# Patient Record
Sex: Male | Born: 1950
Health system: Southern US, Community
[De-identification: ages and names within clinical notes are randomized; demographics above are authoritative.]

## PROBLEM LIST (undated history)

## (undated) DIAGNOSIS — I428 Other cardiomyopathies: Secondary | ICD-10-CM

## (undated) DIAGNOSIS — R42 Dizziness and giddiness: Secondary | ICD-10-CM

## (undated) DIAGNOSIS — E119 Type 2 diabetes mellitus without complications: Secondary | ICD-10-CM

## (undated) DIAGNOSIS — E7800 Pure hypercholesterolemia, unspecified: Secondary | ICD-10-CM

## (undated) DIAGNOSIS — R011 Cardiac murmur, unspecified: Secondary | ICD-10-CM

## (undated) DIAGNOSIS — G709 Myoneural disorder, unspecified: Secondary | ICD-10-CM

## (undated) DIAGNOSIS — Z974 Presence of external hearing-aid: Secondary | ICD-10-CM

## (undated) DIAGNOSIS — Z87442 Personal history of urinary calculi: Secondary | ICD-10-CM

## (undated) DIAGNOSIS — N23 Unspecified renal colic: Secondary | ICD-10-CM

## (undated) DIAGNOSIS — I509 Heart failure, unspecified: Secondary | ICD-10-CM

## (undated) DIAGNOSIS — K219 Gastro-esophageal reflux disease without esophagitis: Secondary | ICD-10-CM

## (undated) DIAGNOSIS — K602 Anal fissure, unspecified: Secondary | ICD-10-CM

## (undated) DIAGNOSIS — G629 Polyneuropathy, unspecified: Secondary | ICD-10-CM

## (undated) DIAGNOSIS — N200 Calculus of kidney: Secondary | ICD-10-CM

## (undated) DIAGNOSIS — K279 Peptic ulcer, site unspecified, unspecified as acute or chronic, without hemorrhage or perforation: Secondary | ICD-10-CM

## (undated) DIAGNOSIS — I219 Acute myocardial infarction, unspecified: Secondary | ICD-10-CM

## (undated) DIAGNOSIS — J189 Pneumonia, unspecified organism: Secondary | ICD-10-CM

## (undated) DIAGNOSIS — C9241 Acute promyelocytic leukemia, in remission: Secondary | ICD-10-CM

## (undated) DIAGNOSIS — I2510 Atherosclerotic heart disease of native coronary artery without angina pectoris: Secondary | ICD-10-CM

## (undated) DIAGNOSIS — Z973 Presence of spectacles and contact lenses: Secondary | ICD-10-CM

## (undated) DIAGNOSIS — T884XXA Failed or difficult intubation, initial encounter: Secondary | ICD-10-CM

## (undated) HISTORY — PX: SHOULDER SURGERY: SHX246

## (undated) HISTORY — DX: Anal fissure, unspecified: K60.2

## (undated) HISTORY — DX: Type 2 diabetes mellitus without complications: E11.9

## (undated) HISTORY — PX: ANAL FISSURE REPAIR: SHX2312

## (undated) HISTORY — PX: LUMBAR FUSION: SHX111

## (undated) HISTORY — DX: Peptic ulcer, site unspecified, unspecified as acute or chronic, without hemorrhage or perforation: K27.9

## (undated) HISTORY — DX: Calculus of kidney: N20.0

## (undated) HISTORY — PX: APPENDECTOMY: SHX54

## (undated) HISTORY — PX: TONSILLECTOMY: SUR1361

## (undated) HISTORY — PX: CHOLECYSTECTOMY: SHX55

## (undated) HISTORY — DX: Gastro-esophageal reflux disease without esophagitis: K21.9

## (undated) HISTORY — DX: Unspecified renal colic: N23

## (undated) HISTORY — PX: CERVICAL FUSION: SHX112

## (undated) HISTORY — PX: KNEE SURGERY: SHX244

## (undated) HISTORY — PX: CATARACT EXTRACTION, BILATERAL: SHX1313

---

## 2007-01-08 ENCOUNTER — Ambulatory Visit (HOSPITAL_COMMUNITY): Admission: RE | Admit: 2007-01-08 | Discharge: 2007-01-08 | Payer: Self-pay | Admitting: Orthopedic Surgery

## 2008-06-28 ENCOUNTER — Ambulatory Visit (HOSPITAL_COMMUNITY): Admission: RE | Admit: 2008-06-28 | Discharge: 2008-06-28 | Payer: Self-pay | Admitting: Orthopedic Surgery

## 2009-01-02 ENCOUNTER — Encounter: Payer: Self-pay | Admitting: Cardiology

## 2009-01-04 ENCOUNTER — Encounter: Payer: Self-pay | Admitting: Cardiology

## 2009-04-20 ENCOUNTER — Ambulatory Visit (HOSPITAL_COMMUNITY): Admission: RE | Admit: 2009-04-20 | Discharge: 2009-04-20 | Payer: Self-pay | Admitting: Orthopedic Surgery

## 2009-11-14 ENCOUNTER — Encounter: Payer: Self-pay | Admitting: Cardiology

## 2009-11-14 ENCOUNTER — Ambulatory Visit: Payer: Self-pay | Admitting: Cardiology

## 2009-11-15 ENCOUNTER — Encounter: Payer: Self-pay | Admitting: Cardiology

## 2009-11-26 ENCOUNTER — Encounter: Payer: Self-pay | Admitting: Cardiology

## 2009-12-04 ENCOUNTER — Encounter: Payer: Self-pay | Admitting: Cardiology

## 2010-01-13 ENCOUNTER — Encounter: Payer: Self-pay | Admitting: Cardiology

## 2010-01-13 DIAGNOSIS — I10 Essential (primary) hypertension: Secondary | ICD-10-CM | POA: Insufficient documentation

## 2010-01-13 DIAGNOSIS — R079 Chest pain, unspecified: Secondary | ICD-10-CM | POA: Insufficient documentation

## 2010-01-14 ENCOUNTER — Ambulatory Visit: Payer: Self-pay | Admitting: Cardiology

## 2010-01-14 DIAGNOSIS — Z87891 Personal history of nicotine dependence: Secondary | ICD-10-CM | POA: Insufficient documentation

## 2010-09-10 NOTE — Miscellaneous (Signed)
  Clinical Lists Changes  Observations: Added new observation of CARDIO HPI: See hospital consult note and D/C summary [place]. His symptoms were GI. But, he is diabetic . Echo questions slight anterior wall abnormality. I will see him in office to follow further. (11/15/2009 15:28)      History of Present Illness: See hospital consult note and D/C summary [place]. His symptoms were GI. But, he is diabetic . Echo questions slight anterior wall abnormality. I will see him in office to follow further.

## 2010-09-10 NOTE — Miscellaneous (Signed)
  Clinical Lists Changes  Problems: Added new problem of HYPERTENSION (ICD-401.9) Added new problem of CHEST PAIN (ICD-786.50) Observations: Added new observation of PAST MED HX: Hypertension Annal fissure repair Lumbar fusion Ureteral colic Chest pain...[facility]..11/1999...mild amylase/lipase elevation...symptoms GI EF  55%..echo...11/14/2009...slight relative hypokinesis of the anterior wall. ?  prior outside echo 2010 reported as normal motion Nuclear stress.Marland KitchenMarland KitchenVyas office.Marland KitchenMarland Kitchen5/27/10...normal..  Shelva Majestic) (01/13/2010 13:57) Added new observation of PRIMARY MD: Doreen Beam, MD (01/13/2010 13:57)       Past History:  Past Medical History: Hypertension Annal fissure repair Lumbar fusion Ureteral colic Chest pain...[facility]..11/1999...mild amylase/lipase elevation...symptoms GI EF  55%..echo...11/14/2009...slight relative hypokinesis of the anterior wall. ?  prior outside echo 2010 reported as normal motion Nuclear stress.Marland KitchenMarland KitchenVyas office.Marland KitchenMarland Kitchen5/27/10...normal..  ([place])

## 2010-09-10 NOTE — Assessment & Plan Note (Signed)
Summary: eph-post [facility] 2 mo fu per Myrtis Ser   Visit Type:  hospital follow-up Primary Provider:  Doreen Beam, MD  CC:  chest faint.  History of Present Illness: The patient is seen for followup chest pain.  I have seen him in the hospital November 14, 2009.  At that time he had some chest discomfort and indigestion.  He had slight amylase rise.  He did not have definite gallstones.  However it turns out that he did have an abnormal HIDA scan and eventually had a cholecystectomy.  He feels fine has had no recurrent symptoms..  I was aware at the time of the consultation that he had an echo and nuclear studies in Dr.Vyas's office in the past. I received copies of these studies.  In May 2010 he had a normal echo and a normal nuclear stress study.  In the hospital recently I questioned whether he can have very slight relative anterior hypokinesis.  This was a borderline call.  Preventive Screening-Counseling & Management  Alcohol-Tobacco     Smoking Status: quit     Year Quit: 1987  Current Medications (verified): 1)  Metformin Hcl 500 Mg Tabs (Metformin Hcl) .... Take Two By Mouth in Morning and On in Evening 2)  Colace 100 Mg Caps (Docusate Sodium) .... Take 1 Tablet By Mouth Once A Day  Allergies (verified): 1)  ! Claritin  Comments:  Nurse/Medical Assistant: The patient's medications and allergies were verabally reviewed with the patient and were updated in the Medication and Allergy Lists.  Past History:  Past Medical History: Hypertension Annal fissure repair Lumbar fusion Ureteral colic Chest pain...[facility]..11/1999...mild amylase/lipase elevation...symptoms GI EF  echo.... May, 2010.Marland KitchenMarland KitchenMarland KitchenVyas's office.. normal  /     55%..echo...11/14/2009...slight relative hypokinesis of the anterior wall. Nuclear stress.Marland KitchenMarland KitchenVyas office.Marland KitchenMarland Kitchen5/27/10...normal..  ([place])    Social History: Smoking Status:  quit  Review of Systems       Patient denies fever, chills, headache, sweats, rash, change in vision,  change in hearing, chest pain, cough, nausea vomiting, urinary symptoms.  All other systems are reviewed and are negative.  Vital Signs:  Patient profile:   60 year old male Height:      69 inches Weight:      220 pounds BMI:     32.61 Pulse rate:   102 / minute BP sitting:   159 / 94  (left arm) Cuff size:   large  Vitals Entered By: Carlye Grippe (January 14, 2010 1:08 PM)  Nutrition Counseling: Patient's BMI is greater than 25 and therefore counseled on weight management options.   Physical Exam  General:  patient is stable. Eyes:  no xanthelasma. Neck:  no jugular venous distention. Lungs:  lungs are clear.  Respiratory effort is nonlabored. Heart:  cardiac exam reveals S1-S2.  No clicks or significant murmurs. Abdomen:  abdomen soft. Msk:  no musculoskeletal qualities. Extremities:  no peripheral edema. Skin:  no skin rashes. Psych:  patient is oriented to person time and place.  Affect is normal.   Impression & Recommendations:  Problem # 1:  CHEST PAIN (ICD-786.50) The symptoms that the patient had were related to his gallbladder.  He is quite stable now.  I have reviewed his prior echo and nuclear reports from 2011.  The echo in the hospital only suggested a slim possibility of relative anterior hypokinesis.  I feel he does not need any further workup now.  I do plan to see him back in one year for followup.  If he develops  any recurrent symptoms more aggressive evaluation will be needed.  Problem # 2:  * CHOLECYSTECTOMY... APRIL, 2011 The patient did have his gallbladder surgery and he feels much better.  Problem # 3:  HYPERTENSION (ICD-401.9) Blood pressure is elevated today.  Patient rushed to get here.  He has a blood pressure cuff at home.  At home usually his pressure is under good control.  He will check his pressure several more times at home.  If there is any elevation he will be back in touch with me or Dr.Vyas.  Patient Instructions: 1)  Your physician  wants you to follow-up in: 1 year. You will receive a reminder letter in the mail one-two months in advance. If you don't receive a letter, please call our office to schedule the follow-up appointment. 2)  Your physician recommends that you continue on your current medications as directed. Please refer to the Current Medication list given to you today.

## 2010-09-13 NOTE — Consult Note (Signed)
Summary: CARDIOLOGY CONSULT/ [facility]  CARDIOLOGY CONSULT/ [facility]   Imported By: Zachary George 01/11/2010 09:58:23  _____________________________________________________________________  External Attachment:    Type:   Image     Comment:   External Document

## 2010-09-13 NOTE — Letter (Signed)
Summary: [facility] D/C DR. VYAS  [facility] D/C DR. [facility]   Imported By: Zachary George 01/14/2010 10:33:13  _____________________________________________________________________  External Attachment:    Type:   Image     Comment:   External Document

## 2010-11-15 LAB — COMPREHENSIVE METABOLIC PANEL
ALT: 26 U/L (ref 0–53)
AST: 18 U/L (ref 0–37)
Albumin: 4.4 g/dL (ref 3.5–5.2)
Alkaline Phosphatase: 61 U/L (ref 39–117)
BUN: 11 mg/dL (ref 6–23)
CO2: 24 mEq/L (ref 19–32)
Calcium: 9.7 mg/dL (ref 8.4–10.5)
Chloride: 110 mEq/L (ref 96–112)
Creatinine, Ser: 0.81 mg/dL (ref 0.4–1.5)
GFR calc Af Amer: >60 mL/min (ref >60)
GFR calc non Af Amer: >60 mL/min (ref >60)
Glucose, Bld: 114 mg/dL — ABNORMAL HIGH (ref 70–99)
Potassium: 4.9 mEq/L (ref 3.5–5.1)
Sodium: 140 mEq/L (ref 135–145)
Total Bilirubin: 0.7 mg/dL (ref 0.3–1.2)
Total Protein: 6.8 g/dL (ref 6.0–8.3)

## 2010-11-15 LAB — CBC
HCT: 46.4 % (ref 39.0–52.0)
Hemoglobin: 16.3 g/dL (ref 13.0–17.0)
MCHC: 35.1 g/dL (ref 30.0–36.0)
MCV: 95.7 fL (ref 78.0–100.0)
Platelets: 260 10*3/uL (ref 150–400)
RBC: 4.85 MIL/uL (ref 4.22–5.81)
RDW: 12.8 % (ref 11.5–15.5)
WBC: 7.3 10*3/uL (ref 4.0–10.5)

## 2010-11-15 LAB — GLUCOSE, CAPILLARY
Glucose-Capillary: 145 mg/dL — ABNORMAL HIGH (ref 70–99)
Glucose-Capillary: 147 mg/dL — ABNORMAL HIGH (ref 70–99)

## 2010-12-24 NOTE — Op Note (Signed)
NAME:  Ricardo Zuniga, Ricardo Zuniga              ACCOUNT NO.:  [id number]   MEDICAL RECORD NO.:  [id number]          PATIENT TYPE:  AMB   LOCATION:  SDS                          FACILITY:  [facility]   PHYSICIAN:  Dyke Brackett, M.D.    DATE OF BIRTH:  [birth year]   DATE OF PROCEDURE:  06/28/2008  DATE OF DISCHARGE:  06/28/2008                               OPERATIVE REPORT   INDICATIONS:  This is a 60 year old injured on the job with a work-  related injury to his left shoulder with a complete rotator cuff tear  thought to be amenable to start outpatient surgery in this patient's  setting due to difficulty intubation.   PREOPERATIVE DIAGNOSES:  1. Complete supraspinatus full thickness, full width tear.  2. Impingement.  3. Acromioclavicular joint arthritis.  4. Degenerative tearing of anterosuperior labrum.   POSTOPERATIVE DIAGNOSES:  1. Complete supraspinatus full thickness, full width tear.  2. Impingement.  3. Acromioclavicular joint arthritis.  4. Degenerative tearing of anterosuperior labrum.   OPERATION:  1. Open rotator cuff repair, acromioplasty.  2. Arthroscopic debridement of torn labrum.  3. Open distal clavicle excision.   SURGEON:  Dyke Brackett, MD   ASSISTANTKatrinka Blazing, PA   BLOOD LOSS:  Minimal.   DESCRIPTION OF PROCEDURE:  Sterile prep and drape, general anesthetic  with the preoperative supraclavicular block, posterior and anterior  portals were created.  Intraarticularly, he did actually have some  degenerative change of the glenohumeral joint mild, just debrided.  Labrum degenerative tearing anterosuperior and anteroinferior labrum  debrided.  This was done arthroscopically.  Large rotator cuff tear was  identified.  Biceps tendon was difficult to identify its origin into the  labrum, but there was a biceps tendon at least visible once we opened  the shoulder.  Procedure was next converted to open procedure.  Incision  made bisecting the acromial AC interval,  degenerative distal clavicle  excised over about a centimeter distally, acromioplasty carried out,  sectioned about 6-7 mm of the anterior leading edge of the acromion  revealing a very thickened hypertrophied bursa, which was excised.  Complete cuff tear noted with relatively healthy tissue.  Debridement of  the edges of the tissue was carried out followed by burring of the bony  surfaces inserting a 2 Arthrex 5.5 corkscrew anchors for a total of 4  sutures repair of the cuff  tear.  Since the watertight repair was obtained, closure of the deltoid  was repaired with #1 Ethibond, 0 Vicryl in subcutaneous tissues, 2-0  Vicryl, brace already done, a mini pillow, sling splint.  Taken to  recovery room in stable condition.      Dyke Brackett, M.D.  Electronically Signed     WDC/MEDQ  D:  06/28/2008  T:  06/29/2008  Job:  [id number]

## 2010-12-24 NOTE — Op Note (Signed)
NAME:  RYDELL, WIEGEL              ACCOUNT NO.:  [id number]   MEDICAL RECORD NO.:  [id number]          PATIENT TYPE:  AMB   LOCATION:  SDS                          FACILITY:  [facility]   PHYSICIAN:  Dyke Brackett, M.D.    DATE OF BIRTH:  1951/05/06   DATE OF PROCEDURE:  DATE OF DISCHARGE:                               OPERATIVE REPORT   Audio too short to transcribe (less than 5 seconds)      Dyke Brackett, M.D.     WDC/MEDQ  D:  01/08/2007  T:  01/08/2007  Job:  [id number]

## 2010-12-24 NOTE — Op Note (Signed)
NAME:  ATREUS, HASZ              ACCOUNT NO.:  [id number]   MEDICAL RECORD NO.:  [id number]          PATIENT TYPE:  AMB   LOCATION:  SDS                          FACILITY:  [facility]   PHYSICIAN:  Dyke Brackett, M.D.    DATE OF BIRTH:  August 09, 1951   DATE OF PROCEDURE:  01/08/2007  DATE OF DISCHARGE:                               OPERATIVE REPORT   PREOPERATIVE DIAGNOSIS:  Rotator cuff tear.   POSTOPERATIVE DIAGNOSES:  1. Partial rotator cuff tear.  2. Labral tear.  3. Partial biceps tendon tear.  4. Impingement.  5. Acromioclavicular joint arthritis.   OPERATION:  1. Arthroscopic acromioplasty.  2. Arthroscopic debridement of torn labrum.  3. Arthroscopic incision of distal clavicle.   SURGEON:  Dyke Brackett, M.D.   ASSISTANT:  Arlys John D. Petrarca, P.A.-C.   ANESTHESIA:  General (awake intubation).   BLOOD LOSS:  Minimal.   INDICATIONS:  This is a 60 year old, injured on the job with a resultant  painful right shoulder.  MRI suggested a complete cuff tear, thought to  be amenable to outpatient, possible overnight.  He was transferred from  originally being done at the surgery center, where intubation was not  possible, to an inpatient hospital setting due to the fact that he could  not be intubated as an outpatient.   DESCRIPTION OF PROCEDURE:  Awake intubation was performed  fiberoptically.  After that the patient was prepped in the beach-chair  position.  He was arthroscoped through a posterolateral and anterior  superior portal.  Intra-articularly, he had significant tearing of the  anterior superior labrum, which was debrided.  This was not a tear  amenable to repair.  He had a partial biceps tendon tear under the  undersurface of the biceps, and probably the majority of the intra-  articular length of the biceps, which was debrided, estimated to be  about 30% of the diameter of the biceps.  The rotator cuff showed a lot  of tendinosis, but no frank tear was  appreciated on the undersurface, or  superiorly above in the bursa.  The bursa was hypertrophied and  thickened with moderate impingement from the CA ligament and the  acromion, but a lot of impingement from the clavicle.  The superior  surface of the cuff was probed and seen to be intact.  Complete  bursectomy was carried out.  Resection of the distal 1 cm to 1.5 cm of  the distal clavicle with an acromioplasty was carried out.  Complete  bursectomy was carried out.  This relieved the  impingement nicely.  No full-thickness tear appreciated.  The portals  were closed with nylon, and the shoulder infiltrated with Marcaine 20 to  30 mL into the portals, as well as intra-articularly in the joint.  A  lightly compressive sterile dressing and sling applied.  Taken to  recovery room in stable condition.      Dyke Brackett, M.D.  Electronically Signed     WDC/MEDQ  D:  01/08/2007  T:  01/08/2007  Job:  [id number]

## 2010-12-24 NOTE — Op Note (Signed)
NAME:  Ricardo Zuniga, Ricardo Zuniga              ACCOUNT NO.:  [id number]   MEDICAL RECORD NO.:  [id number]          PATIENT TYPE:  AMB   LOCATION:  SDS                          FACILITY:  [facility]   PHYSICIAN:  Dyke Brackett, M.D.    DATE OF BIRTH:  1950/11/23   DATE OF PROCEDURE:  DATE OF DISCHARGE:                               OPERATIVE REPORT   Audio too short to transcribe (less than 5 seconds)      Dyke Brackett, M.D.     WDC/MEDQ  D:  01/08/2007  T:  01/08/2007  Job:  [id number]

## 2011-05-13 LAB — CBC
HCT: 47.7
Hemoglobin: 16.6
MCHC: 34.8
MCV: 94.6
Platelets: 280
RBC: 5.04
RDW: 12.9
WBC: 7.4

## 2011-05-13 LAB — APTT: aPTT: 25

## 2011-05-13 LAB — DIFFERENTIAL
Basophils Absolute: 0
Basophils Relative: 1
Eosinophils Absolute: 0.1
Eosinophils Relative: 1
Lymphocytes Relative: 25
Lymphs Abs: 1.8
Monocytes Absolute: 0.6
Monocytes Relative: 8
Neutro Abs: 4.9
Neutrophils Relative %: 66

## 2011-05-13 LAB — PROTIME-INR
INR: 1.1
Prothrombin Time: 14.5

## 2012-02-05 ENCOUNTER — Encounter: Payer: Self-pay | Admitting: *Deleted

## 2012-06-14 ENCOUNTER — Encounter: Payer: Self-pay | Admitting: Cardiology

## 2012-07-01 ENCOUNTER — Encounter: Payer: Self-pay | Admitting: *Deleted

## 2012-07-01 ENCOUNTER — Ambulatory Visit (INDEPENDENT_AMBULATORY_CARE_PROVIDER_SITE_OTHER): Payer: BC Managed Care – PPO | Admitting: Cardiology

## 2012-07-01 ENCOUNTER — Encounter: Payer: Self-pay | Admitting: Cardiology

## 2012-07-01 VITALS — BP 145/94 | HR 107 | Ht 69.0 in | Wt 224.0 lb

## 2012-07-01 DIAGNOSIS — R0609 Other forms of dyspnea: Secondary | ICD-10-CM

## 2012-07-01 DIAGNOSIS — E785 Hyperlipidemia, unspecified: Secondary | ICD-10-CM | POA: Insufficient documentation

## 2012-07-01 DIAGNOSIS — I351 Nonrheumatic aortic (valve) insufficiency: Secondary | ICD-10-CM | POA: Insufficient documentation

## 2012-07-01 DIAGNOSIS — R0989 Other specified symptoms and signs involving the circulatory and respiratory systems: Secondary | ICD-10-CM

## 2012-07-01 DIAGNOSIS — R0600 Dyspnea, unspecified: Secondary | ICD-10-CM

## 2012-07-01 DIAGNOSIS — I10 Essential (primary) hypertension: Secondary | ICD-10-CM

## 2012-07-01 DIAGNOSIS — R9389 Abnormal findings on diagnostic imaging of other specified body structures: Secondary | ICD-10-CM

## 2012-07-01 DIAGNOSIS — I359 Nonrheumatic aortic valve disorder, unspecified: Secondary | ICD-10-CM

## 2012-07-01 DIAGNOSIS — R931 Abnormal findings on diagnostic imaging of heart and coronary circulation: Secondary | ICD-10-CM

## 2012-07-01 NOTE — Progress Notes (Signed)
  HPI: 61 year-old male for evaluation of abnormal echocardiogram. Last seen by Dr Myrtis Ser 2011. Myoview in 2010 showed an ejection fraction of 46% and normal perfusion. Recent echocardiogram in October of 2013 showed an ejection fraction of 50%, possible inferior posterior and apical hypokinesis, moderate aortic insufficiency and mild mitral regurgitation. We were asked to evaluate for the abnormal echo. The patient has dyspnea with more extreme activities but not routine activities. There is no orthopnea, PND, pedal edema, chest pain, palpitations, syncope or claudication.  Current Outpatient Prescriptions  Medication Sig Dispense Refill  . amoxicillin (AMOXIL) 500 MG tablet Take 500 mg by mouth 2 (two) times daily. Will finish tomorrow.      Marland Kitchen atorvastatin (LIPITOR) 10 MG tablet Take 10 mg by mouth every evening.      Marland Kitchen dexlansoprazole (DEXILANT) 60 MG capsule Take 60 mg by mouth daily.      Marland Kitchen docusate sodium (COLACE) 100 MG capsule Take 100 mg by mouth 2 (two) times daily.      Marland Kitchen glimepiride (AMARYL) 4 MG tablet Take 4 mg by mouth daily.      . Linagliptin-Metformin HCl (JENTADUETO) 2.5-500 MG TABS Take 2 tabs every morning & 1 tab every evening        Allergies  Allergen Reactions  . Loratadine     Past Medical History  Diagnosis Date  . Hypertension   . Anal fissure   . Ureteral colic   . Chest pain, unspecified 11/1999    [facility]  . Nephrolithiasis   . Gastroesophageal reflux disease   . Peptic ulcer disease     Past Surgical History  Procedure Date  . Anal fissure repair   . Lumbar fusion   . Cholecystectomy     History   Social History  . Marital Status: Married    Spouse Name: N/A    Number of Children: N/A  . Years of Education: N/A   Occupational History  . RETIRED     [name]  . SECURITY GUARD    Social History Main Topics  . Smoking status: Former Smoker    Quit date: 08/11/1985  . Smokeless tobacco: Not on file  . Alcohol Use: No  . Drug Use: No    . Sexually Active: Not on file   Other Topics Concern  . Not on file   Social History Narrative  . No narrative on file    No family history on file.  ROS: no fevers or chills, productive cough, hemoptysis, dysphasia, odynophagia, melena, hematochezia, dysuria, hematuria, rash, seizure activity, orthopnea, PND, pedal edema, claudication. Remaining systems are negative.  Physical Exam:   Blood pressure 145/94, pulse 107, height 5\' 9"  (1.753 m), weight 224 lb (101.606 kg), SpO2 97.00%.  General:  Well developed/well nourished in NAD Skin warm/dry Patient not depressed No peripheral clubbing Back-normal HEENT-normal/normal eyelids Neck supple/normal carotid upstroke bilaterally; no bruits; no JVD; no thyromegaly chest - CTA/ normal expansion CV - RRR/normal S1 and S2; no murmurs, rubs or gallops;  PMI nondisplaced Abdomen -NT/ND, no HSM, no mass, + bowel sounds, no bruit 2+ femoral pulses, no bruits Ext-no edema, chords, 2+ DP Neuro-grossly nonfocal  ECG sinus rhythm at a rate of 99. No ST changes.

## 2012-07-01 NOTE — Patient Instructions (Signed)
Continue all current medications. Stress Myoview - [place] Office will contact with results Your physician wants you to follow up in:  1 year.  You will receive a reminder letter in the mail one-two months in advance.  If you don't receive a letter, please call our office to schedule the follow up appointment

## 2012-07-01 NOTE — Assessment & Plan Note (Signed)
Patient's blood pressure is elevated but he states typically normal at home. We will follow this closely and add either an ACE inhibitor or ARB if his blood pressure remains elevated. I have asked him to also discuss this with his primary care physician as he would likely benefit from these medications given his diabetes mellitus.

## 2012-07-01 NOTE — Assessment & Plan Note (Signed)
Continue statin. 

## 2012-07-01 NOTE — Assessment & Plan Note (Signed)
Patient is noted to have possible wall motion abnormality on echocardiogram. Plan to proceed with stress Myoview for risk stratification and to exclude coronary disease. Note he is diabetic and has dyspnea with more extreme activities.

## 2012-07-01 NOTE — Assessment & Plan Note (Signed)
Plan followup echocardiogram in one year. 

## 2012-07-02 ENCOUNTER — Other Ambulatory Visit: Payer: Self-pay | Admitting: *Deleted

## 2012-07-02 DIAGNOSIS — R079 Chest pain, unspecified: Secondary | ICD-10-CM

## 2012-07-02 DIAGNOSIS — R931 Abnormal findings on diagnostic imaging of heart and coronary circulation: Secondary | ICD-10-CM

## 2012-07-13 ENCOUNTER — Ambulatory Visit (HOSPITAL_COMMUNITY): Payer: BC Managed Care – PPO | Attending: Cardiovascular Disease | Admitting: Radiology

## 2012-07-13 VITALS — BP 122/73 | Ht 69.0 in | Wt 218.0 lb

## 2012-07-13 DIAGNOSIS — R0600 Dyspnea, unspecified: Secondary | ICD-10-CM

## 2012-07-13 DIAGNOSIS — R0609 Other forms of dyspnea: Secondary | ICD-10-CM | POA: Insufficient documentation

## 2012-07-13 DIAGNOSIS — I4949 Other premature depolarization: Secondary | ICD-10-CM

## 2012-07-13 DIAGNOSIS — R0989 Other specified symptoms and signs involving the circulatory and respiratory systems: Secondary | ICD-10-CM | POA: Insufficient documentation

## 2012-07-13 DIAGNOSIS — R079 Chest pain, unspecified: Secondary | ICD-10-CM

## 2012-07-13 DIAGNOSIS — R9431 Abnormal electrocardiogram [ECG] [EKG]: Secondary | ICD-10-CM

## 2012-07-13 DIAGNOSIS — R931 Abnormal findings on diagnostic imaging of heart and coronary circulation: Secondary | ICD-10-CM

## 2012-07-13 DIAGNOSIS — I10 Essential (primary) hypertension: Secondary | ICD-10-CM | POA: Insufficient documentation

## 2012-07-13 DIAGNOSIS — R002 Palpitations: Secondary | ICD-10-CM | POA: Insufficient documentation

## 2012-07-13 DIAGNOSIS — E119 Type 2 diabetes mellitus without complications: Secondary | ICD-10-CM | POA: Insufficient documentation

## 2012-07-13 MED ORDER — TECHNETIUM TC 99M SESTAMIBI GENERIC - CARDIOLITE
33.0000 | Freq: Once | INTRAVENOUS | Status: AC | PRN
  Administered 2012-07-13: 33 via INTRAVENOUS

## 2012-07-13 MED ORDER — TECHNETIUM TC 99M SESTAMIBI GENERIC - CARDIOLITE
10.8000 | Freq: Once | INTRAVENOUS | Status: AC | PRN
  Administered 2012-07-13: 11 via INTRAVENOUS

## 2012-07-13 NOTE — Progress Notes (Signed)
[facility] SITE 3 NUCLEAR MED [id number] [place] [address] [phone]  Cardiology Nuclear Med Study  Ricardo Zuniga is a 61 y.o. male     MRN : [id number]     DOB: [birth year]  Procedure Date: 07/13/2012  Nuclear Med Background Indication for Stress Test:  Evaluation for Ischemia and Abnormal Echocardiogram History:  Abnormal EKG, 2010 [facility]: EF: 46% NL [name], 05/2012 ECHO: EF: 50% AI possible inf  posterior and apical hypokinesis Cardiac Risk Factors: History of Smoking, Hypertension, Lipids and NIDDM  Symptoms:  DOE and Palpitations   Nuclear Pre-Procedure Caffeine/Decaff Intake:  None > 12 hrs NPO After: 6:00pm   Lungs:  clear O2 Sat: 97% on room air. IV 0.9% NS with Angio Cath:  22g  IV Site: R Antecubital x 1, tolerated well IV Started by:  Irean Hong, RN  Chest Size (in):  46 Cup Size: n/a  Height: 5\' 9"  (1.753 m)  Weight:  218 lb (98.884 kg)  BMI:  Body mass index is 32.19 kg/(m^2). Tech Comments:  No medication today    Nuclear Med Study 1 or 2 day study: 1 day  Stress Test Type:  Stress  Reading MD: Charlton Haws, MD  Order Authorizing Provider:  Laurice Record, MD  Resting Radionuclide: Technetium 2m Sestamibi  Resting Radionuclide Dose: 10.8 mCi   Stress Radionuclide:  Technetium 11m Sestamibi  Stress Radionuclide Dose: 33.0 mCi           Stress Protocol Rest HR: 91 Stress HR: 151  Rest BP: 122/73 Stress BP: 190/86  Exercise Time (min): 4:01 METS: 5.80   Predicted Max HR: 158 bpm % Max HR: 95.57 bpm Rate Pressure Product: 24401   Dose of Adenosine (mg):  n/a Dose of Lexiscan: n/a mg  Dose of Atropine (mg): n/a Dose of Dobutamine: n/a mcg/kg/min (at max HR)  Stress Test Technologist: Milana Na, EMT-P  Nuclear Technologist:  Domenic Polite, CNMT     Rest Procedure:  Myocardial perfusion imaging was performed at rest 45 minutes following the intravenous administration of Technetium 81m Sestamibi. Rest ECG:  NSR - Normal EKG  Stress Procedure:  The patient performed treadmill exercise using a Bruce  Protocol for 4:01 minutes. The patient stopped due to sob, fatigue, and denied any chest pain.  There were no significant ST-T wave changes.  Technetium 57m Sestamibi was injected at peak exercise and myocardial perfusion imaging was performed after a brief delay. Stress ECG: No significant change from baseline ECG  QPS Raw Data Images:  Normal; no motion artifact; normal heart/lung ratio. Stress Images:  Normal homogeneous uptake in all areas of the myocardium. Rest Images:  Normal homogeneous uptake in all areas of the myocardium. Subtraction (SDS):  Normal Transient Ischemic Dilatation (Normal <1.22):  1.05 Lung/Heart Ratio (Normal <0.45):  0.45  Quantitative Gated Spect Images QGS EDV:  158 ml QGS ESV:  109 ml  Impression Exercise Capacity:  Fair exercise capacity. BP Response:  Normal blood pressure response. Clinical Symptoms:  There is dyspnea. ECG Impression:  No significant ST segment change suggestive of ischemia. Comparison with Prior Nuclear Study: No images to compare  Overall Impression:  No ischemia or infarction.  LVE with diffuse hypokinesis EF 31%  LV Ejection Fraction: 31%.  LV Wall Motion:  Diffuse hypokinesis with moderate reduction    Charlton Haws

## 2012-07-15 ENCOUNTER — Telehealth: Payer: Self-pay | Admitting: *Deleted

## 2012-07-15 NOTE — Telephone Encounter (Signed)
Notes Recorded by Lesle Chris, LPN on 16/08/958 at 11:08 AM Patient notified. OV scheduled for 12/31 at 9:00 with Dr. Jens Som.

## 2012-07-15 NOTE — Telephone Encounter (Signed)
Message copied by Lesle Chris on Thu Jul 15, 2012 11:08 AM ------      Message from: MATHIS, DEBRA W      Created: Wed Jul 14, 2012  4:55 PM                   ----- Message -----         From: Lewayne Bunting, MD         Sent: 07/14/2012   9:30 AM           To: Deliah Goody, RN            Arrange fu ov      Olga Millers

## 2012-08-10 ENCOUNTER — Ambulatory Visit (INDEPENDENT_AMBULATORY_CARE_PROVIDER_SITE_OTHER): Payer: BC Managed Care – PPO | Admitting: Cardiology

## 2012-08-10 ENCOUNTER — Other Ambulatory Visit: Payer: Self-pay | Admitting: Cardiology

## 2012-08-10 ENCOUNTER — Encounter: Payer: Self-pay | Admitting: Cardiology

## 2012-08-10 ENCOUNTER — Encounter: Payer: Self-pay | Admitting: *Deleted

## 2012-08-10 ENCOUNTER — Telehealth: Payer: Self-pay | Admitting: Cardiology

## 2012-08-10 VITALS — BP 142/90 | HR 107 | Ht 69.0 in | Wt 217.0 lb

## 2012-08-10 DIAGNOSIS — Z01810 Encounter for preprocedural cardiovascular examination: Secondary | ICD-10-CM

## 2012-08-10 DIAGNOSIS — E785 Hyperlipidemia, unspecified: Secondary | ICD-10-CM

## 2012-08-10 DIAGNOSIS — R931 Abnormal findings on diagnostic imaging of heart and coronary circulation: Secondary | ICD-10-CM

## 2012-08-10 DIAGNOSIS — I420 Dilated cardiomyopathy: Secondary | ICD-10-CM | POA: Insufficient documentation

## 2012-08-10 DIAGNOSIS — R9389 Abnormal findings on diagnostic imaging of other specified body structures: Secondary | ICD-10-CM

## 2012-08-10 DIAGNOSIS — I351 Nonrheumatic aortic (valve) insufficiency: Secondary | ICD-10-CM

## 2012-08-10 DIAGNOSIS — I428 Other cardiomyopathies: Secondary | ICD-10-CM

## 2012-08-10 DIAGNOSIS — I10 Essential (primary) hypertension: Secondary | ICD-10-CM

## 2012-08-10 DIAGNOSIS — I359 Nonrheumatic aortic valve disorder, unspecified: Secondary | ICD-10-CM

## 2012-08-10 MED ORDER — ASPIRIN EC 81 MG PO TBEC: 81.0000 mg | DELAYED_RELEASE_TABLET | Freq: Every day | ORAL | Status: DC

## 2012-08-10 MED ORDER — LISINOPRIL 2.5 MG PO TABS: 2.5000 mg | ORAL_TABLET | Freq: Every day | ORAL | Status: DC

## 2012-08-10 NOTE — Progress Notes (Signed)
   HPI: 61 year-old male I initially saw in Nov 2013 for evaluation of abnormal echocardiogram. Last seen by Dr Myrtis Ser 2011. Myoview in 2010 showed an ejection fraction of 46% and normal perfusion. Recent echocardiogram in October of 2013 showed an ejection fraction of 50%, possible inferior posterior and apical hypokinesis, moderate aortic insufficiency and mild mitral regurgitation. We were asked to evaluate for the abnormal echo. Myoview repeated in Dec 2013 and showed EF 31 with normal perfusion. Since I last saw him, the patient has dyspnea with more extreme activities but not with routine activities. It is relieved with rest. It is not associated with chest pain. There is no orthopnea, PND or pedal edema. There is no syncope or palpitations. There is no exertional chest pain. He does note chest pain after eating.    Current Outpatient Prescriptions  Medication Sig Dispense Refill  . amoxicillin (AMOXIL) 500 MG tablet Take 500 mg by mouth 2 (two) times daily. Will finish tomorrow.      Marland Kitchen atorvastatin (LIPITOR) 10 MG tablet Take 10 mg by mouth every evening.      Marland Kitchen dexlansoprazole (DEXILANT) 60 MG capsule Take 60 mg by mouth daily.      Marland Kitchen docusate sodium (COLACE) 100 MG capsule Take 100 mg by mouth 2 (two) times daily.      Marland Kitchen glimepiride (AMARYL) 4 MG tablet Take 4 mg by mouth daily.      . Linagliptin-Metformin HCl (JENTADUETO) 2.5-500 MG TABS Take 2 tabs every morning & 1 tab every evening         Past Medical History  Diagnosis Date  . Hypertension   . Anal fissure   . Ureteral colic   . Nephrolithiasis   . Gastroesophageal reflux disease   . Peptic ulcer disease   . Diabetes mellitus     Past Surgical History  Procedure Date  . Anal fissure repair   . Lumbar fusion   . Cholecystectomy     History   Social History  . Marital Status: Married    Spouse Name: N/A    Number of Children: N/A  . Years of Education: N/A   Occupational History  . RETIRED     [name]    . SECURITY GUARD    Social History Main Topics  . Smoking status: Former Smoker    Quit date: 08/11/1985  . Smokeless tobacco: Never Used  . Alcohol Use: No  . Drug Use: No  . Sexually Active: Not on file   Other Topics Concern  . Not on file   Social History Narrative  . No narrative on file    ROS: no fevers or chills, productive cough, hemoptysis, dysphasia, odynophagia, melena, hematochezia, dysuria, hematuria, rash, seizure activity, orthopnea, PND, pedal edema, claudication. Remaining systems are negative.  Physical Exam: Well-developed well-nourished in no acute distress.  Skin is warm and dry.  HEENT is normal.  Neck is supple.  Chest is clear to auscultation with normal expansion.  Cardiovascular exam is regular rate and rhythm.  Abdominal exam nontender or distended. No masses palpated. Extremities show no edema. neuro grossly intact

## 2012-08-10 NOTE — Patient Instructions (Addendum)
   Begin Lisinopril 2.5mg  daily  Begin Aspirin 81mg  daily Continue all other current medications. Left JV heart cath - see info sheet given Follow up in [place] office in approximately 6 weeks

## 2012-08-10 NOTE — Telephone Encounter (Signed)
L JV heart cath - Friday, 1/10 at 7:30 [facility]  Checking percert

## 2012-08-10 NOTE — Assessment & Plan Note (Signed)
EF reduced on echo (EF 50) and myoview (EF 31); cath to exclude CAD; check TSH. Add lisinopril; may need beta blocker pending results of cath (EF and CAD). Add ASA 81 mg daily.

## 2012-08-10 NOTE — Assessment & Plan Note (Signed)
Repeat echo 10/14.

## 2012-08-10 NOTE — Assessment & Plan Note (Addendum)
Patient's echo shows mildly reduced LV function with possible wall motion abnormalities; myoview shows normal perfusion but reduced LV function; plan cath to exclude CAD as cause of cardiomyopathy (risks and benefits discussed and patient agrees to proceed). Hold glucophage for 48 hours following procedure. Check TSH.

## 2012-08-10 NOTE — Assessment & Plan Note (Signed)
Continue statin. 

## 2012-08-10 NOTE — Assessment & Plan Note (Signed)
BP is elevated; patient states normal at home; regardless, will begin lisinopril 2.5 mg daily, particularly in light of AI and DM; check K and renal function in one week.

## 2012-08-11 DIAGNOSIS — Z9289 Personal history of other medical treatment: Secondary | ICD-10-CM

## 2012-08-11 HISTORY — DX: Personal history of other medical treatment: Z92.89

## 2012-08-16 ENCOUNTER — Other Ambulatory Visit: Payer: Self-pay | Admitting: Cardiology

## 2012-08-17 ENCOUNTER — Telehealth: Payer: Self-pay | Admitting: *Deleted

## 2012-08-17 LAB — CBC
HCT: 41.9 % (ref 39.0–52.0)
Hemoglobin: 15.4 g/dL (ref 13.0–17.0)
MCH: 31.9 pg (ref 26.0–34.0)
MCHC: 36.8 g/dL — ABNORMAL HIGH (ref 30.0–36.0)
MCV: 86.7 fL (ref 78.0–100.0)
Platelets: 130 10*3/uL — ABNORMAL LOW (ref 150–400)
RBC: 4.83 MIL/uL (ref 4.22–5.81)
RDW: 14.2 % (ref 11.5–15.5)
WBC: 2 10*3/uL — ABNORMAL LOW (ref 4.0–10.5)

## 2012-08-17 LAB — BASIC METABOLIC PANEL
BUN: 9 mg/dL (ref 6–23)
CO2: 27 mEq/L (ref 19–32)
Calcium: 9.9 mg/dL (ref 8.4–10.5)
Chloride: 99 mEq/L (ref 96–112)
Creat: 0.77 mg/dL (ref 0.50–1.35)
Glucose, Bld: 138 mg/dL — ABNORMAL HIGH (ref 70–99)
Potassium: 4 mEq/L (ref 3.5–5.3)
Sodium: 140 mEq/L (ref 135–145)

## 2012-08-17 LAB — TSH: TSH: 0.692 u[IU]/mL (ref 0.350–4.500)

## 2012-08-17 LAB — APTT: aPTT: 29 seconds (ref 24–37)

## 2012-08-17 LAB — PROTIME-INR
INR: 1.17 (ref <1.50)
Prothrombin Time: 14.8 seconds (ref 11.6–15.2)

## 2012-08-17 NOTE — Telephone Encounter (Signed)
ERROR

## 2012-08-18 ENCOUNTER — Other Ambulatory Visit: Payer: Self-pay | Admitting: *Deleted

## 2012-08-18 ENCOUNTER — Telehealth: Payer: Self-pay | Admitting: *Deleted

## 2012-08-18 DIAGNOSIS — D72819 Decreased white blood cell count, unspecified: Secondary | ICD-10-CM

## 2012-08-18 DIAGNOSIS — Z01810 Encounter for preprocedural cardiovascular examination: Secondary | ICD-10-CM

## 2012-08-18 DIAGNOSIS — D696 Thrombocytopenia, unspecified: Secondary | ICD-10-CM

## 2012-08-18 LAB — CBC
HCT: 42 % (ref 39.0–52.0)
Hemoglobin: 15.5 g/dL (ref 13.0–17.0)
MCH: 32.6 pg (ref 26.0–34.0)
MCHC: 36.9 g/dL — ABNORMAL HIGH (ref 30.0–36.0)
MCV: 88.4 fL (ref 78.0–100.0)
Platelets: 142 10*3/uL — ABNORMAL LOW (ref 150–400)
RBC: 4.75 MIL/uL (ref 4.22–5.81)
RDW: 14.3 % (ref 11.5–15.5)
WBC: 2 10*3/uL — ABNORMAL LOW (ref 4.0–10.5)

## 2012-08-18 NOTE — Telephone Encounter (Signed)
New problem:   Wife calling regarding repeat lab work CBC done at [place] today. Receive a copy saying his test results.  Upcoming heart cath on Friday. Wife is concerning of lab results. Aware that Dr. Jens Som & his nurse Stanton Kidney are in [place] office today. Wife aware that they will be in the office tomorrow.  Wife requesting a call back today.

## 2012-08-18 NOTE — Telephone Encounter (Signed)
Patient informed. 

## 2012-08-18 NOTE — Telephone Encounter (Signed)
Message copied by Eustace Moore on Wed Aug 18, 2012 10:06 AM ------      Message from: Lewayne Bunting      Created: Tue Aug 17, 2012  4:00 PM       Repeat CBC prior to cath      Olga Millers            ----- Message -----         From: Lesle Chris, LPN         Sent: 08/17/2012   3:55 PM           To: Lewayne Bunting, MD            Do you want him to repeat this before his cath on Friday, 1/10

## 2012-08-18 NOTE — Telephone Encounter (Signed)
[name] pt will forward to angela hill rn in that office.

## 2012-08-18 NOTE — Telephone Encounter (Signed)
Discussed below with wife Erie Noe).  Advised her that repeat CBC is not back yet, but will notify them when results become available.  She verbalized understanding.

## 2012-08-18 NOTE — Telephone Encounter (Signed)
Left message for patient to call office.  

## 2012-08-19 ENCOUNTER — Telehealth: Payer: Self-pay | Admitting: *Deleted

## 2012-08-19 ENCOUNTER — Telehealth: Payer: Self-pay | Admitting: Hematology & Oncology

## 2012-08-19 ENCOUNTER — Other Ambulatory Visit: Payer: Self-pay | Admitting: Medical

## 2012-08-19 DIAGNOSIS — D709 Neutropenia, unspecified: Secondary | ICD-10-CM

## 2012-08-19 DIAGNOSIS — D696 Thrombocytopenia, unspecified: Secondary | ICD-10-CM

## 2012-08-19 NOTE — Telephone Encounter (Signed)
Pt aware of 08-20-12 appointment

## 2012-08-19 NOTE — Telephone Encounter (Signed)
Notes Recorded by Lesle Chris, LPN on 08/16/1094 at 9:20 AM Patient notified. Will put order in for referral to Hemetology and forward to [facility]) for scheduling. Notes Recorded by Lesle Chris, LPN on 0/11/5407 at 8:57 AM Call placed to cath lab [facility]) - cancelled for tomorrow. Notes Recorded by Lesle Chris, LPN on 03/11/1913 at 8:30 AM Is patient okay to move forward with cath tomorrow? Notes Recorded by Lewayne Bunting, MD on 08/19/2012 at 7:03 AM Hematology consult for neutropenia and thrombocytopenia Ricardo Zuniga

## 2012-08-19 NOTE — Telephone Encounter (Signed)
Message copied by Lesle Chris on Thu Aug 19, 2012  9:20 AM ------      Message from: Lewayne Bunting      Created: Thu Aug 19, 2012  8:32 AM       Probably better to see heme first and cancel cath for now; we can cath later.      Olga Millers            ----- Message -----         From: Lesle Chris, LPN         Sent: 08/19/2012   8:30 AM           To: Lewayne Bunting, MD            Is patient okay to move forward with cath tomorrow?

## 2012-08-20 ENCOUNTER — Ambulatory Visit: Payer: BC Managed Care – PPO

## 2012-08-20 ENCOUNTER — Ambulatory Visit (HOSPITAL_BASED_OUTPATIENT_CLINIC_OR_DEPARTMENT_OTHER): Payer: BC Managed Care – PPO | Admitting: Medical

## 2012-08-20 ENCOUNTER — Encounter (HOSPITAL_BASED_OUTPATIENT_CLINIC_OR_DEPARTMENT_OTHER): Admission: RE | Payer: Self-pay | Source: Ambulatory Visit

## 2012-08-20 ENCOUNTER — Other Ambulatory Visit (HOSPITAL_BASED_OUTPATIENT_CLINIC_OR_DEPARTMENT_OTHER): Payer: BC Managed Care – PPO | Admitting: Lab

## 2012-08-20 ENCOUNTER — Inpatient Hospital Stay (HOSPITAL_BASED_OUTPATIENT_CLINIC_OR_DEPARTMENT_OTHER)
Admission: RE | Admit: 2012-08-20 | Payer: BC Managed Care – PPO | Source: Ambulatory Visit | Admitting: Cardiovascular Disease

## 2012-08-20 VITALS — BP 141/69 | HR 111 | Temp 97.9°F | Resp 20 | Ht 69.0 in | Wt 214.0 lb

## 2012-08-20 DIAGNOSIS — D709 Neutropenia, unspecified: Secondary | ICD-10-CM

## 2012-08-20 DIAGNOSIS — D696 Thrombocytopenia, unspecified: Secondary | ICD-10-CM

## 2012-08-20 LAB — CBC WITH DIFFERENTIAL (CANCER CENTER ONLY)
BASO#: 0 10*3/uL (ref 0.0–0.2)
BASO%: 0.4 % (ref 0.0–2.0)
EOS%: 0.4 % (ref 0.0–7.0)
Eosinophils Absolute: 0 10*3/uL (ref 0.0–0.5)
HCT: 39.2 % (ref 38.7–49.9)
HGB: 14.8 g/dL (ref 13.0–17.1)
LYMPH#: 0.7 10*3/uL — ABNORMAL LOW (ref 0.9–3.3)
LYMPH%: 28.6 % (ref 14.0–48.0)
MCH: 32.8 pg (ref 28.0–33.4)
MCHC: 37.8 g/dL — ABNORMAL HIGH (ref 32.0–35.9)
MCV: 87 fL (ref 82–98)
MONO#: 0.2 10*3/uL (ref 0.1–0.9)
MONO%: 9.5 % (ref 0.0–13.0)
NEUT#: 1.4 10*3/uL — ABNORMAL LOW (ref 1.5–6.5)
NEUT%: 61.1 % (ref 40.0–80.0)
Platelets: 128 10*3/uL — ABNORMAL LOW (ref 145–400)
RBC: 4.51 10*6/uL (ref 4.20–5.70)
RDW: 13 % (ref 11.1–15.7)
WBC: 2.3 10*3/uL — ABNORMAL LOW (ref 4.0–10.0)

## 2012-08-20 LAB — CHCC SATELLITE - SMEAR

## 2012-08-20 LAB — TECHNOLOGIST REVIEW CHCC SATELLITE

## 2012-08-20 SURGERY — JV LEFT HEART CATHETERIZATION WITH CORONARY ANGIOGRAM
Anesthesia: Moderate Sedation

## 2012-08-20 NOTE — Progress Notes (Signed)
This office note has been dictated.

## 2012-08-20 NOTE — H&P (Signed)
[facility] NEW PATIENT EVALUATION   Name: Ricardo Zuniga Date: 08/20/2012 MRN: [id number] DOB: 22-Feb-1951    REFERRING PHYSICIAN: Olga Millers, M.D.  REASON FOR REFERRAL: Neutropenia/Thrombocytopenia    HISTORY OF PRESENT ILLNESS: Ricardo Zuniga is a 62 y.o. male who was kindly referred to [place] for further evaluation of his neutropenia and thrombocytopenia.  This is a pleasant, gentleman, who was recently found to have a white count of 2.0 and platelet count 142,000.  This was done on 08/19/2011.  Mr. Oaxaca was been seen by Dr. Jens Som.  It appears, that his echocardiogram showed mildly reduced left ventricular function with possible wall motion abnormalities.  Myoview shows normal perfusion, but reduced left ventricular function.  The plan was to go ahead and do a catheterization to exclude coronary artery disease as a cause of cardiomyopathy.  During Mr. Hershman preop lab work, it was noted that he had neutropenia and thrombocytopenia.  His catheterization was placed on hold.  the last CBC that I see in Epic was from 04/18/2009.  His white count was 7.3, hemoglobin 16.3, platelets 260,000.  Mr. Mccathern reports, that he has had a viral syndrome.  For the past 4-6 weeks.  He has been off and on antibiotics.  He, also reports, a recent case of ringworm.  He does not report any other recent or chronic infections.  He, reports, he has a good appetite.  He denies any nausea, vomiting, he does have intermittent constipation, and diarrhea, which has been chronic for him.  He does not report any, fevers, chills, or night sweats.  He denies any cough, chest pain, shortness of breath.  He denies any palpable adenopathy.  He denies any abdominal pain, any flank pain, any, bone pain.  He denies any headaches, visual changes, or rashes.  He denies any obvious, or abnormal bleeding or bruising.  He denies any excessive fatigue or weakness.  He is able to perform his activities of daily living  without any hindrance or decline.  He denies any unintentional weight loss.  His blood counts could possibly stem from medication or recent viral syndrome.  I think for the time being.  We will bring him back in 6 weeks and repeat.  His blood work.  From our point of view, it is okay for him to proceed with catheterization.    PAST MEDICAL HISTORY:  has a past medical history of Hypertension; Anal fissure; Ureteral colic; Nephrolithiasis; Gastroesophageal reflux disease; Peptic ulcer disease; and Diabetes mellitus.     PAST SURGICAL HISTORY: Past Surgical History  Procedure Date  . Anal fissure repair   . Lumbar fusion   . Cholecystectomy    ALLERGIES: Loratadine   SOCIAL HISTORY:  reports that he quit smoking about 27 years ago. He has never used smokeless tobacco. He reports that he does not drink alcohol or use illicit drugs. 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    FAMILY HISTORY: non-contributory.Marland Kitchen   LABORATORY DATA:   white count 2.3, hemoglobin 14.8, hematocrit 39.2, platelets 128,000, MCV 87  Peripheral smear  shows a well defined population of red blood cells. There is no  polychromasia. I do not see any nucleated red blood cells. There are  no schistocytes. He had no rouleaux formation. I saw no target cells.  There are no sickle cells. Hiw white cells appear normal in morphology  and maturation. He has no hypersegmented polys. There are no immature  myeloid or lymphoid forms.  I do not see any blasts. Platelets were well-  Granulated. He had a few scattered large platelets.  Current Outpatient Prescriptions on File Prior to Visit  Medication Sig Dispense Refill  . aspirin EC 81 MG tablet Take 1 tablet (81 mg total) by mouth daily.      Marland Kitchen atorvastatin (LIPITOR) 10 MG tablet Take 10 mg by mouth every evening.      Marland Kitchen dexlansoprazole (DEXILANT) 60 MG capsule Take 60 mg by mouth daily.      Marland Kitchen docusate sodium (COLACE) 100 MG  capsule Take 100 mg by mouth 2 (two) times daily.      Marland Kitchen glimepiride (AMARYL) 4 MG tablet Take 4 mg by mouth daily.      . Linagliptin-Metformin HCl (JENTADUETO) 2.5-500 MG TABS Take 2 tabs every morning & 1 tab every evening      . lisinopril (PRINIVIL,ZESTRIL) 2.5 MG tablet Take 1 tablet (2.5 mg total) by mouth daily.  30 tablet  6     Review of Systems: Constitutional:Negative for malaise/fatigue, fever, chills, weight loss, diaphoresis, activity change, appetite change, and unexpected weight change.  HEENT: Negative for double vision, blurred vision, visual loss, ear pain, tinnitus, congestion, rhinorrhea, epistaxis sore throat or sinus disease, oral pain/lesion, tongue soreness Respiratory: Negative for cough, chest tightness, shortness of breath, wheezing and stridor.  Cardiovascular: Negative for chest pain, palpitations, leg swelling, orthopnea, PND, DOE or claudication Gastrointestinal: Negative for nausea, vomiting, abdominal pain, diarrhea, constipation, blood in stool, melena, hematochezia, abdominal distention, anal bleeding, rectal pain, anorexia and hematemesis.  Genitourinary: Negative for dysuria, frequency, hematuria,  Musculoskeletal: Negative for myalgias, back pain, joint swelling, arthralgias and gait problem.  Skin: Negative for rash, color change, pallor and wound.  Neurological:. Negative for dizziness/light-headedness, tremors, seizures, syncope, facial asymmetry, speech difficulty, weakness, numbness, headaches and paresthesias.  Hematological: Negative for adenopathy. Does not bruise/bleed easily.  Psychiatric/Behavioral:  Negative for depression, no loss of interest in normal activity or change in sleep pattern.   Physical Exam: This is a pleasant, 62 year old, well-developed, well-nourished, white gentleman, in no obvious distress Vitals: temperature 97.9 degrees, pulse 111, respirations 20, blood pressure 141/69, weight 214 pounds  HEENT reveals a normocephalic,  atraumatic skull, no scleral icterus, no oral lesions  Neck is supple without any cervical or supraclavicular adenopathy.  Lungs are clear to auscultation bilaterally. There are no wheezes, rales or rhonci Cardiac is regular rate and rhythm with a normal S1 and S2. There are no murmurs, rubs, or bruits.  Abdomen is soft with good bowel sounds, there is no palpable mass. There is no palpable hepatosplenomegaly. There is no palpable fluid wave.  Musculoskeletal no tenderness of the spine, ribs, or hips.  Extremities there are no clubbing, cyanosis, or edema.  Skin no petechia, purpura or ecchymosis Neurologic is nonfocal.  Assessment/Plan: This is a 62 year old, white gentleman, with the following issues:  #1.  Neutropenia/thrombocytopenia.  His peripheral smear appeared normal.  There is no nucleated red blood cells.  There were no blast.  He did have a few scattered, large platelets.  He has had a viral syndrome.  For the past month, and has been on antibiotics.  This could be secondary to medications.  There does not appear to be any obvious , leukemia or lymphoma.  He does not report any chronic infections.  I do not think we need to do a bone marrow, biopsy.  At this point.  I think it is feasible to go  ahead with his catheterization.  We will like to bring him back in about 6 weeks to follow up with his blood work.     #2.  Followup.  Mr. Brunsman will follow back up with [place] in 6 weeks, but before then should there be questions or concerns.     The above assessment and plan was collaborated and agreed upon with Dr. Myna Hidalgo.

## 2012-08-24 ENCOUNTER — Telehealth: Payer: Self-pay | Admitting: *Deleted

## 2012-08-24 NOTE — Telephone Encounter (Signed)
Patient has had visit with Dr. Monika Salk for eval neutropenia/thrombocytopenia.  States they have give him the okay to proceed with heart cath.  (See OV note from Eunice Blase, [place])  Would like to reschedule this for first of next week if you are also okay for him to do so.

## 2012-08-24 NOTE — Telephone Encounter (Signed)
New patient evaluation from [facility] is under "notes" tab & then H & P.    Please advise on message below.

## 2012-08-24 NOTE — Telephone Encounter (Signed)
Mrs. Ricardo Zuniga today stating that [name] continues to cough . He states that he is going to quit taking Lisinopril 2.5 mg to see If he will stop coughing. Please advise.

## 2012-08-24 NOTE — Telephone Encounter (Signed)
Ok to DC lisinopril and begin diovan 80 mg po daily; ok to schedule cath next week; hold glucophage day of and  48 hours after procedure; hold amaryl day of procedure.

## 2012-08-24 NOTE — Telephone Encounter (Signed)
Ov not in computer (dictated); if ok with heme, ok to proceed with cath. Olga Millers

## 2012-08-25 MED ORDER — VALSARTAN 80 MG PO TABS: 80.0000 mg | ORAL_TABLET | Freq: Every day | ORAL | Status: DC

## 2012-08-25 NOTE — Telephone Encounter (Signed)
Ok to stay off lipitor for now [facility]

## 2012-08-25 NOTE — Telephone Encounter (Signed)
Notified wife Erie Noe).  States he has also stopped his Atorvastatin also.  States she has looked this up & it can also cause low platelets & low white cell count.  Unsure about starting anything new.  On cell phone & call dropped off.  Patient called right back.  Informed him also of medication change.  States he will go ahead with the Diovan.  Advised patient that message will be forwarded to [facility] for review.

## 2012-08-26 NOTE — Telephone Encounter (Signed)
Patient notified

## 2012-08-30 ENCOUNTER — Telehealth: Payer: Self-pay | Admitting: *Deleted

## 2012-08-30 DIAGNOSIS — Z01810 Encounter for preprocedural cardiovascular examination: Secondary | ICD-10-CM

## 2012-08-30 DIAGNOSIS — I351 Nonrheumatic aortic (valve) insufficiency: Secondary | ICD-10-CM

## 2012-08-30 DIAGNOSIS — I420 Dilated cardiomyopathy: Secondary | ICD-10-CM

## 2012-08-30 DIAGNOSIS — R931 Abnormal findings on diagnostic imaging of heart and coronary circulation: Secondary | ICD-10-CM

## 2012-08-30 LAB — BASIC METABOLIC PANEL
BUN: 11 mg/dL (ref 6–23)
CO2: 24 mEq/L (ref 19–32)
Calcium: 9.7 mg/dL (ref 8.4–10.5)
Chloride: 100 mEq/L (ref 96–112)
Creat: 0.82 mg/dL (ref 0.50–1.35)
Glucose, Bld: 228 mg/dL — ABNORMAL HIGH (ref 70–99)
Potassium: 4.1 mEq/L (ref 3.5–5.3)
Sodium: 135 mEq/L (ref 135–145)

## 2012-08-30 LAB — CBC
HCT: 38.7 % — ABNORMAL LOW (ref 39.0–52.0)
Hemoglobin: 14.2 g/dL (ref 13.0–17.0)
MCH: 32.6 pg (ref 26.0–34.0)
MCHC: 36.7 g/dL — ABNORMAL HIGH (ref 30.0–36.0)
MCV: 88.8 fL (ref 78.0–100.0)
Platelets: 67 10*3/uL — ABNORMAL LOW (ref 150–400)
RBC: 4.36 MIL/uL (ref 4.22–5.81)
RDW: 15.7 % — ABNORMAL HIGH (ref 11.5–15.5)
WBC: 2 10*3/uL — ABNORMAL LOW (ref 4.0–10.5)

## 2012-08-30 NOTE — Telephone Encounter (Signed)
Left JV heart cath rescheduled for Wednesday, January 22 at 9:30 - Dr. Sanjuana Kava.  Patient to arrive at 8:30 am.    Patient notified of above.  Will do labs at [facility] today.

## 2012-08-31 ENCOUNTER — Telehealth: Payer: Self-pay | Admitting: *Deleted

## 2012-08-31 DIAGNOSIS — D696 Thrombocytopenia, unspecified: Secondary | ICD-10-CM

## 2012-08-31 LAB — PROTIME-INR
INR: 1.18 (ref <1.50)
Prothrombin Time: 14.9 seconds (ref 11.6–15.2)

## 2012-08-31 LAB — APTT: aPTT: 29 seconds (ref 24–37)

## 2012-08-31 NOTE — Telephone Encounter (Signed)
Message copied by Lesle Chris on Tue Aug 31, 2012  9:11 AM ------      Message from: Lewayne Bunting      Created: Tue Aug 31, 2012  6:50 AM       Plt count has fallen further; cath is elective; cancel cath; schedule fu ov with me in [place]; repeat CBC one week      Olga Millers

## 2012-08-31 NOTE — Telephone Encounter (Signed)
Notes Recorded by Lesle Chris, LPN on 9/60/4540 at 9:11 AM Patient notified. Cath cancelled for tomorrow Aurea Graff notified). He will do repeat CBC on 1/27 at [facility]. Already has OV scheduled for 2/7 with [place] at [facility]. Is this OV soon enough?

## 2012-09-01 ENCOUNTER — Encounter (HOSPITAL_BASED_OUTPATIENT_CLINIC_OR_DEPARTMENT_OTHER): Admission: RE | Payer: Self-pay | Source: Ambulatory Visit

## 2012-09-01 ENCOUNTER — Inpatient Hospital Stay (HOSPITAL_BASED_OUTPATIENT_CLINIC_OR_DEPARTMENT_OTHER)
Admission: RE | Admit: 2012-09-01 | Payer: BC Managed Care – PPO | Source: Ambulatory Visit | Admitting: Cardiovascular Disease

## 2012-09-01 SURGERY — JV LEFT HEART CATHETERIZATION WITH CORONARY ANGIOGRAM
Anesthesia: Moderate Sedation

## 2012-09-02 ENCOUNTER — Telehealth: Payer: Self-pay | Admitting: Hematology & Oncology

## 2012-09-02 ENCOUNTER — Other Ambulatory Visit: Payer: Self-pay | Admitting: Medical

## 2012-09-02 DIAGNOSIS — D649 Anemia, unspecified: Secondary | ICD-10-CM

## 2012-09-02 NOTE — Telephone Encounter (Signed)
Received call from [facility] [phone]) at [facility] in [place] and she faxed the labs to [place]. Pt is aware of 09-03-12 appointment with Dr. Myna Hidalgo. Joy at [facility] will let MD and [facility] know of pt's appointment

## 2012-09-03 ENCOUNTER — Other Ambulatory Visit: Payer: Self-pay | Admitting: Hematology & Oncology

## 2012-09-03 ENCOUNTER — Encounter: Payer: Self-pay | Admitting: Hematology & Oncology

## 2012-09-03 ENCOUNTER — Telehealth: Payer: Self-pay | Admitting: Cardiology

## 2012-09-03 ENCOUNTER — Ambulatory Visit (HOSPITAL_BASED_OUTPATIENT_CLINIC_OR_DEPARTMENT_OTHER): Payer: BC Managed Care – PPO | Admitting: Hematology & Oncology

## 2012-09-03 ENCOUNTER — Other Ambulatory Visit: Payer: BC Managed Care – PPO | Admitting: Lab

## 2012-09-03 ENCOUNTER — Other Ambulatory Visit (HOSPITAL_COMMUNITY)
Admission: RE | Admit: 2012-09-03 | Discharge: 2012-09-03 | Disposition: A | Discharge status: Home / Self Care | Payer: BC Managed Care – PPO | Source: Ambulatory Visit | Attending: Hematology & Oncology | Admitting: Hematology & Oncology

## 2012-09-03 VITALS — BP 149/85 | HR 114 | Temp 97.9°F | Resp 18 | Ht 69.0 in | Wt 210.0 lb

## 2012-09-03 DIAGNOSIS — C9200 Acute myeloblastic leukemia, not having achieved remission: Secondary | ICD-10-CM | POA: Insufficient documentation

## 2012-09-03 DIAGNOSIS — D696 Thrombocytopenia, unspecified: Secondary | ICD-10-CM

## 2012-09-03 LAB — CBC WITH DIFFERENTIAL (CANCER CENTER ONLY)
HCT: 35.3 % — ABNORMAL LOW (ref 38.7–49.9)
HGB: 13.1 g/dL (ref 13.0–17.1)
MCH: 32.9 pg (ref 28.0–33.4)
MCHC: 37.1 g/dL — ABNORMAL HIGH (ref 32.0–35.9)
MCV: 89 fL (ref 82–98)
Platelets: 45 10*3/uL — ABNORMAL LOW (ref 145–400)
RBC: 3.98 10*6/uL — ABNORMAL LOW (ref 4.20–5.70)
RDW: 14.4 % (ref 11.1–15.7)
WBC: 3.1 10*3/uL — ABNORMAL LOW (ref 4.0–10.0)

## 2012-09-03 LAB — MANUAL DIFFERENTIAL (CHCC SATELLITE)
ALC: 1 10*3/uL (ref 0.9–3.3)
ANC (CHCC HP manual diff): 1 10*3/uL — ABNORMAL LOW (ref 1.5–6.5)
Band Neutrophils: 1 % (ref 0–10)
Blasts: 25 % — ABNORMAL HIGH (ref 0–0)
LYMPH: 32 % (ref 14–48)
MONO: 10 % (ref 0–13)
PLT EST ~~LOC~~: DECREASED
Platelet Morphology: NORMAL
SEG: 32 % — ABNORMAL LOW (ref 40–75)
nRBC: 1 % — ABNORMAL HIGH (ref 0–0)

## 2012-09-03 LAB — CHCC SATELLITE - SMEAR

## 2012-09-03 NOTE — Telephone Encounter (Signed)
Noted. FU with me as scheduled Olga Millers

## 2012-09-03 NOTE — Telephone Encounter (Signed)
Patient came by office.  Wanted to inform us of new dx of leukemia.  Had bone marrow biopsy this morning and was told definitely Leukemia, but not sure what type yet.  Will be admitted to [facility] next week & be there x 4-6 weeks.  Informed patient that message would be sent to MD to make him aware.

## 2012-09-03 NOTE — Telephone Encounter (Signed)
Patient called to cancel upcoming appointments due to just being diagnosed with leukemia.

## 2012-09-03 NOTE — Progress Notes (Signed)
This office note has been dictated.

## 2012-09-03 NOTE — Progress Notes (Addendum)
09-03-12  10:20  Bone Marrow Biopsy performed on this patient, after consent form signed by patient and procedure explained by Dr. Myna Hidalgo.  Pre Vitals T 98.2 - BP.149/ 85 - P.114 - R.18  patient was prepped and draped, procedure preformed without incident. Post Vitals T. 98.6 -   BP 135/87 - P.127 - R.18 dry sterile dressing was applied to site. After 20 minutes patient was released  accompied by his wife   and instructed to continue with small amount of pressure on his back. Lupita Raider LPN

## 2012-09-04 NOTE — Procedures (Signed)
I went ahead and did a bone marrow biopsy on Ricardo Zuniga  __________ exam.  We had him sign a consent form.  He then was placed onto his right side.  The left iliac crest was prepped and draped in sterile fashion.  Lidocaine 2% was infiltrated under the skin down to the periosteum.  I used about 8 cc of lidocaine.  A #11 scalpel was used to make an incision into the skin.  His bone marrow was quite "tight."  I did get a couple of aspirates. They were a little dilute.  I then used a Jamshidi biopsy needle.  I used the same incision site as I did with the aspirate.  I got an excellent bone marrow biopsy core.  This was used for flow cytometry and cytogenetics.  Ricardo Zuniga had the procedure site dressed appropriately and sterilely.  Ricardo Zuniga tolerated the procedure well.  There were no complications.    ______________________________ Josph Macho, M.D. PRE/MEDQ  D:  09/03/2012  T:  09/04/2012  Job:  [id number]

## 2012-09-04 NOTE — Progress Notes (Signed)
CC:   Doreen Beam, MD Madolyn Frieze Jens Som, MD, [facility]  DIAGNOSIS:  Acute myeloid leukemia.  CURRENT THERAPY:  Observation.  INTERIM HISTORY:  Mr. Epple comes in for a second office visit.  We first saw him a couple of weeks ago.  I looked at his blood smear.  At that time, his platelet count was 128,000.  Hemoglobin was 14.8 and white cell count 2.3.  Blood smear was pretty much unremarkable.  Unfortunately, things have changed for him.  He had blood work done earlier this week.  It showed that his  platelet count I think was down to 70,000.  There were also noted to be "blasts" on the smear.  He feels well.  He has had no complaints.  There is no bleeding or bruising.  He does have an aphthous ulcer on the right buccal mucosa. He has had this for about a week.  Again, there has been no gingival bleeding.  There has been no gingival swelling.  He has not noticed any palpable lymph glands.  There is no early satiety.  He has had no abdominal pain.  There has been no change in bowel or bladder habits.  He has not noticed any leg swelling.  He does have diabetes.  This is fairly well controlled, however.  He denies any headache.  There is no double vision or blurred vision.  PHYSICAL EXAMINATION:  General:  This is a well-developed, well- nourished white gentleman in no obvious distress.  Vital signs: Temperature of 97.9, pulse 114, respiratory rate 18, blood pressure 149/85.  Weight is 210 pounds.  Head and neck:  Normocephalic, atraumatic skull.  There are no ocular or oral lesions.  Lymph nodes: There are no palpable cervical or supraclavicular lymph nodes.  Lungs: Clear bilaterally.  Cardiac:  Regular rate and rhythm with a normal S1 and S2.  He has a 1/6 systolic ejection murmur.  Abdomen:  Soft with good bowel sounds.  There is no palpable abdominal mass.  There is no fluid wave.  There is no palpable hepatosplenomegaly.  Back:  No tenderness over the spine, ribs, or hips.   Extremities:  No clubbing, cyanosis, or edema.  He has good range of motion of his joints.  He has surgical scars from past joint surgery.  Skin:  Some small ecchymoses on the right upper abdominal wall.  LABORATORY STUDIES:  White cell count is 3.1, hemoglobin 13.1, hematocrit 35.3, platelet count 45,000.  Peripheral smear shows blasts.  He clearly has blasts.  These appear to be myeloblasts.  I do not see any Auer rods.  He has no nucleated red blood cells.  Platelets are __________.  Platelets are __________.  He has no rouleaux formation.  There are no target cells.  IMPRESSION:  Mr. Bebout is a 62 year old gentleman with what clearly appears to be acute myeloid leukemia.  We are going to do a bone marrow biopsy on him in the office.  I will dictate a separate note for this.  Will plan to refer Mr. Bordelon to [facility] once I get the results back from the biopsy.  Again, I suspect that this is going to be acute myeloid leukemia.  I spent an hour or so with Mr. Hardrick and his wife.  I explained to him what the problem was.  I explained to him the important reason for having a bone marrow biopsy done.  I explained to them the use of chromosomes and genetic analysis to identify the type of  leukemia and to help identify how best to treat it, and prognosis.  Mr. Speigner and his wife both took the news as well as could be expected.  They were certainly quite surprised about the likely outcome with the diagnosis.  We probably can take care of everything over the phone for right now.  I will be more than happy to see Mr. Agena back in the office if needed after he goes to [facility] for therapy.  Mr. Klemann is in excellent shape, so I do not see any problems with him not being able to handle induction chemotherapy.    ______________________________ Josph Macho, M.D. PRE/MEDQ  D:  09/03/2012  T:  09/04/2012  Job:  [id number]

## 2012-09-17 ENCOUNTER — Ambulatory Visit: Payer: BC Managed Care – PPO | Admitting: Cardiology

## 2012-09-24 ENCOUNTER — Encounter: Payer: Self-pay | Admitting: Hematology & Oncology

## 2012-09-25 ENCOUNTER — Other Ambulatory Visit: Payer: Self-pay

## 2012-09-30 ENCOUNTER — Encounter: Payer: Self-pay | Admitting: Hematology & Oncology

## 2012-10-01 ENCOUNTER — Other Ambulatory Visit: Payer: BC Managed Care – PPO | Admitting: Lab

## 2012-10-01 ENCOUNTER — Ambulatory Visit: Payer: BC Managed Care – PPO | Admitting: Hematology & Oncology

## 2013-06-16 ENCOUNTER — Other Ambulatory Visit: Payer: Self-pay

## 2013-12-09 ENCOUNTER — Telehealth: Payer: Self-pay | Admitting: *Deleted

## 2013-12-09 ENCOUNTER — Emergency Department (HOSPITAL_COMMUNITY): Payer: BC Managed Care – PPO

## 2013-12-09 ENCOUNTER — Inpatient Hospital Stay (HOSPITAL_COMMUNITY)
Admission: EM | Admit: 2013-12-09 | Discharge: 2013-12-14 | DRG: 287 | Disposition: A | Discharge status: Home / Self Care | Payer: BC Managed Care – PPO | Attending: Cardiovascular Disease | Admitting: Cardiovascular Disease

## 2013-12-09 ENCOUNTER — Encounter: Payer: Self-pay | Admitting: *Deleted

## 2013-12-09 ENCOUNTER — Encounter (HOSPITAL_COMMUNITY): Payer: Self-pay | Admitting: Emergency Medicine

## 2013-12-09 ENCOUNTER — Other Ambulatory Visit: Payer: Self-pay | Admitting: Cardiovascular Disease

## 2013-12-09 DIAGNOSIS — I10 Essential (primary) hypertension: Secondary | ICD-10-CM | POA: Diagnosis present

## 2013-12-09 DIAGNOSIS — I359 Nonrheumatic aortic valve disorder, unspecified: Secondary | ICD-10-CM

## 2013-12-09 DIAGNOSIS — E785 Hyperlipidemia, unspecified: Secondary | ICD-10-CM | POA: Diagnosis present

## 2013-12-09 DIAGNOSIS — I5020 Unspecified systolic (congestive) heart failure: Secondary | ICD-10-CM | POA: Diagnosis present

## 2013-12-09 DIAGNOSIS — Z7982 Long term (current) use of aspirin: Secondary | ICD-10-CM | POA: Diagnosis not present

## 2013-12-09 DIAGNOSIS — Z856 Personal history of leukemia: Secondary | ICD-10-CM

## 2013-12-09 DIAGNOSIS — I5043 Acute on chronic combined systolic (congestive) and diastolic (congestive) heart failure: Secondary | ICD-10-CM | POA: Diagnosis present

## 2013-12-09 DIAGNOSIS — C9241 Acute promyelocytic leukemia, in remission: Secondary | ICD-10-CM | POA: Diagnosis present

## 2013-12-09 DIAGNOSIS — I509 Heart failure, unspecified: Secondary | ICD-10-CM | POA: Diagnosis present

## 2013-12-09 DIAGNOSIS — R079 Chest pain, unspecified: Secondary | ICD-10-CM

## 2013-12-09 DIAGNOSIS — K589 Irritable bowel syndrome without diarrhea: Secondary | ICD-10-CM | POA: Diagnosis present

## 2013-12-09 DIAGNOSIS — I5041 Acute combined systolic (congestive) and diastolic (congestive) heart failure: Secondary | ICD-10-CM | POA: Diagnosis present

## 2013-12-09 DIAGNOSIS — Z87891 Personal history of nicotine dependence: Secondary | ICD-10-CM

## 2013-12-09 DIAGNOSIS — I2589 Other forms of chronic ischemic heart disease: Secondary | ICD-10-CM

## 2013-12-09 DIAGNOSIS — Z79899 Other long term (current) drug therapy: Secondary | ICD-10-CM | POA: Diagnosis not present

## 2013-12-09 DIAGNOSIS — R931 Abnormal findings on diagnostic imaging of heart and coronary circulation: Secondary | ICD-10-CM

## 2013-12-09 DIAGNOSIS — R9389 Abnormal findings on diagnostic imaging of other specified body structures: Secondary | ICD-10-CM

## 2013-12-09 DIAGNOSIS — C9201 Acute myeloblastic leukemia, in remission: Secondary | ICD-10-CM | POA: Diagnosis present

## 2013-12-09 DIAGNOSIS — R5383 Other fatigue: Secondary | ICD-10-CM

## 2013-12-09 DIAGNOSIS — R0989 Other specified symptoms and signs involving the circulatory and respiratory systems: Secondary | ICD-10-CM

## 2013-12-09 DIAGNOSIS — I428 Other cardiomyopathies: Secondary | ICD-10-CM | POA: Diagnosis present

## 2013-12-09 DIAGNOSIS — J811 Chronic pulmonary edema: Secondary | ICD-10-CM

## 2013-12-09 DIAGNOSIS — Z9849 Cataract extraction status, unspecified eye: Secondary | ICD-10-CM | POA: Diagnosis not present

## 2013-12-09 DIAGNOSIS — I420 Dilated cardiomyopathy: Secondary | ICD-10-CM | POA: Diagnosis present

## 2013-12-09 DIAGNOSIS — I5040 Unspecified combined systolic (congestive) and diastolic (congestive) heart failure: Secondary | ICD-10-CM

## 2013-12-09 DIAGNOSIS — I5022 Chronic systolic (congestive) heart failure: Secondary | ICD-10-CM

## 2013-12-09 DIAGNOSIS — R0609 Other forms of dyspnea: Secondary | ICD-10-CM

## 2013-12-09 DIAGNOSIS — IMO0001 Reserved for inherently not codable concepts without codable children: Secondary | ICD-10-CM

## 2013-12-09 DIAGNOSIS — E119 Type 2 diabetes mellitus without complications: Secondary | ICD-10-CM | POA: Diagnosis present

## 2013-12-09 DIAGNOSIS — I255 Ischemic cardiomyopathy: Secondary | ICD-10-CM

## 2013-12-09 DIAGNOSIS — R7989 Other specified abnormal findings of blood chemistry: Secondary | ICD-10-CM

## 2013-12-09 DIAGNOSIS — I351 Nonrheumatic aortic (valve) insufficiency: Secondary | ICD-10-CM

## 2013-12-09 DIAGNOSIS — R5381 Other malaise: Secondary | ICD-10-CM

## 2013-12-09 DIAGNOSIS — I200 Unstable angina: Secondary | ICD-10-CM | POA: Diagnosis present

## 2013-12-09 DIAGNOSIS — R0600 Dyspnea, unspecified: Secondary | ICD-10-CM

## 2013-12-09 DIAGNOSIS — R799 Abnormal finding of blood chemistry, unspecified: Secondary | ICD-10-CM

## 2013-12-09 DIAGNOSIS — Z981 Arthrodesis status: Secondary | ICD-10-CM

## 2013-12-09 DIAGNOSIS — R9439 Abnormal result of other cardiovascular function study: Secondary | ICD-10-CM

## 2013-12-09 DIAGNOSIS — Z0389 Encounter for observation for other suspected diseases and conditions ruled out: Secondary | ICD-10-CM

## 2013-12-09 DIAGNOSIS — R000 Tachycardia, unspecified: Secondary | ICD-10-CM

## 2013-12-09 DIAGNOSIS — K219 Gastro-esophageal reflux disease without esophagitis: Secondary | ICD-10-CM | POA: Diagnosis present

## 2013-12-09 HISTORY — DX: Failed or difficult intubation, initial encounter: T88.4XXA

## 2013-12-09 HISTORY — DX: Cardiac murmur, unspecified: R01.1

## 2013-12-09 HISTORY — DX: Acute promyelocytic leukemia, in remission: C92.41

## 2013-12-09 HISTORY — DX: Type 2 diabetes mellitus without complications: E11.9

## 2013-12-09 LAB — HEPATIC FUNCTION PANEL
ALT: 22 U/L (ref 0–53)
AST: 18 U/L (ref 0–37)
Albumin: 4.2 g/dL (ref 3.5–5.2)
Alkaline Phosphatase: 76 U/L (ref 39–117)
Bilirubin, Direct: <0.2 mg/dL (ref 0.0–0.3)
Total Bilirubin: 0.5 mg/dL (ref 0.3–1.2)
Total Protein: 7 g/dL (ref 6.0–8.3)

## 2013-12-09 LAB — BASIC METABOLIC PANEL
BUN: 21 mg/dL (ref 6–23)
CO2: 21 mEq/L (ref 19–32)
Calcium: 9.7 mg/dL (ref 8.4–10.5)
Chloride: 105 mEq/L (ref 96–112)
Creatinine, Ser: 0.95 mg/dL (ref 0.50–1.35)
GFR calc Af Amer: >90 mL/min (ref >90)
GFR calc non Af Amer: 86 mL/min — ABNORMAL LOW (ref >90)
Glucose, Bld: 145 mg/dL — ABNORMAL HIGH (ref 70–99)
Potassium: 4.3 mEq/L (ref 3.7–5.3)
Sodium: 139 mEq/L (ref 137–147)

## 2013-12-09 LAB — CBG MONITORING, ED: Glucose-Capillary: 115 mg/dL — ABNORMAL HIGH (ref 70–99)

## 2013-12-09 LAB — CBC WITH DIFFERENTIAL/PLATELET
Basophils Absolute: 0 10*3/uL (ref 0.0–0.1)
Basophils Relative: 0 % (ref 0–1)
Eosinophils Absolute: 0.2 10*3/uL (ref 0.0–0.7)
Eosinophils Relative: 3 % (ref 0–5)
HCT: 46.2 % (ref 39.0–52.0)
Hemoglobin: 16.9 g/dL (ref 13.0–17.0)
Lymphocytes Relative: 22 % (ref 12–46)
Lymphs Abs: 1.4 10*3/uL (ref 0.7–4.0)
MCH: 32.4 pg (ref 26.0–34.0)
MCHC: 36.6 g/dL — ABNORMAL HIGH (ref 30.0–36.0)
MCV: 88.7 fL (ref 78.0–100.0)
Monocytes Absolute: 0.7 10*3/uL (ref 0.1–1.0)
Monocytes Relative: 11 % (ref 3–12)
Neutro Abs: 4.1 10*3/uL (ref 1.7–7.7)
Neutrophils Relative %: 64 % (ref 43–77)
Platelets: 209 10*3/uL (ref 150–400)
RBC: 5.21 MIL/uL (ref 4.22–5.81)
RDW: 13.8 % (ref 11.5–15.5)
WBC: 6.4 10*3/uL (ref 4.0–10.5)

## 2013-12-09 LAB — GLUCOSE, CAPILLARY
Glucose-Capillary: 109 mg/dL — ABNORMAL HIGH (ref 70–99)
Glucose-Capillary: 165 mg/dL — ABNORMAL HIGH (ref 70–99)

## 2013-12-09 LAB — TROPONIN I
Troponin I: <0.3 ng/mL (ref <0.30)
Troponin I: <0.3 ng/mL (ref <0.30)
Troponin I: <0.3 ng/mL (ref <0.30)

## 2013-12-09 LAB — PRO B NATRIURETIC PEPTIDE: Pro B Natriuretic peptide (BNP): 1119 pg/mL — ABNORMAL HIGH (ref 0–125)

## 2013-12-09 MED ORDER — PANTOPRAZOLE SODIUM 40 MG PO TBEC
40.0000 mg | DELAYED_RELEASE_TABLET | Freq: Every day | ORAL | Status: DC
  Administered 2013-12-09 – 2013-12-13 (×5): 40 mg via ORAL
  Filled 2013-12-09 (×5): qty 1

## 2013-12-09 MED ORDER — POLYETHYLENE GLYCOL 3350 17 G PO PACK
17.0000 g | PACK | Freq: Every day | ORAL | Status: DC
  Administered 2013-12-09 – 2013-12-13 (×5): 17 g via ORAL
  Filled 2013-12-09 (×6): qty 1

## 2013-12-09 MED ORDER — FUROSEMIDE 20 MG PO TABS
20.0000 mg | ORAL_TABLET | Freq: Every day | ORAL | Status: DC
  Administered 2013-12-09 – 2013-12-11 (×3): 20 mg via ORAL
  Filled 2013-12-09 (×3): qty 1

## 2013-12-09 MED ORDER — METOPROLOL SUCCINATE ER 25 MG PO TB24
25.0000 mg | ORAL_TABLET | Freq: Every day | ORAL | Status: DC
  Administered 2013-12-09 – 2013-12-12 (×4): 25 mg via ORAL
  Filled 2013-12-09 (×5): qty 1

## 2013-12-09 MED ORDER — NITROGLYCERIN 0.4 MG SL SUBL
0.4000 mg | SUBLINGUAL_TABLET | Freq: Once | SUBLINGUAL | Status: AC
  Administered 2013-12-09: 0.4 mg via SUBLINGUAL
  Filled 2013-12-09: qty 1

## 2013-12-09 MED ORDER — MORPHINE SULFATE 2 MG/ML IJ SOLN
2.0000 mg | Freq: Once | INTRAMUSCULAR | Status: AC
  Administered 2013-12-09: 2 mg via INTRAVENOUS
  Filled 2013-12-09: qty 1

## 2013-12-09 MED ORDER — ASPIRIN 81 MG PO CHEW
324.0000 mg | CHEWABLE_TABLET | Freq: Every day | ORAL | Status: DC
  Administered 2013-12-09 – 2013-12-11 (×3): 324 mg via ORAL
  Filled 2013-12-09 (×3): qty 4

## 2013-12-09 MED ORDER — FLUOXETINE HCL 10 MG PO CAPS
10.0000 mg | ORAL_CAPSULE | Freq: Every day | ORAL | Status: DC
  Filled 2013-12-09 (×5): qty 1

## 2013-12-09 MED ORDER — FUROSEMIDE 10 MG/ML IJ SOLN
20.0000 mg | Freq: Once | INTRAMUSCULAR | Status: AC
  Administered 2013-12-09: 20 mg via INTRAVENOUS
  Filled 2013-12-09: qty 2

## 2013-12-09 MED ORDER — ISOSORBIDE DINITRATE 10 MG PO TABS
10.0000 mg | ORAL_TABLET | Freq: Three times a day (TID) | ORAL | Status: DC
  Administered 2013-12-09 – 2013-12-14 (×11): 10 mg via ORAL
  Filled 2013-12-09 (×15): qty 1

## 2013-12-09 MED ORDER — ALPRAZOLAM 0.5 MG PO TABS
0.5000 mg | ORAL_TABLET | Freq: Two times a day (BID) | ORAL | Status: DC
  Administered 2013-12-09 – 2013-12-14 (×10): 0.5 mg via ORAL
  Filled 2013-12-09: qty 2
  Filled 2013-12-09 (×4): qty 1
  Filled 2013-12-09: qty 2
  Filled 2013-12-09 (×4): qty 1

## 2013-12-09 MED ORDER — INSULIN ASPART 100 UNIT/ML ~~LOC~~ SOLN
0.0000 [IU] | Freq: Three times a day (TID) | SUBCUTANEOUS | Status: DC
  Administered 2013-12-10: 3 [IU] via SUBCUTANEOUS
  Administered 2013-12-10 – 2013-12-11 (×4): 1 [IU] via SUBCUTANEOUS
  Administered 2013-12-13: 2 [IU] via SUBCUTANEOUS
  Administered 2013-12-13: 1 [IU] via SUBCUTANEOUS
  Administered 2013-12-13: 2 [IU] via SUBCUTANEOUS
  Administered 2013-12-14: 1 [IU] via SUBCUTANEOUS

## 2013-12-09 NOTE — ED Provider Notes (Signed)
CSN: [id number]     Arrival date & time 12/09/13  0709 History  This chart was scribed for Maudry Diego, MD by Allena Earing, ED Scribe. This patient was seen in room APA10/APA10 and the patient's care was started at 7:34 AM .    Chief Complaint  Patient presents with  . Chest Pain     Patient is a 63 y.o. male presenting with chest pain. The history is provided by the patient and the spouse. No language interpreter was used.  Chest Pain Pain location:  Unable to specify Pain quality: dull   Pain radiates to:  Does not radiate Pain radiates to the back: no   Pain severity:  Moderate Onset quality:  Gradual Duration:  2 weeks Timing:  Intermittent (now constant) Progression:  Worsening Chronicity:  New Relieved by:  Nothing Worsened by:  Exertion Ineffective treatments:  None tried Associated symptoms: no abdominal pain, no back pain, no cough, no fatigue and no headache     HPI Comments: Ricardo Zuniga is a 63 y.o. male with h/o HTN who presents to the Emergency Department complaining of a dull CP that he describes as a "tightness", the pain has been intermittent for several days and he was not initially worried. He woke up this morning and felt like he had a rubber band around his chest. He also reports that he does not have the "stamina" that he used to, he "gives out really easily".Pt was scheduled to have a catheterization, but was then found to have leukemia. Following his treatment for leukemia he has not been able to have one done and has had good EKG. His wife reports that he has been cold and clammy and states that his BP has been constant.   Past Medical History  Diagnosis Date  . Hypertension   . Anal fissure   . Ureteral colic   . Nephrolithiasis   . Gastroesophageal reflux disease   . Peptic ulcer disease   . Diabetes mellitus   . Leukemia    Past Surgical History  Procedure Laterality Date  . Anal fissure repair    . Lumbar fusion    . Cholecystectomy      History reviewed. No pertinent family history. History  Substance Use Topics  . Smoking status: Former Smoker    Quit date: 08/11/1985  . Smokeless tobacco: Never Used  . Alcohol Use: No    Review of Systems  Constitutional: Negative for appetite change and fatigue.  HENT: Negative for congestion, ear discharge and sinus pressure.   Eyes: Negative for discharge.  Respiratory: Negative for cough.   Cardiovascular: Positive for chest pain.       "Murmur present since childhood"  Gastrointestinal: Negative for abdominal pain and diarrhea.  Genitourinary: Negative for frequency and hematuria.  Musculoskeletal: Negative for back pain.  Skin: Negative for rash.  Neurological: Negative for seizures and headaches.  Psychiatric/Behavioral: Negative for hallucinations.      Allergies  Loratadine  Home Medications   Prior to Admission medications   Medication Sig Start Date End Date Taking? Authorizing Provider  docusate sodium (COLACE) 100 MG capsule Take 100 mg by mouth 2 (two) times daily.    Historical Provider, MD  glimepiride (AMARYL) 4 MG tablet Take 4 mg by mouth daily.    Historical Provider, MD  Linagliptin-Metformin HCl (JENTADUETO) 2.5-500 MG TABS Take 2 tabs every morning & 1 tab every evening    Historical Provider, MD  Multiple Vitamins-Minerals (MULTIVITAMIN PO) Take by  mouth every morning.    Historical Provider, MD  valsartan (DIOVAN) 80 MG tablet Take 1 tablet (80 mg total) by mouth daily. 08/25/12   Lelon Perla, MD  Zinc Sulfate (ZINC 15 PO) Take by mouth every morning.    Historical Provider, MD   BP 135/89  Pulse 110  Temp(Src) 98 F (36.7 C) (Oral)  Resp 24  Ht 5\' 9"  (1.753 m)  Wt 215 lb (97.523 kg)  BMI 31.74 kg/m2  SpO2 98% Physical Exam  Nursing note and vitals reviewed. Constitutional: He is oriented to person, place, and time. He appears well-developed.  HENT:  Head: Normocephalic.  Eyes: Conjunctivae and EOM are normal. No scleral  icterus.  Neck: Neck supple. No thyromegaly present.  Cardiovascular: Normal rate and regular rhythm.  Exam reveals no gallop and no friction rub.   No murmur heard. Pulmonary/Chest: No stridor. He has no wheezes. He has no rales. He exhibits no tenderness.  Abdominal: He exhibits no distension. There is no tenderness. There is no rebound.  Musculoskeletal: Normal range of motion. He exhibits no edema.  Lymphadenopathy:    He has no cervical adenopathy.  Neurological: He is oriented to person, place, and time. He exhibits normal muscle tone. Coordination normal.  Skin: No rash noted. No erythema.  Psychiatric: He has a normal mood and affect. His behavior is normal.    ED Course  Procedures (including critical care time)  DIAGNOSTIC STUDIES: Oxygen Saturation is 98% on RA, normal by my interpretation.    COORDINATION OF CARE:   7:40 AM-Discussed treatment plan which includes labs, EKG, CXR with pt at bedside and pt agreed to plan.   Labs Review Labs Reviewed  CBC WITH DIFFERENTIAL  BASIC METABOLIC PANEL  TROPONIN I    Imaging Review No results found.   EKG Interpretation None      MDM   Final diagnoses:  None    The chart was scribed for me under my direct supervision.  I personally performed the history, physical, and medical decision making and all procedures in the evaluation of this patient..   I personally performed the services described in this documentation, which was scribed in my presence. The recorded information has been reviewed and is accurate.    Maudry Diego, MD 12/09/13 [phone]

## 2013-12-09 NOTE — Progress Notes (Signed)
  Echocardiogram 2D Echocardiogram has been performed.  Ricardo Zuniga 12/09/2013, 3:54 PM

## 2013-12-09 NOTE — ED Notes (Signed)
Pt c/o chest pains x 2 weeks intermittent. States was so bad this am and woke him up. Pt c/o sob for a couple weeks also. Pt c/o tightness across upper chest. Denies dizziness/n/v/d. nondiaphoretic at this time. No sob noted but facial grimacing noted. States pain radiates into left arm.

## 2013-12-09 NOTE — Telephone Encounter (Signed)
R and L heart cath scheduled for May 5th 2015 at 9 am with Dr Irish Lack. Pt will have labs drawn at [facility] before procedure. Will let pt know, he is an admit in the hospital on the third floor.

## 2013-12-09 NOTE — ED Notes (Signed)
Pt transferred to floor. Report given to receiving RN.

## 2013-12-09 NOTE — Consult Note (Addendum)
CARDIOLOGY CONSULT NOTE  Patient ID: Ricardo Zuniga MRN: [id number] DOB/AGE: 64-23-1952 64 y.o.  Admit date: 12/09/2013 Primary Physician VYAS,DHRUV B., MD  Reason for Consultation: chest pain, abnormal prior cardiac testing  HPI: The patient is a 63 yr old male with a PMH significant for acute promyelocytic leukemia, irritable bowel syndrome, GERD, and diabetes mellitus, who has been experiencing dyspnea with minimal exertion accompanied by fatigue for the past month. He was most recently evaluated in our practice by Dr. Stanford Breed in 2013. As per phone documentation notes, a cardiac catheterization was being scheduled for September 01, 2012. However, he was subsequently diagnosed with acute myeloid leukemia, and the procedure was deferred. The patient then underwent treatment for leukemia at [facility] commencing 09/07/12 with tretinoin. His symptoms at that time were dyspnea with exertion, but no chest pain was reported.  Cardiac testing in 2013 revealed low normal LV systolic function by echo (EF 50%) with possible wall motion abnormalities noted. This was in 05/2012. In 07/2012, he underwent nuclear stress testing which showed normal myocardial perfusion, calculated LVEF 31%. The catheterization was then scheduled to exclude coronary artery disease as a cause of cardiomyopathy.  For the past month, he has noticed increasing dyspnea and fatigue with exertion with 10-15 minutes of activity. He did not have any chest pain. He denies orthopnea but has had episodes where he wakes up short of breath, but this has gone on for over a year. He denies palpitations, leg swelling, and syncope. He went to Dr. Woody Seller' office earlier this week and was prescribed Prozac and Xanax, as it was felt some of his symptoms may have been related to anxiety and depression.  Early this morning, he was awoken by "severe chest pressure" which he describes as "a resistance band wrapped around my heart". This was unlike  anything he had experienced before. He was given morphine and SL nitro in the ED, with partial but not complete relief of his symptoms. He did have associated shortness of breath, fatigue, and lightheadedness this morning.  ECG today shows normal sinus rhythm with a nonspecific T wave abnormality. First troponin is normal. Chest xray shows "enlargement of cardiac silhouette with pulmonary vascular congestion". BNP 1119. CBC was normal.  His wife said that his HR is routinely in the 110 bpm range and has been like this since treatment for leukemia began. Of note, he has been in remission for some time.  Soc: Married. 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 Quit smoking in 1987. His niece is Lovina Reach, a Arts development officer in [place].    Allergies  Allergen Reactions  . Loratadine-Pseudoephedrine Er Other (See Comments)    "makes patient out of it"    Current Facility-Administered Medications  Medication Dose Route Frequency Provider Last Rate Last Dose  . aspirin chewable tablet 324 mg  324 mg Oral Daily Samuella Cota, MD   324 mg at 12/09/13 1304   Current Outpatient Prescriptions  Medication Sig Dispense Refill  . ALPRAZolam (XANAX) 0.5 MG tablet Take 1 tablet by mouth 2 (two) times daily.      Marland Kitchen FLUoxetine (PROZAC) 10 MG capsule Take 1 capsule by mouth daily.      . fluticasone (FLONASE) 50 MCG/ACT nasal spray Place 2 sprays into both nostrils daily.      Marland Kitchen glimepiride (AMARYL) 4 MG tablet Take 4 mg by mouth daily.      Anastasio Auerbach  300 MG TABS Take 1 tablet by mouth daily.      . Linagliptin-Metformin HCl (JENTADUETO) 2.5-500 MG TABS Take 1-2 tablets by mouth 2 (two) times daily. Take 2 tabs every morning & 1 tab every evening      . Loratadine 10 MG CAPS Take 1 capsule by mouth daily.        Past Medical History  Diagnosis Date  . Hypertension   . Anal fissure   . Ureteral colic   . Nephrolithiasis   . Gastroesophageal  reflux disease   . Peptic ulcer disease     remote  . DM type 2 (diabetes mellitus, type 2)   . APML (acute promyelocytic leukemia) in remission     completed treatment 04/2013  . Heart murmur     Past Surgical History  Procedure Laterality Date  . Anal fissure repair    . Lumbar fusion    . Cholecystectomy    . Cervical fusion    . Knee surgery      x3  . Shoulder surgery      x3  . Cataract extraction, bilateral    . Appendectomy      History   Social History  . Marital Status: Married    Spouse Name: N/A    Number of Children: N/A  . Years of Education: N/A   Occupational History  . [place] [employer]  . SECURITY GUARD    Social History Main Topics  . Smoking status: Former Smoker    Quit date: 08/11/1985  . Smokeless tobacco: Never Used  . Alcohol Use: No  . Drug Use: No  . Sexual Activity: Not on file   Other Topics Concern  . Not on file   Social History Narrative  . No narrative on file     No family history of premature CAD in 1st degree relatives.  Prior to Admission medications   Medication Sig Start Date End Date Taking? Authorizing Provider  ALPRAZolam Duanne Moron) 0.5 MG tablet Take 1 tablet by mouth 2 (two) times daily. 12/07/13  Yes Historical Provider, MD  FLUoxetine (PROZAC) 10 MG capsule Take 1 capsule by mouth daily. 12/07/13  Yes Historical Provider, MD  fluticasone (FLONASE) 50 MCG/ACT nasal spray Place 2 sprays into both nostrils daily.   Yes Historical Provider, MD  glimepiride (AMARYL) 4 MG tablet Take 4 mg by mouth daily.   Yes Historical Provider, MD  INVOKANA 300 MG TABS Take 1 tablet by mouth daily. 12/05/13  Yes Historical Provider, MD  Linagliptin-Metformin HCl (JENTADUETO) 2.5-500 MG TABS Take 1-2 tablets by mouth 2 (two) times daily. Take 2 tabs every morning & 1 tab every evening   Yes Historical Provider, MD  Loratadine 10 MG CAPS Take 1 capsule by mouth daily.   Yes Historical Provider, MD     Review of systems  complete and found to be negative unless listed above in HPI     Physical exam Blood pressure 120/85, pulse 88, temperature 98 F (36.7 C), temperature source Oral, resp. rate 20, height 5\' 9"  (1.753 m), weight 215 lb (97.523 kg), SpO2 94.00%. General: NAD Neck: No JVD, no thyromegaly or thyroid nodule.  Lungs: Clear to auscultation bilaterally with normal respiratory effort. CV: Nondisplaced PMI.  Heart regular S1/S2, no S3/S4, no murmur.  No peripheral edema.  No carotid bruit.  Normal pedal pulses.  Abdomen: Soft, nontender, no hepatosplenomegaly, no distention.  Skin: Intact without lesions or rashes.  Neurologic: Alert  and oriented x 3.  Psych: Normal affect. Extremities: No clubbing or cyanosis.  HEENT: Normal.   Labs:   Lab Results  Component Value Date   WBC 6.4 12/09/2013   HGB 16.9 12/09/2013   HCT 46.2 12/09/2013   MCV 88.7 12/09/2013   PLT 209 12/09/2013    Recent Labs Lab 12/09/13 0728 12/09/13 0741  NA 139  --   K 4.3  --   CL 105  --   CO2 21  --   BUN 21  --   CREATININE 0.95  --   CALCIUM 9.7  --   PROT  --  7.0  BILITOT  --  0.5  ALKPHOS  --  76  ALT  --  22  AST  --  18  GLUCOSE 145*  --    Lab Results  Component Value Date   TROPONINI <0.30 12/09/2013    No results found for this basename: CHOL   No results found for this basename: HDL   No results found for this basename: LDLCALC   No results found for this basename: TRIG   No results found for this basename: CHOLHDL   No results found for this basename: LDLDIRECT         Studies: Dg Chest Portable 1 View  12/09/2013   CLINICAL DATA:  Intermittent chest pressure and pain, former smoker, hypertension, diabetes  EXAM: PORTABLE CHEST - 1 VIEW  COMPARISON:  Portable exam 0741 hr compared to 05/04/2012  FINDINGS: Enlargement of cardiac silhouette with pulmonary vascular congestion.  Mediastinal contours normal.  No definite acute infiltrate, pleural effusion or pneumothorax.  Bones  demineralized.  Prior cervical spine fusion.  IMPRESSION: Enlargement of cardiac silhouette with pulmonary vascular congestion.  No definite acute infiltrate.   Electronically Signed   By: Lavonia Dana M.D.   On: 12/09/2013 07:53    ASSESSMENT AND PLAN: 1. Chest pain: His symptoms are concerning for a cardiomyopathic etiology, given his known prior history of depressed LV systolic function and that his resting HR has routinely been running in the 110 bpm range. This latter detail is concerning in that he may have a tachycardia-mediated component as well. I will order an echocardiogram to evaluate left ventricular systolic function and to assess regional wall motion. His ECG shows a nonspecific T wave abnormality and first troponin is normal. Will rule out for an ACS with serial troponins. If they remain normal, I will arrange for an outpatient right and left heart catheterization with coronary angiography to be performed next week. His BNP is mildly elevated at 1119. I will start short-acting nitrate therapy and ASA can be continued. I will hold off on beta blocker therapy and wait to see what his echo shows. I will given one dose of IV Lasix given the pulmonary vascular congestion seen on chest xray. BP is normal. Resting HR is in the mid to high 90 bpm range at present.  2. Elevated BNP and pulmonary vascular congestion: Plan as detailed in #1. Echo, one dose of IV Lasix, commencement of short-acting nitrate therapy.  3. Diabetes mellitus: Resume outpatient medications.  4. Acute promyelocytic leukemia: In remission. Received treatment with tretinoin at [facility].  5. Tachycardia: As detailed in #1.  Signed: Kate Sable, M.D., F.A.C.C.  12/09/2013, 1:20 PM  ADDENDUM: I personally reviewed the echocardiographic images. Left ventricular systolic function is severely reduced, EF 25-30%, with mild to moderate aortic regurgitation and mild mitral regurgitation. His BP is somewhat soft. Thus, I will  start Toprol-XL 25 mg daily. Cardiac catheterization has been scheduled for early next week. I will also start Lasix 20 mg po daily, and titrate as needed. Will hold off on ACEI until I am assured his BP can tolerate it.

## 2013-12-09 NOTE — Telephone Encounter (Signed)
Message copied by Truett Mainland on Fri Dec 09, 2013  3:49 PM ------      Message from: Kate Sable A      Created: Fri Dec 09, 2013  2:42 PM      Regarding: RE: Cath for pt currently in ED (elective cath early next week)       Please have him contacted, as he's being admitted to third floor.      Can speak with nurse.      Thanks.            Jamesetta So      ----- Message -----         From: Truett Mainland, LPN         Sent: 08/16/1094   2:34 PM           To: Herminio Commons, MD      Subject: RE: Cath for pt currently in ED (elective ca#            I will make an appt for cath. Please let me know how to give this information to this pt since he is in the hospital.                   ----- Message -----         From: Herminio Commons, MD         Sent: 12/09/2013   2:11 PM           To: Bernita Raisin, RN      Subject: Cath for pt currently in ED (elective cath e#            Could you please arrange for right and left heart cath with coronary angiography early next week for this pt?      Please then arrange for f/u with me in the office the following week.      Thanks.            Jamesetta So             ------

## 2013-12-09 NOTE — H&P (Signed)
History and Physical  Ricardo Zuniga JEH:[id number] DOB: 23-Nov-1950 DOA: 12/09/2013  Referring physician: Dr. Roderic Palau in ED PCP: Glenda Chroman., MD   Chief Complaint: chest pressure  HPI:  63 year old man presented to the emergency department with chest pressure this morning, shortness of breath. Initial evaluation was unremarkable, patient was referred for chest pain evaluation.  The patient without history of definite coronary artery disease. Followed by cardiology the past, last seen 07/2012 at which point catheterization was planned to exclude coronary artery disease based on abnormal Myoview ejection fraction. 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 Defer catheterization was canceled. Patient underwent treatment at [facility], completing treatment September 2014. He has had no further diagnostic testing on his heart to assess for ischemic disease. He was last evaluated by his oncologist approximately one week ago at which time he was noted to be in remission.  Patient reports for the last month he has had increasing dyspnea on exertion, he needs to rest after 5 minutes of work, rest often is prolonged up to 30 minutes to recover. He has difficulty walking in [employer] without becoming short of breath. He has had intermittent chest pain not necessarily associated with exertion. However, this a.m., he woke up with severe chest pressure "like a band around my chest, like a [place] kicked me" with some associated shortness of breath and perhaps radiation to his left arm. No diaphoresis. No nausea or vomiting. Symptoms improved with morphine and nitroglycerin. He does not take an aspirin daily.  In the emergency department afebrile, vital signs stable. No hypoxia. Complete metabolic panel unremarkable, troponins negative, BNP mildly elevated 1119. CBC within normal limits. Chest x-ray with pulmonary vascular congestion. EKG normal sinus rhythm,  no acute changes.  Review of Systems:  Negative for fever, visual changes, sore throat, rash, new muscle aches, dysuria, bleeding, n/v/abdominal pain.  Past Medical History  Diagnosis Date  . Hypertension   . Anal fissure   . Ureteral colic   . Nephrolithiasis   . Gastroesophageal reflux disease   . Peptic ulcer disease     remote  . DM type 2 (diabetes mellitus, type 2)   . APML (acute promyelocytic leukemia) in remission     completed treatment 04/2013  . Heart murmur     Past Surgical History  Procedure Laterality Date  . Anal fissure repair    . Lumbar fusion    . Cholecystectomy    . Cervical fusion    . Knee surgery      x3  . Shoulder surgery      x3  . Cataract extraction, bilateral    . Appendectomy      Social History:  reports that he quit smoking about 28 years ago. He has never used smokeless tobacco. He reports that he does not drink alcohol or use illicit drugs.  Allergies  Allergen Reactions  . Loratadine-Pseudoephedrine Er Other (See Comments)    "makes patient out of it"    History reviewed. No pertinent family history.   Prior to Admission medications   Medication Sig Start Date End Date Taking? Authorizing Provider  ALPRAZolam Duanne Moron) 0.5 MG tablet Take 1 tablet by mouth 2 (two) times daily. 12/07/13  Yes Historical Provider, MD  FLUoxetine (PROZAC) 10 MG capsule Take 1 capsule by mouth daily. 12/07/13  Yes Historical Provider, MD  fluticasone (FLONASE) 50 MCG/ACT nasal spray Place 2 sprays into both nostrils daily.   Yes Historical Provider, MD  glimepiride (AMARYL)  4 MG tablet Take 4 mg by mouth daily.   Yes Historical Provider, MD  INVOKANA 300 MG TABS Take 1 tablet by mouth daily. 12/05/13  Yes Historical Provider, MD  Linagliptin-Metformin HCl (JENTADUETO) 2.5-500 MG TABS Take 1-2 tablets by mouth 2 (two) times daily. Take 2 tabs every morning & 1 tab every evening   Yes Historical Provider, MD  Loratadine 10 MG CAPS Take 1 capsule by mouth  daily.   Yes Historical Provider, MD   Physical Exam: Filed Vitals:   12/09/13 1137 12/09/13 1200 12/09/13 1219 12/09/13 1230  BP: 120/83 104/68 104/68 120/85  Pulse: 94 89 94 88  Temp:      TempSrc:      Resp: 17 21 20 20   Height:      Weight:      SpO2: 98% 96% 97% 94%   General: Examined in the emergency department. Appears calm and comfortable Eyes: PERRL, normal lids, irises ENT: grossly normal hearing, lips & tongue Neck: no LAD, masses or thyromegaly Cardiovascular: RRR, no m/r/g. No LE edema. Respiratory: CTA bilaterally, no w/r/r. Normal respiratory effort. Abdomen: soft, ntnd Skin: no rash or induration seen on limited exam Musculoskeletal: grossly normal tone BUE/BLE Psychiatric: grossly normal mood and affect, speech fluent and appropriate Neurologic: grossly non-focal.  Wt Readings from Last 3 Encounters:  12/09/13 97.523 kg (215 lb)  09/03/12 95.255 kg (210 lb)  08/20/12 97.07 kg (214 lb)    Labs on Admission:  Basic Metabolic Panel:  Recent Labs Lab 12/09/13 0728  NA 139  K 4.3  CL 105  CO2 21  GLUCOSE 145*  BUN 21  CREATININE 0.95  CALCIUM 9.7    Liver Function Tests:  Recent Labs Lab 12/09/13 0741  AST 18  ALT 22  ALKPHOS 76  BILITOT 0.5  PROT 7.0  ALBUMIN 4.2   CBC:  Recent Labs Lab 12/09/13 0728  WBC 6.4  NEUTROABS 4.1  HGB 16.9  HCT 46.2  MCV 88.7  PLT 209    Cardiac Enzymes:  Recent Labs Lab 12/09/13 0728 12/09/13 1051  TROPONINI <0.30 <0.30      Recent Labs  12/09/13 0935  PROBNP 1119.0*    CBG:  Recent Labs Lab 12/09/13 1130  GLUCAP 115*     Radiological Exams on Admission: Dg Chest Portable 1 View  12/09/2013   CLINICAL DATA:  Intermittent chest pressure and pain, former smoker, hypertension, diabetes  EXAM: PORTABLE CHEST - 1 VIEW  COMPARISON:  Portable exam 0741 hr compared to 05/04/2012  FINDINGS: Enlargement of cardiac silhouette with pulmonary vascular congestion.  Mediastinal contours  normal.  No definite acute infiltrate, pleural effusion or pneumothorax.  Bones demineralized.  Prior cervical spine fusion.  IMPRESSION: Enlargement of cardiac silhouette with pulmonary vascular congestion.  No definite acute infiltrate.   Electronically Signed   By: Lavonia Dana M.D.   On: 12/09/2013 07:53     Active Problems:   * No active hospital problems. *   Assessment/Plan 1. Chest pain. Dyspnea on exertion for the last month with progressive fatigue, chest pain this morning concerning for anginal equivalent. Currently nontoxic, non-acute EKG, troponin negative. 2. History of APML, in remission.  3. Diabetes mellitus type 2.   Plan observation for cardiac rule out. Given history of abnormal nuclear and plans for cardiac catheterization one year ago, may benefit from further ischemic evaluation.  Discussed with cardiology, plan admission at this point to AP. ASA, NTG PRN, morphine. Cardiology to evaluate and make further recommendations.  Sliding scale insulin.  Discussed with wife and brother-in-law at bedside.  Code Status: full code  DVT prophylaxis: SCDs Family Communication:  Disposition Plan/Anticipated LOS: obs, 24 hours  Time spent: 20 minutes  Murray Hodgkins, MD  [facility] Pager [phone] 12/09/2013, 12:55 PM

## 2013-12-10 DIAGNOSIS — I5041 Acute combined systolic (congestive) and diastolic (congestive) heart failure: Secondary | ICD-10-CM | POA: Diagnosis present

## 2013-12-10 DIAGNOSIS — C9241 Acute promyelocytic leukemia, in remission: Secondary | ICD-10-CM | POA: Diagnosis present

## 2013-12-10 DIAGNOSIS — E119 Type 2 diabetes mellitus without complications: Secondary | ICD-10-CM | POA: Diagnosis present

## 2013-12-10 DIAGNOSIS — I5020 Unspecified systolic (congestive) heart failure: Secondary | ICD-10-CM | POA: Diagnosis present

## 2013-12-10 LAB — BASIC METABOLIC PANEL
BUN: 25 mg/dL — ABNORMAL HIGH (ref 6–23)
CO2: 25 mEq/L (ref 19–32)
Calcium: 9.6 mg/dL (ref 8.4–10.5)
Chloride: 100 mEq/L (ref 96–112)
Creatinine, Ser: 1.05 mg/dL (ref 0.50–1.35)
GFR calc Af Amer: 85 mL/min — ABNORMAL LOW (ref >90)
GFR calc non Af Amer: 73 mL/min — ABNORMAL LOW (ref >90)
Glucose, Bld: 126 mg/dL — ABNORMAL HIGH (ref 70–99)
Potassium: 4.2 mEq/L (ref 3.7–5.3)
Sodium: 139 mEq/L (ref 137–147)

## 2013-12-10 LAB — TROPONIN I: Troponin I: <0.3 ng/mL (ref <0.30)

## 2013-12-10 LAB — GLUCOSE, CAPILLARY
Glucose-Capillary: 138 mg/dL — ABNORMAL HIGH (ref 70–99)
Glucose-Capillary: 204 mg/dL — ABNORMAL HIGH (ref 70–99)
Glucose-Capillary: 114 mg/dL — ABNORMAL HIGH (ref 70–99)

## 2013-12-10 MED ORDER — FLUTICASONE PROPIONATE 50 MCG/ACT NA SUSP
1.0000 | Freq: Every day | NASAL | Status: DC
  Filled 2013-12-10: qty 16

## 2013-12-10 MED ORDER — FLUTICASONE PROPIONATE 50 MCG/ACT NA SUSP
1.0000 | Freq: Every day | NASAL | Status: DC
  Administered 2013-12-10 – 2013-12-14 (×6): 1 via NASAL
  Filled 2013-12-10 (×2): qty 16

## 2013-12-10 MED ORDER — LORATADINE 10 MG PO TABS
10.0000 mg | ORAL_TABLET | Freq: Every day | ORAL | Status: DC
  Administered 2013-12-10 – 2013-12-14 (×6): 10 mg via ORAL
  Filled 2013-12-10 (×6): qty 1

## 2013-12-10 MED ORDER — NITROGLYCERIN 0.4 MG SL SUBL
SUBLINGUAL_TABLET | SUBLINGUAL | Status: AC
  Administered 2013-12-10: 19:00:00
  Filled 2013-12-10: qty 1

## 2013-12-10 MED ORDER — FLUTICASONE PROPIONATE 50 MCG/ACT NA SUSP
NASAL | Status: AC
  Filled 2013-12-10: qty 16

## 2013-12-10 MED ORDER — LORATADINE 10 MG PO TABS: 10.0000 mg | ORAL_TABLET | Freq: Every day | ORAL | Status: DC

## 2013-12-10 MED ORDER — SALINE SPRAY 0.65 % NA SOLN
NASAL | Status: AC
  Filled 2013-12-10: qty 44

## 2013-12-10 MED ORDER — NITROGLYCERIN 0.4 MG SL SUBL
0.4000 mg | SUBLINGUAL_TABLET | SUBLINGUAL | Status: DC | PRN
  Administered 2013-12-11: 0.4 mg via SUBLINGUAL

## 2013-12-10 MED ORDER — SALINE SPRAY 0.65 % NA SOLN
1.0000 | NASAL | Status: DC | PRN
  Administered 2013-12-10: 1 via NASAL
  Filled 2013-12-10: qty 44

## 2013-12-10 MED ORDER — PREDNISOLONE ACETATE 1 % OP SUSP
1.0000 [drp] | Freq: Four times a day (QID) | OPHTHALMIC | Status: DC
  Filled 2013-12-10: qty 1

## 2013-12-10 MED ORDER — NITROGLYCERIN 0.4 MG/SPRAY TL SOLN
1.0000 | Status: DC | PRN
Start: 2013-12-10 — End: 2013-12-10

## 2013-12-10 MED ORDER — NEPAFENAC 0.1 % OP SUSP
1.0000 [drp] | Freq: Three times a day (TID) | OPHTHALMIC | Status: DC
Start: 2013-12-10 — End: 2013-12-11
  Filled 2013-12-10: qty 3

## 2013-12-10 MED ORDER — MAGNESIUM HYDROXIDE 400 MG/5ML PO SUSP
15.0000 mL | Freq: Every day | ORAL | Status: DC | PRN
  Administered 2013-12-10: 15 mL via ORAL
  Filled 2013-12-10: qty 30

## 2013-12-10 MED ORDER — MORPHINE SULFATE 2 MG/ML IJ SOLN: 2.0000 mg | INTRAMUSCULAR | Status: DC | PRN

## 2013-12-10 MED ORDER — LISINOPRIL 2.5 MG PO TABS
2.5000 mg | ORAL_TABLET | Freq: Every day | ORAL | Status: DC
  Administered 2013-12-10 – 2013-12-12 (×3): 2.5 mg via ORAL
  Filled 2013-12-10 (×3): qty 1

## 2013-12-10 MED ORDER — MORPHINE SULFATE 2 MG/ML IJ SOLN: 2.0000 mg | Freq: Once | INTRAMUSCULAR | Status: DC

## 2013-12-10 NOTE — Progress Notes (Addendum)
PROGRESS NOTE  Ricardo Zuniga IHK:[id number] DOB: 1963/10/01 DOA: 12/09/2013 PCP: Glenda Chroman., MD  Addendum 1900. Patient ambulated 3 times today. Most recent time, decided to increase to 3 laps. On last lap developed SOB and slight chest discomfort. Resolved with NTG and morphine. EKG SR, no acute changes. Telemetry ST/SR. Patient asymptomatic now. Discussed with wife at bedside. Continue to monitor. Limit activity. Symptoms similar to last month of DOE.   Summary: 63 year old man presented to the emergency department with chest pressure this morning, shortness of breath. Initial evaluation was unremarkable, patient was referred for chest pain evaluation. Subsequent echocardiogram revealed new diagnosis of significant systolic dysfunction and right/left heart catheterization is planned 5/5  Assessment/Plan: 1. Chest pain with some typical features. Currently pain-free. Troponins negative. No signs or symptoms of ACS at this point.  2. New diagnosis of systolic congestive heart failure, currently compensated. Tolerating Toprol XL. Etiology unclear. Her catheterization planned. 3. Diabetes mellitus type 2. Stable. 4. History of APML in remission. Discussed with his oncologist Dr. Sabas Sous at [facility]. No contraindications to anticoagulation or antiplatelet therapy.   Continue to monitor on telemetry. Continue Lasix, beta blocker, Isordil. Monitor for recurrent chest pain. It has recurrent episodes of chest pain consider transfer earlier.  Left/right heart catheterization planned 5/5.  Depending on clinical course possible discharge with outpatient followup.  Start low-dose ACE inhibitor.  Code Status: full code DVT prophylaxis: Lovenox Family Communication: Discussed above in detail with wife at bedside Disposition Plan: As above  Murray Hodgkins, MD  [facility]  Pager [phone] If 7PM-7AM, please contact night-coverage at [email or web address], password [facility] 12/10/2013, 4:09 PM  LOS:  1 day   Consultants:  Cardiology  Procedures:  2-D echocardiogram. LVEF 25-30%. Grade 2 diastolic dysfunction. Multiple wall motion abnormalities.  Antibiotics:    HPI/Subjective: Had an episode of chest discomfort earlier but overall feeling quite well. No chest pain or shortness of breath now.  Objective: Filed Vitals:   12/10/13 0000 12/10/13 0703 12/10/13 0815 12/10/13 1452  BP: 107/72 125/84 133/89 109/73  Pulse: 91 92 93 88  Temp:  98.6 F (37 C)  97.5 F (36.4 C)  TempSrc:  Oral  Oral  Resp: 18 18  18   Height:      Weight:      SpO2: 96% 97%  97%    Intake/Output Summary (Last 24 hours) at 12/10/13 1609 Last data filed at 12/10/13 1300  Gross per 24 hour  Intake    840 ml  Output      0 ml  Net    840 ml     Filed Weights   12/09/13 0723 12/09/13 1429  Weight: 97.523 kg (215 lb) 95.5 kg (210 lb 8.6 oz)    Exam:   Afebrile, vital signs are stable. No hypoxia.  Gen. Appears calm and comfortable. Speech fluent and clear.  Cardiovascular. Regular rate and rhythm. No murmur, rub gallop. No lower extremity edema.  Respiratory. Clear to auscultation bilaterally. No wheezes, rales or rhonchi. Normal respiratory effort.  Telemetry sinus rhythm.  Data Reviewed:  Blood sugars stable.  Basic metabolic panel unremarkable.  Troponins negative.  Scheduled Meds: . ALPRAZolam  0.5 mg Oral BID  . aspirin  324 mg Oral Daily  . FLUoxetine  10 mg Oral Daily  . fluticasone  1 spray Each Nare Daily  . furosemide  20 mg Oral Daily  . insulin aspart  0-9 Units Subcutaneous TID WC  . isosorbide dinitrate  10 mg Oral TID  .  loratadine  10 mg Oral Daily  . metoprolol succinate  25 mg Oral Daily  . nepafenac  1 drop Left Eye TID  . pantoprazole  40 mg Oral Daily  . polyethylene glycol  17 g Oral QHS  . prednisoLONE acetate  1 drop Left Eye QID   Continuous Infusions:   Principal Problem:   Chest pain Active Problems:   Chronic systolic heart failure    DM type 2 (diabetes mellitus, type 2)   APML (acute promyelocytic leukemia) in remission   Time spent 20 minutes

## 2013-12-10 NOTE — Progress Notes (Signed)
Pt ambulated in hallway with wife three times today. During last walk around 1735, pt complained of lightheadedness, followed by SOB, and then followed by chest tightness/pressure. VS were stable, EKG was obtained, and order for sublingual nitro was given by doctor. With rest and one dose of SL nitro, pt's pain was relieved. MD is aware, will continue to monitor.

## 2013-12-10 NOTE — Progress Notes (Signed)
Pt told nursing that he had some chest pains "a while ago" but they are resolved now and he did not tell staff about it at time of pain. Reeducated pt to let nursing know if the pain comes back at the time that it is back. Will continue to monitor.

## 2013-12-11 DIAGNOSIS — C9201 Acute myeloblastic leukemia, in remission: Secondary | ICD-10-CM

## 2013-12-11 DIAGNOSIS — E119 Type 2 diabetes mellitus without complications: Secondary | ICD-10-CM

## 2013-12-11 DIAGNOSIS — I200 Unstable angina: Secondary | ICD-10-CM | POA: Insufficient documentation

## 2013-12-11 LAB — BASIC METABOLIC PANEL
BUN: 24 mg/dL — ABNORMAL HIGH (ref 6–23)
CO2: 27 mEq/L (ref 19–32)
Calcium: 9.3 mg/dL (ref 8.4–10.5)
Chloride: 98 mEq/L (ref 96–112)
Creatinine, Ser: 0.93 mg/dL (ref 0.50–1.35)
GFR calc Af Amer: >90 mL/min (ref >90)
GFR calc non Af Amer: 87 mL/min — ABNORMAL LOW (ref >90)
Glucose, Bld: 139 mg/dL — ABNORMAL HIGH (ref 70–99)
Potassium: 4.6 mEq/L (ref 3.7–5.3)
Sodium: 137 mEq/L (ref 137–147)

## 2013-12-11 LAB — GLUCOSE, CAPILLARY
Glucose-Capillary: 167 mg/dL — ABNORMAL HIGH (ref 70–99)
Glucose-Capillary: 130 mg/dL — ABNORMAL HIGH (ref 70–99)
Glucose-Capillary: 134 mg/dL — ABNORMAL HIGH (ref 70–99)
Glucose-Capillary: 167 mg/dL — ABNORMAL HIGH (ref 70–99)

## 2013-12-11 LAB — TROPONIN I
Troponin I: <0.3 ng/mL (ref <0.30)
Troponin I: <0.3 ng/mL (ref <0.30)

## 2013-12-11 LAB — MRSA PCR SCREENING: MRSA by PCR: NEGATIVE

## 2013-12-11 LAB — LIPID PANEL
Cholesterol: 223 mg/dL — ABNORMAL HIGH (ref 0–200)
HDL: 36 mg/dL — ABNORMAL LOW (ref >39)
LDL Cholesterol: 163 mg/dL — ABNORMAL HIGH (ref 0–99)
Total CHOL/HDL Ratio: 6.2 RATIO
Triglycerides: 122 mg/dL (ref <150)
VLDL: 24 mg/dL (ref 0–40)

## 2013-12-11 LAB — HEPARIN LEVEL (UNFRACTIONATED): Heparin Unfractionated: 0.25 IU/mL — ABNORMAL LOW (ref 0.30–0.70)

## 2013-12-11 MED ORDER — HEPARIN BOLUS VIA INFUSION
4000.0000 [IU] | Freq: Once | INTRAVENOUS | Status: AC
  Administered 2013-12-11: 4000 [IU] via INTRAVENOUS
  Filled 2013-12-11: qty 4000

## 2013-12-11 MED ORDER — SODIUM CHLORIDE 0.9 % IV SOLN
250.0000 mL | INTRAVENOUS | Status: DC | PRN
  Administered 2013-12-12: 10 mL via INTRAVENOUS

## 2013-12-11 MED ORDER — ASPIRIN 81 MG PO CHEW
81.0000 mg | CHEWABLE_TABLET | ORAL | Status: AC
  Administered 2013-12-12: 81 mg via ORAL
  Filled 2013-12-11: qty 1

## 2013-12-11 MED ORDER — HEPARIN (PORCINE) IN NACL 100-0.45 UNIT/ML-% IJ SOLN
1300.0000 [IU]/h | INTRAMUSCULAR | Status: DC
  Administered 2013-12-12: 1300 [IU]/h via INTRAVENOUS
  Filled 2013-12-11 (×3): qty 250

## 2013-12-11 MED ORDER — SODIUM CHLORIDE 0.9 % IJ SOLN
3.0000 mL | Freq: Two times a day (BID) | INTRAMUSCULAR | Status: DC
  Administered 2013-12-11 – 2013-12-12 (×2): 3 mL via INTRAVENOUS

## 2013-12-11 MED ORDER — ATORVASTATIN CALCIUM 80 MG PO TABS
80.0000 mg | ORAL_TABLET | Freq: Every day | ORAL | Status: DC
  Administered 2013-12-12 – 2013-12-13 (×2): 80 mg via ORAL
  Filled 2013-12-11 (×4): qty 1

## 2013-12-11 MED ORDER — HEPARIN (PORCINE) IN NACL 100-0.45 UNIT/ML-% IJ SOLN
1100.0000 [IU]/h | INTRAMUSCULAR | Status: DC
  Administered 2013-12-11: 1100 [IU]/h via INTRAVENOUS
  Filled 2013-12-11: qty 250

## 2013-12-11 MED ORDER — SODIUM CHLORIDE 0.9 % IJ SOLN: 3.0000 mL | INTRAMUSCULAR | Status: DC | PRN

## 2013-12-11 NOTE — Progress Notes (Signed)
ANTICOAGULATION CONSULT NOTE - Initial Consult  Pharmacy Consult for Heparin Indication: chest pain/ACS  Allergies  Allergen Reactions  . Pseudoephedrine Other (See Comments)    Delirium    Patient Measurements: Height: 5\' 9"  (175.3 cm) Weight: 210 lb 8.6 oz (95.5 kg) IBW/kg (Calculated) : 70.7 Heparin Dosing Weight: 90kg  Vital Signs: Temp: 97.6 F (36.4 C) (05/03 0500) Temp src: Oral (05/03 0500) BP: 95/58 mmHg (05/03 0953) Pulse Rate: 84 (05/03 0953)  Labs:  Recent Labs  12/09/13 0728  12/10/13 0542 12/10/13 1947 12/11/13 0119 12/11/13 0723  HGB 16.9  --   --   --   --   --   HCT 46.2  --   --   --   --   --   PLT 209  --   --   --   --   --   CREATININE 0.95  --  1.05  --   --  0.93  TROPONINI <0.30  < >  --  <0.30 <0.30 <0.30  < > = values in this interval not displayed.  Estimated Creatinine Clearance: 91.5 ml/min (by C-G formula based on Cr of 0.93).   Medical History: Past Medical History  Diagnosis Date  . Hypertension   . Anal fissure   . Ureteral colic   . Nephrolithiasis   . Gastroesophageal reflux disease   . Peptic ulcer disease     remote  . DM type 2 (diabetes mellitus, type 2)   . APML (acute promyelocytic leukemia) in remission     completed treatment 04/2013  . Heart murmur     Medications:  Scheduled:  . ALPRAZolam  0.5 mg Oral BID  . aspirin  324 mg Oral Daily  . FLUoxetine  10 mg Oral Daily  . fluticasone  1 spray Each Nare Daily  . furosemide  20 mg Oral Daily  . insulin aspart  0-9 Units Subcutaneous TID WC  . isosorbide dinitrate  10 mg Oral TID  . lisinopril  2.5 mg Oral Daily  . loratadine  10 mg Oral Daily  . metoprolol succinate  25 mg Oral Daily  .  morphine injection  2 mg Intravenous Once  . nepafenac  1 drop Left Eye TID  . pantoprazole  40 mg Oral Daily  . polyethylene glycol  17 g Oral QHS  . prednisoLONE acetate  1 drop Left Eye QID    Assessment: 63 yo M admitted on 5/1 for evaluation of chest pain/  dyspnea on exertion.  Initial work-up negative.  Patient experienced new onset chest pain today that radiated to shoulders.   MD ordered to start heparin.  Has plans for cardiac cath on 5/5.    Goal of Therapy:  Heparin level 0.3-0.7 units/ml Monitor platelets by anticoagulation protocol: Yes   Plan:  Give 4000 units bolus x 1 Start heparin infusion at 1100 units/hr Check anti-Xa level in 6 hours and daily while on heparin Continue to monitor H&H and platelets  Lavonia Drafts Harvis Mabus 12/11/2013,11:20 AM

## 2013-12-11 NOTE — Progress Notes (Signed)
Pt had sudden onset of sharp chest pain that radiated to both shoulder blades. Pt received one dose of 0.4mg  SL nitro and pain was releived. MD notified. Will continue to monitor.

## 2013-12-11 NOTE — Progress Notes (Signed)
PROGRESS NOTE/transfer summary  Ricardo Zuniga:[id number] DOB: [birth year] DOA: 12/09/2013 PCP: Glenda Chroman., MD Oncologist Dr. Sabas Sous at [place] [phone]  Summary: 63 year old man presented to the emergency department with one-month history of progressive dyspnea on exertion as well as subacute onset of date admission date chest pressure, shortness of breath. Patient was seen by cardiology. Subsequent echocardiogram revealed new diagnosis of significant systolic dysfunction and right/left heart catheterization planned 5/5. However, patient has had several instances of recurrent chest pain, most recently at rest with typical symptoms. Immediately relieved with nitroglycerin. Therefore case was discussed with cardiology Dr. Domenic Polite 5/3 and decision was made to transfer to [facility] today step down unit for consideration of catheterization 5/4.   Assessment/Plan: 1. Unstable angina. Currently pain-free. Troponins negative. Heparin infusion. Continue beta blocker, aspirin, when necessary nitroglycerin. 2. New diagnosis of systolic congestive heart failure, currently compensated. Tolerating Toprol XL. Etiology unclear.  3. Diabetes mellitus type 2. Stable. 4. History of APML in remission. Discussed with his oncologist Dr. Sabas Sous at [facility] [phone] on Friday, May 1. No contraindications to anticoagulation or antiplatelet therapy.   Heparin infusion, continue beta blocker, aspirin. Transfer to [facility] today step down under cardiology service.  At time of conversation: Patient stable without chest pain.  Code Status: full code DVT prophylaxis: Lovenox Family Communication: Discussed above in detail with wife at bedside Disposition Plan: As above  Murray Hodgkins, MD  [facility]  Pager [phone] If 7PM-7AM, please contact night-coverage at [email or web address], password [facility] 12/11/2013, 11:03 AM  LOS: 2 days   Consultants:  Cardiology  Procedures:  2-D  echocardiogram. LVEF 25-30%. Grade 2 diastolic dysfunction. Multiple wall motion abnormalities.  Antibiotics:    HPI/Subjective: Had an episode of chest pain last night associated with ambulation, relieved with nitroglycerin, morphine and rest. EKG at that time was unremarkable. No issues overnight. This morning he was lying in bed when he developed severe sudden chest pain without definite radiation or shortness of breath. Instantly relieved   Objective: Filed Vitals:   12/11/13 0500 12/11/13 0748 12/11/13 0949 12/11/13 0953  BP: 117/77 109/71 112/68 95/58  Pulse: 91 88 88 84  Temp: 97.6 F (36.4 C)     TempSrc: Oral     Resp: 18 18    Height:      Weight:      SpO2: 96% 96%      Intake/Output Summary (Last 24 hours) at 12/11/13 1103 Last data filed at 12/11/13 0900  Gross per 24 hour  Intake    720 ml  Output    875 ml  Net   -155 ml     Filed Weights   12/09/13 0723 12/09/13 1429  Weight: 97.523 kg (215 lb) 95.5 kg (210 lb 8.6 oz)    Exam:   Afebrile, vital signs are stable. No hypoxia.  Gen. Appears comfortable, calm, speech fluent and clear.  Respiratory. No wheezes, rales or rhonchi. Normal respiratory effort.  Cardiovascular. Regular rate and rhythm. No murmur, rub or gallop. No lower extremity edema. Telemetry sinus rhythm, no arrhythmias.  Psychiatric grossly normal mood and affect. Speech fluent and appropriate.  Data Reviewed:  Blood sugars stable.  Basic metabolic panel unremarkable.  Troponins negative.  Scheduled Meds: . ALPRAZolam  0.5 mg Oral BID  . aspirin  324 mg Oral Daily  . FLUoxetine  10 mg Oral Daily  . fluticasone  1 spray Each Nare Daily  . furosemide  20 mg Oral Daily  . insulin aspart  0-9 Units Subcutaneous TID WC  . isosorbide dinitrate  10 mg Oral TID  . lisinopril  2.5 mg Oral Daily  . loratadine  10 mg Oral Daily  . metoprolol succinate  25 mg Oral Daily  .  morphine injection  2 mg Intravenous Once  . nepafenac  1  drop Left Eye TID  . pantoprazole  40 mg Oral Daily  . polyethylene glycol  17 g Oral QHS  . prednisoLONE acetate  1 drop Left Eye QID   Continuous Infusions:   Principal Problem:   Chest pain Active Problems:   Chronic systolic heart failure   DM type 2 (diabetes mellitus, type 2)   APML (acute promyelocytic leukemia) in remission   Systolic CHF   Time spent 35 minutes, greater than 50% counseling and coordination of care

## 2013-12-11 NOTE — Progress Notes (Signed)
ANTICOAGULATION CONSULT NOTE - Follow Up Consult  Pharmacy Consult for heparin Indication: chest pain/ACS  Allergies  Allergen Reactions  . Pseudoephedrine Other (See Comments)    Delirium    Patient Measurements: Height: 5\' 9"  (175.3 cm) Weight: 213 lb 6.5 oz (96.8 kg) IBW/kg (Calculated) : 70.7 Heparin Dosing Weight: 90kg  Vital Signs: Temp: 98.6 F (37 C) (05/03 2006) Temp src: Oral (05/03 2006) BP: 175/103 mmHg (05/03 2006) Pulse Rate: 98 (05/03 2006)  Labs:  Recent Labs  12/09/13 0728  12/10/13 0542 12/10/13 1947 12/11/13 0119 12/11/13 0723 12/11/13 2055  HGB 16.9  --   --   --   --   --   --   HCT 46.2  --   --   --   --   --   --   PLT 209  --   --   --   --   --   --   HEPARINUNFRC  --   --   --   --   --   --  0.25*  CREATININE 0.95  --  1.05  --   --  0.93  --   TROPONINI <0.30  < >  --  <0.30 <0.30 <0.30  --   < > = values in this interval not displayed.  Estimated Creatinine Clearance: 92 ml/min (by C-G formula based on Cr of 0.93).   Medications:  Scheduled:  . ALPRAZolam  0.5 mg Oral BID  . aspirin  324 mg Oral Daily  . [START ON 12/12/2013] aspirin  81 mg Oral Pre-Cath  . atorvastatin  80 mg Oral q1800  . FLUoxetine  10 mg Oral Daily  . fluticasone  1 spray Each Nare Daily  . furosemide  20 mg Oral Daily  . insulin aspart  0-9 Units Subcutaneous TID WC  . isosorbide dinitrate  10 mg Oral TID  . lisinopril  2.5 mg Oral Daily  . loratadine  10 mg Oral Daily  . metoprolol succinate  25 mg Oral Daily  .  morphine injection  2 mg Intravenous Once  . pantoprazole  40 mg Oral Daily  . polyethylene glycol  17 g Oral QHS  . sodium chloride  3 mL Intravenous Q12H   Infusions:  . heparin 1,100 Units/hr (12/11/13 1223)    Assessment: 63 yo male on heparin for unstable angina on heparin at 1100 units/hr and initial heparin level is below goal (HL= 0.25). Noted plans for cath in am.   Goal of Therapy:  Heparin level 0.3-0.7 units/ml Monitor  platelets by anticoagulation protocol: Yes   Plan:   -Increase heparin to 1300 units/hr -Heparin level and CBC in am  Hildred Laser, Pharm D 12/11/2013 9:25 PM

## 2013-12-11 NOTE — Progress Notes (Signed)
Pt to transfer to [facility] today for scheduled cardiac cath tomorrow. Report has been called to receiving RN, carelink is in route, pt's VS are stable, and pt is in NAD. IV is to remain in with heparin drip infusing continuously at 27ml/hr. Will continue to monitor until arrival of carelink.

## 2013-12-11 NOTE — Progress Notes (Signed)
Patient received in transfer from [facility] this afternoon. Full cardiology consultation note by Dr. Bronson Ing from 12/09/2013 serves as the history and physical.  Primary cardiologist: Dr. Kate Sable  Subjective:   Patient currently comfortable without chest pain or breathlessness.   Objective:   Temp:  [97.4 F (36.3 C)-97.6 F (36.4 C)] 97.6 F (36.4 C) (05/03 1545) Pulse Rate:  [84-106] 84 (05/03 0953) Resp:  [18] 18 (05/03 0748) BP: (95-121)/(58-87) 112/58 mmHg (05/03 1545) SpO2:  [95 %-97 %] 96 % (05/03 0748) Last BM Date: 12/08/13  Filed Weights   12/09/13 0723 12/09/13 1429  Weight: 215 lb (97.523 kg) 210 lb 8.6 oz (95.5 kg)    Intake/Output Summary (Last 24 hours) at 12/11/13 1609 Last data filed at 12/11/13 1500  Gross per 24 hour  Intake    371 ml  Output    875 ml  Net   -504 ml    Telemetry: Sinus rhythm.  Exam:  General: No distress.  Lungs: Clear, nonlabored.  Cardiac: RRR, no gallop. Diastolic murmur.  Abdomen: NABS.  Extremities: No pitting.   Lab Results:  Basic Metabolic Panel:  Recent Labs Lab 12/09/13 0728 12/10/13 0542 12/11/13 0723  NA 139 139 137  K 4.3 4.2 4.6  CL 105 100 98  CO2 21 25 27   GLUCOSE 145* 126* 139*  BUN 21 25* 24*  CREATININE 0.95 1.05 0.93  CALCIUM 9.7 9.6 9.3    Liver Function Tests:  Recent Labs Lab 12/09/13 0741  AST 18  ALT 22  ALKPHOS 76  BILITOT 0.5  PROT 7.0  ALBUMIN 4.2    CBC:  Recent Labs Lab 12/09/13 0728  WBC 6.4  HGB 16.9  HCT 46.2  MCV 88.7  PLT 209    Cardiac Enzymes:  Recent Labs Lab 12/10/13 1947 12/11/13 0119 12/11/13 0723  TROPONINI <0.30 <0.30 <0.30    BNP:  Recent Labs  12/09/13 0935  PROBNP 1119.0*    ECG: Sinus tachycardia with left atrial enlargement.  Imaging: PORTABLE CHEST - 1 VIEW  COMPARISON: Portable exam 0741 hr compared to 05/04/2012  FINDINGS: Enlargement of cardiac silhouette with pulmonary  vascular congestion.  Mediastinal contours normal.  No definite acute infiltrate, pleural effusion or pneumothorax.  Bones demineralized.  Prior cervical spine fusion.  IMPRESSION: Enlargement of cardiac silhouette with pulmonary vascular congestion.  No definite acute infiltrate.  Echocardiogram (12/09/2013): Study Conclusions  - Left ventricle: The cavity size was mildly dilated. Wall thickness was increased in a pattern of mild LVH. Systolic function was severely reduced. The estimated ejection fraction was in the range of 25% to 30%. Features are consistent with a pseudonormal left ventricular filling pattern, with concomitant abnormal relaxation and increased filling pressure (grade 2 diastolic dysfunction). Doppler parameters are consistent with both elevated ventricular end-diastolic filling pressure and elevated left atrial filling pressure. - Regional wall motion abnormality: Severe hypokinesis of the mid anterior, mid anteroseptal, and mid anterolateral myocardium; moderate hypokinesis of the mid inferoseptal, mid inferior, and mid inferolateral myocardium. - Aortic valve: Mildly calcified annulus. Trileaflet. Mild to moderate regurgitation. - Mitral valve: Mildly thickened leaflet tips. Mild regurgitation. - Left atrium: The atrium was moderately dilated. - Right ventricle: Systolic function was mildly reduced. - Tricuspid valve: Mild regurgitation. - Pulmonic valve: Mild regurgitation. - Pericardium, extracardiac: A trivial pericardial effusion was identified posterior to the heart.    Medications:   Scheduled Medications: . ALPRAZolam  0.5 mg Oral BID  . aspirin  324 mg Oral Daily  .  FLUoxetine  10 mg Oral Daily  . fluticasone  1 spray Each Nare Daily  . furosemide  20 mg Oral Daily  . insulin aspart  0-9 Units Subcutaneous TID WC  . isosorbide dinitrate  10 mg Oral TID  . lisinopril  2.5 mg Oral Daily  . loratadine  10 mg Oral Daily  . metoprolol  succinate  25 mg Oral Daily  .  morphine injection  2 mg Intravenous Once  . nepafenac  1 drop Left Eye TID  . pantoprazole  40 mg Oral Daily  . polyethylene glycol  17 g Oral QHS  . prednisoLONE acetate  1 drop Left Eye QID     Infusions: . heparin 1,100 Units/hr (12/11/13 1223)     PRN Medications:  magnesium hydroxide, morphine injection, nitroGLYCERIN, sodium chloride   Assessment:   1. Unstable angina. Patient with recurrent chest pain at rest this morning. Cardiac markers have been negative. ECG nonspecific.  2. Cardiomyopathy, LVEF 25-30% with grade 2 diastolic dysfunction, wall motion abnormalities concerning for underlying ischemic heart disease.  3. 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 Oncologist is Dr. Sabas Sous [phone]). Note from.Dr. Sarajane Jews indicates discussion with Dr. Laurance Flatten and no specific contraindications to anticoagulation or antiplatelet treatment at this point.  4. Type 2 diabetes mellitus.  5. Hypertension.  6. Hyperlipidemia, LDL 163. Currently not on statin therapy.  7. Mild to moderate aortic regurgitation.   Plan/Discussion:    Patient currently stable on medical therapy, heparin drip was added this morning. On aspirin, beta blocker, ACE inhibitor, nitrates, low-dose Lasix. Will add Lipitor. Holding on dual antiplatelet therapy until coronary anatomy is defined. Placing on cardiac catheterization schedule for tomorrow.   Satira Sark, M.D., F.A.C.C.

## 2013-12-11 NOTE — Progress Notes (Signed)
Pt. Refuses cpap  ? ?

## 2013-12-12 ENCOUNTER — Encounter (HOSPITAL_COMMUNITY)
Admission: EM | Disposition: A | Discharge status: Home / Self Care | Payer: BC Managed Care – PPO | Source: Home / Self Care | Attending: Cardiovascular Disease

## 2013-12-12 ENCOUNTER — Encounter (HOSPITAL_COMMUNITY): Payer: Self-pay

## 2013-12-12 DIAGNOSIS — I5043 Acute on chronic combined systolic (congestive) and diastolic (congestive) heart failure: Secondary | ICD-10-CM | POA: Diagnosis not present

## 2013-12-12 DIAGNOSIS — R079 Chest pain, unspecified: Secondary | ICD-10-CM | POA: Diagnosis not present

## 2013-12-12 DIAGNOSIS — I2510 Atherosclerotic heart disease of native coronary artery without angina pectoris: Secondary | ICD-10-CM

## 2013-12-12 HISTORY — PX: LEFT AND RIGHT HEART CATHETERIZATION WITH CORONARY ANGIOGRAM: SHX5449

## 2013-12-12 LAB — BASIC METABOLIC PANEL
BUN: 21 mg/dL (ref 6–23)
CO2: 24 mEq/L (ref 19–32)
Calcium: 8.7 mg/dL (ref 8.4–10.5)
Chloride: 104 mEq/L (ref 96–112)
Creatinine, Ser: 0.94 mg/dL (ref 0.50–1.35)
GFR calc Af Amer: >90 mL/min (ref >90)
GFR calc non Af Amer: 86 mL/min — ABNORMAL LOW (ref >90)
Glucose, Bld: 139 mg/dL — ABNORMAL HIGH (ref 70–99)
Potassium: 4.5 mEq/L (ref 3.7–5.3)
Sodium: 140 mEq/L (ref 137–147)

## 2013-12-12 LAB — GLUCOSE, CAPILLARY
Glucose-Capillary: 148 mg/dL — ABNORMAL HIGH (ref 70–99)
Glucose-Capillary: 156 mg/dL — ABNORMAL HIGH (ref 70–99)
Glucose-Capillary: 138 mg/dL — ABNORMAL HIGH (ref 70–99)
Glucose-Capillary: 123 mg/dL — ABNORMAL HIGH (ref 70–99)
Glucose-Capillary: 121 mg/dL — ABNORMAL HIGH (ref 70–99)
Glucose-Capillary: 142 mg/dL — ABNORMAL HIGH (ref 70–99)
Glucose-Capillary: 207 mg/dL — ABNORMAL HIGH (ref 70–99)

## 2013-12-12 LAB — HEPARIN LEVEL (UNFRACTIONATED): Heparin Unfractionated: 0.44 IU/mL (ref 0.30–0.70)

## 2013-12-12 LAB — POCT I-STAT 3, ART BLOOD GAS (G3+)
Acid-base deficit: 4 mmol/L — ABNORMAL HIGH (ref 0.0–2.0)
Bicarbonate: 19.6 mEq/L — ABNORMAL LOW (ref 20.0–24.0)
O2 Saturation: 95 %
TCO2: 21 mmol/L (ref 0–100)
pCO2 arterial: 32.2 mmHg — ABNORMAL LOW (ref 35.0–45.0)
pH, Arterial: 7.394 (ref 7.350–7.450)
pO2, Arterial: 74 mmHg — ABNORMAL LOW (ref 80.0–100.0)

## 2013-12-12 LAB — POCT I-STAT 3, VENOUS BLOOD GAS (G3P V)
Acid-base deficit: 3 mmol/L — ABNORMAL HIGH (ref 0.0–2.0)
Bicarbonate: 23.2 mEq/L (ref 20.0–24.0)
O2 Saturation: 58 %
TCO2: 24 mmol/L (ref 0–100)
pCO2, Ven: 42.6 mmHg — ABNORMAL LOW (ref 45.0–50.0)
pH, Ven: 7.343 — ABNORMAL HIGH (ref 7.250–7.300)
pO2, Ven: 32 mmHg (ref 30.0–45.0)

## 2013-12-12 LAB — CBC
HCT: 45.9 % (ref 39.0–52.0)
Hemoglobin: 16.5 g/dL (ref 13.0–17.0)
MCH: 32.3 pg (ref 26.0–34.0)
MCHC: 35.9 g/dL (ref 30.0–36.0)
MCV: 89.8 fL (ref 78.0–100.0)
Platelets: 218 10*3/uL (ref 150–400)
RBC: 5.11 MIL/uL (ref 4.22–5.81)
RDW: 13.8 % (ref 11.5–15.5)
WBC: 6.6 10*3/uL (ref 4.0–10.5)

## 2013-12-12 LAB — PROTIME-INR
INR: 1.12 (ref 0.00–1.49)
Prothrombin Time: 14.2 seconds (ref 11.6–15.2)

## 2013-12-12 LAB — POCT ACTIVATED CLOTTING TIME: Activated Clotting Time: 149 seconds

## 2013-12-12 SURGERY — LEFT AND RIGHT HEART CATHETERIZATION WITH CORONARY ANGIOGRAM
Anesthesia: LOCAL

## 2013-12-12 MED ORDER — SODIUM CHLORIDE 0.9 % IV SOLN: INTRAVENOUS | Status: DC

## 2013-12-12 MED ORDER — LIDOCAINE HCL (PF) 1 % IJ SOLN
INTRAMUSCULAR | Status: AC
  Filled 2013-12-12: qty 30

## 2013-12-12 MED ORDER — HEPARIN (PORCINE) IN NACL 2-0.9 UNIT/ML-% IJ SOLN
INTRAMUSCULAR | Status: AC
Start: 2013-12-12 — End: 2013-12-12
  Filled 2013-12-12: qty 1000

## 2013-12-12 MED ORDER — VERAPAMIL HCL 2.5 MG/ML IV SOLN
INTRAVENOUS | Status: AC
  Filled 2013-12-12: qty 2

## 2013-12-12 MED ORDER — SODIUM CHLORIDE 0.9 % IV SOLN: 1.0000 mL/kg/h | INTRAVENOUS | Status: AC

## 2013-12-12 MED ORDER — HEPARIN SODIUM (PORCINE) 5000 UNIT/ML IJ SOLN
5000.0000 [IU] | Freq: Three times a day (TID) | INTRAMUSCULAR | Status: DC
  Administered 2013-12-13 – 2013-12-14 (×4): 5000 [IU] via SUBCUTANEOUS
  Filled 2013-12-12 (×7): qty 1

## 2013-12-12 MED ORDER — FENTANYL CITRATE 0.05 MG/ML IJ SOLN
INTRAMUSCULAR | Status: AC
  Filled 2013-12-12: qty 2

## 2013-12-12 MED ORDER — MIDAZOLAM HCL 2 MG/2ML IJ SOLN
INTRAMUSCULAR | Status: AC
  Filled 2013-12-12: qty 2

## 2013-12-12 MED ORDER — NITROGLYCERIN 0.2 MG/ML ON CALL CATH LAB
INTRAVENOUS | Status: AC
  Filled 2013-12-12: qty 1

## 2013-12-12 MED ORDER — HEPARIN SODIUM (PORCINE) 1000 UNIT/ML IJ SOLN
INTRAMUSCULAR | Status: AC
  Filled 2013-12-12: qty 1

## 2013-12-12 MED ORDER — HEPARIN SODIUM (PORCINE) 5000 UNIT/ML IJ SOLN: 5000.0000 [IU] | Freq: Three times a day (TID) | INTRAMUSCULAR | Status: DC

## 2013-12-12 NOTE — Brief Op Note (Signed)
  BRIEF CARDIAC CATH NOTE  12/09/2013 - 12/12/2013  5:17 PM  PATIENT:  Ricardo Zuniga  63 y.o. male with new Dx fo CM, referred for invasive R&LHC.  PRE-OPERATIVE DIAGNOSIS:  Cardiomyopathy  POST-OPERATIVE DIAGNOSIS:  Non-ischemicv CM; LVEDP ~30-33% with AoP of 90-99 mmHg; CO ~3.5;CI 1.6-1.76  PROCEDURE:  Procedure(s): LEFT AND RIGHT HEART CATHETERIZATION WITH CORONARY ANGIOGRAM (N/A)  SURGEON:  Surgeon(s) and Role:    * [name], MD - Primary  PHYSICIAN ASSISTANT:   ANESTHESIA:   local and IV sedation - 25 Fentanyl  EBL:  Total I/O In: 765.8 [P.O.:480; I.V.:285.8] Out: 1500 [Urine:1500]  PROCEUDRE: 39fr R Radial A; 7 Fr R FV; 7 Fr SG Cath for RHC; 5Fr TIG 4.0 for Cor Angio   TOURNIQUET:  TR Band 13; 1630; RFV Sheath - manual pressure  DICTATION: .Note written in EPIC  PLAN OF CARE: Return to CCU for CHF managment  PATIENT DISPOSITION:  PACU - hemodynamically stable.   Delay start of Pharmacological VTE agent (>24hrs) due to surgical blood loss or risk of bleeding: not applicable  [name], M.D., M.S. Interventional Cardiologist  [place] Pager # [phone] 12/12/2013

## 2013-12-12 NOTE — CV Procedure (Signed)
CARDIAC CATHETERIZATION  REPORT  NAME:  Ricardo Zuniga   MRN: [id number] DOB:  1951-04-06   ADMIT DATE: 12/09/2013 Procedure Date: 12/12/2013  INTERVENTIONAL CARDIOLOGIST: [name], M.D., MS PRIMARY CARE PROVIDER: Glenda Chroman., MD PRIMARY CARDIOLOGIST: Kate Sable, MD; Candise Che), MD  PATIENT:  Ricardo Zuniga is a 63 y.o. male the past medical history significant for APML- status post recent chemotherapy, IBS., diabetes mellitus evaluated for exertional dyspnea with minimal exertion and fatigue for over a month. He initially was scheduled for cardiac catheterization in January 2014, this was postponed due to his treatment for APML at [facility].. He had a relatively normal echocardiogram and Myoview in 2013 with EF of 50% and no ischemia. For the last month, he has noticedincreasing dyspnea and fatigue with exertion with 10-15 minutes of activity. He did not have any chest pain. He denies orthopnea but has had episodes where he wakes up short of breath, but this has gone on for over a year. He denies palpitations, leg swelling, and syncope. He says will he had an episode of severe chest pressure on the morning of May 1 -- described as a tight squeezing of a band across his chest. He is not had anything like this before. He had nonspecific ST-T wave changes on ECG. He had an echocardiogram which revealed an EF of 25-30% with severely reduced function a grade 3 diastolic dysfunction. He is now referred for invasive evaluation of the right and left heart catheterization.  PRE-OPERATIVE DIAGNOSIS:    Cardiomyopathy, unclear etiology.  Severe chest pain concerning for acute coronary syndrome.  PROCEDURES PERFORMED:    Right and Left Heart Catheterization with Coronary Angiography  PROCEDURE:Consent:  Risks of procedure as well as the alternatives and risks of each were explained to the (patient/caregiver).  Consent for procedure obtained. Consent for signed by  MD and patient with RN witness -- placed on chart.   PROCEDURE: The patient was brought to the 2nd [place] [facility] Lab in the fasting state and prepped and draped in the usual sterile fashion for Right groin or radial access. A modified Allen's test with plethysmography was performed, revealing excellent Ulnar artery collateral flow.  Sterile technique was used including antiseptics, cap, gloves, gown, hand hygiene, mask and sheet.  Skin prep: Chlorhexidine.  Time Out: Verified patient identification, verified procedure, site/side was marked, verified correct patient position, special equipment/implants available, medications/allergies/relevent history reviewed, required imaging and test results available.  Performed  Access:   Right Radial Artery; 5 Fr Sheath -- Seldinger technique (Angiocath Micropuncture Kit)  IA Radial Cocktail - 23m, IV Heparin 5000 Units.  18 gauge Antecubital IV placed by Cath Lab RN   Right Common Femoral Vein: 5 Fr Sheath exchanged through the existing 18 gauge Antecubital IV using a micropuncture kit & modified Seldinger's technique; unable to advance 5 Fr SGordy CouncilmanCatheter -- approach abandoned  Right Common Femoral Vein: 7 Fr, modified Seldinger technique  Right Heart Catheterization: 5Fr Swan Ganz catheter advanced under fluoroscopy with balloon inflated to the RA, RV, then PCWP-PA for hemodynamic measurement.  Simultaneous FA & PA blood gases checked for SaO2% to calculate FICK CO/CI  Simultaneous PCWP/LV & RV/LV pressures monitored with Angled Pigtail in LV.  Catheter removed completely out of the body with balloon deflated.  Diagnostic Left Heart Catheterization with Coronary Angiography:  5 Fr TIG 4.0 and3 Angled Pigtail) catheters advanced and exchanged over long-exchange Safety J-wire into the ascending aorta and used for  selective coronary artery engagement.  Left & Right Coronary Artery Angiography: TIG 4.0  LV Hemodynamics  (LV Gram): TIG 4.0, Angled Pigtail  TR Band:  1630 Hours, 13 mL air Venous Sheath: removed in Cath lab with manual pressure held for hemostasis.  Hemodynamics: Initial values prior to hydration due hypotension with sedation and radial cocktail  SaO2%  Pressures mmHg  Mean P mmHg  EDP mmHg   Right Atrium    7/6 -- 14/10    5-- 9   Right Ventricle    27/7  --43/10    7--11   Pulmonary Artery   58   25/6   16    PCWP    13/12--21/22   11--17    Central Aortic   95  83/53  -- 101/63   58-78    Left Ventricle    83/13  -- 101/15   27-38      suggestion of mitral gradient of 10-13 mmHg      Cardiac Output:   Cardiac Index:    Thermodilution   3.75    1.76    Fick   3.41    1.6    Left Ventriculography:  EF: Roughly 20 %  Wall Motion: Global hypokinesis  Coronary Anatomy: Right Dominant  Left Main: Large-caliber vessel bifurcates into the LAD and Circumflex; angiographically normal LAD: Large-caliber vessel that gives rise to diagonal branches are moderate in the percent lesion in the distal vessel but otherwise angiography normal. Diagonal branches are free of any significant disease proximal and mid.  Left Circumflex: Large caliber, nondominant vessel which essentially courses as a lateral OM with several small branches. The AV groove branch is relatively small and terminates as a small OM 2.  Angiographically normal.   RCA: Large-caliber, dominant vessel with a roughly the 50-60% irregular lesion.  The remainder the vessel is relatively free of disease. Bifurcates distally into the Right Posterior Descending Artery (RPDA) in the Right Posterior AV Groove Branch (RPAV).  RPDA: Moderate caliber vessel which has about two thirds the way to the apex. No significant irregularities  RPL Sysytem:The RPAV moderate caliber vessel that gives rise to 3 small caliber posterolateral branches.  After reviewing the initial angiography, no obvious culprit lesion was identified.     MEDICATIONS:  Anesthesia:  Local Lidocaine 2 ml for radial; 1 mL for attempted brachial; 10 mL for right femoral vein  Sedation: 25 mcg IV fentanyl ;   Omnipaque Contrast: 75 ml  Anticoagulation:  IV Heparin 5000 Units  Radial Cocktail: 5 mg Verapamil, 400 mcg NTG, 2 ml 2% Lidocaine in 10 ml NS 200 mL normal saline bolus  PATIENT DISPOSITION:    The patient was transferred to the PACU holding area in a hemodynamicaly stable, chest pain free condition.  The patient tolerated the procedure well, and there were no complications.  EBL:   < 10 ml  The patient was stable before, during, and after the procedure.  POST-OPERATIVE DIAGNOSIS:    Severe Nonischemic Cardiomyopathy With No Angiographic CAD, low cardiac output and index by both thermodilution and Fick.  Severely elevated LVEDP despite low right-sided filling pressures and low systemic pressures.  10-13 mmHg Gradient from LVEDP suggesting Mitral Valve Disease  PLAN OF CARE:  The patient was transferred back to the CCU for ongoing care for his cardiomyopathy.  All deferred to the rounding physician to determine appropriate next phase of treatment.  I spent 30 minutes with the patient's family discussing the results and implications.  [name], M.D., M.S. [facility] [address] [place], [place]  [address]  [phone]  12/12/2013 3:05 PM

## 2013-12-12 NOTE — Progress Notes (Signed)
12/12/2013 11:32 AM Felt "loghtheaded"  Bp 92/64 lying. CBG 138. IV fluid increased to 50 mL/hr.  Gave ginger ale to "sip".  No change in EKG or rhythm.  Marlina Cataldi Burnett Eligh Rybacki

## 2013-12-12 NOTE — H&P (View-Only) (Signed)
SUBJECTIVE:  Ricardo Zuniga was seen and examined at bedside.  He has no complaints this morning, feeling much improved since admission.  He continues to have intermittent chest pressure since admission, last episode was yesterday morning.   PHYSICAL EXAM Filed Vitals:   12/12/13 0000 12/12/13 0100 12/12/13 0415 12/12/13 0727  BP:   97/64 109/73  Pulse: 89 92 91 102  Temp:   97.8 F (36.6 C) 98.5 F (36.9 C)  TempSrc:   Oral Oral  Resp:   16   Height:      Weight:      SpO2: 96% 97% 98% 98%   General:  Lying in bed, NAD Lungs:  CTA B/L Heart:  RRR Abdomen:  +bs, non-tender, soft Extremities:  -edema, +2dp b/l  LABS: Lab Results  Component Value Date   TROPONINI <0.30 12/11/2013   Results for orders placed during the hospital encounter of 12/09/13 (from the past 24 hour(s))  GLUCOSE, CAPILLARY     Status: Abnormal   Collection Time    12/11/13 11:52 AM      Result Value Ref Range   Glucose-Capillary 148 (*) 70 - 99 mg/dL   Comment 1 Notify RN    MRSA PCR SCREENING     Status: None   Collection Time    12/11/13  4:06 PM      Result Value Ref Range   MRSA by PCR NEGATIVE  NEGATIVE  GLUCOSE, CAPILLARY     Status: Abnormal   Collection Time    12/11/13  4:51 PM      Result Value Ref Range   Glucose-Capillary 134 (*) 70 - 99 mg/dL  HEPARIN LEVEL (UNFRACTIONATED)     Status: Abnormal   Collection Time    12/11/13  8:55 PM      Result Value Ref Range   Heparin Unfractionated 0.25 (*) 0.30 - 0.70 IU/mL  GLUCOSE, CAPILLARY     Status: Abnormal   Collection Time    12/11/13  9:29 PM      Result Value Ref Range   Glucose-Capillary 167 (*) 70 - 99 mg/dL  BASIC METABOLIC PANEL     Status: Abnormal   Collection Time    12/12/13  4:37 AM      Result Value Ref Range   Sodium 140  137 - 147 mEq/L   Potassium 4.5  3.7 - 5.3 mEq/L   Chloride 104  96 - 112 mEq/L   CO2 24  19 - 32 mEq/L   Glucose, Bld 139 (*) 70 - 99 mg/dL   BUN 21  6 - 23 mg/dL   Creatinine, Ser 0.94  0.50  - 1.35 mg/dL   Calcium 8.7  8.4 - 10.5 mg/dL   GFR calc non Af Amer 86 (*) >90 mL/min   GFR calc Af Amer >90  >90 mL/min  HEPARIN LEVEL (UNFRACTIONATED)     Status: None   Collection Time    12/12/13  4:37 AM      Result Value Ref Range   Heparin Unfractionated 0.44  0.30 - 0.70 IU/mL  CBC     Status: None   Collection Time    12/12/13  4:37 AM      Result Value Ref Range   WBC 6.6  4.0 - 10.5 K/uL   RBC 5.11  4.22 - 5.81 MIL/uL   Hemoglobin 16.5  13.0 - 17.0 g/dL   HCT 45.9  39.0 - 52.0 %   MCV 89.8  78.0 -  100.0 fL   MCH 32.3  26.0 - 34.0 pg   MCHC 35.9  30.0 - 36.0 g/dL   RDW 13.8  11.5 - 15.5 %   Platelets 218  150 - 400 K/uL  PROTIME-INR     Status: None   Collection Time    12/12/13  4:37 AM      Result Value Ref Range   Prothrombin Time 14.2  11.6 - 15.2 seconds   INR 1.12  0.00 - 1.49    Intake/Output Summary (Last 24 hours) at 12/12/13 0160 Last data filed at 12/12/13 0700  Gross per 24 hour  Intake    924 ml  Output   2125 ml  Net  -1201 ml   EKG:  NSR 95bpm  ASSESSMENT AND PLAN:  Principal Problem:   Unstable angina Active Problems:   Chest pain   Chronic systolic heart failure   DM type 2 (diabetes mellitus, type 2)   APML (acute promyelocytic leukemia) in remission   Systolic CHF   Intermediate coronary syndrome   Ricardo Zuniga is a 63 year old former smoker (quit in 1987) with APML (now in remission) and well controlled DM2 [facility] for further evaluation and work up of unstable angina.   Unstable angina in setting of combined HF with echo 5/1: LVEF 25-30% with severely reduced systolic function and grade 2 diastolic dysfunction, wall motion abnormalities concerning for underlying ischemic heart disease. Marland Kitchen No CP this morning. Transferred from [facility] overnight. Cardiac markers have been negative.  Followed with Dr. Stanford Breed in the past. The patient understands that risks included but are not limited to stroke (1 in 1000), death (1 in 75),  kidney failure [usually temporary] (1 in 500), bleeding (1 in 200), allergic reaction [possibly serious] (1 in 200).  The patient understands and agrees to proceed.  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 Oncologist is Dr. Sabas Sous [phone]). Note from.Dr. Sarajane Jews indicates discussion with Dr. Laurance Flatten and no specific contraindications to anticoagulation or antiplatelet treatment at this point. Was on chemo with ATRA/Arsenic protocol and last dose September 2014.   -follow up with Dr. Laurance Flatten June 2015.   DM2--well controlled, reports last A1C <6.  Home medications: Invokana and Jentadueto.   -SSI sensitive for now   Hyperlipidemia--LDL 163 per recent lipid panel. Started on Lipitor this admission.   Case discussed and patient seen with Dr. Percival Spanish.   Jerene Pitch 12/12/2013 8:23 AM  History and all data above reviewed.  Patient examined.  I agree with the findings as above.  Mild sporadic chest pain.  No distress  The patient exam reveals COR:RRR  ,  Lungs: Clear  ,  Abd: Positive bowel sounds, no rebound no guarding, Ext no edema  .  All available labs, radiology testing, previous records reviewed. Agree with documented assessment and plan. Plan cath sometime today.    Jeneen Rinks Ladamien Rammel  9:40 AM  12/12/2013

## 2013-12-12 NOTE — Progress Notes (Signed)
SUBJECTIVE:  Ricardo Zuniga was seen and examined at bedside.  He has no complaints this morning, feeling much improved since admission.  He continues to have intermittent chest pressure since admission, last episode was yesterday morning.   PHYSICAL EXAM Filed Vitals:   12/12/13 0000 12/12/13 0100 12/12/13 0415 12/12/13 0727  BP:   97/64 109/73  Pulse: 89 92 91 102  Temp:   97.8 F (36.6 C) 98.5 F (36.9 C)  TempSrc:   Oral Oral  Resp:   16   Height:      Weight:      SpO2: 96% 97% 98% 98%   General:  Lying in bed, NAD Lungs:  CTA B/L Heart:  RRR Abdomen:  +bs, non-tender, soft Extremities:  -edema, +2dp b/l  LABS: Lab Results  Component Value Date   TROPONINI <0.30 12/11/2013   Results for orders placed during the hospital encounter of 12/09/13 (from the past 24 hour(s))  GLUCOSE, CAPILLARY     Status: Abnormal   Collection Time    12/11/13 11:52 AM      Result Value Ref Range   Glucose-Capillary 148 (*) 70 - 99 mg/dL   Comment 1 Notify RN    MRSA PCR SCREENING     Status: None   Collection Time    12/11/13  4:06 PM      Result Value Ref Range   MRSA by PCR NEGATIVE  NEGATIVE  GLUCOSE, CAPILLARY     Status: Abnormal   Collection Time    12/11/13  4:51 PM      Result Value Ref Range   Glucose-Capillary 134 (*) 70 - 99 mg/dL  HEPARIN LEVEL (UNFRACTIONATED)     Status: Abnormal   Collection Time    12/11/13  8:55 PM      Result Value Ref Range   Heparin Unfractionated 0.25 (*) 0.30 - 0.70 IU/mL  GLUCOSE, CAPILLARY     Status: Abnormal   Collection Time    12/11/13  9:29 PM      Result Value Ref Range   Glucose-Capillary 167 (*) 70 - 99 mg/dL  BASIC METABOLIC PANEL     Status: Abnormal   Collection Time    12/12/13  4:37 AM      Result Value Ref Range   Sodium 140  137 - 147 mEq/L   Potassium 4.5  3.7 - 5.3 mEq/L   Chloride 104  96 - 112 mEq/L   CO2 24  19 - 32 mEq/L   Glucose, Bld 139 (*) 70 - 99 mg/dL   BUN 21  6 - 23 mg/dL   Creatinine, Ser 0.94  0.50  - 1.35 mg/dL   Calcium 8.7  8.4 - 10.5 mg/dL   GFR calc non Af Amer 86 (*) >90 mL/min   GFR calc Af Amer >90  >90 mL/min  HEPARIN LEVEL (UNFRACTIONATED)     Status: None   Collection Time    12/12/13  4:37 AM      Result Value Ref Range   Heparin Unfractionated 0.44  0.30 - 0.70 IU/mL  CBC     Status: None   Collection Time    12/12/13  4:37 AM      Result Value Ref Range   WBC 6.6  4.0 - 10.5 K/uL   RBC 5.11  4.22 - 5.81 MIL/uL   Hemoglobin 16.5  13.0 - 17.0 g/dL   HCT 45.9  39.0 - 52.0 %   MCV 89.8  78.0 -  100.0 fL   MCH 32.3  26.0 - 34.0 pg   MCHC 35.9  30.0 - 36.0 g/dL   RDW 13.8  11.5 - 15.5 %   Platelets 218  150 - 400 K/uL  PROTIME-INR     Status: None   Collection Time    12/12/13  4:37 AM      Result Value Ref Range   Prothrombin Time 14.2  11.6 - 15.2 seconds   INR 1.12  0.00 - 1.49    Intake/Output Summary (Last 24 hours) at 12/12/13 0160 Last data filed at 12/12/13 0700  Gross per 24 hour  Intake    924 ml  Output   2125 ml  Net  -1201 ml   EKG:  NSR 95bpm  ASSESSMENT AND PLAN:  Principal Problem:   Unstable angina Active Problems:   Chest pain   Chronic systolic heart failure   DM type 2 (diabetes mellitus, type 2)   APML (acute promyelocytic leukemia) in remission   Systolic CHF   Intermediate coronary syndrome   Ricardo Zuniga is a 63 year old former smoker (quit in 1987) with APML (now in remission) and well controlled DM2 transferred [facility] for further evaluation and work up of unstable angina.   Unstable angina in setting of combined HF with echo 5/1: LVEF 25-30% with severely reduced systolic function and grade 2 diastolic dysfunction, wall motion abnormalities concerning for underlying ischemic heart disease. Marland Kitchen No CP this morning. Transferred from [facility] overnight. Cardiac markers have been negative.  Followed with Dr. Stanford Breed in the past. The patient understands that risks included but are not limited to stroke (1 in 1000), death (1 in 75),  kidney failure [usually temporary] (1 in 500), bleeding (1 in 200), allergic reaction [possibly serious] (1 in 200).  The patient understands and agrees to proceed.  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 Oncologist is Dr. Sabas Sous [phone]). Note from.Dr. Sarajane Jews indicates discussion with Dr. Laurance Flatten and no specific contraindications to anticoagulation or antiplatelet treatment at this point. Was on chemo with ATRA/Arsenic protocol and last dose September 2014.   -follow up with Dr. Laurance Flatten June 2015.   DM2--well controlled, reports last A1C <6.  Home medications: Invokana and Jentadueto.   -SSI sensitive for now   Hyperlipidemia--LDL 163 per recent lipid panel. Started on Lipitor this admission.   Case discussed and patient seen with Dr. Percival Spanish.   Ricardo Zuniga 12/12/2013 8:23 AM  History and all data above reviewed.  Patient examined.  I agree with the findings as above.  Mild sporadic chest pain.  No distress  The patient exam reveals COR:RRR  ,  Lungs: Clear  ,  Abd: Positive bowel sounds, no rebound no guarding, Ext no edema  .  All available labs, radiology testing, previous records reviewed. Agree with documented assessment and plan. Plan cath sometime today.    Ricardo Zuniga  9:40 AM  12/12/2013

## 2013-12-12 NOTE — Interval H&P Note (Signed)
History and Physical Interval Note:  12/12/2013 2:50 PM  Ricardo Zuniga  has presented today for surgery, with the diagnosis of cp  The various methods of treatment have been discussed with the patient and family. After consideration of risks, benefits and other options for treatment, the patient has consented to  Procedure(s): LEFT AND RIGHT HEART CATHETERIZATION WITH CORONARY ANGIOGRAM (N/A)  +/- PCI as a surgical intervention .   The patient's history has been reviewed, patient examined, no change in status, stable for surgery.  I have reviewed the patient's chart and labs.  Questions were answered to the patient's satisfaction.     Ricardo Zuniga   Cath Lab Visit (complete for each Cath Lab visit)  Clinical Evaluation Leading to the Procedure:   ACS: no  Non-ACS:    Anginal Classification: CCS III  Anti-ischemic medical therapy: Maximal Therapy (2 or more classes of medications)  Non-Invasive Test Results: Intermediate-risk stress test findings: cardiac mortality 1-3%/year - EF 25-30% with Severe hypokinesis of the mid anterior, mid anteroseptal, and mid anterolateral myocardium; moderate hypokinesis of the mid inferoseptal, mid inferior, and mid inferolateral myocardium  Prior CABG: No previous CABG

## 2013-12-13 ENCOUNTER — Encounter (HOSPITAL_COMMUNITY): Admission: RE | Payer: Self-pay | Source: Ambulatory Visit

## 2013-12-13 ENCOUNTER — Ambulatory Visit (HOSPITAL_COMMUNITY)
Admission: RE | Admit: 2013-12-13 | Payer: BC Managed Care – PPO | Source: Ambulatory Visit | Admitting: Interventional Cardiology

## 2013-12-13 DIAGNOSIS — I5022 Chronic systolic (congestive) heart failure: Secondary | ICD-10-CM

## 2013-12-13 DIAGNOSIS — I5040 Unspecified combined systolic (congestive) and diastolic (congestive) heart failure: Secondary | ICD-10-CM

## 2013-12-13 LAB — BASIC METABOLIC PANEL
BUN: 16 mg/dL (ref 6–23)
CO2: 22 mEq/L (ref 19–32)
Calcium: 9.1 mg/dL (ref 8.4–10.5)
Chloride: 102 mEq/L (ref 96–112)
Creatinine, Ser: 0.79 mg/dL (ref 0.50–1.35)
GFR calc Af Amer: >90 mL/min (ref >90)
GFR calc non Af Amer: >90 mL/min (ref >90)
Glucose, Bld: 136 mg/dL — ABNORMAL HIGH (ref 70–99)
Potassium: 4.7 mEq/L (ref 3.7–5.3)
Sodium: 138 mEq/L (ref 137–147)

## 2013-12-13 LAB — CBC
HCT: 46 % (ref 39.0–52.0)
Hemoglobin: 16.4 g/dL (ref 13.0–17.0)
MCH: 32.2 pg (ref 26.0–34.0)
MCHC: 35.7 g/dL (ref 30.0–36.0)
MCV: 90.4 fL (ref 78.0–100.0)
Platelets: 214 10*3/uL (ref 150–400)
RBC: 5.09 MIL/uL (ref 4.22–5.81)
RDW: 13.8 % (ref 11.5–15.5)
WBC: 6 10*3/uL (ref 4.0–10.5)

## 2013-12-13 LAB — GLUCOSE, CAPILLARY
Glucose-Capillary: 147 mg/dL — ABNORMAL HIGH (ref 70–99)
Glucose-Capillary: 178 mg/dL — ABNORMAL HIGH (ref 70–99)
Glucose-Capillary: 151 mg/dL — ABNORMAL HIGH (ref 70–99)
Glucose-Capillary: 186 mg/dL — ABNORMAL HIGH (ref 70–99)

## 2013-12-13 SURGERY — LEFT AND RIGHT HEART CATHETERIZATION WITH CORONARY ANGIOGRAM
Anesthesia: LOCAL

## 2013-12-13 MED ORDER — CARVEDILOL 3.125 MG PO TABS
3.1250 mg | ORAL_TABLET | Freq: Two times a day (BID) | ORAL | Status: DC
  Administered 2013-12-13 – 2013-12-14 (×3): 3.125 mg via ORAL
  Filled 2013-12-13 (×5): qty 1

## 2013-12-13 MED ORDER — FUROSEMIDE 20 MG PO TABS
20.0000 mg | ORAL_TABLET | Freq: Every day | ORAL | Status: DC
  Administered 2013-12-13 – 2013-12-14 (×2): 20 mg via ORAL
  Filled 2013-12-13 (×2): qty 1

## 2013-12-13 MED ORDER — LOSARTAN POTASSIUM 25 MG PO TABS
25.0000 mg | ORAL_TABLET | Freq: Every day | ORAL | Status: DC
  Administered 2013-12-13 – 2013-12-14 (×2): 25 mg via ORAL
  Filled 2013-12-13 (×2): qty 1

## 2013-12-13 MED ORDER — ASPIRIN 81 MG PO CHEW
81.0000 mg | CHEWABLE_TABLET | Freq: Every day | ORAL | Status: DC
  Administered 2013-12-13 – 2013-12-14 (×2): 81 mg via ORAL
  Filled 2013-12-13 (×2): qty 1

## 2013-12-13 MED ORDER — LOSARTAN POTASSIUM 25 MG PO TABS
25.0000 mg | ORAL_TABLET | Freq: Every day | ORAL | Status: DC
  Filled 2013-12-13: qty 1

## 2013-12-13 NOTE — Progress Notes (Signed)
Report received from [facility], unit 2H.  Awaiting patients arrival.  Dirk Dress 12/13/2013 5:58 PM

## 2013-12-13 NOTE — Progress Notes (Signed)
Patient arrived to unit and assisted in to room 3W24.  Patient accompanied by wife.  Patient alert, oriented, verbally responsive, breathing regular and non-labored throughout, no s/s of distress noted throughout, no c/o pain throughout.  Patient oriented to unit and room, needs attended to.  VS WNL.  Will continue to monitor.  Dirk Dress 12/13/2013

## 2013-12-13 NOTE — Progress Notes (Signed)
SUBJECTIVE:  Ricardo Zuniga was seen and examined at bedside.  He feels anxious this morning in regards to his future management for his heart failure.   PHYSICAL EXAM Filed Vitals:   12/13/13 0300 12/13/13 0400 12/13/13 0500 12/13/13 0600  BP: 103/72 106/63 101/70 108/65  Pulse:      Temp:  98.2 F (36.8 C)    TempSrc:  Oral    Resp:      Height:      Weight:      SpO2: 95% 98% 96% 95%   General:  Lying in bed, NAD Lungs:  CTA B/L Heart:  RRR Abdomen:  +bs, non-tender, soft Extremities:  -edema, +2dp b/l  LABS: Lab Results  Component Value Date   TROPONINI <0.30 12/11/2013   Results for orders placed during the hospital encounter of 12/09/13 (from the past 24 hour(s))  GLUCOSE, CAPILLARY     Status: Abnormal   Collection Time    12/12/13  7:31 AM      Result Value Ref Range   Glucose-Capillary 156 (*) 70 - 99 mg/dL  GLUCOSE, CAPILLARY     Status: Abnormal   Collection Time    12/12/13 11:24 AM      Result Value Ref Range   Glucose-Capillary 138 (*) 70 - 99 mg/dL  GLUCOSE, CAPILLARY     Status: Abnormal   Collection Time    12/12/13 12:43 PM      Result Value Ref Range   Glucose-Capillary 123 (*) 70 - 99 mg/dL  POCT I-STAT 3, ART BLOOD GAS (G3+)     Status: Abnormal   Collection Time    12/12/13  4:07 PM      Result Value Ref Range   pH, Arterial 7.394  7.350 - 7.450   pCO2 arterial 32.2 (*) 35.0 - 45.0 mmHg   pO2, Arterial 74.0 (*) 80.0 - 100.0 mmHg   Bicarbonate 19.6 (*) 20.0 - 24.0 mEq/L   TCO2 21  0 - 100 mmol/L   O2 Saturation 95.0     Acid-base deficit 4.0 (*) 0.0 - 2.0 mmol/L   Sample type ARTERIAL    POCT I-STAT 3, VENOUS BLOOD GAS (G3P V)     Status: Abnormal   Collection Time    12/12/13  4:10 PM      Result Value Ref Range   pH, Ven 7.343 (*) 7.250 - 7.300   pCO2, Ven 42.6 (*) 45.0 - 50.0 mmHg   pO2, Ven 32.0  30.0 - 45.0 mmHg   Bicarbonate 23.2  20.0 - 24.0 mEq/L   TCO2 24  0 - 100 mmol/L   O2 Saturation 58.0     Acid-base deficit 3.0 (*)  0.0 - 2.0 mmol/L   Sample type VENOUS     Comment NOTIFIED PHYSICIAN    POCT ACTIVATED CLOTTING TIME     Status: None   Collection Time    12/12/13  4:31 PM      Result Value Ref Range   Activated Clotting Time 149    GLUCOSE, CAPILLARY     Status: Abnormal   Collection Time    12/12/13  4:58 PM      Result Value Ref Range   Glucose-Capillary 121 (*) 70 - 99 mg/dL  GLUCOSE, CAPILLARY     Status: Abnormal   Collection Time    12/12/13  5:45 PM      Result Value Ref Range   Glucose-Capillary 142 (*) 70 - 99 mg/dL  GLUCOSE, CAPILLARY  Status: Abnormal   Collection Time    12/12/13  9:42 PM      Result Value Ref Range   Glucose-Capillary 207 (*) 70 - 99 mg/dL  BASIC METABOLIC PANEL     Status: Abnormal   Collection Time    12/13/13  2:45 AM      Result Value Ref Range   Sodium 138  137 - 147 mEq/L   Potassium 4.7  3.7 - 5.3 mEq/L   Chloride 102  96 - 112 mEq/L   CO2 22  19 - 32 mEq/L   Glucose, Bld 136 (*) 70 - 99 mg/dL   BUN 16  6 - 23 mg/dL   Creatinine, Ser 0.79  0.50 - 1.35 mg/dL   Calcium 9.1  8.4 - 10.5 mg/dL   GFR calc non Af Amer >90  >90 mL/min   GFR calc Af Amer >90  >90 mL/min  CBC     Status: None   Collection Time    12/13/13  2:45 AM      Result Value Ref Range   WBC 6.0  4.0 - 10.5 K/uL   RBC 5.09  4.22 - 5.81 MIL/uL   Hemoglobin 16.4  13.0 - 17.0 g/dL   HCT 46.0  39.0 - 52.0 %   MCV 90.4  78.0 - 100.0 fL   MCH 32.2  26.0 - 34.0 pg   MCHC 35.7  30.0 - 36.0 g/dL   RDW 13.8  11.5 - 15.5 %   Platelets 214  150 - 400 K/uL    Intake/Output Summary (Last 24 hours) at 12/13/13 3329 Last data filed at 12/13/13 0600  Gross per 24 hour  Intake 1608.77 ml  Output   2975 ml  Net -1366.23 ml   EKG:  NSR 95bpm 12/12/13  ASSESSMENT AND PLAN:  Principal Problem:   Unstable angina Active Problems:   Chest pain   Chronic systolic heart failure   DM type 2 (diabetes mellitus, type 2)   APML (acute promyelocytic leukemia) in remission   Systolic CHF    Intermediate coronary syndrome   Ricardo Zuniga is a 63 year old former smoker (quit in 1987) with APML (now in remission) and well controlled DM2 transferred to [facility] from [facility] for further evaluation and work up of unstable angina.   Combined systolic and diastolic heart failure with EF 51-88%, systolic function severely reduced and grade 2 DD with wall motion abnormalities per echo 12/09/13. S/p R and L heart cath with angiogram yesterday with reduced EF.  -currently on ASA, BB, Nitrate, Lipitor, consider change to ARB given ACEi intolerance -carb modified -holding lasix and ACEi (given cough in the past and during admission) -transfer tele vs. home   History of acute promyelocytic leukemia, treated at [facility] 04/2013, and now in remission. Oncologist is Dr. Sabas Sous [phone]). Note from.Dr. Sarajane Jews indicates discussion with Dr. Laurance Flatten and no specific contraindications to anticoagulation or antiplatelet treatment at this point. Was on chemo with ATRA/Arsenic protocol and last dose September 2014.   -follow up with Dr. Laurance Flatten June 2015.   DM2--well controlled, reports last A1C <6.  Home medications: Invokana and Jentadueto.   -SSI sensitive for now   Hyperlipidemia--LDL 163 per recent lipid panel.  -Continue Lipitor  Case discussed and patient seen with Dr. Percival Spanish.   Ricardo Zuniga 12/13/2013 7:28 AM   History and all data above reviewed.  Patient examined.  I agree with the findings as above. He has a dilated cardiomyopathy. Still with some  cough but no acute SOB.   The patient exam reveals COR:RRR  ,  Lungs: bilateral crackles.,  Abd: Positive bowel sounds, no rebound no guarding, Ext Right wrist OK.  No edema  .  All available labs, radiology testing, previous records reviewed. Agree with documented assessment and plan.  He has not been on cardiotoxic drugs (ie no anthracyclines) to explain this continued gradual decline in EF.  We reviewed the history and meds carefully.  We will send off  labs to consider amyloid.  In the meantime we will need med titration.  His BP has been somewhat low.  I will change to Coreg and ARB (cough question ACE).  Long discussion with the wife and patient.   Ricardo Zuniga  10:15 AM  12/13/2013

## 2013-12-14 DIAGNOSIS — E785 Hyperlipidemia, unspecified: Secondary | ICD-10-CM | POA: Diagnosis present

## 2013-12-14 DIAGNOSIS — IMO0001 Reserved for inherently not codable concepts without codable children: Secondary | ICD-10-CM

## 2013-12-14 DIAGNOSIS — Z0389 Encounter for observation for other suspected diseases and conditions ruled out: Secondary | ICD-10-CM

## 2013-12-14 LAB — BASIC METABOLIC PANEL
BUN: 16 mg/dL (ref 6–23)
CO2: 24 mEq/L (ref 19–32)
Calcium: 9.4 mg/dL (ref 8.4–10.5)
Chloride: 103 mEq/L (ref 96–112)
Creatinine, Ser: 0.85 mg/dL (ref 0.50–1.35)
GFR calc Af Amer: >90 mL/min (ref >90)
GFR calc non Af Amer: >90 mL/min (ref >90)
Glucose, Bld: 142 mg/dL — ABNORMAL HIGH (ref 70–99)
Potassium: 4.3 mEq/L (ref 3.7–5.3)
Sodium: 139 mEq/L (ref 137–147)

## 2013-12-14 LAB — GLUCOSE, CAPILLARY: Glucose-Capillary: 137 mg/dL — ABNORMAL HIGH (ref 70–99)

## 2013-12-14 MED ORDER — CARVEDILOL 3.125 MG PO TABS: 3.1250 mg | ORAL_TABLET | Freq: Two times a day (BID) | ORAL | Status: DC

## 2013-12-14 MED ORDER — LOSARTAN POTASSIUM 25 MG PO TABS: 25.0000 mg | ORAL_TABLET | Freq: Every day | ORAL | Status: DC

## 2013-12-14 MED ORDER — FUROSEMIDE 20 MG PO TABS: 20.0000 mg | ORAL_TABLET | Freq: Every day | ORAL | Status: DC

## 2013-12-14 MED ORDER — LINAGLIPTIN-METFORMIN HCL 2.5-500 MG PO TABS: 1.0000 | ORAL_TABLET | Freq: Two times a day (BID) | ORAL | Status: DC

## 2013-12-14 MED ORDER — PANTOPRAZOLE SODIUM 40 MG PO TBEC: 40.0000 mg | DELAYED_RELEASE_TABLET | Freq: Every day | ORAL | Status: DC

## 2013-12-14 MED ORDER — ISOSORBIDE MONONITRATE ER 30 MG PO TB24: 30.0000 mg | ORAL_TABLET | Freq: Every day | ORAL | Status: DC

## 2013-12-14 MED ORDER — ATORVASTATIN CALCIUM 80 MG PO TABS: 80.0000 mg | ORAL_TABLET | Freq: Every day | ORAL | Status: DC

## 2013-12-14 MED ORDER — ASPIRIN 81 MG PO CHEW: 81.0000 mg | CHEWABLE_TABLET | Freq: Every day | ORAL | Status: DC

## 2013-12-14 NOTE — Progress Notes (Signed)
SUBJECTIVE:   [name] is a 63 year old gentleman with a history of acute systolic congestive heart failure. [facility] for further evaluation. His echocardiogram reveals markedly reduced left ventricular systolic function with an ejection fraction of 25-30%. He has diastolic dysfunction. There is mild to moderate aortic insufficiency, mild mitral regurgitation, mild tricuspid regurgitation, mild pulmonic insufficiency.  Right and left heart catheterization reveals normal right-sided pressures.   He has relatively normal coronary arteries.   [name] was seen and examined at bedside.  He feels anxious this morning in regards to his future management for his heart failure.   PHYSICAL EXAM Filed Vitals:   12/13/13 1638 12/13/13 1827 12/13/13 2000 12/14/13 0557  BP: 115/75 106/64 106/65 107/66  Pulse:  93 90 90  Temp:  98.4 F (36.9 C) 98 F (36.7 C) 98.2 F (36.8 C)  TempSrc:  Oral Oral Oral  Resp:  17 18 18   Height:  5\' 9"  (1.753 m)    Weight:  212 lb (96.163 kg)  212 lb 1.3 oz (96.2 kg)  SpO2:  98% 99% 100%   General:  Lying in bed, NAD Lungs:  CTA B/L Heart:  RRR Abdomen:  +bs, non-tender, soft Extremities:  -edema, +2dp b/l  LABS: Lab Results  Component Value Date   TROPONINI <0.30 12/11/2013   Results for orders placed during the hospital encounter of 12/09/13 (from the past 24 hour(s))  GLUCOSE, CAPILLARY     Status: Abnormal   Collection Time    12/13/13 12:26 PM      Result Value Ref Range   Glucose-Capillary 178 (*) 70 - 99 mg/dL  GLUCOSE, CAPILLARY     Status: Abnormal   Collection Time    12/13/13  4:41 PM      Result Value Ref Range   Glucose-Capillary 151 (*) 70 - 99 mg/dL  GLUCOSE, CAPILLARY     Status: Abnormal   Collection Time    12/13/13  9:05 PM      Result Value Ref Range   Glucose-Capillary 186 (*) 70 - 99 mg/dL  BASIC METABOLIC PANEL     Status: Abnormal   Collection Time    12/14/13  5:30  AM      Result Value Ref Range   Sodium 139  137 - 147 mEq/L   Potassium 4.3  3.7 - 5.3 mEq/L   Chloride 103  96 - 112 mEq/L   CO2 24  19 - 32 mEq/L   Glucose, Bld 142 (*) 70 - 99 mg/dL   BUN 16  6 - 23 mg/dL   Creatinine, Ser 0.85  0.50 - 1.35 mg/dL   Calcium 9.4  8.4 - 10.5 mg/dL   GFR calc non Af Amer >90  >90 mL/min   GFR calc Af Amer >90  >90 mL/min  GLUCOSE, CAPILLARY     Status: Abnormal   Collection Time    12/14/13  7:24 AM      Result Value Ref Range   Glucose-Capillary 137 (*) 70 - 99 mg/dL    Intake/Output Summary (Last 24 hours) at 12/14/13 0745 Last data filed at 12/13/13 1400  Gross per 24 hour  Intake    600 ml  Output    400 ml  Net    200 ml   EKG:  NSR 95bpm 12/12/13  ASSESSMENT AND PLAN:  Principal Problem:   Combined congestive systolic and diastolic heart failure Active Problems:   DM type 2 (  diabetes mellitus, type 2)   APML (acute promyelocytic leukemia) in remission   Unstable angina   Intermediate coronary syndrome   [name] is a 63 year old former smoker (quit in 1987) with APML (now in remission) and well controlled DM2 transferred to [facility] from [facility] for further evaluation and work up of unstable angina.   Combined systolic and diastolic heart failure with EF 29-92%, systolic function severely reduced and grade 2 DD with wall motion abnormalities per echo 12/09/13. S/p R and L heart cath with angiogram  with reduced EF.   He's currently on aspirin 81 mg Carvedilol 3.125 mg twice a day Lasix 20 mg a day Isordil 10 mg 3 times a day Losartan 25 mg a day    History of acute promyelocytic leukemia, treated at [facility] 04/2013, and now in remission. Oncologist is Dr. Sabas Sous [phone]). Note from.Dr. Sarajane Jews indicates discussion with Dr. Laurance Flatten and no specific contraindications to anticoagulation or antiplatelet treatment at this point. Was on chemo with ATRA/Arsenic protocol and last dose September 2014.   -follow up with Dr. Laurance Flatten June 2015.    DM2--well controlled, reports last A1C <6.  Home medications: Invokana and Jentadueto.   -SSI sensitive for now   Hyperlipidemia--LDL 163 per recent lipid panel.  -Continue Lipitor  He will follow up with Dr. Bronson Ing in several weeks.    Thayer Headings, Brooke Bonito., MD, [facility] 12/14/2013, 7:59 AM Office - [phone] Pager [phone]

## 2013-12-14 NOTE — Discharge Instructions (Signed)
Heart Failure °Heart failure is a condition in which the heart has trouble pumping blood. This means your heart does not pump blood efficiently for your body to work well. In some cases of heart failure, fluid may back up into your lungs or you may have swelling (edema) in your lower legs. Heart failure is usually a long-term (chronic) condition. It is important for you to take good care of yourself and follow your caregiver's treatment plan. °CAUSES  °Some health conditions can cause heart failure. Those health conditions include: °· High blood pressure (hypertension) causes the heart muscle to work harder than normal. When pressure in the blood vessels is high, the heart needs to pump (contract) with more force in order to circulate blood throughout the body. High blood pressure eventually causes the heart to become stiff and weak. °· Coronary artery disease (CAD) is the buildup of cholesterol and fat (plaque) in the arteries of the heart. The blockage in the arteries deprives the heart muscle of oxygen and blood. This can cause chest pain and may lead to a heart attack. High blood pressure can also contribute to CAD. °· Heart attack (myocardial infarction) occurs when 1 or more arteries in the heart become blocked. The loss of oxygen damages the muscle tissue of the heart. When this happens, part of the heart muscle dies. The injured tissue does not contract as well and weakens the heart's ability to pump blood. °· Abnormal heart valves can cause heart failure when the heart valves do not open and close properly. This makes the heart muscle pump harder to keep the blood flowing. °· Heart muscle disease (cardiomyopathy or myocarditis) is damage to the heart muscle from a variety of causes. These can include drug or alcohol abuse, infections, or unknown reasons. These can increase the risk of heart failure. °· Lung disease makes the heart work harder because the lungs do not work properly. This can cause a strain  on the heart, leading it to fail. °· Diabetes increases the risk of heart failure. High blood sugar contributes to high fat (lipid) levels in the blood. Diabetes can also cause slow damage to tiny blood vessels that carry important nutrients to the heart muscle. When the heart does not get enough oxygen and food, it can cause the heart to become weak and stiff. This leads to a heart that does not contract efficiently. °· Other conditions can contribute to heart failure. These include abnormal heart rhythms, thyroid problems, and low blood counts (anemia). °Certain unhealthy behaviors can increase the risk of heart failure. Those unhealthy behaviors include: °· Being overweight. °· Smoking or chewing tobacco. °· Eating foods high in fat and cholesterol. °· Abusing illicit drugs or alcohol. °· Lacking physical activity. °SYMPTOMS  °Heart failure symptoms may vary and can be hard to detect. Symptoms may include: °· Shortness of breath with activity, such as climbing stairs. °· Persistent cough. °· Swelling of the feet, ankles, legs, or abdomen. °· Unexplained weight gain. °· Difficulty breathing when lying flat (orthopnea). °· Waking from sleep because of the need to sit up and get more air. °· Rapid heartbeat. °· Fatigue and loss of energy. °· Feeling lightheaded, dizzy, or close to fainting. °· Loss of appetite. °· Nausea. °· Increased urination during the night (nocturia). °DIAGNOSIS  °A diagnosis of heart failure is based on your history, symptoms, physical examination, and diagnostic tests. °Diagnostic tests for heart failure may include: °· Echocardiography. °· Electrocardiography. °· Chest X-ray. °· Blood tests. °· Exercise   stress test. °· Cardiac angiography. °· Radionuclide scans. °TREATMENT  °Treatment is aimed at managing the symptoms of heart failure. Medicines, behavioral changes, or surgical intervention may be necessary to treat heart failure. °· Medicines to help treat heart failure may  include: °· Angiotensin-converting enzyme (ACE) inhibitors. This type of medicine blocks the effects of a blood protein called angiotensin-converting enzyme. ACE inhibitors relax (dilate) the blood vessels and help lower blood pressure. °· Angiotensin receptor blockers. This type of medicine blocks the actions of a blood protein called angiotensin. Angiotensin receptor blockers dilate the blood vessels and help lower blood pressure. °· Water pills (diuretics). Diuretics cause the kidneys to remove salt and water from the blood. The extra fluid is removed through urination. This loss of extra fluid lowers the volume of blood the heart pumps. °· Beta blockers. These prevent the heart from beating too fast and improve heart muscle strength. °· Digitalis. This increases the force of the heartbeat. °· Healthy behavior changes include: °· Obtaining and maintaining a healthy weight. °· Stopping smoking or chewing tobacco. °· Eating heart healthy foods. °· Limiting or avoiding alcohol. °· Stopping illicit drug use. °· Physical activity as directed by your caregiver. °· Surgical treatment for heart failure may include: °· A procedure to open blocked arteries, repair damaged heart valves, or remove damaged heart muscle tissue. °· A pacemaker to improve heart muscle function and control certain abnormal heart rhythms. °· An internal cardioverter defibrillator to treat certain serious abnormal heart rhythms. °· A left ventricular assist device to assist the pumping ability of the heart. °HOME CARE INSTRUCTIONS  °· Take your medicine as directed by your caregiver. Medicines are important in reducing the workload of your heart, slowing the progression of heart failure, and improving your symptoms. °· Do not stop taking your medicine unless directed by your caregiver. °· Do not skip any dose of medicine. °· Refill your prescriptions before you run out of medicine. Your medicines are needed every day. °· Take over-the-counter  medicine only as directed by your caregiver or pharmacist. °· Engage in moderate physical activity if directed by your caregiver. Moderate physical activity can benefit some people. The elderly and people with severe heart failure should consult with a caregiver for physical activity recommendations. °· Eat heart healthy foods. Food choices should be free of trans fat and low in saturated fat, cholesterol, and salt (sodium). Healthy choices include fresh or frozen fruits and vegetables, fish, lean meats, legumes, fat-free or low-fat dairy products, and whole grain or high fiber foods. Talk to a dietitian to learn more about heart healthy foods. °· Limit sodium if directed by your caregiver. Sodium restriction may reduce symptoms of heart failure in some people. Talk to a dietitian to learn more about heart healthy seasonings. °· Use healthy cooking methods. Healthy cooking methods include roasting, grilling, broiling, baking, poaching, steaming, or stir-frying. Talk to a dietitian to learn more about healthy cooking methods. °· Limit fluids if directed by your caregiver. Fluid restriction may reduce symptoms of heart failure in some people. °· Weigh yourself every day. Daily weights are important in the early recognition of excess fluid. You should weigh yourself every morning after you urinate and before you eat breakfast. Wear the same amount of clothing each time you weigh yourself. Record your daily weight. Provide your caregiver with your weight record. °· Monitor and record your blood pressure if directed by your caregiver. °· Check your pulse if directed by your caregiver. °· Lose weight if directed   by your caregiver. Weight loss may reduce symptoms of heart failure in some people.  Stop smoking or chewing tobacco. Nicotine makes your heart work harder by causing your blood vessels to constrict. Do not use nicotine gum or patches before talking to your caregiver.  Schedule and attend follow-up visits as  directed by your caregiver. It is important to keep all your appointments.  Limit alcohol intake to no more than 1 drink per day for nonpregnant women and 2 drinks per day for men. Drinking more than that is harmful to your heart. Tell your caregiver if you drink alcohol several times a week. Talk with your caregiver about whether alcohol is safe for you. If your heart has already been damaged by alcohol or you have severe heart failure, drinking alcohol should be stopped completely.  Stop illicit drug use.  Stay up-to-date with immunizations. It is especially important to prevent respiratory infections through current pneumococcal and influenza immunizations.  Manage other health conditions such as hypertension, diabetes, thyroid disease, or abnormal heart rhythms as directed by your caregiver.  Learn to manage stress.  Plan rest periods when fatigued.  Learn strategies to manage high temperatures. If the weather is extremely hot:  Avoid vigorous physical activity.  Use air conditioning or fans or seek a cooler location.  Avoid caffeine and alcohol.  Wear loose-fitting, lightweight, and light-colored clothing.  Learn strategies to manage cold temperatures. If the weather is extremely cold:  Avoid vigorous physical activity.  Layer clothes.  Wear mittens or gloves, a hat, and a scarf when going outside.  Avoid alcohol.  Obtain ongoing education and support as needed.  Participate or seek rehabilitation as needed to maintain or improve independence and quality of life. SEEK MEDICAL CARE IF:   Your weight increases by 03 lb/1.4 kg in 1 day or 05 lb/2.3 kg in a week.  You have increasing shortness of breath that is unusual for you.  You are unable to participate in your usual physical activities.  You tire easily.  You cough more than normal, especially with physical activity.  You have any or more swelling in areas such as your hands, feet, ankles, or abdomen.  You  are unable to sleep because it is hard to breathe.  You feel like your heart is beating fast (palpitations).  You become dizzy or lightheaded upon standing up. SEEK IMMEDIATE MEDICAL CARE IF:   You have difficulty breathing.  There is a change in mental status such as decreased alertness or difficulty with concentration.  You have a pain or discomfort in your chest.  You have an episode of fainting (syncope). MAKE SURE YOU:   Understand these instructions.  Will watch your condition.  Will get help right away if you are not doing well or get worse. Document Released: 07/28/2005 Document Revised: 11/22/2012 Document Reviewed: 08/19/2012 [employer] Patient Information 2014 [place], [place]. 2 Gram Low Sodium Diet A 2 gram sodium diet restricts the amount of sodium in the diet to no more than 2 g or 2000 mg daily. Limiting the amount of sodium is often used to help lower blood pressure. It is important if you have heart, liver, or kidney problems. Many foods contain sodium for flavor and sometimes as a preservative. When the amount of sodium in a diet needs to be low, it is important to know what to look for when choosing foods and drinks. The following includes some information and guidelines to help make it easier for you to adapt to a  low sodium diet. QUICK TIPS  Do not add salt to food.  Avoid convenience items and fast food.  Choose unsalted snack foods.  Buy lower sodium products, often labeled as "lower sodium" or "no salt added."  Check food labels to learn how much sodium is in 1 serving.  When eating at a restaurant, ask that your food be prepared with less salt or none, if possible. READING FOOD LABELS FOR SODIUM INFORMATION The nutrition facts label is a good place to find how much sodium is in foods. Look for products with no more than 500 to 600 mg of sodium per meal and no more than 150 mg per serving. Remember that 2 g = 2000 mg. The food label may also list foods  as:  Sodium-free: Less than 5 mg in a serving.  Very low sodium: 35 mg or less in a serving.  Low-sodium: 140 mg or less in a serving.  Light in sodium: 50% less sodium in a serving. For example, if a food that usually has 300 mg of sodium is changed to become light in sodium, it will have 150 mg of sodium.  Reduced sodium: 25% less sodium in a serving. For example, if a food that usually has 400 mg of sodium is changed to reduced sodium, it will have 300 mg of sodium. CHOOSING FOODS Grains  Avoid: Salted crackers and snack items. Some cereals, including instant hot cereals. Bread stuffing and biscuit mixes. Seasoned rice or pasta mixes.  Choose: Unsalted snack items. Low-sodium cereals, oats, puffed wheat and rice, shredded wheat. English muffins and bread. Pasta. Meats  Avoid: Salted, canned, smoked, spiced, pickled meats, including fish and poultry. Bacon, ham, sausage, cold cuts, hot dogs, anchovies.  Choose: Low-sodium canned tuna and salmon. Fresh or frozen meat, poultry, and fish. Dairy  Avoid: Processed cheese and spreads. Cottage cheese. Buttermilk and condensed milk. Regular cheese.  Choose: Milk. Low-sodium cottage cheese. Yogurt. Sour cream. Low-sodium cheese. Fruits and Vegetables  Avoid: Regular canned vegetables. Regular canned tomato sauce and paste. Frozen vegetables in sauces. Olives. Ricardo Zuniga. Relishes. Sauerkraut.  Choose: Low-sodium canned vegetables. Low-sodium tomato sauce and paste. Frozen or fresh vegetables. Fresh and frozen fruit. Condiments  Avoid: Canned and packaged gravies. Worcestershire sauce. Tartar sauce. Barbecue sauce. Soy sauce. Steak sauce. Ketchup. Onion, garlic, and table salt. Meat flavorings and tenderizers.  Choose: Fresh and dried herbs and spices. Low-sodium varieties of mustard and ketchup. Lemon juice. Tabasco sauce. Horseradish. SAMPLE 2 GRAM SODIUM MEAL PLAN Breakfast / Sodium (mg)  1 cup low-fat milk / 416 mg  2 slices  whole-wheat toast / 270 mg  1 tbs heart-healthy margarine / 153 mg  1 hard-boiled egg / 139 mg  1 small orange / 0 mg Lunch / Sodium (mg)  1 cup raw carrots / 76 mg   cup hummus / 298 mg  1 cup low-fat milk / 143 mg   cup red grapes / 2 mg  1 whole-wheat pita bread / 356 mg Dinner / Sodium (mg)  1 cup whole-wheat pasta / 2 mg  1 cup low-sodium tomato sauce / 73 mg  3 oz lean ground beef / 57 mg  1 small side salad (1 cup raw spinach leaves,  cup cucumber,  cup yellow bell pepper) with 1 tsp olive oil and 1 tsp red wine vinegar / 25 mg Snack / Sodium (mg)  1 container low-fat vanilla yogurt / 107 mg  3 graham cracker squares / 127 mg Nutrient Analysis  Calories:  2033  Protein: 77 g  Carbohydrate: 282 g  Fat: 72 g  Sodium: 1971 mg Document Released: 07/28/2005 Document Revised: 10/20/2011 Document Reviewed: 10/29/2009 [facility] Patient Information 2014 [place].

## 2013-12-14 NOTE — Discharge Summary (Signed)
Patient ID: Ricardo Zuniga,  MRN: [id number], DOB/AGE: 64-19-1952 64 y.o.  Admit date: 12/09/2013 Discharge date: 12/14/2013  Primary Care Provider: Dr Francesco Runner Primary Cardiologist: Dr Bronson Ing  Discharge Diagnoses Principal Problem:   Acute combined systolic and diastolic heart failure Active Problems:   Congestive dilated cardiomyopathy- EF 20% at cath 12/13/13   DM type 2 (diabetes mellitus, type 2)   APML (acute promyelocytic leukemia) in remission   Normal coronary arteries- cath 12/13/13   Dyslipidemia    Procedures: Rt and Lt cath 12/12/13   Hospital Course:  The patient is a 63 y/o male without history of definite coronary artery disease. He was followed by cardiology the past, last seen 07/2012 at which point catheterization was planned to exclude coronary artery disease based on abnormal Myoview ejection fraction. 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  Catheterization was canceled. Patient underwent treatment at [facility], completing treatment September 2014. He had been complaining of fatigue and DOE for the past month. He awakened 12/09/13 with SSCP associated with SOB and presented to the ER at [facility]. IN the ER his BNP was 1100 and his CXR suggested vascular congestion. Echo cardiogram revealed an EF of 25-30% with grade 2 diastolic dysfunction. He was transferred to [facility] for cath and further evaluation on 12/11/13. His cardiac markers were negative. Rt and Lt heart cath done 12/12/13 revealed non ischemic cardiomyopathy with an EF of 20%. His medications were adjusted as tolerated, his B/P was a little low during this hospitalization. On 12/14/13 he was seen by Dr Acie Fredrickson and felt to be stable for discharge. We discussed Life Vest with EP service. They felt as long as he had no high risk arrhythmias that this was not indicated. He will need follow echo in 3 months.    Discharge Vitals:  Blood pressure 107/66, pulse 90,  temperature 98.2 F (36.8 C), temperature source Oral, resp. rate 18, height $RemoveBe'5\' 9"'[id number]$  (1.753 m), weight 212 lb 1.3 oz (96.2 kg), SpO2 100.00%.    Labs: Results for orders placed during the hospital encounter of 12/09/13 (from the past 48 hour(s))  GLUCOSE, CAPILLARY     Status: Abnormal   Collection Time    12/12/13 11:24 AM      Result Value Ref Range   Glucose-Capillary 138 (*) 70 - 99 mg/dL  GLUCOSE, CAPILLARY     Status: Abnormal   Collection Time    12/12/13 12:43 PM      Result Value Ref Range   Glucose-Capillary 123 (*) 70 - 99 mg/dL  POCT I-STAT 3, ART BLOOD GAS (G3+)     Status: Abnormal   Collection Time    12/12/13  4:07 PM      Result Value Ref Range   pH, Arterial 7.394  7.350 - 7.450   pCO2 arterial 32.2 (*) 35.0 - 45.0 mmHg   pO2, Arterial 74.0 (*) 80.0 - 100.0 mmHg   Bicarbonate 19.6 (*) 20.0 - 24.0 mEq/L   TCO2 21  0 - 100 mmol/L   O2 Saturation 95.0     Acid-base deficit 4.0 (*) 0.0 - 2.0 mmol/L   Sample type ARTERIAL    POCT I-STAT 3, VENOUS BLOOD GAS (G3P V)     Status: Abnormal   Collection Time    12/12/13  4:10 PM      Result Value Ref Range   pH, Ven 7.343 (*) 7.250 - 7.300   pCO2, Ven 42.6 (*) 45.0 -  50.0 mmHg   pO2, Ven 32.0  30.0 - 45.0 mmHg   Bicarbonate 23.2  20.0 - 24.0 mEq/L   TCO2 24  0 - 100 mmol/L   O2 Saturation 58.0     Acid-base deficit 3.0 (*) 0.0 - 2.0 mmol/L   Sample type VENOUS     Comment NOTIFIED PHYSICIAN    POCT ACTIVATED CLOTTING TIME     Status: None   Collection Time    12/12/13  4:31 PM      Result Value Ref Range   Activated Clotting Time 149    GLUCOSE, CAPILLARY     Status: Abnormal   Collection Time    12/12/13  4:58 PM      Result Value Ref Range   Glucose-Capillary 121 (*) 70 - 99 mg/dL  GLUCOSE, CAPILLARY     Status: Abnormal   Collection Time    12/12/13  5:45 PM      Result Value Ref Range   Glucose-Capillary 142 (*) 70 - 99 mg/dL  GLUCOSE, CAPILLARY     Status: Abnormal   Collection Time    12/12/13   9:42 PM      Result Value Ref Range   Glucose-Capillary 207 (*) 70 - 99 mg/dL  BASIC METABOLIC PANEL     Status: Abnormal   Collection Time    12/13/13  2:45 AM      Result Value Ref Range   Sodium 138  137 - 147 mEq/L   Potassium 4.7  3.7 - 5.3 mEq/L   Chloride 102  96 - 112 mEq/L   CO2 22  19 - 32 mEq/L   Glucose, Bld 136 (*) 70 - 99 mg/dL   BUN 16  6 - 23 mg/dL   Creatinine, Ser 0.79  0.50 - 1.35 mg/dL   Calcium 9.1  8.4 - 10.5 mg/dL   GFR calc non Af Amer >90  >90 mL/min   GFR calc Af Amer >90  >90 mL/min   Comment: (NOTE)     The eGFR has been calculated using the CKD EPI equation.     This calculation has not been validated in all clinical situations.     eGFR's persistently <90 mL/min signify possible Chronic Kidney     Disease.  CBC     Status: None   Collection Time    12/13/13  2:45 AM      Result Value Ref Range   WBC 6.0  4.0 - 10.5 K/uL   RBC 5.09  4.22 - 5.81 MIL/uL   Hemoglobin 16.4  13.0 - 17.0 g/dL   HCT 46.0  39.0 - 52.0 %   MCV 90.4  78.0 - 100.0 fL   MCH 32.2  26.0 - 34.0 pg   MCHC 35.7  30.0 - 36.0 g/dL   RDW 13.8  11.5 - 15.5 %   Platelets 214  150 - 400 K/uL  GLUCOSE, CAPILLARY     Status: Abnormal   Collection Time    12/13/13  7:37 AM      Result Value Ref Range   Glucose-Capillary 147 (*) 70 - 99 mg/dL  GLUCOSE, CAPILLARY     Status: Abnormal   Collection Time    12/13/13 12:26 PM      Result Value Ref Range   Glucose-Capillary 178 (*) 70 - 99 mg/dL  GLUCOSE, CAPILLARY     Status: Abnormal   Collection Time    12/13/13  4:41 PM      Result Value  Ref Range   Glucose-Capillary 151 (*) 70 - 99 mg/dL  GLUCOSE, CAPILLARY     Status: Abnormal   Collection Time    12/13/13  9:05 PM      Result Value Ref Range   Glucose-Capillary 186 (*) 70 - 99 mg/dL  BASIC METABOLIC PANEL     Status: Abnormal   Collection Time    12/14/13  5:30 AM      Result Value Ref Range   Sodium 139  137 - 147 mEq/L   Potassium 4.3  3.7 - 5.3 mEq/L   Chloride 103   96 - 112 mEq/L   CO2 24  19 - 32 mEq/L   Glucose, Bld 142 (*) 70 - 99 mg/dL   BUN 16  6 - 23 mg/dL   Creatinine, Ser 4.87  0.50 - 1.35 mg/dL   Calcium 9.4  8.4 - 79.3 mg/dL   GFR calc non Af Amer >90  >90 mL/min   GFR calc Af Amer >90  >90 mL/min   Comment: (NOTE)     The eGFR has been calculated using the CKD EPI equation.     This calculation has not been validated in all clinical situations.     eGFR's persistently <90 mL/min signify possible Chronic Kidney     Disease.  GLUCOSE, CAPILLARY     Status: Abnormal   Collection Time    12/14/13  7:24 AM      Result Value Ref Range   Glucose-Capillary 137 (*) 70 - 99 mg/dL    Disposition:      Follow-up Information   Follow up with Laqueta Linden, MD On 12/21/2013. (4 pm)    Specialty:  Cardiology   Contact information:   [address] [place] [address] [phone]       Discharge Medications:    Medication List         ALPRAZolam 0.5 MG tablet  Commonly known as:  XANAX  Take 1 tablet by mouth 2 (two) times daily.     aspirin 81 MG chewable tablet  Chew 1 tablet (81 mg total) by mouth daily.     atorvastatin 80 MG tablet  Commonly known as:  LIPITOR  Take 1 tablet (80 mg total) by mouth daily at 6 PM.     carvedilol 3.125 MG tablet  Commonly known as:  COREG  Take 1 tablet (3.125 mg total) by mouth 2 (two) times daily with a meal.     fluticasone 50 MCG/ACT nasal spray  Commonly known as:  FLONASE  Place 2 sprays into both nostrils daily.     furosemide 20 MG tablet  Commonly known as:  LASIX  Take 1 tablet (20 mg total) by mouth daily.     glimepiride 4 MG tablet  Commonly known as:  AMARYL  Take 4 mg by mouth daily.     INVOKANA 300 MG Tabs  Generic drug:  Canagliflozin  Take 1 tablet by mouth daily.     isosorbide mononitrate 30 MG 24 hr tablet  Commonly known as:  IMDUR  Take 1 tablet (30 mg total) by mouth daily.     Linagliptin-Metformin HCl 2.5-500 MG Tabs  Commonly known  as:  JENTADUETO  Take 1-2 tablets by mouth 2 (two) times daily. Take 2 tabs every morning & 1 tab every evening  Start taking on:  12/15/2013     Loratadine 10 MG Caps  Take 1 capsule by mouth daily.     losartan  25 MG tablet  Commonly known as:  COZAAR  Take 1 tablet (25 mg total) by mouth daily.     pantoprazole 40 MG tablet  Commonly known as:  PROTONIX  Take 1 tablet (40 mg total) by mouth daily.         Duration of Discharge Encounter: Greater than 30 minutes including physician time.  Angelena Form PA-C 12/14/2013 8:28 AM  See my note from earlier this same day   Ramond Dial., MD, [facility] 12/14/2013, 1:47 PM Office - [phone] Pager [phone]

## 2013-12-16 LAB — IMMUNOFIXATION ELECTROPHORESIS
IgA: 340 mg/dL (ref 68–379)
IgG (Immunoglobin G), Serum: 588 mg/dL — ABNORMAL LOW (ref 650–1600)
IgM, Serum: 104 mg/dL (ref 41–251)
Total Protein ELP: 6.5 g/dL (ref 6.0–8.3)

## 2013-12-19 ENCOUNTER — Telehealth: Payer: Self-pay | Admitting: Cardiovascular Disease

## 2013-12-19 NOTE — Telephone Encounter (Signed)
Wife [name]) notified.

## 2013-12-19 NOTE — Telephone Encounter (Signed)
I would stop losartan for the time being to see if this helps.

## 2013-12-19 NOTE — Telephone Encounter (Signed)
Patient's BP  113/79 after medication 80/42 and 80/50 at 11:15.  Describes feeling foggy and little sick on his stomach. Took normal medication

## 2013-12-19 NOTE — Telephone Encounter (Signed)
Discussed with wife [name]) - states BP running low.  No c/o dizziness, chest pain, or SOB.  Complains of feeling "foggy".  BP at 12:00 was 89/53  85.  Patient has post hosp scheduled for 5/13 in [place] office.  Advised message will be sent to provider for advice.

## 2013-12-21 ENCOUNTER — Encounter: Payer: Self-pay | Admitting: Cardiovascular Disease

## 2013-12-21 ENCOUNTER — Ambulatory Visit (INDEPENDENT_AMBULATORY_CARE_PROVIDER_SITE_OTHER): Payer: BC Managed Care – PPO | Admitting: Cardiovascular Disease

## 2013-12-21 VITALS — BP 122/50 | HR 74 | Ht 67.0 in | Wt 209.0 lb

## 2013-12-21 DIAGNOSIS — I5022 Chronic systolic (congestive) heart failure: Secondary | ICD-10-CM

## 2013-12-21 DIAGNOSIS — E785 Hyperlipidemia, unspecified: Secondary | ICD-10-CM

## 2013-12-21 DIAGNOSIS — I420 Dilated cardiomyopathy: Secondary | ICD-10-CM

## 2013-12-21 DIAGNOSIS — I10 Essential (primary) hypertension: Secondary | ICD-10-CM

## 2013-12-21 DIAGNOSIS — I519 Heart disease, unspecified: Secondary | ICD-10-CM

## 2013-12-21 DIAGNOSIS — I428 Other cardiomyopathies: Secondary | ICD-10-CM

## 2013-12-21 DIAGNOSIS — R079 Chest pain, unspecified: Secondary | ICD-10-CM

## 2013-12-21 MED ORDER — FUROSEMIDE 20 MG PO TABS: 20.0000 mg | ORAL_TABLET | Freq: Every day | ORAL | Status: DC

## 2013-12-21 MED ORDER — ISOSORBIDE DINITRATE 10 MG PO TABS: 10.0000 mg | ORAL_TABLET | Freq: Two times a day (BID) | ORAL | Status: DC

## 2013-12-21 MED ORDER — LOSARTAN POTASSIUM 25 MG PO TABS: 12.5000 mg | ORAL_TABLET | Freq: Every day | ORAL | Status: DC

## 2013-12-21 NOTE — Patient Instructions (Signed)
Your physician recommends that you schedule a follow-up appointment in: 1 month with Dr Bronson Ing  Your physician has recommended you make the following change in your medication:   Stop Imdur Start Isosorbide Dinitrate 10 mg twice a day  Start Losartan 12.5 mg daily  If blood pressure gets too low, please stop Losartan  If weight gain over 3 lbs in a day take extra lasix .

## 2013-12-21 NOTE — Progress Notes (Signed)
Patient ID: MAZE CORNIEL, male   DOB: June 22, 1951, 63 y.o.   MRN: [id number]      SUBJECTIVE: The patient is here for a post hospitalization follow up visit. I recently consulted on him in the [facility] ED for chest pain, acute systolic CHF, and tachycardia. Echocardiography demonstrated that left ventricular systolic function was severely reduced, EF 25-30%, with mild to moderate aortic regurgitation and mild mitral regurgitation. I started Toprol-XL 25 mg daily and Lasix 20 mg daily. I did not start ARB's at that time as his blood pressure was low normal. Coronary angiography demonstrated a 50-60% irregular RCA lesion. Ultimately, he was deemed to have a severe nonischemic cardiomyopathy. His LVEDP was significantly elevated. At the time of discharge, he was placed on carvedilol 3.125 mg twice daily, losartan 25 mg daily, Imdur 30 mg daily, Lasix 20 mg daily, aspirin, and Lipitor 80 mg daily. EP was consulted and it was determined he did not require a life vest as no ventricular arrhythmias were noted throughout the course of his hospitalization.  Afterwards while at home, he began to feel "foggy" and systolic readings were in the high 80 mmHg range. Losartan was subsequently held. Since losartan was stopped, he has felt very well and denies chest pain, shortness of breath, orthopnea, leg swelling, dizziness, lightheadedness, and paroxysmal nocturnal dyspnea. Blood pressures have remained in the low 100 to high 90 mm mercury systolic range. Today blood pressure in the office is 120/50 with a heart rate of 74 beats per minute.  He would like to go with his wife and grandson to the beach either this weekend or early next week. He and his wife have been monitoring his blood pressure and heart rate meticulously. He adheres to a low-sodium diet.    Allergies  Allergen Reactions  . Lisinopril Cough  . Pseudoephedrine Other (See Comments)    Delirium    Current Outpatient Prescriptions    Medication Sig Dispense Refill  . ALPRAZolam (XANAX) 0.5 MG tablet Take 1 tablet by mouth 2 (two) times daily.      Marland Kitchen aspirin 81 MG chewable tablet Chew 1 tablet (81 mg total) by mouth daily.      Marland Kitchen atorvastatin (LIPITOR) 80 MG tablet Take 1 tablet (80 mg total) by mouth daily at 6 PM.  30 tablet  11  . carvedilol (COREG) 3.125 MG tablet Take 1 tablet (3.125 mg total) by mouth 2 (two) times daily with a meal.  60 tablet  11  . fluticasone (FLONASE) 50 MCG/ACT nasal spray Place 2 sprays into both nostrils daily.      . furosemide (LASIX) 20 MG tablet Take 1 tablet (20 mg total) by mouth daily.  30 tablet  11  . glimepiride (AMARYL) 4 MG tablet Take 4 mg by mouth daily.      . INVOKANA 300 MG TABS Take 1 tablet by mouth daily.      . isosorbide mononitrate (IMDUR) 30 MG 24 hr tablet Take 1 tablet (30 mg total) by mouth daily.  30 tablet  11  . Linagliptin-Metformin HCl (JENTADUETO) 2.5-500 MG TABS Take 1-2 tablets by mouth 2 (two) times daily. Take 2 tabs every morning & 1 tab every evening  60 tablet    . Loratadine 10 MG CAPS Take 1 capsule by mouth daily.      . pantoprazole (PROTONIX) 40 MG tablet Take 1 tablet (40 mg total) by mouth daily.  30 tablet  11   No current facility-administered medications for  this visit.    Past Medical History  Diagnosis Date  . Hypertension   . Anal fissure   . Ureteral colic   . Nephrolithiasis   . Gastroesophageal reflux disease   . Peptic ulcer disease     remote  . DM type 2 (diabetes mellitus, type 2)   . APML (acute promyelocytic leukemia) in remission     completed treatment 04/2013  . Heart murmur   . Difficult intubation     Past Surgical History  Procedure Laterality Date  . Anal fissure repair    . Lumbar fusion    . Cholecystectomy    . Cervical fusion    . Knee surgery      x3  . Shoulder surgery      x3  . Cataract extraction, bilateral    . Appendectomy      History   Social History  . Marital Status: Married     Spouse Name: N/A    Number of Children: N/A  . Years of Education: N/A   Occupational History  . [employer]  . SECURITY GUARD    Social History Main Topics  . Smoking status: Former Smoker    Quit date: 08/11/1985  . Smokeless tobacco: Never Used  . Alcohol Use: No  . Drug Use: No  . Sexual Activity: Not on file   Other Topics Concern  . Not on file   Social History Narrative  . No narrative on file   BP 122/50 Pulse 74    PHYSICAL EXAM General: NAD Neck: No JVD, no thyromegaly. Lungs: Clear to auscultation bilaterally with normal respiratory effort. CV: Nondisplaced PMI.  Regular rate and rhythm, normal S1/S2, no S3/S4, no murmur. No pretibial or periankle edema.  No carotid bruit.  Normal pedal pulses.  Abdomen: Soft, nontender, no hepatosplenomegaly, no distention.  Neurologic: Alert and oriented x 3.  Psych: Normal affect. Extremities: No clubbing or cyanosis.   ECG: reviewed and available in electronic records.      ASSESSMENT AND PLAN: 1. Severe nonischemic cardiomyopathy/chronic systolic heart failure: He is currently compensated and euvolemic. He is intolerant of ACEI's as they caused a dry cough. In order to maximize his medical therapy, I will switch Imdur to isosorbide dinitrate 10 mg twice daily, thus effectively reducing his total dose by 10 mg. I will then reintroduced losartan at a very low dose of 12.5 mg daily to see if his blood pressure tolerates this. I have instructed him to take an extra tablet of Lasix should he experience increasing shortness of breath, leg swelling, or weight gain, and to inform me if this does occur. IgG was mildly low at 588. His cardiomyopathy may be related to a viral etiology or a tachycardia mediated etiology. I plan to repeat an echocardiogram in 3-4 months, after I am able to maximize his medical therapy. 2. Hypertension: Well controlled on current regimen.  Dispo: f/u 1 month.  Kate Sable,  M.D., F.A.C.C.

## 2014-01-20 ENCOUNTER — Encounter: Payer: Self-pay | Admitting: Cardiovascular Disease

## 2014-01-20 ENCOUNTER — Ambulatory Visit (INDEPENDENT_AMBULATORY_CARE_PROVIDER_SITE_OTHER): Payer: BC Managed Care – PPO | Admitting: Cardiovascular Disease

## 2014-01-20 VITALS — BP 104/67 | HR 80 | Ht 67.0 in | Wt 196.0 lb

## 2014-01-20 DIAGNOSIS — I351 Nonrheumatic aortic (valve) insufficiency: Secondary | ICD-10-CM

## 2014-01-20 DIAGNOSIS — I519 Heart disease, unspecified: Secondary | ICD-10-CM

## 2014-01-20 DIAGNOSIS — I420 Dilated cardiomyopathy: Secondary | ICD-10-CM

## 2014-01-20 DIAGNOSIS — I428 Other cardiomyopathies: Secondary | ICD-10-CM

## 2014-01-20 DIAGNOSIS — E785 Hyperlipidemia, unspecified: Secondary | ICD-10-CM

## 2014-01-20 DIAGNOSIS — I359 Nonrheumatic aortic valve disorder, unspecified: Secondary | ICD-10-CM

## 2014-01-20 DIAGNOSIS — I2510 Atherosclerotic heart disease of native coronary artery without angina pectoris: Secondary | ICD-10-CM

## 2014-01-20 DIAGNOSIS — I10 Essential (primary) hypertension: Secondary | ICD-10-CM

## 2014-01-20 DIAGNOSIS — I5022 Chronic systolic (congestive) heart failure: Secondary | ICD-10-CM

## 2014-01-20 NOTE — Patient Instructions (Signed)
Continue all current medications. Follow up in  3 months 

## 2014-01-20 NOTE — Progress Notes (Signed)
Patient ID: Ricardo Zuniga, male   DOB: 13-Feb-1951, 63 y.o.   MRN: [id number]      SUBJECTIVE: Ricardo Zuniga is doing very well. He denies chest pain. He does get short of breath with exertion, particularly when it is hot and humid outside. He has had a few "weak spells" but noticed his blood sugar was in the 50's during those times. He denies leg swelling, orthopnea, PND, and syncope.  He feels much better since his last visit.    Allergies  Allergen Reactions  . Lisinopril Cough  . Pseudoephedrine Other (See Comments)    Delirium    Current Outpatient Prescriptions  Medication Sig Dispense Refill  . ALPRAZolam (XANAX) 0.5 MG tablet Take 0.5 mg by mouth at bedtime as needed.       Marland Kitchen aspirin 81 MG chewable tablet Chew 1 tablet (81 mg total) by mouth daily.      Marland Kitchen atorvastatin (LIPITOR) 80 MG tablet Take 1 tablet (80 mg total) by mouth daily at 6 PM.  30 tablet  11  . carvedilol (COREG) 3.125 MG tablet Take 1 tablet (3.125 mg total) by mouth 2 (two) times daily with a meal.  60 tablet  11  . fluticasone (FLONASE) 50 MCG/ACT nasal spray Place 2 sprays into both nostrils daily as needed.       . furosemide (LASIX) 20 MG tablet Take 1 tablet (20 mg total) by mouth daily. and as advised.  45 tablet  11  . INVOKANA 300 MG TABS Take 1 tablet by mouth daily.      . isosorbide dinitrate (ISORDIL) 10 MG tablet Take 1 tablet (10 mg total) by mouth 2 (two) times daily.  60 tablet  6  . Loratadine 10 MG CAPS Take 1 capsule by mouth as needed.       Marland Kitchen losartan (COZAAR) 25 MG tablet Take 0.5 tablets (12.5 mg total) by mouth daily.  30 tablet  6  . omeprazole (PRILOSEC OTC) 20 MG tablet Take 20 mg by mouth daily as needed.       No current facility-administered medications for this visit.    Past Medical History  Diagnosis Date  . Hypertension   . Anal fissure   . Ureteral colic   . Nephrolithiasis   . Gastroesophageal reflux disease   . Peptic ulcer disease     remote  . DM type 2  (diabetes mellitus, type 2)   . APML (acute promyelocytic leukemia) in remission     completed treatment 04/2013  . Heart murmur   . Difficult intubation     Past Surgical History  Procedure Laterality Date  . Anal fissure repair    . Lumbar fusion    . Cholecystectomy    . Cervical fusion    . Knee surgery      x3  . Shoulder surgery      x3  . Cataract extraction, bilateral    . Appendectomy      History   Social History  . Marital Status: Married    Spouse Name: N/A    Number of Children: N/A  . Years of Education: N/A   Occupational History  . [employer]  . SECURITY GUARD    Social History Main Topics  . Smoking status: Former Smoker    Quit date: 08/11/1985  . Smokeless tobacco: Never Used  . Alcohol Use: No  . Drug Use: No  . Sexual Activity: Not on file  Other Topics Concern  . Not on file   Social History Narrative  . No narrative on file     Filed Vitals:   01/20/14 1612  BP: 104/67  Pulse: 80  Height: 5\' 7"  (1.702 m)  Weight: 196 lb (88.905 kg)    PHYSICAL EXAM General: NAD Neck: No JVD, no thyromegaly. Lungs: Clear to auscultation bilaterally with normal respiratory effort. CV: Nondisplaced PMI.  Regular rate and rhythm, normal S1/S2, no S3/S4, no murmur. No pretibial or periankle edema.  No carotid bruit.  Normal pedal pulses.  Abdomen: Soft, nontender, no hepatosplenomegaly, no distention.  Neurologic: Alert and oriented x 3.  Psych: Normal affect. Extremities: No clubbing or cyanosis.   ECG: reviewed and available in electronic records.      ASSESSMENT AND PLAN: 1. Severe nonischemic cardiomyopathy/chronic systolic heart failure: He is currently compensated and euvolemic. He is intolerant of ACEI's as they caused a dry cough. Continue isosorbide dinitrate 10 mg twice daily along with losartan at a very low dose of 12.5 mg daily. I have instructed him to take an extra tablet of Lasix should he experience  increasing shortness of breath, leg swelling, or weight gain, and to inform me if this does occur. IgG was mildly low at 588. His cardiomyopathy may be related to a viral etiology or a tachycardia mediated etiology. I plan to repeat an echocardiogram in 3 months, after I am able to maximize his medical therapy.  2. Hypertension: Well controlled on current regimen.  3. CAD with 50-60% RCA lesion: Symptomatically stable. No changes to medication regimen which include ASA and statin. 4. Valvular heart disease: Stable. Will continue to monitor.  Dispo: f/u in September.  Kate Sable, M.D., F.A.C.C.

## 2014-05-04 ENCOUNTER — Encounter: Payer: Self-pay | Admitting: Cardiovascular Disease

## 2014-05-04 ENCOUNTER — Ambulatory Visit (INDEPENDENT_AMBULATORY_CARE_PROVIDER_SITE_OTHER): Payer: BC Managed Care – PPO | Admitting: Cardiovascular Disease

## 2014-05-04 VITALS — BP 136/85 | HR 84 | Ht 69.0 in | Wt 189.0 lb

## 2014-05-04 DIAGNOSIS — I359 Nonrheumatic aortic valve disorder, unspecified: Secondary | ICD-10-CM

## 2014-05-04 DIAGNOSIS — I519 Heart disease, unspecified: Secondary | ICD-10-CM

## 2014-05-04 DIAGNOSIS — I5022 Chronic systolic (congestive) heart failure: Secondary | ICD-10-CM

## 2014-05-04 DIAGNOSIS — I428 Other cardiomyopathies: Secondary | ICD-10-CM

## 2014-05-04 DIAGNOSIS — E785 Hyperlipidemia, unspecified: Secondary | ICD-10-CM

## 2014-05-04 DIAGNOSIS — I429 Cardiomyopathy, unspecified: Secondary | ICD-10-CM

## 2014-05-04 DIAGNOSIS — I351 Nonrheumatic aortic (valve) insufficiency: Secondary | ICD-10-CM

## 2014-05-04 DIAGNOSIS — I10 Essential (primary) hypertension: Secondary | ICD-10-CM

## 2014-05-04 NOTE — Progress Notes (Signed)
Patient ID: Ricardo Zuniga, male   DOB: 09/23/1950, 63 y.o.   MRN: [id number]      SUBJECTIVE: Ricardo Zuniga continues to do well, and denies chest pain, shortness of breath, orthopnea, leg swelling, palpitations and paroxysmal nocturnal dyspnea. He and his wife are building a new home on 7 acres of land and this has kept him quite busy. He has also been helping to take care of his 26-year-old granddaughter and the combination of these events have made things somewhat stressful. BP's at home average 95/60 mmHg.  Review of Systems: As per "subjective", otherwise negative.  Allergies  Allergen Reactions  . Lisinopril Cough  . Pseudoephedrine Other (See Comments)    Delirium    Current Outpatient Prescriptions  Medication Sig Dispense Refill  . ALPRAZolam (XANAX) 0.5 MG tablet Take 0.5 mg by mouth at bedtime as needed.       Marland Kitchen aspirin 81 MG chewable tablet Chew 1 tablet (81 mg total) by mouth daily.      Marland Kitchen atorvastatin (LIPITOR) 80 MG tablet Take 1 tablet (80 mg total) by mouth daily at 6 PM.  30 tablet  11  . carvedilol (COREG) 3.125 MG tablet Take 1 tablet (3.125 mg total) by mouth 2 (two) times daily with a meal.  60 tablet  11  . fluticasone (FLONASE) 50 MCG/ACT nasal spray Place 2 sprays into both nostrils daily as needed.       . furosemide (LASIX) 20 MG tablet Take 1 tablet (20 mg total) by mouth daily. and as advised.  45 tablet  11  . INVOKANA 300 MG TABS Take 1 tablet by mouth daily.      . isosorbide dinitrate (ISORDIL) 10 MG tablet Take 1 tablet (10 mg total) by mouth 2 (two) times daily.  60 tablet  6  . Loratadine 10 MG CAPS Take 1 capsule by mouth as needed.       Marland Kitchen losartan (COZAAR) 25 MG tablet Take 0.5 tablets (12.5 mg total) by mouth daily.  30 tablet  6  . omeprazole (PRILOSEC OTC) 20 MG tablet Take 20 mg by mouth daily as needed.       No current facility-administered medications for this visit.    Past Medical History  Diagnosis Date  . Hypertension   . Anal  fissure   . Ureteral colic   . Nephrolithiasis   . Gastroesophageal reflux disease   . Peptic ulcer disease     remote  . DM type 2 (diabetes mellitus, type 2)   . APML (acute promyelocytic leukemia) in remission     completed treatment 04/2013  . Heart murmur   . Difficult intubation     Past Surgical History  Procedure Laterality Date  . Anal fissure repair    . Lumbar fusion    . Cholecystectomy    . Cervical fusion    . Knee surgery      x3  . Shoulder surgery      x3  . Cataract extraction, bilateral    . Appendectomy      History   Social History  . Marital Status: Married    Spouse Name: N/A    Number of Children: N/A  . Years of Education: N/A   Occupational History  . [employer]  . SECURITY GUARD    Social History Main Topics  . Smoking status: Former Smoker    Start date: 02/17/1968    Quit date: 02/28/1986  .  Smokeless tobacco: Never Used  . Alcohol Use: No  . Drug Use: No  . Sexual Activity: Not on file   Other Topics Concern  . Not on file   Social History Narrative  . No narrative on file     Filed Vitals:   05/04/14 0945  BP: 136/85  Pulse: 84  Height: 5\' 9"  (1.753 m)  Weight: 189 lb (85.73 kg)  SpO2: 98%    PHYSICAL EXAM General: NAD HEENT: Normal. Neck: No JVD, no thyromegaly. Lungs: Clear to auscultation bilaterally with normal respiratory effort. CV: Nondisplaced PMI.  Regular rate and rhythm, normal S1/S2, no S3/S4, no murmur. No pretibial or periankle edema.  No carotid bruit.  Normal pedal pulses.  Abdomen: Soft, nontender, no hepatosplenomegaly, no distention.  Neurologic: Alert and oriented x 3.  Psych: Normal affect. Skin: Normal. Musculoskeletal: Normal range of motion, no gross deformities. Extremities: No clubbing or cyanosis.   ECG: Most recent ECG reviewed.      ASSESSMENT AND PLAN: 1. Severe nonischemic cardiomyopathy/chronic systolic heart failure: He is currently compensated and  euvolemic. He is intolerant of ACEI's as they caused a dry cough. Continue isosorbide dinitrate 10 mg twice daily along with losartan at a very low dose of 12.5 mg daily and Coreg 3.125 mg bid. I have instructed him to take an extra tablet of Lasix should he experience increasing shortness of breath, leg swelling, or weight gain, and to inform me if this does occur. IgG was mildly low at 588. His cardiomyopathy may be related to a viral etiology (prior infections related to Hickman catheter) or a tachycardia mediated etiology. I plan to repeat an echocardiogram to see if there has been interval improvement in LV systolic function. If not, I would consider EP evaluation for ICD. I would also consider starting Corlanor for improved HR control. 2. Essential hypertension: Well controlled on current regimen.  3. CAD with 50-60% RCA lesion: Symptomatically stable. No changes to medication regimen which include ASA and statin.  4. Valvular heart disease: Stable. Will continue to monitor.   Dispo: f/u 3 months.  Kate Sable, M.D., F.A.C.C.

## 2014-05-04 NOTE — Patient Instructions (Signed)
There were no changes to your medications. Continue as directed. Your physician has requested that you have an echocardiogram. Echocardiography is a painless test that uses sound waves to create images of your heart. It provides your doctor with information about the size and shape of your heart and how well your heart's chambers and valves are working. This procedure takes approximately one hour. There are no restrictions for this procedure. Office will contact with results via phone or letter.   Follow up in  3-4 months.

## 2014-05-24 ENCOUNTER — Other Ambulatory Visit (INDEPENDENT_AMBULATORY_CARE_PROVIDER_SITE_OTHER): Payer: BC Managed Care – PPO

## 2014-05-24 ENCOUNTER — Telehealth: Payer: Self-pay | Admitting: *Deleted

## 2014-05-24 ENCOUNTER — Other Ambulatory Visit: Payer: Self-pay

## 2014-05-24 DIAGNOSIS — I429 Cardiomyopathy, unspecified: Secondary | ICD-10-CM

## 2014-05-24 DIAGNOSIS — I351 Nonrheumatic aortic (valve) insufficiency: Secondary | ICD-10-CM

## 2014-05-24 DIAGNOSIS — I5022 Chronic systolic (congestive) heart failure: Secondary | ICD-10-CM

## 2014-05-24 NOTE — Telephone Encounter (Signed)
Pt informed of results.

## 2014-05-24 NOTE — Telephone Encounter (Signed)
Message copied by Orion Modest on Wed May 24, 2014 11:50 AM ------      Message from: Kate Sable A      Created: Wed May 24, 2014 11:46 AM       Improved LV function when compared to prior echo. ------

## 2014-07-10 ENCOUNTER — Other Ambulatory Visit: Payer: Self-pay | Admitting: Cardiology

## 2014-07-10 MED ORDER — ISOSORBIDE DINITRATE 10 MG PO TABS: 10.0000 mg | ORAL_TABLET | Freq: Two times a day (BID) | ORAL | Status: DC

## 2014-07-10 NOTE — Telephone Encounter (Signed)
Received fax refill request  Rx # [id number] Medication:  Isosorb DIN 10 mg tab Qty 60 Sig:  Take one tablet by mouth twice daily Physician:  Bronson Ing

## 2014-07-20 ENCOUNTER — Encounter (HOSPITAL_COMMUNITY): Payer: Self-pay | Admitting: Cardiology

## 2014-08-08 ENCOUNTER — Ambulatory Visit (INDEPENDENT_AMBULATORY_CARE_PROVIDER_SITE_OTHER): Payer: BC Managed Care – PPO | Admitting: Cardiovascular Disease

## 2014-08-08 ENCOUNTER — Encounter: Payer: Self-pay | Admitting: Cardiovascular Disease

## 2014-08-08 VITALS — BP 125/71 | HR 88 | Ht 69.0 in | Wt 191.0 lb

## 2014-08-08 DIAGNOSIS — I5022 Chronic systolic (congestive) heart failure: Secondary | ICD-10-CM

## 2014-08-08 DIAGNOSIS — I351 Nonrheumatic aortic (valve) insufficiency: Secondary | ICD-10-CM

## 2014-08-08 DIAGNOSIS — I429 Cardiomyopathy, unspecified: Secondary | ICD-10-CM

## 2014-08-08 DIAGNOSIS — I2510 Atherosclerotic heart disease of native coronary artery without angina pectoris: Secondary | ICD-10-CM

## 2014-08-08 DIAGNOSIS — E785 Hyperlipidemia, unspecified: Secondary | ICD-10-CM

## 2014-08-08 MED ORDER — LOSARTAN POTASSIUM 25 MG PO TABS: 12.5000 mg | ORAL_TABLET | Freq: Every evening | ORAL | Status: DC

## 2014-08-08 NOTE — Patient Instructions (Signed)
   Stop Imdur  Change Losartan to take in the evening. Continue all other medications.   Your physician wants you to follow up in: 6 months.  You will receive a reminder letter in the mail one-two months in advance.  If you don't receive a letter, please call our office to schedule the follow up appointment

## 2014-08-08 NOTE — Addendum Note (Signed)
Addended by: Laurine Blazer on: 08/08/2014 11:29 AM   Modules accepted: Orders, Medications, Level of Service

## 2014-08-08 NOTE — Progress Notes (Signed)
Patient ID: BLANDON OFFERDAHL, male   DOB: 12/22/1950, 63 y.o.   MRN: [id number]      SUBJECTIVE: The patient presents for follow-up for a non-ischemic cardiomyopathy and chronic systolic heart failure. Repeat echocardiogram in October 2015 demonstrated improvement of left ventricular systolic function with EF 45-50%, which had previously been 25-30%. There was mild LVH and grade 1 diastolic dysfunction with mild aortic regurgitation. He is feeling much better and his functional capacity has improved tremendously. He denies chest pain, shortness of breath, orthopnea, paroxysmal nocturnal dyspnea, and leg swelling. He has had a few episodes where his systolic blood pressure has been in the 80 mmHg range and he has felt weak but with a short bit of rest, his symptoms improve.  He and his wife are waiting to move into their new house which is on 7 acres and located a short distance from this office.   Review of Systems: As per "subjective", otherwise negative.  Allergies  Allergen Reactions  . Lisinopril Cough  . Pseudoephedrine Other (See Comments)    Delirium    Current Outpatient Prescriptions  Medication Sig Dispense Refill  . ALPRAZolam (XANAX) 0.5 MG tablet Take 0.5 mg by mouth at bedtime as needed.     Marland Kitchen aspirin 81 MG chewable tablet Chew 1 tablet (81 mg total) by mouth daily.    Marland Kitchen atorvastatin (LIPITOR) 80 MG tablet Take 1 tablet (80 mg total) by mouth daily at 6 PM. 30 tablet 11  . carvedilol (COREG) 3.125 MG tablet Take 1 tablet (3.125 mg total) by mouth 2 (two) times daily with a meal. 60 tablet 11  . fluticasone (FLONASE) 50 MCG/ACT nasal spray Place 2 sprays into both nostrils daily as needed.     . furosemide (LASIX) 20 MG tablet Take 1 tablet (20 mg total) by mouth daily. and as advised. 45 tablet 11  . INVOKANA 300 MG TABS Take 1 tablet by mouth daily. Take half tab once a day    . isosorbide dinitrate (ISORDIL) 10 MG tablet Take 1 tablet (10 mg total) by mouth 2 (two) times  daily. 60 tablet 6  . Loratadine 10 MG CAPS Take 1 capsule by mouth as needed.     Marland Kitchen losartan (COZAAR) 25 MG tablet Take 0.5 tablets (12.5 mg total) by mouth daily. 30 tablet 6  . omeprazole (PRILOSEC OTC) 20 MG tablet Take 20 mg by mouth daily as needed.     No current facility-administered medications for this visit.    Past Medical History  Diagnosis Date  . Hypertension   . Anal fissure   . Ureteral colic   . Nephrolithiasis   . Gastroesophageal reflux disease   . Peptic ulcer disease     remote  . DM type 2 (diabetes mellitus, type 2)   . APML (acute promyelocytic leukemia) in remission     completed treatment 04/2013  . Heart murmur   . Difficult intubation     Past Surgical History  Procedure Laterality Date  . Anal fissure repair    . Lumbar fusion    . Cholecystectomy    . Cervical fusion    . Knee surgery      x3  . Shoulder surgery      x3  . Cataract extraction, bilateral    . Appendectomy    . Left and right heart catheterization with coronary angiogram N/A 12/12/2013    Procedure: LEFT AND RIGHT HEART CATHETERIZATION WITH CORONARY ANGIOGRAM;  Surgeon: [name], MD;  Location: [facility];  Service: Cardiovascular;  Laterality: N/A;    History   Social History  . Marital Status: Married    Spouse Name: N/A    Number of Children: N/A  . Years of Education: N/A   Occupational History  . [employer]  . SECURITY GUARD    Social History Main Topics  . Smoking status: Former Smoker    Start date: 02/17/1968    Quit date: 02/28/1986  . Smokeless tobacco: Never Used  . Alcohol Use: No  . Drug Use: No  . Sexual Activity: Yes   Other Topics Concern  . Not on file   Social History Narrative     Filed Vitals:   08/08/14 1057  BP: 125/71  Pulse: 88  Height: 5\' 9"  (1.753 m)  Weight: 191 lb (86.637 kg)  SpO2: 98%    PHYSICAL EXAM General: NAD HEENT: Normal. Neck: No JVD, no thyromegaly. Lungs: Clear to auscultation  bilaterally with normal respiratory effort. CV: Nondisplaced PMI.  Regular rate and rhythm, normal S1/S2, no S3/S4, no murmur. No pretibial or periankle edema.  No carotid bruit.  Normal pedal pulses.  Abdomen: Soft, nontender, no hepatosplenomegaly, no distention.  Neurologic: Alert and oriented x 3.  Psych: Normal affect. Skin: Normal. Musculoskeletal: Normal range of motion, no gross deformities. Extremities: No clubbing or cyanosis.   ECG: Most recent ECG reviewed.      ASSESSMENT AND PLAN: 1. Nonischemic cardiomyopathy/chronic systolic heart failure: He is currently compensated and euvolemic. He is intolerant of ACEI's as they caused a dry cough. Given the aforementioned symptoms and improvement in cardiac function, I will discontinue isosorbide dinitrate. I have instructed him to take losartan at a very low dose of 12.5 mg daily every evening and Coreg 3.125 mg bid. I have instructed him to take an extra tablet of Lasix should he experience increasing shortness of breath, leg swelling, or weight gain, and to inform me if this does occur. IgG was mildly low at 588. His cardiomyopathy may be related to a viral etiology (prior infections related to Hickman catheter) or a tachycardia mediated etiology. There has been interval improvement in LV systolic function.  2. Essential hypertension: Well controlled on current regimen. Will monitor as I am discontinuing Isordil.  3. CAD with 50-60% RCA lesion: Symptomatically stable. No changes to medication regimen which include ASA and statin.   4. Valvular heart disease: Stable. Will continue to monitor.   Dispo: f/u 6 months.   Kate Sable, M.D., F.A.C.C.

## 2014-12-05 ENCOUNTER — Other Ambulatory Visit: Payer: Self-pay | Admitting: Cardiovascular Disease

## 2014-12-05 MED ORDER — CARVEDILOL 3.125 MG PO TABS: 3.1250 mg | ORAL_TABLET | Freq: Two times a day (BID) | ORAL | Status: DC

## 2014-12-05 MED ORDER — FUROSEMIDE 20 MG PO TABS: 20.0000 mg | ORAL_TABLET | Freq: Every day | ORAL | Status: DC

## 2014-12-05 MED ORDER — ATORVASTATIN CALCIUM 80 MG PO TABS: 80.0000 mg | ORAL_TABLET | Freq: Every day | ORAL | Status: DC

## 2014-12-05 MED ORDER — LOSARTAN POTASSIUM 25 MG PO TABS: 12.5000 mg | ORAL_TABLET | Freq: Every evening | ORAL | Status: DC

## 2014-12-05 NOTE — Telephone Encounter (Signed)
Mr. Canela needs to have his medications refilled. He wants all prescriptions called to [facility] in [place]. [place]

## 2014-12-05 NOTE — Telephone Encounter (Signed)
Medications sent to pharmacy

## 2015-01-02 ENCOUNTER — Telehealth: Payer: Self-pay | Admitting: *Deleted

## 2015-01-02 NOTE — Telephone Encounter (Signed)
Office note from Oncology - Dr. Sabas Sous - dated 01/01/2015 - see "care everywhere" for document.

## 2015-02-06 ENCOUNTER — Ambulatory Visit (INDEPENDENT_AMBULATORY_CARE_PROVIDER_SITE_OTHER): Payer: Medicare PPO | Admitting: Cardiovascular Disease

## 2015-02-06 ENCOUNTER — Encounter: Payer: Self-pay | Admitting: Cardiovascular Disease

## 2015-02-06 VITALS — BP 138/72 | HR 91 | Ht 69.0 in | Wt 191.0 lb

## 2015-02-06 DIAGNOSIS — E785 Hyperlipidemia, unspecified: Secondary | ICD-10-CM | POA: Diagnosis not present

## 2015-02-06 DIAGNOSIS — I2510 Atherosclerotic heart disease of native coronary artery without angina pectoris: Secondary | ICD-10-CM

## 2015-02-06 DIAGNOSIS — I10 Essential (primary) hypertension: Secondary | ICD-10-CM | POA: Diagnosis not present

## 2015-02-06 DIAGNOSIS — I429 Cardiomyopathy, unspecified: Secondary | ICD-10-CM | POA: Diagnosis not present

## 2015-02-06 DIAGNOSIS — I351 Nonrheumatic aortic (valve) insufficiency: Secondary | ICD-10-CM

## 2015-02-06 DIAGNOSIS — I5022 Chronic systolic (congestive) heart failure: Secondary | ICD-10-CM

## 2015-02-06 NOTE — Progress Notes (Signed)
Patient ID: Ricardo Zuniga, male   DOB: 04-Apr-1951, 64 y.o.   MRN: [id number]      SUBJECTIVE: The patient presents for follow-up for a non-ischemic cardiomyopathy and chronic systolic heart failure. Repeat echocardiogram in October 2015 demonstrated improvement of left ventricular systolic function with EF 45-50%, which had previously been 25-30%. There was mild LVH and grade 1 diastolic dysfunction with mild aortic regurgitation.  He is feeling well and denies chest pain, shortness of breath, and leg swelling. He can do yard work for approximately 30 minutes and then takes a break. He remains quite active around his house. His wife recently underwent knee replacement surgery.  ECG performed in the office today demonstrates normal sinus rhythm with a nonspecific T wave abnormality.   Review of Systems: As per "subjective", otherwise negative.  Allergies  Allergen Reactions  . Lisinopril Cough  . Pseudoephedrine Other (See Comments)    Delirium    Current Outpatient Prescriptions  Medication Sig Dispense Refill  . ALPRAZolam (XANAX) 0.5 MG tablet Take 0.5 mg by mouth at bedtime as needed.     Marland Kitchen aspirin 81 MG chewable tablet Chew 1 tablet (81 mg total) by mouth daily.    Marland Kitchen atorvastatin (LIPITOR) 80 MG tablet Take 1 tablet (80 mg total) by mouth daily at 6 PM. 30 tablet 3  . carvedilol (COREG) 3.125 MG tablet Take 1 tablet (3.125 mg total) by mouth 2 (two) times daily with a meal. 60 tablet 3  . fluticasone (FLONASE) 50 MCG/ACT nasal spray Place 2 sprays into both nostrils daily as needed.     . furosemide (LASIX) 20 MG tablet Take 1 tablet (20 mg total) by mouth daily. and as advised. 45 tablet 3  . INVOKANA 300 MG TABS Take 1 tablet by mouth daily. Take half tab once a day    . Loratadine 10 MG CAPS Take 1 capsule by mouth as needed.     Marland Kitchen losartan (COZAAR) 25 MG tablet Take 0.5 tablets (12.5 mg total) by mouth every evening. 15 tablet 3  . omeprazole (PRILOSEC OTC) 20 MG tablet Take  20 mg by mouth daily as needed.     No current facility-administered medications for this visit.    Past Medical History  Diagnosis Date  . Hypertension   . Anal fissure   . Ureteral colic   . Nephrolithiasis   . Gastroesophageal reflux disease   . Peptic ulcer disease     remote  . DM type 2 (diabetes mellitus, type 2)   . APML (acute promyelocytic leukemia) in remission     completed treatment 04/2013  . Heart murmur   . Difficult intubation     Past Surgical History  Procedure Laterality Date  . Anal fissure repair    . Lumbar fusion    . Cholecystectomy    . Cervical fusion    . Knee surgery      x3  . Shoulder surgery      x3  . Cataract extraction, bilateral    . Appendectomy    . Left and right heart catheterization with coronary angiogram N/A 12/12/2013    Procedure: LEFT AND RIGHT HEART CATHETERIZATION WITH CORONARY ANGIOGRAM;  Surgeon: [name], MD;  Location: [facility] CATH LAB;  Service: Cardiovascular;  Laterality: N/A;    History   Social History  . Marital Status: Married    Spouse Name: N/A  . Number of Children: N/A  . Years of Education: N/A   Occupational History  .  [employer]  . SECURITY GUARD    Social History Main Topics  . Smoking status: Former Smoker    Start date: 02/17/1968    Quit date: 02/28/1986  . Smokeless tobacco: Never Used  . Alcohol Use: No  . Drug Use: No  . Sexual Activity: Yes   Other Topics Concern  . Not on file   Social History Narrative     Filed Vitals:   02/06/15 0901  BP: 138/72  Pulse: 91  Height: 5\' 9"  (1.753 m)  Weight: 191 lb (86.637 kg)  SpO2: 97%    PHYSICAL EXAM General: NAD HEENT: Normal. Neck: No JVD, no thyromegaly. Lungs: Clear to auscultation bilaterally with normal respiratory effort. CV: Nondisplaced PMI.  Regular rate and rhythm, normal S1/S2, no S3/S4, no murmur. No pretibial or periankle edema.  No carotid bruit.  Normal pedal pulses.  Abdomen: Soft,  nontender, no distention.  Neurologic: Alert and oriented x 3.  Psych: Normal affect. Skin: Normal. Musculoskeletal: Normal range of motion, no gross deformities. Extremities: No clubbing or cyanosis.   ECG: Most recent ECG reviewed.      ASSESSMENT AND PLAN: 1. Nonischemic cardiomyopathy/chronic systolic heart failure: He is currently compensated and euvolemic. He is intolerant of ACEI's as they caused a dry cough. Continue low-dose losartan and Coreg. I have instructed him to take an extra tablet of Lasix should he experience increasing shortness of breath, leg swelling, or weight gain, and to inform me if this does occur. IgG was mildly low at 588. His cardiomyopathy may be related to a viral etiology (prior infections related to Hickman catheter) or a tachycardia mediated etiology. There has been interval improvement in LV systolic function.  2. Essential hypertension: Well controlled on current regimen. No changes.  3. CAD with 50-60% RCA lesion: Symptomatically stable. No changes to medication regimen which include ASA and statin.   4. Valvular heart disease: Stable. Will continue to monitor.   Dispo: f/u 1 year.  Kate Sable, M.D., F.A.C.C.

## 2015-02-06 NOTE — Patient Instructions (Signed)
Your physician wants you to follow-up in: 1 year with Dr. Koneswaran.  You will receive a reminder letter in the mail two months in advance. If you don't receive a letter, please call our office to schedule the follow-up appointment.  Your physician recommends that you continue on your current medications as directed. Please refer to the Current Medication list given to you today.  Thank you for choosing [facility]!   

## 2015-04-06 ENCOUNTER — Other Ambulatory Visit: Payer: Self-pay | Admitting: Cardiovascular Disease

## 2015-04-23 ENCOUNTER — Encounter (INDEPENDENT_AMBULATORY_CARE_PROVIDER_SITE_OTHER): Payer: Self-pay | Admitting: *Deleted

## 2015-04-26 ENCOUNTER — Encounter (INDEPENDENT_AMBULATORY_CARE_PROVIDER_SITE_OTHER): Payer: Self-pay | Admitting: *Deleted

## 2015-04-26 ENCOUNTER — Other Ambulatory Visit (INDEPENDENT_AMBULATORY_CARE_PROVIDER_SITE_OTHER): Payer: Self-pay | Admitting: *Deleted

## 2015-04-26 DIAGNOSIS — Z1211 Encounter for screening for malignant neoplasm of colon: Secondary | ICD-10-CM

## 2015-05-06 ENCOUNTER — Other Ambulatory Visit: Payer: Self-pay | Admitting: Cardiovascular Disease

## 2015-05-17 ENCOUNTER — Telehealth (INDEPENDENT_AMBULATORY_CARE_PROVIDER_SITE_OTHER): Payer: Self-pay | Admitting: *Deleted

## 2015-05-17 DIAGNOSIS — Z1211 Encounter for screening for malignant neoplasm of colon: Secondary | ICD-10-CM

## 2015-05-17 NOTE — Telephone Encounter (Signed)
Patient needs suprep 

## 2015-05-18 MED ORDER — SUPREP BOWEL PREP KIT 17.5-3.13-1.6 GM/177ML PO SOLN: 1.0000 | Freq: Once | ORAL | Status: DC

## 2015-06-04 ENCOUNTER — Encounter (INDEPENDENT_AMBULATORY_CARE_PROVIDER_SITE_OTHER): Payer: Self-pay

## 2015-06-04 ENCOUNTER — Telehealth (INDEPENDENT_AMBULATORY_CARE_PROVIDER_SITE_OTHER): Payer: Self-pay | Admitting: *Deleted

## 2015-06-04 NOTE — Telephone Encounter (Addendum)
Referring MD/PCP: vyas   Procedure: tcs  Reason/Indication:  Hx polyps, fam hx colon ca, hx ana fistula  Has patient had this procedure before?  Yes, 2009 -- scanned  If so, when, by whom and where?    Is there a family history of colon cancer?  Yes, grandfather  Who?  What age when diagnosed?    Is patient diabetic?   yes      Does patient have prosthetic heart valve?  no  Do you have a pacemaker?  no  Has patient ever had endocarditis? no  Has patient had joint replacement within last 12 months?  no  Does patient tend to be constipated or take laxatives? yes  Does patient have a history of alcohol/drug use? no  Is patient on Coumadin, Plavix and/or Aspirin? yes  Medications: asa 81 mg daily, furosemide 20 mg daily, invokana 300 mg daily, losartan 25 mg 1/2 tab daily, colace bid, carvedilol 3.125 mg bid, atorvastatin 80 mg daily, miralax daily  Allergies: see epic        Patient states his sphincter is extremely tight  Medication Adjustment: asa 2 days, hold invokana morning of  Procedure date & time: 06/27/15 at 730

## 2015-06-05 NOTE — Telephone Encounter (Signed)
agree

## 2015-06-19 ENCOUNTER — Other Ambulatory Visit: Payer: Self-pay | Admitting: Orthopedic Surgery

## 2015-06-19 ENCOUNTER — Other Ambulatory Visit (HOSPITAL_COMMUNITY): Payer: Self-pay | Admitting: Orthopedic Surgery

## 2015-06-19 DIAGNOSIS — M25511 Pain in right shoulder: Secondary | ICD-10-CM

## 2015-06-20 ENCOUNTER — Ambulatory Visit
Admission: RE | Admit: 2015-06-20 | Discharge: 2015-06-20 | Disposition: A | Discharge status: Home / Self Care | Payer: Medicare PPO | Source: Ambulatory Visit | Attending: Orthopedic Surgery | Admitting: Orthopedic Surgery

## 2015-06-20 ENCOUNTER — Other Ambulatory Visit: Payer: Medicare PPO

## 2015-06-20 DIAGNOSIS — M25511 Pain in right shoulder: Secondary | ICD-10-CM

## 2015-06-25 ENCOUNTER — Telehealth: Payer: Self-pay | Admitting: Cardiovascular Disease

## 2015-06-25 NOTE — Telephone Encounter (Signed)
Ricardo Zuniga walked into [facility] Friday stating that he needs a pre op clearnace for shoulder surgery. States that he is in a lot of pain.  Checking to see if Dr. Bronson Ing will be able to see patient in the [facility] this week.

## 2015-06-25 NOTE — Telephone Encounter (Signed)
Can add to my schedule this Wed at 3 pm.

## 2015-06-27 ENCOUNTER — Encounter: Payer: Self-pay | Admitting: Cardiovascular Disease

## 2015-06-27 ENCOUNTER — Ambulatory Visit (INDEPENDENT_AMBULATORY_CARE_PROVIDER_SITE_OTHER): Payer: Medicare PPO | Admitting: Cardiovascular Disease

## 2015-06-27 ENCOUNTER — Encounter (HOSPITAL_COMMUNITY): Payer: Self-pay | Admitting: *Deleted

## 2015-06-27 ENCOUNTER — Ambulatory Visit (HOSPITAL_COMMUNITY)
Admission: RE | Admit: 2015-06-27 | Discharge: 2015-06-27 | Disposition: A | Discharge status: Home / Self Care | Payer: Medicare PPO | Source: Ambulatory Visit | Attending: Internal Medicine | Admitting: Internal Medicine

## 2015-06-27 ENCOUNTER — Encounter (HOSPITAL_COMMUNITY)
Admission: RE | Disposition: A | Discharge status: Home / Self Care | Payer: Self-pay | Source: Ambulatory Visit | Attending: Internal Medicine

## 2015-06-27 VITALS — BP 122/70 | HR 96 | Ht 69.0 in | Wt 192.0 lb

## 2015-06-27 DIAGNOSIS — Z01818 Encounter for other preprocedural examination: Secondary | ICD-10-CM

## 2015-06-27 DIAGNOSIS — Z87891 Personal history of nicotine dependence: Secondary | ICD-10-CM | POA: Insufficient documentation

## 2015-06-27 DIAGNOSIS — I10 Essential (primary) hypertension: Secondary | ICD-10-CM | POA: Diagnosis not present

## 2015-06-27 DIAGNOSIS — K279 Peptic ulcer, site unspecified, unspecified as acute or chronic, without hemorrhage or perforation: Secondary | ICD-10-CM | POA: Diagnosis not present

## 2015-06-27 DIAGNOSIS — I2510 Atherosclerotic heart disease of native coronary artery without angina pectoris: Secondary | ICD-10-CM

## 2015-06-27 DIAGNOSIS — Z86010 Personal history of colonic polyps: Secondary | ICD-10-CM | POA: Diagnosis not present

## 2015-06-27 DIAGNOSIS — I429 Cardiomyopathy, unspecified: Secondary | ICD-10-CM | POA: Diagnosis not present

## 2015-06-27 DIAGNOSIS — K219 Gastro-esophageal reflux disease without esophagitis: Secondary | ICD-10-CM | POA: Insufficient documentation

## 2015-06-27 DIAGNOSIS — E785 Hyperlipidemia, unspecified: Secondary | ICD-10-CM

## 2015-06-27 DIAGNOSIS — I351 Nonrheumatic aortic (valve) insufficiency: Secondary | ICD-10-CM

## 2015-06-27 DIAGNOSIS — Z7982 Long term (current) use of aspirin: Secondary | ICD-10-CM | POA: Diagnosis not present

## 2015-06-27 DIAGNOSIS — D123 Benign neoplasm of transverse colon: Secondary | ICD-10-CM | POA: Insufficient documentation

## 2015-06-27 DIAGNOSIS — Z79899 Other long term (current) drug therapy: Secondary | ICD-10-CM | POA: Insufficient documentation

## 2015-06-27 DIAGNOSIS — Z800 Family history of malignant neoplasm of digestive organs: Secondary | ICD-10-CM | POA: Insufficient documentation

## 2015-06-27 DIAGNOSIS — K624 Stenosis of anus and rectum: Secondary | ICD-10-CM | POA: Insufficient documentation

## 2015-06-27 DIAGNOSIS — I5022 Chronic systolic (congestive) heart failure: Secondary | ICD-10-CM | POA: Diagnosis not present

## 2015-06-27 DIAGNOSIS — Z1211 Encounter for screening for malignant neoplasm of colon: Secondary | ICD-10-CM | POA: Insufficient documentation

## 2015-06-27 DIAGNOSIS — K5730 Diverticulosis of large intestine without perforation or abscess without bleeding: Secondary | ICD-10-CM | POA: Insufficient documentation

## 2015-06-27 DIAGNOSIS — E7800 Pure hypercholesterolemia, unspecified: Secondary | ICD-10-CM | POA: Insufficient documentation

## 2015-06-27 DIAGNOSIS — C9241 Acute promyelocytic leukemia, in remission: Secondary | ICD-10-CM | POA: Diagnosis not present

## 2015-06-27 DIAGNOSIS — E119 Type 2 diabetes mellitus without complications: Secondary | ICD-10-CM | POA: Diagnosis not present

## 2015-06-27 HISTORY — PX: COLONOSCOPY: SHX5424

## 2015-06-27 HISTORY — DX: Pure hypercholesterolemia, unspecified: E78.00

## 2015-06-27 LAB — GLUCOSE, CAPILLARY: Glucose-Capillary: 117 mg/dL — ABNORMAL HIGH (ref 65–99)

## 2015-06-27 SURGERY — COLONOSCOPY
Anesthesia: Moderate Sedation

## 2015-06-27 MED ORDER — STERILE WATER FOR IRRIGATION IR SOLN
Status: DC | PRN
  Administered 2015-06-27: 08:00:00

## 2015-06-27 MED ORDER — MIDAZOLAM HCL 5 MG/5ML IJ SOLN
INTRAMUSCULAR | Status: DC | PRN
  Administered 2015-06-27 (×3): 2 mg via INTRAVENOUS

## 2015-06-27 MED ORDER — SODIUM CHLORIDE 0.9 % IV SOLN
INTRAVENOUS | Status: DC
  Administered 2015-06-27: 07:00:00 via INTRAVENOUS

## 2015-06-27 MED ORDER — MEPERIDINE HCL 50 MG/ML IJ SOLN
INTRAMUSCULAR | Status: DC | PRN
  Administered 2015-06-27 (×2): 25 mg via INTRAVENOUS

## 2015-06-27 NOTE — Progress Notes (Signed)
Patient ID: Ricardo Zuniga, male   DOB: 29-May-1951, 64 y.o.   MRN: [id number]      SUBJECTIVE: The patient presents for pre-operative risk assessment prior to undergoing shoulder surgery. He has a history of a non-ischemic cardiomyopathy and chronic systolic heart failure.   Echocardiogram in October 2015 demonstrated improvement of left ventricular systolic function with EF 45-50%, which had previously been 25-30%. There was mild LVH and grade 1 diastolic dysfunction with mild aortic regurgitation.  He is feeling well and denies chest pain, shortness of breath, and leg swelling. He stays active doing yardwork.   Review of Systems: As per "subjective", otherwise negative.  Allergies  Allergen Reactions  . Lisinopril Cough  . Pseudoephedrine Other (See Comments)    Delirium    Current Outpatient Prescriptions  Medication Sig Dispense Refill  . ALPRAZolam (XANAX) 0.5 MG tablet Take 0.5 mg by mouth at bedtime as needed for anxiety or sleep.     Marland Kitchen aspirin 81 MG chewable tablet Chew 1 tablet (81 mg total) by mouth daily. (Patient not taking: Reported on 06/20/2015)    . atorvastatin (LIPITOR) 80 MG tablet TAKE ONE TABLET BY MOUTH ONCE DAILY AT 6 :00 PM 30 tablet 3  . carvedilol (COREG) 3.125 MG tablet TAKE ONE TABLET BY MOUTH TWICE DAILY WITH  A  MEAL 60 tablet 3  . fluticasone (FLONASE) 50 MCG/ACT nasal spray Place 2 sprays into both nostrils daily as needed for allergies.     . furosemide (LASIX) 20 MG tablet TAKE ONE TABLET BY MOUTH ONCE DAILY AND  AS  ADVISED 45 tablet 3  . INVOKANA 300 MG TABS Take 1 tablet by mouth daily. Take half tab once a day    . Loratadine 10 MG CAPS Take 1 capsule by mouth daily as needed (allergies).     . losartan (COZAAR) 25 MG tablet TAKE 1/2 TABLET BY MOUTH EACH EVENING 15 tablet 3  . omeprazole (PRILOSEC OTC) 20 MG tablet Take 20 mg by mouth daily as needed (acid reflux).      No current facility-administered medications for this visit.    Past  Medical History  Diagnosis Date  . Hypertension   . Anal fissure   . Ureteral colic   . Nephrolithiasis   . Gastroesophageal reflux disease   . Peptic ulcer disease     remote  . DM type 2 (diabetes mellitus, type 2) ([place])   . APML (acute promyelocytic leukemia) in remission ([place])     completed treatment 04/2013  . Heart murmur   . Difficult intubation   . Hypercholesteremia     Past Surgical History  Procedure Laterality Date  . Anal fissure repair    . Lumbar fusion    . Cholecystectomy    . Cervical fusion    . Knee surgery      x3  . Shoulder surgery      x3  . Cataract extraction, bilateral    . Appendectomy    . Left and right heart catheterization with coronary angiogram N/A 12/12/2013    Procedure: LEFT AND RIGHT HEART CATHETERIZATION WITH CORONARY ANGIOGRAM;  Surgeon: [name], MD;  Location: [facility] CATH LAB;  Service: Cardiovascular;  Laterality: N/A;    Social History   Social History  . Marital Status: Married    Spouse Name: N/A  . Number of Children: N/A  . Years of Education: N/A   Occupational History  . [employer]  .  SECURITY GUARD    Social History Main Topics  . Smoking status: Former Smoker -- 2.00 packs/day for 17 years    Types: Cigarettes    Start date: 02/17/1968    Quit date: 02/28/1986  . Smokeless tobacco: Never Used  . Alcohol Use: No  . Drug Use: No  . Sexual Activity: Yes   Other Topics Concern  . Not on file   Social History Narrative     Filed Vitals:   06/27/15 1500  BP: 122/70  Pulse: 96  Height: 5\' 9"  (1.753 m)  Weight: 192 lb (87.091 kg)  SpO2: 99%    PHYSICAL EXAM General: NAD HEENT: Normal. Neck: No JVD, no thyromegaly. Lungs: Clear to auscultation bilaterally with normal respiratory effort. CV: Nondisplaced PMI.  Regular rate and rhythm, normal S1/S2, no S3/S4, no murmur. No pretibial or periankle edema.  No carotid bruit.   Abdomen: Soft, nontender, no distention.  Neurologic:  Alert and oriented x 3.  Psych: Normal affect. Skin: Normal. Musculoskeletal: Normal range of motion, no gross deformities. Extremities: No clubbing or cyanosis.   ECG: Most recent ECG reviewed.      ASSESSMENT AND PLAN: 1. Nonischemic cardiomyopathy/chronic systolic heart failure: He is currently compensated and euvolemic. He is intolerant of ACEI's as they caused a dry cough. Continue low-dose losartan and Coreg. I previously instructed him to take an extra tablet of Lasix should he experience increasing shortness of breath, leg swelling, or weight gain, and to inform me if this does occur. His cardiomyopathy may be related to a viral etiology (prior infections related to Hickman catheter) or a tachycardia mediated etiology. There has been interval improvement in LV systolic function.  2. Essential hypertension: Well controlled on current regimen. No changes.  3. CAD with 50-60% RCA lesion: Symptomatically stable. No changes to medication regimen which include ASA, Coreg, and statin.   4. Valvular heart disease: Stable. Will continue to monitor.  5. Preoperative risk assessment: Low to intermediate risk for major adverse cardiac event in perioperative period. No noninvasive testing is indicated at this time.  Dispo: f/u 1 year.   Kate Sable, M.D., F.A.C.C.

## 2015-06-27 NOTE — Op Note (Signed)
COLONOSCOPY PROCEDURE REPORT  PATIENT:  Ricardo Zuniga  MR#:  [id number] Birthdate:  [birth year], 64 y.o., male Endoscopist:  Dr. Rogene Houston, MD Referred By:  Dr. Glenda Chroman, MD Procedure Date: 06/27/2015  Procedure:   Colonoscopy  Indications:  Patient is 64 year old Caucasian male with history of colonic adenomas and is here for surveillance colonoscopy. Last exam was in September 2009 at [facility] in [facility] with removal of 20 mm tubulovillous adenoma with focal high-grade dysplasia.  Informed Consent:  The procedure and risks were reviewed with the patient and informed consent was obtained.  Medications:  Demerol 50 mg IV Versed 6 mg IV  Description of procedure:  After a digital rectal exam was performed, that colonoscope was advanced from the anus through the rectum and colon to the area of the cecum, ileocecal valve and appendiceal orifice. The cecum was deeply intubated. These structures were well-seen and photographed for the record. From the level of the cecum and ileocecal valve, the scope was slowly and cautiously withdrawn. The mucosal surfaces were carefully surveyed utilizing scope tip to flexion to facilitate fold flattening as needed. The scope was pulled down into the rectum where a thorough exam including retroflexion was performed.  Findings:   Prep satisfactory. Small polyp ablated via cold biopsy from mid transverse colon. Few small diverticula at sigmoid colon. Normal rectal mucosa. Thickened anoderm. Anal stenosis. Pediatric colonoscope passed across anal stenosis without difficulty.   Therapeutic/Diagnostic Maneuvers Performed:  See above  Complications:  None  EBL: None  Cecal Withdrawal Time:  13 minutes  Impression:  Examination performed to cecum. Small polyp ablated via cold biopsy from mid transverse colon. Mild sigmoid colon diverticulosis. Anal stenosis.  Recommendations:  Standard instructions given. I will contact patient  with biopsy results and further recommendations.  Ricardo Zuniga U  06/27/2015 8:23 AM  CC: Dr. Glenda Chroman., MD & Dr. Rayne Du ref. provider found

## 2015-06-27 NOTE — H&P (Signed)
Ricardo Zuniga is an 64 y.o. male.   Chief Complaint: Patient is here for colonoscopy. HPI: 64 year old Caucasian male was here for surveillance colonoscopy. He has history of colonic polyps. Last exam was in September 2009 at [facility] in [facility] revealing 20 mm tubulovillous adenoma with focal high-grade dysplasia. He denies abdominal pain change in bowel habits or rectal bleeding. Family history significant for CRC and paternal grandfather who was around 79 at the time of diagnosis.  Past Medical History  Diagnosis Date  . Hypertension   . Anal fissure   . Ureteral colic   . Nephrolithiasis   . Gastroesophageal reflux disease   . Peptic ulcer disease     remote  . DM type 2 (diabetes mellitus, type 2) ([place])   . APML (acute promyelocytic leukemia) in remission ([place])     completed treatment 04/2013  . Heart murmur   . Difficult intubation   . Hypercholesteremia     Past Surgical History  Procedure Laterality Date  . Anal fissure repair    . Lumbar fusion    . Cholecystectomy    . Cervical fusion    . Knee surgery      x3  . Shoulder surgery      x3  . Cataract extraction, bilateral    . Appendectomy    . Left and right heart catheterization with coronary angiogram N/A 12/12/2013    Procedure: LEFT AND RIGHT HEART CATHETERIZATION WITH CORONARY ANGIOGRAM;  Surgeon: [name], MD;  Location: [facility] CATH LAB;  Service: Cardiovascular;  Laterality: N/A;    Family History  Problem Relation Age of Onset  . Colon cancer Other    Social History:  reports that he quit smoking about 29 years ago. His smoking use included Cigarettes. He started smoking about 47 years ago. He has a 34 pack-year smoking history. He has never used smokeless tobacco. He reports that he does not drink alcohol or use illicit drugs.  Allergies:  Allergies  Allergen Reactions  . Lisinopril Cough  . Pseudoephedrine Other (See Comments)    Delirium    Medications Prior to Admission  Medication  Sig Dispense Refill  . ALPRAZolam (XANAX) 0.5 MG tablet Take 0.5 mg by mouth at bedtime as needed for anxiety or sleep.     Marland Kitchen atorvastatin (LIPITOR) 80 MG tablet TAKE ONE TABLET BY MOUTH ONCE DAILY AT 6 :00 PM 30 tablet 3  . carvedilol (COREG) 3.125 MG tablet TAKE ONE TABLET BY MOUTH TWICE DAILY WITH  A  MEAL 60 tablet 3  . furosemide (LASIX) 20 MG tablet TAKE ONE TABLET BY MOUTH ONCE DAILY AND  AS  ADVISED 45 tablet 3  . INVOKANA 300 MG TABS Take 1 tablet by mouth daily. Take half tab once a day    . losartan (COZAAR) 25 MG tablet TAKE 1/2 TABLET BY MOUTH EACH EVENING 15 tablet 3  . omeprazole (PRILOSEC OTC) 20 MG tablet Take 20 mg by mouth daily as needed (acid reflux).     Manus Gunning BOWEL PREP SOLN Take 1 kit by mouth once. 1 Bottle 0  . aspirin 81 MG chewable tablet Chew 1 tablet (81 mg total) by mouth daily. (Patient not taking: Reported on 06/20/2015)    . fluticasone (FLONASE) 50 MCG/ACT nasal spray Place 2 sprays into both nostrils daily as needed for allergies.     . Loratadine 10 MG CAPS Take 1 capsule by mouth daily as needed (allergies).  Results for orders placed or performed during the hospital encounter of 06/27/15 (from the past 48 hour(s))  Glucose, capillary     Status: Abnormal   Collection Time: 06/27/15  7:01 AM  Result Value Ref Range   Glucose-Capillary 117 (H) 65 - 99 mg/dL   No results found.  ROS  Blood pressure 144/89, pulse 100, temperature 99.1 F (37.3 C), temperature source Oral, resp. rate 12, height $RemoveBe'5\' 9"'WyMljstyG$  (1.753 m), weight 182 lb (82.555 kg), SpO2 97 %. Physical Exam  Constitutional: He appears well-developed and well-nourished.  HENT:  Mouth/Throat: Oropharynx is clear and moist.  Eyes: Conjunctivae are normal. No scleral icterus.  Neck: No thyromegaly present.  Cardiovascular: Normal rate, regular rhythm and normal heart sounds.   No murmur heard. Respiratory: Effort normal and breath sounds normal.  GI: Soft. He exhibits no distension and no  mass. There is no tenderness.  Musculoskeletal: He exhibits no edema.  Lymphadenopathy:    He has no cervical adenopathy.  Neurological: He is alert.  Skin: Skin is warm and dry.     Assessment/Plan History of colonic polyps. Surveillance colonoscopy.   REHMAN,NAJEEB U 06/27/2015, 7:43 AM

## 2015-06-27 NOTE — Patient Instructions (Signed)
Your physician wants you to follow-up in: 1 Year with Dr. Koneswaran.  You will receive a reminder letter in the mail two months in advance. If you don't receive a letter, please call our office to schedule the follow-up appointment.  Your physician recommends that you continue on your current medications as directed. Please refer to the Current Medication list given to you today.  If you need a refill on your cardiac medications before your next appointment, please call your pharmacy.  Thank you for choosing [facility]!   

## 2015-06-27 NOTE — Discharge Instructions (Signed)
Resume usual medications and diet. No driving for 24 hours. Physician will call with biopsy results. Next colonoscopy in 5 years. Colon Polyps Polyps are lumps of extra tissue growing inside the body. Polyps can grow in the large intestine (colon). Most colon polyps are noncancerous (benign). However, some colon polyps can become cancerous over time. Polyps that are larger than a pea may be harmful. To be safe, caregivers remove and test all polyps. CAUSES  Polyps form when mutations in the genes cause your cells to grow and divide even though no more tissue is needed. RISK FACTORS There are a number of risk factors that can increase your chances of getting colon polyps. They include:  Being older than 50 years.  Family history of colon polyps or colon cancer.  Long-term colon diseases, such as colitis or Crohn disease.  Being overweight.  Smoking.  Being inactive.  Drinking too much alcohol. SYMPTOMS  Most small polyps do not cause symptoms. If symptoms are present, they may include:  Blood in the stool. The stool may look dark red or black.  Constipation or diarrhea that lasts longer than 1 week. DIAGNOSIS People often do not know they have polyps until their caregiver finds them during a regular checkup. Your caregiver can use 4 tests to check for polyps:  Digital rectal exam. The caregiver wears gloves and feels inside the rectum. This test would find polyps only in the rectum.  Barium enema. The caregiver puts a liquid called barium into your rectum before taking X-rays of your colon. Barium makes your colon look white. Polyps are dark, so they are easy to see in the X-ray pictures.  Sigmoidoscopy. A thin, flexible tube (sigmoidoscope) is placed into your rectum. The sigmoidoscope has a light and tiny camera in it. The caregiver uses the sigmoidoscope to look at the last third of your colon.  Colonoscopy. This test is like sigmoidoscopy, but the caregiver looks at the  entire colon. This is the most common method for finding and removing polyps. TREATMENT  Any polyps will be removed during a sigmoidoscopy or colonoscopy. The polyps are then tested for cancer. PREVENTION  To help lower your risk of getting more colon polyps:  Eat plenty of fruits and vegetables. Avoid eating fatty foods.  Do not smoke.  Avoid drinking alcohol.  Exercise every day.  Lose weight if recommended by your caregiver.  Eat plenty of calcium and folate. Foods that are rich in calcium include milk, cheese, and broccoli. Foods that are rich in folate include chickpeas, kidney beans, and spinach. HOME CARE INSTRUCTIONS Keep all follow-up appointments as directed by your caregiver. You may need periodic exams to check for polyps. SEEK MEDICAL CARE IF: You notice bleeding during a bowel movement.   This information is not intended to replace advice given to you by your health care provider. Make sure you discuss any questions you have with your health care provider.   Document Released: 04/23/2004 Document Revised: 08/18/2014 Document Reviewed: 10/07/2011 Elsevier Interactive Patient Education 2016 [employer] Colonoscopy, Care After Refer to this sheet in the next few weeks. These instructions provide you with information on caring for yourself after your procedure. Your health care provider may also give you more specific instructions. Your treatment has been planned according to current medical practices, but problems sometimes occur. Call your health care provider if you have any problems or questions after your procedure. WHAT TO EXPECT AFTER THE PROCEDURE  After your procedure, it is typical to have the  following:  A small amount of blood in your stool.  Moderate amounts of gas and mild abdominal cramping or bloating. HOME CARE INSTRUCTIONS  Do not drive, operate machinery, or sign important documents for 24 hours.  You may shower and resume your regular physical  activities, but move at a slower pace for the first 24 hours.  Take frequent rest periods for the first 24 hours.  Walk around or put a warm pack on your abdomen to help reduce abdominal cramping and bloating.  Drink enough fluids to keep your urine clear or pale yellow.  You may resume your normal diet as instructed by your health care provider. Avoid heavy or fried foods that are hard to digest.  Avoid drinking alcohol for 24 hours or as instructed by your health care provider.  Only take over-the-counter or prescription medicines as directed by your health care provider.  If a tissue sample (biopsy) was taken during your procedure:  Do not take aspirin or blood thinners for 7 days, or as instructed by your health care provider.  Do not drink alcohol for 7 days, or as instructed by your health care provider.  Eat soft foods for the first 24 hours. SEEK MEDICAL CARE IF: You have persistent spotting of blood in your stool 2-3 days after the procedure. SEEK IMMEDIATE MEDICAL CARE IF:  You have more than a small spotting of blood in your stool.  You pass large blood clots in your stool.  Your abdomen is swollen (distended).  You have nausea or vomiting.  You have a fever.  You have increasing abdominal pain that is not relieved with medicine.   This information is not intended to replace advice given to you by your health care provider. Make sure you discuss any questions you have with your health care provider.   Document Released: 03/11/2004 Document Revised: 05/18/2013 Document Reviewed: 04/04/2013 Elsevier Interactive Patient Education [employer].

## 2015-06-29 ENCOUNTER — Encounter (HOSPITAL_COMMUNITY): Payer: Self-pay | Admitting: Internal Medicine

## 2015-07-10 ENCOUNTER — Other Ambulatory Visit (HOSPITAL_COMMUNITY): Payer: Medicare PPO

## 2015-07-11 ENCOUNTER — Ambulatory Visit: Payer: Self-pay | Admitting: Physician Assistant

## 2015-07-11 NOTE — H&P (Signed)
Ricardo Zuniga is an 64 y.o. male.   Chief Complaint: right shoulder pain HPI: : Patient is a 64 year-old male who is known to the office.  States he was driving off his [place] and his shoulder felt like an aluminum can crunching and ripping.  This was on June 10, 2015.  Has a history of a right shoulder arthroscopy done back in May of 2008.  It was acromioplasty distal clavicle and debridement for a torn labrum at the time.  Recovered from this well.  He is left hand dominant.  History of rotator cuff tear on the left.  With his increased pain and functional deficit with his right shoulder currently.  We elected to work up with MRI showing extensive articular surface tear with a lot of pain.  Past Medical History  Diagnosis Date  . Hypertension   . Anal fissure   . Ureteral colic   . Nephrolithiasis   . Gastroesophageal reflux disease   . Peptic ulcer disease     remote  . DM type 2 (diabetes mellitus, type 2) ([place])   . APML (acute promyelocytic leukemia) in remission ([place])     completed treatment 04/2013  . Heart murmur   . Difficult intubation   . Hypercholesteremia     Past Surgical History  Procedure Laterality Date  . Anal fissure repair    . Lumbar fusion    . Cholecystectomy    . Cervical fusion    . Knee surgery      x3  . Shoulder surgery      x3  . Cataract extraction, bilateral    . Appendectomy    . Left and right heart catheterization with coronary angiogram N/A 12/12/2013    Procedure: LEFT AND RIGHT HEART CATHETERIZATION WITH CORONARY ANGIOGRAM;  Surgeon: [name], MD;  Location: [facility] CATH LAB;  Service: Cardiovascular;  Laterality: N/A;  . Colonoscopy N/A 06/27/2015    Procedure: COLONOSCOPY;  Surgeon: Rogene Houston, MD;  Location: [facility] ENDO [facility];  Service: Endoscopy;  Laterality: N/A;  730    Family History  Problem Relation Age of Onset  . Colon cancer Other    Social History:  reports that he quit smoking about 29 years ago. His smoking use  included Cigarettes. He started smoking about 47 years ago. He has a 34 pack-year smoking history. He has never used smokeless tobacco. He reports that he does not drink alcohol or use illicit drugs.  Allergies:  Allergies  Allergen Reactions  . Lisinopril Cough  . Pseudoephedrine Other (See Comments)    Delirium     (Not in a hospital admission)  No results found for this or any previous visit (from the past 48 hour(s)). No results found.  ROS  There were no vitals taken for this visit. Physical Exam  Constitutional: He is oriented to person, place, and time. He appears well-developed and well-nourished. No distress.  HENT:  Head: Normocephalic and atraumatic.  Nose: Nose normal.  Eyes: Conjunctivae and EOM are normal. Pupils are equal, round, and reactive to light.  Neck: Normal range of motion. Neck supple.  Cardiovascular: Normal rate and intact distal pulses.   Respiratory: Effort normal. No respiratory distress. He has no wheezes.  GI: Soft. He exhibits no distension.  Musculoskeletal:       Right shoulder: He exhibits decreased range of motion, tenderness, pain and decreased strength.  Lymphadenopathy:    He has no cervical adenopathy.  Neurological: He is alert and oriented  to person, place, and time.  Skin: Skin is warm. No rash noted. No erythema.  Psychiatric: He has a normal mood and affect. His behavior is normal.     Assessment/Plan Right shoulder extensive articular surface tears of rotator cuff   He has had 2-3 procedures on his shoulder with an extensive articular surface tear with a lot of pain leading to the fact he will probably need a repair with a debridement. He has already had a distal clavicle excision and synovitis. We will review his plain films to make a decision about his distal clavicle excision status.  He was noted on X-ray to have calcific density above the [facility]  joint.  We may need to re-explore this area. He has a history of leukemia of which  he is basically cancer free. This affected his heart, it sounds like he had endocarditis with an injection fraction of approximately 50%.  He needs cardiac clearance as well as medical clearance. He states in the past when we originally started working with him he had a difficult intubation and we could not do his surgery at the standard outpatient center. It is unclear to me if he would need to be done in the main operating room at [facility] versus the [facility].  He had a recurrent tear on the opposite side that I think was performed in the main operating room and then from the best as I can say, the original surgery was a debridement done in 2008. Again, it was verified he had to be done at the hospital due to difficult intubation.   Chriss Czar 07/11/2015, 10:45 AM

## 2015-07-12 ENCOUNTER — Encounter (HOSPITAL_BASED_OUTPATIENT_CLINIC_OR_DEPARTMENT_OTHER): Payer: Self-pay | Admitting: *Deleted

## 2015-07-16 ENCOUNTER — Encounter (HOSPITAL_COMMUNITY)
Admission: RE | Admit: 2015-07-16 | Discharge: 2015-07-16 | Disposition: A | Discharge status: Home / Self Care | Payer: Medicare PPO | Source: Ambulatory Visit | Attending: Orthopedic Surgery | Admitting: Orthopedic Surgery

## 2015-07-16 DIAGNOSIS — C9241 Acute promyelocytic leukemia, in remission: Secondary | ICD-10-CM | POA: Diagnosis not present

## 2015-07-16 DIAGNOSIS — E119 Type 2 diabetes mellitus without complications: Secondary | ICD-10-CM | POA: Diagnosis not present

## 2015-07-16 DIAGNOSIS — M65811 Other synovitis and tenosynovitis, right shoulder: Secondary | ICD-10-CM | POA: Diagnosis not present

## 2015-07-16 DIAGNOSIS — Z87891 Personal history of nicotine dependence: Secondary | ICD-10-CM | POA: Diagnosis not present

## 2015-07-16 DIAGNOSIS — M19011 Primary osteoarthritis, right shoulder: Secondary | ICD-10-CM | POA: Diagnosis not present

## 2015-07-16 DIAGNOSIS — M75101 Unspecified rotator cuff tear or rupture of right shoulder, not specified as traumatic: Secondary | ICD-10-CM | POA: Diagnosis not present

## 2015-07-16 DIAGNOSIS — M7541 Impingement syndrome of right shoulder: Secondary | ICD-10-CM | POA: Diagnosis not present

## 2015-07-16 DIAGNOSIS — E7800 Pure hypercholesterolemia, unspecified: Secondary | ICD-10-CM | POA: Diagnosis not present

## 2015-07-16 DIAGNOSIS — I10 Essential (primary) hypertension: Secondary | ICD-10-CM | POA: Diagnosis not present

## 2015-07-16 DIAGNOSIS — K219 Gastro-esophageal reflux disease without esophagitis: Secondary | ICD-10-CM | POA: Diagnosis not present

## 2015-07-16 LAB — BASIC METABOLIC PANEL
Anion gap: 6 (ref 5–15)
BUN: 16 mg/dL (ref 6–20)
CO2: 29 mmol/L (ref 22–32)
Calcium: 9.3 mg/dL (ref 8.9–10.3)
Chloride: 105 mmol/L (ref 101–111)
Creatinine, Ser: 0.73 mg/dL (ref 0.61–1.24)
GFR calc Af Amer: >60 mL/min (ref >60)
GFR calc non Af Amer: >60 mL/min (ref >60)
Glucose, Bld: 134 mg/dL — ABNORMAL HIGH (ref 65–99)
Potassium: 4.6 mmol/L (ref 3.5–5.1)
Sodium: 140 mmol/L (ref 135–145)

## 2015-07-18 ENCOUNTER — Ambulatory Visit (HOSPITAL_BASED_OUTPATIENT_CLINIC_OR_DEPARTMENT_OTHER): Payer: Medicare PPO | Admitting: Anesthesiology

## 2015-07-18 ENCOUNTER — Ambulatory Visit (HOSPITAL_BASED_OUTPATIENT_CLINIC_OR_DEPARTMENT_OTHER)
Admission: RE | Admit: 2015-07-18 | Discharge: 2015-07-18 | Disposition: A | Discharge status: Home / Self Care | Payer: Medicare PPO | Source: Ambulatory Visit | Attending: Orthopedic Surgery | Admitting: Orthopedic Surgery

## 2015-07-18 ENCOUNTER — Encounter (HOSPITAL_BASED_OUTPATIENT_CLINIC_OR_DEPARTMENT_OTHER)
Admission: RE | Disposition: A | Discharge status: Home / Self Care | Payer: Self-pay | Source: Ambulatory Visit | Attending: Orthopedic Surgery

## 2015-07-18 ENCOUNTER — Encounter (HOSPITAL_BASED_OUTPATIENT_CLINIC_OR_DEPARTMENT_OTHER): Payer: Self-pay | Admitting: *Deleted

## 2015-07-18 DIAGNOSIS — I10 Essential (primary) hypertension: Secondary | ICD-10-CM | POA: Diagnosis not present

## 2015-07-18 DIAGNOSIS — Z87891 Personal history of nicotine dependence: Secondary | ICD-10-CM | POA: Insufficient documentation

## 2015-07-18 DIAGNOSIS — K219 Gastro-esophageal reflux disease without esophagitis: Secondary | ICD-10-CM | POA: Diagnosis not present

## 2015-07-18 DIAGNOSIS — M19011 Primary osteoarthritis, right shoulder: Secondary | ICD-10-CM | POA: Diagnosis not present

## 2015-07-18 DIAGNOSIS — E7800 Pure hypercholesterolemia, unspecified: Secondary | ICD-10-CM | POA: Insufficient documentation

## 2015-07-18 DIAGNOSIS — M75101 Unspecified rotator cuff tear or rupture of right shoulder, not specified as traumatic: Secondary | ICD-10-CM | POA: Diagnosis not present

## 2015-07-18 DIAGNOSIS — C9241 Acute promyelocytic leukemia, in remission: Secondary | ICD-10-CM | POA: Insufficient documentation

## 2015-07-18 DIAGNOSIS — M65811 Other synovitis and tenosynovitis, right shoulder: Secondary | ICD-10-CM | POA: Insufficient documentation

## 2015-07-18 DIAGNOSIS — E119 Type 2 diabetes mellitus without complications: Secondary | ICD-10-CM | POA: Insufficient documentation

## 2015-07-18 DIAGNOSIS — M7541 Impingement syndrome of right shoulder: Secondary | ICD-10-CM | POA: Insufficient documentation

## 2015-07-18 HISTORY — PX: SHOULDER ACROMIOPLASTY: SHX6093

## 2015-07-18 HISTORY — PX: SHOULDER ARTHROSCOPY: SHX128

## 2015-07-18 LAB — GLUCOSE, CAPILLARY: Glucose-Capillary: 146 mg/dL — ABNORMAL HIGH (ref 65–99)

## 2015-07-18 SURGERY — ARTHROSCOPY, SHOULDER
Anesthesia: General | Site: Shoulder | Laterality: Right

## 2015-07-18 MED ORDER — PROPOFOL 10 MG/ML IV BOLUS
INTRAVENOUS | Status: AC
  Filled 2015-07-18: qty 20

## 2015-07-18 MED ORDER — SODIUM CHLORIDE 0.9 % IR SOLN
Status: DC | PRN
  Administered 2015-07-18: 6000 mL

## 2015-07-18 MED ORDER — METHYLPREDNISOLONE ACETATE 80 MG/ML IJ SUSP
INTRAMUSCULAR | Status: DC | PRN
  Administered 2015-07-18: 80 mg via INTRA_ARTICULAR

## 2015-07-18 MED ORDER — DIAZEPAM 2 MG PO TABS: 2.0000 mg | ORAL_TABLET | Freq: Four times a day (QID) | ORAL | Status: DC | PRN

## 2015-07-18 MED ORDER — HYDROMORPHONE HCL 1 MG/ML IJ SOLN: 0.5000 mg | INTRAMUSCULAR | Status: DC | PRN

## 2015-07-18 MED ORDER — PHENYLEPHRINE HCL 10 MG/ML IJ SOLN
10.0000 mg | INTRAMUSCULAR | Status: DC | PRN
  Administered 2015-07-18: 40 ug/min via INTRAVENOUS

## 2015-07-18 MED ORDER — LACTATED RINGERS IV SOLN
INTRAVENOUS | Status: DC
Start: 2015-07-18 — End: 2015-07-18
  Administered 2015-07-18 (×2): via INTRAVENOUS

## 2015-07-18 MED ORDER — FENTANYL CITRATE (PF) 100 MCG/2ML IJ SOLN
INTRAMUSCULAR | Status: AC
  Filled 2015-07-18: qty 2

## 2015-07-18 MED ORDER — BUPIVACAINE HCL (PF) 0.25 % IJ SOLN
INTRAMUSCULAR | Status: AC
  Filled 2015-07-18: qty 30

## 2015-07-18 MED ORDER — ONDANSETRON HCL 4 MG/2ML IJ SOLN: 4.0000 mg | Freq: Once | INTRAMUSCULAR | Status: DC | PRN

## 2015-07-18 MED ORDER — FENTANYL CITRATE (PF) 100 MCG/2ML IJ SOLN
50.0000 ug | INTRAMUSCULAR | Status: DC | PRN
  Administered 2015-07-18: 100 ug via INTRAVENOUS
  Administered 2015-07-18: 50 ug via INTRAVENOUS

## 2015-07-18 MED ORDER — CHLORHEXIDINE GLUCONATE 4 % EX LIQD: 60.0000 mL | Freq: Once | CUTANEOUS | Status: DC

## 2015-07-18 MED ORDER — METOCLOPRAMIDE HCL 5 MG/ML IJ SOLN: 5.0000 mg | Freq: Three times a day (TID) | INTRAMUSCULAR | Status: DC | PRN

## 2015-07-18 MED ORDER — DEXAMETHASONE SODIUM PHOSPHATE 4 MG/ML IJ SOLN
INTRAMUSCULAR | Status: DC | PRN
  Administered 2015-07-18: 10 mg via INTRAVENOUS

## 2015-07-18 MED ORDER — MIDAZOLAM HCL 2 MG/2ML IJ SOLN
1.0000 mg | INTRAMUSCULAR | Status: DC | PRN
  Administered 2015-07-18: 2 mg via INTRAVENOUS
  Administered 2015-07-18: 1 mg via INTRAVENOUS

## 2015-07-18 MED ORDER — DOCUSATE SODIUM 100 MG PO CAPS: 100.0000 mg | ORAL_CAPSULE | Freq: Two times a day (BID) | ORAL | Status: DC

## 2015-07-18 MED ORDER — CEFAZOLIN SODIUM-DEXTROSE 2-3 GM-% IV SOLR
2.0000 g | INTRAVENOUS | Status: AC
  Administered 2015-07-18: 2 g via INTRAVENOUS

## 2015-07-18 MED ORDER — BUPIVACAINE HCL (PF) 0.25 % IJ SOLN
INTRAMUSCULAR | Status: DC | PRN
  Administered 2015-07-18: 6 mL

## 2015-07-18 MED ORDER — ONDANSETRON HCL 4 MG PO TABS: 4.0000 mg | ORAL_TABLET | Freq: Four times a day (QID) | ORAL | Status: DC | PRN

## 2015-07-18 MED ORDER — GLYCOPYRROLATE 0.2 MG/ML IJ SOLN: 0.2000 mg | Freq: Once | INTRAMUSCULAR | Status: DC | PRN

## 2015-07-18 MED ORDER — OXYCODONE-ACETAMINOPHEN 5-325 MG PO TABS: 1.0000 | ORAL_TABLET | ORAL | Status: DC | PRN

## 2015-07-18 MED ORDER — SCOPOLAMINE 1 MG/3DAYS TD PT72: 1.0000 | MEDICATED_PATCH | Freq: Once | TRANSDERMAL | Status: DC | PRN

## 2015-07-18 MED ORDER — OXYCODONE HCL 5 MG/5ML PO SOLN: 5.0000 mg | Freq: Once | ORAL | Status: DC | PRN

## 2015-07-18 MED ORDER — SODIUM CHLORIDE 0.9 % IV SOLN: INTRAVENOUS | Status: DC

## 2015-07-18 MED ORDER — SUCCINYLCHOLINE CHLORIDE 20 MG/ML IJ SOLN
INTRAMUSCULAR | Status: DC | PRN
  Administered 2015-07-18: 100 mg via INTRAVENOUS

## 2015-07-18 MED ORDER — MIDAZOLAM HCL 2 MG/2ML IJ SOLN
INTRAMUSCULAR | Status: AC
Start: 2015-07-18 — End: 2015-07-18
  Filled 2015-07-18: qty 2

## 2015-07-18 MED ORDER — MIDAZOLAM HCL 2 MG/2ML IJ SOLN
INTRAMUSCULAR | Status: AC
  Filled 2015-07-18: qty 2

## 2015-07-18 MED ORDER — ONDANSETRON HCL 4 MG/2ML IJ SOLN: 4.0000 mg | Freq: Four times a day (QID) | INTRAMUSCULAR | Status: DC | PRN

## 2015-07-18 MED ORDER — PHENYLEPHRINE HCL 10 MG/ML IJ SOLN
INTRAMUSCULAR | Status: DC | PRN
  Administered 2015-07-18 (×2): 40 ug via INTRAVENOUS

## 2015-07-18 MED ORDER — METHYLPREDNISOLONE ACETATE 80 MG/ML IJ SUSP
INTRAMUSCULAR | Status: AC
  Filled 2015-07-18: qty 1

## 2015-07-18 MED ORDER — OXYCODONE HCL 5 MG PO TABS: 5.0000 mg | ORAL_TABLET | Freq: Once | ORAL | Status: DC | PRN

## 2015-07-18 MED ORDER — FENTANYL CITRATE (PF) 100 MCG/2ML IJ SOLN: 25.0000 ug | INTRAMUSCULAR | Status: DC | PRN

## 2015-07-18 MED ORDER — METOCLOPRAMIDE HCL 5 MG PO TABS: 5.0000 mg | ORAL_TABLET | Freq: Three times a day (TID) | ORAL | Status: DC | PRN

## 2015-07-18 MED ORDER — LIDOCAINE HCL (CARDIAC) 20 MG/ML IV SOLN
INTRAVENOUS | Status: DC | PRN
  Administered 2015-07-18: 50 mg via INTRAVENOUS

## 2015-07-18 MED ORDER — CEFAZOLIN SODIUM-DEXTROSE 2-3 GM-% IV SOLR
INTRAVENOUS | Status: AC
  Filled 2015-07-18: qty 50

## 2015-07-18 MED ORDER — PROPOFOL 10 MG/ML IV BOLUS
INTRAVENOUS | Status: DC | PRN
  Administered 2015-07-18: 200 mg via INTRAVENOUS

## 2015-07-18 SURGICAL SUPPLY — 78 items
BENZOIN TINCTURE PRP APPL 2/3 (GAUZE/BANDAGES/DRESSINGS) IMPLANT
BLADE 4.2CUDA (BLADE) ×3 IMPLANT
BLADE AVERAGE 25X9 (BLADE) IMPLANT
BLADE CUTTER GATOR 3.5 (BLADE) IMPLANT
BLADE SURG 15 STRL LF DISP TIS (BLADE) IMPLANT
BLADE SURG 15 STRL SS (BLADE)
BLADE VORTEX 6.0 (BLADE) IMPLANT
BUR 3.5 LG SPHERICAL (BURR) IMPLANT
BUR EGG 3PK/BX (BURR) IMPLANT
BUR OVAL 4.0 (BURR) IMPLANT
BUR OVAL 6.0 (BURR) ×3 IMPLANT
BUR VERTEX HOODED 4.5 (BURR) IMPLANT
BURR 3.5 LG SPHERICAL (BURR)
CANNULA SHOULDER 7CM (CANNULA) ×3 IMPLANT
CANNULA TWIST IN 8.25X7CM (CANNULA) IMPLANT
CLEANER CAUTERY TIP 5X5 PAD (MISCELLANEOUS) IMPLANT
CUTTER MENISCUS  4.2MM (BLADE)
CUTTER MENISCUS 4.2MM (BLADE) IMPLANT
DECANTER SPIKE VIAL GLASS SM (MISCELLANEOUS) IMPLANT
DRAPE STERI 35X30 U-POUCH (DRAPES) ×3 IMPLANT
DRAPE SURG 17X23 STRL (DRAPES) ×3 IMPLANT
DRAPE U-SHAPE 76X120 STRL (DRAPES) ×6 IMPLANT
DRSG EMULSION OIL 3X3 NADH (GAUZE/BANDAGES/DRESSINGS) ×3 IMPLANT
DRSG PAD ABDOMINAL 8X10 ST (GAUZE/BANDAGES/DRESSINGS) ×3 IMPLANT
DURAPREP 26ML APPLICATOR (WOUND CARE) ×3 IMPLANT
ELECT REM PT RETURN 9FT ADLT (ELECTROSURGICAL) ×3
ELECTRODE REM PT RTRN 9FT ADLT (ELECTROSURGICAL) ×2 IMPLANT
GAUZE SPONGE 4X4 12PLY STRL (GAUZE/BANDAGES/DRESSINGS) ×3 IMPLANT
GLOVE BIO SURGEON STRL SZ7.5 (GLOVE) ×3 IMPLANT
GLOVE BIOGEL PI IND STRL 7.0 (GLOVE) ×2 IMPLANT
GLOVE BIOGEL PI IND STRL 8 (GLOVE) ×4 IMPLANT
GLOVE BIOGEL PI INDICATOR 7.0 (GLOVE) ×1
GLOVE BIOGEL PI INDICATOR 8 (GLOVE) ×2
GLOVE ECLIPSE 6.5 STRL STRAW (GLOVE) ×3 IMPLANT
GLOVE EXAM NITRILE MD LF STRL (GLOVE) ×3 IMPLANT
GLOVE SURG ORTHO 8.0 STRL STRW (GLOVE) ×3 IMPLANT
GOWN STRL REUS W/ TWL LRG LVL3 (GOWN DISPOSABLE) ×2 IMPLANT
GOWN STRL REUS W/ TWL XL LVL3 (GOWN DISPOSABLE) ×2 IMPLANT
GOWN STRL REUS W/TWL LRG LVL3 (GOWN DISPOSABLE) ×1
GOWN STRL REUS W/TWL XL LVL3 (GOWN DISPOSABLE) ×4 IMPLANT
MANIFOLD NEPTUNE II (INSTRUMENTS) ×3 IMPLANT
NEEDLE 1/2 CIR CATGUT .05X1.09 (NEEDLE) IMPLANT
NEEDLE SCORPION MULTI FIRE (NEEDLE) IMPLANT
NS IRRIG 1000ML POUR BTL (IV SOLUTION) ×3 IMPLANT
PACK ARTHROSCOPY DSU (CUSTOM PROCEDURE TRAY) ×3 IMPLANT
PACK BASIN DAY SURGERY FS (CUSTOM PROCEDURE TRAY) ×3 IMPLANT
PAD CLEANER CAUTERY TIP 5X5 (MISCELLANEOUS)
PAD ORTHO SHOULDER 7X19 LRG (SOFTGOODS) IMPLANT
PENCIL BUTTON HOLSTER BLD 10FT (ELECTRODE) ×3 IMPLANT
SET ARTHROSCOPY TUBING (MISCELLANEOUS) ×1
SET ARTHROSCOPY TUBING LN (MISCELLANEOUS) ×2 IMPLANT
SLING ARM FOAM STRAP LRG (SOFTGOODS) ×3 IMPLANT
SLING ARM MED ADULT FOAM STRAP (SOFTGOODS) IMPLANT
SLING ULTRA II MEDIUM (SOFTGOODS) IMPLANT
SLING ULTRA II SMALL (SOFTGOODS) IMPLANT
SPONGE LAP 4X18 X RAY DECT (DISPOSABLE) ×3 IMPLANT
STAPLER VISISTAT 35W (STAPLE) IMPLANT
STRIP CLOSURE SKIN 1/2X4 (GAUZE/BANDAGES/DRESSINGS) IMPLANT
SUCTION FRAZIER TIP 10 FR DISP (SUCTIONS) IMPLANT
SUT BONE WAX W31G (SUTURE) IMPLANT
SUT ETHILON 3 0 PS 1 (SUTURE) ×3 IMPLANT
SUT FIBERWIRE #2 38 T-5 BLUE (SUTURE)
SUT MNCRL AB 3-0 PS2 18 (SUTURE) IMPLANT
SUT TICRON 1 T 12 (SUTURE) IMPLANT
SUT TIGER TAPE 7 IN WHITE (SUTURE) IMPLANT
SUT VIC AB 0 CT1 27 (SUTURE)
SUT VIC AB 0 CT1 27XBRD ANBCTR (SUTURE) IMPLANT
SUT VIC AB 1 CT1 27 (SUTURE)
SUT VIC AB 1 CT1 27XBRD ANBCTR (SUTURE) IMPLANT
SUT VIC AB 2-0 SH 27 (SUTURE)
SUT VIC AB 2-0 SH 27XBRD (SUTURE) IMPLANT
SUTURE FIBERWR #2 38 T-5 BLUE (SUTURE) IMPLANT
TAPE FIBER 2MM 7IN #2 BLUE (SUTURE) IMPLANT
TOWEL OR 17X24 6PK STRL BLUE (TOWEL DISPOSABLE) ×3 IMPLANT
TOWEL OR NON WOVEN STRL DISP B (DISPOSABLE) IMPLANT
WAND STAR VAC 90 (SURGICAL WAND) ×3 IMPLANT
WATER STERILE IRR 1000ML POUR (IV SOLUTION) ×3 IMPLANT
YANKAUER SUCT BULB TIP NO VENT (SUCTIONS) IMPLANT

## 2015-07-18 NOTE — Anesthesia Procedure Notes (Addendum)
Anesthesia Regional Block:  Interscalene brachial plexus block  Pre-Anesthetic Checklist: ,, timeout performed, Correct Patient, Correct Site, Correct Laterality, Correct Procedure, Correct Position, site marked, Risks and benefits discussed,  Surgical consent,  Pre-op evaluation,  At surgeon's request and post-op pain management  Laterality: Right  Prep: chloraprep       Needles:  Injection technique: Single-shot  Needle Type: Echogenic Stimulator Needle     Needle Length: 9cm 9 cm Needle Gauge: 21 and 21 G    Additional Needles:  Procedures: ultrasound guided (picture in chart) Interscalene brachial plexus block Narrative:  Start time: 07/18/2015 11:40 AM End time: 07/18/2015 11:43 AM Injection made incrementally with aspirations every 5 mL.  Performed by: Personally   Additional Notes: 30 cc 0.5% Bupivacaine injected easily   Procedure Name: Intubation Date/Time: 07/18/2015 1:09 PM Performed by: Melynda Ripple D Pre-anesthesia Checklist: Patient identified, Emergency Drugs available, Suction available and Patient being monitored Patient Re-evaluated:Patient Re-evaluated prior to inductionOxygen Delivery Method: Circle System Utilized Preoxygenation: Pre-oxygenation with 100% oxygen Intubation Type: IV induction Ventilation: Mask ventilation without difficulty and Oral airway inserted - appropriate to patient size Laryngoscope Size: Glidescope and 4 Grade View: Grade II Tube type: Oral Tube size: 7.0 mm Number of attempts: 1 Airway Equipment and Method: Stylet,  Oral airway and Video-laryngoscopy Placement Confirmation: ETT inserted through vocal cords under direct vision,  positive ETCO2 and breath sounds checked- equal and bilateral Secured at: 24 cm Tube secured with: Tape Dental Injury: Teeth and Oropharynx as per pre-operative assessment  Difficulty Due To: Difficulty was anticipated and Difficult Airway- due to anterior larynx

## 2015-07-18 NOTE — Transfer of Care (Signed)
Immediate Anesthesia Transfer of Care Note  Patient: Ricardo Zuniga  Procedure(s) Performed: Procedure(s): RIGHT SHOULDER ARTHROSCOPY WITH OPEN ROTATOR CUFF REPAIR WITH DEBRIDEMENT (Right)  Patient Location: PACU  Anesthesia Type:GA combined with regional for post-op pain  Level of Consciousness: awake, alert  and oriented  Airway & Oxygen Therapy: Patient Spontanous Breathing and Patient connected to face mask oxygen  Post-op Assessment: Report given to RN and Post -op Vital signs reviewed and stable  Post vital signs: Reviewed and stable  Last Vitals:  Filed Vitals:   07/18/15 1142 07/18/15 1145  BP: 129/84 140/79  Pulse: 76 76  Temp:    Resp: 17 16    Complications: No apparent anesthesia complications

## 2015-07-18 NOTE — Anesthesia Postprocedure Evaluation (Signed)
Anesthesia Post Note  Patient: Ricardo Zuniga  Procedure(s) Performed: Procedure(s) (LRB): Right shoulder arthroscopy with debridement (Right) SHOULDER ACROMIOPLASTY (Right)  Patient location during evaluation: PACU Anesthesia Type: MAC Level of consciousness: awake and alert Pain management: pain level controlled Vital Signs Assessment: post-procedure vital signs reviewed and stable Respiratory status: spontaneous breathing Cardiovascular status: stable Anesthetic complications: no    Last Vitals:  Filed Vitals:   07/18/15 1142 07/18/15 1145  BP: 129/84 140/79  Pulse: 76 76  Temp:    Resp: 17 16    Last Pain: There were no vitals filed for this visit.               Nolon Nations

## 2015-07-18 NOTE — Brief Op Note (Signed)
07/18/2015  2:03 PM  PATIENT:  Ricardo Zuniga  64 y.o. male  PRE-OPERATIVE DIAGNOSIS:  RIGHT SHOULDER COMPLETE ROTATOR CUFF TEAR OR RUPTURE OF RIGHT SHOULDER OSTEOARTHRITIS  POST-OPERATIVE DIAGNOSIS:  Right shoulder rotator cuff tendinopathy without full thickness tear, glenohumeral degenerative joint disease  PROCEDURE:  Right shoulder arthroscopy debridement acromioplasty  SURGEON:  Surgeon(s) and Role:    * Earlie Server, MD - Primary  PHYSICIAN ASSISTANT: Vineet Kinney, PA-C  ASSISTANTS:    ANESTHESIA:   local, regional and general  EBL:  Total I/O In: 1000 [I.V.:1000] Out: -   BLOOD ADMINISTERED:none  DRAINS: none   LOCAL MEDICATIONS USED:  MARCAINE     SPECIMEN:  No Specimen  DISPOSITION OF SPECIMEN:  N/A  COUNTS:  YES  TOURNIQUET:  * No tourniquets in log *  DICTATION: .Other Dictation: Dictation Number unknown  PLAN OF CARE: Discharge to home after PACU  PATIENT DISPOSITION:  PACU - hemodynamically stable.   Delay start of Pharmacological VTE agent (>24hrs) due to surgical blood loss or risk of bleeding: not applicable

## 2015-07-18 NOTE — Anesthesia Preprocedure Evaluation (Signed)
Anesthesia Evaluation  Patient identified by MRN, date of birth, ID band Patient awake    Reviewed: Allergy & Precautions, NPO status , Patient's Chart, lab work & pertinent test results  Airway Mallampati: II  TM Distance: >3 FB Neck ROM: Full    Dental  (+) Teeth Intact, Dental Advisory Given   Pulmonary former smoker,    breath sounds clear to auscultation       Cardiovascular  Rhythm:Regular Rate:Normal     Neuro/Psych    GI/Hepatic   Endo/Other  diabetes  Renal/GU      Musculoskeletal   Abdominal   Peds  Hematology   Anesthesia Other Findings   Reproductive/Obstetrics                             Anesthesia Physical Anesthesia Plan  ASA: III  Anesthesia Plan: General   Post-op Pain Management: MAC Combined w/ Regional for Post-op pain   Induction: Intravenous  Airway Management Planned: Oral ETT  Additional Equipment:   Intra-op Plan:   Post-operative Plan: Extubation in OR  Informed Consent: I have reviewed the patients History and Physical, chart, labs and discussed the procedure including the risks, benefits and alternatives for the proposed anesthesia with the patient or authorized representative who has indicated his/her understanding and acceptance.   Dental advisory given  Plan Discussed with: CRNA and Anesthesiologist  Anesthesia Plan Comments: (R. Rotator cuff tear and impingement H/O Acute Promyelocytic leukemia S/P treatment at [facility], now in remission H/O nonischemic cardiomyopathy last EF 45-50% Type 2 DM glucose 146 H/O difficult intubation with surgery at [facility], 2008 later had successful surgery at [facility] without difficulty  Plan GA with interscalene block and glide scope intubation  Roberts Gaudy  )        Anesthesia Quick Evaluation

## 2015-07-18 NOTE — Progress Notes (Signed)
Assisted Dr. Joslin with right, ultrasound guided, interscalene  block. Side rails up, monitors on throughout procedure. See vital signs in flow sheet. Tolerated Procedure well. 

## 2015-07-18 NOTE — Discharge Instructions (Signed)
Diet: As you were doing prior to hospitalization   Activity: Increase activity slowly as tolerated  No lifting or driving for 2 weeks   Shower: May shower without a dressing on post op day #3, NO SOAKING in tub   Dressing: You may change your dressing on post op day #3.  Then change the dressing daily with sterile 4"x4"s gauze dressing  Or band aids  Weight Bearing/Sling:  May remove sling in 3 days and move shoulder as tolerated  To prevent constipation: you may use a stool softener such as -  Colace ( over the counter) 100 mg by mouth twice a day  Drink plenty of fluids ( prune juice may be helpful) and high fiber foods  Miralax ( over the counter) for constipation as needed.   Precautions: If you experience chest pain or shortness of breath - call 911 immediately For transfer to the hospital emergency department!!  If you develop a fever greater that 101 F, purulent drainage from wound, increased redness or drainage from wound, or calf pain -- Call the office   Follow- Up Appointment: Please call for an appointment to be seen in  7-10 days or as previously scheduled. [place] - [phone]   Post Anesthesia Home Care Instructions  Activity: Get plenty of rest for the remainder of the day. A responsible adult should stay with you for 24 hours following the procedure.  For the next 24 hours, DO NOT: -Drive a car -Paediatric nurse -Drink alcoholic beverages -Take any medication unless instructed by your physician -Make any legal decisions or sign important papers.  Meals: Start with liquid foods such as gelatin or soup. Progress to regular foods as tolerated. Avoid greasy, spicy, heavy foods. If nausea and/or vomiting occur, drink only clear liquids until the nausea and/or vomiting subsides. Call your physician if vomiting continues.  Special Instructions/Symptoms: Your throat may feel dry or sore from the anesthesia or the breathing tube placed in your throat during  surgery. If this causes discomfort, gargle with warm salt water. The discomfort should disappear within 24 hours.  If you had a scopolamine patch placed behind your ear for the management of post- operative nausea and/or vomiting:  1. The medication in the patch is effective for 72 hours, after which it should be removed.  Wrap patch in a tissue and discard in the trash. Wash hands thoroughly with soap and water. 2. You may remove the patch earlier than 72 hours if you experience unpleasant side effects which may include dry mouth, dizziness or visual disturbances. 3. Avoid touching the patch. Wash your hands with soap and water after contact with the patch.   Regional Anesthesia Blocks  1. Numbness or the inability to move the "blocked" extremity may last from 3-48 hours after placement. The length of time depends on the medication injected and your individual response to the medication. If the numbness is not going away after 48 hours, call your surgeon.  2. The extremity that is blocked will need to be protected until the numbness is gone and the  Strength has returned. Because you cannot feel it, you will need to take extra care to avoid injury. Because it may be weak, you may have difficulty moving it or using it. You may not know what position it is in without looking at it while the block is in effect.  3. For blocks in the legs and feet, returning to weight bearing and walking needs to be done carefully. You will need  to wait until the numbness is entirely gone and the strength has returned. You should be able to move your leg and foot normally before you try and bear weight or walk. You will need someone to be with you when you first try to ensure you do not fall and possibly risk injury.  4. Bruising and tenderness at the needle site are common side effects and will resolve in a few days.  5. Persistent numbness or new problems with movement should be communicated to the surgeon or the  [facility] [phone] [place] [phone]).

## 2015-07-18 NOTE — H&P (View-Only) (Signed)
Ricardo Zuniga is an 64 y.o. male.   Chief Complaint: right shoulder pain HPI: : Patient is a 64 year-old male who is known to the office.  States he was driving off his [place] and his shoulder felt like an aluminum can crunching and ripping.  This was on June 10, 2015.  Has a history of a right shoulder arthroscopy done back in May of 2008.  It was acromioplasty distal clavicle and debridement for a torn labrum at the time.  Recovered from this well.  He is left hand dominant.  History of rotator cuff tear on the left.  With his increased pain and functional deficit with his right shoulder currently.  We elected to work up with MRI showing extensive articular surface tear with a lot of pain.  Past Medical History  Diagnosis Date  . Hypertension   . Anal fissure   . Ureteral colic   . Nephrolithiasis   . Gastroesophageal reflux disease   . Peptic ulcer disease     remote  . DM type 2 (diabetes mellitus, type 2) ([place])   . APML (acute promyelocytic leukemia) in remission ([place])     completed treatment 04/2013  . Heart murmur   . Difficult intubation   . Hypercholesteremia     Past Surgical History  Procedure Laterality Date  . Anal fissure repair    . Lumbar fusion    . Cholecystectomy    . Cervical fusion    . Knee surgery      x3  . Shoulder surgery      x3  . Cataract extraction, bilateral    . Appendectomy    . Left and right heart catheterization with coronary angiogram N/A 12/12/2013    Procedure: LEFT AND RIGHT HEART CATHETERIZATION WITH CORONARY ANGIOGRAM;  Surgeon: [name], MD;  Location: [facility] CATH LAB;  Service: Cardiovascular;  Laterality: N/A;  . Colonoscopy N/A 06/27/2015    Procedure: COLONOSCOPY;  Surgeon: Rogene Houston, MD;  [facility];  Service: Endoscopy;  Laterality: N/A;  730    Family History  Problem Relation Age of Onset  . Colon cancer Other    Social History:  reports that he quit smoking about 29 years ago. His smoking use  included Cigarettes. He started smoking about 47 years ago. He has a 34 pack-year smoking history. He has never used smokeless tobacco. He reports that he does not drink alcohol or use illicit drugs.  Allergies:  Allergies  Allergen Reactions  . Lisinopril Cough  . Pseudoephedrine Other (See Comments)    Delirium     (Not in a hospital admission)  No results found for this or any previous visit (from the past 48 hour(s)). No results found.  ROS  There were no vitals taken for this visit. Physical Exam  Constitutional: He is oriented to person, place, and time. He appears well-developed and well-nourished. No distress.  HENT:  Head: Normocephalic and atraumatic.  Nose: Nose normal.  Eyes: Conjunctivae and EOM are normal. Pupils are equal, round, and reactive to light.  Neck: Normal range of motion. Neck supple.  Cardiovascular: Normal rate and intact distal pulses.   Respiratory: Effort normal. No respiratory distress. He has no wheezes.  GI: Soft. He exhibits no distension.  Musculoskeletal:       Right shoulder: He exhibits decreased range of motion, tenderness, pain and decreased strength.  Lymphadenopathy:    He has no cervical adenopathy.  Neurological: He is alert and oriented  to person, place, and time.  Skin: Skin is warm. No rash noted. No erythema.  Psychiatric: He has a normal mood and affect. His behavior is normal.     Assessment/Plan Right shoulder extensive articular surface tears of rotator cuff   He has had 2-3 procedures on his shoulder with an extensive articular surface tear with a lot of pain leading to the fact he will probably need a repair with a debridement. He has already had a distal clavicle excision and synovitis. We will review his plain films to make a decision about his distal clavicle excision status.  He was noted on X-ray to have calcific density above the [facility]  joint.  We may need to re-explore this area. He has a history of leukemia of which  he is basically cancer free. This affected his heart, it sounds like he had endocarditis with an injection fraction of approximately 50%.  He needs cardiac clearance as well as medical clearance. He states in the past when we originally started working with him he had a difficult intubation and we could not do his surgery at the standard outpatient center. It is unclear to me if he would need to be done in the main operating room at [facility] versus the [place].  He had a recurrent tear on the opposite side that I think was performed in the main operating room and then from the best as I can say, the original surgery was a debridement done in 2008. Again, it was verified he had to be done at the hospital due to difficult intubation.   Chriss Czar 07/11/2015, 10:45 AM

## 2015-07-18 NOTE — Interval H&P Note (Signed)
History and Physical Interval Note:  07/18/2015 12:46 PM  Ricardo Zuniga  has presented today for surgery, with the diagnosis of RIGHT SHOULDER COMPLETE ROTATOR CUFF TEAR OR RUPTURE OF RIGHT SHOULDER OSTEOARTHRITIS  The various methods of treatment have been discussed with the patient and family. After consideration of risks, benefits and other options for treatment, the patient has consented to  Procedure(s): RIGHT SHOULDER ARTHROSCOPY WITH OPEN ROTATOR CUFF REPAIR WITH DEBRIDEMENT (Right) as a surgical intervention .  The patient's history has been reviewed, patient examined, no change in status, stable for surgery.  I have reviewed the patient's chart and labs.  Questions were answered to the patient's satisfaction.     Theodore Rahrig JR,W D

## 2015-07-19 ENCOUNTER — Encounter (HOSPITAL_BASED_OUTPATIENT_CLINIC_OR_DEPARTMENT_OTHER): Payer: Self-pay | Admitting: Orthopedic Surgery

## 2015-07-19 NOTE — Op Note (Signed)
NAME:  Ricardo Zuniga, Ricardo Zuniga NO.:  [id number]  MEDICAL RECORD NO.:  [id number]  LOCATION:                               FACILITY:  [facility]  PHYSICIAN:  Lockie Pares, M.D.    DATE OF BIRTH:  07-31-1951  DATE OF PROCEDURE:  07/18/2015 DATE OF DISCHARGE:  07/18/2015                              OPERATIVE REPORT   INDICATIONS:  A 63 year old with intractable right shoulder pain. Fifteen to twenty years ago, I had done a shoulder arthroscopy in this patient and a decompression with distal clavicle.  He had done well for a good while, presented to the office in the last 6 months with progressive pain with some signs of rotator cuff pathology and osteoarthritis, had an injection probably I think more than once, leading to an MRI __________ response of injection.  The MRI showed diffuse rotator cuff tendinopathy with a rupture of long head of the biceps with some residual tendon intra-articularly, extensive synovitis, and glenohumeral degenerative change.  It did not look significant enough to consider total shoulder, so we advised him an arthroscopy, possible rotator cuff repair.  Debridement was indicated as an outpatient.  It was noted to be done in [facility] due to a previous history at an outpatient facility of difficult intubation, which was documented and already recorded in the [facility] Anesthesiology Record.  PREOPERATIVE DIAGNOSES: 1. Diffuse rotator cuff tendinopathy. 2. Residual biceps tendon remnant intra-articularly. 3. Synovitis with glenohumeral degenerative change. 4. Partial thickness rotator cuff tear. 5. Residual impingement.  POSTOPERATIVE DIAGNOSES: 1. Diffuse rotator cuff tendinopathy. 2. Residual biceps tendon remnant intra-articularly. 3. Synovitis with glenohumeral degenerative change. 4. Partial thickness rotator cuff tear. 5. Residual impingement.  OPERATION: 1. Arthroscopic debridement intra-articularly and subacromially     (extensive). 2.  Arthroscopic revision acromioplasty, all for the right shoulder.  SURGEON:  Lockie Pares, M.D.  ASSISTANT:  Chriss Czar, PA-C.  ANESTHESIA:  General with a nerve block.  DESCRIPTION OF PROCEDURE:  Relatively normal passive motion.  No instability noted.  Used arthroscope to posterolateral and anterior portal.  Systematic inspection of the glenohumeral joint showed diffuse synovial synovitis reactive.  There was a lot of tendinopathy, and there was actually a large residual stump of his biceps.  Interestingly, it was not more attached to the glenoid, but it was more in the anterior aspect of the shoulder where the tendon had only partially retracted once it was torn, and this was debrided.  Articular cartilage on the glenoid showed some punctate changes of the glenohumeral arthritis. There was more geographic way of about the posterior one-half of the humeral head, which we debrided, did have extensive changes on the humeral head.  Full width partial-thickness cuff with diffuse tendinopathy of the supraspinatus and infraspinatus and even the subscapularis.  Mapping was after debridement.  We did not have a full- thickness tear confirmed both from the subacromial side as well as intra- articularly.  This tendon tissue was extremely tenuous relative to tendinopathy.  I did not see a full-thickness component to justify a repair.  Extensive intra-articular debridement was carried out. Subacromially, there was a lot of bursal hypertrophy, inflammation, residual scar, performed aggressive bursectomy.  He had had a previous AC resection.  There was some mild prominence to the acromion, performed a revision acromioplasty.  Shoulder ran free of fluid.  Portals closed with nylon.  Infiltrated intra-articularly with 80 mg of Depo-Medrol and __________ mL of 0.5% Marcaine.  Taken to recovery room in stable condition.     Lockie Pares, M.D.     WDC/MEDQ  D:  07/18/2015  T:   07/19/2015  Job:  [id number]

## 2015-08-03 ENCOUNTER — Other Ambulatory Visit: Payer: Self-pay | Admitting: Cardiovascular Disease

## 2015-10-01 ENCOUNTER — Other Ambulatory Visit: Payer: Self-pay | Admitting: Cardiovascular Disease

## 2015-10-01 MED ORDER — CARVEDILOL 3.125 MG PO TABS: 3.1250 mg | ORAL_TABLET | Freq: Two times a day (BID) | ORAL | Status: DC

## 2015-10-01 MED ORDER — FUROSEMIDE 20 MG PO TABS: ORAL_TABLET | ORAL | Status: DC

## 2015-10-01 MED ORDER — LOSARTAN POTASSIUM 25 MG PO TABS: 12.5000 mg | ORAL_TABLET | Freq: Every evening | ORAL | Status: DC

## 2015-10-01 MED ORDER — ATORVASTATIN CALCIUM 80 MG PO TABS: 80.0000 mg | ORAL_TABLET | Freq: Every day | ORAL | Status: DC

## 2015-10-01 NOTE — Telephone Encounter (Signed)
Done

## 2015-10-01 NOTE — Telephone Encounter (Signed)
Ricardo Zuniga is switching his pharmacy to [facility], [place], [place] He would like all refills called to [facility]. Lipitor 80 mg Coreg 3.125 mg Lasix 20 mg Cozaar 25 mg.

## 2015-10-15 ENCOUNTER — Other Ambulatory Visit (INDEPENDENT_AMBULATORY_CARE_PROVIDER_SITE_OTHER): Payer: Self-pay | Admitting: Otolaryngology

## 2015-10-15 DIAGNOSIS — J329 Chronic sinusitis, unspecified: Secondary | ICD-10-CM

## 2015-10-17 ENCOUNTER — Ambulatory Visit (HOSPITAL_COMMUNITY)
Admission: RE | Admit: 2015-10-17 | Discharge: 2015-10-17 | Disposition: A | Discharge status: Home / Self Care | Payer: Medicare Other | Source: Ambulatory Visit | Attending: Otolaryngology | Admitting: Otolaryngology

## 2015-10-17 DIAGNOSIS — J329 Chronic sinusitis, unspecified: Secondary | ICD-10-CM | POA: Diagnosis present

## 2015-10-17 DIAGNOSIS — J342 Deviated nasal septum: Secondary | ICD-10-CM | POA: Insufficient documentation

## 2015-11-08 ENCOUNTER — Ambulatory Visit (INDEPENDENT_AMBULATORY_CARE_PROVIDER_SITE_OTHER): Payer: Medicare Other | Admitting: Otolaryngology

## 2015-11-08 DIAGNOSIS — J343 Hypertrophy of nasal turbinates: Secondary | ICD-10-CM

## 2015-11-08 DIAGNOSIS — R42 Dizziness and giddiness: Secondary | ICD-10-CM | POA: Diagnosis not present

## 2015-11-08 DIAGNOSIS — J310 Chronic rhinitis: Secondary | ICD-10-CM | POA: Diagnosis not present

## 2015-11-08 DIAGNOSIS — H6983 Other specified disorders of Eustachian tube, bilateral: Secondary | ICD-10-CM

## 2016-03-03 ENCOUNTER — Other Ambulatory Visit: Payer: Self-pay | Admitting: *Deleted

## 2016-03-03 MED ORDER — ATORVASTATIN CALCIUM 80 MG PO TABS: 80.0000 mg | ORAL_TABLET | Freq: Every day | ORAL | 6 refills | Status: DC

## 2016-05-16 ENCOUNTER — Observation Stay (HOSPITAL_COMMUNITY)
Admission: EM | Admit: 2016-05-16 | Discharge: 2016-05-17 | Disposition: A | Discharge status: Home / Self Care | Payer: Medicare Other | Attending: Internal Medicine | Admitting: Internal Medicine

## 2016-05-16 ENCOUNTER — Encounter (HOSPITAL_COMMUNITY): Payer: Self-pay | Admitting: Emergency Medicine

## 2016-05-16 ENCOUNTER — Emergency Department (HOSPITAL_COMMUNITY): Payer: Medicare Other

## 2016-05-16 DIAGNOSIS — F419 Anxiety disorder, unspecified: Secondary | ICD-10-CM | POA: Diagnosis not present

## 2016-05-16 DIAGNOSIS — I110 Hypertensive heart disease with heart failure: Secondary | ICD-10-CM | POA: Diagnosis not present

## 2016-05-16 DIAGNOSIS — I959 Hypotension, unspecified: Secondary | ICD-10-CM | POA: Diagnosis present

## 2016-05-16 DIAGNOSIS — I10 Essential (primary) hypertension: Secondary | ICD-10-CM | POA: Diagnosis not present

## 2016-05-16 DIAGNOSIS — E7800 Pure hypercholesterolemia, unspecified: Secondary | ICD-10-CM | POA: Insufficient documentation

## 2016-05-16 DIAGNOSIS — E785 Hyperlipidemia, unspecified: Secondary | ICD-10-CM | POA: Diagnosis not present

## 2016-05-16 DIAGNOSIS — Z7951 Long term (current) use of inhaled steroids: Secondary | ICD-10-CM | POA: Diagnosis not present

## 2016-05-16 DIAGNOSIS — E118 Type 2 diabetes mellitus with unspecified complications: Secondary | ICD-10-CM

## 2016-05-16 DIAGNOSIS — Z9049 Acquired absence of other specified parts of digestive tract: Secondary | ICD-10-CM | POA: Insufficient documentation

## 2016-05-16 DIAGNOSIS — R079 Chest pain, unspecified: Principal | ICD-10-CM | POA: Diagnosis present

## 2016-05-16 DIAGNOSIS — I509 Heart failure, unspecified: Secondary | ICD-10-CM | POA: Diagnosis not present

## 2016-05-16 DIAGNOSIS — Z856 Personal history of leukemia: Secondary | ICD-10-CM | POA: Insufficient documentation

## 2016-05-16 DIAGNOSIS — E119 Type 2 diabetes mellitus without complications: Secondary | ICD-10-CM

## 2016-05-16 DIAGNOSIS — I420 Dilated cardiomyopathy: Secondary | ICD-10-CM | POA: Diagnosis not present

## 2016-05-16 DIAGNOSIS — E1165 Type 2 diabetes mellitus with hyperglycemia: Secondary | ICD-10-CM | POA: Diagnosis not present

## 2016-05-16 DIAGNOSIS — K219 Gastro-esophageal reflux disease without esophagitis: Secondary | ICD-10-CM | POA: Diagnosis present

## 2016-05-16 DIAGNOSIS — Z87891 Personal history of nicotine dependence: Secondary | ICD-10-CM | POA: Insufficient documentation

## 2016-05-16 DIAGNOSIS — Z79899 Other long term (current) drug therapy: Secondary | ICD-10-CM | POA: Insufficient documentation

## 2016-05-16 DIAGNOSIS — Z7984 Long term (current) use of oral hypoglycemic drugs: Secondary | ICD-10-CM | POA: Insufficient documentation

## 2016-05-16 DIAGNOSIS — M62838 Other muscle spasm: Secondary | ICD-10-CM | POA: Diagnosis not present

## 2016-05-16 DIAGNOSIS — Z8711 Personal history of peptic ulcer disease: Secondary | ICD-10-CM | POA: Diagnosis not present

## 2016-05-16 DIAGNOSIS — I9589 Other hypotension: Secondary | ICD-10-CM | POA: Insufficient documentation

## 2016-05-16 DIAGNOSIS — R000 Tachycardia, unspecified: Secondary | ICD-10-CM | POA: Diagnosis not present

## 2016-05-16 LAB — BASIC METABOLIC PANEL
Anion gap: 11 (ref 5–15)
BUN: 22 mg/dL — ABNORMAL HIGH (ref 6–20)
CO2: 23 mmol/L (ref 22–32)
Calcium: 10 mg/dL (ref 8.9–10.3)
Chloride: 100 mmol/L — ABNORMAL LOW (ref 101–111)
Creatinine, Ser: 1.09 mg/dL (ref 0.61–1.24)
GFR calc Af Amer: >60 mL/min (ref >60)
GFR calc non Af Amer: >60 mL/min (ref >60)
Glucose, Bld: 283 mg/dL — ABNORMAL HIGH (ref 65–99)
Potassium: 4.1 mmol/L (ref 3.5–5.1)
Sodium: 134 mmol/L — ABNORMAL LOW (ref 135–145)

## 2016-05-16 LAB — CBC WITH DIFFERENTIAL/PLATELET
Basophils Absolute: 0 10*3/uL (ref 0.0–0.1)
Basophils Relative: 0 %
Eosinophils Absolute: 0.1 10*3/uL (ref 0.0–0.7)
Eosinophils Relative: 1 %
HCT: 46.4 % (ref 39.0–52.0)
Hemoglobin: 17 g/dL (ref 13.0–17.0)
Lymphocytes Relative: 22 %
Lymphs Abs: 1.6 10*3/uL (ref 0.7–4.0)
MCH: 33.4 pg (ref 26.0–34.0)
MCHC: 36.6 g/dL — ABNORMAL HIGH (ref 30.0–36.0)
MCV: 91.2 fL (ref 78.0–100.0)
Monocytes Absolute: 0.6 10*3/uL (ref 0.1–1.0)
Monocytes Relative: 9 %
Neutro Abs: 4.9 10*3/uL (ref 1.7–7.7)
Neutrophils Relative %: 68 %
Platelets: 254 10*3/uL (ref 150–400)
RBC: 5.09 MIL/uL (ref 4.22–5.81)
RDW: 12.9 % (ref 11.5–15.5)
WBC: 7.1 10*3/uL (ref 4.0–10.5)

## 2016-05-16 LAB — TROPONIN I
Troponin I: <0.03 ng/mL (ref <0.03)
Troponin I: <0.03 ng/mL (ref <0.03)

## 2016-05-16 LAB — GLUCOSE, CAPILLARY: Glucose-Capillary: 171 mg/dL — ABNORMAL HIGH (ref 65–99)

## 2016-05-16 LAB — MAGNESIUM: Magnesium: 2.4 mg/dL (ref 1.7–2.4)

## 2016-05-16 LAB — TSH: TSH: 1.113 u[IU]/mL (ref 0.350–4.500)

## 2016-05-16 LAB — MRSA PCR SCREENING: MRSA by PCR: NEGATIVE

## 2016-05-16 LAB — I-STAT TROPONIN, ED: Troponin i, poc: 0.02 ng/mL (ref 0.00–0.08)

## 2016-05-16 MED ORDER — PANTOPRAZOLE SODIUM 40 MG PO TBEC
40.0000 mg | DELAYED_RELEASE_TABLET | Freq: Every day | ORAL | Status: DC
  Administered 2016-05-16 – 2016-05-17 (×2): 40 mg via ORAL
  Filled 2016-05-16 (×2): qty 1

## 2016-05-16 MED ORDER — CARVEDILOL 3.125 MG PO TABS
3.1250 mg | ORAL_TABLET | Freq: Two times a day (BID) | ORAL | Status: DC
  Administered 2016-05-16 – 2016-05-17 (×2): 3.125 mg via ORAL
  Filled 2016-05-16 (×8): qty 1

## 2016-05-16 MED ORDER — FLUTICASONE PROPIONATE 50 MCG/ACT NA SUSP
1.0000 | Freq: Every day | NASAL | Status: DC
  Administered 2016-05-16 – 2016-05-17 (×2): 1 via NASAL
  Filled 2016-05-16 (×3): qty 16

## 2016-05-16 MED ORDER — SODIUM CHLORIDE 0.9 % IV SOLN
INTRAVENOUS | Status: DC
  Administered 2016-05-16: 23:00:00 via INTRAVENOUS

## 2016-05-16 MED ORDER — GI COCKTAIL ~~LOC~~
30.0000 mL | Freq: Four times a day (QID) | ORAL | Status: DC | PRN
  Filled 2016-05-16: qty 30

## 2016-05-16 MED ORDER — MORPHINE SULFATE (PF) 2 MG/ML IV SOLN: 2.0000 mg | INTRAVENOUS | Status: DC | PRN

## 2016-05-16 MED ORDER — ACETAMINOPHEN 325 MG PO TABS
650.0000 mg | ORAL_TABLET | Freq: Once | ORAL | Status: AC
  Administered 2016-05-16: 650 mg via ORAL
  Filled 2016-05-16: qty 2

## 2016-05-16 MED ORDER — DIAZEPAM 5 MG PO TABS: 5.0000 mg | ORAL_TABLET | Freq: Every day | ORAL | Status: DC | PRN

## 2016-05-16 MED ORDER — ATORVASTATIN CALCIUM 10 MG PO TABS
10.0000 mg | ORAL_TABLET | Freq: Every evening | ORAL | Status: DC
  Administered 2016-05-16: 10 mg via ORAL
  Filled 2016-05-16 (×4): qty 1

## 2016-05-16 MED ORDER — INSULIN ASPART 100 UNIT/ML ~~LOC~~ SOLN
0.0000 [IU] | Freq: Three times a day (TID) | SUBCUTANEOUS | Status: DC
  Administered 2016-05-17: 1 [IU] via SUBCUTANEOUS

## 2016-05-16 MED ORDER — CANAGLIFLOZIN 100 MG PO TABS
300.0000 mg | ORAL_TABLET | Freq: Every day | ORAL | Status: DC
  Administered 2016-05-17: 300 mg via ORAL
  Filled 2016-05-16 (×3): qty 1

## 2016-05-16 MED ORDER — ONDANSETRON HCL 4 MG/2ML IJ SOLN: 4.0000 mg | Freq: Four times a day (QID) | INTRAMUSCULAR | Status: DC | PRN

## 2016-05-16 MED ORDER — LOSARTAN POTASSIUM 25 MG PO TABS
12.5000 mg | ORAL_TABLET | Freq: Every evening | ORAL | Status: DC
  Administered 2016-05-16: 12.5 mg via ORAL
  Filled 2016-05-16 (×3): qty 0.5

## 2016-05-16 MED ORDER — INSULIN DETEMIR 100 UNIT/ML ~~LOC~~ SOLN
5.0000 [IU] | Freq: Every day | SUBCUTANEOUS | Status: DC
  Administered 2016-05-16: 5 [IU] via SUBCUTANEOUS
  Filled 2016-05-16 (×3): qty 0.05

## 2016-05-16 MED ORDER — ACETAMINOPHEN 325 MG PO TABS: 650.0000 mg | ORAL_TABLET | ORAL | Status: DC | PRN

## 2016-05-16 MED ORDER — ENOXAPARIN SODIUM 40 MG/0.4ML ~~LOC~~ SOLN: 40.0000 mg | SUBCUTANEOUS | Status: DC

## 2016-05-16 NOTE — ED Triage Notes (Signed)
After cleaning house, pt had episode of chest tightness, nausea, headache, diaphoretic

## 2016-05-16 NOTE — ED Provider Notes (Signed)
[facility] DEPT Provider Note   CSN: [id number] Arrival date & time: 05/16/16  1600     History   Chief Complaint Chief Complaint  Patient presents with  . Chest Pain    HPI Ricardo Zuniga is a 65 y.o. male.  The history is provided by the patient.  Chest Pain   This is a new problem. Associated symptoms include diaphoresis and nausea. Pertinent negatives include no abdominal pain, no back pain, no headaches and no shortness of breath.  patient presented After an episode of chest pain. Began earlier today. States he had just clean his house without difficulty. Then began to feel his heart pounding out of his chest. States he checked it on his machine at home and had a heart rate of 170 and a blood pressure of 80. He felt like he was going pass out. Had some dull chest pain went back with it. It lasted around 20 minutes and cleared up. States he is feeling a lot better now. Still slight dull chest pain. No fevers or chills. No difficulty with the work that he did this. Previous history of nonischemic cardiomyopathy. No recent change in his medicines. No fevers or chills. No cough. Patient developed redness around his left eye while at a basketball game last night.  Past Medical History:  Diagnosis Date  . Anal fissure   . APML (acute promyelocytic leukemia) in remission ([place])    completed treatment 04/2013  . Difficult intubation   . DM type 2 (diabetes mellitus, type 2) ([place])   . Gastroesophageal reflux disease   . Heart murmur   . Hypercholesteremia   . Nephrolithiasis   . Peptic ulcer disease    remote  . Ureteral colic     Patient Active Problem List   Diagnosis Date Noted  . Normal coronary arteries- cath 12/13/13 12/14/2013  . Dyslipidemia 12/14/2013  . Unstable angina ([place]) 12/11/2013  . Intermediate coronary syndrome ([place]) 12/11/2013  . Acute combined systolic and diastolic heart failure ([place]) 12/10/2013  . DM type 2 (diabetes mellitus, type 2) ([place])  12/10/2013  . APML (acute promyelocytic leukemia) in remission ([place]) 12/10/2013  . Congestive dilated cardiomyopathy- EF 20% at cath 12/13/13 08/10/2012  . Abnormal echocardiogram 07/01/2012  . Aortic insufficiency 07/01/2012  . Hyperlipidemia 07/01/2012  . PERS HX TOBACCO USE PRESENTING HAZARDS HEALTH 01/14/2010  . HYPERTENSION 01/13/2010  . CHEST PAIN 01/13/2010    Past Surgical History:  Procedure Laterality Date  . ANAL FISSURE REPAIR    . APPENDECTOMY    . CATARACT EXTRACTION, BILATERAL    . CERVICAL FUSION    . CHOLECYSTECTOMY    . COLONOSCOPY N/A 06/27/2015   Procedure: COLONOSCOPY;  Surgeon: Rogene Houston, MD;  [facility];  Service: Endoscopy;  Laterality: N/A;  730  . KNEE SURGERY     x3  . LEFT AND RIGHT HEART CATHETERIZATION WITH CORONARY ANGIOGRAM N/A 12/12/2013   Procedure: LEFT AND RIGHT HEART CATHETERIZATION WITH CORONARY ANGIOGRAM;  Surgeon: [name], MD;  Location: [facility] CATH LAB;  Service: Cardiovascular;  Laterality: N/A;  . LUMBAR FUSION    . SHOULDER ACROMIOPLASTY Right 07/18/2015   Procedure: SHOULDER ACROMIOPLASTY;  Surgeon: Earlie Server, MD;  Location: [facility];  Service: Orthopedics;  Laterality: Right;  . SHOULDER ARTHROSCOPY Right 07/18/2015   Procedure: Right shoulder arthroscopy with debridement;  Surgeon: Earlie Server, MD;  Location: [facility];  Service: Orthopedics;  Laterality: Right;  . SHOULDER SURGERY  x3       Home Medications    Prior to Admission medications   Medication Sig Start Date End Date Taking? Authorizing Provider  atorvastatin (LIPITOR) 10 MG tablet Take 10 mg by mouth every evening.  04/29/16  Yes Historical Provider, MD  budesonide (RHINOCORT ALLERGY) 32 MCG/ACT nasal spray Place 1 spray into both nostrils daily.   Yes Historical Provider, MD  carvedilol (COREG) 3.125 MG tablet Take 1 tablet (3.125 mg total) by mouth 2 (two) times daily. 10/01/15  Yes Herminio Commons, MD    diazepam (VALIUM) 5 MG tablet Take 5 mg by mouth daily as needed for anxiety or muscle spasms.  04/16/16  Yes Historical Provider, MD  furosemide (LASIX) 20 MG tablet TAKE ONE TABLET BY MOUTH ONCE DAILY AND  AS  ADVISED Patient taking differently: Take 20 mg by mouth daily.  10/01/15  Yes Herminio Commons, MD  INVOKANA 300 MG TABS Take 1 tablet by mouth daily.  12/05/13  Yes Historical Provider, MD  losartan (COZAAR) 25 MG tablet Take 0.5 tablets (12.5 mg total) by mouth every evening. 10/01/15  Yes Herminio Commons, MD  omeprazole (PRILOSEC) 20 MG capsule Take 20 mg by mouth every morning.    Yes Historical Provider, MD  amoxicillin-clavulanate (AUGMENTIN) 500-125 MG tablet Take 1 tablet by mouth 2 (two) times daily. 10 day course starting on 05/05/2016 05/05/16   Historical Provider, MD    Family History Family History  Problem Relation Age of Onset  . Colon cancer Other     Social History Social History  Substance Use Topics  . Smoking status: Former Smoker    Packs/day: 2.00    Years: 17.00    Types: Cigarettes    Start date: 02/17/1968    Quit date: 02/28/1986  . Smokeless tobacco: Never Used  . Alcohol use No     Allergies   Lisinopril and Pseudoephedrine   Review of Systems Review of Systems  Constitutional: Positive for diaphoresis. Negative for appetite change.  Eyes: Negative for photophobia.  Respiratory: Negative for shortness of breath.   Cardiovascular: Positive for chest pain.  Gastrointestinal: Positive for nausea. Negative for abdominal pain.  Genitourinary: Negative for flank pain.  Musculoskeletal: Negative for back pain.  Neurological: Negative for headaches.  Psychiatric/Behavioral: Negative for decreased concentration.     Physical Exam Updated Vital Signs BP 127/79 (BP Location: Right Arm)   Pulse 85   Temp 98.5 F (36.9 C) (Oral)   Resp 19   Ht 5\' 9"  (1.753 m)   Wt 186 lb (84.4 kg)   SpO2 97%   BMI 27.47 kg/m   Physical Exam   Constitutional: He appears well-developed.  HENT:  Head: Atraumatic.  Eyes: EOM are normal.  Subconjunctival hematoma on left.  Neck: Neck supple.  Cardiovascular: Normal rate.   Pulmonary/Chest: Effort normal.  Abdominal: Soft.  Musculoskeletal: Normal range of motion.  Neurological: He is alert.  Skin: Skin is warm. Capillary refill takes less than 2 seconds.  Psychiatric: He has a normal mood and affect.     ED Treatments / Results  Labs (all labs ordered are listed, but only abnormal results are displayed) Labs Reviewed  CBC WITH DIFFERENTIAL/PLATELET - Abnormal; Notable for the following:       Result Value   MCHC 36.6 (*)    All other components within normal limits  BASIC METABOLIC PANEL - Abnormal; Notable for the following:    Sodium 134 (*)    Chloride 100 (*)  Glucose, Bld 283 (*)    BUN 22 (*)    All other components within normal limits  TROPONIN I  MAGNESIUM  TSH  I-STAT TROPOININ, ED    EKG  EKG Interpretation  Date/Time:  Friday May 16 2016 16:07:41 EDT Ventricular Rate:  97 PR Interval:    QRS Duration: 101 QT Interval:  369 QTC Calculation: 469 R Axis:   24 Text Interpretation:  Sinus rhythm Probable anteroseptal infarct, old Confirmed by Alvino Chapel  MD, Aara Jacquot [phone]) on 05/16/2016 4:13:02 PM       Radiology Dg Chest 2 View  Result Date: 05/16/2016 CLINICAL DATA:  Central chest pressure and pain beginning today, associated nausea, headache, diaphoresis, has had an intermittent cough since having a cold 2 weeks ago, past history type II diabetes mellitus, ischemia in remission, hypertension, former smoker EXAM: CHEST  2 VIEW COMPARISON:  12/09/2013 FINDINGS: Normal heart size, mediastinal contours, and pulmonary vascularity. Atherosclerotic calcification aorta. Lungs clear. No pleural effusion or pneumothorax. Bones demineralized. IMPRESSION: No acute abnormalities. Aortic atherosclerosis. Electronically Signed   By: Lavonia Dana M.D.   On:  05/16/2016 16:55    Procedures Procedures (including critical care time)  Medications Ordered in ED Medications  acetaminophen (TYLENOL) tablet 650 mg (650 mg Oral Given 05/16/16 1800)     Initial Impression / Assessment and Plan / ED Course  I have reviewed the triage vital signs and the nursing notes.  Pertinent labs & imaging results that were available during my care of the patient were reviewed by me and considered in my medical decision making (see chart for details).  Clinical Course  Patient presented with an episode of tachycardia and hypotension. Also had some chest pain. Slight chest pain now. EKG reassuring. Previous history of SVT. The tachycardia was not captured on any EKG. States it went away on its own. Has known 40% RCA lesion. Has had previous nonischemic cardiomyopathy. Will admit for chest pain observation and cardiac monitoring.  Final Clinical Impressions(s) / ED Diagnoses   Final diagnoses:  Chest pain, unspecified type  Tachycardia    New Prescriptions New Prescriptions   No medications on file     Davonna Belling, MD 05/16/16 1818

## 2016-05-16 NOTE — H&P (Signed)
History and Physical    Ricardo Zuniga [id number] DOB: [birth year] DOA: 05/16/2016  Referring MD/NP/PA: Dr. PCP: Glenda Chroman, MD  Patient coming from: Home  Chief Complaint: Chest pain  HPI: Ricardo Zuniga is a 65 y.o. male with medical history significant of APML(in remission), DM type 2, and GERD; who presents with complaints of chest pain. Patient notes that symptoms today after he had just finished cleaning the house. He reports acute onset of feeling like his heart was pounding out of his chest. He checked as heart rate on a home machine and it read 123XX123 bpm with systolic blood pressure in the 80s. Chest pain was described as a tightness with intermittent sharpthat radiated to his back and between his shoulder blades. Symptoms lasted approximately 20 minutes and then started to self resolved. Patient denies trying anything to alleviate symptoms besides staying still. Nothing seemed to aggravate/worsen symptoms. Associated symptoms included some diaphoresis and nausea . Denies vomiting, shortness of breath, leg swelling, changes in vision, focal weakness, or loss of consciousness. Patient notes that he had similar symptoms when receiving chemotherapy previously for APML at [facility] in 2015.  He was thought to be in SVT at that time he was diagnosed with an infectious cardiomyopathy, but his heart function was noted to be back to normal. Otherwise, the patient notes that he had gone to the [facility] game last night and was fine except for maybe a little cough. Prior to the start of the game the patient had noted his left eye was bloodshot. Denies any trauma, itching, irritation, or recent sick contacts.   ED Course: Upon admission to the emergency department patient was evaluated and seen to be afebrile with respirations of 13-23, and all other vitals relatively within normal limits. Lab work revealed CBC wnl, sodium 134, potassium 4.1, chloride 100, CO2 23, BUN 22, creatinine 1.09, calcium 10,  glucose 283, troponin <0.03. EKG was reassuring with no significant ischemic changes. TRH called to admit for monitoring overnight.   Review of Systems: As per HPI otherwise 10 point review of systems negative.   Past Medical History:  Diagnosis Date  . Anal fissure   . APML (acute promyelocytic leukemia) in remission ([place])    completed treatment 04/2013  . Difficult intubation   . DM type 2 (diabetes mellitus, type 2) ([place])   . Gastroesophageal reflux disease   . Heart murmur   . Hypercholesteremia   . Nephrolithiasis   . Peptic ulcer disease    remote  . Ureteral colic     Past Surgical History:  Procedure Laterality Date  . ANAL FISSURE REPAIR    . APPENDECTOMY    . CATARACT EXTRACTION, BILATERAL    . CERVICAL FUSION    . CHOLECYSTECTOMY    . COLONOSCOPY N/A 06/27/2015   Procedure: COLONOSCOPY;  Surgeon: Rogene Houston, MD;  [facility];  Service: Endoscopy;  Laterality: N/A;  730  . KNEE SURGERY     x3  . LEFT AND RIGHT HEART CATHETERIZATION WITH CORONARY ANGIOGRAM N/A 12/12/2013   Procedure: LEFT AND RIGHT HEART CATHETERIZATION WITH CORONARY ANGIOGRAM;  Surgeon: [name], MD;  Location: [facility] CATH LAB;  Service: Cardiovascular;  Laterality: N/A;  . LUMBAR FUSION    . SHOULDER ACROMIOPLASTY Right 07/18/2015   Procedure: SHOULDER ACROMIOPLASTY;  Surgeon: Earlie Server, MD;  Location: [place];  Service: Orthopedics;  Laterality: Right;  . SHOULDER ARTHROSCOPY Right 07/18/2015   Procedure: Right shoulder arthroscopy with  debridement;  Surgeon: Earlie Server, MD;  Location: [place];  Service: Orthopedics;  Laterality: Right;  . SHOULDER SURGERY     x3     reports that he quit smoking about 30 years ago. His smoking use included Cigarettes. He started smoking about 48 years ago. He has a 34.00 pack-year smoking history. He has never used smokeless tobacco. He reports that he does not drink alcohol or use drugs.  Allergies    Allergen Reactions  . Lisinopril Cough  . Pseudoephedrine Other (See Comments)    Delirium    Family History  Problem Relation Age of Onset  . Colon cancer Other     Prior to Admission medications   Medication Sig Start Date End Date Taking? Authorizing Provider  atorvastatin (LIPITOR) 10 MG tablet Take 10 mg by mouth every evening.  04/29/16  Yes Historical Provider, MD  budesonide (RHINOCORT ALLERGY) 32 MCG/ACT nasal spray Place 1 spray into both nostrils daily.   Yes Historical Provider, MD  carvedilol (COREG) 3.125 MG tablet Take 1 tablet (3.125 mg total) by mouth 2 (two) times daily. 10/01/15  Yes Herminio Commons, MD  diazepam (VALIUM) 5 MG tablet Take 5 mg by mouth daily as needed for anxiety or muscle spasms.  04/16/16  Yes Historical Provider, MD  furosemide (LASIX) 20 MG tablet TAKE ONE TABLET BY MOUTH ONCE DAILY AND  AS  ADVISED Patient taking differently: Take 20 mg by mouth daily.  10/01/15  Yes Herminio Commons, MD  INVOKANA 300 MG TABS Take 1 tablet by mouth daily.  12/05/13  Yes Historical Provider, MD  losartan (COZAAR) 25 MG tablet Take 0.5 tablets (12.5 mg total) by mouth every evening. 10/01/15  Yes Herminio Commons, MD  omeprazole (PRILOSEC) 20 MG capsule Take 20 mg by mouth every morning.    Yes Historical Provider, MD  amoxicillin-clavulanate (AUGMENTIN) 500-125 MG tablet Take 1 tablet by mouth 2 (two) times daily. 10 day course starting on 05/05/2016 05/05/16   Historical Provider, MD    Physical Exam:   Constitutional: NAD, calm, comfortable Vitals:   05/16/16 1700 05/16/16 1715 05/16/16 1800 05/16/16 1900  BP: 127/79 127/79 128/85 115/76  Pulse: 86 85 77 84  Resp: 23 19 14 20   Temp:      TempSrc:      SpO2: 91% 97% 92% 95%  Weight:      Height:       Eyes: PERRL,Subconjunctival hemorrhage of the left eye, otherwise  lids within normal limits. ENMT: Mucous membranes are moist. Posterior pharynx clear of any exudate or lesions.Normal dentition.   Neck: normal, supple, no masses, no thyromegaly Respiratory: clear to auscultation bilaterally, no wheezing, no crackles. Normal respiratory effort. No accessory muscle use.  Cardiovascular: Regular rate and rhythm, no murmurs / rubs / gallops. No extremity edema. 2+ pedal pulses. No carotid bruits.  Abdomen: no tenderness, no masses palpated. No hepatosplenomegaly. Bowel sounds positive.  Musculoskeletal: no clubbing / cyanosis. No joint deformity upper and lower extremities. Good ROM, no contractures. Normal muscle tone.  Skin: no rashes, lesions, ulcers. No induration Neurologic: CN 2-12 grossly intact. Sensation intact, DTR normal. Strength 5/5 in all 4.  Psychiatric: Normal judgment and insight. Alert and oriented x 3. Normal mood.     Labs on Admission: I have personally reviewed following labs and imaging studies  CBC:  Recent Labs Lab 05/16/16 1615  WBC 7.1  NEUTROABS 4.9  HGB 17.0  HCT 46.4  MCV 91.2  PLT  0000000   Basic Metabolic Panel:  Recent Labs Lab 05/16/16 1615  NA 134*  K 4.1  CL 100*  CO2 23  GLUCOSE 283*  BUN 22*  CREATININE 1.09  CALCIUM 10.0  MG 2.4   GFR: Estimated Creatinine Clearance: 66.7 mL/min (by C-G formula based on SCr of 1.09 mg/dL). Liver Function Tests: No results for input(s): AST, ALT, ALKPHOS, BILITOT, PROT, ALBUMIN in the last 168 hours. No results for input(s): LIPASE, AMYLASE in the last 168 hours. No results for input(s): AMMONIA in the last 168 hours. Coagulation Profile: No results for input(s): INR, PROTIME in the last 168 hours. Cardiac Enzymes:  Recent Labs Lab 05/16/16 1615  TROPONINI <0.03   BNP (last 3 results) No results for input(s): PROBNP in the last 8760 hours. HbA1C: No results for input(s): HGBA1C in the last 72 hours. CBG: No results for input(s): GLUCAP in the last 168 hours. Lipid Profile: No results for input(s): CHOL, HDL, LDLCALC, TRIG, CHOLHDL, LDLDIRECT in the last 72 hours. Thyroid Function  Tests:  Recent Labs  05/16/16 1615  TSH 1.113   Anemia Panel: No results for input(s): VITAMINB12, FOLATE, FERRITIN, TIBC, IRON, RETICCTPCT in the last 72 hours. Urine analysis: No results found for: COLORURINE, APPEARANCEUR, LABSPEC, PHURINE, GLUCOSEU, HGBUR, BILIRUBINUR, KETONESUR, PROTEINUR, UROBILINOGEN, NITRITE, LEUKOCYTESUR Sepsis Labs: No results found for this or any previous visit (from the past 240 hour(s)).   Radiological Exams on Admission: Dg Chest 2 View  Result Date: 05/16/2016 CLINICAL DATA:  Central chest pressure and pain beginning today, associated nausea, headache, diaphoresis, has had an intermittent cough since having a cold 2 weeks ago, past history type II diabetes mellitus, ischemia in remission, hypertension, former smoker EXAM: CHEST  2 VIEW COMPARISON:  12/09/2013 FINDINGS: Normal heart size, mediastinal contours, and pulmonary vascularity. Atherosclerotic calcification aorta. Lungs clear. No pleural effusion or pneumothorax. Bones demineralized. IMPRESSION: No acute abnormalities. Aortic atherosclerosis. Electronically Signed   By: Lavonia Dana M.D.   On: 05/16/2016 16:55    EKG: Independently reviewed. Sinus rhythm with possible old anterior septal infarct   Assessment/Plan Chest pain/question of SVT: Acute. Now resolved. - Admit to telemetry bed  - Trend cardiac troponins - Check echocardiogram in a.m. - Recheck EKG in am - follow-up telemetry overnight - May warrant formal cardiology consultation in a.m.    Transient hypotension: Now resolved. Likely secondary to tachycardia. - Continue to monitor  Essential hypertension - Continue Coreg and losartan - Held Lasix it Patient may have and slightly dehydrated with elevated BUN  Diabetes mellitus type 2 with hyperglycemia: Acute. Blood glucose elevated to 283 on admission. - Hypoglycemic protocol - Held Invokana - Levemir 5 units daily at bedtime - CBGs every before meals with sensitive sliding  scale insulin - Adjust insulin regimen as needed  Anxiety/muscle spasm  - Valium prn  Hyperlipidemia - Continue atorvastatin  GERD - Pharmacy substitution of Protonix for omeprazole  DVT prophylaxis: lovenox Code Status: Full Family Communication:  no family present at bedside  Disposition Plan:  discharged home if workup negative  Consults called: none Admission status: Observation telemetry Norval Morton MD [facility] Pager [phone]  If 7PM-7AM, please contact night-coverage [email or web address] Password [email or web address]  05/16/2016, 7:27 PM

## 2016-05-17 ENCOUNTER — Observation Stay (HOSPITAL_COMMUNITY): Payer: Medicare Other

## 2016-05-17 DIAGNOSIS — I10 Essential (primary) hypertension: Secondary | ICD-10-CM

## 2016-05-17 DIAGNOSIS — I959 Hypotension, unspecified: Secondary | ICD-10-CM | POA: Diagnosis present

## 2016-05-17 DIAGNOSIS — R079 Chest pain, unspecified: Secondary | ICD-10-CM | POA: Diagnosis not present

## 2016-05-17 DIAGNOSIS — K219 Gastro-esophageal reflux disease without esophagitis: Secondary | ICD-10-CM | POA: Diagnosis present

## 2016-05-17 LAB — ECHOCARDIOGRAM COMPLETE
Height: 69 in
Weight: 3033.53 oz

## 2016-05-17 LAB — GLUCOSE, CAPILLARY: Glucose-Capillary: 125 mg/dL — ABNORMAL HIGH (ref 65–99)

## 2016-05-17 LAB — TROPONIN I
Troponin I: <0.03 ng/mL (ref <0.03)
Troponin I: <0.03 ng/mL (ref <0.03)

## 2016-05-17 NOTE — Progress Notes (Signed)
Reviewed discharge instructions with client and wife, educated on next dose due and follow up appointments, reviewed s/s to report to MD and/or EMS, instructed on signs of heart attack. Client/cg voiced understanding to all instructions given. Client discharged to home with wife.

## 2016-05-17 NOTE — Progress Notes (Signed)
  Echocardiogram 2D Echocardiogram has been performed.  Ricardo Zuniga 05/17/2016, 8:51 AM

## 2016-05-17 NOTE — Discharge Summary (Signed)
Physician Discharge Summary  Ricardo Zuniga [id number] DOB: [birth year] DOA: 05/16/2016  PCP: Glenda Chroman, MD  Admit date: 05/16/2016 Discharge date: 05/17/2016  Time spent: 45 minutes  Recommendations for Outpatient Follow-up:  -Will be discharged home today. -Will need to follow up with cardiologist for results of ECHO.   Discharge Diagnoses:  Principal Problem:   Chest pain Active Problems:   Essential hypertension   DM type 2 (diabetes mellitus, type 2) (HCC)   Transient hypotension   GERD (gastroesophageal reflux disease)   Discharge Condition: Stable and improved  Filed Weights   05/16/16 1606 05/16/16 2119 05/17/16 0400  Weight: 84.4 kg (186 lb) 85.7 kg (188 lb 15 oz) 86 kg (189 lb 9.5 oz)    History of present illness:  As per Dr. Tamala Julian on 10/6: Ricardo Zuniga is a 65 y.o. male with medical history significant of APML(in remission), DM type 2, and GERD; who presents with complaints of chest pain. Patient notes that symptoms today after he had just finished cleaning the house. He reports acute onset of feeling like his heart was pounding out of his chest. He checked as heart rate on a home machine and it read 123XX123 bpm with systolic blood pressure in the 80s. Chest pain was described as a tightness with intermittent sharpthat radiated to his back and between his shoulder blades. Symptoms lasted approximately 20 minutes and then started to self resolved. Patient denies trying anything to alleviate symptoms besides staying still. Nothing seemed to aggravate/worsen symptoms. Associated symptoms included some diaphoresis and nausea . Denies vomiting, shortness of breath, leg swelling, changes in vision, focal weakness, or loss of consciousness. Patient notes that he had similar symptoms when receiving chemotherapy previously for APML at [facility] in 2015.  He was thought to be in SVT at that time he was diagnosed with an infectious cardiomyopathy, but his heart function was  noted to be back to normal. Otherwise, the patient notes that he had gone to the [facility] game last night and was fine except for maybe a little cough. Prior to the start of the game the patient had noted his left eye was bloodshot. Denies any trauma, itching, irritation, or recent sick contacts.   ED Course: Upon admission to the emergency department patient was evaluated and seen to be afebrile with respirations of 13-23, and all other vitals relatively within normal limits. Lab work revealed CBC wnl, sodium 134, potassium 4.1, chloride 100, CO2 23, BUN 22, creatinine 1.09, calcium 10, glucose 283, troponin <0.03. EKG was reassuring with no significant ischemic changes. TRH called to admit for monitoring overnight.   Hospital Course:   Transient CP and Tachycardia -Occurred while sitting at home. When he used his BP cuff his HR was 179. CP occurred in the setting of this tachycardia. -Suspect probably some SVT. -Continue coreg. -Troponins have been negative, EKG without acute ischemic changes; CP resolved. -No further cardiac work up anticipated. -ECHO results pending at time of DC. -If palpitations and tachycardia recur would recommend placement of a 30 day event monitor.  HTN -Well controlled  DM II -Well controlled.  Hyperlipidemia -Continue statin  GERD -Continue PPI   Procedures:  None   Consultations:  None  Discharge Instructions  Discharge Instructions    Diet - low sodium heart healthy    Complete by:  As directed    Increase activity slowly    Complete by:  As directed        Medication List  STOP taking these medications   amoxicillin-clavulanate 500-125 MG tablet Commonly known as:  AUGMENTIN     TAKE these medications   atorvastatin 10 MG tablet Commonly known as:  LIPITOR Take 10 mg by mouth every evening.   carvedilol 3.125 MG tablet Commonly known as:  COREG Take 1 tablet (3.125 mg total) by mouth 2 (two) times daily.   diazepam 5 MG  tablet Commonly known as:  VALIUM Take 5 mg by mouth daily as needed for anxiety or muscle spasms.   furosemide 20 MG tablet Commonly known as:  LASIX TAKE ONE TABLET BY MOUTH ONCE DAILY AND  AS  ADVISED What changed:  how much to take  how to take this  when to take this  additional instructions   INVOKANA 300 MG Tabs tablet Generic drug:  canagliflozin Take 1 tablet by mouth daily.   losartan 25 MG tablet Commonly known as:  COZAAR Take 0.5 tablets (12.5 mg total) by mouth every evening.   omeprazole 20 MG capsule Commonly known as:  PRILOSEC Take 20 mg by mouth every morning.   RHINOCORT ALLERGY 32 MCG/ACT nasal spray Generic drug:  budesonide Place 1 spray into both nostrils daily.      Allergies  Allergen Reactions  . Lisinopril Cough  . Pseudoephedrine Other (See Comments)    Delirium   Follow-up Information    Glenda Chroman, MD. Schedule an appointment as soon as possible for a visit in 2 week(s).   Specialty:  Internal Medicine Contact information: [place] [address] [phone]            The results of significant diagnostics from this hospitalization (including imaging, microbiology, ancillary and laboratory) are listed below for reference.    Significant Diagnostic Studies: Dg Chest 2 View  Result Date: 05/16/2016 CLINICAL DATA:  Central chest pressure and pain beginning today, associated nausea, headache, diaphoresis, has had an intermittent cough since having a cold 2 weeks ago, past history type II diabetes mellitus, ischemia in remission, hypertension, former smoker EXAM: CHEST  2 VIEW COMPARISON:  12/09/2013 FINDINGS: Normal heart size, mediastinal contours, and pulmonary vascularity. Atherosclerotic calcification aorta. Lungs clear. No pleural effusion or pneumothorax. Bones demineralized. IMPRESSION: No acute abnormalities. Aortic atherosclerosis. Electronically Signed   By: Lavonia Dana M.D.   On: 05/16/2016 16:55     Microbiology: Recent Results (from the past 240 hour(s))  MRSA PCR Screening     Status: None   Collection Time: 05/16/16  8:50 PM  Result Value Ref Range Status   MRSA by PCR NEGATIVE NEGATIVE Final    Comment:        The GeneXpert MRSA Assay (FDA approved for NASAL specimens only), is one component of a comprehensive MRSA colonization surveillance program. It is not intended to diagnose MRSA infection nor to guide or monitor treatment for MRSA infections.      Labs: Basic Metabolic Panel:  Recent Labs Lab 05/16/16 1615  NA 134*  K 4.1  CL 100*  CO2 23  GLUCOSE 283*  BUN 22*  CREATININE 1.09  CALCIUM 10.0  MG 2.4   Liver Function Tests: No results for input(s): AST, ALT, ALKPHOS, BILITOT, PROT, ALBUMIN in the last 168 hours. No results for input(s): LIPASE, AMYLASE in the last 168 hours. No results for input(s): AMMONIA in the last 168 hours. CBC:  Recent Labs Lab 05/16/16 1615  WBC 7.1  NEUTROABS 4.9  HGB 17.0  HCT 46.4  MCV 91.2  PLT 254  Cardiac Enzymes:  Recent Labs Lab 05/16/16 1615 05/16/16 2042 05/16/16 2315 05/17/16 0149  TROPONINI <0.03 <0.03 <0.03 <0.03   BNP: BNP (last 3 results) No results for input(s): BNP in the last 8760 hours.  ProBNP (last 3 results) No results for input(s): PROBNP in the last 8760 hours.  CBG:  Recent Labs Lab 05/16/16 2245 05/17/16 0748  GLUCAP 171* 125*       Signed:  HERNANDEZ ACOSTA,ESTELA  [facility] Pager: [phone] 05/17/2016, 11:11 AM

## 2016-05-19 ENCOUNTER — Other Ambulatory Visit: Payer: Self-pay | Admitting: Cardiovascular Disease

## 2016-05-26 ENCOUNTER — Ambulatory Visit (INDEPENDENT_AMBULATORY_CARE_PROVIDER_SITE_OTHER): Payer: Medicare Other | Admitting: Cardiovascular Disease

## 2016-05-26 ENCOUNTER — Encounter: Payer: Self-pay | Admitting: Cardiovascular Disease

## 2016-05-26 VITALS — BP 128/77 | HR 84 | Ht 69.0 in | Wt 190.0 lb

## 2016-05-26 DIAGNOSIS — I428 Other cardiomyopathies: Secondary | ICD-10-CM

## 2016-05-26 DIAGNOSIS — R079 Chest pain, unspecified: Secondary | ICD-10-CM | POA: Diagnosis not present

## 2016-05-26 DIAGNOSIS — I10 Essential (primary) hypertension: Secondary | ICD-10-CM

## 2016-05-26 DIAGNOSIS — I25118 Atherosclerotic heart disease of native coronary artery with other forms of angina pectoris: Secondary | ICD-10-CM

## 2016-05-26 DIAGNOSIS — R002 Palpitations: Secondary | ICD-10-CM

## 2016-05-26 DIAGNOSIS — I5022 Chronic systolic (congestive) heart failure: Secondary | ICD-10-CM

## 2016-05-26 DIAGNOSIS — I38 Endocarditis, valve unspecified: Secondary | ICD-10-CM

## 2016-05-26 DIAGNOSIS — E7800 Pure hypercholesterolemia, unspecified: Secondary | ICD-10-CM

## 2016-05-26 NOTE — Progress Notes (Signed)
SUBJECTIVE: [name] presents for routine follow up. He has a h/o nonischemic cardiomyopathy and chronic systolic heart failure.  Echo 05/17/16 moderately reduced LV systolic function, EF 123456 (45-50% 05/24/14), grade 1 diastolic dysfunction, and mild aortic and mitral regurgitation.  Hospitalized for chest pain earlier this month. Troponins were normal, ECG showed NSR with nonspecific T wave abnormalities. Was hypertensive and had rapid palpitations, HR 175 bpm at home.  Has felt more fatigued over past 2 months doing routine daily chores around the house. Denies episodes of chest pain prior to being hospitalized.    Review of Systems: As per "subjective", otherwise negative.  Allergies  Allergen Reactions  . Lisinopril Cough  . Pseudoephedrine Other (See Comments)    Delirium    Current Outpatient Prescriptions  Medication Sig Dispense Refill  . atorvastatin (LIPITOR) 10 MG tablet Take 10 mg by mouth every evening.     . budesonide (RHINOCORT ALLERGY) 32 MCG/ACT nasal spray Place 1 spray into both nostrils daily.    . carvedilol (COREG) 3.125 MG tablet Take 1 tablet (3.125 mg total) by mouth 2 (two) times daily. 60 tablet 6  . diazepam (VALIUM) 5 MG tablet Take 5 mg by mouth daily as needed for anxiety or muscle spasms.     . furosemide (LASIX) 20 MG tablet TAKE ONE TABLET BY MOUTH ONCE DAILY AND AS DIRECTED 45 tablet 6  . INVOKANA 300 MG TABS Take 1 tablet by mouth daily.     Marland Kitchen losartan (COZAAR) 25 MG tablet Take 0.5 tablets (12.5 mg total) by mouth every evening. 15 tablet 6  . omeprazole (PRILOSEC) 20 MG capsule Take 20 mg by mouth every morning.      No current facility-administered medications for this visit.     Past Medical History:  Diagnosis Date  . Anal fissure   . APML (acute promyelocytic leukemia) in remission ([place])    completed treatment 04/2013  . Difficult intubation   . DM type 2 (diabetes mellitus, type 2) ([place])   . Gastroesophageal reflux  disease   . Heart murmur   . Hypercholesteremia   . Nephrolithiasis   . Peptic ulcer disease    remote  . Ureteral colic     Past Surgical History:  Procedure Laterality Date  . ANAL FISSURE REPAIR    . APPENDECTOMY    . CATARACT EXTRACTION, BILATERAL    . CERVICAL FUSION    . CHOLECYSTECTOMY    . COLONOSCOPY N/A 06/27/2015   Procedure: COLONOSCOPY;  Surgeon: Rogene Houston, MD;  [facility];  Service: Endoscopy;  Laterality: N/A;  730  . KNEE SURGERY     x3  . LEFT AND RIGHT HEART CATHETERIZATION WITH CORONARY ANGIOGRAM N/A 12/12/2013   Procedure: LEFT AND RIGHT HEART CATHETERIZATION WITH CORONARY ANGIOGRAM;  Surgeon: [name], MD;  Location: [facility] CATH LAB;  Service: Cardiovascular;  Laterality: N/A;  . LUMBAR FUSION    . SHOULDER ACROMIOPLASTY Right 07/18/2015   Procedure: SHOULDER ACROMIOPLASTY;  Surgeon: Earlie Server, MD;  Location: [place];  Service: Orthopedics;  Laterality: Right;  . SHOULDER ARTHROSCOPY Right 07/18/2015   Procedure: Right shoulder arthroscopy with debridement;  Surgeon: Earlie Server, MD;  Location: [place];  Service: Orthopedics;  Laterality: Right;  . SHOULDER SURGERY     x3    Social History   Social History  . Marital status: Married    Spouse name: N/A  . Number of children: N/A  .  Years of education: N/A   Occupational History  . [employer]  . SECURITY GUARD    Social History Main Topics  . Smoking status: Former Smoker    Packs/day: 2.00    Years: 17.00    Types: Cigarettes    Start date: 02/17/1968    Quit date: 02/28/1986  . Smokeless tobacco: Never Used  . Alcohol use No  . Drug use: No  . Sexual activity: Yes   Other Topics Concern  . Not on file   Social History Narrative  . No narrative on file     Vitals:   05/26/16 1338  BP: 128/77  Pulse: 84  SpO2: 97%  Weight: 190 lb (86.2 kg)  Height: 5\' 9"  (1.753 m)    PHYSICAL EXAM General:  NAD HEENT: Normal. Neck: No JVD, no thyromegaly. Lungs: Clear to auscultation bilaterally with normal respiratory effort. CV: Nondisplaced PMI.  Regular rate and rhythm, normal S1/S2, no S3/S4, no murmur. No pretibial or periankle edema.     Abdomen: Soft, nontender, no distention.  Neurologic: Alert and oriented.  Psych: Normal affect. Skin: Normal. Musculoskeletal: No gross deformities.    ECG: Most recent ECG reviewed.      ASSESSMENT AND PLAN: 1. Nonischemic cardiomyopathy/chronic systolic heart failure: EF 35-40% (45-50% 05/24/14). He is currently compensated and euvolemic. He is intolerant of ACEI's as they caused a dry cough.  Continue low-dose losartan and Coreg. I previously instructed him to take an extra tablet of Lasix should he experience increasing shortness of breath, leg swelling, or weight gain, and to inform me if this does occur. His cardiomyopathy may be related to a viral etiology (prior infections related to Hickman catheter) or a tachycardia mediated etiology. There has been interval decline in LV systolic function as reviewed above. Will obtain exercise Myoview to assess for RCA lesion progression.  2. Essential hypertension: Well controlled on current regimen. No changes.  3. CAD with 50-60% RCA lesion: No changes to medication regimen which include ASA, Coreg, and statin.  Will obtain exercise Myoview to assess for RCA lesion progression.  4. Valvular heart disease: Stable. Will continue to monitor.  5. Palpitations: Currently stable. Consider event monitoring in the future.  Dispo: fu 6 wks   Kate Sable, M.D., F.A.C.C.

## 2016-05-26 NOTE — Patient Instructions (Signed)
Medication Instructions:  Your physician recommends that you continue on your current medications as directed. Please refer to the Current Medication list given to you today.   Labwork: NONE  Testing/Procedures: Your physician has requested that you have en exercise stress myoview. For further information please visit [email or web address]. Please follow instruction sheet, as given.    Follow-Up: Your physician recommends that you schedule a follow-up appointment in: 6 WEEKS    Any Other Special Instructions Will Be Listed Below (If Applicable).  THE MORNING OF STRESS TEST- DO NOT TAKE COREG   If you need a refill on your cardiac medications before your next appointment, please call your pharmacy.

## 2016-05-29 ENCOUNTER — Encounter (HOSPITAL_COMMUNITY): Payer: Self-pay

## 2016-05-29 ENCOUNTER — Inpatient Hospital Stay (HOSPITAL_COMMUNITY): Admission: RE | Admit: 2016-05-29 | Payer: Medicare Other | Source: Ambulatory Visit

## 2016-05-29 ENCOUNTER — Encounter (HOSPITAL_COMMUNITY)
Admission: RE | Admit: 2016-05-29 | Discharge: 2016-05-29 | Disposition: A | Discharge status: Home / Self Care | Payer: Medicare Other | Source: Ambulatory Visit | Attending: Cardiovascular Disease | Admitting: Cardiovascular Disease

## 2016-05-29 DIAGNOSIS — R079 Chest pain, unspecified: Secondary | ICD-10-CM

## 2016-05-29 LAB — NM MYOCAR MULTI W/SPECT W/WALL MOTION / EF
Estimated workload: 7 METS
Exercise duration (min): 6 min
Exercise duration (sec): 0 s
LV dias vol: 120 mL (ref 62–150)
LV sys vol: 78 mL
MPHR: 154 {beats}/min
Peak HR: 141 {beats}/min
Percent HR: 91 %
RATE: 0.4
RPE: 17
Rest HR: 83 {beats}/min
SDS: 3
SRS: 0
SSS: 3
TID: 0.97

## 2016-05-29 MED ORDER — TECHNETIUM TC 99M TETROFOSMIN IV KIT
10.0000 | PACK | Freq: Once | INTRAVENOUS | Status: AC | PRN
  Administered 2016-05-29: 10 via INTRAVENOUS

## 2016-05-29 MED ORDER — REGADENOSON 0.4 MG/5ML IV SOLN
INTRAVENOUS | Status: AC
  Filled 2016-05-29: qty 5

## 2016-05-29 MED ORDER — TECHNETIUM TC 99M TETROFOSMIN IV KIT
30.0000 | PACK | Freq: Once | INTRAVENOUS | Status: AC | PRN
  Administered 2016-05-29: 30 via INTRAVENOUS

## 2016-05-29 MED ORDER — SODIUM CHLORIDE 0.9% FLUSH
INTRAVENOUS | Status: AC
  Filled 2016-05-29: qty 10

## 2016-06-26 ENCOUNTER — Ambulatory Visit: Payer: Medicare PPO | Admitting: Cardiovascular Disease

## 2016-07-07 ENCOUNTER — Ambulatory Visit (INDEPENDENT_AMBULATORY_CARE_PROVIDER_SITE_OTHER): Payer: Medicare Other | Admitting: Cardiovascular Disease

## 2016-07-07 ENCOUNTER — Encounter: Payer: Self-pay | Admitting: Cardiovascular Disease

## 2016-07-07 VITALS — BP 132/78 | HR 78 | Ht 69.0 in | Wt 194.4 lb

## 2016-07-07 DIAGNOSIS — I5022 Chronic systolic (congestive) heart failure: Secondary | ICD-10-CM

## 2016-07-07 DIAGNOSIS — I428 Other cardiomyopathies: Secondary | ICD-10-CM

## 2016-07-07 DIAGNOSIS — I10 Essential (primary) hypertension: Secondary | ICD-10-CM

## 2016-07-07 DIAGNOSIS — I25118 Atherosclerotic heart disease of native coronary artery with other forms of angina pectoris: Secondary | ICD-10-CM

## 2016-07-07 DIAGNOSIS — I38 Endocarditis, valve unspecified: Secondary | ICD-10-CM

## 2016-07-07 DIAGNOSIS — E7800 Pure hypercholesterolemia, unspecified: Secondary | ICD-10-CM

## 2016-07-07 DIAGNOSIS — R002 Palpitations: Secondary | ICD-10-CM

## 2016-07-07 NOTE — Progress Notes (Signed)
SUBJECTIVE: Mr. Beger presents for follow-up after undergoing nuclear stress testing which demonstrated a defect in the inferior wall suggestive of possible prior myocardial infarction. There was no ischemia.  He has a h/o nonischemic cardiomyopathy and chronic systolic heart failure.  Echo 05/17/16 moderately reduced LV systolic function, EF 123456 (45-50% 05/24/14), grade 1 diastolic dysfunction, and mild aortic and mitral regurgitation.  His energy level fluctuates but he is able to do quite a bit of gardening and walking without any exertional dyspnea or chest pressure. He has episodes of palpitations lasting 5 minutes once every 2 weeks or so not accompanied by shortness of breath.   Review of Systems: As per "subjective", otherwise negative.  Allergies  Allergen Reactions  . Lisinopril Cough  . Pseudoephedrine Other (See Comments)    Delirium    Current Outpatient Prescriptions  Medication Sig Dispense Refill  . atorvastatin (LIPITOR) 10 MG tablet Take 10 mg by mouth every evening.     . budesonide (RHINOCORT ALLERGY) 32 MCG/ACT nasal spray Place 1 spray into both nostrils daily.    . carvedilol (COREG) 3.125 MG tablet Take 1 tablet (3.125 mg total) by mouth 2 (two) times daily. 60 tablet 6  . diazepam (VALIUM) 5 MG tablet Take 5 mg by mouth daily as needed for anxiety or muscle spasms.     . furosemide (LASIX) 20 MG tablet TAKE ONE TABLET BY MOUTH ONCE DAILY AND AS DIRECTED 45 tablet 6  . INVOKANA 300 MG TABS Take 1 tablet by mouth daily.     Marland Kitchen losartan (COZAAR) 25 MG tablet Take 0.5 tablets (12.5 mg total) by mouth every evening. 15 tablet 6  . omeprazole (PRILOSEC) 20 MG capsule Take 20 mg by mouth every morning.      No current facility-administered medications for this visit.     Past Medical History:  Diagnosis Date  . Anal fissure   . APML (acute promyelocytic leukemia) in remission ([place])    completed treatment 04/2013  . Difficult intubation   . DM type  2 (diabetes mellitus, type 2) ([place])   . Gastroesophageal reflux disease   . Heart murmur   . Hypercholesteremia   . Nephrolithiasis   . Peptic ulcer disease    remote  . Ureteral colic     Past Surgical History:  Procedure Laterality Date  . ANAL FISSURE REPAIR    . APPENDECTOMY    . CATARACT EXTRACTION, BILATERAL    . CERVICAL FUSION    . CHOLECYSTECTOMY    . COLONOSCOPY N/A 06/27/2015   Procedure: COLONOSCOPY;  Surgeon: Rogene Houston, MD;  [facility];  Service: Endoscopy;  Laterality: N/A;  730  . KNEE SURGERY     x3  . LEFT AND RIGHT HEART CATHETERIZATION WITH CORONARY ANGIOGRAM N/A 12/12/2013   Procedure: LEFT AND RIGHT HEART CATHETERIZATION WITH CORONARY ANGIOGRAM;  Surgeon: [name], MD;  Location: [facility] CATH LAB;  Service: Cardiovascular;  Laterality: N/A;  . LUMBAR FUSION    . SHOULDER ACROMIOPLASTY Right 07/18/2015   Procedure: SHOULDER ACROMIOPLASTY;  Surgeon: Earlie Server, MD;  Location: [place];  Service: Orthopedics;  Laterality: Right;  . SHOULDER ARTHROSCOPY Right 07/18/2015   Procedure: Right shoulder arthroscopy with debridement;  Surgeon: Earlie Server, MD;  Location: [place];  Service: Orthopedics;  Laterality: Right;  . SHOULDER SURGERY     x3    Social History   Social History  . Marital status: Married  Spouse name: N/A  . Number of children: N/A  . Years of education: N/A   Occupational History  . [employer]  . SECURITY GUARD    Social History Main Topics  . Smoking status: Former Smoker    Packs/day: 2.00    Years: 17.00    Types: Cigarettes    Start date: 02/17/1968    Quit date: 02/28/1986  . Smokeless tobacco: Never Used  . Alcohol use No  . Drug use: No  . Sexual activity: Yes   Other Topics Concern  . Not on file   Social History Narrative  . No narrative on file     Vitals:   07/07/16 0902  BP: 132/78  Pulse: 78  SpO2: 97%  Weight: 194 lb 6.4 oz  (88.2 kg)  Height: 5\' 9"  (1.753 m)    PHYSICAL EXAM General: NAD HEENT: Normal. Neck: No JVD, no thyromegaly. Lungs: Clear to auscultation bilaterally with normal respiratory effort. CV: Nondisplaced PMI.  Regular rate and rhythm, normal S1/S2, no S3/S4, no murmur. No pretibial or periankle edema.   Abdomen: Soft, nontender, no distention.  Neurologic: Alert and oriented.  Psych: Normal affect. Skin: Normal. Musculoskeletal: No gross deformities.    ECG: Most recent ECG reviewed.      ASSESSMENT AND PLAN: 1. Nonischemic cardiomyopathy/chronic systolic heart failure: EF 35-40% (45-50% 05/24/14). He is currently compensated and euvolemic. He is intolerant of ACEI's as they caused a dry cough.  Continue low-dose losartan and Coreg. I previously instructed him to take an extra tablet of Lasix should he experience increasing shortness of breath, leg swelling, or weight gain, and to inform me if this does occur. His cardiomyopathy may be related to a viral etiology (prior infections related to Hickman catheter) or a tachycardia mediated etiology. There has been interval decline in LV systolic function as reviewed above. Exercise Myoview reviewed above (no ischemia) with possible old infarct.  2. Essential hypertension: Well controlled on current regimen. No changes.  3. CAD with 50-60% RCA lesion: No changes to medication regimen which include ASA, Coreg, and statin.   4. Valvular heart disease: Stable. Will continue to monitor.  5. Palpitations: Currently stable. Consider event monitoring in the future.  Dispo: fu 6 months   Kate Sable, M.D., F.A.C.C.

## 2016-07-07 NOTE — Patient Instructions (Signed)

## 2016-11-24 ENCOUNTER — Other Ambulatory Visit: Payer: Self-pay

## 2016-11-24 MED ORDER — FUROSEMIDE 20 MG PO TABS: ORAL_TABLET | ORAL | 0 refills | Status: DC

## 2017-03-02 ENCOUNTER — Other Ambulatory Visit: Payer: Self-pay | Admitting: *Deleted

## 2017-03-02 MED ORDER — FUROSEMIDE 20 MG PO TABS: ORAL_TABLET | ORAL | 0 refills | Status: DC

## 2017-07-06 ENCOUNTER — Other Ambulatory Visit: Payer: Self-pay | Admitting: *Deleted

## 2017-07-06 MED ORDER — FUROSEMIDE 20 MG PO TABS: ORAL_TABLET | ORAL | 0 refills | Status: DC

## 2017-07-29 ENCOUNTER — Other Ambulatory Visit: Payer: Self-pay

## 2017-07-29 ENCOUNTER — Ambulatory Visit: Payer: Medicare Other | Admitting: Cardiovascular Disease

## 2017-07-29 ENCOUNTER — Encounter: Payer: Self-pay | Admitting: Cardiovascular Disease

## 2017-07-29 VITALS — BP 128/68 | HR 90 | Ht 69.0 in | Wt 196.0 lb

## 2017-07-29 DIAGNOSIS — I25118 Atherosclerotic heart disease of native coronary artery with other forms of angina pectoris: Secondary | ICD-10-CM | POA: Diagnosis not present

## 2017-07-29 DIAGNOSIS — I38 Endocarditis, valve unspecified: Secondary | ICD-10-CM

## 2017-07-29 DIAGNOSIS — I5022 Chronic systolic (congestive) heart failure: Secondary | ICD-10-CM

## 2017-07-29 DIAGNOSIS — I10 Essential (primary) hypertension: Secondary | ICD-10-CM

## 2017-07-29 DIAGNOSIS — I428 Other cardiomyopathies: Secondary | ICD-10-CM | POA: Diagnosis not present

## 2017-07-29 DIAGNOSIS — E7800 Pure hypercholesterolemia, unspecified: Secondary | ICD-10-CM

## 2017-07-29 MED ORDER — FUROSEMIDE 20 MG PO TABS: ORAL_TABLET | ORAL | 3 refills | Status: DC

## 2017-07-29 NOTE — Patient Instructions (Signed)

## 2017-07-29 NOTE — Progress Notes (Signed)
SUBJECTIVE: The patient presents for annual follow-up.  He previously underwent a nuclear stress test which demonstrated a defect in the inferior wall suggestive of possible prior myocardial infarction. There was no ischemia.  He has a h/o nonischemic cardiomyopathy and chronic systolic heart failure.  Echo 05/17/16 moderately reduced LV systolic function, EF 52-84% (45-50% 05/24/14), grade 1 diastolic dysfunction, and mild aortic and mitral regurgitation.  ECG performed today which I personally interpreted demonstrated sinus rhythm with nonspecific T wave abnormalities.  He denies chest pain, leg swelling, orthopnea, and paroxysmal nocturnal dyspnea.  He seldom has to take an extra tablet of Lasix this only occurs when he has a high sodium load.  He does feel fatigued at times and gets short of breath with exertion but this is stable and has not gotten any worse in the last year.    Review of Systems: As per "subjective", otherwise negative.  Allergies  Allergen Reactions  . Lisinopril Cough  . Pseudoephedrine Other (See Comments)    Delirium    Current Outpatient Medications  Medication Sig Dispense Refill  . atorvastatin (LIPITOR) 10 MG tablet Take 10 mg by mouth every evening.     . budesonide (RHINOCORT ALLERGY) 32 MCG/ACT nasal spray Place 1 spray into both nostrils daily.    . carvedilol (COREG) 3.125 MG tablet Take 1 tablet (3.125 mg total) by mouth 2 (two) times daily. 60 tablet 6  . diazepam (VALIUM) 5 MG tablet Take 5 mg by mouth daily as needed for anxiety or muscle spasms.     . furosemide (LASIX) 20 MG tablet TAKE ONE TABLET BY MOUTH ONCE DAILY AND AS DIRECTED 30 tablet 0  . INVOKANA 300 MG TABS Take 1 tablet by mouth daily.     Marland Kitchen losartan (COZAAR) 25 MG tablet Take 0.5 tablets (12.5 mg total) by mouth every evening. 15 tablet 6  . pantoprazole (PROTONIX) 40 MG tablet     . repaglinide (PRANDIN) 2 MG tablet      No current facility-administered medications  for this visit.     Past Medical History:  Diagnosis Date  . Anal fissure   . APML (acute promyelocytic leukemia) in remission ([place])    completed treatment 04/2013  . Difficult intubation   . DM type 2 (diabetes mellitus, type 2) ([place])   . Gastroesophageal reflux disease   . Heart murmur   . Hypercholesteremia   . Nephrolithiasis   . Peptic ulcer disease    remote  . Ureteral colic     Past Surgical History:  Procedure Laterality Date  . ANAL FISSURE REPAIR    . APPENDECTOMY    . CATARACT EXTRACTION, BILATERAL    . CERVICAL FUSION    . CHOLECYSTECTOMY    . COLONOSCOPY N/A 06/27/2015   Procedure: COLONOSCOPY;  Surgeon: Rogene Houston, MD;  [facility];  Service: Endoscopy;  Laterality: N/A;  730  . KNEE SURGERY     x3  . LEFT AND RIGHT HEART CATHETERIZATION WITH CORONARY ANGIOGRAM N/A 12/12/2013   Procedure: LEFT AND RIGHT HEART CATHETERIZATION WITH CORONARY ANGIOGRAM;  Surgeon: [name], MD;  Location: [facility] CATH LAB;  Service: Cardiovascular;  Laterality: N/A;  . LUMBAR FUSION    . SHOULDER ACROMIOPLASTY Right 07/18/2015   Procedure: SHOULDER ACROMIOPLASTY;  Surgeon: Earlie Server, MD;  Location: [place];  Service: Orthopedics;  Laterality: Right;  . SHOULDER ARTHROSCOPY Right 07/18/2015   Procedure: Right shoulder arthroscopy with debridement;  Surgeon:  Earlie Server, MD;  Location: [place];  Service: Orthopedics;  Laterality: Right;  . SHOULDER SURGERY     x3    Social History   Socioeconomic History  . Marital status: Married    Spouse name: Not on file  . Number of children: Not on file  . Years of education: Not on file  . Highest education level: Not on file  Social Needs  . Financial resource strain: Not on file  . Food insecurity - worry: Not on file  . Food insecurity - inability: Not on file  . Transportation needs - medical: Not on file  . Transportation needs - non-medical: Not on file  Occupational  History  . Occupation: RETIRED    Comment: [employer]  . Occupation: SECURITY GUARD  Tobacco Use  . Smoking status: Former Smoker    Packs/day: 2.00    Years: 17.00    Pack years: 34.00    Types: Cigarettes    Start date: 02/17/1968    Last attempt to quit: 02/28/1986    Years since quitting: 31.4  . Smokeless tobacco: Never Used  Substance and Sexual Activity  . Alcohol use: No    Alcohol/week: 0.0 oz  . Drug use: No  . Sexual activity: Yes  Other Topics Concern  . Not on file  Social History Narrative  . Not on file     Vitals:   07/29/17 1418  BP: 128/68  Pulse: 90  SpO2: 97%  Weight: 196 lb (88.9 kg)  Height: 5\' 9"  (1.753 m)    Wt Readings from Last 3 Encounters:  07/29/17 196 lb (88.9 kg)  07/07/16 194 lb 6.4 oz (88.2 kg)  05/26/16 190 lb (86.2 kg)     PHYSICAL EXAM General: NAD HEENT: Normal. Neck: No JVD, no thyromegaly. Lungs: Clear to auscultation bilaterally with normal respiratory effort. CV: Regular rate and rhythm, normal S1/S2, no S3/S4, no murmur. No pretibial or periankle edema.  No carotid bruit.   Abdomen: Soft, nontender, no distention.  Neurologic: Alert and oriented.  Psych: Normal affect. Skin: Normal. Musculoskeletal: No gross deformities.    ECG: Most recent ECG reviewed.   Labs: Lab Results  Component Value Date/Time   K 4.1 05/16/2016 04:15 PM   BUN 22 (H) 05/16/2016 04:15 PM   CREATININE 1.09 05/16/2016 04:15 PM   CREATININE 0.82 08/30/2012 10:16 AM   ALT 22 12/09/2013 07:41 AM   TSH 1.113 05/16/2016 04:15 PM   TSH 0.692 08/16/2012 03:20 PM   HGB 17.0 05/16/2016 04:15 PM   HGB 13.1 09/03/2012 08:41 AM     Lipids: Lab Results  Component Value Date/Time   LDLCALC 163 (H) 12/11/2013 07:23 AM   CHOL 223 (H) 12/11/2013 07:23 AM   TRIG 122 12/11/2013 07:23 AM   HDL 36 (L) 12/11/2013 07:23 AM       ASSESSMENT AND PLAN:  1. Nonischemic cardiomyopathy/chronic systolic heart failure: EF 35-40% on 05/17/16 (45-50%  05/24/14). He is currently compensated and euvolemic. He is intolerant of ACEI's as they caused a dry cough.  Continue low-dose losartan and Coreg. I previously instructed him to take an extra tablet of Lasix should he experience increasing shortness of breath, leg swelling, or weight gain, and to inform me if this does occur. His cardiomyopathy may be related to a viral etiology (prior infections related to Hickman catheter) or a tachycardia mediated etiology. There has been interval declinein LV systolic function as reviewed above. Exercise Myoview reviewed above (no ischemia)  with possible old infarct.  2. Essential hypertension: Well controlled on current regimen. No changes.  3. CAD with 50-60% RCA lesion: No changes to medication regimen which include ASA, Coreg, and statin.   4. Valvular heart disease: Stable. Will continue to monitor.  5. Palpitations: Currently stable. Consider event monitoring in the future.    Disposition: Follow up 1 year.   Kate Sable, M.D., F.A.C.C.

## 2017-10-15 ENCOUNTER — Ambulatory Visit: Payer: Self-pay | Admitting: Physician Assistant

## 2017-10-15 ENCOUNTER — Encounter (HOSPITAL_COMMUNITY): Payer: Self-pay | Admitting: Emergency Medicine

## 2017-10-15 ENCOUNTER — Other Ambulatory Visit: Payer: Self-pay

## 2017-10-15 NOTE — H&P (Signed)
Ricardo Zuniga is an 67 y.o. male.   Chief Complaint: left carpal tunnel HPI: Ricardo Zuniga has a left sided carpal tunnel with significant cervical issues.   Past Medical History:  Diagnosis Date  . Anal fissure   . APML (acute promyelocytic leukemia) in remission ([place])    completed treatment 04/2013  . Coronary artery disease   . Difficult intubation   . DM type 2 (diabetes mellitus, type 2) ([place] Zuniga)   . Gastroesophageal reflux disease   . Heart murmur   . Hypercholesteremia   . Myocardial infarction [facility] Zuniga)    " silent"  . Nephrolithiasis   . Nonischemic cardiomyopathy ([place] Zuniga)   . Peptic ulcer disease    remote  . Ureteral colic   . Wears glasses   . Wears hearing aid in both ears     Past Surgical History:  Procedure Laterality Date  . ANAL FISSURE REPAIR    . APPENDECTOMY    . CATARACT EXTRACTION, BILATERAL    . CERVICAL FUSION    . CHOLECYSTECTOMY    . COLONOSCOPY N/A 06/27/2015   Procedure: COLONOSCOPY;  Surgeon: Rogene Houston, MD;  [facility];  Service: Endoscopy;  Laterality: N/A;  730  . KNEE SURGERY     x3  . LEFT AND RIGHT HEART CATHETERIZATION WITH CORONARY ANGIOGRAM N/A 12/12/2013   Procedure: LEFT AND RIGHT HEART CATHETERIZATION WITH CORONARY ANGIOGRAM;  Surgeon: [name], MD;  Location: [facility] CATH LAB;  Service: Cardiovascular;  Laterality: N/A;  . LUMBAR FUSION    . SHOULDER ACROMIOPLASTY Right 07/18/2015   Procedure: SHOULDER ACROMIOPLASTY;  Surgeon: Earlie Server, MD;  Location: [place];  Service: Orthopedics;  Laterality: Right;  . SHOULDER ARTHROSCOPY Right 07/18/2015   Procedure: Right shoulder arthroscopy with debridement;  Surgeon: Earlie Server, MD;  Location: [place];  Service: Orthopedics;  Laterality: Right;  . SHOULDER SURGERY     x3  . TONSILLECTOMY      Family History  Problem Relation Age of Onset  . Colon cancer Other    Social History:  reports that he quit smoking about 31 years ago. His  smoking use included cigarettes. He started smoking about 49 years ago. He has a 34.00 pack-year smoking history. he has never used smokeless tobacco. He reports that he does not drink alcohol or use drugs.  Allergies:  Allergies  Allergen Reactions  . Lisinopril Cough  . Lisinopril-Hydrochlorothiazide Cough  . Pseudoephedrine Other (See Comments)    Delirium     (Not in a hospital admission)  No results found for this or any previous visit (from the past 48 hour(s)). No results found.  Review of Systems  Musculoskeletal: Positive for joint pain and neck pain.  Neurological: Positive for tingling.  All other systems reviewed and are negative.   There were no vitals taken for this visit. Physical Exam  Constitutional: He is oriented to person, place, and time. He appears well-developed and well-nourished. No distress.  HENT:  Head: Normocephalic and atraumatic.  Eyes: Conjunctivae and EOM are normal. Pupils are equal, round, and reactive to light.  Neck: Neck supple.  Cardiovascular: Normal rate and intact distal pulses.  Respiratory: Effort normal. No respiratory distress.  GI: Soft. He exhibits no distension.  Musculoskeletal:       Left wrist: He exhibits tenderness and swelling.  Positive tinels  Lymphadenopathy:    He has no cervical adenopathy.  Neurological: He is alert and oriented to person, place, and time.  Skin: Skin is warm and dry. No rash noted. No erythema.  Psychiatric: He has a normal mood and affect. His behavior is normal.     Assessment/Plan I think at this point we can get his carpal tunnel done for him. He has two levels of compression and is likely to need surgery but I am not 100% sure if he would be a candidate for an epidural. He had Dr. Ellene Route for his previous neck problems. I think it is the level that is below that is fused.  I would recommend going ahead and getting is carpal tunnel done. He may have symptoms of that in the opposite hand.  In  terms of his most symptomatic issue, he feels like it is the carpal tunnel in the left hand. I think it is a reasonable approach to get that done. He will get that done, hopefully, as soon as we can get it authorized. Choice anesthetic. Risks and benefits discussed in detail. Proceed with scheduling at some point in the foreseeable future.  Post operative I would work on pretty soon getting him in to see Dr. Ellene Route after that.   Chriss Czar, PA-C 10/15/2017, 12:45 PM

## 2017-10-15 NOTE — Progress Notes (Signed)
Pt denies any acute cardiopulmonary issues. Pt under the care of Dr. Bronson Ing, Cardiology. Pt denies having a chest x ray within the last year. Pt denies recent labs (latest labs in [place]). Pt made aware to stop taking  Aspirin, vitamins, fish oil and herbal medications. Do not take any NSAIDs ie: Ibuprofen, Advil, Naproxen (Aleve), Motrin, BC and Goody Powder. Pt made aware to not take Invokana and Prandin on DOS. Pt made aware to check BG every 2 hours prior to arrival to hospital on DOS. Pt made aware to treat a BG < 70 with  4 ounces of apple or cranberry juice, wait 15 minutes after intervention to recheck BG, if BG remains < 70, call Short Stay unit to speak with a nurse. Pt verbalized understanding of all pre-op instructions. Anesthesia asked to review pt history see note).

## 2017-10-15 NOTE — H&P (View-Only) (Signed)
Ricardo Zuniga is an 67 y.o. male.   Chief Complaint: left carpal tunnel HPI: Ricardo Zuniga has a left sided carpal tunnel with significant cervical issues.   Past Medical History:  Diagnosis Date  . Anal fissure   . APML (acute promyelocytic leukemia) in remission ([place])    completed treatment 04/2013  . Coronary artery disease   . Difficult intubation   . DM type 2 (diabetes mellitus, type 2) ([place])   . Gastroesophageal reflux disease   . Heart murmur   . Hypercholesteremia   . Myocardial infarction [facility])    " silent"  . Nephrolithiasis   . Nonischemic cardiomyopathy ([place])   . Peptic ulcer disease    remote  . Ureteral colic   . Wears glasses   . Wears hearing aid in both ears     Past Surgical History:  Procedure Laterality Date  . ANAL FISSURE REPAIR    . APPENDECTOMY    . CATARACT EXTRACTION, BILATERAL    . CERVICAL FUSION    . CHOLECYSTECTOMY    . COLONOSCOPY N/A 06/27/2015   Procedure: COLONOSCOPY;  Surgeon: Rogene Houston, MD;  [facility];  Service: Endoscopy;  Laterality: N/A;  730  . KNEE SURGERY     x3  . LEFT AND RIGHT HEART CATHETERIZATION WITH CORONARY ANGIOGRAM N/A 12/12/2013   Procedure: LEFT AND RIGHT HEART CATHETERIZATION WITH CORONARY ANGIOGRAM;  Surgeon: [name], MD;  Location: [facility] CATH LAB;  Service: Cardiovascular;  Laterality: N/A;  . LUMBAR FUSION    . SHOULDER ACROMIOPLASTY Right 07/18/2015   Procedure: SHOULDER ACROMIOPLASTY;  Surgeon: Earlie Server, MD;  Location: [place];  Service: Orthopedics;  Laterality: Right;  . SHOULDER ARTHROSCOPY Right 07/18/2015   Procedure: Right shoulder arthroscopy with debridement;  Surgeon: Earlie Server, MD;  Location: [place];  Service: Orthopedics;  Laterality: Right;  . SHOULDER SURGERY     x3  . TONSILLECTOMY      Family History  Problem Relation Age of Onset  . Colon cancer Other    Social History:  reports that he quit smoking about 31 years ago. His  smoking use included cigarettes. He started smoking about 49 years ago. He has a 34.00 pack-year smoking history. he has never used smokeless tobacco. He reports that he does not drink alcohol or use drugs.  Allergies:  Allergies  Allergen Reactions  . Lisinopril Cough  . Lisinopril-Hydrochlorothiazide Cough  . Pseudoephedrine Other (See Comments)    Delirium     (Not in a hospital admission)  No results found for this or any previous visit (from the past 48 hour(s)). No results found.  Review of Systems  Musculoskeletal: Positive for joint pain and neck pain.  Neurological: Positive for tingling.  All other systems reviewed and are negative.   There were no vitals taken for this visit. Physical Exam  Constitutional: He is oriented to person, place, and time. He appears well-developed and well-nourished. No distress.  HENT:  Head: Normocephalic and atraumatic.  Eyes: Conjunctivae and EOM are normal. Pupils are equal, round, and reactive to light.  Neck: Neck supple.  Cardiovascular: Normal rate and intact distal pulses.  Respiratory: Effort normal. No respiratory distress.  GI: Soft. He exhibits no distension.  Musculoskeletal:       Left wrist: He exhibits tenderness and swelling.  Positive tinels  Lymphadenopathy:    He has no cervical adenopathy.  Neurological: He is alert and oriented to person, place, and time.  Skin: Skin is warm and dry. No rash noted. No erythema.  Psychiatric: He has a normal mood and affect. His behavior is normal.     Assessment/Plan I think at this point we can get his carpal tunnel done for him. He has two levels of compression and is likely to need surgery but I am not 100% sure if he would be a candidate for an epidural. He had Dr. Ellene Route for his previous neck problems. I think it is the level that is below that is fused.  I would recommend going ahead and getting is carpal tunnel done. He may have symptoms of that in the opposite hand.  In  terms of his most symptomatic issue, he feels like it is the carpal tunnel in the left hand. I think it is a reasonable approach to get that done. He will get that done, hopefully, as soon as we can get it authorized. Choice anesthetic. Risks and benefits discussed in detail. Proceed with scheduling at some point in the foreseeable future.  Post operative I would work on pretty soon getting him in to see Dr. Ellene Route after that.   Chriss Czar, PA-C 10/15/2017, 12:45 PM

## 2017-10-15 NOTE — Progress Notes (Signed)
Anesthesia Chart Review:  Pt is a same-day workup  Pt is a 67 year old male scheduled for left carpal tunnel release on 10/16/2017 with Earlie Server, MD  - PCP is Jerene Bears, MD - Cardiologist is  Kate Sable, MD last office visit 07/29/17; 1 year follow-up recommended. - Oncologist is Randa Spike, MD (notes in care everywhere)   PMH includes: CAD (50% RCA), nonischemic cardiomyopathy, chronic systolic CHF, DM, hyperlipidemia, acute promyelocytic leukemia (in remission), GERD.  Former smoker (quit in 1987).  BMI 29.  S/p R shoulder arthroscopy 07/18/15  Anesthesia history includes difficult intubation.  Due to anterior larynx.  Intubated on 1 attempt 07/18/15 with 7.0 ETT using stylette, oral airway and video laryngoscopy. Anesthesia records in [place].   Labs will be obtained day of surgery  EKG 07/29/17: Sinus  Rhythm. Diffuse nonspecific T-abnormality. Low voltage with rightward P-axis and rotation -possible pulmonary disease.   Nuclear stress test 05/29/16:   Blood pressure demonstrated a normal response to exercise.  Defect 1: There is a medium defect of moderate severity present in the basal inferior, mid inferior and apical inferior location.  Findings consistent with possible prior myocardial infarction.  This is an intermediate risk study based on moderate to severely reduced EF.  Echo 05/17/16: - Left ventricle: The cavity size was normal. Wall thickness was increased in a pattern of moderate LVH. Systolic function was moderately reduced. The estimated ejection fraction was in the range of 35% to 40%. Moderate diffuse hypokinesis. Doppler parameters are consistent with abnormal left ventricular relaxation (grade 1 diastolic dysfunction). - Aortic valve: There was mild regurgitation. - Aortic root: The aortic root was mildly dilated. - Mitral valve: There was mild regurgitation. - Left atrium: The atrium was moderately dilated.  Cardiac cath 12/12/13:   Severe  Nonischemic Cardiomyopathy With No Angiographic CAD (RCA 50-60%), low cardiac output and index by both thermodilution and Fick.  Severely elevated LVEDP despite low right-sided filling pressures and low systemic pressures.  10-13 mmHg Gradient from LVEDP suggesting Mitral Valve Disease  If labs acceptable day of surgery, anticipate patient can proceed as scheduled  Willeen Cass, FNP-BC [facility] Short Stay Surgical Center/Anesthesiology Phone: [phone] 10/15/2017 12:33 PM

## 2017-10-16 ENCOUNTER — Ambulatory Visit (HOSPITAL_COMMUNITY): Payer: Medicare Other | Admitting: Emergency Medicine

## 2017-10-16 ENCOUNTER — Ambulatory Visit (HOSPITAL_COMMUNITY)
Admission: RE | Admit: 2017-10-16 | Discharge: 2017-10-16 | Disposition: A | Discharge status: Home / Self Care | Payer: Medicare Other | Source: Ambulatory Visit | Attending: Orthopedic Surgery | Admitting: Orthopedic Surgery

## 2017-10-16 ENCOUNTER — Encounter (HOSPITAL_COMMUNITY)
Admission: RE | Disposition: A | Discharge status: Home / Self Care | Payer: Self-pay | Source: Ambulatory Visit | Attending: Orthopedic Surgery

## 2017-10-16 ENCOUNTER — Encounter (HOSPITAL_COMMUNITY): Payer: Self-pay | Admitting: *Deleted

## 2017-10-16 DIAGNOSIS — C9241 Acute promyelocytic leukemia, in remission: Secondary | ICD-10-CM | POA: Diagnosis not present

## 2017-10-16 DIAGNOSIS — E119 Type 2 diabetes mellitus without complications: Secondary | ICD-10-CM | POA: Diagnosis not present

## 2017-10-16 DIAGNOSIS — I2510 Atherosclerotic heart disease of native coronary artery without angina pectoris: Secondary | ICD-10-CM | POA: Diagnosis not present

## 2017-10-16 DIAGNOSIS — Z87891 Personal history of nicotine dependence: Secondary | ICD-10-CM | POA: Diagnosis not present

## 2017-10-16 DIAGNOSIS — E7800 Pure hypercholesterolemia, unspecified: Secondary | ICD-10-CM | POA: Insufficient documentation

## 2017-10-16 DIAGNOSIS — I080 Rheumatic disorders of both mitral and aortic valves: Secondary | ICD-10-CM | POA: Diagnosis not present

## 2017-10-16 DIAGNOSIS — K219 Gastro-esophageal reflux disease without esophagitis: Secondary | ICD-10-CM | POA: Insufficient documentation

## 2017-10-16 DIAGNOSIS — I110 Hypertensive heart disease with heart failure: Secondary | ICD-10-CM | POA: Diagnosis not present

## 2017-10-16 DIAGNOSIS — Z888 Allergy status to other drugs, medicaments and biological substances status: Secondary | ICD-10-CM | POA: Insufficient documentation

## 2017-10-16 DIAGNOSIS — G5602 Carpal tunnel syndrome, left upper limb: Secondary | ICD-10-CM | POA: Insufficient documentation

## 2017-10-16 DIAGNOSIS — I509 Heart failure, unspecified: Secondary | ICD-10-CM | POA: Diagnosis not present

## 2017-10-16 DIAGNOSIS — I252 Old myocardial infarction: Secondary | ICD-10-CM | POA: Insufficient documentation

## 2017-10-16 DIAGNOSIS — I43 Cardiomyopathy in diseases classified elsewhere: Secondary | ICD-10-CM | POA: Insufficient documentation

## 2017-10-16 HISTORY — DX: Presence of external hearing-aid: Z97.4

## 2017-10-16 HISTORY — DX: Acute myocardial infarction, unspecified: I21.9

## 2017-10-16 HISTORY — DX: Presence of spectacles and contact lenses: Z97.3

## 2017-10-16 HISTORY — DX: Other cardiomyopathies: I42.8

## 2017-10-16 HISTORY — DX: Atherosclerotic heart disease of native coronary artery without angina pectoris: I25.10

## 2017-10-16 HISTORY — PX: CARPAL TUNNEL RELEASE: SHX101

## 2017-10-16 LAB — BASIC METABOLIC PANEL
Anion gap: 10 (ref 5–15)
BUN: 15 mg/dL (ref 6–20)
CO2: 25 mmol/L (ref 22–32)
Calcium: 9.4 mg/dL (ref 8.9–10.3)
Chloride: 106 mmol/L (ref 101–111)
Creatinine, Ser: 0.68 mg/dL (ref 0.61–1.24)
GFR calc Af Amer: >60 mL/min (ref >60)
GFR calc non Af Amer: >60 mL/min (ref >60)
Glucose, Bld: 161 mg/dL — ABNORMAL HIGH (ref 65–99)
Potassium: 3.8 mmol/L (ref 3.5–5.1)
Sodium: 141 mmol/L (ref 135–145)

## 2017-10-16 LAB — GLUCOSE, CAPILLARY
Glucose-Capillary: 183 mg/dL — ABNORMAL HIGH (ref 65–99)
Glucose-Capillary: 137 mg/dL — ABNORMAL HIGH (ref 65–99)

## 2017-10-16 LAB — CBC
HCT: 43.8 % (ref 39.0–52.0)
Hemoglobin: 15.6 g/dL (ref 13.0–17.0)
MCH: 32.4 pg (ref 26.0–34.0)
MCHC: 35.6 g/dL (ref 30.0–36.0)
MCV: 91.1 fL (ref 78.0–100.0)
Platelets: 184 10*3/uL (ref 150–400)
RBC: 4.81 MIL/uL (ref 4.22–5.81)
RDW: 13.2 % (ref 11.5–15.5)
WBC: 5.4 10*3/uL (ref 4.0–10.5)

## 2017-10-16 SURGERY — CARPAL TUNNEL RELEASE
Anesthesia: Monitor Anesthesia Care | Site: Arm Lower | Laterality: Left

## 2017-10-16 MED ORDER — BUPIVACAINE HCL (PF) 0.5 % IJ SOLN
INTRAMUSCULAR | Status: DC | PRN
  Administered 2017-10-16: 3.5 mL

## 2017-10-16 MED ORDER — FENTANYL CITRATE (PF) 250 MCG/5ML IJ SOLN
INTRAMUSCULAR | Status: AC
  Filled 2017-10-16: qty 5

## 2017-10-16 MED ORDER — CEFAZOLIN SODIUM-DEXTROSE 2-4 GM/100ML-% IV SOLN
2.0000 g | INTRAVENOUS | Status: AC
  Administered 2017-10-16: 2 g via INTRAVENOUS
  Filled 2017-10-16: qty 100

## 2017-10-16 MED ORDER — BUPIVACAINE HCL (PF) 0.5 % IJ SOLN
INTRAMUSCULAR | Status: AC
  Filled 2017-10-16: qty 30

## 2017-10-16 MED ORDER — ONDANSETRON HCL 4 MG/2ML IJ SOLN
INTRAMUSCULAR | Status: DC | PRN
  Administered 2017-10-16: 4 mg via INTRAVENOUS

## 2017-10-16 MED ORDER — PROPOFOL 500 MG/50ML IV EMUL
INTRAVENOUS | Status: DC | PRN
  Administered 2017-10-16: 50 ug/kg/min via INTRAVENOUS

## 2017-10-16 MED ORDER — 0.9 % SODIUM CHLORIDE (POUR BTL) OPTIME
TOPICAL | Status: DC | PRN
  Administered 2017-10-16: 1000 mL

## 2017-10-16 MED ORDER — FENTANYL CITRATE (PF) 100 MCG/2ML IJ SOLN: 25.0000 ug | INTRAMUSCULAR | Status: DC | PRN

## 2017-10-16 MED ORDER — ONDANSETRON HCL 4 MG/2ML IJ SOLN
INTRAMUSCULAR | Status: AC
  Filled 2017-10-16: qty 2

## 2017-10-16 MED ORDER — FENTANYL CITRATE (PF) 100 MCG/2ML IJ SOLN
INTRAMUSCULAR | Status: DC | PRN
  Administered 2017-10-16: 50 ug via INTRAVENOUS

## 2017-10-16 MED ORDER — MIDAZOLAM HCL 2 MG/2ML IJ SOLN
INTRAMUSCULAR | Status: AC
  Filled 2017-10-16: qty 2

## 2017-10-16 MED ORDER — LIDOCAINE HCL 2 % IJ SOLN
INTRAMUSCULAR | Status: DC | PRN
  Administered 2017-10-16: 3.5 mL

## 2017-10-16 MED ORDER — CHLORHEXIDINE GLUCONATE 4 % EX LIQD: 60.0000 mL | Freq: Once | CUTANEOUS | Status: DC

## 2017-10-16 MED ORDER — LACTATED RINGERS IV SOLN
INTRAVENOUS | Status: DC
Start: 2017-10-16 — End: 2017-10-16
  Administered 2017-10-16: 12:00:00 via INTRAVENOUS

## 2017-10-16 MED ORDER — MIDAZOLAM HCL 5 MG/5ML IJ SOLN
INTRAMUSCULAR | Status: DC | PRN
  Administered 2017-10-16: 2 mg via INTRAVENOUS

## 2017-10-16 MED ORDER — LIDOCAINE HCL 2 % IJ SOLN
INTRAMUSCULAR | Status: AC
  Filled 2017-10-16: qty 20

## 2017-10-16 MED ORDER — TRAMADOL HCL 50 MG PO TABS: ORAL_TABLET | ORAL | 0 refills | Status: DC

## 2017-10-16 SURGICAL SUPPLY — 30 items
BANDAGE ACE 3X5.8 VEL STRL LF (GAUZE/BANDAGES/DRESSINGS) ×4 IMPLANT
BNDG CONFORM 3 STRL LF (GAUZE/BANDAGES/DRESSINGS) ×4 IMPLANT
BNDG ESMARK 4X9 LF (GAUZE/BANDAGES/DRESSINGS) ×2 IMPLANT
CORDS BIPOLAR (ELECTRODE) ×2 IMPLANT
COVER SURGICAL LIGHT HANDLE (MISCELLANEOUS) ×2 IMPLANT
CUFF TOURNIQUET SINGLE 18IN (TOURNIQUET CUFF) ×2 IMPLANT
CUFF TOURNIQUET SINGLE 24IN (TOURNIQUET CUFF) IMPLANT
DRSG EMULSION OIL 3X3 NADH (GAUZE/BANDAGES/DRESSINGS) ×2 IMPLANT
DURAPREP 26ML APPLICATOR (WOUND CARE) ×2 IMPLANT
GAUZE SPONGE 4X4 12PLY STRL (GAUZE/BANDAGES/DRESSINGS) ×2 IMPLANT
GAUZE SPONGE 4X4 12PLY STRL LF (GAUZE/BANDAGES/DRESSINGS) ×2 IMPLANT
GLOVE BIOGEL PI IND STRL 8 (GLOVE) ×2 IMPLANT
GLOVE BIOGEL PI INDICATOR 8 (GLOVE) ×2
GLOVE ORTHO TXT STRL SZ7.5 (GLOVE) ×2 IMPLANT
GLOVE SURG ORTHO 8.0 STRL STRW (GLOVE) ×2 IMPLANT
GOWN STRL REUS W/ TWL LRG LVL3 (GOWN DISPOSABLE) ×1 IMPLANT
GOWN STRL REUS W/ TWL XL LVL3 (GOWN DISPOSABLE) ×2 IMPLANT
GOWN STRL REUS W/TWL LRG LVL3 (GOWN DISPOSABLE) ×1
GOWN STRL REUS W/TWL XL LVL3 (GOWN DISPOSABLE) ×2
KIT BASIN OR (CUSTOM PROCEDURE TRAY) ×2 IMPLANT
KIT ROOM TURNOVER OR (KITS) ×2 IMPLANT
NEEDLE HYPO 25X1 1.5 SAFETY (NEEDLE) IMPLANT
NS IRRIG 1000ML POUR BTL (IV SOLUTION) ×2 IMPLANT
PACK ORTHO EXTREMITY (CUSTOM PROCEDURE TRAY) ×2 IMPLANT
PAD ABD 8X10 STRL (GAUZE/BANDAGES/DRESSINGS) ×2 IMPLANT
PAD ARMBOARD 7.5X6 YLW CONV (MISCELLANEOUS) ×2 IMPLANT
SUT ETHILON 4 0 PS 2 18 (SUTURE) ×2 IMPLANT
SYR CONTROL 10ML LL (SYRINGE) IMPLANT
TOWEL OR 17X24 6PK STRL BLUE (TOWEL DISPOSABLE) ×2 IMPLANT
UNDERPAD 30X30 (UNDERPADS AND DIAPERS) ×2 IMPLANT

## 2017-10-16 NOTE — Transfer of Care (Signed)
Immediate Anesthesia Transfer of Care Note  Patient: Ricardo Zuniga  Procedure(s) Performed: LEFT CARPAL TUNNEL RELEASE (Left Arm Lower)  Patient Location: PACU  Anesthesia Type:MAC  Level of Consciousness: awake, alert  and oriented  Airway & Oxygen Therapy: Patient Spontanous Breathing  Post-op Assessment: Report given to RN and Post -op Vital signs reviewed and stable  Post vital signs: Reviewed and stable  Last Vitals:  Vitals:   10/16/17 0940 10/16/17 1308  BP: 125/68 127/84  Pulse: 75 75  Resp: 20 12  Temp: 36.8 C 36.5 C  SpO2: 99% 97%    Last Pain:  Vitals:   10/16/17 1308  TempSrc:   PainSc: 0-No pain      Patients Stated Pain Goal: 10(does not take pain medication) (33/38/32 9191)  Complications: No apparent anesthesia complications

## 2017-10-16 NOTE — Progress Notes (Signed)
Chaplain was paged for support. Walked family to [place] and prayed. Passed on [facility] [place].   Oct 23, 2017 1200  Clinical Encounter Type  Visited With Family  Visit Type Spiritual support  Referral From Nurse  Spiritual Encounters  Spiritual Needs Prayer

## 2017-10-16 NOTE — Anesthesia Preprocedure Evaluation (Addendum)
Anesthesia Evaluation  Patient identified by MRN, date of birth, ID band Patient awake    Reviewed: Allergy & Precautions, NPO status , Patient's Chart, lab work & pertinent test results, reviewed documented beta blocker date and time   History of Anesthesia Complications (+) DIFFICULT AIRWAY  Airway Mallampati: II  TM Distance: >3 FB Neck ROM: Full    Dental   Pulmonary former smoker,    breath sounds clear to auscultation       Cardiovascular hypertension, Pt. on medications and Pt. on home beta blockers + CAD, + Past MI and +CHF  + Valvular Problems/Murmurs  Rhythm:Regular Rate:Normal  Echo 05/17/16: - Left ventricle: The cavity size was normal. Wall thickness was increased in a pattern of moderate LVH. Systolic function was moderately reduced. The estimated ejection fraction was in the range of 35% to 40%. Moderate diffuse hypokinesis. Dopplerparameters are consistent with abnormal left ventricularrelaxation (grade 1 diastolic dysfunction). - Aortic valve: There was mild regurgitation. - Aortic root: The aortic root was mildly dilated. - Mitral valve: There was mild regurgitation. - Left atrium: The atrium was moderately dilated.     Neuro/Psych negative neurological ROS     GI/Hepatic Neg liver ROS, PUD, GERD  ,  Endo/Other  diabetes, Type 2  Renal/GU negative Renal ROS     Musculoskeletal   Abdominal   Peds  Hematology negative hematology ROS (+)   Anesthesia Other Findings   Reproductive/Obstetrics                            Lab Results  Component Value Date   WBC 5.4 10/16/2017   HGB 15.6 10/16/2017   HCT 43.8 10/16/2017   MCV 91.1 10/16/2017   PLT 184 10/16/2017   Lab Results  Component Value Date   CREATININE 0.68 10/16/2017   BUN 15 10/16/2017   NA 141 10/16/2017   K 3.8 10/16/2017   CL 106 10/16/2017   CO2 25 10/16/2017    Anesthesia Physical Anesthesia  Plan  ASA: III  Anesthesia Plan: MAC   Post-op Pain Management:    Induction: Intravenous  PONV Risk Score and Plan: 1 and Ondansetron, Propofol infusion and Treatment may vary due to age or medical condition  Airway Management Planned: Natural Airway and Simple Face Mask  Additional Equipment:   Intra-op Plan:   Post-operative Plan:   Informed Consent: I have reviewed the patients History and Physical, chart, labs and discussed the procedure including the risks, benefits and alternatives for the proposed anesthesia with the patient or authorized representative who has indicated his/her understanding and acceptance.     Plan Discussed with: CRNA  Anesthesia Plan Comments:        Anesthesia Quick Evaluation

## 2017-10-16 NOTE — Interval H&P Note (Signed)
History and Physical Interval Note:  10/16/2017 12:07 PM  Ricardo Zuniga  has presented today for surgery, with the diagnosis of Carpal tunnel syndrome left upper limb  G56.02  The various methods of treatment have been discussed with the patient and family. After consideration of risks, benefits and other options for treatment, the patient has consented to  Procedure(s): LEFT CARPAL TUNNEL RELEASE (Left) as a surgical intervention .  The patient's history has been reviewed, patient examined, no change in status, stable for surgery.  I have reviewed the patient's chart and labs.  Questions were answered to the patient's satisfaction.     Yvette Rack

## 2017-10-19 ENCOUNTER — Encounter (HOSPITAL_COMMUNITY): Payer: Self-pay | Admitting: Orthopedic Surgery

## 2017-10-19 NOTE — Anesthesia Postprocedure Evaluation (Signed)
Anesthesia Post Note  Patient: Ricardo Zuniga  Procedure(s) Performed: LEFT CARPAL TUNNEL RELEASE (Left Arm Lower)     Patient location during evaluation: PACU Anesthesia Type: MAC Level of consciousness: awake and alert Pain management: pain level controlled Vital Signs Assessment: post-procedure vital signs reviewed and stable Respiratory status: spontaneous breathing, nonlabored ventilation, respiratory function stable and patient connected to nasal cannula oxygen Cardiovascular status: stable and blood pressure returned to baseline Postop Assessment: no apparent nausea or vomiting Anesthetic complications: no    Last Vitals:  Vitals:   10/16/17 1318 10/16/17 1354  BP: 132/73 (!) 146/86  Pulse: 73 80  Resp: 17 19  Temp: 36.5 C 36.5 C  SpO2: 97% 95%    Last Pain:  Vitals:   10/16/17 1318  TempSrc:   PainSc: 0-No pain                 Tiajuana Amass

## 2017-10-19 NOTE — Op Note (Signed)
NAME:  Ricardo Zuniga, Ricardo Zuniga NO.:  [id number]  MEDICAL RECORD NO.:  [id number]  LOCATION:                                 FACILITY:  PHYSICIAN:  Lockie Pares, M.D.    DATE OF BIRTH:  07/03/1951  DATE OF PROCEDURE: DATE OF DISCHARGE:  10/16/2017                              OPERATIVE REPORT   PREOPERATIVE DIAGNOSIS:  Left carpal tunnel syndrome.  POSTOPERATIVE DIAGNOSIS:  Left carpal tunnel syndrome.  OPERATION:  Left carpal tunnel release.  SURGEON:  Lockie Pares, MD.  ANESTHESIA:  MAC anesthesia, local supplementation, 10 mL of 0.5% Marcaine with lidocaine.  TOURNIQUET TIME:  Set 15 minutes.  DESCRIPTION OF PROCEDURE:  Supine positioning, infiltration of local anesthetic, followed by forearm tourniquet inflated to 250. Curvilinear incision which was made ulnar to the thenar crease.  We dissected down to and through, incise the transverse carpal ligament completely exposing the median nerve, releasing the ligament completely.  The nerve showed moderately severe compression.  Dissected on the ulnar side of the nerve with dissection proximally to include the antebrachial fascia of the wrist and distally to the level of superficial arch.  There were no other anomalies noted.  Wound was irrigated, closed with nylon. Lightly compressive sterile dressing applied.  Taken to recovery room in stable condition.  Tourniquet was released after application of the dressing.     Lockie Pares, M.D.     WDC/MEDQ  D:  10/16/2017  T:  10/17/2017  Job:  [id number]

## 2017-12-17 ENCOUNTER — Other Ambulatory Visit: Payer: Self-pay | Admitting: Neurological Surgery

## 2017-12-28 ENCOUNTER — Encounter (HOSPITAL_COMMUNITY): Payer: Self-pay

## 2017-12-28 ENCOUNTER — Encounter (HOSPITAL_COMMUNITY)
Admission: RE | Admit: 2017-12-28 | Discharge: 2017-12-28 | Disposition: A | Discharge status: Home / Self Care | Payer: Medicare Other | Source: Ambulatory Visit | Attending: Neurological Surgery | Admitting: Neurological Surgery

## 2017-12-28 ENCOUNTER — Other Ambulatory Visit: Payer: Self-pay

## 2017-12-28 DIAGNOSIS — E785 Hyperlipidemia, unspecified: Secondary | ICD-10-CM | POA: Diagnosis not present

## 2017-12-28 DIAGNOSIS — Z01812 Encounter for preprocedural laboratory examination: Secondary | ICD-10-CM | POA: Diagnosis not present

## 2017-12-28 DIAGNOSIS — Z01818 Encounter for other preprocedural examination: Secondary | ICD-10-CM | POA: Insufficient documentation

## 2017-12-28 DIAGNOSIS — Z9049 Acquired absence of other specified parts of digestive tract: Secondary | ICD-10-CM | POA: Insufficient documentation

## 2017-12-28 DIAGNOSIS — Z9841 Cataract extraction status, right eye: Secondary | ICD-10-CM | POA: Diagnosis not present

## 2017-12-28 DIAGNOSIS — M47812 Spondylosis without myelopathy or radiculopathy, cervical region: Secondary | ICD-10-CM | POA: Diagnosis not present

## 2017-12-28 DIAGNOSIS — Z87442 Personal history of urinary calculi: Secondary | ICD-10-CM | POA: Diagnosis not present

## 2017-12-28 DIAGNOSIS — Z7951 Long term (current) use of inhaled steroids: Secondary | ICD-10-CM | POA: Diagnosis not present

## 2017-12-28 DIAGNOSIS — Z9842 Cataract extraction status, left eye: Secondary | ICD-10-CM | POA: Diagnosis not present

## 2017-12-28 DIAGNOSIS — C9241 Acute promyelocytic leukemia, in remission: Secondary | ICD-10-CM | POA: Insufficient documentation

## 2017-12-28 DIAGNOSIS — K219 Gastro-esophageal reflux disease without esophagitis: Secondary | ICD-10-CM | POA: Diagnosis not present

## 2017-12-28 DIAGNOSIS — I2510 Atherosclerotic heart disease of native coronary artery without angina pectoris: Secondary | ICD-10-CM | POA: Insufficient documentation

## 2017-12-28 DIAGNOSIS — I428 Other cardiomyopathies: Secondary | ICD-10-CM | POA: Diagnosis not present

## 2017-12-28 DIAGNOSIS — I5022 Chronic systolic (congestive) heart failure: Secondary | ICD-10-CM | POA: Insufficient documentation

## 2017-12-28 DIAGNOSIS — I252 Old myocardial infarction: Secondary | ICD-10-CM | POA: Insufficient documentation

## 2017-12-28 DIAGNOSIS — Z79899 Other long term (current) drug therapy: Secondary | ICD-10-CM | POA: Insufficient documentation

## 2017-12-28 DIAGNOSIS — E119 Type 2 diabetes mellitus without complications: Secondary | ICD-10-CM | POA: Insufficient documentation

## 2017-12-28 DIAGNOSIS — Z981 Arthrodesis status: Secondary | ICD-10-CM | POA: Insufficient documentation

## 2017-12-28 LAB — SURGICAL PCR SCREEN
MRSA, PCR: NEGATIVE
Staphylococcus aureus: POSITIVE — AB

## 2017-12-28 LAB — CBC
HCT: 45.9 % (ref 39.0–52.0)
Hemoglobin: 16.5 g/dL (ref 13.0–17.0)
MCH: 32 pg (ref 26.0–34.0)
MCHC: 35.9 g/dL (ref 30.0–36.0)
MCV: 89.1 fL (ref 78.0–100.0)
Platelets: 221 10*3/uL (ref 150–400)
RBC: 5.15 MIL/uL (ref 4.22–5.81)
RDW: 12.8 % (ref 11.5–15.5)
WBC: 5.3 10*3/uL (ref 4.0–10.5)

## 2017-12-28 LAB — BASIC METABOLIC PANEL
Anion gap: 12 (ref 5–15)
BUN: 10 mg/dL (ref 6–20)
CO2: 23 mmol/L (ref 22–32)
Calcium: 9 mg/dL (ref 8.9–10.3)
Chloride: 104 mmol/L (ref 101–111)
Creatinine, Ser: 0.75 mg/dL (ref 0.61–1.24)
GFR calc Af Amer: >60 mL/min (ref >60)
GFR calc non Af Amer: >60 mL/min (ref >60)
Glucose, Bld: 183 mg/dL — ABNORMAL HIGH (ref 65–99)
Potassium: 3.8 mmol/L (ref 3.5–5.1)
Sodium: 139 mmol/L (ref 135–145)

## 2017-12-28 LAB — HEMOGLOBIN A1C
Hgb A1c MFr Bld: 6.9 % — ABNORMAL HIGH (ref 4.8–5.6)
Mean Plasma Glucose: 151.33 mg/dL

## 2017-12-28 LAB — GLUCOSE, CAPILLARY: Glucose-Capillary: 188 mg/dL — ABNORMAL HIGH (ref 65–99)

## 2017-12-28 MED ORDER — CHLORHEXIDINE GLUCONATE CLOTH 2 % EX PADS: 6.0000 | MEDICATED_PAD | Freq: Once | CUTANEOUS | Status: DC

## 2017-12-28 NOTE — Progress Notes (Signed)
Anesthesia Chart Review:   Case:  [id number] Date/Time:  01/05/18 1215   Procedure:  Artificial Disc Replacement-Cervical - C3-C4 - C4-C5 (N/A )   Anesthesia type:  General   Pre-op diagnosis:  Spondylosis   Location:  [facility] OR ROOM 20 / [place] OR   Surgeon:  Kristeen Miss, MD      DISCUSSION:  - Pt is a 67 year old male with hx CAD (50% RCA), nonischemic cardiomyopathy (EF 36-64%), chronic systolic CHF, DM, hyperlipidemia, acute promyelocytic leukemia (in remission)  - Anesthesia history includes difficult intubation.  Due to anterior larynx.  Intubated on 1 attempt 07/18/15 with 7.0 ETT using stylette, oral airway and video laryngoscopy. Anesthesia records in [place].   VS: BP (!) 143/75   Pulse 83   Temp 36.7 C   Resp 20   Ht 5\' 9"  (1.753 m)   Wt 195 lb 1.6 oz (88.5 kg)   SpO2 96%   BMI 28.81 kg/m    PROVIDERS: PCP is Glenda Chroman, MD   Patient Care Team: Herminio Commons, MD as Attending Physician (Cardiology). Last office visit 07/29/17; 1 year follow-up recommended. Oncologist is Randa Spike, MD (notes in care everywhere)    LABS: Labs reviewed: Acceptable for surgery. (all labs ordered are listed, but only abnormal results are displayed)  Labs Reviewed  SURGICAL PCR SCREEN - Abnormal; Notable for the following components:      Result Value   Staphylococcus aureus POSITIVE (*)    All other components within normal limits  GLUCOSE, CAPILLARY - Abnormal; Notable for the following components:   Glucose-Capillary 188 (*)    All other components within normal limits  BASIC METABOLIC PANEL - Abnormal; Notable for the following components:   Glucose, Bld 183 (*)    All other components within normal limits  HEMOGLOBIN A1C - Abnormal; Notable for the following components:   Hgb A1c MFr Bld 6.9 (*)    All other components within normal limits  CBC    EKG 07/29/17: Sinus  Rhythm. Diffuse nonspecific T-abnormality. Low voltage with rightward P-axis and rotation -possible  pulmonary disease   CV:  Nuclear stress test 05/29/16:   Blood pressure demonstrated a normal response to exercise.  Defect 1: There is a medium defect of moderate severity present in the basal inferior, mid inferior and apical inferior location.  Findings consistent with possible prior myocardial infarction.  This is an intermediate risk study based on moderate to severely reduced EF.  Echo 05/17/16: - Left ventricle: The cavity size was normal. Wall thickness was increased in a pattern of moderate LVH. Systolic function was moderately reduced. The estimated ejection fraction was in the range of 35% to 40%. Moderate diffuse hypokinesis. Dopplerparameters are consistent with abnormal left ventricularrelaxation (grade 1 diastolic dysfunction). - Aortic valve: There was mild regurgitation. - Aortic root: The aortic root was mildly dilated. - Mitral valve: There was mild regurgitation. - Left atrium: The atrium was moderately dilated.  Cardiac cath 12/12/13:   Severe Nonischemic Cardiomyopathy With No Angiographic CAD (RCA 50-60%), low cardiac output and index by both thermodilution and Fick.  Severely elevated LVEDP despite low right-sided filling pressures and low systemic pressures.  10-13 mmHg Gradient from LVEDP suggesting Mitral Valve Disease    Past Medical History:  Diagnosis Date  . Anal fissure   . APML (acute promyelocytic leukemia) in remission ([place])    completed treatment 04/2013  . Coronary artery disease   . Difficult intubation   . DM type 2 (  diabetes mellitus, type 2) ([place])   . Gastroesophageal reflux disease   . Heart murmur   . Hypercholesteremia   . Myocardial infarction [facility])    " silent"  . Nephrolithiasis   . Nonischemic cardiomyopathy ([place])   . Peptic ulcer disease    remote  . Ureteral colic   . Wears glasses   . Wears hearing aid in both ears     Past Surgical History:  Procedure Laterality Date  . ANAL FISSURE REPAIR    . APPENDECTOMY     . CARPAL TUNNEL RELEASE Left 10/16/2017   Procedure: LEFT CARPAL TUNNEL RELEASE;  Surgeon: Earlie Server, MD;  Location: [place];  Service: Orthopedics;  Laterality: Left;  . CATARACT EXTRACTION, BILATERAL    . CERVICAL FUSION    . CHOLECYSTECTOMY    . COLONOSCOPY N/A 06/27/2015   Procedure: COLONOSCOPY;  Surgeon: Rogene Houston, MD;  [facility];  Service: Endoscopy;  Laterality: N/A;  730  . KNEE SURGERY     x3  . LEFT AND RIGHT HEART CATHETERIZATION WITH CORONARY ANGIOGRAM N/A 12/12/2013   Procedure: LEFT AND RIGHT HEART CATHETERIZATION WITH CORONARY ANGIOGRAM;  Surgeon: [name], MD;  Location: [facility] CATH LAB;  Service: Cardiovascular;  Laterality: N/A;  . LUMBAR FUSION    . SHOULDER ACROMIOPLASTY Right 07/18/2015   Procedure: SHOULDER ACROMIOPLASTY;  Surgeon: Earlie Server, MD;  Location: [place];  Service: Orthopedics;  Laterality: Right;  . SHOULDER ARTHROSCOPY Right 07/18/2015   Procedure: Right shoulder arthroscopy with debridement;  Surgeon: Earlie Server, MD;  Location: [place];  Service: Orthopedics;  Laterality: Right;  . SHOULDER SURGERY     x3  . TONSILLECTOMY      MEDICATIONS: . atorvastatin (LIPITOR) 80 MG tablet  . budesonide (RHINOCORT ALLERGY) 32 MCG/ACT nasal spray  . carvedilol (COREG) 3.125 MG tablet  . diazepam (VALIUM) 5 MG tablet  . docusate sodium (COLACE) 100 MG capsule  . furosemide (LASIX) 20 MG tablet  . INVOKANA 300 MG TABS  . losartan (COZAAR) 25 MG tablet  . pantoprazole (PROTONIX) 40 MG tablet  . repaglinide (PRANDIN) 2 MG tablet  . traMADol (ULTRAM) 50 MG tablet   . Chlorhexidine Gluconate Cloth 2 % PADS 6 each   And  . Chlorhexidine Gluconate Cloth 2 % PADS 6 each  . Chlorhexidine Gluconate Cloth 2 % PADS 6 each   And  . Chlorhexidine Gluconate Cloth 2 % PADS 6 each    If no changes, I anticipate pt can proceed with surgery as scheduled.   Willeen Cass, FNP-BC [facility] Short Stay Surgical  Center/Anesthesiology Phone: [phone] 12/28/2017 12:32 PM

## 2017-12-28 NOTE — Pre-Procedure Instructions (Signed)
Ricardo Zuniga  12/28/2017      [facility] [address], [place] [address] Phone: [phone] Fax: [phone]    Your procedure is scheduled on Jan 05, 2018.  Report to [facility] Admitting at 1030 AM.  Call this number if you have problems the morning of surgery:  [phone]   Remember:  No food  Or Drink after midnight. Continue all medications as directed by your physician except follow these medication instructions before surgery below   Take these medicines the morning of surgery with A SIP OF WATER Rhinocort allergy nasal spray-if needed Carvedilol (coreg) Diazepam (valium)-if needed Pantoprazole (protonix)    7 days prior to surgery STOP taking any Aspirin (unless otherwise instructed by your surgeon), Aleve, Naproxen, Ibuprofen, Motrin, Advil, Goody's, BC's, all herbal medications, fish oil, and all vitamins   WHAT DO I DO ABOUT MY DIABETES MEDICATION?  Marland Kitchen Do not take oral diabetes medicines (pills) the morning of surgery invokana or repaglinide (prandin)  Reviewed and Endorsed by [facility] Patient Education Committee, August 2015   How to Manage Your Diabetes Before and After Surgery  Why is it important to control my blood sugar before and after surgery? . Improving blood sugar levels before and after surgery helps healing and can limit problems. . A way of improving blood sugar control is eating a healthy diet by: o  Eating less sugar and carbohydrates o  Increasing activity/exercise o  Talking with your doctor about reaching your blood sugar goals . High blood sugars (greater than 180 mg/dL) can raise your risk of infections and slow your recovery, so you will need to focus on controlling your diabetes during the weeks before surgery. . Make sure that the doctor who takes care of your diabetes knows about your planned surgery including the date and location.  How do I manage my blood sugar  before surgery? . Check your blood sugar at least 4 times a day, starting 2 days before surgery, to make sure that the level is not too high or low. o Check your blood sugar the morning of your surgery when you wake up and every 2 hours until you get to the Short Stay unit. . If your blood sugar is less than 70 mg/dL, you will need to treat for low blood sugar: o Do not take insulin. o Treat a low blood sugar (less than 70 mg/dL) with  cup of clear juice (cranberry or apple), 4 glucose tablets, OR glucose gel. Recheck blood sugar in 15 minutes after treatment (to make sure it is greater than 70 mg/dL). If your blood sugar is not greater than 70 mg/dL on recheck, call [phone] o  for further instructions. . Report your blood sugar to the short stay nurse when you get to Short Stay.  . If you are admitted to the hospital after surgery: o Your blood sugar will be checked by the staff and you will probably be given insulin after surgery (instead of oral diabetes medicines) to make sure you have good blood sugar levels. o The goal for blood sugar control after surgery is 80-180 mg/dL.   Do not wear jewelry.  Do not wear lotions, powders, or colognes, or deodorant.  Men may shave face and neck.  Do not bring valuables to the hospital.  [facility] is not responsible for any belongings or valuables.  Contacts, dentures or bridgework may not be worn  into surgery.  Leave your suitcase in the car.  After surgery it may be brought to your room.  For patients admitted to the hospital, discharge time will be determined by your treatment team.  Patients discharged the day of surgery will not be allowed to drive home.    [place]- Preparing For Surgery  Before surgery, you can play an important role. Because skin is not sterile, your skin needs to be as free of germs as possible. You can reduce the number of germs on your skin by washing with CHG (chlorahexidine gluconate) Soap before surgery.   CHG is an antiseptic cleaner which kills germs and bonds with the skin to continue killing germs even after washing.  Oral Hygiene is also important to reduce your risk of infection.  Remember - BRUSH YOUR TEETH THE MORNING OF SURGERY  Please do not use if you have an allergy to CHG or antibacterial soaps. If your skin becomes reddened/irritated stop using the CHG.  Do not shave (including legs and underarms) for at least 48 hours prior to first CHG shower. It is OK to shave your face.  Please follow these instructions carefully.   1. Shower the NIGHT BEFORE SURGERY and the MORNING OF SURGERY with CHG.   2. If you chose to wash your hair, wash your hair first as usual with your normal shampoo.  3. After you shampoo, rinse your hair and body thoroughly to remove the shampoo.  4. Use CHG as you would any other liquid soap. You can apply CHG directly to the skin and wash gently with a scrungie or a clean washcloth.   5. Apply the CHG Soap to your body ONLY FROM THE NECK DOWN.  Do not use on open wounds or open sores. Avoid contact with your eyes, ears, mouth and genitals (private parts). Wash Face and genitals (private parts)  with your normal soap.  6. Wash thoroughly, paying special attention to the area where your surgery will be performed.  7. Thoroughly rinse your body with warm water from the neck down.  8. DO NOT shower/wash with your normal soap after using and rinsing off the CHG Soap.  9. Pat yourself dry with a CLEAN TOWEL.  10. Wear CLEAN PAJAMAS to bed the night before surgery, wear comfortable clothes the morning of surgery  11. Place CLEAN SHEETS on your bed the night of your first shower and DO NOT SLEEP WITH PETS.  Day of Surgery:  Do not apply any deodorants/lotions.  Please wear clean clothes to the hospital/surgery center.   Remember to brush your teeth.    Please read over the following fact sheets that you were given. Pain Booklet, Coughing and Deep Breathing,  MRSA Information and Surgical Site Infection Prevention

## 2017-12-28 NOTE — Progress Notes (Addendum)
PCP: Jerene Bears, MD  Cardiologist: Kate Sable, MD  EKG: 07/29/17 in EPIC  Stress test: 07/13/12 in EPIC  ECHO:05/17/16 in Epic  Cardiac Cath: 12/12/2013 in EPIC  Chest x-ray: pt denies past year, no recent respiratory infections/complications

## 2018-01-05 ENCOUNTER — Inpatient Hospital Stay (HOSPITAL_COMMUNITY): Payer: Medicare Other

## 2018-01-05 ENCOUNTER — Inpatient Hospital Stay (HOSPITAL_COMMUNITY): Payer: Medicare Other | Admitting: Certified Registered Nurse Anesthetist

## 2018-01-05 ENCOUNTER — Inpatient Hospital Stay (HOSPITAL_COMMUNITY): Payer: Medicare Other | Admitting: Vascular Surgery

## 2018-01-05 ENCOUNTER — Observation Stay (HOSPITAL_COMMUNITY)
Admission: RE | Admit: 2018-01-05 | Discharge: 2018-01-06 | Disposition: A | Discharge status: Home / Self Care | Payer: Medicare Other | Source: Ambulatory Visit | Attending: Neurological Surgery | Admitting: Neurological Surgery

## 2018-01-05 ENCOUNTER — Encounter (HOSPITAL_COMMUNITY): Payer: Self-pay | Admitting: Certified Registered Nurse Anesthetist

## 2018-01-05 ENCOUNTER — Ambulatory Visit (HOSPITAL_COMMUNITY)
Admission: RE | Disposition: A | Discharge status: Home / Self Care | Payer: Self-pay | Source: Ambulatory Visit | Attending: Neurological Surgery

## 2018-01-05 DIAGNOSIS — M4712 Other spondylosis with myelopathy, cervical region: Principal | ICD-10-CM | POA: Insufficient documentation

## 2018-01-05 DIAGNOSIS — Z87891 Personal history of nicotine dependence: Secondary | ICD-10-CM | POA: Insufficient documentation

## 2018-01-05 DIAGNOSIS — Z79899 Other long term (current) drug therapy: Secondary | ICD-10-CM | POA: Diagnosis not present

## 2018-01-05 DIAGNOSIS — I252 Old myocardial infarction: Secondary | ICD-10-CM | POA: Diagnosis not present

## 2018-01-05 DIAGNOSIS — Z419 Encounter for procedure for purposes other than remedying health state, unspecified: Secondary | ICD-10-CM

## 2018-01-05 DIAGNOSIS — C9591 Leukemia, unspecified, in remission: Secondary | ICD-10-CM | POA: Insufficient documentation

## 2018-01-05 DIAGNOSIS — E119 Type 2 diabetes mellitus without complications: Secondary | ICD-10-CM | POA: Diagnosis not present

## 2018-01-05 HISTORY — PX: CERVICAL DISC ARTHROPLASTY: SHX587

## 2018-01-05 LAB — GLUCOSE, CAPILLARY
Glucose-Capillary: 168 mg/dL — ABNORMAL HIGH (ref 65–99)
Glucose-Capillary: 134 mg/dL — ABNORMAL HIGH (ref 65–99)
Glucose-Capillary: 146 mg/dL — ABNORMAL HIGH (ref 65–99)
Glucose-Capillary: 216 mg/dL — ABNORMAL HIGH (ref 65–99)

## 2018-01-05 SURGERY — CERVICAL ANTERIOR DISC ARTHROPLASTY
Anesthesia: General | Site: Spine Cervical

## 2018-01-05 MED ORDER — PROMETHAZINE HCL 25 MG/ML IJ SOLN: 6.2500 mg | INTRAMUSCULAR | Status: DC | PRN

## 2018-01-05 MED ORDER — MEPERIDINE HCL 50 MG/ML IJ SOLN: 6.2500 mg | INTRAMUSCULAR | Status: DC | PRN

## 2018-01-05 MED ORDER — FLEET ENEMA 7-19 GM/118ML RE ENEM: 1.0000 | ENEMA | Freq: Once | RECTAL | Status: DC | PRN

## 2018-01-05 MED ORDER — CEFAZOLIN SODIUM-DEXTROSE 2-4 GM/100ML-% IV SOLN
2.0000 g | Freq: Three times a day (TID) | INTRAVENOUS | Status: AC
  Administered 2018-01-05: 2 g via INTRAVENOUS
  Filled 2018-01-05: qty 100

## 2018-01-05 MED ORDER — BISACODYL 10 MG RE SUPP: 10.0000 mg | Freq: Every day | RECTAL | Status: DC | PRN

## 2018-01-05 MED ORDER — 0.9 % SODIUM CHLORIDE (POUR BTL) OPTIME
TOPICAL | Status: DC | PRN
  Administered 2018-01-05: 1000 mL

## 2018-01-05 MED ORDER — CANAGLIFLOZIN 300 MG PO TABS
300.0000 mg | ORAL_TABLET | Freq: Every day | ORAL | Status: DC
  Filled 2018-01-05: qty 1

## 2018-01-05 MED ORDER — LIDOCAINE-EPINEPHRINE 1 %-1:100000 IJ SOLN
INTRAMUSCULAR | Status: DC | PRN
  Administered 2018-01-05: 4.5 mL

## 2018-01-05 MED ORDER — SODIUM CHLORIDE 0.9 % IV SOLN: 250.0000 mL | INTRAVENOUS | Status: DC

## 2018-01-05 MED ORDER — THROMBIN 5000 UNITS EX SOLR
CUTANEOUS | Status: AC
  Filled 2018-01-05: qty 5000

## 2018-01-05 MED ORDER — GELATIN ABSORBABLE MT POWD
OROMUCOSAL | Status: DC | PRN
  Administered 2018-01-05 (×2): via TOPICAL

## 2018-01-05 MED ORDER — ROCURONIUM BROMIDE 100 MG/10ML IV SOLN
INTRAVENOUS | Status: DC | PRN
  Administered 2018-01-05: 10 mg via INTRAVENOUS
  Administered 2018-01-05: 50 mg via INTRAVENOUS
  Administered 2018-01-05: 5 mg via INTRAVENOUS
  Administered 2018-01-05: 20 mg via INTRAVENOUS

## 2018-01-05 MED ORDER — PROPOFOL 10 MG/ML IV BOLUS
INTRAVENOUS | Status: AC
  Filled 2018-01-05: qty 20

## 2018-01-05 MED ORDER — DOCUSATE SODIUM 100 MG PO CAPS: 200.0000 mg | ORAL_CAPSULE | Freq: Every day | ORAL | Status: DC

## 2018-01-05 MED ORDER — HYDROMORPHONE HCL 2 MG/ML IJ SOLN
0.2500 mg | INTRAMUSCULAR | Status: DC | PRN
  Administered 2018-01-05: 0.5 mg via INTRAVENOUS

## 2018-01-05 MED ORDER — SUGAMMADEX SODIUM 200 MG/2ML IV SOLN
INTRAVENOUS | Status: AC
  Filled 2018-01-05: qty 2

## 2018-01-05 MED ORDER — PHENYLEPHRINE HCL 10 MG/ML IJ SOLN
INTRAMUSCULAR | Status: DC | PRN
  Administered 2018-01-05: 80 ug via INTRAVENOUS

## 2018-01-05 MED ORDER — POLYETHYLENE GLYCOL 3350 17 G PO PACK: 17.0000 g | PACK | Freq: Every day | ORAL | Status: DC | PRN

## 2018-01-05 MED ORDER — CEFAZOLIN SODIUM-DEXTROSE 2-4 GM/100ML-% IV SOLN
INTRAVENOUS | Status: AC
  Filled 2018-01-05: qty 100

## 2018-01-05 MED ORDER — CARVEDILOL 3.125 MG PO TABS: 3.1250 mg | ORAL_TABLET | Freq: Two times a day (BID) | ORAL | Status: DC

## 2018-01-05 MED ORDER — ROCURONIUM BROMIDE 50 MG/5ML IV SOLN
INTRAVENOUS | Status: AC
  Filled 2018-01-05: qty 1

## 2018-01-05 MED ORDER — SENNA 8.6 MG PO TABS
1.0000 | ORAL_TABLET | Freq: Two times a day (BID) | ORAL | Status: DC
  Administered 2018-01-05: 8.6 mg via ORAL
  Filled 2018-01-05: qty 1

## 2018-01-05 MED ORDER — MIDAZOLAM HCL 5 MG/5ML IJ SOLN
INTRAMUSCULAR | Status: DC | PRN
  Administered 2018-01-05: 2 mg via INTRAVENOUS

## 2018-01-05 MED ORDER — ONDANSETRON HCL 4 MG/2ML IJ SOLN
INTRAMUSCULAR | Status: AC
  Filled 2018-01-05: qty 2

## 2018-01-05 MED ORDER — MIDAZOLAM HCL 2 MG/2ML IJ SOLN
INTRAMUSCULAR | Status: AC
  Filled 2018-01-05: qty 2

## 2018-01-05 MED ORDER — ONDANSETRON HCL 4 MG PO TABS: 4.0000 mg | ORAL_TABLET | Freq: Four times a day (QID) | ORAL | Status: DC | PRN

## 2018-01-05 MED ORDER — MENTHOL 3 MG MT LOZG: 1.0000 | LOZENGE | OROMUCOSAL | Status: DC | PRN

## 2018-01-05 MED ORDER — ONDANSETRON HCL 4 MG/2ML IJ SOLN
INTRAMUSCULAR | Status: DC | PRN
  Administered 2018-01-05: 4 mg via INTRAVENOUS

## 2018-01-05 MED ORDER — HYDROCODONE-ACETAMINOPHEN 5-325 MG PO TABS: 2.0000 | ORAL_TABLET | ORAL | Status: DC | PRN

## 2018-01-05 MED ORDER — LACTATED RINGERS IV SOLN
INTRAVENOUS | Status: DC
  Administered 2018-01-05 (×3): via INTRAVENOUS

## 2018-01-05 MED ORDER — PANTOPRAZOLE SODIUM 40 MG PO TBEC: 40.0000 mg | DELAYED_RELEASE_TABLET | Freq: Every day | ORAL | Status: DC

## 2018-01-05 MED ORDER — REPAGLINIDE 2 MG PO TABS
2.0000 mg | ORAL_TABLET | Freq: Two times a day (BID) | ORAL | Status: DC
  Filled 2018-01-05: qty 1

## 2018-01-05 MED ORDER — HYDROMORPHONE HCL 2 MG/ML IJ SOLN
INTRAMUSCULAR | Status: AC
  Filled 2018-01-05: qty 1

## 2018-01-05 MED ORDER — FUROSEMIDE 20 MG PO TABS: 20.0000 mg | ORAL_TABLET | Freq: Every day | ORAL | Status: DC

## 2018-01-05 MED ORDER — PHENYLEPHRINE 40 MCG/ML (10ML) SYRINGE FOR IV PUSH (FOR BLOOD PRESSURE SUPPORT)
PREFILLED_SYRINGE | INTRAVENOUS | Status: AC
  Filled 2018-01-05: qty 10

## 2018-01-05 MED ORDER — ALUM & MAG HYDROXIDE-SIMETH 200-200-20 MG/5ML PO SUSP: 30.0000 mL | Freq: Four times a day (QID) | ORAL | Status: DC | PRN

## 2018-01-05 MED ORDER — METHOCARBAMOL 1000 MG/10ML IJ SOLN
500.0000 mg | Freq: Four times a day (QID) | INTRAVENOUS | Status: DC | PRN
  Filled 2018-01-05: qty 5

## 2018-01-05 MED ORDER — PHENOL 1.4 % MT LIQD: 1.0000 | OROMUCOSAL | Status: DC | PRN

## 2018-01-05 MED ORDER — ACETAMINOPHEN 650 MG RE SUPP: 650.0000 mg | RECTAL | Status: DC | PRN

## 2018-01-05 MED ORDER — BUPIVACAINE HCL (PF) 0.5 % IJ SOLN
INTRAMUSCULAR | Status: AC
  Filled 2018-01-05: qty 30

## 2018-01-05 MED ORDER — DEXTROSE 5 % IV SOLN
INTRAVENOUS | Status: DC | PRN
  Administered 2018-01-05: 25 ug/min via INTRAVENOUS

## 2018-01-05 MED ORDER — EPHEDRINE SULFATE 50 MG/ML IJ SOLN
INTRAMUSCULAR | Status: AC
  Filled 2018-01-05: qty 1

## 2018-01-05 MED ORDER — METHOCARBAMOL 500 MG PO TABS: 500.0000 mg | ORAL_TABLET | Freq: Four times a day (QID) | ORAL | Status: DC | PRN

## 2018-01-05 MED ORDER — LOSARTAN POTASSIUM 25 MG PO TABS
12.5000 mg | ORAL_TABLET | Freq: Every evening | ORAL | Status: DC
  Filled 2018-01-05: qty 0.5

## 2018-01-05 MED ORDER — SODIUM CHLORIDE 0.9% FLUSH: 3.0000 mL | INTRAVENOUS | Status: DC | PRN

## 2018-01-05 MED ORDER — OXYCODONE HCL 5 MG/5ML PO SOLN: 5.0000 mg | Freq: Once | ORAL | Status: DC | PRN

## 2018-01-05 MED ORDER — DOCUSATE SODIUM 100 MG PO CAPS: 100.0000 mg | ORAL_CAPSULE | Freq: Two times a day (BID) | ORAL | Status: DC

## 2018-01-05 MED ORDER — SUGAMMADEX SODIUM 200 MG/2ML IV SOLN
INTRAVENOUS | Status: DC | PRN
  Administered 2018-01-05: 200 mg via INTRAVENOUS

## 2018-01-05 MED ORDER — DIAZEPAM 5 MG PO TABS
5.0000 mg | ORAL_TABLET | Freq: Every day | ORAL | Status: DC | PRN
  Administered 2018-01-05: 5 mg via ORAL
  Filled 2018-01-05 (×2): qty 1

## 2018-01-05 MED ORDER — LIDOCAINE-EPINEPHRINE 1 %-1:100000 IJ SOLN
INTRAMUSCULAR | Status: AC
  Filled 2018-01-05: qty 1

## 2018-01-05 MED ORDER — SODIUM CHLORIDE 0.9 % IV SOLN
INTRAVENOUS | Status: DC | PRN
  Administered 2018-01-05: 16:00:00

## 2018-01-05 MED ORDER — OXYCODONE-ACETAMINOPHEN 5-325 MG PO TABS: 1.0000 | ORAL_TABLET | ORAL | Status: DC | PRN

## 2018-01-05 MED ORDER — MORPHINE SULFATE (PF) 4 MG/ML IV SOLN: 4.0000 mg | INTRAVENOUS | Status: DC | PRN

## 2018-01-05 MED ORDER — FENTANYL CITRATE (PF) 250 MCG/5ML IJ SOLN
INTRAMUSCULAR | Status: AC
  Filled 2018-01-05: qty 5

## 2018-01-05 MED ORDER — FENTANYL CITRATE (PF) 100 MCG/2ML IJ SOLN
INTRAMUSCULAR | Status: DC | PRN
  Administered 2018-01-05: 100 ug via INTRAVENOUS
  Administered 2018-01-05 (×3): 50 ug via INTRAVENOUS

## 2018-01-05 MED ORDER — CEFAZOLIN SODIUM-DEXTROSE 2-4 GM/100ML-% IV SOLN
2.0000 g | INTRAVENOUS | Status: AC
  Administered 2018-01-05: 2 g via INTRAVENOUS

## 2018-01-05 MED ORDER — PROPOFOL 10 MG/ML IV BOLUS
INTRAVENOUS | Status: DC | PRN
  Administered 2018-01-05: 160 mg via INTRAVENOUS

## 2018-01-05 MED ORDER — DEXAMETHASONE SODIUM PHOSPHATE 10 MG/ML IJ SOLN
INTRAMUSCULAR | Status: DC | PRN
  Administered 2018-01-05: 4 mg via INTRAVENOUS

## 2018-01-05 MED ORDER — ATORVASTATIN CALCIUM 20 MG PO TABS: 80.0000 mg | ORAL_TABLET | Freq: Every day | ORAL | Status: DC

## 2018-01-05 MED ORDER — ONDANSETRON HCL 4 MG/2ML IJ SOLN
4.0000 mg | Freq: Four times a day (QID) | INTRAMUSCULAR | Status: DC | PRN
  Administered 2018-01-05: 4 mg via INTRAVENOUS
  Filled 2018-01-05: qty 2

## 2018-01-05 MED ORDER — LIDOCAINE HCL (CARDIAC) PF 100 MG/5ML IV SOSY
PREFILLED_SYRINGE | INTRAVENOUS | Status: DC | PRN
  Administered 2018-01-05: 100 mg via INTRAVENOUS

## 2018-01-05 MED ORDER — BUPIVACAINE HCL (PF) 0.5 % IJ SOLN
INTRAMUSCULAR | Status: DC | PRN
  Administered 2018-01-05: 4.5 mL

## 2018-01-05 MED ORDER — OXYCODONE HCL 5 MG PO TABS: 5.0000 mg | ORAL_TABLET | Freq: Once | ORAL | Status: DC | PRN

## 2018-01-05 MED ORDER — SODIUM CHLORIDE 0.9% FLUSH: 3.0000 mL | Freq: Two times a day (BID) | INTRAVENOUS | Status: DC

## 2018-01-05 MED ORDER — ACETAMINOPHEN 325 MG PO TABS: 650.0000 mg | ORAL_TABLET | ORAL | Status: DC | PRN

## 2018-01-05 MED ORDER — FLUTICASONE PROPIONATE 50 MCG/ACT NA SUSP
1.0000 | Freq: Every day | NASAL | Status: DC
  Filled 2018-01-05: qty 16

## 2018-01-05 SURGICAL SUPPLY — 47 items
ALCOHOL ISOPROPYL (RUBBING) (MISCELLANEOUS) ×2 IMPLANT
BAG DECANTER FOR FLEXI CONT (MISCELLANEOUS) ×2 IMPLANT
BENZOIN TINCTURE PRP APPL 2/3 (GAUZE/BANDAGES/DRESSINGS) ×2 IMPLANT
BIT DRILL NEURO 2X3.1 SFT TUCH (MISCELLANEOUS) ×1 IMPLANT
CANISTER SUCT 3000ML PPV (MISCELLANEOUS) ×2 IMPLANT
DERMABOND ADVANCED (GAUZE/BANDAGES/DRESSINGS) ×1
DERMABOND ADVANCED .7 DNX12 (GAUZE/BANDAGES/DRESSINGS) ×1 IMPLANT
DISC MOBI-C CERVICAL 15X7X15 (Neuro Prosthesis/Implant) ×4 IMPLANT
DRAPE C-ARM 42X72 X-RAY (DRAPES) ×4 IMPLANT
DRAPE C-ARMOR (DRAPES) ×2 IMPLANT
DRAPE LAPAROTOMY 100X72 PEDS (DRAPES) ×2 IMPLANT
DRAPE MICROSCOPE LEICA (MISCELLANEOUS) IMPLANT
DRAPE POUCH INSTRU U-SHP 10X18 (DRAPES) ×2 IMPLANT
DRILL NEURO 2X3.1 SOFT TOUCH (MISCELLANEOUS) ×2
DURAPREP 6ML APPLICATOR 50/CS (WOUND CARE) ×2 IMPLANT
ELECT REM PT RETURN 9FT ADLT (ELECTROSURGICAL) ×2
ELECTRODE REM PT RTRN 9FT ADLT (ELECTROSURGICAL) ×1 IMPLANT
GAUZE SPONGE 4X4 16PLY XRAY LF (GAUZE/BANDAGES/DRESSINGS) IMPLANT
GLOVE BIOGEL PI IND STRL 7.0 (GLOVE) ×3 IMPLANT
GLOVE BIOGEL PI IND STRL 7.5 (GLOVE) ×2 IMPLANT
GLOVE BIOGEL PI IND STRL 8.5 (GLOVE) ×1 IMPLANT
GLOVE BIOGEL PI INDICATOR 7.0 (GLOVE) ×3
GLOVE BIOGEL PI INDICATOR 7.5 (GLOVE) ×2
GLOVE BIOGEL PI INDICATOR 8.5 (GLOVE) ×1
GLOVE ECLIPSE 6.5 STRL STRAW (GLOVE) ×2 IMPLANT
GLOVE ECLIPSE 8.5 STRL (GLOVE) ×2 IMPLANT
GLOVE SURG SS PI 6.5 STRL IVOR (GLOVE) ×6 IMPLANT
GOWN STRL REUS W/ TWL LRG LVL3 (GOWN DISPOSABLE) IMPLANT
GOWN STRL REUS W/ TWL XL LVL3 (GOWN DISPOSABLE) ×2 IMPLANT
GOWN STRL REUS W/TWL 2XL LVL3 (GOWN DISPOSABLE) ×2 IMPLANT
GOWN STRL REUS W/TWL LRG LVL3 (GOWN DISPOSABLE)
GOWN STRL REUS W/TWL XL LVL3 (GOWN DISPOSABLE) ×2
HEMOSTAT POWDER KIT SURGIFOAM (HEMOSTASIS) ×4 IMPLANT
KIT BASIN OR (CUSTOM PROCEDURE TRAY) ×2 IMPLANT
KIT TURNOVER KIT B (KITS) ×2 IMPLANT
NEEDLE HYPO 22GX1.5 SAFETY (NEEDLE) ×2 IMPLANT
NEEDLE SPNL 22GX3.5 QUINCKE BK (NEEDLE) ×2 IMPLANT
NS IRRIG 1000ML POUR BTL (IV SOLUTION) ×2 IMPLANT
PACK LAMINECTOMY NEURO (CUSTOM PROCEDURE TRAY) ×2 IMPLANT
PAD ARMBOARD 7.5X6 YLW CONV (MISCELLANEOUS) ×6 IMPLANT
PATTIES SURGICAL .5 X1 (DISPOSABLE) ×2 IMPLANT
SPONGE INTESTINAL PEANUT (DISPOSABLE) ×2 IMPLANT
SUT VIC AB 4-0 RB1 18 (SUTURE) ×2 IMPLANT
TAPE CLOTH 4X10 WHT NS (GAUZE/BANDAGES/DRESSINGS) ×2 IMPLANT
TOWEL GREEN STERILE (TOWEL DISPOSABLE) ×2 IMPLANT
TOWEL GREEN STERILE FF (TOWEL DISPOSABLE) ×2 IMPLANT
WATER STERILE IRR 1000ML POUR (IV SOLUTION) ×2 IMPLANT

## 2018-01-05 NOTE — Anesthesia Preprocedure Evaluation (Signed)
Anesthesia Evaluation  Patient identified by MRN, date of birth, ID band Patient awake    Reviewed: Allergy & Precautions, NPO status , Patient's Chart, lab work & pertinent test results, reviewed documented beta blocker date and time   History of Anesthesia Complications (+) DIFFICULT AIRWAY  Airway Mallampati: II  TM Distance: >3 FB Neck ROM: Full    Dental   Pulmonary former smoker,    breath sounds clear to auscultation       Cardiovascular hypertension, Pt. on medications and Pt. on home beta blockers + CAD, + Past MI and +CHF  + Valvular Problems/Murmurs  Rhythm:Regular Rate:Normal  Echo 05/17/16: - Left ventricle: The cavity size was normal. Wall thickness was increased in a pattern of moderate LVH. Systolic function was moderately reduced. The estimated ejection fraction was in the range of 35% to 40%. Moderate diffuse hypokinesis. Dopplerparameters are consistent with abnormal left ventricularrelaxation (grade 1 diastolic dysfunction). - Aortic valve: There was mild regurgitation. - Aortic root: The aortic root was mildly dilated. - Mitral valve: There was mild regurgitation. - Left atrium: The atrium was moderately dilated.     Neuro/Psych negative neurological ROS     GI/Hepatic Neg liver ROS, PUD, GERD  ,  Endo/Other  diabetes, Type 2  Renal/GU negative Renal ROS     Musculoskeletal   Abdominal   Peds  Hematology negative hematology ROS (+)   Anesthesia Other Findings   Reproductive/Obstetrics                             Lab Results  Component Value Date   WBC 5.3 12/28/2017   HGB 16.5 12/28/2017   HCT 45.9 12/28/2017   MCV 89.1 12/28/2017   PLT 221 12/28/2017   Lab Results  Component Value Date   CREATININE 0.75 12/28/2017   BUN 10 12/28/2017   NA 139 12/28/2017   K 3.8 12/28/2017   CL 104 12/28/2017   CO2 23 12/28/2017    Anesthesia Physical  Anesthesia  Plan  ASA: III  Anesthesia Plan: General   Post-op Pain Management:    Induction: Intravenous  PONV Risk Score and Plan: 2 and Ondansetron and Midazolam  Airway Management Planned: Oral ETT  Additional Equipment:   Intra-op Plan:   Post-operative Plan: Extubation in OR  Informed Consent: I have reviewed the patients History and Physical, chart, labs and discussed the procedure including the risks, benefits and alternatives for the proposed anesthesia with the patient or authorized representative who has indicated his/her understanding and acceptance.   Dental advisory given  Plan Discussed with: CRNA  Anesthesia Plan Comments:         Anesthesia Quick Evaluation

## 2018-01-05 NOTE — H&P (Signed)
Chief complaint: Neck shoulder and arm pain History of present illness: [name] has been a longstanding patient of ours from years ago when he had an anterior decompression arthrodesis at the level of C5-6. In addition, he has had some back surgery in the past.   He has done remarkably well, but he notes that he has had shoulder problems in the left shoulder. He has been seen and evaluated by Dr. Flossie Dibble, had a carpal tunnel release in the left arm. He notes that he has been keeping some pain in the region of the neck, the back of the head, radiating through the shoulder and into the left arm. He feels that his left arm is weak. Dr. French Ana was contemplating some shoulder surgery, but in advance of this he evaluated his cervical spine on MRI recently performed and this demonstrated that he had high-grade stenosis at the level of C3-4 and C4-5 with evidence of cord compression and foraminal stenosis bilaterally at each level.   Today in the office to further his workup, I obtained a lateral flexion-extension film of the cervical spine and these films reveal that the patient has some good motion between flexion and extension at C3-4 and C4-5. He still has fairly good maintenance of disc height at each of these levels. There was some ventral osteophytosis, but overall the functionality of his neck shows that he has good motion remaining. He has a solid arthrodesis at the level of C5-6 and the alignment in the coronal plane is normal.   The patient notes that he gets some cramping in the lower extremities, in addition to the difficulties that he is having in the left shoulder and arm. His right hand has been intact.  PAST MEDICAL HISTORY: Reveals that he has had some issues with leukemia, which is currently in remission. He has had a silent heart attack in the past and had some markedly decreased cardiac outputs. He has a history of diabetes. He is advised that he should have inpatient surgery only  for significant concerns of his cardiac status.  CURRENT MEDICATIONS: Include Furosemide, Carvedilol, Atorvastatin, Losartan, Invokana, Repaglinide and Colace.  SYSTEMS REVIEW: Notable for sinus problems, heart murmur, leg pain while walking, arm weakness, leg weakness, arm pain, neck and leg pain on a 14-point review sheet.  PHYSICAL EXAMINATION: I note that he has a range of motion that allows him to turn 60 degrees to the right, 45 degrees to the left. He flexes and extends about 50% of normal. There is no tenderness to palpation of supraclavicular fossas and no masses are noted. His motor function reveals good strength in the deltoids, biceps, triceps, grip and intrinsic on the right. On the left side, he has 4/5 deltoid strength, 4/5 wrist extensor strength and grip strength, compared to the right side. His intrinsic function appears normal. Biceps and triceps appear normal.  IMPRESSION: [name] has evidence of spinal cord compression at the levels of C3-4 and C4-5 with evidence of myelopathy. I noted that given these findings that he should undergo anterior cervical decompression at C3-4 and C4-5. Given the good overall functional situation in the cervical spine, I believe that he would be a good candidate for a cervical arthroplasty at the 2 levels. I indicated that typically the surgery can be done on an outpatient basis, but given his medical issues, we would watch him for overnight. The patient does not need to be immobilized in a collar and would be able to move his  neck about after surgery. Recovery typically takes about 4 to 6 weeks and hopefully he will start seeing some improvement in the neck and shoulder discomfort, and also some of the phenomenon that he has been having radiating into his legs will ease also.  He is admitted for surgery today

## 2018-01-05 NOTE — Anesthesia Procedure Notes (Signed)
Procedure Name: Intubation Date/Time: 01/05/2018 3:07 PM Performed by: Inda Coke, CRNA Pre-anesthesia Checklist: Patient identified, Emergency Drugs available, Suction available and Patient being monitored Patient Re-evaluated:Patient Re-evaluated prior to induction Oxygen Delivery Method: Circle System Utilized Preoxygenation: Pre-oxygenation with 100% oxygen Induction Type: IV induction Ventilation: Mask ventilation without difficulty and Oral airway inserted - appropriate to patient size Laryngoscope Size: Glidescope and 4 Tube type: Oral Tube size: 7.5 mm Number of attempts: 3 Airway Equipment and Method: Stylet and Oral airway Placement Confirmation: ETT inserted through vocal cords under direct vision,  positive ETCO2 and breath sounds checked- equal and bilateral Secured at: 22 cm Tube secured with: Tape Dental Injury: Teeth and Oropharynx as per pre-operative assessment  Difficulty Due To: Difficult Airway- due to anterior larynx Comments: CRNA DL X 1 with MAC 4 blade with grade IV view. Dr. Sabra Heck DL X 2 and attempt to pass bougie unsuccessful. Easy mask with oral airway. CRNA DL X 3 with Glidescope MAC 4 blade with grade I view and successful intubation.

## 2018-01-05 NOTE — Op Note (Signed)
Date of surgery: 01/05/2018 Preoperative diagnosis: Spondylosis with myelopathy C3-4 and C4-5 Postoperative diagnosis: Same Procedure: Anterior cervical decompression C3-4 C4-5 arthroplasty with Mobi C Surgeon: Kristeen Miss First assistant: Ashok Pall MD Anesthesia: General endotracheal Indications: Ricardo Zuniga is a 67 year old individuals had neck shoulder and arm pain with weakness on the left side.  He had a previous anterior decompression arthrodesis at the C5-C6 level done years ago.  Because of the increasing symptoms he was evaluated orthopedically and is felt that his shoulder joints were stable the problem was mostly from the cervical spine.  An MRI demonstrated presence of cord compression at C3-4 and see 4 5 with severe foraminal stenosis bilaterally.  Procedure: Patient was brought to the operating room supine on the stretcher.  After the smooth induction of general endotracheal anesthesia he is placed in a donut to hold his head in the upright position the neck was prepped with alcohol DuraPrep and draped in a sterile fashion.  A transverse incision was created on the left side of the neck and carried down through the platysma the plane between the sternocleidomastoid and strap muscles was dissected bluntly until the prevertebral space was reached.  The first identifiable disc space was then of C4-5 verified on a radiograph.  The old plate was identified at C5-6 and this was removed the disc space was then entered after removing large ventral osteophytes from C4-C5.  The disc itself was markedly degenerated and there was significant posterior osteophytosis from the inferior marginal body of C4.  This was cleared away.  The common dural tube was identified and then decompressed out to either foramen.  Epidural venous bleeding was controlled with bipolar cautery and some small pledgets of Gelfoam soaked thrombin.  These were later irrigated away once discectomy was completed a 15 x 15 mm x 5  mm tall low BC arthroplasty device was inserted under fluoroscopic guidance.  After he was checked in the AP and lateral projections the positioning device was removed.  Attention was then turned to C3-C4 were similar process was carried out again noting significant osteophytosis from the inferior margin of the body of C3.  Once this was decompressed again a 15 mm x 15 mm x 5 mm tall low BC was placed into the interspace.  Hemostasis was achieved and all the soft tissues and one adequate control was obtained the platysma was closed with 3-0 Vicryl interrupted fashion of 4-0 Vicryl was used in the subarticular skin patient tolerated procedure well is returned to recovery room stable condition blood loss was estimated at 100 cc.

## 2018-01-05 NOTE — Transfer of Care (Signed)
Immediate Anesthesia Transfer of Care Note  Patient: Ricardo Zuniga  Procedure(s) Performed: Artificial Cervical Disc ReplacementCervical Three-Four, Cervical Four-Five (N/A Spine Cervical)  Patient Location: PACU  Anesthesia Type:General  Level of Consciousness: awake and drowsy  Airway & Oxygen Therapy: Patient Spontanous Breathing and Patient connected to nasal cannula oxygen  Post-op Assessment: Report given to RN, Post -op Vital signs reviewed and stable and Patient moving all extremities X 4  Post vital signs: Reviewed and stable  Last Vitals:  Vitals Value Taken Time  BP 130/68 01/05/2018  5:35 PM  Temp 36.8 C 01/05/2018  5:35 PM  Pulse 84 01/05/2018  5:36 PM  Resp 15 01/05/2018  5:36 PM  SpO2 93 % 01/05/2018  5:36 PM  Vitals shown include unvalidated device data.  Last Pain:  Vitals:   01/05/18 1735  TempSrc:   PainSc: (P) 0-No pain      Patients Stated Pain Goal: 2 (08/81/10 3159)  Complications: No apparent anesthesia complications

## 2018-01-06 ENCOUNTER — Encounter (HOSPITAL_COMMUNITY): Payer: Self-pay | Admitting: Neurological Surgery

## 2018-01-06 DIAGNOSIS — M4712 Other spondylosis with myelopathy, cervical region: Secondary | ICD-10-CM | POA: Diagnosis not present

## 2018-01-06 LAB — GLUCOSE, CAPILLARY: Glucose-Capillary: 132 mg/dL — ABNORMAL HIGH (ref 65–99)

## 2018-01-06 MED ORDER — DIAZEPAM 5 MG PO TABS
5.0000 mg | ORAL_TABLET | Freq: Four times a day (QID) | ORAL | Status: DC | PRN
  Administered 2018-01-06 (×2): 5 mg via ORAL
  Filled 2018-01-06: qty 1

## 2018-01-06 MED ORDER — DIAZEPAM 5 MG PO TABS: 5.0000 mg | ORAL_TABLET | Freq: Four times a day (QID) | ORAL | 0 refills | Status: DC | PRN

## 2018-01-06 MED FILL — Thrombin For Soln 5000 Unit: CUTANEOUS | Qty: 5000 | Status: AC

## 2018-01-06 NOTE — Progress Notes (Signed)
Patient ID: Ricardo Zuniga, male   DOB: [birth year], 67 y.o.   MRN: [id number] Vital signs are stable Incision is clean and dry Motor function appears stable Ready for discharge

## 2018-01-06 NOTE — Evaluation (Signed)
Occupational Therapy Evaluation Patient Details Name: Ricardo Zuniga MRN: [id number] DOB: Mar 16, 1951 Today's Date: 01/06/2018    History of Present Illness Pt is a 67 y/o male who presents s/p C3-C5 anterior disc arthroplasty on 01/05/18. PMH significant for nonischemic cardiomyopathy, MI, DMII, CAD, leukemia in remission, R shoulder surgery x3, knee surgery x3.   Clinical Impression   Patient evaluated by Occupational Therapy with no further acute OT needs identified. Pt able to complete all ADL and ADL transfers with modified independence adhering to cervical precautions today. He verbalized log roll technique but was up in chair on my arrival. Pt reports that he will have 24 hour assistance at home from his wife. Educated concerning cervical precautions and home set-up modifications as well as compensatory ADL strategies and he demonstrates understanding of all education topics. All education has been completed and the patient has no further questions. See below for any follow-up Occupational Therapy or equipment needs. OT to sign off. Thank you for referral.      Follow Up Recommendations  No OT follow up;Supervision - Intermittent    Equipment Recommendations  None recommended by OT    Recommendations for Other Services       Precautions / Restrictions Precautions Precautions: Fall;Cervical Precaution Booklet Issued: Yes (comment) Precaution Comments: Reviewed cervical precautions during ADL participation.  Required Braces or Orthoses: ("No brace needed" order) Restrictions Weight Bearing Restrictions: No      Mobility Bed Mobility Overal bed mobility: Modified Independent             General bed mobility comments: OOB in recliner on my arrival.   Transfers Overall transfer level: Modified independent Equipment used: None Transfers: Sit to/from Stand           General transfer comment: Pt demonstrating good stability and modified independence.     Balance  Overall balance assessment: Mild deficits observed, not formally tested                                         ADL either performed or assessed with clinical judgement   ADL Overall ADL's : Modified independent                                       General ADL Comments: Able to complete ADL and ADL transfers with modified independence adhering to cervical precautions. Educated concerning compensatory strategies for standing grooming tasks as well as dressing and bathing tasks to maximize safety.      Vision Baseline Vision/History: Wears glasses Wears Glasses: Reading only Patient Visual Report: No change from baseline Vision Assessment?: No apparent visual deficits     Perception     Praxis      Pertinent Vitals/Pain Pain Assessment: Faces Faces Pain Scale: Hurts a little bit Pain Location: Anterior cervical region where incision site is Pain Descriptors / Indicators: Operative site guarding Pain Intervention(s): Monitored during session     Hand Dominance Right   Extremity/Trunk Assessment Upper Extremity Assessment Upper Extremity Assessment: Generalized weakness(reports improvement from prior to surgery)   Lower Extremity Assessment Lower Extremity Assessment: Overall WFL for tasks assessed   Cervical / Trunk Assessment Cervical / Trunk Assessment: Other exceptions Cervical / Trunk Exceptions: s/p cervical surgery   Communication Communication Communication: No difficulties   Cognition Arousal/Alertness: Awake/alert Behavior During  Therapy: WFL for tasks assessed/performed Overall Cognitive Status: Within Functional Limits for tasks assessed                                     General Comments  Pt reports wife will be able to assist him at home. She is a Marine scientist.     Exercises     Shoulder Instructions      Home Living Family/patient expects to be discharged to:: Private residence Living Arrangements:  Spouse/significant other Available Help at Discharge: Family;Available 24 hours/day Type of Home: House Home Access: Stairs to enter [employer] of Steps: 1 Entrance Stairs-Rails: None Home Layout: Two level;Able to live on main level with bedroom/bathroom Alternate Level Stairs-Number of Steps: 10   Bathroom Shower/Tub: Tub only;Walk-in shower   Bathroom Toilet: Handicapped height     Home Equipment: None          Prior Functioning/Environment Level of Independence: Independent                 OT Problem List: Decreased strength;Decreased range of motion;Decreased activity tolerance;Impaired balance (sitting and/or standing);Decreased safety awareness;Decreased knowledge of use of DME or AE;Decreased knowledge of precautions;Pain      OT Treatment/Interventions:      OT Goals(Current goals can be found in the care plan section) Acute Rehab OT Goals Patient Stated Goal: To go home today.  OT Goal Formulation: With patient  OT Frequency:     Barriers to D/C:            Co-evaluation              AM-PAC PT "6 Clicks" Daily Activity     Outcome Measure Help from another person eating meals?: None Help from another person taking care of personal grooming?: None Help from another person toileting, which includes using toliet, bedpan, or urinal?: None Help from another person bathing (including washing, rinsing, drying)?: None Help from another person to put on and taking off regular upper body clothing?: None Help from another person to put on and taking off regular lower body clothing?: None 6 Click Score: 24   End of Session Nurse Communication: Mobility status  Activity Tolerance: Patient tolerated treatment well Patient left: in bed;with call bell/phone within reach(seated at EOB)  OT Visit Diagnosis: Other abnormalities of gait and mobility (R26.89)                Time: 2426-8341 OT Time Calculation (min): 17 min Charges:  OT General  Charges $OT Visit: 1 Visit OT Evaluation $OT Eval Low Complexity: 1 Low G-Codes:     Norman Herrlich, MS OTR/L  Pager: [place] A Nelva Hauk 01/06/2018, 10:59 AM

## 2018-01-06 NOTE — Evaluation (Signed)
Physical Therapy Evaluation and Discharge Patient Details Name: Ricardo Zuniga MRN: [id number] DOB: [birth year] Today's Date: 01/06/2018   History of Present Illness  Pt is a 67 y/o male who presents s/p C3-C5 anterior disc arthroplasty on 01/05/18. PMH significant for nonischemic cardiomyopathy, MI, DMII, CAD, leukemia in remission, R shoulder surgery x3, knee surgery x3.  Clinical Impression  Patient evaluated by Physical Therapy with no further acute PT needs identified. All education has been completed and the patient has no further questions. At the time of PT eval pt was able to perform transfers and ambulation with gross modified independence and no AD. Pt was educated on precautions, activity progression, and car transfer. See below for any follow-up Physical Therapy or equipment needs. PT is signing off. Thank you for this referral.     Follow Up Recommendations No PT follow up;Supervision for mobility/OOB    Equipment Recommendations  None recommended by PT    Recommendations for Other Services       Precautions / Restrictions Precautions Precautions: Fall;Cervical Precaution Booklet Issued: Yes (comment) Precaution Comments: Reviewed handout with pt and he was cued for precautions during functional mobility Required Braces or Orthoses: ("No brace needed" order) Restrictions Weight Bearing Restrictions: No      Mobility  Bed Mobility Overal bed mobility: Modified Independent             General bed mobility comments: Reviewed log roll technique and pt able to perform without difficulty, however pt continually sitting straight up in the bed insteady of utilizing log roll.   Transfers Overall transfer level: Modified independent Equipment used: None Transfers: Sit to/from Stand           General transfer comment: No assist required; no unsteadiness noted.   Ambulation/Gait Ambulation/Gait assistance: Modified independent (Device/Increase time) Ambulation  Distance (Feet): 250 Feet Assistive device: None Gait Pattern/deviations: Step-through pattern;Decreased stride length;Trunk flexed Gait velocity: Decreased Gait velocity interpretation: 1.31 - 2.62 ft/sec, indicative of limited community ambulator General Gait Details: VC's for improved posture and general safety while ambulating. Pt was encouraged to turn whole body instead of just at neck to look out the window or to turn and look at therapist. No overt LOB noted.   Stairs            Wheelchair Mobility    Modified Rankin (Stroke Patients Only)       Balance Overall balance assessment: Mild deficits observed, not formally tested                                           Pertinent Vitals/Pain Pain Assessment: Faces Faces Pain Scale: Hurts a little bit Pain Location: Anterior cervical region where incision site is Pain Descriptors / Indicators: Operative site guarding Pain Intervention(s): Monitored during session    Home Living Family/patient expects to be discharged to:: Private residence Living Arrangements: Spouse/significant other Available Help at Discharge: Family;Available 24 hours/day Type of Home: House Home Access: Stairs to enter Entrance Stairs-Rails: None Entrance Stairs-Number of Steps: 1 Home Layout: Two level;Able to live on main level with bedroom/bathroom Home Equipment: None      Prior Function Level of Independence: Independent               Hand Dominance   Dominant Hand: Right    Extremity/Trunk Assessment   Upper Extremity Assessment Upper Extremity Assessment: Generalized weakness(Reports improvement from prior  to surgery)    Lower Extremity Assessment Lower Extremity Assessment: Overall WFL for tasks assessed    Cervical / Trunk Assessment Cervical / Trunk Assessment: Other exceptions Cervical / Trunk Exceptions: s/p cervical surgery  Communication   Communication: No difficulties  Cognition  Arousal/Alertness: Awake/alert Behavior During Therapy: WFL for tasks assessed/performed Overall Cognitive Status: Within Functional Limits for tasks assessed                                        General Comments      Exercises     Assessment/Plan    PT Assessment Patient needs continued PT services  PT Problem List         PT Treatment Interventions      PT Goals (Current goals can be found in the Care Plan section)  Acute Rehab PT Goals Patient Stated Goal: home today PT Goal Formulation: All assessment and education complete, DC therapy    Frequency     Barriers to discharge        Co-evaluation               AM-PAC PT "6 Clicks" Daily Activity  Outcome Measure Difficulty turning over in bed (including adjusting bedclothes, sheets and blankets)?: None Difficulty moving from lying on back to sitting on the side of the bed? : None Difficulty sitting down on and standing up from a chair with arms (e.g., wheelchair, bedside commode, etc,.)?: None Help needed moving to and from a bed to chair (including a wheelchair)?: None Help needed walking in hospital room?: None Help needed climbing 3-5 steps with a railing? : A Little 6 Click Score: 23    End of Session Equipment Utilized During Treatment: Gait belt Activity Tolerance: Patient tolerated treatment well Patient left: with call bell/phone within reach(Sitting EOB) Nurse Communication: Mobility status PT Visit Diagnosis: Unsteadiness on feet (R26.81);Pain Pain - part of body: (neck at incision site)    Time: 2395-3202 PT Time Calculation (min) (ACUTE ONLY): 19 min   Charges:   PT Evaluation $PT Eval Moderate Complexity: 1 Mod     PT G Codes:        Rolinda Roan, PT, DPT Acute Rehabilitation Services Pager: [phone]   Thelma Comp 01/06/2018, 10:37 AM

## 2018-01-06 NOTE — Anesthesia Postprocedure Evaluation (Signed)
Anesthesia Post Note  Patient: Ricardo Zuniga  Procedure(s) Performed: Artificial Cervical Disc ReplacementCervical Three-Four, Cervical Four-Five (N/A Spine Cervical)     Patient location during evaluation: PACU Anesthesia Type: General Level of consciousness: awake and alert Pain management: pain level controlled Vital Signs Assessment: post-procedure vital signs reviewed and stable Respiratory status: spontaneous breathing, nonlabored ventilation and respiratory function stable Cardiovascular status: blood pressure returned to baseline and stable Postop Assessment: no apparent nausea or vomiting Anesthetic complications: no    Last Vitals:  Vitals:   01/06/18 0333 01/06/18 0701  BP: 133/63 130/79  Pulse: 82 89  Resp: 18 16  Temp: 36.7 C 36.6 C  SpO2: 97% 98%    Last Pain:  Vitals:   01/06/18 0701  TempSrc: Oral  PainSc:                  Lynda Rainwater

## 2018-01-06 NOTE — Discharge Summary (Signed)
Physician Discharge Summary  Patient ID: Ricardo Zuniga MRN: [id number] DOB/AGE: April 27, 1951 67 y.o.  Admit date: 01/05/2018 Discharge date: 01/06/2018  Admission Diagnoses: Cervical spondylosis with myelopathy C3-4 C4-5  Discharge Diagnoses: Vehicle spondylosis with myelopathy C3-4 C4-5 Active Problems:   Cervical spondylosis with myelopathy   Discharged Condition: good  Hospital Course: Patient was admitted to undergo a two-level anterior decompression and arthroplasty at C3-4 and C4-5 tolerated surgery well.  Consults: None  Significant Diagnostic Studies: None  Treatments: surgery: Anterior cervical decompression C3-4 and C4-5 arthroplasty with Mobi C  Discharge Exam: Blood pressure 130/79, pulse 89, temperature 97.9 F (36.6 C), temperature source Oral, resp. rate 16, height 5\' 9"  (1.753 m), weight 88.5 kg (195 lb 1.6 oz), SpO2 98 %. Motor function is intact incision is clean and dry Station and gait are normal  Disposition: Discharge disposition: 01-Home or Self Care       Discharge Instructions    Diet - low sodium heart healthy   Complete by:  As directed    Incentive spirometry RT   Complete by:  As directed    Increase activity slowly   Complete by:  As directed      Allergies as of 01/06/2018      Reactions   Lisinopril Cough   Lisinopril-hydrochlorothiazide Cough   Pseudoephedrine Other (See Comments)   Delirium      Medication List    TAKE these medications   atorvastatin 80 MG tablet Commonly known as:  LIPITOR Take 80 mg by mouth every evening.   carvedilol 3.125 MG tablet Commonly known as:  COREG Take 1 tablet (3.125 mg total) by mouth 2 (two) times daily.   diazepam 5 MG tablet Commonly known as:  VALIUM Take 5 mg by mouth daily as needed (for vertigo). What changed:  Another medication with the same name was added. Make sure you understand how and when to take each.   diazepam 5 MG tablet Commonly known as:  VALIUM Take 1 tablet  (5 mg total) by mouth every 6 (six) hours as needed for muscle spasms. What changed:  You were already taking a medication with the same name, and this prescription was added. Make sure you understand how and when to take each.   docusate sodium 100 MG capsule Commonly known as:  COLACE Take 200 mg by mouth at bedtime.   furosemide 20 MG tablet Commonly known as:  LASIX TAKE ONE TABLET BY MOUTH ONCE DAILY AND AS DIRECTED What changed:    how much to take  how to take this  when to take this  additional instructions   INVOKANA 300 MG Tabs tablet Generic drug:  canagliflozin Take 300 mg by mouth daily.   losartan 25 MG tablet Commonly known as:  COZAAR Take 0.5 tablets (12.5 mg total) by mouth every evening.   pantoprazole 40 MG tablet Commonly known as:  PROTONIX Take 40 mg by mouth daily.   repaglinide 2 MG tablet Commonly known as:  PRANDIN Take 2 mg by mouth 2 (two) times daily before a meal.   RHINOCORT ALLERGY 32 MCG/ACT nasal spray Generic drug:  budesonide Place 1 spray into both nostrils daily as needed for allergies.   traMADol 50 MG tablet Commonly known as:  ULTRAM 1-2 q 4-6 prn pain        Signed: Earleen Newport 01/06/2018, 8:45 AM

## 2018-01-06 NOTE — Progress Notes (Signed)
Pt doing well. Pt and wife given D/C instructions with Rx, verbal understanding was provided. Pt's incision is open to air and has no sign of infection. Pt's IV was removed prior to D/C. Pt D/C'd home via walking @ 1100 per MD order. Pt is stable @ D/C and has no other needs at this time. Holli Humbles, RN

## 2018-07-22 ENCOUNTER — Other Ambulatory Visit: Payer: Self-pay | Admitting: Cardiovascular Disease

## 2018-09-01 ENCOUNTER — Ambulatory Visit: Payer: Medicare Other | Admitting: Cardiovascular Disease

## 2018-09-01 ENCOUNTER — Telehealth: Payer: Self-pay | Admitting: Cardiovascular Disease

## 2018-09-01 ENCOUNTER — Encounter: Payer: Self-pay | Admitting: *Deleted

## 2018-09-01 ENCOUNTER — Encounter: Payer: Self-pay | Admitting: Cardiovascular Disease

## 2018-09-01 VITALS — BP 130/82 | HR 103 | Ht 69.0 in | Wt 194.0 lb

## 2018-09-01 DIAGNOSIS — I428 Other cardiomyopathies: Secondary | ICD-10-CM

## 2018-09-01 DIAGNOSIS — I10 Essential (primary) hypertension: Secondary | ICD-10-CM | POA: Diagnosis not present

## 2018-09-01 DIAGNOSIS — E785 Hyperlipidemia, unspecified: Secondary | ICD-10-CM

## 2018-09-01 DIAGNOSIS — R5383 Other fatigue: Secondary | ICD-10-CM

## 2018-09-01 DIAGNOSIS — R0609 Other forms of dyspnea: Secondary | ICD-10-CM | POA: Diagnosis not present

## 2018-09-01 DIAGNOSIS — R0600 Dyspnea, unspecified: Secondary | ICD-10-CM

## 2018-09-01 DIAGNOSIS — I38 Endocarditis, valve unspecified: Secondary | ICD-10-CM

## 2018-09-01 DIAGNOSIS — I25118 Atherosclerotic heart disease of native coronary artery with other forms of angina pectoris: Secondary | ICD-10-CM

## 2018-09-01 DIAGNOSIS — I5022 Chronic systolic (congestive) heart failure: Secondary | ICD-10-CM

## 2018-09-01 NOTE — Telephone Encounter (Signed)
Pre-cert Verification for the following procedure   Echo scheduled 09-02-2018 at [facility] office

## 2018-09-01 NOTE — Progress Notes (Addendum)
SUBJECTIVE: The patient presents for annual follow-up.  He previously underwent a nuclear stress test which demonstrated a defect in the inferior wall suggestive of possible prior myocardial infarction. There was no ischemia.  He has a h/o nonischemic cardiomyopathy and chronic systolic heart failure.  Echo 05/17/16 moderately reduced LV systolic function, EF 16-10% (45-50% 05/24/14), grade 1 diastolic dysfunction, and mild aortic and mitral regurgitation.  ECG performed in the office today which I ordered and personally interpreted demonstrates normal sinus rhythm with a PVC and nonspecific T wave abnormalities.  He is here with his wife.  He apparently was diagnosed with a skin infection on his nose in November.  He was started on Bactrim and then developed abdominal discomfort and bloating.  He has been struggling with right shoulder pain and also has some carpal tunnel syndrome and is right wrist.  He told me that his liver enzymes were elevated but upon most recent testing they have normalized.  He also describes progressive exertional dyspnea with routine activities such as making the bed.  This has gotten worse over the last several months.  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  The patient said most recent labs indicate he is not anemic.      Review of Systems: As per "subjective", otherwise negative.  Allergies  Allergen Reactions  . Lisinopril Cough  . Lisinopril-Hydrochlorothiazide Cough    Current Outpatient Medications  Medication Sig Dispense Refill  . atorvastatin (LIPITOR) 80 MG tablet Take 80 mg by mouth every evening.     . budesonide (RHINOCORT ALLERGY) 32 MCG/ACT nasal spray Place 1 spray into both nostrils daily as needed for allergies.     . carvedilol (COREG) 3.125 MG tablet Take 1 tablet (3.125 mg total) by mouth 2 (two) times daily. 60 tablet 6  . diazepam (VALIUM) 5 MG  tablet Take 5 mg by mouth daily as needed (for vertigo).     . diazepam (VALIUM) 5 MG tablet Take 1 tablet (5 mg total) by mouth every 6 (six) hours as needed for muscle spasms. 30 tablet 0  . docusate sodium (COLACE) 100 MG capsule Take 200 mg by mouth at bedtime.    . furosemide (LASIX) 20 MG tablet TAKE 1 TABLET BY MOUTH ONCE DAILY AND  AS  DIRECTED 90 tablet 3  . INVOKANA 300 MG TABS Take 300 mg by mouth daily.     Marland Kitchen losartan (COZAAR) 25 MG tablet Take 0.5 tablets (12.5 mg total) by mouth every evening. 15 tablet 6  . pantoprazole (PROTONIX) 40 MG tablet Take 40 mg by mouth daily.     . repaglinide (PRANDIN) 2 MG tablet Take 2 mg by mouth 2 (two) times daily before a meal.     . traMADol (ULTRAM) 50 MG tablet 1-2 q 4-6 prn pain 30 tablet 0   No current facility-administered medications for this visit.     Past Medical History:  Diagnosis Date  . Anal fissure   . APML (acute promyelocytic leukemia) in remission ([place])    completed treatment 04/2013  . Coronary artery disease   . Difficult intubation   . DM type 2 (diabetes mellitus, type 2) ([place])   . Gastroesophageal reflux disease   . Heart murmur   . Hypercholesteremia   . Myocardial infarction [facility])    " silent"  . Nephrolithiasis   . Nonischemic cardiomyopathy ([place])   . Peptic ulcer disease  remote  . Ureteral colic   . Wears glasses   . Wears hearing aid in both ears     Past Surgical History:  Procedure Laterality Date  . ANAL FISSURE REPAIR    . APPENDECTOMY    . CARPAL TUNNEL RELEASE Left 10/16/2017   Procedure: LEFT CARPAL TUNNEL RELEASE;  Surgeon: Earlie Server, MD;  Location: [place];  Service: Orthopedics;  Laterality: Left;  . CATARACT EXTRACTION, BILATERAL    . CERVICAL DISC ARTHROPLASTY N/A 01/05/2018   Procedure: Artificial Cervical Disc ReplacementCervical Three-Four, Cervical Four-Five;  Surgeon: Kristeen Miss, MD;  Location: [place];  Service: Neurosurgery;  Laterality: N/A;  . CERVICAL FUSION    .  CHOLECYSTECTOMY    . COLONOSCOPY N/A 06/27/2015   Procedure: COLONOSCOPY;  Surgeon: Rogene Houston, MD;  [facility];  Service: Endoscopy;  Laterality: N/A;  730  . KNEE SURGERY     x3  . LEFT AND RIGHT HEART CATHETERIZATION WITH CORONARY ANGIOGRAM N/A 12/12/2013   Procedure: LEFT AND RIGHT HEART CATHETERIZATION WITH CORONARY ANGIOGRAM;  Surgeon: [name], MD;  Location: [facility] CATH LAB;  Service: Cardiovascular;  Laterality: N/A;  . LUMBAR FUSION    . SHOULDER ACROMIOPLASTY Right 07/18/2015   Procedure: SHOULDER ACROMIOPLASTY;  Surgeon: Earlie Server, MD;  Location: [place];  Service: Orthopedics;  Laterality: Right;  . SHOULDER ARTHROSCOPY Right 07/18/2015   Procedure: Right shoulder arthroscopy with debridement;  Surgeon: Earlie Server, MD;  Location: [place];  Service: Orthopedics;  Laterality: Right;  . SHOULDER SURGERY     x3  . TONSILLECTOMY      Social History   Socioeconomic History  . Marital status: Married    Spouse name: Not on file  . Number of children: Not on file  . Years of education: Not on file  . Highest education level: Not on file  Occupational History  . Occupation: RETIRED    Comment: [employer]  . Occupation: SECURITY GUARD  Social Needs  . Financial resource strain: Not on file  . Food insecurity:    Worry: Not on file    Inability: Not on file  . Transportation needs:    Medical: Not on file    Non-medical: Not on file  Tobacco Use  . Smoking status: Former Smoker    Packs/day: 2.00    Years: 17.00    Pack years: 34.00    Types: Cigarettes    Start date: 02/17/1968    Last attempt to quit: 02/28/1986    Years since quitting: 32.5  . Smokeless tobacco: Never Used  Substance and Sexual Activity  . Alcohol use: No    Alcohol/week: 0.0 standard drinks  . Drug use: No  . Sexual activity: Yes  Lifestyle  . Physical activity:    Days per week: Not on file    Minutes per session: Not on  file  . Stress: Not on file  Relationships  . Social connections:    Talks on phone: Not on file    Gets together: Not on file    Attends religious service: Not on file    Active member of club or organization: Not on file    Attends meetings of clubs or organizations: Not on file    Relationship status: Not on file  . Intimate partner violence:    Fear of current or ex partner: Not on file    Emotionally abused: Not on file    Physically abused: Not  on file    Forced sexual activity: Not on file  Other Topics Concern  . Not on file  Social History Narrative  . Not on file     Vitals:   09/01/18 1517  BP: 130/82  Pulse: (!) 103  SpO2: 97%  Weight: 194 lb (88 kg)  Height: 5\' 9"  (1.753 m)    Wt Readings from Last 3 Encounters:  09/01/18 194 lb (88 kg)  01/05/18 195 lb 1.6 oz (88.5 kg)  12/28/17 195 lb 1.6 oz (88.5 kg)     PHYSICAL EXAM General: NAD HEENT: Normal. Neck: No JVD, no thyromegaly. Lungs: Clear to auscultation bilaterally with normal respiratory effort. CV: Regular rate and rhythm, normal S1/S2, no S3/S4, no murmur. No pretibial or periankle edema.  No carotid bruit.   Abdomen: Soft, nontender, no distention.  Neurologic: Alert and oriented.  Psych: Normal affect. Skin: Normal. Musculoskeletal: No gross deformities.    ECG: Reviewed above under Subjective   Labs: Lab Results  Component Value Date/Time   K 3.8 12/28/2017 10:23 AM   BUN 10 12/28/2017 10:23 AM   CREATININE 0.75 12/28/2017 10:23 AM   CREATININE 0.82 08/30/2012 10:16 AM   ALT 22 12/09/2013 07:41 AM   TSH 1.113 05/16/2016 04:15 PM   TSH 0.692 08/16/2012 03:20 PM   HGB 16.5 12/28/2017 10:23 AM   HGB 13.1 09/03/2012 08:41 AM     Lipids: Lab Results  Component Value Date/Time   LDLCALC 163 (H) 12/11/2013 07:23 AM   CHOL 223 (H) 12/11/2013 07:23 AM   TRIG 122 12/11/2013 07:23 AM   HDL 36 (L) 12/11/2013 07:23 AM       ASSESSMENT AND PLAN:  1. Nonischemic  cardiomyopathy/chronic systolic heart failure: He has had progressive exertional dyspnea.  EF 35-40% on 05/17/16 (45-50% 05/24/14). He is currently compensated and euvolemic. He is intolerant of ACEI's as they caused a dry cough.  Continue low-dose losartan and Coreg. I previously instructed him to take an extra tablet of Lasix should he experience increasing shortness of breath, leg swelling, or weight gain, and to inform me if this does occur. His cardiomyopathy may be related to a viral etiology (prior infections related to Hickman catheter) or a tachycardia mediated etiology. There has been interval declinein LV systolic function as reviewed above. Exercise Myoviewreviewed above (no ischemia) with possible old infarct. I will obtain a follow-up echocardiogram given progressive exertional dyspnea and fatigue to see if there have been interval changes in cardiac structure and function.  I will also obtain a copy of labs from PCP for personal review.  2. Essential hypertension: Well controlled on current regimen. No changes.  3. CAD with 50-60% RCA lesion: He has had progressive exertional dyspnea and fatigue.  I will obtain a Lexiscan Myoview to evaluate for any significant ischemic territories.  I will also obtain a follow-up echocardiogram to evaluate for interval changes in cardiac structure and function.  No changes to medication regimen which include ASA, Coreg, and statin.   4. Valvular heart disease: Stable.  I will obtain a follow-up echocardiogram.  5. Palpitations: Currently stable. Consider event monitoring in the future.    Disposition: Follow up 6 weeks   Kate Sable, M.D., F.A.C.C.  Addendum:  Nuclear stress test was abnormal with moderate peri-infarct ischemia.  I have arranged for coronary angiography. Risks and benefits of cardiac catheterization have been discussed with the patient.  These include bleeding, infection, kidney damage, stroke, heart attack,  death.  The patient understands these risks  and is willing to proceed.

## 2018-09-01 NOTE — H&P (View-Only) (Signed)
SUBJECTIVE: The patient presents for annual follow-up.  He previously underwent a nuclear stress test which demonstrated a defect in the inferior wall suggestive of possible prior myocardial infarction. There was no ischemia.  He has a h/o nonischemic cardiomyopathy and chronic systolic heart failure.  Echo 05/17/16 moderately reduced LV systolic function, EF 86-76% (45-50% 05/24/14), grade 1 diastolic dysfunction, and mild aortic and mitral regurgitation.  ECG performed in the office today which I ordered and personally interpreted demonstrates normal sinus rhythm with a PVC and nonspecific T wave abnormalities.  He is here with his wife.  He apparently was diagnosed with a skin infection on his nose in November.  He was started on Bactrim and then developed abdominal discomfort and bloating.  He has been struggling with right shoulder pain and also has some carpal tunnel syndrome and is right wrist.  He told me that his liver enzymes were elevated but upon most recent testing they have normalized.  He also describes progressive exertional dyspnea with routine activities such as making the bed.  This has gotten worse over the last several months.  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  The patient said most recent labs indicate he is not anemic.      Review of Systems: As per "subjective", otherwise negative.  Allergies  Allergen Reactions  . Lisinopril Cough  . Lisinopril-Hydrochlorothiazide Cough    Current Outpatient Medications  Medication Sig Dispense Refill  . atorvastatin (LIPITOR) 80 MG tablet Take 80 mg by mouth every evening.     . budesonide (RHINOCORT ALLERGY) 32 MCG/ACT nasal spray Place 1 spray into both nostrils daily as needed for allergies.     . carvedilol (COREG) 3.125 MG tablet Take 1 tablet (3.125 mg total) by mouth 2 (two) times daily. 60 tablet 6  . diazepam (VALIUM) 5 MG  tablet Take 5 mg by mouth daily as needed (for vertigo).     . diazepam (VALIUM) 5 MG tablet Take 1 tablet (5 mg total) by mouth every 6 (six) hours as needed for muscle spasms. 30 tablet 0  . docusate sodium (COLACE) 100 MG capsule Take 200 mg by mouth at bedtime.    . furosemide (LASIX) 20 MG tablet TAKE 1 TABLET BY MOUTH ONCE DAILY AND  AS  DIRECTED 90 tablet 3  . INVOKANA 300 MG TABS Take 300 mg by mouth daily.     Marland Kitchen losartan (COZAAR) 25 MG tablet Take 0.5 tablets (12.5 mg total) by mouth every evening. 15 tablet 6  . pantoprazole (PROTONIX) 40 MG tablet Take 40 mg by mouth daily.     . repaglinide (PRANDIN) 2 MG tablet Take 2 mg by mouth 2 (two) times daily before a meal.     . traMADol (ULTRAM) 50 MG tablet 1-2 q 4-6 prn pain 30 tablet 0   No current facility-administered medications for this visit.     Past Medical History:  Diagnosis Date  . Anal fissure   . APML (acute promyelocytic leukemia) in remission ([place])    completed treatment 04/2013  . Coronary artery disease   . Difficult intubation   . DM type 2 (diabetes mellitus, type 2) ([place])   . Gastroesophageal reflux disease   . Heart murmur   . Hypercholesteremia   . Myocardial infarction [facility])    " silent"  . Nephrolithiasis   . Nonischemic cardiomyopathy ([place])   . Peptic ulcer disease  remote  . Ureteral colic   . Wears glasses   . Wears hearing aid in both ears     Past Surgical History:  Procedure Laterality Date  . ANAL FISSURE REPAIR    . APPENDECTOMY    . CARPAL TUNNEL RELEASE Left 10/16/2017   Procedure: LEFT CARPAL TUNNEL RELEASE;  Surgeon: Earlie Server, MD;  Location: [place];  Service: Orthopedics;  Laterality: Left;  . CATARACT EXTRACTION, BILATERAL    . CERVICAL DISC ARTHROPLASTY N/A 01/05/2018   Procedure: Artificial Cervical Disc ReplacementCervical Three-Four, Cervical Four-Five;  Surgeon: Kristeen Miss, MD;  Location: [place];  Service: Neurosurgery;  Laterality: N/A;  . CERVICAL FUSION    .  CHOLECYSTECTOMY    . COLONOSCOPY N/A 06/27/2015   Procedure: COLONOSCOPY;  Surgeon: Rogene Houston, MD;  [facility];  Service: Endoscopy;  Laterality: N/A;  730  . KNEE SURGERY     x3  . LEFT AND RIGHT HEART CATHETERIZATION WITH CORONARY ANGIOGRAM N/A 12/12/2013   Procedure: LEFT AND RIGHT HEART CATHETERIZATION WITH CORONARY ANGIOGRAM;  Surgeon: [name], MD;  Location: [facility] CATH LAB;  Service: Cardiovascular;  Laterality: N/A;  . LUMBAR FUSION    . SHOULDER ACROMIOPLASTY Right 07/18/2015   Procedure: SHOULDER ACROMIOPLASTY;  Surgeon: Earlie Server, MD;  Location: [place];  Service: Orthopedics;  Laterality: Right;  . SHOULDER ARTHROSCOPY Right 07/18/2015   Procedure: Right shoulder arthroscopy with debridement;  Surgeon: Earlie Server, MD;  Location: [place];  Service: Orthopedics;  Laterality: Right;  . SHOULDER SURGERY     x3  . TONSILLECTOMY      Social History   Socioeconomic History  . Marital status: Married    Spouse name: Not on file  . Number of children: Not on file  . Years of education: Not on file  . Highest education level: Not on file  Occupational History  . Occupation: RETIRED    Comment: [employer]  . Occupation: SECURITY GUARD  Social Needs  . Financial resource strain: Not on file  . Food insecurity:    Worry: Not on file    Inability: Not on file  . Transportation needs:    Medical: Not on file    Non-medical: Not on file  Tobacco Use  . Smoking status: Former Smoker    Packs/day: 2.00    Years: 17.00    Pack years: 34.00    Types: Cigarettes    Start date: 02/17/1968    Last attempt to quit: 02/28/1986    Years since quitting: 32.5  . Smokeless tobacco: Never Used  Substance and Sexual Activity  . Alcohol use: No    Alcohol/week: 0.0 standard drinks  . Drug use: No  . Sexual activity: Yes  Lifestyle  . Physical activity:    Days per week: Not on file    Minutes per session: Not on  file  . Stress: Not on file  Relationships  . Social connections:    Talks on phone: Not on file    Gets together: Not on file    Attends religious service: Not on file    Active member of club or organization: Not on file    Attends meetings of clubs or organizations: Not on file    Relationship status: Not on file  . Intimate partner violence:    Fear of current or ex partner: Not on file    Emotionally abused: Not on file    Physically abused: Not  on file    Forced sexual activity: Not on file  Other Topics Concern  . Not on file  Social History Narrative  . Not on file     Vitals:   09/01/18 1517  BP: 130/82  Pulse: (!) 103  SpO2: 97%  Weight: 194 lb (88 kg)  Height: 5\' 9"  (1.753 m)    Wt Readings from Last 3 Encounters:  09/01/18 194 lb (88 kg)  01/05/18 195 lb 1.6 oz (88.5 kg)  12/28/17 195 lb 1.6 oz (88.5 kg)     PHYSICAL EXAM General: NAD HEENT: Normal. Neck: No JVD, no thyromegaly. Lungs: Clear to auscultation bilaterally with normal respiratory effort. CV: Regular rate and rhythm, normal S1/S2, no S3/S4, no murmur. No pretibial or periankle edema.  No carotid bruit.   Abdomen: Soft, nontender, no distention.  Neurologic: Alert and oriented.  Psych: Normal affect. Skin: Normal. Musculoskeletal: No gross deformities.    ECG: Reviewed above under Subjective   Labs: Lab Results  Component Value Date/Time   K 3.8 12/28/2017 10:23 AM   BUN 10 12/28/2017 10:23 AM   CREATININE 0.75 12/28/2017 10:23 AM   CREATININE 0.82 08/30/2012 10:16 AM   ALT 22 12/09/2013 07:41 AM   TSH 1.113 05/16/2016 04:15 PM   TSH 0.692 08/16/2012 03:20 PM   HGB 16.5 12/28/2017 10:23 AM   HGB 13.1 09/03/2012 08:41 AM     Lipids: Lab Results  Component Value Date/Time   LDLCALC 163 (H) 12/11/2013 07:23 AM   CHOL 223 (H) 12/11/2013 07:23 AM   TRIG 122 12/11/2013 07:23 AM   HDL 36 (L) 12/11/2013 07:23 AM       ASSESSMENT AND PLAN:  1. Nonischemic  cardiomyopathy/chronic systolic heart failure: He has had progressive exertional dyspnea.  EF 35-40% on 05/17/16 (45-50% 05/24/14). He is currently compensated and euvolemic. He is intolerant of ACEI's as they caused a dry cough.  Continue low-dose losartan and Coreg. I previously instructed him to take an extra tablet of Lasix should he experience increasing shortness of breath, leg swelling, or weight gain, and to inform me if this does occur. His cardiomyopathy may be related to a viral etiology (prior infections related to Hickman catheter) or a tachycardia mediated etiology. There has been interval declinein LV systolic function as reviewed above. Exercise Myoviewreviewed above (no ischemia) with possible old infarct. I will obtain a follow-up echocardiogram given progressive exertional dyspnea and fatigue to see if there have been interval changes in cardiac structure and function.  I will also obtain a copy of labs from PCP for personal review.  2. Essential hypertension: Well controlled on current regimen. No changes.  3. CAD with 50-60% RCA lesion: He has had progressive exertional dyspnea and fatigue.  I will obtain a Lexiscan Myoview to evaluate for any significant ischemic territories.  I will also obtain a follow-up echocardiogram to evaluate for interval changes in cardiac structure and function.  No changes to medication regimen which include ASA, Coreg, and statin.   4. Valvular heart disease: Stable.  I will obtain a follow-up echocardiogram.  5. Palpitations: Currently stable. Consider event monitoring in the future.    Disposition: Follow up 6 weeks   Kate Sable, M.D., F.A.C.C.  Addendum:  Nuclear stress test was abnormal with moderate peri-infarct ischemia.  I have arranged for coronary angiography. Risks and benefits of cardiac catheterization have been discussed with the patient.  These include bleeding, infection, kidney damage, stroke, heart attack,  death.  The patient understands these risks  and is willing to proceed.

## 2018-09-01 NOTE — Telephone Encounter (Signed)
Pre-cert Verification for the following procedure   Lexiscan myoview scheduled for 09-09-2018 at [facility]

## 2018-09-01 NOTE — Patient Instructions (Addendum)
Medication Instructions:   Your physician recommends that you continue on your current medications as directed. Please refer to the Current Medication list given to you today.  Labwork:  NONE  Testing/Procedures: Your physician has requested that you have an echocardiogram. Echocardiography is a painless test that uses sound waves to create images of your heart. It provides your doctor with information about the size and shape of your heart and how well your heart's chambers and valves are working. This procedure takes approximately one hour. There are no restrictions for this procedure. Your physician has requested that you have a lexiscan myoview. For further information please visit [email or web address]. Please follow instruction sheet, as given.  Follow-Up:  Your physician recommends that you schedule a follow-up appointment in: 6 weeks with Bernerd Pho PA at the [place] office.  Any Other Special Instructions Will Be Listed Below (If Applicable).  If you need a refill on your cardiac medications before your next appointment, please call your pharmacy.

## 2018-09-02 ENCOUNTER — Telehealth: Payer: Self-pay | Admitting: *Deleted

## 2018-09-02 ENCOUNTER — Ambulatory Visit (INDEPENDENT_AMBULATORY_CARE_PROVIDER_SITE_OTHER): Payer: Medicare Other

## 2018-09-02 ENCOUNTER — Other Ambulatory Visit: Payer: Self-pay

## 2018-09-02 DIAGNOSIS — R0609 Other forms of dyspnea: Secondary | ICD-10-CM | POA: Diagnosis not present

## 2018-09-02 DIAGNOSIS — I428 Other cardiomyopathies: Secondary | ICD-10-CM

## 2018-09-02 DIAGNOSIS — R0600 Dyspnea, unspecified: Secondary | ICD-10-CM

## 2018-09-02 NOTE — Telephone Encounter (Signed)
Notes recorded by Laurine Blazer, LPN on 02/18/6268 at 4:85 PM EST Patient notified. Copy to pmd. Follow up scheduled for March.   ------  Notes recorded by Herminio Commons, MD on 09/02/2018 at 12:59 PM EST Cardiac function is moderately reduced with no significant changes when compared to most recent echocardiogram.

## 2018-09-09 ENCOUNTER — Encounter (HOSPITAL_COMMUNITY): Payer: Self-pay

## 2018-09-09 ENCOUNTER — Encounter (HOSPITAL_BASED_OUTPATIENT_CLINIC_OR_DEPARTMENT_OTHER)
Admission: RE | Admit: 2018-09-09 | Discharge: 2018-09-09 | Disposition: A | Discharge status: Home / Self Care | Payer: Medicare Other | Source: Ambulatory Visit | Attending: Cardiovascular Disease | Admitting: Cardiovascular Disease

## 2018-09-09 ENCOUNTER — Encounter (HOSPITAL_COMMUNITY)
Admission: RE | Admit: 2018-09-09 | Discharge: 2018-09-09 | Disposition: A | Discharge status: Home / Self Care | Payer: Medicare Other | Source: Ambulatory Visit | Attending: Cardiovascular Disease | Admitting: Cardiovascular Disease

## 2018-09-09 DIAGNOSIS — I428 Other cardiomyopathies: Secondary | ICD-10-CM | POA: Diagnosis not present

## 2018-09-09 DIAGNOSIS — R0609 Other forms of dyspnea: Secondary | ICD-10-CM | POA: Diagnosis not present

## 2018-09-09 DIAGNOSIS — R0600 Dyspnea, unspecified: Secondary | ICD-10-CM

## 2018-09-09 LAB — NM MYOCAR MULTI W/SPECT W/WALL MOTION / EF
LV dias vol: 158 mL (ref 62–150)
LV sys vol: 105 mL
Peak HR: 103 {beats}/min
RATE: 0.47
Rest HR: 80 {beats}/min
SDS: 1
SRS: 2
SSS: 3
TID: 0.97

## 2018-09-09 MED ORDER — TECHNETIUM TC 99M TETROFOSMIN IV KIT
10.0000 | PACK | Freq: Once | INTRAVENOUS | Status: AC | PRN
  Administered 2018-09-09: 10.2 via INTRAVENOUS

## 2018-09-09 MED ORDER — REGADENOSON 0.4 MG/5ML IV SOLN
INTRAVENOUS | Status: AC
  Administered 2018-09-09: 0.4 mg via INTRAVENOUS
  Filled 2018-09-09: qty 5

## 2018-09-09 MED ORDER — TECHNETIUM TC 99M TETROFOSMIN IV KIT
30.0000 | PACK | Freq: Once | INTRAVENOUS | Status: AC | PRN
  Administered 2018-09-09: 31 via INTRAVENOUS

## 2018-09-09 MED ORDER — SODIUM CHLORIDE 0.9% FLUSH
INTRAVENOUS | Status: AC
  Administered 2018-09-09: 10 mL via INTRAVENOUS
  Filled 2018-09-09: qty 10

## 2018-09-10 ENCOUNTER — Telehealth: Payer: Self-pay | Admitting: Cardiovascular Disease

## 2018-09-10 DIAGNOSIS — R0609 Other forms of dyspnea: Principal | ICD-10-CM

## 2018-09-10 DIAGNOSIS — Z01812 Encounter for preprocedural laboratory examination: Secondary | ICD-10-CM

## 2018-09-10 DIAGNOSIS — R9439 Abnormal result of other cardiovascular function study: Secondary | ICD-10-CM

## 2018-09-10 DIAGNOSIS — R0600 Dyspnea, unspecified: Secondary | ICD-10-CM

## 2018-09-10 NOTE — Telephone Encounter (Signed)
Notes recorded by Laurine Blazer, LPN on 3/70/2301 at 7:20 PM EST Discussed with patient this evening. Copy to pmd. Okay to schedule anytime per patient. Follow up already scheduled 10/13/18 with BS in [place] office. ------  Notes recorded by Laurine Blazer, LPN on 04/21/6815 at 6:19 PM EST Left message to return call.  ------  Notes recorded by Herminio Commons, MD on 09/09/2018 at 11:39 AM EST There appears to be suggestion of possible blockages as the cause for his symptoms. I think cath is indicated. Please arrange.

## 2018-09-10 NOTE — Telephone Encounter (Signed)
Wife calling  Very concerned with reports that they have seen in [place]. Would like a call back today please

## 2018-09-13 ENCOUNTER — Encounter: Payer: Self-pay | Admitting: *Deleted

## 2018-09-13 NOTE — Telephone Encounter (Signed)
Left heart cath scheduled for Friday, 09/17/18 at 7:30 am with Dr. Fletcher Anon.  Patient to arrive at 5:30 am.

## 2018-09-13 NOTE — Telephone Encounter (Signed)
Informed patient that I will be out of the office tomorrow, but will return on Wednesday should he have any questions.  He verbalized understanding.

## 2018-09-13 NOTE — Telephone Encounter (Signed)
Patient notified and verbalized understanding.  Instructions given - he will pick up lab order and paper copy of instructions.

## 2018-09-14 ENCOUNTER — Other Ambulatory Visit: Payer: Self-pay | Admitting: Cardiovascular Disease

## 2018-09-14 ENCOUNTER — Telehealth: Payer: Self-pay | Admitting: Cardiovascular Disease

## 2018-09-14 DIAGNOSIS — R0609 Other forms of dyspnea: Secondary | ICD-10-CM

## 2018-09-14 DIAGNOSIS — R0600 Dyspnea, unspecified: Secondary | ICD-10-CM

## 2018-09-14 DIAGNOSIS — R9439 Abnormal result of other cardiovascular function study: Secondary | ICD-10-CM

## 2018-09-14 LAB — CBC
HCT: 48.8 % (ref 38.5–50.0)
Hemoglobin: 17.3 g/dL — ABNORMAL HIGH (ref 13.2–17.1)
MCH: 32.5 pg (ref 27.0–33.0)
MCHC: 35.5 g/dL (ref 32.0–36.0)
MCV: 91.7 fL (ref 80.0–100.0)
MPV: 9.6 fL (ref 7.5–12.5)
Platelets: 240 10*3/uL (ref 140–400)
RBC: 5.32 10*6/uL (ref 4.20–5.80)
RDW: 13.3 % (ref 11.0–15.0)
WBC: 4.7 10*3/uL (ref 3.8–10.8)

## 2018-09-14 LAB — BASIC METABOLIC PANEL
BUN: 15 mg/dL (ref 7–25)
CO2: 24 mmol/L (ref 20–32)
Calcium: 9.6 mg/dL (ref 8.6–10.3)
Chloride: 104 mmol/L (ref 98–110)
Creat: 0.84 mg/dL (ref 0.70–1.25)
Glucose, Bld: 203 mg/dL — ABNORMAL HIGH (ref 65–139)
Potassium: 4.4 mmol/L (ref 3.5–5.3)
Sodium: 139 mmol/L (ref 135–146)

## 2018-09-14 NOTE — Telephone Encounter (Signed)
Pre-cert Verification for the following procedure    Left heart cath scheduled for Friday, 09/17/18 at 7:30 am with Dr. Fletcher Anon.

## 2018-09-15 ENCOUNTER — Telehealth: Payer: Self-pay | Admitting: *Deleted

## 2018-09-15 NOTE — Telephone Encounter (Signed)
Notes recorded by Laurine Blazer, LPN on 08/14/9700 at 6:37 PM EST Patient notified. Copy to pmd. Follow up scheduled for 10/13/18 with [facility]. ------  Notes recorded by Herminio Commons, MD on 09/15/2018 at 9:02 AM EST Normal renal function. ------  Notes recorded by Herminio Commons, MD on 09/14/2018 at 1:38 PM EST Labs ok. Not anemic.

## 2018-09-16 ENCOUNTER — Telehealth: Payer: Self-pay | Admitting: *Deleted

## 2018-09-16 NOTE — Telephone Encounter (Signed)
Pt contacted pre-catheterization scheduled at [facility] for: Friday September 17, 2018 7:30 AM Verified arrival time and place: [place] Entrance A at: 5:30 AM  No solid food after midnight prior to cath, clear liquids until 5 AM day of procedure. Contrast allergy: no  Hold: Invokana-AM of procedure. Prandin-AM of procedure. Furosemide-AM of procedure.   Except hold medications AM meds can be  taken pre-cath with sip of water including: ASA 81 mg  Confirmed patient has responsible person to drive home post procedure and observe 24 hours after arriving home: yes

## 2018-09-17 ENCOUNTER — Other Ambulatory Visit: Payer: Self-pay | Admitting: Cardiovascular Disease

## 2018-09-17 ENCOUNTER — Telehealth: Payer: Self-pay | Admitting: Cardiovascular Disease

## 2018-09-17 ENCOUNTER — Encounter (HOSPITAL_COMMUNITY)
Admission: RE | Disposition: A | Discharge status: Home / Self Care | Payer: Self-pay | Source: Home / Self Care | Attending: Cardiovascular Disease

## 2018-09-17 ENCOUNTER — Encounter (HOSPITAL_COMMUNITY): Payer: Self-pay | Admitting: Cardiovascular Disease

## 2018-09-17 ENCOUNTER — Ambulatory Visit (HOSPITAL_COMMUNITY)
Admission: RE | Admit: 2018-09-17 | Discharge: 2018-09-17 | Disposition: A | Discharge status: Home / Self Care | Payer: Medicare Other | Attending: Cardiovascular Disease | Admitting: Cardiovascular Disease

## 2018-09-17 DIAGNOSIS — I428 Other cardiomyopathies: Secondary | ICD-10-CM | POA: Insufficient documentation

## 2018-09-17 DIAGNOSIS — K219 Gastro-esophageal reflux disease without esophagitis: Secondary | ICD-10-CM | POA: Insufficient documentation

## 2018-09-17 DIAGNOSIS — Z87891 Personal history of nicotine dependence: Secondary | ICD-10-CM | POA: Diagnosis not present

## 2018-09-17 DIAGNOSIS — I2510 Atherosclerotic heart disease of native coronary artery without angina pectoris: Secondary | ICD-10-CM | POA: Diagnosis not present

## 2018-09-17 DIAGNOSIS — E119 Type 2 diabetes mellitus without complications: Secondary | ICD-10-CM | POA: Diagnosis not present

## 2018-09-17 DIAGNOSIS — R0609 Other forms of dyspnea: Secondary | ICD-10-CM

## 2018-09-17 DIAGNOSIS — E7800 Pure hypercholesterolemia, unspecified: Secondary | ICD-10-CM | POA: Insufficient documentation

## 2018-09-17 DIAGNOSIS — Z79899 Other long term (current) drug therapy: Secondary | ICD-10-CM | POA: Diagnosis not present

## 2018-09-17 DIAGNOSIS — Z888 Allergy status to other drugs, medicaments and biological substances status: Secondary | ICD-10-CM | POA: Insufficient documentation

## 2018-09-17 DIAGNOSIS — R9439 Abnormal result of other cardiovascular function study: Secondary | ICD-10-CM | POA: Insufficient documentation

## 2018-09-17 DIAGNOSIS — I110 Hypertensive heart disease with heart failure: Secondary | ICD-10-CM | POA: Diagnosis not present

## 2018-09-17 DIAGNOSIS — I5022 Chronic systolic (congestive) heart failure: Secondary | ICD-10-CM | POA: Insufficient documentation

## 2018-09-17 DIAGNOSIS — I252 Old myocardial infarction: Secondary | ICD-10-CM | POA: Diagnosis not present

## 2018-09-17 DIAGNOSIS — R0600 Dyspnea, unspecified: Secondary | ICD-10-CM

## 2018-09-17 HISTORY — PX: LEFT HEART CATH AND CORONARY ANGIOGRAPHY: CATH118249

## 2018-09-17 LAB — CARDIAC CATHETERIZATION: Cath EF Quantitative: 30 %

## 2018-09-17 LAB — GLUCOSE, CAPILLARY: Glucose-Capillary: 213 mg/dL — ABNORMAL HIGH (ref 70–99)

## 2018-09-17 SURGERY — LEFT HEART CATH AND CORONARY ANGIOGRAPHY
Anesthesia: LOCAL

## 2018-09-17 MED ORDER — FENTANYL CITRATE (PF) 100 MCG/2ML IJ SOLN
INTRAMUSCULAR | Status: DC | PRN
  Administered 2018-09-17: 50 ug via INTRAVENOUS

## 2018-09-17 MED ORDER — SODIUM CHLORIDE 0.9% FLUSH: 3.0000 mL | INTRAVENOUS | Status: DC | PRN

## 2018-09-17 MED ORDER — HEPARIN SODIUM (PORCINE) 1000 UNIT/ML IJ SOLN
INTRAMUSCULAR | Status: DC | PRN
  Administered 2018-09-17: 4000 [IU] via INTRAVENOUS

## 2018-09-17 MED ORDER — ASPIRIN 81 MG PO CHEW: 81.0000 mg | CHEWABLE_TABLET | ORAL | Status: AC

## 2018-09-17 MED ORDER — LIDOCAINE HCL (PF) 1 % IJ SOLN
INTRAMUSCULAR | Status: AC
  Filled 2018-09-17: qty 30

## 2018-09-17 MED ORDER — VERAPAMIL HCL 2.5 MG/ML IV SOLN
INTRAVENOUS | Status: AC
  Filled 2018-09-17: qty 2

## 2018-09-17 MED ORDER — IOHEXOL 350 MG/ML SOLN
INTRAVENOUS | Status: DC | PRN
  Administered 2018-09-17: 50 mL via INTRAVENOUS

## 2018-09-17 MED ORDER — HEPARIN (PORCINE) IN NACL 1000-0.9 UT/500ML-% IV SOLN
INTRAVENOUS | Status: DC | PRN
  Administered 2018-09-17: 500 mL

## 2018-09-17 MED ORDER — LIDOCAINE HCL (PF) 1 % IJ SOLN
INTRAMUSCULAR | Status: DC | PRN
  Administered 2018-09-17: 2 mL

## 2018-09-17 MED ORDER — HEPARIN (PORCINE) IN NACL 1000-0.9 UT/500ML-% IV SOLN
INTRAVENOUS | Status: AC
  Filled 2018-09-17: qty 500

## 2018-09-17 MED ORDER — HEPARIN (PORCINE) IN NACL 1000-0.9 UT/500ML-% IV SOLN
INTRAVENOUS | Status: AC
  Filled 2018-09-17: qty 1000

## 2018-09-17 MED ORDER — VERAPAMIL HCL 2.5 MG/ML IV SOLN
INTRAVENOUS | Status: DC | PRN
  Administered 2018-09-17: 10 mL via INTRA_ARTERIAL

## 2018-09-17 MED ORDER — SODIUM CHLORIDE 0.9 % IV SOLN: 250.0000 mL | INTRAVENOUS | Status: DC | PRN

## 2018-09-17 MED ORDER — MIDAZOLAM HCL 2 MG/2ML IJ SOLN
INTRAMUSCULAR | Status: DC | PRN
  Administered 2018-09-17: 1 mg via INTRAVENOUS

## 2018-09-17 MED ORDER — MIDAZOLAM HCL 2 MG/2ML IJ SOLN
INTRAMUSCULAR | Status: AC
  Filled 2018-09-17: qty 2

## 2018-09-17 MED ORDER — SODIUM CHLORIDE 0.9 % WEIGHT BASED INFUSION: 1.0000 mL/kg/h | INTRAVENOUS | Status: DC

## 2018-09-17 MED ORDER — FENTANYL CITRATE (PF) 100 MCG/2ML IJ SOLN
INTRAMUSCULAR | Status: AC
  Filled 2018-09-17: qty 2

## 2018-09-17 MED ORDER — SODIUM CHLORIDE 0.9% FLUSH: 3.0000 mL | Freq: Two times a day (BID) | INTRAVENOUS | Status: DC

## 2018-09-17 MED ORDER — HEPARIN (PORCINE) IN NACL 1000-0.9 UT/500ML-% IV SOLN
INTRAVENOUS | Status: DC | PRN
  Administered 2018-09-17 (×2): 500 mL

## 2018-09-17 MED ORDER — SODIUM CHLORIDE 0.9 % WEIGHT BASED INFUSION
3.0000 mL/kg/h | INTRAVENOUS | Status: AC
  Administered 2018-09-17: 3 mL/kg/h via INTRAVENOUS

## 2018-09-17 SURGICAL SUPPLY — 9 items
CATH INFINITI 5FR JK (CATHETERS) ×1 IMPLANT
DEVICE RAD COMP TR BAND LRG (VASCULAR PRODUCTS) ×1 IMPLANT
GLIDESHEATH SLEND SS 6F .021 (SHEATH) ×1 IMPLANT
GUIDEWIRE INQWIRE 1.5J.035X260 (WIRE) IMPLANT
INQWIRE 1.5J .035X260CM (WIRE) ×2
KIT HEART LEFT (KITS) ×2 IMPLANT
PACK CARDIAC CATHETERIZATION (CUSTOM PROCEDURE TRAY) ×2 IMPLANT
TRANSDUCER W/STOPCOCK (MISCELLANEOUS) ×2 IMPLANT
TUBING CIL FLEX 10 FLL-RA (TUBING) ×2 IMPLANT

## 2018-09-17 NOTE — Discharge Instructions (Signed)
Radial Site Care ° °This sheet gives you information about how to care for yourself after your procedure. Your health care provider may also give you more specific instructions. If you have problems or questions, contact your health care provider. °What can I expect after the procedure? °After the procedure, it is common to have: °· Bruising and tenderness at the catheter insertion area. °Follow these instructions at home: °Medicines °· Take over-the-counter and prescription medicines only as told by your health care provider. °Insertion site care °· Follow instructions from your health care provider about how to take care of your insertion site. Make sure you: °? Wash your hands with soap and water before you change your bandage (dressing). If soap and water are not available, use hand sanitizer. °? Change your dressing as told by your health care provider. °? Leave stitches (sutures), skin glue, or adhesive strips in place. These skin closures may need to stay in place for 2 weeks or longer. If adhesive strip edges start to loosen and curl up, you may trim the loose edges. Do not remove adhesive strips completely unless your health care provider tells you to do that. °· Check your insertion site every day for signs of infection. Check for: °? Redness, swelling, or pain. °? Fluid or blood. °? Pus or a bad smell. °? Warmth. °· Do not take baths, swim, or use a hot tub until your health care provider approves. °· You may shower 24-48 hours after the procedure, or as directed by your health care provider. °? Remove the dressing and gently wash the site with plain soap and water. °? Pat the area dry with a clean towel. °? Do not rub the site. That could cause bleeding. °· Do not apply powder or lotion to the site. °Activity ° °· For 24 hours after the procedure, or as directed by your health care provider: °? Do not flex or bend the affected arm. °? Do not push or pull heavy objects with the affected arm. °? Do not  drive yourself home from the hospital or clinic. You may drive 24 hours after the procedure unless your health care provider tells you not to. °? Do not operate machinery or power tools. °· Do not lift anything that is heavier than 10 lb (4.5 kg), or the limit that you are told, until your health care provider says that it is safe. °· Ask your health care provider when it is okay to: °? Return to work or school. °? Resume usual physical activities or sports. °? Resume sexual activity. °General instructions °· If the catheter site starts to bleed, raise your arm and put firm pressure on the site. If the bleeding does not stop, get help right away. This is a medical emergency. °· If you went home on the same day as your procedure, a responsible adult should be with you for the first 24 hours after you arrive home. °· Keep all follow-up visits as told by your health care provider. This is important. °Contact a health care provider if: °· You have a fever. °· You have redness, swelling, or yellow drainage around your insertion site. °Get help right away if: °· You have unusual pain at the radial site. °· The catheter insertion area swells very fast. °· The insertion area is bleeding, and the bleeding does not stop when you hold steady pressure on the area. °· Your arm or hand becomes pale, cool, tingly, or numb. °These symptoms may represent a serious problem   that is an emergency. Do not wait to see if the symptoms will go away. Get medical help right away. Call your local emergency services (911 in the [place] Do not drive yourself to the hospital. °Summary °· After the procedure, it is common to have bruising and tenderness at the site. °· Follow instructions from your health care provider about how to take care of your radial site wound. Check the wound every day for signs of infection. °· Do not lift anything that is heavier than 10 lb (4.5 kg), or the limit that you are told, until your health care provider says  that it is safe. °This information is not intended to replace advice given to you by your health care provider. Make sure you discuss any questions you have with your health care provider. °Document Released: 08/30/2010 Document Revised: 09/02/2017 Document Reviewed: 09/02/2017 °Elsevier Interactive Patient Education © 2019 [employer] ° ° ° °Moderate Conscious Sedation, Adult, Care After °These instructions provide you with information about caring for yourself after your procedure. Your health care provider may also give you more specific instructions. Your treatment has been planned according to current medical practices, but problems sometimes occur. Call your health care provider if you have any problems or questions after your procedure. °What can I expect after the procedure? °After your procedure, it is common: °· To feel sleepy for several hours. °· To feel clumsy and have poor balance for several hours. °· To have poor judgment for several hours. °· To vomit if you eat too soon. °Follow these instructions at home: °For at least 24 hours after the procedure: ° °· Do not: °? Participate in activities where you could fall or become injured. °? Drive. °? Use heavy machinery. °? Drink alcohol. °? Take sleeping pills or medicines that cause drowsiness. °? Make important decisions or sign legal documents. °? Take care of children on your own. °· Rest. °Eating and drinking °· Follow the diet recommended by your health care provider. °· If you vomit: °? Drink water, juice, or soup when you can drink without vomiting. °? Make sure you have little or no nausea before eating solid foods. °General instructions °· Have a responsible adult stay with you until you are awake and alert. °· Take over-the-counter and prescription medicines only as told by your health care provider. °· If you smoke, do not smoke without supervision. °· Keep all follow-up visits as told by your health care provider. This is  important. °Contact a health care provider if: °· You keep feeling nauseous or you keep vomiting. °· You feel light-headed. °· You develop a rash. °· You have a fever. °Get help right away if: °· You have trouble breathing. °This information is not intended to replace advice given to you by your health care provider. Make sure you discuss any questions you have with your health care provider. °Document Released: 05/18/2013 Document Revised: 12/31/2015 Document Reviewed: 11/17/2015 °Elsevier Interactive Patient Education © 2019 [employer] ° °

## 2018-09-17 NOTE — Telephone Encounter (Signed)
Patient wife called asking about if medication changes are needed and if Dr Bronson Ing needed to see him sooner than March?  Still has SOB with exertion and they are concerned

## 2018-09-17 NOTE — Interval H&P Note (Signed)
Cath Lab Visit (complete for each Cath Lab visit)  Clinical Evaluation Leading to the Procedure:   ACS: No.  Non-ACS:    Anginal Classification: CCS II  Anti-ischemic medical therapy: Minimal Therapy (1 class of medications)  Non-Invasive Test Results: High-risk stress test findings: cardiac mortality >3%/year  Prior CABG: No previous CABG      History and Physical Interval Note:  09/17/2018 7:46 AM  Ricardo Zuniga  has presented today for surgery, with the diagnosis of as  The various methods of treatment have been discussed with the patient and family. After consideration of risks, benefits and other options for treatment, the patient has consented to  Procedure(s): LEFT HEART CATH AND CORONARY ANGIOGRAPHY (N/A) as a surgical intervention .  The patient's history has been reviewed, patient examined, no change in status, stable for surgery.  I have reviewed the patient's chart and labs.  Questions were answered to the patient's satisfaction.     Kathlyn Sacramento

## 2018-09-17 NOTE — Telephone Encounter (Signed)
Monitor BP and HR. If HR in 80 bpm range or higher and SBP in 120 mmHg range or higher, increase Coreg to 6.25 mg bid. Hold losartan and after 36 hr washout, start Entresto 24-26 mg bid. If symptoms continue to worsen, have him see APP in near future.

## 2018-09-17 NOTE — Telephone Encounter (Signed)
Spoke with wife and she is asking if the medication changes can be made or if patient can be seen earlier since he is still having SOB. Wife advised that message would be sent to his provider for recommendations and that it would be next week before we contacted her back. Advised wife that if symptoms get worse, to take patient to the ED for an evaluation. Verbalized understanding.

## 2018-09-20 NOTE — Telephone Encounter (Signed)
Returned call

## 2018-09-20 NOTE — Telephone Encounter (Signed)
Wife informed and verbalized understanding of plan. Wife plans to contact our office in a few days with he HR and BP readings. Also, patient is going to the beach in the morning and doesn't want entresto called in until he gets back from the beach.

## 2018-09-23 ENCOUNTER — Encounter: Payer: Self-pay | Admitting: *Deleted

## 2018-09-23 ENCOUNTER — Telehealth: Payer: Self-pay | Admitting: *Deleted

## 2018-09-23 MED ORDER — SACUBITRIL-VALSARTAN 24-26 MG PO TABS: 1.0000 | ORAL_TABLET | Freq: Two times a day (BID) | ORAL | 0 refills | Status: DC

## 2018-09-23 MED ORDER — METOPROLOL SUCCINATE ER 25 MG PO TB24: 25.0000 mg | ORAL_TABLET | Freq: Every day | ORAL | 6 refills | Status: DC

## 2018-09-23 MED ORDER — SACUBITRIL-VALSARTAN 24-26 MG PO TABS: 1.0000 | ORAL_TABLET | Freq: Two times a day (BID) | ORAL | 6 refills | Status: DC

## 2018-09-23 NOTE — Telephone Encounter (Signed)
Patient calling to give update on BP readings -    2/10 - 117/68  98           114/65  99           118/67  113  2/11 - 125/78  100            111/59  101             124/73  93  2/12 - 100/56  81            119/70  83  2/13 - 127/72  93           113/76  93  The above readings were all at rest.    Still c/o SOB with exertion.  Stated that he has not increased the Coreg or started the [place] yet as they were going to the beach & did not want to start new medication out of town.  Stated he is currently taking Coreg 3.125mg  twice a day and Losartan 25mg  - 1/2 tab daily.

## 2018-09-23 NOTE — Telephone Encounter (Signed)
Wife [name]) notified.  Will send new prescriptions to [facility].  Will print up instructions for patient at wife's request.  They will pick up this evening.

## 2018-09-23 NOTE — Telephone Encounter (Signed)
Let's switch carvedilol to Toprol-XL 25 mg daily.  Stop losartan and start Entresto 24-26 mg twice daily.

## 2018-10-13 ENCOUNTER — Encounter: Payer: Self-pay | Admitting: Student

## 2018-10-13 ENCOUNTER — Telehealth: Payer: Self-pay | Admitting: *Deleted

## 2018-10-13 ENCOUNTER — Ambulatory Visit: Payer: Medicare Other | Admitting: Student

## 2018-10-13 ENCOUNTER — Other Ambulatory Visit (HOSPITAL_COMMUNITY)
Admission: RE | Admit: 2018-10-13 | Discharge: 2018-10-13 | Disposition: A | Discharge status: Home / Self Care | Payer: Medicare Other | Source: Ambulatory Visit | Attending: Student | Admitting: Student

## 2018-10-13 ENCOUNTER — Other Ambulatory Visit: Payer: Self-pay

## 2018-10-13 VITALS — BP 128/82 | HR 96 | Ht 69.0 in | Wt 195.0 lb

## 2018-10-13 DIAGNOSIS — I10 Essential (primary) hypertension: Secondary | ICD-10-CM

## 2018-10-13 DIAGNOSIS — I5042 Chronic combined systolic (congestive) and diastolic (congestive) heart failure: Secondary | ICD-10-CM

## 2018-10-13 DIAGNOSIS — I428 Other cardiomyopathies: Secondary | ICD-10-CM

## 2018-10-13 DIAGNOSIS — E785 Hyperlipidemia, unspecified: Secondary | ICD-10-CM

## 2018-10-13 DIAGNOSIS — I25118 Atherosclerotic heart disease of native coronary artery with other forms of angina pectoris: Secondary | ICD-10-CM | POA: Diagnosis not present

## 2018-10-13 DIAGNOSIS — Z79899 Other long term (current) drug therapy: Secondary | ICD-10-CM | POA: Diagnosis present

## 2018-10-13 LAB — BASIC METABOLIC PANEL
Anion gap: 10 (ref 5–15)
BUN: 22 mg/dL (ref 8–23)
CO2: 21 mmol/L — ABNORMAL LOW (ref 22–32)
Calcium: 9.3 mg/dL (ref 8.9–10.3)
Chloride: 105 mmol/L (ref 98–111)
Creatinine, Ser: 0.94 mg/dL (ref 0.61–1.24)
GFR calc Af Amer: >60 mL/min (ref >60)
GFR calc non Af Amer: >60 mL/min (ref >60)
Glucose, Bld: 216 mg/dL — ABNORMAL HIGH (ref 70–99)
Potassium: 4 mmol/L (ref 3.5–5.1)
Sodium: 136 mmol/L (ref 135–145)

## 2018-10-13 MED ORDER — METOPROLOL SUCCINATE ER 25 MG PO TB24: 37.5000 mg | ORAL_TABLET | Freq: Every day | ORAL | 3 refills | Status: DC

## 2018-10-13 MED ORDER — METOPROLOL SUCCINATE ER 25 MG PO TB24: 37.5000 mg | ORAL_TABLET | Freq: Every day | ORAL | 11 refills | Status: DC

## 2018-10-13 NOTE — Telephone Encounter (Signed)
-----   Message from Erma Heritage, [place] sent at 10/13/2018  4:26 PM EST ----- Please let the patient know his kidney function and electrolytes remain stable. Glucose was elevated but suspect this given it was a nonfasting sample. Please forward a copy of labs to Glenda Chroman, MD.

## 2018-10-13 NOTE — Progress Notes (Signed)
Cardiology Office Note    Date:  10/13/2018   ID:  Ricardo Zuniga, DOB 1950/11/09, MRN [id number]  PCP:  Glenda Chroman, MD  Cardiologist: Kate Sable, MD    Chief Complaint  Patient presents with  . Follow-up    6 week visit    History of Present Illness:    Ricardo Zuniga is a 68 y.o. male with past medical history of chronic combined systolic and diastolic CHF/NICM (EF 73% in 2015 - thought to possibly be viral induced or chemotherapy induced from treatments in 2014, at 35-40% by echo in 05/2016), CAD (50-60% RCA stenosis by cath in 2015), HTN, HLD, and Type 2 DM who presents to the office today for 6-week follow-up.  He was last examined by Dr. Bronson Ing in 08/2018 and reported progressive dyspnea on exertion over the past several months which is occurring with routine activities such as making his bed. He was felt to be euvolemic on examination but given his progressive symptoms a repeat echocardiogram was arranged which showed his EF remained reduced at 40% with diffuse hypokinesis. A Lexiscan Myoview was performed for ischemic evaluation and showed evidence of a prior MI with mild to moderate peri-infarct ischemia and a cardiac catheterization was recommended for definitive evaluation. This was performed by Dr. Fletcher Anon on 09/17/2018 and showed moderate to severe left ventricular systolic dysfunction with an EF of 30% and moderate nonobstructive one-vessel coronary artery disease with the RCA stenosis actually appearing improved when compared to prior cath. Continued medical therapy was recommended with consideration of switching Losartan to Entresto and adding Spironolactone.  By review of phone notes, Carvedilol was switched to Toprol-XL 25 mg daily and Losartan was discontinued and he was started on Entresto 24-26mg  BID.   In talking with the patient and his wife today, he does report some improvement in his dyspnea since undergoing medication adjustments last month. He was  previously very active at baseline in doing work at home and as a retired Engineer, structural but says he does not have the same energy as he used to. Denies any recent episodes of chest pain or palpitations. No recent orthopnea, PND, or lower extremity edema. Says that weight has overall been stable on his home scales.  He reports good compliance with his current medication regimen and denies any noted side effects. He has been following his vitals closely at home and BP has ranged from the 90's-130's/50's-70's with HR variable from the 80's to 110's. He has been taking Toprol-XL in the PM hours.    Past Medical History:  Diagnosis Date  . Anal fissure   . APML (acute promyelocytic leukemia) in remission ([place])    completed treatment 04/2013  . Coronary artery disease    a. 50-60% RCA stenosis by cath in 2015 b. cath in 08/2018 showing 40% RCA stenosis with no obstructive CAD.   Marland Kitchen Difficult intubation   . DM type 2 (diabetes mellitus, type 2) ([place])   . Gastroesophageal reflux disease   . Heart murmur   . Hypercholesteremia   . Myocardial infarction [facility])    " silent"  . Nephrolithiasis   . Nonischemic cardiomyopathy ([place])    a. EF 20% in 2015 - thought to possibly be viral induced or chemotherapy induced from treatments in 2014 b. at 35-40% by echo in 05/2016   . Peptic ulcer disease    remote  . Ureteral colic   . Wears glasses   . Wears hearing aid in both ears  Past Surgical History:  Procedure Laterality Date  . ANAL FISSURE REPAIR    . APPENDECTOMY    . CARPAL TUNNEL RELEASE Left 10/16/2017   Procedure: LEFT CARPAL TUNNEL RELEASE;  Surgeon: Earlie Server, MD;  Location: [facility];  Service: Orthopedics;  Laterality: Left;  . CATARACT EXTRACTION, BILATERAL    . CERVICAL DISC ARTHROPLASTY N/A 01/05/2018   Procedure: Artificial Cervical Disc ReplacementCervical Three-Four, Cervical Four-Five;  Surgeon: Kristeen Miss, MD;  Location: [place];  Service: Neurosurgery;  Laterality: N/A;  .  CERVICAL FUSION    . CHOLECYSTECTOMY    . COLONOSCOPY N/A 06/27/2015   Procedure: COLONOSCOPY;  Surgeon: Rogene Houston, MD;  [facility];  Service: Endoscopy;  Laterality: N/A;  730  . KNEE SURGERY     x3  . LEFT AND RIGHT HEART CATHETERIZATION WITH CORONARY ANGIOGRAM N/A 12/12/2013   Procedure: LEFT AND RIGHT HEART CATHETERIZATION WITH CORONARY ANGIOGRAM;  Surgeon: [name], MD;  Location: [facility] CATH LAB;  Service: Cardiovascular;  Laterality: N/A;  . LEFT HEART CATH AND CORONARY ANGIOGRAPHY N/A 09/17/2018   Procedure: LEFT HEART CATH AND CORONARY ANGIOGRAPHY;  Surgeon: Wellington Hampshire, MD;  Location: [facility] CV LAB;  Service: Cardiovascular;  Laterality: N/A;  . LUMBAR FUSION    . SHOULDER ACROMIOPLASTY Right 07/18/2015   Procedure: SHOULDER ACROMIOPLASTY;  Surgeon: Earlie Server, MD;  Location: [place];  Service: Orthopedics;  Laterality: Right;  . SHOULDER ARTHROSCOPY Right 07/18/2015   Procedure: Right shoulder arthroscopy with debridement;  Surgeon: Earlie Server, MD;  Location: [facility];  Service: Orthopedics;  Laterality: Right;  . SHOULDER SURGERY     x3  . TONSILLECTOMY      Current Medications: Outpatient Medications Prior to Visit  Medication Sig Dispense Refill  . acetaminophen (TYLENOL) 650 MG CR tablet Take 1,300 mg by mouth every 8 (eight) hours as needed for pain.    Marland Kitchen atorvastatin (LIPITOR) 80 MG tablet Take 80 mg by mouth every evening.     . diazepam (VALIUM) 5 MG tablet Take 5 mg by mouth daily as needed (for vertigo).     Marland Kitchen docusate sodium (COLACE) 100 MG capsule Take 200 mg by mouth at bedtime.    . furosemide (LASIX) 20 MG tablet TAKE 1 TABLET BY MOUTH ONCE DAILY AND  AS  DIRECTED (Patient taking differently: Take 20 mg by mouth daily. ) 90 tablet 3  . INVOKANA 300 MG TABS Take 300 mg by mouth daily.     . pantoprazole (PROTONIX) 40 MG tablet Take 40 mg by mouth daily.     . repaglinide (PRANDIN) 2 MG tablet  Take 2 mg by mouth 2 (two) times daily before a meal.     . sacubitril-valsartan (ENTRESTO) 24-26 MG Take 1 tablet by mouth 2 (two) times daily. 60 tablet 6  . metoprolol succinate (TOPROL-XL) 25 MG 24 hr tablet Take 1 tablet (25 mg total) by mouth daily. 30 tablet 6   No facility-administered medications prior to visit.      Allergies:   Lisinopril and Lisinopril-hydrochlorothiazide   Social History   Socioeconomic History  . Marital status: Married    Spouse name: Not on file  . Number of children: Not on file  . Years of education: Not on file  . Highest education level: Not on file  Occupational History  . Occupation: RETIRED    Comment: [employer]  . Occupation: SECURITY GUARD  Social Needs  . Emergency planning/management officer  strain: Not on file  . Food insecurity:    Worry: Not on file    Inability: Not on file  . Transportation needs:    Medical: Not on file    Non-medical: Not on file  Tobacco Use  . Smoking status: Former Smoker    Packs/day: 2.00    Years: 17.00    Pack years: 34.00    Types: Cigarettes    Start date: 02/17/1968    Last attempt to quit: 02/28/1986    Years since quitting: 32.6  . Smokeless tobacco: Never Used  Substance and Sexual Activity  . Alcohol use: No    Alcohol/week: 0.0 standard drinks  . Drug use: No  . Sexual activity: Yes  Lifestyle  . Physical activity:    Days per week: Not on file    Minutes per session: Not on file  . Stress: Not on file  Relationships  . Social connections:    Talks on phone: Not on file    Gets together: Not on file    Attends religious service: Not on file    Active member of club or organization: Not on file    Attends meetings of clubs or organizations: Not on file    Relationship status: Not on file  Other Topics Concern  . Not on file  Social History Narrative  . Not on file     Family History:  The patient's family history includes Colon cancer in an other family member.   Review of Systems:     Please see the history of present illness.     General:  No chills, fever, night sweats or weight changes.  Cardiovascular:  No chest pain, edema, orthopnea, palpitations, paroxysmal nocturnal dyspnea. Positive for dyspnea on exertion.  Dermatological: No rash, lesions/masses Respiratory: No cough, dyspnea Urologic: No hematuria, dysuria Abdominal:   No nausea, vomiting, diarrhea, bright red blood per rectum, melena, or hematemesis Neurologic:  No visual changes, wkns, changes in mental status. All other systems reviewed and are otherwise negative except as noted above.   Physical Exam:    VS:  BP 128/82   Pulse 96   Ht 5\' 9"  (1.753 m)   Wt 195 lb (88.5 kg)   SpO2 99%   BMI 28.80 kg/m    General: Well developed, well nourished Caucasian male appearing in no acute distress. Head: Normocephalic, atraumatic, sclera non-icteric, no xanthomas, nares are without discharge.  Neck: No carotid bruits. JVD not elevated.  Lungs: Respirations regular and unlabored, without wheezes or rales.  Heart: Regular rate and rhythm. No S3 or S4.  No murmur, no rubs, or gallops appreciated. Abdomen: Soft, non-tender, non-distended with normoactive bowel sounds. No hepatomegaly. No rebound/guarding. No obvious abdominal masses. Msk:  Strength and tone appear normal for age. No joint deformities or effusions. Extremities: No clubbing or cyanosis. No edema.  Distal pedal pulses are 2+ bilaterally. Cath site stable with no ecchymosis or evidence of a hematoma.  Neuro: Alert and oriented X 3. Moves all extremities spontaneously. No focal deficits noted. Psych:  Responds to questions appropriately with a normal affect. Skin: No rashes or lesions noted  Wt Readings from Last 3 Encounters:  10/13/18 195 lb (88.5 kg)  09/17/18 190 lb (86.2 kg)  09/01/18 194 lb (88 kg)     Studies/Labs Reviewed:   EKG:  EKG is not ordered today.    Recent Labs: 09/14/2018: Hemoglobin 17.3; Platelets 240 10/13/2018: BUN 22;  Creatinine, Ser 0.94; Potassium 4.0; Sodium 136  Lipid Panel    Component Value Date/Time   CHOL 223 (H) 12/11/2013 0723   TRIG 122 12/11/2013 0723   HDL 36 (L) 12/11/2013 0723   CHOLHDL 6.2 12/11/2013 0723   VLDL 24 12/11/2013 0723   LDLCALC 163 (H) 12/11/2013 0723    Additional studies/ records that were reviewed today include:   Echocardiogram: 09/02/2018 Study Conclusions  - Left ventricle: The cavity size was normal. Systolic function was   moderately reduced. The estimated ejection fraction was 40%.   Diffuse hypokinesis. Doppler parameters are consistent with   abnormal left ventricular relaxation (grade 1 diastolic   dysfunction). Mild posterior wall thickening. - Aortic valve: There was mild regurgitation.  NST: 09/09/2018  Nonspecific T wave abnormalities in inferolateral leads throughout study.  There was no ST segment deviation noted during stress.  Defect 1: There is a large defect of moderate severity present in the mid anterior, mid anteroseptal, mid inferoseptal, mid inferior, mid inferolateral, apical anterior and apical inferior location.  Findings consistent with prior myocardial infarction with a mild to moderate degree of peri-infarct ischemia.  This is a high risk study.  Nuclear stress EF: 34%.  Cardiac Catheterization: 09/17/2018  There is moderate to severe left ventricular systolic dysfunction.  LV end diastolic pressure is normal.  Prox RCA lesion is 40% stenosed.   1.  Moderate nonobstructive one-vessel coronary artery disease.  The RCA stenosis actually appears to be better than most recent cardiac catheterization in 2015. 2.  Moderately severely reduced LV systolic function with an EF of 30%.  Global hypokinesis. 3.  Mild left ventricular end-diastolic pressure.  Recommendations: The patient has nonischemic cardiomyopathy for which I recommend continuing medical therapy.  He appears to be euvolemic.  Consider switching losartan to  Entresto and adding spironolactone.  Assessment:    1. Chronic combined systolic and diastolic heart failure ([place])   2. NICM (nonischemic cardiomyopathy) ([place])   3. Coronary artery disease involving native coronary artery of native heart with other form of angina pectoris ([place])   4. Essential hypertension   5. Hyperlipidemia LDL goal <70   6. Medication management      Plan:   In order of problems listed above:  1. Chronic Combined Systolic and Diastolic CHF/ NICM - The patient has a known reduced EF dating back to 2015 which was 20% at that time. Ejection fraction was at 40% by most recent echocardiogram and repeat catheterization showed nonobstructive disease.  His cardiomyopathy is thought to be possibly be chemotherapy or viral induced given his prior leukemia treatment. - He is still having dyspnea on exertion but has experienced improvement in his symptoms. Weights have been stable on his home scales. Appears euvolemic by examination today.   - He is currently on Toprol-XL 25 mg daily and Entresto 24-26 mg twice daily. Repeat labs were not obtained following his switch to [facility] and will draw these today. His BP has been low-normal when checked at home, therefore will plan to attempt to titrate Toprol-XL to 37.5 mg daily and have him take this in the AM hours. I am unsure if he will be able to tolerate a higher dose of Entresto given his recent BP readings.  2. CAD - recent cardiac catheterization showed 40% RCA stenosis as outlined above with continued medical management recommended. He is still having dyspnea on exertion but denies any recurrent chest pain.  Reviewed catheterization report in detail with the patient and his wife today. - Remains on ASA, beta-blocker, and statin  therapy.  3. HTN - BP is well controlled at 128/82 during today's visit. He is currently on Toprol-XL 25 mg daily and Entresto 24-26 mg twice daily. Will plan to titrate Toprol-XL to 37.5 mg daily as  outlined above.  4. HLD - Followed by PCP. Goal LDL is less than 70 in the setting of known CAD. He remains on Atorvastatin 80 mg daily.   Medication Adjustments/Labs and Tests Ordered: Current medicines are reviewed at length with the patient today.  Concerns regarding medicines are outlined above.  Medication changes, Labs and Tests ordered today are listed in the Patient Instructions below. Patient Instructions  Medication Instructions:  Your physician has recommended you make the following change in your medication:  Increase Toprol XL 37.5 mg  Daily   If you need a refill on your cardiac medications before your next appointment, please call your pharmacy.   Lab work: Your physician recommends that you return for lab work in: Today   If you have labs (blood work) drawn today and your tests are completely normal, you will receive your results only by: Marland Kitchen MyChart Message (if you have MyChart) OR . A paper copy in the mail If you have any lab test that is abnormal or we need to change your treatment, we will call you to review the results.  Testing/Procedures: NONE   Follow-Up: At [facility], you and your health needs are our priority.  As part of our continuing mission to provide you with exceptional heart care, we have created designated Provider Care Teams.  These Care Teams include your primary Cardiologist (physician) and Advanced Practice Providers (APPs -  Physician Assistants and Nurse Practitioners) who all work together to provide you with the care you need, when you need it. You will need a follow up appointment in 2-3 months.  Please call our office 2 months in advance to schedule this appointment.  You may see Kate Sable, MD or one of the following Advanced Practice Providers on your designated Care Team:   Bernerd Pho, PA-C [facility]) . Ermalinda Barrios, PA-C ([place])  Any Other Special Instructions Will Be Listed Below (If  Applicable). Thank you for choosing [facility]!    Signed, Erma Heritage, PA-C  10/13/2018 5:00 PM    [place] [address] [place], [place] [address] Phone: [phone] Fax: [phone]

## 2018-10-13 NOTE — Patient Instructions (Addendum)
Medication Instructions:  Your physician has recommended you make the following change in your medication:  Increase Toprol XL 37.5 mg  Daily   If you need a refill on your cardiac medications before your next appointment, please call your pharmacy.   Lab work: Your physician recommends that you return for lab work in: Today   If you have labs (blood work) drawn today and your tests are completely normal, you will receive your results only by: Marland Kitchen MyChart Message (if you have MyChart) OR . A paper copy in the mail If you have any lab test that is abnormal or we need to change your treatment, we will call you to review the results.  Testing/Procedures: NONE   Follow-Up: At [facility], you and your health needs are our priority.  As part of our continuing mission to provide you with exceptional heart care, we have created designated Provider Care Teams.  These Care Teams include your primary Cardiologist (physician) and Advanced Practice Providers (APPs -  Physician Assistants and Nurse Practitioners) who all work together to provide you with the care you need, when you need it. You will need a follow up appointment in 2-3 months.  Please call our office 2 months in advance to schedule this appointment.  You may see Kate Sable, MD or one of the following Advanced Practice Providers on your designated Care Team:   Bernerd Pho, PA-C [facility]) . Ermalinda Barrios, PA-C ([place])  Any Other Special Instructions Will Be Listed Below (If Applicable). Thank you for choosing [facility]!

## 2018-10-13 NOTE — Telephone Encounter (Signed)
Called patient with test results. No answer. Left message to call back.  

## 2018-11-16 ENCOUNTER — Telehealth: Payer: Self-pay | Admitting: Student

## 2018-11-16 MED ORDER — METOPROLOL SUCCINATE ER 50 MG PO TB24: 50.0000 mg | ORAL_TABLET | Freq: Every day | ORAL | 3 refills | Status: DC

## 2018-11-16 MED ORDER — SACUBITRIL-VALSARTAN 24-26 MG PO TABS: 1.0000 | ORAL_TABLET | Freq: Two times a day (BID) | ORAL | 3 refills | Status: DC

## 2018-11-16 NOTE — Telephone Encounter (Signed)
Spoke with pt and wife regarding increasing Toprol XL. Pt states that he was told during last office visit that Toprol XL was only being increased to 37.5 mg d/t his blood pressure. Pt reports that BP's have been running around 125/68 with HR's around 80-102. He would like to know if his Toprol XL can be increased to 50 mg daily so he only has to take the one pill. Please advise.

## 2018-11-16 NOTE — Telephone Encounter (Signed)
Patient is wanting to speak with nurse regarding medications/tg

## 2018-11-16 NOTE — Telephone Encounter (Signed)
Pt notified and voiced understanding 

## 2018-11-16 NOTE — Telephone Encounter (Signed)
   Yes, he is good to increase to Toprol-XL 50mg  daily. We prefer a higher dose if vitals allow given his cardiomyopathy. Would continue to follow HR and BP with these changes.   Signed, Erma Heritage, PA-C 11/16/2018, 4:08 PM Pager: [phone]

## 2018-11-16 NOTE — Telephone Encounter (Signed)
Returned pt call. No answer, left msg to call back.  °

## 2018-12-09 ENCOUNTER — Telehealth: Payer: Self-pay | Admitting: *Deleted

## 2018-12-09 NOTE — Telephone Encounter (Signed)
Patient verbally consented for telehealth visits with [facility] and understands that his insurance company will be billed for the encounter.   Aware to have vitals available 

## 2018-12-13 ENCOUNTER — Telehealth (INDEPENDENT_AMBULATORY_CARE_PROVIDER_SITE_OTHER): Payer: Medicare Other | Admitting: Cardiovascular Disease

## 2018-12-13 ENCOUNTER — Encounter: Payer: Self-pay | Admitting: Cardiovascular Disease

## 2018-12-13 VITALS — BP 130/70 | HR 97 | Ht 69.0 in | Wt 188.2 lb

## 2018-12-13 DIAGNOSIS — E785 Hyperlipidemia, unspecified: Secondary | ICD-10-CM

## 2018-12-13 DIAGNOSIS — I10 Essential (primary) hypertension: Secondary | ICD-10-CM

## 2018-12-13 DIAGNOSIS — I25118 Atherosclerotic heart disease of native coronary artery with other forms of angina pectoris: Secondary | ICD-10-CM

## 2018-12-13 DIAGNOSIS — I5042 Chronic combined systolic (congestive) and diastolic (congestive) heart failure: Secondary | ICD-10-CM

## 2018-12-13 DIAGNOSIS — I428 Other cardiomyopathies: Secondary | ICD-10-CM

## 2018-12-13 NOTE — Patient Instructions (Signed)
Medication Instructions:  Continue all current medications.  Labwork: none  Testing/Procedures: none  Follow-Up: 4 months   Any Other Special Instructions Will Be Listed Below (If Applicable).  If you need a refill on your cardiac medications before your next appointment, please call your pharmacy.\ 

## 2018-12-13 NOTE — Progress Notes (Signed)
Virtual Visit via Video Note   This visit type was conducted due to national recommendations for restrictions regarding the COVID-19 Pandemic (e.g. social distancing) in an effort to limit this patient's exposure and mitigate transmission in our community.  Due to his co-morbid illnesses, this patient is at least at moderate risk for complications without adequate follow up.  This format is felt to be most appropriate for this patient at this time.  All issues noted in this document were discussed and addressed.  A limited physical exam was performed with this format.  Please refer to the patient's chart for his consent to telehealth for [facility].   Date:  12/13/2018   ID:  Ricardo Zuniga, DOB September 03, 1950, MRN [id number]  Patient Location: Home Provider Location: Home  PCP:  Glenda Chroman, MD  Cardiologist:  Kate Sable, MD  Electrophysiologist:  None   Evaluation Performed:  Follow-Up Visit  Chief Complaint:  Cardiomyopathy  History of Present Illness:    Ricardo Zuniga is a 68 y.o. male with a nonischemic cardiomyopathy, LVEF 40% by echo on 09/02/18.  He denies chest pain, exertional dyspnea, leg swelling, orthopnea, and PND.  He said his resting HR can be in the 90 bpm range in the mornings. He denies palpitations. He just started taking the higher dose of beta blocker last week.  He had been struggling with a dry cough until his PCP prescribed Zyrtec and this has since resolved.  The patient does not have symptoms concerning for COVID-19 infection (fever, chills, cough, or new shortness of breath).    Past Medical History:  Diagnosis Date  . Anal fissure   . APML (acute promyelocytic leukemia) in remission ([place])    completed treatment 04/2013  . Coronary artery disease    a. 50-60% RCA stenosis by cath in 2015 b. cath in 08/2018 showing 40% RCA stenosis with no obstructive CAD.   Marland Kitchen Difficult intubation   . DM type 2 (diabetes mellitus, type 2) ([place])   .  Gastroesophageal reflux disease   . Heart murmur   . Hypercholesteremia   . Myocardial infarction [facility])    " silent"  . Nephrolithiasis   . Nonischemic cardiomyopathy ([place])    a. EF 20% in 2015 - thought to possibly be viral induced or chemotherapy induced from treatments in 2014 b. at 35-40% by echo in 05/2016   . Peptic ulcer disease    remote  . Ureteral colic   . Wears glasses   . Wears hearing aid in both ears    Past Surgical History:  Procedure Laterality Date  . ANAL FISSURE REPAIR    . APPENDECTOMY    . CARPAL TUNNEL RELEASE Left 10/16/2017   Procedure: LEFT CARPAL TUNNEL RELEASE;  Surgeon: Earlie Server, MD;  Location: [place];  Service: Orthopedics;  Laterality: Left;  . CATARACT EXTRACTION, BILATERAL    . CERVICAL DISC ARTHROPLASTY N/A 01/05/2018   Procedure: Artificial Cervical Disc ReplacementCervical Three-Four, Cervical Four-Five;  Surgeon: Kristeen Miss, MD;  Location: [place];  Service: Neurosurgery;  Laterality: N/A;  . CERVICAL FUSION    . CHOLECYSTECTOMY    . COLONOSCOPY N/A 06/27/2015   Procedure: COLONOSCOPY;  Surgeon: Rogene Houston, MD;  [facility];  Service: Endoscopy;  Laterality: N/A;  730  . KNEE SURGERY     x3  . LEFT AND RIGHT HEART CATHETERIZATION WITH CORONARY ANGIOGRAM N/A 12/12/2013   Procedure: LEFT AND RIGHT HEART CATHETERIZATION WITH CORONARY ANGIOGRAM;  Surgeon: Shanon Brow  Loren Racer, MD;  Location: [facility] CATH LAB;  Service: Cardiovascular;  Laterality: N/A;  . LEFT HEART CATH AND CORONARY ANGIOGRAPHY N/A 09/17/2018   Procedure: LEFT HEART CATH AND CORONARY ANGIOGRAPHY;  Surgeon: Wellington Hampshire, MD;  Location: [place] CV LAB;  Service: Cardiovascular;  Laterality: N/A;  . LUMBAR FUSION    . SHOULDER ACROMIOPLASTY Right 07/18/2015   Procedure: SHOULDER ACROMIOPLASTY;  Surgeon: Earlie Server, MD;  Location: [place];  Service: Orthopedics;  Laterality: Right;  . SHOULDER ARTHROSCOPY Right 07/18/2015   Procedure: Right  shoulder arthroscopy with debridement;  Surgeon: Earlie Server, MD;  Location: [place];  Service: Orthopedics;  Laterality: Right;  . SHOULDER SURGERY     x3  . TONSILLECTOMY       Current Meds  Medication Sig  . acetaminophen (TYLENOL) 650 MG CR tablet Take 1,300 mg by mouth every 8 (eight) hours as needed for pain.  Marland Kitchen atorvastatin (LIPITOR) 80 MG tablet Take 80 mg by mouth every evening.   . diazepam (VALIUM) 5 MG tablet Take 5 mg by mouth daily as needed (for vertigo).   Marland Kitchen docusate sodium (COLACE) 100 MG capsule Take 200 mg by mouth at bedtime.  . furosemide (LASIX) 20 MG tablet TAKE 1 TABLET BY MOUTH ONCE DAILY AND  AS  DIRECTED (Patient taking differently: Take 20 mg by mouth daily. )  . INVOKANA 300 MG TABS Take 300 mg by mouth daily.   . metoprolol succinate (TOPROL-XL) 50 MG 24 hr tablet Take 1 tablet (50 mg total) by mouth daily.  . pantoprazole (PROTONIX) 40 MG tablet Take 40 mg by mouth daily.   . repaglinide (PRANDIN) 2 MG tablet Take 2 mg by mouth 2 (two) times daily before a meal.   . sacubitril-valsartan (ENTRESTO) 24-26 MG Take 1 tablet by mouth 2 (two) times daily.     Allergies:   Lisinopril and Lisinopril-hydrochlorothiazide   Social History   Tobacco Use  . Smoking status: Former Smoker    Packs/day: 2.00    Years: 17.00    Pack years: 34.00    Types: Cigarettes    Start date: 02/17/1968    Last attempt to quit: 02/28/1986    Years since quitting: 32.8  . Smokeless tobacco: Never Used  Substance Use Topics  . Alcohol use: No    Alcohol/week: 0.0 standard drinks  . Drug use: No     Family Hx: The patient's family history includes Colon cancer in an other family member.  ROS:   Please see the history of present illness.     All other systems reviewed and are negative.   Prior CV studies:   The following studies were reviewed today:  Echocardiogram: 09/02/2018 Study Conclusions  - Left ventricle: The cavity size was normal.  Systolic function was moderately reduced. The estimated ejection fraction was 40%. Diffuse hypokinesis. Doppler parameters are consistent with abnormal left ventricular relaxation (grade 1 diastolic dysfunction). Mild posterior wall thickening. - Aortic valve: There was mild regurgitation.  NST: 09/09/2018  Nonspecific T wave abnormalities in inferolateral leads throughout study.  There was no ST segment deviation noted during stress.  Defect 1: There is a large defect of moderate severity present in the mid anterior, mid anteroseptal, mid inferoseptal, mid inferior, mid inferolateral, apical anterior and apical inferior location.  Findings consistent with prior myocardial infarction with a mild to moderate degree of peri-infarct ischemia.  This is a high risk study.  Nuclear stress EF: 34%.  Cardiac  Catheterization: 09/17/2018  There is moderate to severe left ventricular systolic dysfunction.  LV end diastolic pressure is normal.  Prox RCA lesion is 40% stenosed.  1. Moderate nonobstructive one-vessel coronary artery disease. The RCA stenosis actually appears to be better than most recent cardiac catheterization in 2015. 2. Moderately severely reduced LV systolic function with an EF of 30%. Global hypokinesis. 3. Mild left ventricular end-diastolic pressure.  Recommendations: The patient has nonischemic cardiomyopathy for which I recommend continuing medical therapy. He appears to be euvolemic. Consider switching losartan to Entresto and adding spironolactone.  Labs/Other Tests and Data Reviewed:    EKG:  No ECG reviewed.  Recent Labs: 09/14/2018: Hemoglobin 17.3; Platelets 240 10/13/2018: BUN 22; Creatinine, Ser 0.94; Potassium 4.0; Sodium 136   Recent Lipid Panel Lab Results  Component Value Date/Time   CHOL 223 (H) 12/11/2013 07:23 AM   TRIG 122 12/11/2013 07:23 AM   HDL 36 (L) 12/11/2013 07:23 AM   CHOLHDL 6.2 12/11/2013 07:23 AM   LDLCALC 163 (H)  12/11/2013 07:23 AM    Wt Readings from Last 3 Encounters:  12/13/18 188 lb 3.2 oz (85.4 kg)  10/13/18 195 lb (88.5 kg)  09/17/18 190 lb (86.2 kg)     Objective:    Vital Signs:  BP 130/70   Pulse 97   Ht 5\' 9"  (1.753 m)   Wt 188 lb 3.2 oz (85.4 kg)   SpO2 97%   BMI 27.79 kg/m    VITAL SIGNS:  reviewed GEN:  no acute distress EYES:  sclerae anicteric, EOMI - Extraocular Movements Intact RESPIRATORY:  normal respiratory effort, symmetric expansion NEURO:  alert and oriented x 3, no obvious focal deficit PSYCH:  normal affect  ASSESSMENT & PLAN:    1. Chronic combined CHF/nonischemic cardiomyopathy: LVEF 40%. Symptomatically stable. Continue Toprol-XL 50 mg daily and Entresto 24-26 mg bid. Takes Lasix 20 mg daily.  2. CAD: Non-obstructive. Continue medical management with ASA, Toprol-XL, and statin.  3. HTN: BP is normal. No changes to therapy.  4. HLD: Continue atorvastatin 80 mg.   COVID-19 Education: The signs and symptoms of COVID-19 were discussed with the patient and how to seek care for testing (follow up with PCP or arrange E-visit).  The importance of social distancing was discussed today.  Time:   Today, I have spent 15 minutes with the patient with telehealth technology discussing the above problems.     Medication Adjustments/Labs and Tests Ordered: Current medicines are reviewed at length with the patient today.  Concerns regarding medicines are outlined above.   Tests Ordered: No orders of the defined types were placed in this encounter.   Medication Changes: No orders of the defined types were placed in this encounter.   Disposition:  Follow up in 4 month(s)  Signed, Kate Sable, MD  12/13/2018 2:46 PM    [place]

## 2019-01-13 ENCOUNTER — Other Ambulatory Visit: Payer: Self-pay

## 2019-01-13 ENCOUNTER — Ambulatory Visit (HOSPITAL_COMMUNITY)
Admission: RE | Admit: 2019-01-13 | Discharge: 2019-01-13 | Disposition: A | Discharge status: Home / Self Care | Payer: Medicare Other | Source: Ambulatory Visit | Attending: Internal Medicine | Admitting: Internal Medicine

## 2019-01-13 ENCOUNTER — Other Ambulatory Visit: Payer: Medicare Other

## 2019-01-13 ENCOUNTER — Other Ambulatory Visit (HOSPITAL_COMMUNITY): Payer: Self-pay | Admitting: Student

## 2019-01-13 ENCOUNTER — Other Ambulatory Visit (HOSPITAL_COMMUNITY)
Admission: RE | Admit: 2019-01-13 | Discharge: 2019-01-13 | Disposition: A | Discharge status: Home / Self Care | Payer: Medicare Other | Source: Ambulatory Visit | Attending: Internal Medicine | Admitting: Internal Medicine

## 2019-01-13 DIAGNOSIS — Z20822 Contact with and (suspected) exposure to covid-19: Secondary | ICD-10-CM

## 2019-01-13 DIAGNOSIS — R0789 Other chest pain: Secondary | ICD-10-CM

## 2019-01-13 LAB — BASIC METABOLIC PANEL
Anion gap: 13 (ref 5–15)
BUN: 14 mg/dL (ref 8–23)
CO2: 21 mmol/L — ABNORMAL LOW (ref 22–32)
Calcium: 8.9 mg/dL (ref 8.9–10.3)
Chloride: 103 mmol/L (ref 98–111)
Creatinine, Ser: 1.13 mg/dL (ref 0.61–1.24)
GFR calc Af Amer: >60 mL/min (ref >60)
GFR calc non Af Amer: >60 mL/min (ref >60)
Glucose, Bld: 197 mg/dL — ABNORMAL HIGH (ref 70–99)
Potassium: 3.6 mmol/L (ref 3.5–5.1)
Sodium: 137 mmol/L (ref 135–145)

## 2019-01-13 LAB — CBC
HCT: 44.8 % (ref 39.0–52.0)
Hemoglobin: 16 g/dL (ref 13.0–17.0)
MCH: 32.5 pg (ref 26.0–34.0)
MCHC: 35.7 g/dL (ref 30.0–36.0)
MCV: 91.1 fL (ref 80.0–100.0)
Platelets: 260 10*3/uL (ref 150–400)
RBC: 4.92 MIL/uL (ref 4.22–5.81)
RDW: 12.7 % (ref 11.5–15.5)
WBC: 11.6 10*3/uL — ABNORMAL HIGH (ref 4.0–10.5)
nRBC: 0 % (ref 0.0–0.2)

## 2019-01-13 LAB — D-DIMER, QUANTITATIVE: D-Dimer, Quant: <0.27 ug/mL-FEU (ref 0.00–0.50)

## 2019-01-16 LAB — NOVEL CORONAVIRUS, NAA: SARS-CoV-2, NAA: NOT DETECTED

## 2019-02-21 ENCOUNTER — Other Ambulatory Visit: Payer: Self-pay

## 2019-02-21 DIAGNOSIS — Z20822 Contact with and (suspected) exposure to covid-19: Secondary | ICD-10-CM

## 2019-02-25 LAB — NOVEL CORONAVIRUS, NAA: SARS-CoV-2, NAA: NOT DETECTED

## 2019-04-19 ENCOUNTER — Other Ambulatory Visit: Payer: Self-pay

## 2019-04-19 ENCOUNTER — Ambulatory Visit (INDEPENDENT_AMBULATORY_CARE_PROVIDER_SITE_OTHER): Payer: Medicare Other | Admitting: Cardiovascular Disease

## 2019-04-19 ENCOUNTER — Encounter: Payer: Self-pay | Admitting: Cardiovascular Disease

## 2019-04-19 VITALS — BP 118/60 | HR 82 | Temp 99.0°F | Ht 67.0 in | Wt 190.0 lb

## 2019-04-19 DIAGNOSIS — E785 Hyperlipidemia, unspecified: Secondary | ICD-10-CM

## 2019-04-19 DIAGNOSIS — I10 Essential (primary) hypertension: Secondary | ICD-10-CM | POA: Diagnosis not present

## 2019-04-19 DIAGNOSIS — I25118 Atherosclerotic heart disease of native coronary artery with other forms of angina pectoris: Secondary | ICD-10-CM

## 2019-04-19 DIAGNOSIS — I428 Other cardiomyopathies: Secondary | ICD-10-CM

## 2019-04-19 DIAGNOSIS — I5042 Chronic combined systolic (congestive) and diastolic (congestive) heart failure: Secondary | ICD-10-CM | POA: Diagnosis not present

## 2019-04-19 NOTE — Patient Instructions (Signed)

## 2019-04-19 NOTE — Progress Notes (Signed)
SUBJECTIVE: The patient presents for routine follow-up.  He has a nonischemic cardiomyopathy, LVEF 40% by echo on 09/02/18.  The patient denies any symptoms of chest pain, palpitations, shortness of breath, lightheadedness, dizziness, leg swelling, orthopnea, PND, and syncope.  He was diagnosed with walking pneumonia in June and has a residual cough from that but he denies fevers and it is nonproductive.  He recently went on a family trip to [place].  He has been very meticulous about self quarantining.  I also spoke with his wife, [name], by phone.  He recently had some elevations in his liver transaminases and she had questions about reducing atorvastatin dose.  He prefers to take 80 mg.     Review of Systems: As per "subjective", otherwise negative.  Allergies  Allergen Reactions  . Lisinopril Cough  . Lisinopril-Hydrochlorothiazide Cough    Current Outpatient Medications  Medication Sig Dispense Refill  . acetaminophen (TYLENOL) 650 MG CR tablet Take 1,300 mg by mouth every 8 (eight) hours as needed for pain.    Marland Kitchen atorvastatin (LIPITOR) 80 MG tablet Take 80 mg by mouth every evening.     . diazepam (VALIUM) 5 MG tablet Take 5 mg by mouth daily as needed (for vertigo).     Marland Kitchen docusate sodium (COLACE) 100 MG capsule Take 200 mg by mouth at bedtime.    . furosemide (LASIX) 20 MG tablet TAKE 1 TABLET BY MOUTH ONCE DAILY AND  AS  DIRECTED (Patient taking differently: Take 20 mg by mouth daily. ) 90 tablet 3  . INVOKANA 300 MG TABS Take 300 mg by mouth daily.     . metoprolol succinate (TOPROL-XL) 50 MG 24 hr tablet Take 1 tablet (50 mg total) by mouth daily. 90 tablet 3  . pantoprazole (PROTONIX) 40 MG tablet Take 40 mg by mouth daily.     . repaglinide (PRANDIN) 2 MG tablet Take 2 mg by mouth 2 (two) times daily before a meal.     . sacubitril-valsartan (ENTRESTO) 24-26 MG Take 1 tablet by mouth 2 (two) times daily. 180 tablet 3   No current facility-administered  medications for this visit.     Past Medical History:  Diagnosis Date  . Anal fissure   . APML (acute promyelocytic leukemia) in remission ([place])    completed treatment 04/2013  . Coronary artery disease    a. 50-60% RCA stenosis by cath in 2015 b. cath in 08/2018 showing 40% RCA stenosis with no obstructive CAD.   Marland Kitchen Difficult intubation   . DM type 2 (diabetes mellitus, type 2) ([place])   . Gastroesophageal reflux disease   . Heart murmur   . Hypercholesteremia   . Myocardial infarction [facility])    " silent"  . Nephrolithiasis   . Nonischemic cardiomyopathy ([place])    a. EF 20% in 2015 - thought to possibly be viral induced or chemotherapy induced from treatments in 2014 b. at 35-40% by echo in 05/2016   . Peptic ulcer disease    remote  . Ureteral colic   . Wears glasses   . Wears hearing aid in both ears     Past Surgical History:  Procedure Laterality Date  . ANAL FISSURE REPAIR    . APPENDECTOMY    . CARPAL TUNNEL RELEASE Left 10/16/2017   Procedure: LEFT CARPAL TUNNEL RELEASE;  Surgeon: Earlie Server, MD;  Location: [place];  Service: Orthopedics;  Laterality: Left;  . CATARACT EXTRACTION, BILATERAL    . CERVICAL DISC  ARTHROPLASTY N/A 01/05/2018   Procedure: Artificial Cervical Disc ReplacementCervical Three-Four, Cervical Four-Five;  Surgeon: Kristeen Miss, MD;  Location: [place];  Service: Neurosurgery;  Laterality: N/A;  . CERVICAL FUSION    . CHOLECYSTECTOMY    . COLONOSCOPY N/A 06/27/2015   Procedure: COLONOSCOPY;  Surgeon: Rogene Houston, MD;  [facility];  Service: Endoscopy;  Laterality: N/A;  730  . KNEE SURGERY     x3  . LEFT AND RIGHT HEART CATHETERIZATION WITH CORONARY ANGIOGRAM N/A 12/12/2013   Procedure: LEFT AND RIGHT HEART CATHETERIZATION WITH CORONARY ANGIOGRAM;  Surgeon: [name], MD;  Location: [facility] CATH LAB;  Service: Cardiovascular;  Laterality: N/A;  . LEFT HEART CATH AND CORONARY ANGIOGRAPHY N/A 09/17/2018   Procedure: LEFT HEART CATH AND  CORONARY ANGIOGRAPHY;  Surgeon: Wellington Hampshire, MD;  Location: [place] CV LAB;  Service: Cardiovascular;  Laterality: N/A;  . LUMBAR FUSION    . SHOULDER ACROMIOPLASTY Right 07/18/2015   Procedure: SHOULDER ACROMIOPLASTY;  Surgeon: Earlie Server, MD;  Location: [place];  Service: Orthopedics;  Laterality: Right;  . SHOULDER ARTHROSCOPY Right 07/18/2015   Procedure: Right shoulder arthroscopy with debridement;  Surgeon: Earlie Server, MD;  Location: [place];  Service: Orthopedics;  Laterality: Right;  . SHOULDER SURGERY     x3  . TONSILLECTOMY      Social History   Socioeconomic History  . Marital status: Married    Spouse name: Not on file  . Number of children: Not on file  . Years of education: Not on file  . Highest education level: Not on file  Occupational History  . Occupation: RETIRED    Comment: [employer]  . Occupation: SECURITY GUARD  Social Needs  . Financial resource strain: Not on file  . Food insecurity    Worry: Not on file    Inability: Not on file  . Transportation needs    Medical: Not on file    Non-medical: Not on file  Tobacco Use  . Smoking status: Former Smoker    Packs/day: 2.00    Years: 17.00    Pack years: 34.00    Types: Cigarettes    Start date: 02/17/1968    Quit date: 02/28/1986    Years since quitting: 33.1  . Smokeless tobacco: Never Used  Substance and Sexual Activity  . Alcohol use: No    Alcohol/week: 0.0 standard drinks  . Drug use: No  . Sexual activity: Yes  Lifestyle  . Physical activity    Days per week: Not on file    Minutes per session: Not on file  . Stress: Not on file  Relationships  . Social Herbalist on phone: Not on file    Gets together: Not on file    Attends religious service: Not on file    Active member of club or organization: Not on file    Attends meetings of clubs or organizations: Not on file    Relationship status: Not on file  . Intimate  partner violence    Fear of current or ex partner: Not on file    Emotionally abused: Not on file    Physically abused: Not on file    Forced sexual activity: Not on file  Other Topics Concern  . Not on file  Social History Narrative  . Not on file     Vitals:   04/19/19 1258  BP: 118/60  Pulse: 82  Temp: 99  F (37.2 C)  SpO2: 98%  Weight: 190 lb (86.2 kg)  Height: 5\' 7"  (1.702 m)    Wt Readings from Last 3 Encounters:  04/19/19 190 lb (86.2 kg)  12/13/18 188 lb 3.2 oz (85.4 kg)  10/13/18 195 lb (88.5 kg)     PHYSICAL EXAM General: NAD HEENT: Normal. Neck: No JVD, no thyromegaly. Lungs: Clear to auscultation bilaterally with normal respiratory effort. CV: Regular rate and rhythm, normal S1/S2, no S3/S4, no murmur. No pretibial or periankle edema.  No carotid bruit.   Abdomen: Soft, nontender, no distention.  Neurologic: Alert and oriented.  Psych: Normal affect. Skin: Normal. Musculoskeletal: No gross deformities.      Labs: Lab Results  Component Value Date/Time   K 3.6 01/13/2019 02:04 PM   BUN 14 01/13/2019 02:04 PM   CREATININE 1.13 01/13/2019 02:04 PM   CREATININE 0.84 09/14/2018 09:46 AM   ALT 22 12/09/2013 07:41 AM   TSH 1.113 05/16/2016 04:15 PM   TSH 0.692 08/16/2012 03:20 PM   HGB 16.0 01/13/2019 02:04 PM   HGB 13.1 09/03/2012 08:41 AM     Lipids: Lab Results  Component Value Date/Time   LDLCALC 163 (H) 12/11/2013 07:23 AM   CHOL 223 (H) 12/11/2013 07:23 AM   TRIG 122 12/11/2013 07:23 AM   HDL 36 (L) 12/11/2013 07:23 AM      Prior CV studies:   The following studies were reviewed today:  Echocardiogram: 09/02/2018 Study Conclusions  - Left ventricle: The cavity size was normal. Systolic function was moderately reduced. The estimated ejection fraction was 40%. Diffuse hypokinesis. Doppler parameters are consistent with abnormal left ventricular relaxation (grade 1 diastolic dysfunction). Mild posterior wall thickening.  - Aortic valve: There was mild regurgitation.  NST: 09/09/2018  Nonspecific T wave abnormalities in inferolateral leads throughout study.  There was no ST segment deviation noted during stress.  Defect 1: There is a large defect of moderate severity present in the mid anterior, mid anteroseptal, mid inferoseptal, mid inferior, mid inferolateral, apical anterior and apical inferior location.  Findings consistent with prior myocardial infarction with a mild to moderate degree of peri-infarct ischemia.  This is a high risk study.  Nuclear stress EF: 34%.  Cardiac Catheterization: 09/17/2018  There is moderate to severe left ventricular systolic dysfunction.  LV end diastolic pressure is normal.  Prox RCA lesion is 40% stenosed.  1. Moderate nonobstructive one-vessel coronary artery disease. The RCA stenosis actually appears to be better than most recent cardiac catheterization in 2015. 2. Moderately severely reduced LV systolic function with an EF of 30%. Global hypokinesis. 3. Mild left ventricular end-diastolic pressure.  Recommendations: The patient has nonischemic cardiomyopathy for which I recommend continuing medical therapy. He appears to be euvolemic. Consider switching losartan to Entresto and adding spironolactone.    ASSESSMENT AND PLAN: 1. Chronic combined CHF/nonischemic cardiomyopathy: LVEF 40%. Symptomatically stable. Continue Toprol-XL 50 mg daily and Entresto 24-26 mg bid. Takes Lasix 20 mg daily.  2. CAD: Non-obstructive. Continue medical management with ASA, Toprol-XL, and statin.  3. HTN: BP is normal. No changes to therapy.  4. HLD: Continue atorvastatin.  I told him he could reduce the dose to 40 mg and repeat LFTs.    Disposition: Follow up 6 months   Kate Sable, M.D., F.A.C.C.

## 2019-04-28 ENCOUNTER — Other Ambulatory Visit (HOSPITAL_COMMUNITY): Payer: Self-pay | Admitting: Student

## 2019-04-28 ENCOUNTER — Other Ambulatory Visit (HOSPITAL_COMMUNITY): Payer: Self-pay | Admitting: Internal Medicine

## 2019-04-28 DIAGNOSIS — D751 Secondary polycythemia: Secondary | ICD-10-CM

## 2019-05-11 ENCOUNTER — Other Ambulatory Visit: Payer: Self-pay

## 2019-05-11 ENCOUNTER — Ambulatory Visit (HOSPITAL_COMMUNITY)
Admission: RE | Admit: 2019-05-11 | Discharge: 2019-05-11 | Disposition: A | Discharge status: Home / Self Care | Payer: Medicare Other | Source: Ambulatory Visit | Attending: Internal Medicine | Admitting: Internal Medicine

## 2019-05-11 DIAGNOSIS — D751 Secondary polycythemia: Secondary | ICD-10-CM | POA: Diagnosis not present

## 2019-05-11 DIAGNOSIS — N200 Calculus of kidney: Secondary | ICD-10-CM | POA: Insufficient documentation

## 2019-05-11 LAB — POCT I-STAT CREATININE: Creatinine, Ser: 0.7 mg/dL (ref 0.61–1.24)

## 2019-05-11 MED ORDER — IOHEXOL 300 MG/ML  SOLN
100.0000 mL | Freq: Once | INTRAMUSCULAR | Status: AC | PRN
  Administered 2019-05-11: 100 mL via INTRAVENOUS

## 2019-07-13 ENCOUNTER — Other Ambulatory Visit: Payer: Self-pay | Admitting: Cardiovascular Disease

## 2019-09-26 ENCOUNTER — Telehealth: Payer: Self-pay

## 2019-09-26 NOTE — Telephone Encounter (Signed)
Virtual Visit Pre-Appointment Phone Call  "(Name), I am calling you today to discuss your upcoming appointment. We are currently trying to limit exposure to the virus that causes COVID-19 by seeing patients at home rather than in the office."  1. "What is the BEST phone number to call the day of the visit?" - include this in appointment notes  2. "Do you have or have access to (through a family member/friend) a smartphone with video capability that we can use for your visit?" a. If yes - list this number in appt notes as "cell" (if different from BEST phone #) and list the appointment type as a VIDEO visit in appointment notes b. If no - list the appointment type as a PHONE visit in appointment notes  Confirm consent - "In the setting of the current Covid19 crisis, you are scheduled for a (phone or video) visit with your provider on (date) at (time).  Just as we do with many in-office visits, in order for you to participate in this visit, we must obtain consent.  If you'd like, I can send this to your mychart (if signed up) or email for you to review.  Otherwise, I can obtain your verbal consent now.  All virtual visits are billed to your insurance company just like a normal visit would be.  By agreeing to a virtual visit, we'd like you to understand that the technology does not allow for your provider to perform an examination, and thus may limit your provider's ability to fully assess your condition. If your provider identifies any concerns that need to be evaluated in person, we will make arrangements to do so.  Finally, though the technology is pretty good, we cannot assure that it will always work on either your or our end, and in the setting of a video visit, we may have to convert it to a phone-only visit.  In either situation, we cannot ensure that we have a secure connection.  Are you willing to proceed?" STAFF: Did the patient verbally acknowledge consent to telehealth visit? Document  YES/NO here:  3. Advise patient to be prepared - "Two hours prior to your appointment, go ahead and check your blood pressure, pulse, oxygen saturation, and your weight (if you have the equipment to check those) and write them all down. When your visit starts, your provider will ask you for this information. If you have an Apple Watch or Kardia device, please plan to have heart rate information ready on the day of your appointment. Please have a pen and paper handy nearby the day of the visit as well."  4. Give patient instructions for MyChart download to smartphone OR Doximity/Doxy.me as below if video visit (depending on what platform provider is using)  5. Inform patient they will receive a phone call 15 minutes prior to their appointment time (may be from unknown caller ID) so they should be prepared to answer    TELEPHONE CALL NOTE  VINCIL EELLS has been deemed a candidate for a follow-up tele-health visit to limit community exposure during the Covid-19 pandemic. I spoke with the patient via phone to ensure availability of phone/video source, confirm preferred email & phone number, and discuss instructions and expectations.  I reminded HANSON PASIERB to be prepared with any vital sign and/or heart rhythm information that could potentially be obtained via home monitoring, at the time of his visit. I reminded SIGMOND NORCIA to expect a phone call prior to his visit.  Ricardo Zuniga 09/26/2019 2:46 PM   INSTRUCTIONS FOR DOWNLOADING THE MYCHART APP TO SMARTPHONE  - The patient must first make sure to have activated MyChart and know their login information - If Apple, go to App Store and type in MyChart in the search bar and download the app. If Android, ask patient to go to [employer] and type in [place] in the search bar and download the app. The app is free but as with any other app downloads, their phone may require them to verify saved payment information or Apple/Android  password.  - The patient will need to then log into the app with their MyChart username and password, and select [facility] as their healthcare provider to link the account. When it is time for your visit, go to the MyChart app, find appointments, and click Begin Video Visit. Be sure to Select Allow for your device to access the Microphone and Camera for your visit. You will then be connected, and your provider will be with you shortly.  **If they have any issues connecting, or need assistance please contact MyChart service desk [phone])**  **If using a computer, in order to ensure the best quality for their visit they will need to use either of the following Internet Browsers: [employer], or Google Chrome**  IF USING DOXIMITY or DOXY.ME - The patient will receive a link just prior to their visit by text.     FULL LENGTH CONSENT FOR TELE-HEALTH VISIT   I hereby voluntarily request, consent and authorize [facility] and its employed or contracted physicians, physician assistants, nurse practitioners or other licensed health care professionals (the Practitioner), to provide me with telemedicine health care services (the "Services") as deemed necessary by the treating Practitioner. I acknowledge and consent to receive the Services by the Practitioner via telemedicine. I understand that the telemedicine visit will involve communicating with the Practitioner through live audiovisual communication technology and the disclosure of certain medical information by electronic transmission. I acknowledge that I have been given the opportunity to request an in-person assessment or other available alternative prior to the telemedicine visit and am voluntarily participating in the telemedicine visit.  I understand that I have the right to withhold or withdraw my consent to the use of telemedicine in the course of my care at any time, without affecting my right to future care or treatment,  and that the Practitioner or I may terminate the telemedicine visit at any time. I understand that I have the right to inspect all information obtained and/or recorded in the course of the telemedicine visit and may receive copies of available information for a reasonable fee.  I understand that some of the potential risks of receiving the Services via telemedicine include:  Marland Kitchen Delay or interruption in medical evaluation due to technological equipment failure or disruption; . Information transmitted may not be sufficient (e.g. poor resolution of images) to allow for appropriate medical decision making by the Practitioner; and/or  . In rare instances, security protocols could fail, causing a breach of personal health information.  Furthermore, I acknowledge that it is my responsibility to provide information about my medical history, conditions and care that is complete and accurate to the best of my ability. I acknowledge that Practitioner's advice, recommendations, and/or decision may be based on factors not within their control, such as incomplete or inaccurate data provided by me or distortions of diagnostic images or specimens that may result from electronic transmissions. I understand that the practice  of medicine is not an Chief Strategy Officer and that Practitioner makes no warranties or guarantees regarding treatment outcomes. I acknowledge that I will receive a copy of this consent concurrently upon execution via email to the email address I last provided but may also request a printed copy by calling the office of [facility].    I understand that my insurance will be billed for this visit.   I have read or had this consent read to me. . I understand the contents of this consent, which adequately explains the benefits and risks of the Services being provided via telemedicine.  . I have been provided ample opportunity to ask questions regarding this consent and the Services and have had my questions  answered to my satisfaction. . I give my informed consent for the services to be provided through the use of telemedicine in my medical care  By participating in this telemedicine visit I agree to the above.

## 2019-09-30 ENCOUNTER — Encounter: Payer: Self-pay | Admitting: Cardiovascular Disease

## 2019-09-30 ENCOUNTER — Telehealth (INDEPENDENT_AMBULATORY_CARE_PROVIDER_SITE_OTHER): Payer: Medicare PPO | Admitting: Cardiovascular Disease

## 2019-09-30 VITALS — BP 116/64 | HR 81 | Ht 70.0 in | Wt 187.6 lb

## 2019-09-30 DIAGNOSIS — I428 Other cardiomyopathies: Secondary | ICD-10-CM

## 2019-09-30 DIAGNOSIS — I5042 Chronic combined systolic (congestive) and diastolic (congestive) heart failure: Secondary | ICD-10-CM

## 2019-09-30 DIAGNOSIS — I10 Essential (primary) hypertension: Secondary | ICD-10-CM

## 2019-09-30 DIAGNOSIS — I25118 Atherosclerotic heart disease of native coronary artery with other forms of angina pectoris: Secondary | ICD-10-CM

## 2019-09-30 DIAGNOSIS — E785 Hyperlipidemia, unspecified: Secondary | ICD-10-CM

## 2019-09-30 NOTE — Progress Notes (Signed)
Virtual Visit via Telephone Note   This visit type was conducted due to national recommendations for restrictions regarding the COVID-19 Pandemic (e.g. social distancing) in an effort to limit this patient's exposure and mitigate transmission in our community.  Due to his co-morbid illnesses, this patient is at least at moderate risk for complications without adequate follow up.  This format is felt to be most appropriate for this patient at this time.  The patient did not have access to video technology/had technical difficulties with video requiring transitioning to audio format only (telephone).  All issues noted in this document were discussed and addressed.  No physical exam could be performed with this format.  Please refer to the patient's chart for his  consent to telehealth for [facility].   Date:  09/30/2019   ID:  Ricardo Zuniga, DOB 1950-12-26, MRN [id number]  Patient Location: Home Provider Location: Home  PCP:  Glenda Chroman, MD  Cardiologist:  Kate Sable, MD  Electrophysiologist:  None   Evaluation Performed:  Follow-Up Visit  Chief Complaint:  CHF  History of Present Illness:    Ricardo Zuniga is a 69 y.o. male with a nonischemic cardiomyopathy, LVEF 40% by echo on 09/02/18.  The patient denies any symptoms of chest pain, palpitations, shortness of breath, lightheadedness, dizziness, leg swelling, orthopnea, PND, and syncope.  He tested positive for COVID in early January. He was never hypoxic nor febrile.  His son is a Engineer, structural in [place] and was looking to build a house in [place]. His family stayed with them over Christmas.  They all wore masks and socially distanced.  His son subsequently tested positive for COVID.  [name] eventually called the CDC for guidance.  He got the first dose of the COVID19 vaccine.  He needs to undergo carpal tunnel surgery on his right hand.    Past Medical History:  Diagnosis Date  . Anal fissure   .  APML (acute promyelocytic leukemia) in remission ([place])    completed treatment 04/2013  . Coronary artery disease    a. 50-60% RCA stenosis by cath in 2015 b. cath in 08/2018 showing 40% RCA stenosis with no obstructive CAD.   Marland Kitchen Difficult intubation   . DM type 2 (diabetes mellitus, type 2) ([place])   . Gastroesophageal reflux disease   . Heart murmur   . Hypercholesteremia   . Myocardial infarction [facility])    " silent"  . Nephrolithiasis   . Nonischemic cardiomyopathy ([place])    a. EF 20% in 2015 - thought to possibly be viral induced or chemotherapy induced from treatments in 2014 b. at 35-40% by echo in 05/2016   . Peptic ulcer disease    remote  . Ureteral colic   . Wears glasses   . Wears hearing aid in both ears    Past Surgical History:  Procedure Laterality Date  . ANAL FISSURE REPAIR    . APPENDECTOMY    . CARPAL TUNNEL RELEASE Left 10/16/2017   Procedure: LEFT CARPAL TUNNEL RELEASE;  Surgeon: Earlie Server, MD;  Location: [facility];  Service: Orthopedics;  Laterality: Left;  . CATARACT EXTRACTION, BILATERAL    . CERVICAL DISC ARTHROPLASTY N/A 01/05/2018   Procedure: Artificial Cervical Disc ReplacementCervical Three-Four, Cervical Four-Five;  Surgeon: Kristeen Miss, MD;  Location: [facility];  Service: Neurosurgery;  Laterality: N/A;  . CERVICAL FUSION    . CHOLECYSTECTOMY    . COLONOSCOPY N/A 06/27/2015   Procedure: COLONOSCOPY;  Surgeon: Rogene Houston,  MD;  [facility];  Service: Endoscopy;  Laterality: N/A;  730  . KNEE SURGERY     x3  . LEFT AND RIGHT HEART CATHETERIZATION WITH CORONARY ANGIOGRAM N/A 12/12/2013   Procedure: LEFT AND RIGHT HEART CATHETERIZATION WITH CORONARY ANGIOGRAM;  Surgeon: [name], MD;  Location: [facility] CATH LAB;  Service: Cardiovascular;  Laterality: N/A;  . LEFT HEART CATH AND CORONARY ANGIOGRAPHY N/A 09/17/2018   Procedure: LEFT HEART CATH AND CORONARY ANGIOGRAPHY;  Surgeon: [place] Hampshire, MD;  Location: [facility] CV LAB;  Service:  Cardiovascular;  Laterality: N/A;  . LUMBAR FUSION    . SHOULDER ACROMIOPLASTY Right 07/18/2015   Procedure: SHOULDER ACROMIOPLASTY;  Surgeon: Earlie Server, MD;  Location: [facility];  Service: Orthopedics;  Laterality: Right;  . SHOULDER ARTHROSCOPY Right 07/18/2015   Procedure: Right shoulder arthroscopy with debridement;  Surgeon: Earlie Server, MD;  Location: [facility];  Service: Orthopedics;  Laterality: Right;  . SHOULDER SURGERY     x3  . TONSILLECTOMY       Current Meds  Medication Sig  . acetaminophen (TYLENOL) 650 MG CR tablet Take 1,300 mg by mouth every 8 (eight) hours as needed for pain.  Marland Kitchen atorvastatin (LIPITOR) 80 MG tablet Take 80 mg by mouth every evening.   . diazepam (VALIUM) 5 MG tablet Take 5 mg by mouth daily as needed (for vertigo).   Marland Kitchen docusate sodium (COLACE) 100 MG capsule Take 200 mg by mouth at bedtime.  . furosemide (LASIX) 20 MG tablet TAKE 1 TABLET BY MOUTH ONCE DAILY AS DIRECTED  . INVOKANA 300 MG TABS Take 300 mg by mouth daily.   . metoprolol succinate (TOPROL-XL) 50 MG 24 hr tablet Take 1 tablet (50 mg total) by mouth daily.  . pantoprazole (PROTONIX) 40 MG tablet Take 40 mg by mouth daily as needed.   . repaglinide (PRANDIN) 2 MG tablet Take 2 mg by mouth 3 (three) times daily before meals.   . sacubitril-valsartan (ENTRESTO) 24-26 MG Take 1 tablet by mouth 2 (two) times daily.     Allergies:   Lisinopril and Lisinopril-hydrochlorothiazide   Social History   Tobacco Use  . Smoking status: Former Smoker    Packs/day: 2.00    Years: 17.00    Pack years: 34.00    Types: Cigarettes    Start date: 02/17/1968    Quit date: 02/28/1986    Years since quitting: 33.6  . Smokeless tobacco: Never Used  Substance Use Topics  . Alcohol use: No    Alcohol/week: 0.0 standard drinks  . Drug use: No     Family Hx: The patient's family history includes Colon cancer in an other family member.  ROS:   Please see the history  of present illness.     All other systems reviewed and are negative.   Prior CV studies:   The following studies were reviewed today:  Echocardiogram: 09/02/2018 Study Conclusions  - Left ventricle: The cavity size was normal. Systolic function was moderately reduced. The estimated ejection fraction was 40%. Diffuse hypokinesis. Doppler parameters are consistent with abnormal left ventricular relaxation (grade 1 diastolic dysfunction). Mild posterior wall thickening. - Aortic valve: There was mild regurgitation.  NST: 09/09/2018  Nonspecific T wave abnormalities in inferolateral leads throughout study.  There was no ST segment deviation noted during stress.  Defect 1: There is a large defect of moderate severity present in the mid anterior, mid anteroseptal, mid inferoseptal, mid inferior, mid inferolateral, apical  anterior and apical inferior location.  Findings consistent with prior myocardial infarction with a mild to moderate degree of peri-infarct ischemia.  This is a high risk study.  Nuclear stress EF: 34%.  Cardiac Catheterization: 09/17/2018  There is moderate to severe left ventricular systolic dysfunction.  LV end diastolic pressure is normal.  Prox RCA lesion is 40% stenosed.  1. Moderate nonobstructive one-vessel coronary artery disease. The RCA stenosis actually appears to be better than most recent cardiac catheterization in 2015. 2. Moderately severely reduced LV systolic function with an EF of 30%. Global hypokinesis. 3. Mild left ventricular end-diastolic pressure.  Recommendations: The patient has nonischemic cardiomyopathy for which I recommend continuing medical therapy. He appears to be euvolemic. Consider switching losartan to Entresto and adding spironolactone.   Labs/Other Tests and Data Reviewed:    EKG:  No ECG reviewed.  Recent Labs: 01/13/2019: BUN 14; Hemoglobin 16.0; Platelets 260; Potassium 3.6; Sodium 137 05/11/2019:  Creatinine, Ser 0.70   Recent Lipid Panel Lab Results  Component Value Date/Time   CHOL 223 (H) 12/11/2013 07:23 AM   TRIG 122 12/11/2013 07:23 AM   HDL 36 (L) 12/11/2013 07:23 AM   CHOLHDL 6.2 12/11/2013 07:23 AM   LDLCALC 163 (H) 12/11/2013 07:23 AM    Wt Readings from Last 3 Encounters:  09/30/19 187 lb 9.6 oz (85.1 kg)  04/19/19 190 lb (86.2 kg)  12/13/18 188 lb 3.2 oz (85.4 kg)     Objective:    Vital Signs:  BP 116/64   Pulse 81   Ht 5\' 10"  (1.778 m)   Wt 187 lb 9.6 oz (85.1 kg)   SpO2 96%   BMI 26.92 kg/m    VITAL SIGNS:  reviewed  ASSESSMENT & PLAN:    1. Chronic combined CHF/nonischemic cardiomyopathy: LVEF 40%. Symptomatically stable. Continue Toprol-XL 50 mg daily and Entresto 24-26 mg bid. Takes Lasix 20 mg daily.  2. CAD: Non-obstructive. Continue medical management with ASA 81 mg (as per PCP recommendations), Toprol-XL, and statin.  3. HTN: BP is normal. No changes to therapy.  4. HLD: Continue atorvastatin.      COVID-19 Education: The signs and symptoms of COVID-19 were discussed with the patient and how to seek care for testing (follow up with PCP or arrange E-visit).  The importance of social distancing was discussed today.  Time:   Today, I have spent 10 minutes with the patient with telehealth technology discussing the above problems.     Medication Adjustments/Labs and Tests Ordered: Current medicines are reviewed at length with the patient today.  Concerns regarding medicines are outlined above.   Tests Ordered: No orders of the defined types were placed in this encounter.   Medication Changes: No orders of the defined types were placed in this encounter.   Follow Up:  In Person in 6 month(s)  Signed, Kate Sable, MD  09/30/2019 10:18 AM    [place]

## 2019-09-30 NOTE — Patient Instructions (Signed)

## 2019-10-14 DIAGNOSIS — Z299 Encounter for prophylactic measures, unspecified: Secondary | ICD-10-CM | POA: Diagnosis not present

## 2019-10-14 DIAGNOSIS — I429 Cardiomyopathy, unspecified: Secondary | ICD-10-CM | POA: Diagnosis not present

## 2019-10-14 DIAGNOSIS — I270 Primary pulmonary hypertension: Secondary | ICD-10-CM | POA: Diagnosis not present

## 2019-10-14 DIAGNOSIS — E1165 Type 2 diabetes mellitus with hyperglycemia: Secondary | ICD-10-CM | POA: Diagnosis not present

## 2019-10-14 DIAGNOSIS — Z6827 Body mass index (BMI) 27.0-27.9, adult: Secondary | ICD-10-CM | POA: Diagnosis not present

## 2019-10-14 DIAGNOSIS — I10 Essential (primary) hypertension: Secondary | ICD-10-CM | POA: Diagnosis not present

## 2019-10-14 DIAGNOSIS — E119 Type 2 diabetes mellitus without complications: Secondary | ICD-10-CM | POA: Diagnosis not present

## 2019-10-14 DIAGNOSIS — I509 Heart failure, unspecified: Secondary | ICD-10-CM | POA: Diagnosis not present

## 2019-10-27 ENCOUNTER — Ambulatory Visit: Payer: Self-pay | Admitting: Physician Assistant

## 2019-10-27 NOTE — H&P (View-Only) (Signed)
Ricardo Zuniga is an 69 y.o. male.   Chief Complaint: right wrist pain HPI: Ricardo Zuniga comes in for a follow-up for a right EMG Nerve Conduction Study which showed severe carpal tunnel. We were getting ready to do this last year but COVID got [place] cancelled. I  repeated it due to some issues with his neck. This does confirm a severe carpal tunnel. He is on aspirin, he has cardiology and other issues. He said he may have had this done in the hospital the last time   Past Medical History:  Diagnosis Date  . Anal fissure   . APML (acute promyelocytic leukemia) in remission ([name])    completed treatment 04/2013  . Coronary artery disease    a. 50-60% RCA stenosis by cath in 2015 b. cath in 08/2018 showing 40% RCA stenosis with no obstructive CAD.   Marland Kitchen Difficult intubation   . DM type 2 (diabetes mellitus, type 2) ([name])   . Gastroesophageal reflux disease   . Heart murmur   . Hypercholesteremia   . Myocardial infarction [name]    " silent"  . Nephrolithiasis   . Nonischemic cardiomyopathy ([name])    a. EF 20% in 2015 - thought to possibly be viral induced or chemotherapy induced from treatments in 2014 b. at 35-40% by echo in 05/2016   . Peptic ulcer disease    remote  . Ureteral colic   . Wears glasses   . Wears hearing aid in both ears     Past Surgical History:  Procedure Laterality Date  . ANAL FISSURE REPAIR    . APPENDECTOMY    . CARPAL TUNNEL RELEASE Left 10/16/2017   Procedure: LEFT CARPAL TUNNEL RELEASE;  Surgeon: Earlie Server, MD;  Location: [facility];  Service: Orthopedics;  Laterality: Left;  . CATARACT EXTRACTION, BILATERAL    . CERVICAL DISC ARTHROPLASTY N/A 01/05/2018   Procedure: Artificial Cervical Disc ReplacementCervical Three-Four, Cervical Four-Five;  Surgeon: Kristeen Miss, MD;  Location: [place] [facility];  Service: Neurosurgery;  Laterality: N/A;  . CERVICAL FUSION    . CHOLECYSTECTOMY    . COLONOSCOPY N/A 06/27/2015   Procedure: COLONOSCOPY;  Surgeon: Rogene Houston, MD;  [facility];  Service: Endoscopy;  Laterality: N/A;  730  . KNEE SURGERY     x3  . LEFT AND RIGHT HEART CATHETERIZATION WITH CORONARY ANGIOGRAM N/A 12/12/2013   Procedure: LEFT AND RIGHT HEART CATHETERIZATION WITH CORONARY ANGIOGRAM;  Surgeon: [name], MD;  Location: [facility] CATH LAB;  Service: Cardiovascular;  Laterality: N/A;  . LEFT HEART CATH AND CORONARY ANGIOGRAPHY N/A 09/17/2018   Procedure: LEFT HEART CATH AND CORONARY ANGIOGRAPHY;  Surgeon: [place] Hampshire, MD;  Location: [facility] CV LAB;  Service: Cardiovascular;  Laterality: N/A;  . LUMBAR FUSION    . SHOULDER ACROMIOPLASTY Right 07/18/2015   Procedure: SHOULDER ACROMIOPLASTY;  Surgeon: Earlie Server, MD;  Location: [facility];  Service: Orthopedics;  Laterality: Right;  . SHOULDER ARTHROSCOPY Right 07/18/2015   Procedure: Right shoulder arthroscopy with debridement;  Surgeon: Earlie Server, MD;  Location: [facility];  Service: Orthopedics;  Laterality: Right;  . SHOULDER SURGERY     x3  . TONSILLECTOMY      Family History  Problem Relation Age of Onset  . Colon cancer Other    Social History:  reports that he quit smoking about 33 years ago. His smoking use included cigarettes. He started smoking about 51 years ago. He has a 34.00 pack-year smoking history.  He has never used smokeless tobacco. He reports that he does not drink alcohol or use drugs.  Allergies:  Allergies  Allergen Reactions  . Lisinopril Cough  . Lisinopril-Hydrochlorothiazide Cough    (Not in a hospital admission)   No results found for this or any previous visit (from the past 48 hour(s)). No results found.  Review of Systems  Musculoskeletal: Positive for arthralgias.  Neurological: Positive for numbness.  All other systems reviewed and are negative.   There were no vitals taken for this visit. Physical Exam  Constitutional: He is oriented to person, place, and time. He appears well-developed and  well-nourished. No distress.  HENT:  Head: Normocephalic and atraumatic.  Eyes: Pupils are equal, round, and reactive to light. Conjunctivae and EOM are normal.  Cardiovascular: Normal rate and intact distal pulses.  Respiratory: Effort normal. No respiratory distress.  GI: Soft. He exhibits no distension.  Musculoskeletal:     Cervical back: Normal range of motion and neck supple.     Comments: Positive tinels, slightly weakened grip on right  Neurological: He is alert and oriented to person, place, and time.  Skin: Skin is warm and dry. No rash noted. No erythema.  Psychiatric: He has a normal mood and affect. His behavior is normal.     Assessment/Plan He is in need for a carpal tunnel release. We will have to get this authorized by his primary care physician and cardiologist.  Chriss Czar, PA-C 10/27/2019, 9:10 PM

## 2019-10-27 NOTE — H&P (Signed)
Ricardo Zuniga is an 69 y.o. male.   Chief Complaint: right wrist pain HPI: Ricardo Zuniga comes in for a follow-up for a right EMG Nerve Conduction Study which showed severe carpal tunnel. We were getting ready to do this last year but COVID got [place] cancelled. I  repeated it due to some issues with his neck. This does confirm a severe carpal tunnel. He is on aspirin, he has cardiology and other issues. He said he may have had this done in the hospital the last time   Past Medical History:  Diagnosis Date  . Anal fissure   . APML (acute promyelocytic leukemia) in remission ([place])    completed treatment 04/2013  . Coronary artery disease    a. 50-60% RCA stenosis by cath in 2015 b. cath in 08/2018 showing 40% RCA stenosis with no obstructive CAD.   Marland Kitchen Difficult intubation   . DM type 2 (diabetes mellitus, type 2) ([place])   . Gastroesophageal reflux disease   . Heart murmur   . Hypercholesteremia   . Myocardial infarction [facility])    " silent"  . Nephrolithiasis   . Nonischemic cardiomyopathy ([place])    a. EF 20% in 2015 - thought to possibly be viral induced or chemotherapy induced from treatments in 2014 b. at 35-40% by echo in 05/2016   . Peptic ulcer disease    remote  . Ureteral colic   . Wears glasses   . Wears hearing aid in both ears     Past Surgical History:  Procedure Laterality Date  . ANAL FISSURE REPAIR    . APPENDECTOMY    . CARPAL TUNNEL RELEASE Left 10/16/2017   Procedure: LEFT CARPAL TUNNEL RELEASE;  Surgeon: Earlie Server, MD;  Location: [place];  Service: Orthopedics;  Laterality: Left;  . CATARACT EXTRACTION, BILATERAL    . CERVICAL DISC ARTHROPLASTY N/A 01/05/2018   Procedure: Artificial Cervical Disc ReplacementCervical Three-Four, Cervical Four-Five;  Surgeon: Kristeen Miss, MD;  Location: [place];  Service: Neurosurgery;  Laterality: N/A;  . CERVICAL FUSION    . CHOLECYSTECTOMY    . COLONOSCOPY N/A 06/27/2015   Procedure: COLONOSCOPY;  Surgeon: Rogene Houston, MD;  [facility];  Service: Endoscopy;  Laterality: N/A;  730  . KNEE SURGERY     x3  . LEFT AND RIGHT HEART CATHETERIZATION WITH CORONARY ANGIOGRAM N/A 12/12/2013   Procedure: LEFT AND RIGHT HEART CATHETERIZATION WITH CORONARY ANGIOGRAM;  Surgeon: [name], MD;  Location: [facility] CATH LAB;  Service: Cardiovascular;  Laterality: N/A;  . LEFT HEART CATH AND CORONARY ANGIOGRAPHY N/A 09/17/2018   Procedure: LEFT HEART CATH AND CORONARY ANGIOGRAPHY;  Surgeon: Wellington Hampshire, MD;  Location: [place] CV LAB;  Service: Cardiovascular;  Laterality: N/A;  . LUMBAR FUSION    . SHOULDER ACROMIOPLASTY Right 07/18/2015   Procedure: SHOULDER ACROMIOPLASTY;  Surgeon: Earlie Server, MD;  Location: [place];  Service: Orthopedics;  Laterality: Right;  . SHOULDER ARTHROSCOPY Right 07/18/2015   Procedure: Right shoulder arthroscopy with debridement;  Surgeon: Earlie Server, MD;  Location: [place];  Service: Orthopedics;  Laterality: Right;  . SHOULDER SURGERY     x3  . TONSILLECTOMY      Family History  Problem Relation Age of Onset  . Colon cancer Other    Social History:  reports that he quit smoking about 33 years ago. His smoking use included cigarettes. He started smoking about 51 years ago. He has a 34.00 pack-year smoking history.  He has never used smokeless tobacco. He reports that he does not drink alcohol or use drugs.  Allergies:  Allergies  Allergen Reactions  . Lisinopril Cough  . Lisinopril-Hydrochlorothiazide Cough    (Not in a hospital admission)   No results found for this or any previous visit (from the past 48 hour(s)). No results found.  Review of Systems  Musculoskeletal: Positive for arthralgias.  Neurological: Positive for numbness.  All other systems reviewed and are negative.   There were no vitals taken for this visit. Physical Exam  Constitutional: He is oriented to person, place, and time. He appears well-developed and  well-nourished. No distress.  HENT:  Head: Normocephalic and atraumatic.  Eyes: Pupils are equal, round, and reactive to light. Conjunctivae and EOM are normal.  Cardiovascular: Normal rate and intact distal pulses.  Respiratory: Effort normal. No respiratory distress.  GI: Soft. He exhibits no distension.  Musculoskeletal:     Cervical back: Normal range of motion and neck supple.     Comments: Positive tinels, slightly weakened grip on right  Neurological: He is alert and oriented to person, place, and time.  Skin: Skin is warm and dry. No rash noted. No erythema.  Psychiatric: He has a normal mood and affect. His behavior is normal.     Assessment/Plan He is in need for a carpal tunnel release. We will have to get this authorized by his primary care physician and cardiologist.  Chriss Czar, PA-C 10/27/2019, 9:10 PM

## 2019-11-07 DIAGNOSIS — C9241 Acute promyelocytic leukemia, in remission: Secondary | ICD-10-CM | POA: Diagnosis not present

## 2019-11-08 DIAGNOSIS — E1165 Type 2 diabetes mellitus with hyperglycemia: Secondary | ICD-10-CM | POA: Diagnosis not present

## 2019-11-11 ENCOUNTER — Other Ambulatory Visit: Payer: Self-pay | Admitting: Student

## 2019-11-14 NOTE — Telephone Encounter (Signed)
This is a  pt.  °

## 2019-11-16 NOTE — Patient Instructions (Addendum)
DUE TO COVID-19 ONLY ONE VISITOR IS ALLOWED TO COME WITH YOU AND STAY IN THE WAITING ROOM ONLY DURING PRE OP AND PROCEDURE DAY OF SURGERY. TWO   VISITOR MAY VISIT WITH YOU AFTER SURGERY IN YOUR PRIVATE ROOM DURING VISITING HOURS ONLY!   YOU NEED TO HAVE A COVID 19 TEST ON  _4-13-21______ @__235  pm_____, THIS TEST MUST BE DONE BEFORE SURGERY, [facility]  ONCE YOUR COVID TEST IS COMPLETED, PLEASE BEGIN THE QUARANTINE INSTRUCTIONS AS OUTLINED IN YOUR HANDOUT.                Ricardo Zuniga  11/16/2019   Your procedure is scheduled on: 11-25-19   Report to [facility]  Entrance   Report to   Short stay  at        0530 AM     Call this number if you have problems the morning of surgery [phone]    Remember: NO SOLID FOOD AFTER MIDNIGHT THE NIGHT PRIOR TO SURGERY. NOTHING BY MOUTH EXCEPT CLEAR LIQUIDS UNTIL     0430 am. PLEASE FINISH   G2    DRINK PER SURGEON ORDER  WHICH NEEDS TO BE COMPLETED AT       0430 am then nothing by mouth.    CLEAR LIQUID DIET   Foods Allowed                                                                                 Foods Excluded  Coffee and tea, regular and decaf   No creamer                          liquids that you cannot  Plain Jell-O any favor except red or purple                                           see through such as: Fruit ices (not with fruit pulp)                                                        milk, soups, orange juice  Iced Popsicles                                                         All solid food Carbonated beverages, regular and diet                                    Cranberry, grape and apple juices Sports drinks like Gatorade Lightly seasoned clear broth or consume(fat free) Sugar, honey syrup  _____________________________________________________________________   Ricardo Zuniga  YOUR TEETH MORNING OF SURGERY AND RINSE YOUR MOUTH OUT, NO CHEWING GUM CANDY OR MINTS.     Take these medicines  the morning of surgery with A SIP OF WATER: omeprazole, protonix if needed, metoprolol  Hold Invokana day before surgery  Take repaglinide(Prandin) as usual day before surgery  DO NOT TAKE ANY DIABETIC MEDICATIONS DAY OF YOUR SURGERY                               You may not have any metal on your body including hair pins and              piercings  Do not wear jewelry,lotions, powders or perfumes, deodorant                   Men may shave face and neck.   Do not bring valuables to the hospital. [place].  Contacts, dentures or bridgework may not be worn into surgery.      Patients discharged the day of surgery will not be allowed to drive home. IF YOU ARE HAVING SURGERY AND GOING HOME THE SAME DAY, YOU MUST HAVE AN ADULT TO DRIVE YOU HOME AND BE WITH YOU FOR 24 HOURS. YOU MAY GO HOME BY TAXI OR UBER OR ORTHERWISE, BUT AN ADULT MUST ACCOMPANY YOU HOME AND STAY WITH YOU FOR 24 HOURS.  Name and phone number of your driver:  Special Instructions: N/A              Please read over the following fact sheets you were given: _____________________________________________________________________             [facility] - Preparing for Surgery Before surgery, you can play an important role.  Because skin is not sterile, your skin needs to be as free of germs as possible.  You can reduce the number of germs on your skin by washing with CHG (chlorahexidine gluconate) soap before surgery.  CHG is an antiseptic cleaner which kills germs and bonds with the skin to continue killing germs even after washing. Please DO NOT use if you have an allergy to CHG or antibacterial soaps.  If your skin becomes reddened/irritated stop using the CHG and inform your nurse when you arrive at Short Stay. Do not shave (including legs and underarms) for at least 48 hours prior to the first CHG shower.  You may shave your face/neck. Please follow these instructions  carefully:  1.  Shower with CHG Soap the night before surgery and the  morning of Surgery.  2.  If you choose to wash your hair, wash your hair first as usual with your  normal  shampoo.  3.  After you shampoo, rinse your hair and body thoroughly to remove the  shampoo.                           4.  Use CHG as you would any other liquid soap.  You can apply chg directly  to the skin and wash                       Gently with a scrungie or clean washcloth.  5.  Apply the CHG Soap to your body ONLY FROM THE NECK DOWN.   Do not  use on face/ open                           Wound or open sores. Avoid contact with eyes, ears mouth and genitals (private parts).                       Wash face,  Genitals (private parts) with your normal soap.             6.  Wash thoroughly, paying special attention to the area where your surgery  will be performed.  7.  Thoroughly rinse your body with warm water from the neck down.  8.  DO NOT shower/wash with your normal soap after using and rinsing off  the CHG Soap.                9.  Pat yourself dry with a clean towel.            10.  Wear clean pajamas.            11.  Place clean sheets on your bed the night of your first shower and do not  sleep with pets. Day of Surgery : Do not apply any lotions/deodorants the morning of surgery.  Please wear clean clothes to the hospital/surgery center.  FAILURE TO FOLLOW THESE INSTRUCTIONS MAY RESULT IN THE CANCELLATION OF YOUR SURGERY PATIENT SIGNATURE_________________________________  NURSE SIGNATURE__________________________________  ________________________________________________________________________   Ricardo Zuniga  An incentive spirometer is a tool that can help keep your lungs clear and active. This tool measures how well you are filling your lungs with each breath. Taking long deep breaths may help reverse or decrease the chance of developing breathing (pulmonary) problems (especially infection)  following:  A long period of time when you are unable to move or be active. BEFORE THE PROCEDURE   If the spirometer includes an indicator to show your best effort, your nurse or respiratory therapist will set it to a desired goal.  If possible, sit up straight or lean slightly forward. Try not to slouch.  Hold the incentive spirometer in an upright position. INSTRUCTIONS FOR USE  1. Sit on the edge of your bed if possible, or sit up as far as you can in bed or on a chair. 2. Hold the incentive spirometer in an upright position. 3. Breathe out normally. 4. Place the mouthpiece in your mouth and seal your lips tightly around it. 5. Breathe in slowly and as deeply as possible, raising the piston or the ball toward the top of the column. 6. Hold your breath for 3-5 seconds or for as long as possible. Allow the piston or ball to fall to the bottom of the column. 7. Remove the mouthpiece from your mouth and breathe out normally. 8. Rest for a few seconds and repeat Steps 1 through 7 at least 10 times every 1-2 hours when you are awake. Take your time and take a few normal breaths between deep breaths. 9. The spirometer may include an indicator to show your best effort. Use the indicator as a goal to work toward during each repetition. 10. After each set of 10 deep breaths, practice coughing to be sure your lungs are clear. If you have an incision (the cut made at the time of surgery), support your incision when coughing by placing a pillow or rolled up towels firmly against it. Once you are able to get out of bed, walk  around indoors and cough well. You may stop using the incentive spirometer when instructed by your caregiver.  RISKS AND COMPLICATIONS  Take your time so you do not get dizzy or light-headed.  If you are in pain, you may need to take or ask for pain medication before doing incentive spirometry. It is harder to take a deep breath if you are having pain. AFTER USE  Rest and  breathe slowly and easily.  It can be helpful to keep track of a log of your progress. Your caregiver can provide you with a simple table to help with this. If you are using the spirometer at home, follow these instructions: [place] IF:   You are having difficultly using the spirometer.  You have trouble using the spirometer as often as instructed.  Your pain medication is not giving enough relief while using the spirometer.  You develop fever of 100.5 F (38.1 C) or higher. SEEK IMMEDIATE MEDICAL CARE IF:   You cough up bloody sputum that had not been present before.  You develop fever of 102 F (38.9 C) or greater.  You develop worsening pain at or near the incision site. MAKE SURE YOU:   Understand these instructions.  Will watch your condition.  Will get help right away if you are not doing well or get worse. Document Released: 12/08/2006 Document Revised: 10/20/2011 Document Reviewed: 02/08/2007 [facility] Patient Information 2014 [place], [place].   ________________________________________________________________________

## 2019-11-16 NOTE — Progress Notes (Signed)
PCP - Jerene Bears Cardiologist - Dr Natividad Brood 09-29-18 epic  Chest x-ray - 01-14-19 epic EKG - 11-17-19 epic Stress Test -  ECHO - 09-02-18 epic Cardiac Cath - 09-17-18 epic  Sleep Study -  CPAP -   Fasting Blood Sugar - 120 Checks Blood Sugar __1___ times a day  Blood Thinner Instructions: Aspirin Instructions: Last Dose:  Anesthesia review: Non obs cad,CHF, difficult intubation  Patient denies shortness of breath, fever, cough and chest pain at PAT appointment  none   Patient verbalized understanding of instructions that were given to them at the PAT appointment. Patient was also instructed that they will need to review over the PAT instructions again at home before surgery.

## 2019-11-17 ENCOUNTER — Encounter (HOSPITAL_COMMUNITY)
Admission: RE | Admit: 2019-11-17 | Discharge: 2019-11-17 | Disposition: A | Discharge status: Home / Self Care | Payer: Medicare PPO | Source: Ambulatory Visit | Attending: Orthopedic Surgery | Admitting: Orthopedic Surgery

## 2019-11-17 ENCOUNTER — Encounter (HOSPITAL_COMMUNITY): Payer: Self-pay

## 2019-11-17 ENCOUNTER — Other Ambulatory Visit: Payer: Self-pay

## 2019-11-17 DIAGNOSIS — E119 Type 2 diabetes mellitus without complications: Secondary | ICD-10-CM | POA: Insufficient documentation

## 2019-11-17 DIAGNOSIS — Z79899 Other long term (current) drug therapy: Secondary | ICD-10-CM | POA: Diagnosis not present

## 2019-11-17 DIAGNOSIS — I252 Old myocardial infarction: Secondary | ICD-10-CM | POA: Diagnosis not present

## 2019-11-17 DIAGNOSIS — G5601 Carpal tunnel syndrome, right upper limb: Secondary | ICD-10-CM | POA: Insufficient documentation

## 2019-11-17 DIAGNOSIS — I2510 Atherosclerotic heart disease of native coronary artery without angina pectoris: Secondary | ICD-10-CM | POA: Diagnosis not present

## 2019-11-17 DIAGNOSIS — Z01818 Encounter for other preprocedural examination: Secondary | ICD-10-CM | POA: Diagnosis not present

## 2019-11-17 DIAGNOSIS — Z87891 Personal history of nicotine dependence: Secondary | ICD-10-CM | POA: Diagnosis not present

## 2019-11-17 DIAGNOSIS — Z7982 Long term (current) use of aspirin: Secondary | ICD-10-CM | POA: Insufficient documentation

## 2019-11-17 HISTORY — DX: Heart failure, unspecified: I50.9

## 2019-11-17 HISTORY — DX: Pneumonia, unspecified organism: J18.9

## 2019-11-17 HISTORY — DX: Personal history of urinary calculi: Z87.442

## 2019-11-17 HISTORY — DX: Myoneural disorder, unspecified: G70.9

## 2019-11-17 LAB — CBC
HCT: 48 % (ref 39.0–52.0)
Hemoglobin: 17.4 g/dL — ABNORMAL HIGH (ref 13.0–17.0)
MCH: 33.1 pg (ref 26.0–34.0)
MCHC: 36.3 g/dL — ABNORMAL HIGH (ref 30.0–36.0)
MCV: 91.3 fL (ref 80.0–100.0)
Platelets: 265 10*3/uL (ref 150–400)
RBC: 5.26 MIL/uL (ref 4.22–5.81)
RDW: 12.9 % (ref 11.5–15.5)
WBC: 6.3 10*3/uL (ref 4.0–10.5)
nRBC: 0 % (ref 0.0–0.2)

## 2019-11-17 LAB — BASIC METABOLIC PANEL
Anion gap: 8 (ref 5–15)
BUN: 18 mg/dL (ref 8–23)
CO2: 24 mmol/L (ref 22–32)
Calcium: 9.3 mg/dL (ref 8.9–10.3)
Chloride: 106 mmol/L (ref 98–111)
Creatinine, Ser: 0.76 mg/dL (ref 0.61–1.24)
GFR calc Af Amer: >60 mL/min (ref >60)
GFR calc non Af Amer: >60 mL/min (ref >60)
Glucose, Bld: 142 mg/dL — ABNORMAL HIGH (ref 70–99)
Potassium: 3.9 mmol/L (ref 3.5–5.1)
Sodium: 138 mmol/L (ref 135–145)

## 2019-11-18 LAB — HEMOGLOBIN A1C
Hgb A1c MFr Bld: 6.1 % — ABNORMAL HIGH (ref 4.8–5.6)
Mean Plasma Glucose: 128 mg/dL

## 2019-11-18 NOTE — Progress Notes (Signed)
Anesthesia Chart Review   Case: [id number] Date/Time: 11/25/19 0715   Procedure: CARPAL TUNNEL RELEASE (Right )   Anesthesia type: Choice   Pre-op diagnosis: right carpal tunnel syndrome   [facility] ROOM 05 / [facility]   Surgeons: Earlie Server, MD      DISCUSSION:69 y.o. former smoker (34 pack years, quit 02/28/86) with h/o GERD, APML completed treatment 2014, nonobstructive CAD, nonischemic cardiomyopathy, DM II, right carpal tunnel syndrome scheduled for above procedure 11/25/2019 with Dr. Earlie Server.   H/o difficult intubation due to anterior larynx.  S/p cervical disc replacement 01/05/2018 with general anesthesia.  Per anesthesia note, "CRNA DL X 1 with MAC 4 blade with grade IV view. Dr. Sabra Heck DL X 2 and attempt to pass bougie unsuccessful. Easy mask with oral airway. CRNA DL X 3 with Glidescope MAC 4 blade with grade I view and successful intubation."    S/p left carpal tunnel release with MAC 01/29/3085, no complications noted.   Pt last seen by cardiologist, Dr. Kate Sable, 09/30/19.  Per OV note CHF symptomatically stable, LVEF 40%.   Anticipate pt can proceed with planned procedure barring acute status change.   VS: BP 121/72   Pulse 86   Temp 36.8 C (Oral)   Resp 16   Ht 5' 9.5" (1.765 m)   Wt 85.7 kg   SpO2 98%   BMI 27.51 kg/m   PROVIDERS: Glenda Chroman, MD is PCP  Kate Sable, MD is Cardiologist  LABS: Labs reviewed: Acceptable for surgery. (all labs ordered are listed, but only abnormal results are displayed)  Labs Reviewed  BASIC METABOLIC PANEL - Abnormal; Notable for the following components:      Result Value   Glucose, Bld 142 (*)    All other components within normal limits  CBC - Abnormal; Notable for the following components:   Hemoglobin 17.4 (*)    MCHC 36.3 (*)    All other components within normal limits  HEMOGLOBIN A1C - Abnormal; Notable for the following components:   Hgb A1c MFr Bld 6.1 (*)    All other components within  normal limits     IMAGES:   EKG: 11/17/2019 Rate 86 bpm Normal sinus rhythm Nonspecific T wave abnormality Abnormal ECG No old tracing to compare  CV: Cardiac Cath 09/17/2018  There is moderate to severe left ventricular systolic dysfunction.  LV end diastolic pressure is normal.  Prox RCA lesion is 40% stenosed.   1.  Moderate nonobstructive one-vessel coronary artery disease.  The RCA stenosis actually appears to be better than most recent cardiac catheterization in 2015. 2.  Moderately severely reduced LV systolic function with an EF of 30%.  Global hypokinesis. 3.  Mild left ventricular end-diastolic pressure.  Recommendations: The patient has nonischemic cardiomyopathy for which I recommend continuing medical therapy.  He appears to be euvolemic.  Consider switching losartan to Entresto and adding spironolactone.  Echo 09/02/2018 Study Conclusions   - Left ventricle: The cavity size was normal. Systolic function was  moderately reduced. The estimated ejection fraction was 40%.  Diffuse hypokinesis. Doppler parameters are consistent with  abnormal left ventricular relaxation (grade 1 diastolic  dysfunction). Mild posterior wall thickening.  - Aortic valve: There was mild regurgitation. Past Medical History:  Diagnosis Date  . Anal fissure   . APML (acute promyelocytic leukemia) in remission ([place])    completed treatment 04/2013  . CHF (congestive heart failure) ([place])   . Coronary artery disease    a. 50-60% RCA  stenosis by cath in 2015 b. cath in 08/2018 showing 40% RCA stenosis with no obstructive CAD.   Marland Kitchen Difficult intubation   . DM type 2 (diabetes mellitus, type 2) ([facility])    Type 2  . Gastroesophageal reflux disease   . Heart murmur   . History of kidney stones   . Hypercholesteremia   . Myocardial infarction [facility])    " silent"  . Neuromuscular disorder ([facility])    neuropathy feet  . Nonischemic cardiomyopathy ([place])    a. EF 20% in 2015 - thought to  possibly be viral induced or chemotherapy induced from treatments in 2014 b. at 35-40% by echo in 05/2016   . Peptic ulcer disease    remote  . Pneumonia   . Ureteral colic   . Wears glasses   . Wears hearing aid in both ears     Past Surgical History:  Procedure Laterality Date  . ANAL FISSURE REPAIR    . APPENDECTOMY    . CARPAL TUNNEL RELEASE Left 10/16/2017   Procedure: LEFT CARPAL TUNNEL RELEASE;  Surgeon: Earlie Server, MD;  Location: [place];  Service: Orthopedics;  Laterality: Left;  . CATARACT EXTRACTION, BILATERAL    . CERVICAL DISC ARTHROPLASTY N/A 01/05/2018   Procedure: Artificial Cervical Disc ReplacementCervical Three-Four, Cervical Four-Five;  Surgeon: Kristeen Miss, MD;  Location: [place];  Service: Neurosurgery;  Laterality: N/A;  . CERVICAL FUSION    . CHOLECYSTECTOMY    . COLONOSCOPY N/A 06/27/2015   Procedure: COLONOSCOPY;  Surgeon: Rogene Houston, MD;  [facility];  Service: Endoscopy;  Laterality: N/A;  730  . KNEE SURGERY     x3  . LEFT AND RIGHT HEART CATHETERIZATION WITH CORONARY ANGIOGRAM N/A 12/12/2013   Procedure: LEFT AND RIGHT HEART CATHETERIZATION WITH CORONARY ANGIOGRAM;  Surgeon: [name], MD;  Location: [facility] CATH LAB;  Service: Cardiovascular;  Laterality: N/A;  . LEFT HEART CATH AND CORONARY ANGIOGRAPHY N/A 09/17/2018   Procedure: LEFT HEART CATH AND CORONARY ANGIOGRAPHY;  Surgeon: Wellington Hampshire, MD;  Location: [place] CV LAB;  Service: Cardiovascular;  Laterality: N/A;  . LUMBAR FUSION    . SHOULDER ACROMIOPLASTY Right 07/18/2015   Procedure: SHOULDER ACROMIOPLASTY;  Surgeon: Earlie Server, MD;  Location: [place];  Service: Orthopedics;  Laterality: Right;  . SHOULDER ARTHROSCOPY Right 07/18/2015   Procedure: Right shoulder arthroscopy with debridement;  Surgeon: Earlie Server, MD;  Location: [place];  Service: Orthopedics;  Laterality: Right;  . SHOULDER SURGERY     x3  . TONSILLECTOMY       MEDICATIONS: . acetaminophen (TYLENOL) 650 MG CR tablet  . aspirin EC 81 MG tablet  . atorvastatin (LIPITOR) 80 MG tablet  . cetirizine (ZYRTEC) 10 MG tablet  . diazepam (VALIUM) 5 MG tablet  . docusate sodium (COLACE) 100 MG capsule  . furosemide (LASIX) 20 MG tablet  . INVOKANA 300 MG TABS  . metoprolol succinate (TOPROL-XL) 50 MG 24 hr tablet  . omeprazole (PRILOSEC) 20 MG capsule  . pantoprazole (PROTONIX) 40 MG tablet  . repaglinide (PRANDIN) 2 MG tablet  . sacubitril-valsartan (ENTRESTO) 24-26 MG   No current facility-administered medications for this encounter.   Maia Plan [facility] Pre-Surgical Testing [phone] 11/21/19  1:17 PM

## 2019-11-22 ENCOUNTER — Other Ambulatory Visit (HOSPITAL_COMMUNITY)
Admission: RE | Admit: 2019-11-22 | Discharge: 2019-11-22 | Disposition: A | Discharge status: Home / Self Care | Payer: Medicare PPO | Source: Ambulatory Visit | Attending: Orthopedic Surgery | Admitting: Orthopedic Surgery

## 2019-11-22 ENCOUNTER — Other Ambulatory Visit: Payer: Self-pay

## 2019-11-22 DIAGNOSIS — Z01812 Encounter for preprocedural laboratory examination: Secondary | ICD-10-CM | POA: Diagnosis not present

## 2019-11-22 DIAGNOSIS — Z20822 Contact with and (suspected) exposure to covid-19: Secondary | ICD-10-CM | POA: Insufficient documentation

## 2019-11-23 LAB — SARS CORONAVIRUS 2 (TAT 6-24 HRS): SARS Coronavirus 2: NEGATIVE

## 2019-11-24 NOTE — Anesthesia Preprocedure Evaluation (Addendum)
Anesthesia Evaluation  Patient identified by MRN, date of birth, ID band Patient awake    Reviewed: Allergy & Precautions, NPO status , Patient's Chart, lab work & pertinent test results, reviewed documented beta blocker date and time   History of Anesthesia Complications (+) DIFFICULT AIRWAY and history of anesthetic complications (Difficult DL, successful Glidescope 2019)  Airway Mallampati: III  TM Distance: >3 FB Neck ROM: Full    Dental no notable dental hx.    Pulmonary former smoker,    Pulmonary exam normal        Cardiovascular hypertension, Pt. on medications and Pt. on home beta blockers + CAD, + Past MI and +CHF  Normal cardiovascular exam  Cardiac cath 09/17/2018: prox RCA lesion 40%, EF 30%, global hypokinesis     Neuro/Psych negative neurological ROS  negative psych ROS   GI/Hepatic Neg liver ROS, PUD, GERD  Controlled and Medicated,  Endo/Other  diabetes, Type 2  Renal/GU negative Renal ROS  negative genitourinary   Musculoskeletal negative musculoskeletal ROS (+)   Abdominal   Peds  Hematology negative hematology ROS (+)   Anesthesia Other Findings Hx of APML s/p chemo 2014  Reproductive/Obstetrics negative OB ROS                            Anesthesia Physical Anesthesia Plan  ASA: III  Anesthesia Plan: MAC   Post-op Pain Management:    Induction:   PONV Risk Score and Plan: 1 and Treatment may vary due to age or medical condition, Propofol infusion and Ondansetron  Airway Management Planned: Natural Airway and Simple Face Mask  Additional Equipment: None  Intra-op Plan:   Post-operative Plan:   Informed Consent: I have reviewed the patients History and Physical, chart, labs and discussed the procedure including the risks, benefits and alternatives for the proposed anesthesia with the patient or authorized representative who has indicated his/her  understanding and acceptance.       Plan Discussed with: CRNA  Anesthesia Plan Comments:        Anesthesia Quick Evaluation

## 2019-11-25 ENCOUNTER — Ambulatory Visit (HOSPITAL_COMMUNITY): Payer: Medicare PPO | Admitting: Physician Assistant

## 2019-11-25 ENCOUNTER — Encounter (HOSPITAL_COMMUNITY)
Admission: RE | Disposition: A | Discharge status: Home / Self Care | Payer: Self-pay | Source: Home / Self Care | Attending: Orthopedic Surgery

## 2019-11-25 ENCOUNTER — Ambulatory Visit (HOSPITAL_COMMUNITY)
Admission: RE | Admit: 2019-11-25 | Discharge: 2019-11-25 | Disposition: A | Discharge status: Home / Self Care | Payer: Medicare PPO | Attending: Orthopedic Surgery | Admitting: Orthopedic Surgery

## 2019-11-25 ENCOUNTER — Ambulatory Visit (HOSPITAL_COMMUNITY): Payer: Medicare PPO | Admitting: Certified Registered Nurse Anesthetist

## 2019-11-25 ENCOUNTER — Encounter (HOSPITAL_COMMUNITY): Payer: Self-pay | Admitting: Orthopedic Surgery

## 2019-11-25 DIAGNOSIS — I2510 Atherosclerotic heart disease of native coronary artery without angina pectoris: Secondary | ICD-10-CM | POA: Diagnosis not present

## 2019-11-25 DIAGNOSIS — E119 Type 2 diabetes mellitus without complications: Secondary | ICD-10-CM | POA: Diagnosis not present

## 2019-11-25 DIAGNOSIS — Z9049 Acquired absence of other specified parts of digestive tract: Secondary | ICD-10-CM | POA: Diagnosis not present

## 2019-11-25 DIAGNOSIS — Z800 Family history of malignant neoplasm of digestive organs: Secondary | ICD-10-CM | POA: Insufficient documentation

## 2019-11-25 DIAGNOSIS — Z7982 Long term (current) use of aspirin: Secondary | ICD-10-CM | POA: Diagnosis not present

## 2019-11-25 DIAGNOSIS — E7800 Pure hypercholesterolemia, unspecified: Secondary | ICD-10-CM | POA: Diagnosis not present

## 2019-11-25 DIAGNOSIS — I252 Old myocardial infarction: Secondary | ICD-10-CM | POA: Diagnosis not present

## 2019-11-25 DIAGNOSIS — Z9221 Personal history of antineoplastic chemotherapy: Secondary | ICD-10-CM | POA: Insufficient documentation

## 2019-11-25 DIAGNOSIS — Z9842 Cataract extraction status, left eye: Secondary | ICD-10-CM | POA: Insufficient documentation

## 2019-11-25 DIAGNOSIS — Z888 Allergy status to other drugs, medicaments and biological substances status: Secondary | ICD-10-CM | POA: Diagnosis not present

## 2019-11-25 DIAGNOSIS — K219 Gastro-esophageal reflux disease without esophagitis: Secondary | ICD-10-CM | POA: Diagnosis not present

## 2019-11-25 DIAGNOSIS — I509 Heart failure, unspecified: Secondary | ICD-10-CM | POA: Diagnosis not present

## 2019-11-25 DIAGNOSIS — Z981 Arthrodesis status: Secondary | ICD-10-CM | POA: Diagnosis not present

## 2019-11-25 DIAGNOSIS — Z87891 Personal history of nicotine dependence: Secondary | ICD-10-CM | POA: Diagnosis not present

## 2019-11-25 DIAGNOSIS — G5601 Carpal tunnel syndrome, right upper limb: Secondary | ICD-10-CM | POA: Diagnosis not present

## 2019-11-25 DIAGNOSIS — E785 Hyperlipidemia, unspecified: Secondary | ICD-10-CM | POA: Diagnosis not present

## 2019-11-25 DIAGNOSIS — I420 Dilated cardiomyopathy: Secondary | ICD-10-CM | POA: Diagnosis not present

## 2019-11-25 DIAGNOSIS — Z9841 Cataract extraction status, right eye: Secondary | ICD-10-CM | POA: Diagnosis not present

## 2019-11-25 DIAGNOSIS — I110 Hypertensive heart disease with heart failure: Secondary | ICD-10-CM | POA: Insufficient documentation

## 2019-11-25 DIAGNOSIS — R011 Cardiac murmur, unspecified: Secondary | ICD-10-CM | POA: Diagnosis not present

## 2019-11-25 DIAGNOSIS — Z87442 Personal history of urinary calculi: Secondary | ICD-10-CM | POA: Diagnosis not present

## 2019-11-25 DIAGNOSIS — I428 Other cardiomyopathies: Secondary | ICD-10-CM | POA: Diagnosis not present

## 2019-11-25 DIAGNOSIS — C9241 Acute promyelocytic leukemia, in remission: Secondary | ICD-10-CM | POA: Diagnosis not present

## 2019-11-25 DIAGNOSIS — K279 Peptic ulcer, site unspecified, unspecified as acute or chronic, without hemorrhage or perforation: Secondary | ICD-10-CM | POA: Insufficient documentation

## 2019-11-25 HISTORY — PX: CARPAL TUNNEL RELEASE: SHX101

## 2019-11-25 LAB — GLUCOSE, CAPILLARY
Glucose-Capillary: 168 mg/dL — ABNORMAL HIGH (ref 70–99)
Glucose-Capillary: 145 mg/dL — ABNORMAL HIGH (ref 70–99)

## 2019-11-25 SURGERY — CARPAL TUNNEL RELEASE
Anesthesia: Monitor Anesthesia Care | Laterality: Right

## 2019-11-25 MED ORDER — BUPIVACAINE HCL 0.25 % IJ SOLN
INTRAMUSCULAR | Status: AC
  Filled 2019-11-25: qty 1

## 2019-11-25 MED ORDER — LACTATED RINGERS IV SOLN: INTRAVENOUS | Status: DC

## 2019-11-25 MED ORDER — ACETAMINOPHEN 500 MG PO TABS
1000.0000 mg | ORAL_TABLET | Freq: Once | ORAL | Status: AC
  Administered 2019-11-25: 1000 mg via ORAL
  Filled 2019-11-25: qty 2

## 2019-11-25 MED ORDER — OXYCODONE HCL 5 MG PO TABS: 5.0000 mg | ORAL_TABLET | Freq: Once | ORAL | Status: DC | PRN

## 2019-11-25 MED ORDER — ONDANSETRON HCL 4 MG/2ML IJ SOLN
INTRAMUSCULAR | Status: DC | PRN
  Administered 2019-11-25: 4 mg via INTRAVENOUS

## 2019-11-25 MED ORDER — MIDAZOLAM HCL 2 MG/2ML IJ SOLN
INTRAMUSCULAR | Status: AC
  Filled 2019-11-25: qty 2

## 2019-11-25 MED ORDER — DEXAMETHASONE SODIUM PHOSPHATE 10 MG/ML IJ SOLN
INTRAMUSCULAR | Status: AC
  Filled 2019-11-25: qty 1

## 2019-11-25 MED ORDER — FENTANYL CITRATE (PF) 100 MCG/2ML IJ SOLN
INTRAMUSCULAR | Status: DC | PRN
  Administered 2019-11-25: 50 ug via INTRAVENOUS

## 2019-11-25 MED ORDER — 0.9 % SODIUM CHLORIDE (POUR BTL) OPTIME
TOPICAL | Status: DC | PRN
  Administered 2019-11-25: 1000 mL

## 2019-11-25 MED ORDER — LIDOCAINE HCL (PF) 1 % IJ SOLN
INTRAMUSCULAR | Status: AC
  Filled 2019-11-25: qty 30

## 2019-11-25 MED ORDER — MIDAZOLAM HCL 5 MG/5ML IJ SOLN
INTRAMUSCULAR | Status: DC | PRN
  Administered 2019-11-25: 2 mg via INTRAVENOUS

## 2019-11-25 MED ORDER — DIAZEPAM 2 MG PO TABS: 2.0000 mg | ORAL_TABLET | Freq: Three times a day (TID) | ORAL | 0 refills | Status: AC | PRN

## 2019-11-25 MED ORDER — PROMETHAZINE HCL 25 MG/ML IJ SOLN: 6.2500 mg | INTRAMUSCULAR | Status: DC | PRN

## 2019-11-25 MED ORDER — TRAMADOL HCL 50 MG PO TABS: 50.0000 mg | ORAL_TABLET | Freq: Four times a day (QID) | ORAL | 0 refills | Status: DC | PRN

## 2019-11-25 MED ORDER — FENTANYL CITRATE (PF) 100 MCG/2ML IJ SOLN: 25.0000 ug | INTRAMUSCULAR | Status: DC | PRN

## 2019-11-25 MED ORDER — OXYCODONE HCL 5 MG/5ML PO SOLN: 5.0000 mg | Freq: Once | ORAL | Status: DC | PRN

## 2019-11-25 MED ORDER — PROPOFOL 500 MG/50ML IV EMUL
INTRAVENOUS | Status: DC | PRN
  Administered 2019-11-25: 20 mg via INTRAVENOUS

## 2019-11-25 MED ORDER — ONDANSETRON HCL 4 MG/2ML IJ SOLN
INTRAMUSCULAR | Status: AC
  Filled 2019-11-25: qty 2

## 2019-11-25 MED ORDER — BUPIVACAINE HCL (PF) 0.25 % IJ SOLN
INTRAMUSCULAR | Status: DC | PRN
  Administered 2019-11-25: 5 mL

## 2019-11-25 MED ORDER — PROPOFOL 500 MG/50ML IV EMUL
INTRAVENOUS | Status: DC | PRN
  Administered 2019-11-25: 40 ug/kg/min via INTRAVENOUS

## 2019-11-25 MED ORDER — CHLORHEXIDINE GLUCONATE 4 % EX LIQD: 60.0000 mL | Freq: Once | CUTANEOUS | Status: DC

## 2019-11-25 MED ORDER — CEFAZOLIN SODIUM-DEXTROSE 2-4 GM/100ML-% IV SOLN
2.0000 g | INTRAVENOUS | Status: AC
  Administered 2019-11-25: 2 g via INTRAVENOUS
  Filled 2019-11-25: qty 100

## 2019-11-25 MED ORDER — SODIUM CHLORIDE 0.9 % IV SOLN: INTRAVENOUS | Status: DC

## 2019-11-25 MED ORDER — FENTANYL CITRATE (PF) 100 MCG/2ML IJ SOLN
INTRAMUSCULAR | Status: AC
  Filled 2019-11-25: qty 2

## 2019-11-25 MED ORDER — LIDOCAINE HCL (PF) 1 % IJ SOLN
INTRAMUSCULAR | Status: DC | PRN
  Administered 2019-11-25: 5 mL

## 2019-11-25 MED ORDER — PROPOFOL 500 MG/50ML IV EMUL
INTRAVENOUS | Status: AC
  Filled 2019-11-25: qty 50

## 2019-11-25 SURGICAL SUPPLY — 36 items
BNDG COHESIVE 4X5 TAN STRL (GAUZE/BANDAGES/DRESSINGS) ×2 IMPLANT
BNDG CONFORM 3 STRL LF (GAUZE/BANDAGES/DRESSINGS) ×2 IMPLANT
BNDG ELASTIC 2X5.8 VLCR STR LF (GAUZE/BANDAGES/DRESSINGS) ×2 IMPLANT
BNDG ELASTIC 3X5.8 VLCR STR LF (GAUZE/BANDAGES/DRESSINGS) ×2 IMPLANT
BNDG ESMARK 4X9 LF (GAUZE/BANDAGES/DRESSINGS) ×2 IMPLANT
CORD BIPOLAR FORCEPS 12FT (ELECTRODE) ×2 IMPLANT
COVER SURGICAL LIGHT HANDLE (MISCELLANEOUS) ×2 IMPLANT
COVER WAND RF STERILE (DRAPES) IMPLANT
CUFF TOURN SGL QUICK 18X4 (TOURNIQUET CUFF) ×2 IMPLANT
CUFF TOURN SGL QUICK 24 (TOURNIQUET CUFF)
CUFF TRNQT CYL 24X4X16.5-23 (TOURNIQUET CUFF) IMPLANT
DRAPE SURG 17X11 SM STRL (DRAPES) ×2 IMPLANT
DRSG EMULSION OIL 3X3 NADH (GAUZE/BANDAGES/DRESSINGS) ×2 IMPLANT
DURAPREP 26ML APPLICATOR (WOUND CARE) ×2 IMPLANT
GAUZE SPONGE 4X4 12PLY STRL (GAUZE/BANDAGES/DRESSINGS) ×2 IMPLANT
GLOVE BIOGEL PI IND STRL 8 (GLOVE) ×2 IMPLANT
GLOVE BIOGEL PI INDICATOR 8 (GLOVE) ×2
GLOVE SURG ORTHO 8.0 STRL STRW (GLOVE) ×2 IMPLANT
GLOVE SURG SS PI 7.5 STRL IVOR (GLOVE) ×2 IMPLANT
GOWN STRL REUS W/TWL XL LVL3 (GOWN DISPOSABLE) ×4 IMPLANT
KIT BASIN OR (CUSTOM PROCEDURE TRAY) ×2 IMPLANT
KIT TURNOVER KIT A (KITS) IMPLANT
MANIFOLD NEPTUNE II (INSTRUMENTS) ×2 IMPLANT
NEEDLE HYPO 22GX1.5 SAFETY (NEEDLE) ×2 IMPLANT
NS IRRIG 1000ML POUR BTL (IV SOLUTION) ×2 IMPLANT
PACK ORTHO EXTREMITY (CUSTOM PROCEDURE TRAY) ×2 IMPLANT
PAD CAST 3X4 CTTN HI CHSV (CAST SUPPLIES) ×1 IMPLANT
PAD CAST 4YDX4 CTTN HI CHSV (CAST SUPPLIES) ×1 IMPLANT
PADDING CAST COTTON 3X4 STRL (CAST SUPPLIES) ×2
PADDING CAST COTTON 4X4 STRL (CAST SUPPLIES) ×2
PENCIL SMOKE EVACUATOR (MISCELLANEOUS) IMPLANT
PROTECTOR NERVE ULNAR (MISCELLANEOUS) ×2 IMPLANT
SUT ETHILON 3 0 PS 1 (SUTURE) ×2 IMPLANT
SUT ETHILON 4 0 PS 2 18 (SUTURE) ×2 IMPLANT
SYR CONTROL 10ML LL (SYRINGE) ×2 IMPLANT
TOWEL OR 17X26 10 PK STRL BLUE (TOWEL DISPOSABLE) ×2 IMPLANT

## 2019-11-25 NOTE — Interval H&P Note (Signed)
History and Physical Interval Note:  11/25/2019 7:37 AM  Ricardo Zuniga  has presented today for surgery, with the diagnosis of right carpal tunnel syndrome.  The various methods of treatment have been discussed with the patient and family. After consideration of risks, benefits and other options for treatment, the patient has consented to  Procedure(s): CARPAL TUNNEL RELEASE (Right) as a surgical intervention.  The patient's history has been reviewed, patient examined, no change in status, stable for surgery.  I have reviewed the patient's chart and labs.  Questions were answered to the patient's satisfaction.     Yvette Rack

## 2019-11-25 NOTE — Brief Op Note (Signed)
11/25/2019  8:23 AM  PATIENT:  Ricardo Zuniga  69 y.o. male  PRE-OPERATIVE DIAGNOSIS:  right carpal tunnel syndrome  POST-OPERATIVE DIAGNOSIS:  right carpal tunnel syndrome  PROCEDURE:  Procedure(s): CARPAL TUNNEL RELEASE (Right)  SURGEON:  Surgeon(s) and Role:    Earlie Server, MD - Primary  PHYSICIAN ASSISTANT: Chriss Czar, PA-C  ASSISTANTS:    ANESTHESIA:   local and IV sedation  EBL:  Minimal   BLOOD ADMINISTERED:none  DRAINS: none   LOCAL MEDICATIONS USED:  MARCAINE    and LIDOCAINE   SPECIMEN:  No Specimen  DISPOSITION OF SPECIMEN:  N/A  COUNTS:  YES  TOURNIQUET:   Total Tourniquet Time Documented: Upper Arm (Right) - 15 minutes Total: Upper Arm (Right) - 15 minutes   DICTATION: .Other Dictation: Dictation Number unknown  PLAN OF CARE: Discharge to home after PACU  PATIENT DISPOSITION:  PACU - hemodynamically stable.   Delay start of Pharmacological VTE agent (>24hrs) due to surgical blood loss or risk of bleeding: not applicable

## 2019-11-25 NOTE — Anesthesia Procedure Notes (Signed)
Procedure Name: MAC Date/Time: 11/25/2019 7:00 AM Performed by: West Pugh, CRNA Pre-anesthesia Checklist: Patient identified, Emergency Drugs available, Suction available, Patient being monitored and Timeout performed Patient Re-evaluated:Patient Re-evaluated prior to induction Oxygen Delivery Method: Simple face mask Preoxygenation: Pre-oxygenation with 100% oxygen Induction Type: IV induction Placement Confirmation: positive ETCO2 and breath sounds checked- equal and bilateral

## 2019-11-25 NOTE — Transfer of Care (Signed)
Immediate Anesthesia Transfer of Care Note  Patient: Ricardo Zuniga  Procedure(s) Performed: CARPAL TUNNEL RELEASE (Right )  Patient Location: PACU  Anesthesia Type:MAC  Level of Consciousness: awake, alert , oriented and patient cooperative  Airway & Oxygen Therapy: Patient Spontanous Breathing and Patient connected to nasal cannula oxygen  Post-op Assessment: Report given to RN and Post -op Vital signs reviewed and stable  Post vital signs: Reviewed and stable  Last Vitals:  Vitals Value Taken Time  BP 120/71 11/25/19 0832  Temp    Pulse 67 11/25/19 0834  Resp 16 11/25/19 0834  SpO2 97 % 11/25/19 0834  Vitals shown include unvalidated device data.  Last Pain:  Vitals:   11/25/19 0553  TempSrc:   PainSc: 0-No pain         Complications: No apparent anesthesia complications

## 2019-11-25 NOTE — Discharge Instructions (Signed)
Diet: As you were doing prior to hospitalization   Activity: Increase activity slowly as tolerated  No lifting or driving for 48 hours Encourage finger and hand range of motion Elevate the hand to reduce swelling  Shower: May shower without a dressing on post op day #3, NO SOAKING OR SUBMERGING HAND  Dressing: You may change your dressing on post op day #3.  Then change the dressing daily with sterile 4"x4"s gauze dressing  Or band aids  Weight Bearing: nonweight bearing right hand anything heavier than a coffee cup until follow up  To prevent constipation: you may use a stool softener such as -  Colace ( over the counter) 100 mg by mouth twice a day  Drink plenty of fluids ( prune juice may be helpful) and high fiber foods  Miralax ( over the counter) for constipation as needed.   Precautions: If you experience chest pain or shortness of breath - call 911 immediately For transfer to the hospital emergency department!!  If you develop a fever greater that 101 F, purulent drainage from wound, increased redness or drainage from wound, or calf pain -- Call the office   Follow- Up Appointment: Please call for an appointment to be seen in 2 weeks  [place] - [phone]

## 2019-11-25 NOTE — Op Note (Signed)
NAME: Ricardo Zuniga, Ricardo Zuniga MEDICAL RECORD [id number] ACCOUNT [id number] DATE OF BIRTH:12-26-1950 FACILITY: [facility] LOCATION: [facility] PHYSICIAN:W. Sheala Dosh JR., MD  OPERATIVE REPORT  DATE OF PROCEDURE:  11/25/2019  PREOPERATIVE DIAGNOSIS:  Right carpal tunnel syndrome.  POSTOPERATIVE DIAGNOSIS:  Right carpal tunnel syndrome.   OPERATION:  Right carpal tunnel release.  SURGEON:  Vangie Bicker, MD  ASSISTANT:  Jennet Maduro, PA  ANESTHESIA:  MAC with local supplementation.  TOURNIQUET TIME:  10 minutes.  DESCRIPTION OF PROCEDURE:  After infiltration of 4-5 mL of Xylocaine and Marcaine without epinephrine tourniquet was inflated and the arm exsanguinated to 250.  Curvilinear incision was made right hand just ulnar to the thenar crease with dissection  carried down distally to the level of superficial arch proximally to include the antebrachial fascia of the wrist.  Complete release of transverse carpal ligament was carried out.  Nerve showed moderate constriction.  No other anomalies noted.  Wound was  irrigated, closed with nylon.  Placed in a lightly compressive wrap.  Tourniquet was released after application of the dressing.  CN/NUANCE  D:11/25/2019 T:11/25/2019 JOB:[id number]

## 2019-11-26 ENCOUNTER — Other Ambulatory Visit: Payer: Self-pay | Admitting: Student

## 2019-11-28 ENCOUNTER — Encounter: Payer: Self-pay | Admitting: *Deleted

## 2019-11-28 NOTE — Anesthesia Postprocedure Evaluation (Signed)
Anesthesia Post Note  Patient: Ricardo Zuniga  Procedure(s) Performed: CARPAL TUNNEL RELEASE (Right )     Patient location during evaluation: PACU Anesthesia Type: MAC Level of consciousness: awake and alert and oriented Pain management: pain level controlled Vital Signs Assessment: post-procedure vital signs reviewed and stable Respiratory status: spontaneous breathing, nonlabored ventilation and respiratory function stable Cardiovascular status: blood pressure returned to baseline Postop Assessment: no apparent nausea or vomiting Anesthetic complications: no                 Brennan Bailey

## 2019-12-09 DIAGNOSIS — E1165 Type 2 diabetes mellitus with hyperglycemia: Secondary | ICD-10-CM | POA: Diagnosis not present

## 2020-01-08 DIAGNOSIS — E1165 Type 2 diabetes mellitus with hyperglycemia: Secondary | ICD-10-CM | POA: Diagnosis not present

## 2020-02-08 DIAGNOSIS — E1165 Type 2 diabetes mellitus with hyperglycemia: Secondary | ICD-10-CM | POA: Diagnosis not present

## 2020-02-10 DIAGNOSIS — I700 Atherosclerosis of aorta: Secondary | ICD-10-CM | POA: Diagnosis not present

## 2020-02-10 DIAGNOSIS — Z299 Encounter for prophylactic measures, unspecified: Secondary | ICD-10-CM | POA: Diagnosis not present

## 2020-02-10 DIAGNOSIS — J329 Chronic sinusitis, unspecified: Secondary | ICD-10-CM | POA: Diagnosis not present

## 2020-02-10 DIAGNOSIS — I270 Primary pulmonary hypertension: Secondary | ICD-10-CM | POA: Diagnosis not present

## 2020-02-10 DIAGNOSIS — I509 Heart failure, unspecified: Secondary | ICD-10-CM | POA: Diagnosis not present

## 2020-02-10 DIAGNOSIS — I429 Cardiomyopathy, unspecified: Secondary | ICD-10-CM | POA: Diagnosis not present

## 2020-02-20 NOTE — Progress Notes (Signed)
Cardiology Office Note  Date: 02/21/2020   ID: Ricardo Zuniga, DOB 07-25-1951, MRN [id number]  PCP:  Glenda Chroman, MD  Cardiologist:  Kate Sable, MD Electrophysiologist:  None   Chief Complaint: Follow-up chronic combined systolic/diastolic heart failure   History of Present Illness: Ricardo Zuniga is a 69 y.o. male with a history of nonischemic cardiomyopathy with LVEF 40% by echo 09/02/2018.  Last seen by Dr. Bronson Ing 09/30/2019.  He denied any symptoms of chest pain, palpitations, shortness of breath, lightheadedness, dizziness, leg swelling, orthopnea, PND, and syncope.  Tested positive for Covid in early January 2021.  No significant evidence of being hypoxic or febrile.  In relation to heart failure patient was continuing his Toprol-XL 50 mg daily and Entresto 24/26 mg p.o. twice daily.  Also taking Lasix 20 mg daily.  He was continuing his aspirin 81 mg, with Toprol XL and statin medication for CAD.  His blood pressure was normal.  Complaining of increased fatigue over the last month or so.  States he feels like he does not have any energy.  States he used to mow his yardand afterwards felt significantly fatigued.  He states anything he does recently causes him to be tired.  He denies any classic progressive anginal or exertional symptoms.  Orthostatic symptoms other than one blood pressure reading at 90/55.  Denies any palpitations or arrhythmias, stroke or TIA-like symptoms, PND, orthopnea, bleeding, claudication-like symptoms, or lower extremity edema.   Past Medical History:  Diagnosis Date  . Anal fissure   . APML (acute promyelocytic leukemia) in remission ([place])    completed treatment 04/2013  . CHF (congestive heart failure) ([place])   . Coronary artery disease    a. 50-60% RCA stenosis by cath in 2015 b. cath in 08/2018 showing 40% RCA stenosis with no obstructive CAD.   Marland Kitchen Difficult intubation   . DM type 2 (diabetes mellitus, type 2) (H[facility])    Type 2  .  Gastroesophageal reflux disease   . Heart murmur   . History of kidney stones   . Hypercholesteremia   . Myocardial infarction [facility])    " silent"  . Neuromuscular disorder (HCC)    neuropathy feet  . Nonischemic cardiomyopathy ([place])    a. EF 20% in 2015 - thought to possibly be viral induced or chemotherapy induced from treatments in 2014 b. at 35-40% by echo in 05/2016   . Peptic ulcer disease    remote  . Pneumonia   . Ureteral colic   . Wears glasses   . Wears hearing aid in both ears     Past Surgical History:  Procedure Laterality Date  . ANAL FISSURE REPAIR    . APPENDECTOMY    . CARPAL TUNNEL RELEASE Left 10/16/2017   Procedure: LEFT CARPAL TUNNEL RELEASE;  Surgeon: Earlie Server, MD;  Location: [place];  Service: Orthopedics;  Laterality: Left;  . CARPAL TUNNEL RELEASE Right 11/25/2019   Procedure: CARPAL TUNNEL RELEASE;  Surgeon: Earlie Server, MD;  Location: [facility];  Service: Orthopedics;  Laterality: Right;  . CATARACT EXTRACTION, BILATERAL    . CERVICAL DISC ARTHROPLASTY N/A 01/05/2018   Procedure: Artificial Cervical Disc ReplacementCervical Three-Four, Cervical Four-Five;  Surgeon: Kristeen Miss, MD;  Location: [facility];  Service: Neurosurgery;  Laterality: N/A;  . CERVICAL FUSION    . CHOLECYSTECTOMY    . COLONOSCOPY N/A 06/27/2015   Procedure: COLONOSCOPY;  Surgeon: Rogene Houston, MD;  [facility];  Service: Endoscopy;  Laterality:  N/A;  730  . KNEE SURGERY     x3  . LEFT AND RIGHT HEART CATHETERIZATION WITH CORONARY ANGIOGRAM N/A 12/12/2013   Procedure: LEFT AND RIGHT HEART CATHETERIZATION WITH CORONARY ANGIOGRAM;  Surgeon: [name], MD;  Location[facility] C[facility] LAB;  Service: Cardiovascular;  Laterality: N/A;  . LEFT HEART CATH AND CORONARY ANGIOGRAPHY N/A 09/17/2018   Procedure: LEFT HEART CATH AND CORONARY ANGIOGRAPHY;  Surgeon: Wellington Hampshire, MD;  Location: [facility] CV LAB;  Service: Cardiovascular;  Laterality: N/A;  . LUMBAR FUSION    .  SHOULDER ACROMIOPLASTY Right 07/18/2015   Procedure: SHOULDER ACROMIOPLASTY;  Surgeon: Earlie Server, MD;  Location: [facility];  Service: Orthopedics;  Laterality: Right;  . SHOULDER ARTHROSCOPY Right 07/18/2015   Procedure: Right shoulder arthroscopy with debridement;  Surgeon: Earlie Server, MD;  Location: [facility];  Service: Orthopedics;  Laterality: Right;  . SHOULDER SURGERY     x3  . TONSILLECTOMY       Current Outpatient Medications  Medication Sig Dispense Refill  . acetaminophen (TYLENOL) 650 MG CR tablet Take 1,300 mg by mouth every 8 (eight) hours as needed for pain.    Marland Kitchen aspirin EC 81 MG tablet Take 81 mg by mouth daily.    Marland Kitchen atorvastatin (LIPITOR) 80 MG tablet Take 40 mg by mouth every evening.     . cetirizine (ZYRTEC) 10 MG tablet Take 10 mg by mouth daily as needed for allergies.    . diazepam (VALIUM) 2 MG tablet Take 1 tablet (2 mg total) by mouth every 8 (eight) hours as needed for anxiety or muscle spasms. 20 tablet 0  . docusate sodium (COLACE) 100 MG capsule Take 200 mg by mouth at bedtime.    Marland Kitchen ENTRESTO 24-26 MG Take 1 tablet by mouth twice daily 180 tablet 2  . furosemide (LASIX) 20 MG tablet TAKE 1 TABLET BY MOUTH ONCE DAILY AS DIRECTED (Patient taking differently: Take 20 mg by mouth daily. ) 90 tablet 2  . INVOKANA 300 MG TABS Take 300 mg by mouth daily.     . metoprolol succinate (TOPROL-XL) 50 MG 24 hr tablet Take 1 tablet by mouth once daily 90 tablet 1  . omeprazole (PRILOSEC) 20 MG capsule Take 20 mg by mouth daily before breakfast.    . pantoprazole (PROTONIX) 40 MG tablet Take 40 mg by mouth daily as needed (acid reflux/indigestion).    . repaglinide (PRANDIN) 2 MG tablet Take 2 mg by mouth 3 (three) times daily before meals.     . traMADol (ULTRAM) 50 MG tablet Take 1 tablet (50 mg total) by mouth every 6 (six) hours as needed. 20 tablet 0   No current facility-administered medications for this visit.   Allergies:   Lisinopril   Social History: The patient  reports that he quit smoking about 34 years ago. His smoking use included cigarettes. He started smoking about 52 years ago. He has a 34.00 pack-year smoking history. He has never used smokeless tobacco. He reports that he does not drink alcohol and does not use drugs.   Family History: The patient's family history includes Colon cancer in an other family member.   ROS:  Please see the history of present illness. Otherwise, complete review of systems is positive for none.  All other systems are reviewed and negative.   Physical Exam: VS:  BP 122/78   Pulse 90   Ht 5\' 9"  (1.753 m)   Wt 196 lb (88.9 kg)  SpO2 98%   BMI 28.94 kg/m , BMI Body mass index is 28.94 kg/m.  Wt Readings from Last 3 Encounters:  02/21/20 196 lb (88.9 kg)  11/25/19 189 lb (85.7 kg)  11/17/19 189 lb (85.7 kg)    General: Patient appears comfortable at rest. Neck: Supple, no elevated JVP or carotid bruits, no thyromegaly. Lungs: Clear to auscultation, nonlabored breathing at rest. Cardiac: Regular rate and rhythm, no S3 or significant systolic murmur, no pericardial rub. Extremities: No pitting edema, distal pulses 2+. Skin: Warm and dry. Musculoskeletal: No kyphosis. Neuropsychiatric: Alert and oriented x3, affect grossly appropriate.  ECG:  EKG November 17, 2019 showed normal sinus rhythm rate of 86, nonspecific T wave abnormality.  Recent Labwork: 11/17/2019: BUN 18; Creatinine, Ser 0.76; Hemoglobin 17.4; Platelets 265; Potassium 3.9; Sodium 138     Component Value Date/Time   CHOL 223 (H) 12/11/2013 0723   TRIG 122 12/11/2013 0723   HDL 36 (L) 12/11/2013 0723   CHOLHDL 6.2 12/11/2013 0723   VLDL 24 12/11/2013 0723   LDLCALC 163 (H) 12/11/2013 0723    Other Studies Reviewed Today:  Echocardiogram: 09/02/2018 Study Conclusions  - Left ventricle: The cavity size was normal. Systolic function was moderately reduced. The estimated ejection fraction was  40%. Diffuse hypokinesis. Doppler parameters are consistent with abnormal left ventricular relaxation (grade 1 diastolic dysfunction). Mild posterior wall thickening. - Aortic valve: There was mild regurgitation.  NST: 09/09/2018  Nonspecific T wave abnormalities in inferolateral leads throughout study.  There was no ST segment deviation noted during stress.  Defect 1: There is a large defect of moderate severity present in the mid anterior, mid anteroseptal, mid inferoseptal, mid inferior, mid inferolateral, apical anterior and apical inferior location.  Findings consistent with prior myocardial infarction with a mild to moderate degree of peri-infarct ischemia.  This is a high risk study.  Nuclear stress EF: 34%.  Cardiac Catheterization: 09/17/2018  There is moderate to severe left ventricular systolic dysfunction.  LV end diastolic pressure is normal.  Prox RCA lesion is 40% stenosed.  1. Moderate nonobstructive one-vessel coronary artery disease. The RCA stenosis actually appears to be better than most recent cardiac catheterization in 2015. 2. Moderately severely reduced LV systolic function with an EF of 30%. Global hypokinesis. 3. Mild left ventricular end-diastolic pressure.  Recommendations: The patient has nonischemic cardiomyopathy for which I recommend continuing medical therapy. He appears to be euvolemic. Consider switching losartan to Entresto and adding spironolactone  Assessment and Plan:  1. Chronic combined systolic (congestive) and diastolic (congestive) heart failure ([place])   2. CAD in native artery   3. Essential hypertension   4. Mixed hyperlipidemia    1. Chronic combined systolic (congestive) and diastolic (congestive) heart failure (HCC) Complaining of increased exertional fatigue without increased dyspnea.  States he has to rest more often than usual.  States he has decreased energy levels.  Decrease Toprol-XL to 25 mg daily.  Get a  repeat echocardiogram sometime after August 31 when he returns from vacation.  Continue Entresto 24/26 mg p.o. twice daily.  Continue Lasix 20 mg daily.  2. CAD in native artery No progressive anginal symptoms.  Continue aspirin 81 mg,  3. Essential hypertension He is normotensive today with a blood pressure 122/78.  Continue Toprol XL 25 mg daily.  4. Mixed hyperlipidemia Last noted lipid panel in our system was 12/11/2013 where total cholesterol 223, triglycerides 122, HDL 36, LDL 163.  Continue atorvastatin 80 mg p.o. daily   Medication Adjustments/Labs and  Tests Ordered: Current medicines are reviewed at length with the patient today.  Concerns regarding medicines are outlined above.   Disposition: Follow-up with Dr. Domenic Polite or APP 2 months Signed, Levell July, NP 02/21/2020 8:01 AM    [facility] [place], [place], [place] [address] Phone: [phone]; Fax: [phone]

## 2020-02-21 ENCOUNTER — Ambulatory Visit (INDEPENDENT_AMBULATORY_CARE_PROVIDER_SITE_OTHER): Payer: Medicare PPO | Admitting: Family Medicine

## 2020-02-21 ENCOUNTER — Encounter: Payer: Self-pay | Admitting: Family Medicine

## 2020-02-21 VITALS — BP 122/78 | HR 90 | Ht 69.0 in | Wt 196.0 lb

## 2020-02-21 DIAGNOSIS — I5042 Chronic combined systolic (congestive) and diastolic (congestive) heart failure: Secondary | ICD-10-CM

## 2020-02-21 DIAGNOSIS — I2510 Atherosclerotic heart disease of native coronary artery without angina pectoris: Secondary | ICD-10-CM | POA: Diagnosis not present

## 2020-02-21 DIAGNOSIS — I10 Essential (primary) hypertension: Secondary | ICD-10-CM | POA: Diagnosis not present

## 2020-02-21 DIAGNOSIS — R0602 Shortness of breath: Secondary | ICD-10-CM

## 2020-02-21 DIAGNOSIS — E782 Mixed hyperlipidemia: Secondary | ICD-10-CM | POA: Diagnosis not present

## 2020-02-21 MED ORDER — METOPROLOL SUCCINATE ER 25 MG PO TB24: 25.0000 mg | ORAL_TABLET | Freq: Every day | ORAL | 2 refills | Status: DC

## 2020-02-21 NOTE — Patient Instructions (Addendum)
Medication Instructions:   Your physician has recommended you make the following change in your medication:   Decrease metoprolol succinate (toprol xl) to 25 mg by mouth daily. You may break your 50 mg tablet in half daily until they are finished  Continue other medications the same  Labwork:  NONE  Testing/Procedures: Your physician has requested that you have an echocardiogram. Echocardiography is a painless test that uses sound waves to create images of your heart. It provides your doctor with information about the size and shape of your heart and how well your heart's chambers and valves are working. This procedure takes approximately one hour. There are no restrictions for this procedure.  Follow-Up:  Your physician recommends that you schedule a follow-up appointment in: 2 months (office).  Any Other Special Instructions Will Be Listed Below (If Applicable).  If you need a refill on your cardiac medications before your next appointment, please call your pharmacy.

## 2020-02-22 ENCOUNTER — Ambulatory Visit (INDEPENDENT_AMBULATORY_CARE_PROVIDER_SITE_OTHER): Payer: Medicare PPO

## 2020-02-22 DIAGNOSIS — I5042 Chronic combined systolic (congestive) and diastolic (congestive) heart failure: Secondary | ICD-10-CM | POA: Diagnosis not present

## 2020-02-22 DIAGNOSIS — R0602 Shortness of breath: Secondary | ICD-10-CM | POA: Diagnosis not present

## 2020-02-24 ENCOUNTER — Telehealth: Payer: Self-pay | Admitting: *Deleted

## 2020-02-24 NOTE — Telephone Encounter (Signed)
Patient's wife informed. Copy sent to PCP 

## 2020-02-24 NOTE — Telephone Encounter (Signed)
-----   Message from Verta Ellen., NP sent at 02/22/2020  4:14 PM EDT ----- Please call the patient and let him know the pumping function of his heart has actually improved by 10 % since last echocardiogram. Previous echo showed the ejection fraction at 40%. On current echo it has improved to 50%. Near normal. He still had Grade I diastolic dysfunction which was present on his last echo. This means the ventricle is still a little stiff and has some problems when it is relaxing and trying to fill. Best management of this is to manage BP all other risk factors including his diabetes. Aortic valve is has mild to moderate leaking. This was present on his previous echo. Good to know ejection fraction has improved.

## 2020-03-09 DIAGNOSIS — E1165 Type 2 diabetes mellitus with hyperglycemia: Secondary | ICD-10-CM | POA: Diagnosis not present

## 2020-03-11 NOTE — Progress Notes (Signed)
Cardiology Office Note  Date: 03/12/2020   ID: Ricardo, Zuniga Mar 29, 1951, MRN [id number]  PCP:  Glenda Chroman, MD  Cardiologist:  Kate Sable, MD (Inactive) Electrophysiologist:  None   Chief Complaint: Follow-up chronic combined systolic/diastolic heart failure   History of Present Illness: Ricardo Zuniga is a 69 y.o. male with a history of nonischemic cardiomyopathy with LVEF 40% by echo 09/02/2018.  Last seen by Dr. Bronson Ing 09/30/2019.  He denied any symptoms of chest pain, palpitations, shortness of breath, lightheadedness, dizziness, leg swelling, orthopnea, PND, and syncope.  Tested positive for Covid in early January 2021.  No significant evidence of being hypoxic or febrile.  In relation to heart failure patient was continuing his Toprol-XL 50 mg daily and Entresto 24/26 mg p.o. twice daily.  Also taking Lasix 20 mg daily.  He was continuing his aspirin 81 mg, with Toprol XL and statin medication for CAD.  His blood pressure was normal.  Complaining of increased fatigue over the last month or so.  States he feels like he does not have any energy.  States he used to mow his yardand afterwards felt significantly fatigued.  He states anything he does recently causes him to be tired.  He denies any classic progressive anginal or exertional symptoms.  Orthostatic symptoms other than one blood pressure reading at 90/55.  Denies any palpitations or arrhythmias, stroke or TIA-like symptoms, PND, orthopnea, bleeding, claudication-like symptoms, or lower extremity edema.  He is here for follow-up today states he is feeling a little better after decreasing his Toprol-XL.  He continues to have some fatigue.  He denies any other chest pain, pressure, tightness, dyspnea, palpitations, orthostatic symptoms, syncope or presyncope, PND, orthopnea, bleeding, lower extremity edema.  We went over his echocardiogram results and discussed the fact that his ejection fraction had improved.   Past Medical History:  Diagnosis Date  . Anal fissure   . APML (acute promyelocytic leukemia) in remission ([place])    completed treatment 04/2013  . CHF (congestive heart failure) ([place])   . Coronary artery disease    a. 50-60% RCA stenosis by cath in 2015 b. cath in 08/2018 showing 40% RCA stenosis with no obstructive CAD.   Marland Kitchen Difficult intubation   . DM type 2 (diabetes mellitus, type 2) (HCC)    Type 2  . Gastroesophageal reflux disease   . Heart murmur   . History of kidney stones   . Hypercholesteremia   . Myocardial infarction [facility])    " silent"  . Neuromuscular disorder (HCC)    neuropathy feet  . Nonischemic cardiomyopathy ([place])    a. EF 20% in 2015 - thought to possibly be viral induced or chemotherapy induced from treatments in 2014 b. at 35-40% by echo in 05/2016   . Peptic ulcer disease    remote  . Pneumonia   . Ureteral colic   . Wears glasses   . Wears hearing aid in both ears     Past Surgical History:  Procedure Laterality Date  . ANAL FISSURE REPAIR    . APPENDECTOMY    . CARPAL TUNNEL RELEASE Left 10/16/2017   Procedure: LEFT CARPAL TUNNEL RELEASE;  Surgeon: Earlie Server, MD;  Location: [place];  Service: Orthopedics;  Laterality: Left;  . CARPAL TUNNEL RELEASE Right 11/25/2019   Procedure: CARPAL TUNNEL RELEASE;  Surgeon: Earlie Server, MD;  [facility];  Service: Orthopedics;  Laterality: Right;  . CATARACT EXTRACTION, BILATERAL    . CERVICAL DISC  ARTHROPLASTY N/A 01/05/2018   Procedure: Artificial Cervical Disc ReplacementCervical Three-Four, Cervical Four-Five;  Surgeon: Kristeen Miss, MD;  Location: [place];  Service: Neurosurgery;  Laterality: N/A;  . CERVICAL FUSION    . CHOLECYSTECTOMY    . COLONOSCOPY N/A 06/27/2015   Procedure: COLONOSCOPY;  Surgeon: Rogene Houston, MD;  [facility];  Service: Endoscopy;  Laterality: N/A;  730  . KNEE SURGERY     x3  . LEFT AND RIGHT HEART CATHETERIZATION WITH CORONARY ANGIOGRAM N/A 12/12/2013    Procedure: LEFT AND RIGHT HEART CATHETERIZATION WITH CORONARY ANGIOGRAM;  Surgeon: [name], MD;  Location: [facility] CATH LAB;  Service: Cardiovascular;  Laterality: N/A;  . LEFT HEART CATH AND CORONARY ANGIOGRAPHY N/A 09/17/2018   Procedure: LEFT HEART CATH AND CORONARY ANGIOGRAPHY;  Surgeon: Wellington Hampshire, MD;  Location: [place] CV LAB;  Service: Cardiovascular;  Laterality: N/A;  . LUMBAR FUSION    . SHOULDER ACROMIOPLASTY Right 07/18/2015   Procedure: SHOULDER ACROMIOPLASTY;  Surgeon: Earlie Server, MD;  Location: [place];  Service: Orthopedics;  Laterality: Right;  . SHOULDER ARTHROSCOPY Right 07/18/2015   Procedure: Right shoulder arthroscopy with debridement;  Surgeon: Earlie Server, MD;  Location: [place];  Service: Orthopedics;  Laterality: Right;  . SHOULDER SURGERY     x3  . TONSILLECTOMY       Current Outpatient Medications  Medication Sig Dispense Refill  . acetaminophen (TYLENOL) 650 MG CR tablet Take 1,300 mg by mouth every 8 (eight) hours as needed for pain.    Marland Kitchen aspirin EC 81 MG tablet Take 81 mg by mouth daily.    Marland Kitchen atorvastatin (LIPITOR) 80 MG tablet Take 40 mg by mouth every evening.     . cetirizine (ZYRTEC) 10 MG tablet Take 10 mg by mouth daily as needed for allergies.    . diazepam (VALIUM) 2 MG tablet Take 1 tablet (2 mg total) by mouth every 8 (eight) hours as needed for anxiety or muscle spasms. 20 tablet 0  . docusate sodium (COLACE) 100 MG capsule Take 200 mg by mouth at bedtime.    Marland Kitchen ENTRESTO 24-26 MG Take 1 tablet by mouth twice daily 180 tablet 2  . furosemide (LASIX) 20 MG tablet TAKE 1 TABLET BY MOUTH ONCE DAILY AS DIRECTED 90 tablet 2  . INVOKANA 300 MG TABS Take 300 mg by mouth daily.     . metoprolol succinate (TOPROL XL) 25 MG 24 hr tablet Take 0.5 tablets (12.5 mg total) by mouth daily.    Marland Kitchen omeprazole (PRILOSEC) 20 MG capsule Take 20 mg by mouth daily before breakfast.    . pantoprazole (PROTONIX) 40 MG  tablet Take 40 mg by mouth daily as needed (acid reflux/indigestion).    . repaglinide (PRANDIN) 2 MG tablet Take 2 mg by mouth 3 (three) times daily before meals.     . traMADol (ULTRAM) 50 MG tablet Take 1 tablet (50 mg total) by mouth every 6 (six) hours as needed. 20 tablet 0   No current facility-administered medications for this visit.   Allergies:  Lisinopril   Social History: The patient  reports that he quit smoking about 34 years ago. His smoking use included cigarettes. He started smoking about 52 years ago. He has a 34.00 pack-year smoking history. He has never used smokeless tobacco. He reports that he does not drink alcohol and does not use drugs.   Family History: The patient's family history includes Colon cancer in an other  family member.   ROS:  Please see the history of present illness. Otherwise, complete review of systems is positive for none.  All other systems are reviewed and negative.   Physical Exam: VS:  BP (!) 113/64   Pulse 83   Ht 5\' 9"  (1.753 m)   Wt 195 lb 12.8 oz (88.8 kg)   SpO2 97%   BMI 28.91 kg/m , BMI Body mass index is 28.91 kg/m.  Wt Readings from Last 3 Encounters:  03/12/20 195 lb 12.8 oz (88.8 kg)  02/21/20 196 lb (88.9 kg)  11/25/19 189 lb (85.7 kg)    General: Patient appears comfortable at rest. Neck: Supple, no elevated JVP or carotid bruits, no thyromegaly. Lungs: Clear to auscultation, nonlabored breathing at rest. Cardiac: Regular rate and rhythm, no S3 or significant systolic murmur, no pericardial rub. Extremities: No pitting edema, distal pulses 2+. Skin: Warm and dry. Musculoskeletal: No kyphosis. Neuropsychiatric: Alert and oriented x3, affect grossly appropriate.  ECG:  EKG November 17, 2019 showed normal sinus rhythm rate of 86, nonspecific T wave abnormality.  Recent Labwork: 11/17/2019: BUN 18; Creatinine, Ser 0.76; Hemoglobin 17.4; Platelets 265; Potassium 3.9; Sodium 138     Component Value Date/Time   CHOL 223 (H)  12/11/2013 0723   TRIG 122 12/11/2013 0723   HDL 36 (L) 12/11/2013 0723   CHOLHDL 6.2 12/11/2013 0723   VLDL 24 12/11/2013 0723   LDLCALC 163 (H) 12/11/2013 0723    Other Studies Reviewed Today:  Echocardiogram 02/22/2020  1. Left ventricular ejection fraction, by estimation, is 50%. The left ventricle has normal function. The left ventricle has no regional wall motion abnormalities. Left ventricular diastolic parameters are consistent with Grade I diastolic dysfunction (impaired relaxation). 2. Right ventricular systolic function is normal. The right ventricular size is normal. There is normal pulmonary artery systolic pressure. 3. The mitral valve is normal in structure. No evidence of mitral valve regurgitation. No evidence of mitral stenosis. 4. The aortic valve is tricuspid. Aortic valve regurgitation is mild to moderate. No aortic stenosis is present. Comparison(s): Echocardiogram done 09/02/18 showed an EF of 40%.     Echocardiogram: 09/02/2018 Study Conclusions  - Left ventricle: The cavity size was normal. Systolic function was moderately reduced. The estimated ejection fraction was 40%. Diffuse hypokinesis. Doppler parameters are consistent with abnormal left ventricular relaxation (grade 1 diastolic dysfunction). Mild posterior wall thickening. - Aortic valve: There was mild regurgitation.  NST: 09/09/2018  Nonspecific T wave abnormalities in inferolateral leads throughout study.  There was no ST segment deviation noted during stress.  Defect 1: There is a large defect of moderate severity present in the mid anterior, mid anteroseptal, mid inferoseptal, mid inferior, mid inferolateral, apical anterior and apical inferior location.  Findings consistent with prior myocardial infarction with a mild to moderate degree of peri-infarct ischemia.  This is a high risk study.  Nuclear stress EF: 34%.  Cardiac Catheterization: 09/17/2018  There is moderate  to severe left ventricular systolic dysfunction.  LV end diastolic pressure is normal.  Prox RCA lesion is 40% stenosed.  1. Moderate nonobstructive one-vessel coronary artery disease. The RCA stenosis actually appears to be better than most recent cardiac catheterization in 2015. 2. Moderately severely reduced LV systolic function with an EF of 30%. Global hypokinesis. 3. Mild left ventricular end-diastolic pressure.  Recommendations: The patient has nonischemic cardiomyopathy for which I recommend continuing medical therapy. He appears to be euvolemic. Consider switching losartan to Entresto and adding spironolactone  Assessment and Plan:  1. Chronic combined systolic (congestive) and diastolic (congestive) heart failure ([place]) He is here today and states he still having some fatigue but is energy level has gotten better since decreasing the Toprol dosage.  Decrease Toprol-XL from 25 mg to 12.5 mg.  Continue Entresto 24/26 mg p.o. daily, continue Lasix 20 mg daily.   Decrease Toprol-XL to 25 mg daily.  EF is improved on recent echocardiogram from 40% to 50%.  2. CAD in native artery No progressive anginal symptoms.  Continue aspirin 81 mg,  3. Essential hypertension He is normotensive today with a blood pressure 113/64.  Decrease Toprol to 12.5 mg p.o. daily.  4. Mixed hyperlipidemia Last noted lipid panel in our system was 12/11/2013 where total cholesterol 223, triglycerides 122, HDL 36, LDL 163.  Continue atorvastatin 80 mg p.o. daily   Medication Adjustments/Labs and Tests Ordered: Current medicines are reviewed at length with the patient today.  Concerns regarding medicines are outlined above.   Disposition: Follow-up with Dr. Domenic Polite or APP 6 months. Signed, Levell July, NP 03/12/2020 8:40 AM    [facility], [place], [place] [address] Phone: [phone]; Fax: [phone]

## 2020-03-12 ENCOUNTER — Other Ambulatory Visit: Payer: Self-pay

## 2020-03-12 ENCOUNTER — Encounter: Payer: Self-pay | Admitting: Family Medicine

## 2020-03-12 ENCOUNTER — Ambulatory Visit (INDEPENDENT_AMBULATORY_CARE_PROVIDER_SITE_OTHER): Payer: Medicare PPO | Admitting: Family Medicine

## 2020-03-12 VITALS — BP 113/64 | HR 83 | Ht 69.0 in | Wt 195.8 lb

## 2020-03-12 DIAGNOSIS — I5042 Chronic combined systolic (congestive) and diastolic (congestive) heart failure: Secondary | ICD-10-CM | POA: Diagnosis not present

## 2020-03-12 DIAGNOSIS — I10 Essential (primary) hypertension: Secondary | ICD-10-CM | POA: Diagnosis not present

## 2020-03-12 DIAGNOSIS — I2510 Atherosclerotic heart disease of native coronary artery without angina pectoris: Secondary | ICD-10-CM | POA: Diagnosis not present

## 2020-03-12 DIAGNOSIS — E782 Mixed hyperlipidemia: Secondary | ICD-10-CM | POA: Diagnosis not present

## 2020-03-12 MED ORDER — METOPROLOL SUCCINATE ER 25 MG PO TB24: 12.5000 mg | ORAL_TABLET | Freq: Every day | ORAL | Status: DC

## 2020-03-12 NOTE — Patient Instructions (Signed)
Medication Instructions:  Decrease Toprol XL to 12.5mg daily.  Continue all other medications.     Labwork: none  Testing/Procedures: none  Follow-Up: 6 months    Any Other Special Instructions Will Be Listed Below (If Applicable).   If you need a refill on your cardiac medications before your next appointment, please call your pharmacy.  

## 2020-03-29 ENCOUNTER — Ambulatory Visit: Payer: Medicare PPO | Admitting: Cardiovascular Disease

## 2020-04-09 ENCOUNTER — Other Ambulatory Visit: Payer: Self-pay | Admitting: *Deleted

## 2020-04-09 MED ORDER — FUROSEMIDE 20 MG PO TABS: ORAL_TABLET | ORAL | 1 refills | Status: DC

## 2020-04-10 DIAGNOSIS — E1165 Type 2 diabetes mellitus with hyperglycemia: Secondary | ICD-10-CM | POA: Diagnosis not present

## 2020-04-11 DIAGNOSIS — H26493 Other secondary cataract, bilateral: Secondary | ICD-10-CM | POA: Diagnosis not present

## 2020-04-25 DIAGNOSIS — E1165 Type 2 diabetes mellitus with hyperglycemia: Secondary | ICD-10-CM | POA: Diagnosis not present

## 2020-05-18 DIAGNOSIS — H6091 Unspecified otitis externa, right ear: Secondary | ICD-10-CM | POA: Diagnosis not present

## 2020-05-18 DIAGNOSIS — I270 Primary pulmonary hypertension: Secondary | ICD-10-CM | POA: Diagnosis not present

## 2020-05-18 DIAGNOSIS — L309 Dermatitis, unspecified: Secondary | ICD-10-CM | POA: Diagnosis not present

## 2020-05-18 DIAGNOSIS — E1165 Type 2 diabetes mellitus with hyperglycemia: Secondary | ICD-10-CM | POA: Diagnosis not present

## 2020-05-18 DIAGNOSIS — Z299 Encounter for prophylactic measures, unspecified: Secondary | ICD-10-CM | POA: Diagnosis not present

## 2020-05-18 DIAGNOSIS — I509 Heart failure, unspecified: Secondary | ICD-10-CM | POA: Diagnosis not present

## 2020-06-07 ENCOUNTER — Encounter (INDEPENDENT_AMBULATORY_CARE_PROVIDER_SITE_OTHER): Payer: Self-pay | Admitting: *Deleted

## 2020-06-11 DIAGNOSIS — C9241 Acute promyelocytic leukemia, in remission: Secondary | ICD-10-CM | POA: Diagnosis not present

## 2020-06-21 ENCOUNTER — Encounter (INDEPENDENT_AMBULATORY_CARE_PROVIDER_SITE_OTHER): Payer: Self-pay | Admitting: *Deleted

## 2020-06-21 ENCOUNTER — Ambulatory Visit (INDEPENDENT_AMBULATORY_CARE_PROVIDER_SITE_OTHER): Payer: Medicare PPO | Admitting: Gastroenterology

## 2020-06-21 ENCOUNTER — Other Ambulatory Visit: Payer: Self-pay

## 2020-06-21 ENCOUNTER — Encounter (INDEPENDENT_AMBULATORY_CARE_PROVIDER_SITE_OTHER): Payer: Self-pay | Admitting: Gastroenterology

## 2020-06-21 ENCOUNTER — Telehealth (INDEPENDENT_AMBULATORY_CARE_PROVIDER_SITE_OTHER): Payer: Self-pay | Admitting: *Deleted

## 2020-06-21 VITALS — BP 118/73 | HR 91 | Temp 98.1°F | Ht 69.0 in | Wt 193.9 lb

## 2020-06-21 DIAGNOSIS — Z86010 Personal history of colonic polyps: Secondary | ICD-10-CM | POA: Diagnosis not present

## 2020-06-21 DIAGNOSIS — R079 Chest pain, unspecified: Secondary | ICD-10-CM | POA: Diagnosis not present

## 2020-06-21 DIAGNOSIS — K219 Gastro-esophageal reflux disease without esophagitis: Secondary | ICD-10-CM | POA: Diagnosis not present

## 2020-06-21 DIAGNOSIS — K589 Irritable bowel syndrome without diarrhea: Secondary | ICD-10-CM | POA: Insufficient documentation

## 2020-06-21 DIAGNOSIS — K581 Irritable bowel syndrome with constipation: Secondary | ICD-10-CM | POA: Diagnosis not present

## 2020-06-21 MED ORDER — PLENVU 140 G PO SOLR: 1.0000 | Freq: Once | ORAL | 0 refills | Status: AC

## 2020-06-21 NOTE — Patient Instructions (Addendum)
Schedule EGD and colonoscopy Start taking Miralax 1 cap every day for one week. If bowel movements do not improve, increase to 1 cup every 12 hours. If after two weeks there is no improvement, increase to 1 cup every 8 hours Stop docusate Explained presumed etiology of IBS symptoms. Patient was counseled about the benefit of implementing a low FODMAP to improve symptoms and recurrent episodes. A dietary list was provided to the patient. Also, the patient was counseled about the benefit of avoiding stressing situations and potential environmental triggers leading to symptomatology. Continue Prilosec in AM and Pepcid at night

## 2020-06-21 NOTE — Telephone Encounter (Signed)
Patient needs Plenvu (copay card) ° °

## 2020-06-21 NOTE — Progress Notes (Signed)
Ricardo Zuniga, M.D. Gastroenterology & Hepatology [facility] [address] [place], [place] [address] Primary Care Physician: Glenda Chroman, MD [address] [place] [address]  Referring MD: PCP  I will communicate my assessment and recommendations to the referring MD via EMR. Note: Occasional unusual wording and randomly placed punctuation marks may result from the use of speech recognition technology to transcribe this document"  Chief Complaint: Chest pain, constipation and abdominal pain  History of Present Illness: Ricardo Zuniga is a 69 y.o. male with PMH APML, CHF with EF 50%, CAD, DM GERD, who presents for evaluation of chest pain, constipation and abdominal pain.  Patient reports that for the last month he has presented episodes of chest and abdominal pain. Describes he has episodes of chest pain as intermittent pressure, which he reports gets better when he takes Pepcid (has taken Prilosec for multiple years but does not think it helps, has taken it for years). He states he has very occasional heartburn episodes, but his wife reports he has presented some episodes of regurgitation the last month. He takes Gaviscon as needed to improve the regurgitation, which partially relieves his symptoms. Also, the patient describes his abdominal pain s a cramping pain in the lower abdomen, which usually improves after having a BM. It happens every 2-3 days.  The patient is concerned as he had similar symptoms in 2008 and was found to have an ulcer but he does not know if he had H. pylori at that time.  States he usually is regular with his BMs. He reports that he has noticed that even if he has a soft BM he will have to strain significantly to move his bowels. He takes colace daily.  Has taken occasionally magnesium citrate when he felt "very backed up", which helped relieving his constipation symptoms.  Patient reports a history of an anal  stricture in the past that was operated some years ago.  The patient reports that he was seen by his cardiologist recently, he had no evidence of ongoing cardiac problems.  In fact, his ejection fraction had improved to 50% based on most recent echocardiogram while on medication for this.  The patient denies having any nausea, vomiting, fever, chills, hematochezia, melena, hematemesis, abdominal distention, abdominal pain, diarrhea, jaundice, pruritus. Has gained couple of pounds recently.  Last EGD: 2008 - had gastric ulcers Last Colonoscopy: 2016 - small polyp in mid transverse, diverticulosis. Path tubular adenoma, repeat in 5 years per recs  FHx: neg for any gastrointestinal/liver disease, grandfather colon cancer in 58s, uncle pancreatic cancer Social: neg smoking, alcohol or illicit drug use Surgical: non contributory  Past Medical History: Past Medical History:  Diagnosis Date  . Anal fissure   . APML (acute promyelocytic leukemia) in remission ([name])    completed treatment 04/2013  . CHF (congestive heart failure) ([place])   . Coronary artery disease    a. 50-60% RCA stenosis by cath in 2015 b. cath in 08/2018 showing 40% RCA stenosis with no obstructive CAD.   Marland Kitchen Difficult intubation   . DM type 2 (diabetes mellitus, type 2) (HCC)    Type 2  . Gastroesophageal reflux disease   . Heart murmur   . History of kidney stones   . Hypercholesteremia   . Myocardial infarction [facility])    " silent"  . Neuromuscular disorder (HCC)    neuropathy feet  . Nonischemic cardiomyopathy ([place])    a. EF 20% in 2015 -  thought to possibly be viral induced or chemotherapy induced from treatments in 2014 b. at 35-40% by echo in 05/2016   . Peptic ulcer disease    remote  . Pneumonia   . Ureteral colic   . Wears glasses   . Wears hearing aid in both ears     Past Surgical History: Past Surgical History:  Procedure Laterality Date  . ANAL FISSURE REPAIR    . APPENDECTOMY    . CARPAL TUNNEL  RELEASE Left 10/16/2017   Procedure: LEFT CARPAL TUNNEL RELEASE;  Surgeon: Earlie Server, MD;  Location: [place];  Service: Orthopedics;  Laterality: Left;  . CARPAL TUNNEL RELEASE Right 11/25/2019   Procedure: CARPAL TUNNEL RELEASE;  Surgeon: Earlie Server, MD;  [facility];  Service: Orthopedics;  Laterality: Right;  . CATARACT EXTRACTION, BILATERAL    . CERVICAL DISC ARTHROPLASTY N/A 01/05/2018   Procedure: Artificial Cervical Disc ReplacementCervical Three-Four, Cervical Four-Five;  Surgeon: Kristeen Miss, MD;  Location: [place];  Service: Neurosurgery;  Laterality: N/A;  . CERVICAL FUSION    . CHOLECYSTECTOMY    . COLONOSCOPY N/A 06/27/2015   Procedure: COLONOSCOPY;  Surgeon: Rogene Houston, MD;  [facility];  Service: Endoscopy;  Laterality: N/A;  730  . KNEE SURGERY     x3  . LEFT AND RIGHT HEART CATHETERIZATION WITH CORONARY ANGIOGRAM N/A 12/12/2013   Procedure: LEFT AND RIGHT HEART CATHETERIZATION WITH CORONARY ANGIOGRAM;  Surgeon: [name], MD;  Location: [facility] CATH LAB;  Service: Cardiovascular;  Laterality: N/A;  . LEFT HEART CATH AND CORONARY ANGIOGRAPHY N/A 09/17/2018   Procedure: LEFT HEART CATH AND CORONARY ANGIOGRAPHY;  Surgeon: Wellington Hampshire, MD;  Location: [place] CV LAB;  Service: Cardiovascular;  Laterality: N/A;  . LUMBAR FUSION    . SHOULDER ACROMIOPLASTY Right 07/18/2015   Procedure: SHOULDER ACROMIOPLASTY;  Surgeon: Earlie Server, MD;  Location: [place];  Service: Orthopedics;  Laterality: Right;  . SHOULDER ARTHROSCOPY Right 07/18/2015   Procedure: Right shoulder arthroscopy with debridement;  Surgeon: Earlie Server, MD;  Location: [place];  Service: Orthopedics;  Laterality: Right;  . SHOULDER SURGERY     x3  . TONSILLECTOMY      Family History: Family History  Problem Relation Age of Onset  . Colon cancer Other     Social History: Social History   Tobacco Use  Smoking Status Former Smoker  .  Packs/day: 2.00  . Years: 17.00  . Pack years: 34.00  . Types: Cigarettes  . Start date: 02/17/1968  . Quit date: 02/28/1986  . Years since quitting: 34.3  Smokeless Tobacco Never Used   Social History   Substance and Sexual Activity  Alcohol Use No  . Alcohol/week: 0.0 standard drinks   Social History   Substance and Sexual Activity  Drug Use No    Allergies: Allergies  Allergen Reactions  . Lisinopril Cough    Medications: Current Outpatient Medications  Medication Sig Dispense Refill  . acetaminophen (TYLENOL) 650 MG CR tablet Take 1,300 mg by mouth every 8 (eight) hours as needed for pain.    Marland Kitchen aspirin EC 81 MG tablet Take 81 mg by mouth daily.    Marland Kitchen atorvastatin (LIPITOR) 80 MG tablet Take 40 mg by mouth every evening.     . cetirizine (ZYRTEC) 10 MG tablet Take 10 mg by mouth daily as needed for allergies.    . diazepam (VALIUM) 2 MG tablet Take 1 tablet (2 mg total) by mouth  every 8 (eight) hours as needed for anxiety or muscle spasms. 20 tablet 0  . docusate sodium (COLACE) 100 MG capsule Take 200 mg by mouth at bedtime.    Marland Kitchen ENTRESTO 24-26 MG Take 1 tablet by mouth twice daily 180 tablet 2  . furosemide (LASIX) 20 MG tablet TAKE 1 TABLET BY MOUTH ONCE DAILY AS DIRECTED 90 tablet 1  . INVOKANA 300 MG TABS Take 300 mg by mouth daily.     . metoprolol succinate (TOPROL XL) 25 MG 24 hr tablet Take 0.5 tablets (12.5 mg total) by mouth daily.    Marland Kitchen omeprazole (PRILOSEC) 20 MG capsule Take 20 mg by mouth daily before breakfast.    . repaglinide (PRANDIN) 2 MG tablet Take 2 mg by mouth 3 (three) times daily before meals.     . traMADol (ULTRAM) 50 MG tablet Take 1 tablet (50 mg total) by mouth every 6 (six) hours as needed. 20 tablet 0   No current facility-administered medications for this visit.    Review of Systems: GENERAL: negative for malaise, night sweats HEENT: No changes in hearing or vision, no nose bleeds or other nasal problems. NECK: Negative for lumps,  goiter, pain and significant neck swelling RESPIRATORY: Negative for cough, wheezing CARDIOVASCULAR: Negative for chest pain, leg swelling, palpitations, orthopnea GI: SEE HPI MUSCULOSKELETAL: Negative for joint pain or swelling, back pain, and muscle pain. SKIN: Negative for lesions, rash PSYCH: Negative for sleep disturbance, mood disorder and recent psychosocial stressors. HEMATOLOGY Negative for prolonged bleeding, bruising easily, and swollen nodes. ENDOCRINE: Negative for cold or heat intolerance, polyuria, polydipsia and goiter. NEURO: negative for tremor, gait imbalance, syncope and seizures. The remainder of the review of systems is noncontributory.   Physical Exam: BP 118/73 (BP Location: Right Arm, Patient Position: Sitting, Cuff Size: Large)   Pulse 91   Temp 98.1 F (36.7 C) (Oral)   Ht 5\' 9"  (1.753 m)   Wt 193 lb 14.4 oz (88 kg)   BMI 28.63 kg/m  GENERAL: The patient is AO x3, in no acute distress. HEENT: Head is normocephalic and atraumatic. EOMI are intact. Mouth is well hydrated and without lesions. NECK: Supple. No masses LUNGS: Clear to auscultation. No presence of rhonchi/wheezing/rales. Adequate chest expansion HEART: RRR, normal s1 and s2. ABDOMEN: mildly tender upon palpation of the lower abdomen but no guarding, no peritoneal signs, and nondistended. BS +. No masses. EXTREMITIES: Without any cyanosis, clubbing, rash, lesions or edema. NEUROLOGIC: AOx3, no focal motor deficit. SKIN: no jaundice, no rashes   Imaging/Labs: as above  I personally reviewed and interpreted the available labs, imaging and endoscopic files.  Impression and Plan: Ricardo Zuniga is a 69 y.o. male with PMH APML, CHF with EF 50%, CAD, DM GERD, who presents for evaluation of chest pain, constipation and abdominal pain.  I consider that given the chronicity and constellation of his symptoms, it is very likely that he has developed bowel hypersensitivity/irritable bowel syndrome.  He  has not presented any red flag signs, but given his age we will proceed with an EGD to explore his symptoms further.  The patient is also due for colorectal cancer screening, we will schedule a colonoscopy at the same time.  The patient was provided dietary list consisting of a low FODMAP diet to improve his symptomatology.  I believe that as his pain is related to his bowel movements, he may improve by increasing his bowel movement frequency, I advised him to start taking MiraLAX on a daily  basis and to stop his docusate intake.  Finally, I advised him that he could continue taking the Prilosec for his GERD but as he has felt relieved with intake of Pepcid he could take this every single night.  Patient understood and agreed.  - Schedule EGD and colonoscopy - Start taking Miralax 1 cap every day for one week. If bowel movements do not improve, increase to 1 cup every 12 hours. If after two weeks there is no improvement, increase to 1 cup every 8 hours - Stop docusate - Explained presumed etiology of IBS symptoms. Patient was counseled about the benefit of implementing a low FODMAP to improve symptoms and recurrent episodes. A dietary list was provided to the patient. Also, the patient was counseled about the benefit of avoiding stressing situations and potential environmental triggers leading to symptomatology. - Continue Prilosec 20 mg in AM and Pepcid at night - RTC 3 months  All questions were answered.      Ricardo Peppers, MD Gastroenterology and Hepatology [facility]

## 2020-07-18 ENCOUNTER — Other Ambulatory Visit (INDEPENDENT_AMBULATORY_CARE_PROVIDER_SITE_OTHER): Payer: Self-pay | Admitting: *Deleted

## 2020-08-05 ENCOUNTER — Encounter (HOSPITAL_COMMUNITY): Payer: Self-pay

## 2020-08-05 ENCOUNTER — Emergency Department (HOSPITAL_COMMUNITY)
Admission: EM | Admit: 2020-08-05 | Discharge: 2020-08-05 | Disposition: A | Discharge status: Home / Self Care | Payer: Medicare PPO | Attending: Emergency Medicine | Admitting: Emergency Medicine

## 2020-08-05 ENCOUNTER — Other Ambulatory Visit: Payer: Self-pay

## 2020-08-05 DIAGNOSIS — Z96611 Presence of right artificial shoulder joint: Secondary | ICD-10-CM | POA: Diagnosis not present

## 2020-08-05 DIAGNOSIS — Z20822 Contact with and (suspected) exposure to covid-19: Secondary | ICD-10-CM | POA: Insufficient documentation

## 2020-08-05 DIAGNOSIS — I110 Hypertensive heart disease with heart failure: Secondary | ICD-10-CM | POA: Diagnosis not present

## 2020-08-05 DIAGNOSIS — E119 Type 2 diabetes mellitus without complications: Secondary | ICD-10-CM | POA: Insufficient documentation

## 2020-08-05 DIAGNOSIS — Z79899 Other long term (current) drug therapy: Secondary | ICD-10-CM | POA: Insufficient documentation

## 2020-08-05 DIAGNOSIS — B9789 Other viral agents as the cause of diseases classified elsewhere: Secondary | ICD-10-CM | POA: Diagnosis not present

## 2020-08-05 DIAGNOSIS — I2510 Atherosclerotic heart disease of native coronary artery without angina pectoris: Secondary | ICD-10-CM | POA: Insufficient documentation

## 2020-08-05 DIAGNOSIS — Z7982 Long term (current) use of aspirin: Secondary | ICD-10-CM | POA: Insufficient documentation

## 2020-08-05 DIAGNOSIS — I509 Heart failure, unspecified: Secondary | ICD-10-CM | POA: Diagnosis not present

## 2020-08-05 DIAGNOSIS — Z87891 Personal history of nicotine dependence: Secondary | ICD-10-CM | POA: Diagnosis not present

## 2020-08-05 DIAGNOSIS — J069 Acute upper respiratory infection, unspecified: Secondary | ICD-10-CM

## 2020-08-05 DIAGNOSIS — R0981 Nasal congestion: Secondary | ICD-10-CM | POA: Diagnosis present

## 2020-08-05 LAB — RESP PANEL BY RT-PCR (FLU A&B, COVID) ARPGX2
Influenza A by PCR: NEGATIVE
Influenza B by PCR: NEGATIVE
SARS Coronavirus 2 by RT PCR: NEGATIVE

## 2020-08-05 MED ORDER — DM-GUAIFENESIN ER 30-600 MG PO TB12: 1.0000 | ORAL_TABLET | Freq: Two times a day (BID) | ORAL | 1 refills | Status: DC

## 2020-08-05 NOTE — Discharge Instructions (Signed)
Blood pressure high here today following up at home. Would recommend Mucinex DM over-the-counter for the congestion and cough. Covid test negative copy of results provided with your discharge. Tylenol for fever as needed. Return for any new or worse symptoms.

## 2020-08-05 NOTE — ED Triage Notes (Signed)
Pt presents to ED with complaints of nasal and head congestion x 1 week. Pt states he has had his Covid vaccines. Pt family member has been positive for covid. Pt states he has had negative covid test Wednesday.

## 2020-08-05 NOTE — ED Provider Notes (Signed)
[facility] EMERGENCY DEPARTMENT Provider Note   CSN: [id number] Arrival date & time: 08/05/20  [id number]     History Chief Complaint  Patient presents with  . Nasal Congestion    Ricardo Zuniga is a 69 y.o. male.  Patient with 1 week history of nasal congestion cough states a low-grade fever. No diarrhea no shortness of breath no air hunger. Does have a family member daughter-in-law that tested positive for Covid but is now asymptomatic. Patient had a Covid test on Wednesday. PCR which was negative. Has been doing home Covid testing and they have been negative this week. Patient still concerned that he may be coming down with Covid. Historically patient had a positive Covid test in January 2021 but did not really have any significant symptoms. Patient has been fully vaccinated including the booster.        Past Medical History:  Diagnosis Date  . Anal fissure   . APML (acute promyelocytic leukemia) in remission ([place])    completed treatment 04/2013  . CHF (congestive heart failure) ([place])   . Coronary artery disease    a. 50-60% RCA stenosis by cath in 2015 b. cath in 08/2018 showing 40% RCA stenosis with no obstructive CAD.   Marland Kitchen Difficult intubation   . DM type 2 (diabetes mellitus, type 2) (HCC)    Type 2  . Gastroesophageal reflux disease   . Heart murmur   . History of kidney stones   . Hypercholesteremia   . Myocardial infarction [facility])    " silent"  . Neuromuscular disorder (HCC)    neuropathy feet  . Nonischemic cardiomyopathy ([place])    a. EF 20% in 2015 - thought to possibly be viral induced or chemotherapy induced from treatments in 2014 b. at 35-40% by echo in 05/2016   . Peptic ulcer disease    remote  . Pneumonia   . Ureteral colic   . Wears glasses   . Wears hearing aid in both ears     Patient Active Problem List   Diagnosis Date Noted  . IBS (irritable bowel syndrome) 06/21/2020  . History of colonic polyps 06/21/2020  . Abnormal stress test   . DOE  (dyspnea on exertion)   . Cervical spondylosis with myelopathy 01/05/2018  . Transient hypotension 05/17/2016  . GERD (gastroesophageal reflux disease) 05/17/2016  . Chest pain 05/16/2016  . Normal coronary arteries- cath 12/13/13 12/14/2013  . Dyslipidemia 12/14/2013  . Unstable angina ([place]) 12/11/2013  . Intermediate coronary syndrome ([place]) 12/11/2013  . Acute combined systolic and diastolic heart failure ([place]) 12/10/2013  . DM type 2 (diabetes mellitus, type 2) ([place]) 12/10/2013  . APML (acute promyelocytic leukemia) in remission ([place]) 12/10/2013  . Congestive dilated cardiomyopathy- EF 20% at cath 12/13/13 08/10/2012  . Abnormal echocardiogram 07/01/2012  . Aortic insufficiency 07/01/2012  . Hyperlipidemia 07/01/2012  . PERS HX TOBACCO USE PRESENTING HAZARDS HEALTH 01/14/2010  . Essential hypertension 01/13/2010  . CHEST PAIN 01/13/2010    Past Surgical History:  Procedure Laterality Date  . ANAL FISSURE REPAIR    . APPENDECTOMY    . CARPAL TUNNEL RELEASE Left 10/16/2017   Procedure: LEFT CARPAL TUNNEL RELEASE;  Surgeon: Earlie Server, MD;  Location: [place];  Service: Orthopedics;  Laterality: Left;  . CARPAL TUNNEL RELEASE Right 11/25/2019   Procedure: CARPAL TUNNEL RELEASE;  Surgeon: Earlie Server, MD;  [facility];  Service: Orthopedics;  Laterality: Right;  . CATARACT EXTRACTION, BILATERAL    . CERVICAL DISC ARTHROPLASTY N/A 01/05/2018  Procedure: Artificial Cervical Disc ReplacementCervical Three-Four, Cervical Four-Five;  Surgeon: Kristeen Miss, MD;  Location: [place];  Service: Neurosurgery;  Laterality: N/A;  . CERVICAL FUSION    . CHOLECYSTECTOMY    . COLONOSCOPY N/A 06/27/2015   Procedure: COLONOSCOPY;  Surgeon: Rogene Houston, MD;  [facility];  Service: Endoscopy;  Laterality: N/A;  730  . KNEE SURGERY     x3  . LEFT AND RIGHT HEART CATHETERIZATION WITH CORONARY ANGIOGRAM N/A 12/12/2013   Procedure: LEFT AND RIGHT HEART CATHETERIZATION WITH CORONARY  ANGIOGRAM;  Surgeon: [name], MD;  Location: [facility] CATH LAB;  Service: Cardiovascular;  Laterality: N/A;  . LEFT HEART CATH AND CORONARY ANGIOGRAPHY N/A 09/17/2018   Procedure: LEFT HEART CATH AND CORONARY ANGIOGRAPHY;  Surgeon: Wellington Hampshire, MD;  Location: [place] CV LAB;  Service: Cardiovascular;  Laterality: N/A;  . LUMBAR FUSION    . SHOULDER ACROMIOPLASTY Right 07/18/2015   Procedure: SHOULDER ACROMIOPLASTY;  Surgeon: Earlie Server, MD;  Location: [place];  Service: Orthopedics;  Laterality: Right;  . SHOULDER ARTHROSCOPY Right 07/18/2015   Procedure: Right shoulder arthroscopy with debridement;  Surgeon: Earlie Server, MD;  Location: [place];  Service: Orthopedics;  Laterality: Right;  . SHOULDER SURGERY     x3  . TONSILLECTOMY         Family History  Problem Relation Age of Onset  . Colon cancer Other     Social History   Tobacco Use  . Smoking status: Former Smoker    Packs/day: 2.00    Years: 17.00    Pack years: 34.00    Types: Cigarettes    Start date: 02/17/1968    Quit date: 02/28/1986    Years since quitting: 34.4  . Smokeless tobacco: Never Used  Vaping Use  . Vaping Use: Never used  Substance Use Topics  . Alcohol use: No    Alcohol/week: 0.0 standard drinks  . Drug use: No    Home Medications Prior to Admission medications   Medication Sig Start Date End Date Taking? Authorizing Provider  acetaminophen (TYLENOL) 650 MG CR tablet Take 1,300 mg by mouth every 8 (eight) hours as needed for pain.    [provider]  aspirin EC 81 MG tablet Take 81 mg by mouth daily.    [provider]  atorvastatin (LIPITOR) 80 MG tablet Take 40 mg by mouth every evening.  04/29/16   [provider]  cetirizine (ZYRTEC) 10 MG tablet Take 10 mg by mouth daily as needed for allergies.    [provider]  dextromethorphan-guaiFENesin (MUCINEX DM) 30-600 MG 12hr tablet Take 1 tablet by mouth 2 (two)  times daily. 08/05/20   Fredia Sorrow, MD  diazepam (VALIUM) 2 MG tablet Take 1 tablet (2 mg total) by mouth every 8 (eight) hours as needed for anxiety or muscle spasms. 11/25/19 11/24/20  Chadwell, Vonna Kotyk, PA-C  docusate sodium (COLACE) 100 MG capsule Take 200 mg by mouth at bedtime.    [provider]  ENTRESTO 24-26 MG Take 1 tablet by mouth twice daily 11/28/19   Herminio Commons, MD  furosemide (LASIX) 20 MG tablet TAKE 1 TABLET BY MOUTH ONCE DAILY AS DIRECTED 04/09/20   Verta Ellen., NP  INVOKANA 300 MG TABS Take 300 mg by mouth daily.  12/05/13   [provider]  metoprolol succinate (TOPROL XL) 25 MG 24 hr tablet Take 0.5 tablets (12.5 mg total) by mouth daily. 03/12/20  Verta Ellen., NP  omeprazole (PRILOSEC) 20 MG capsule Take 20 mg by mouth daily before breakfast.    [provider]  repaglinide (PRANDIN) 2 MG tablet Take 2 mg by mouth 3 (three) times daily before meals.  07/06/17   [provider]  traMADol (ULTRAM) 50 MG tablet Take 1 tablet (50 mg total) by mouth every 6 (six) hours as needed. 11/25/19 11/24/20  Chadwell, Vonna Kotyk, PA-C    Allergies    Lisinopril  Review of Systems   Review of Systems  Constitutional: Positive for fever. Negative for chills.  HENT: Positive for congestion. Negative for rhinorrhea and sore throat.   Eyes: Negative for visual disturbance.  Respiratory: Positive for cough. Negative for shortness of breath.   Cardiovascular: Negative for chest pain and leg swelling.  Gastrointestinal: Negative for abdominal pain, diarrhea, nausea and vomiting.  Genitourinary: Negative for dysuria.  Musculoskeletal: Negative for back pain and neck pain.  Skin: Negative for rash.  Neurological: Negative for dizziness, light-headedness and headaches.  Hematological: Does not bruise/bleed easily.  Psychiatric/Behavioral: Negative for confusion.    Physical Exam Updated Vital Signs BP (!) 172/94 (BP Location: Left  Arm)   Pulse (!) 111   Temp 98.6 F (37 C) (Oral)   Resp 20   Ht 1.765 m (5' 9.5")   Wt 84.4 kg   SpO2 97%   BMI 27.07 kg/m   Physical Exam Vitals and nursing note reviewed.  Constitutional:      General: He is not in acute distress.    Appearance: Normal appearance. He is well-developed and well-nourished. He is not ill-appearing or toxic-appearing.  HENT:     Head: Normocephalic and atraumatic.  Eyes:     Extraocular Movements: Extraocular movements intact.     Conjunctiva/sclera: Conjunctivae normal.     Pupils: Pupils are equal, round, and reactive to light.  Cardiovascular:     Rate and Rhythm: Normal rate and regular rhythm.     Heart sounds: No murmur heard.   Pulmonary:     Effort: Pulmonary effort is normal. No respiratory distress.     Breath sounds: Normal breath sounds.  Abdominal:     Palpations: Abdomen is soft.     Tenderness: There is no abdominal tenderness.  Musculoskeletal:        General: No swelling or edema. Normal range of motion.     Cervical back: Normal range of motion and neck supple.  Skin:    General: Skin is warm and dry.     Capillary Refill: Capillary refill takes less than 2 seconds.  Neurological:     General: No focal deficit present.     Mental Status: He is alert and oriented to person, place, and time.     Sensory: No sensory deficit.  Psychiatric:        Mood and Affect: Mood and affect normal.     ED Results / Procedures / Treatments   Labs (all labs ordered are listed, but only abnormal results are displayed) Labs Reviewed  RESP PANEL BY RT-PCR (FLU A&B, COVID) [id number]    EKG None  Radiology No results found.  Procedures Procedures (including critical care time)  Medications Ordered in ED Medications - No data to display  ED Course  I have reviewed the triage vital signs and the nursing notes.  Pertinent labs & imaging results that were available during my care of the patient were reviewed by me and  considered in my medical decision making (see  chart for details).    MDM Rules/Calculators/A&P                          Covid and flu testing negative.  Patient very nontoxic no acute distress.  Symptoms seem to be more consistent with an upper respiratory infection.  No fevers here.  Would recommend treating with Mucinex DM since he has a history of hypertension.  Patient given prescription but also available over-the-counter.  Patient given precautions.  Patient will recheck his blood pressure when he gets home he checks it daily.  Was elevated here.  Recommended just Tylenol as needed for the low-grade fevers.  No fever here today    Final Clinical Impression(s) / ED Diagnoses Final diagnoses:  Viral URI with cough    Rx / DC Orders ED Discharge Orders         Ordered    dextromethorphan-guaiFENesin [facility] DM) 30-600 MG 12hr tablet  2 times daily        08/05/20 0932           Fredia Sorrow, MD 08/05/20 0945

## 2020-08-06 ENCOUNTER — Other Ambulatory Visit: Payer: Self-pay

## 2020-08-06 ENCOUNTER — Emergency Department (HOSPITAL_COMMUNITY)
Admission: EM | Admit: 2020-08-06 | Discharge: 2020-08-06 | Disposition: A | Discharge status: Home / Self Care | Payer: Medicare PPO | Attending: Emergency Medicine | Admitting: Emergency Medicine

## 2020-08-06 ENCOUNTER — Emergency Department (HOSPITAL_COMMUNITY): Payer: Medicare PPO

## 2020-08-06 ENCOUNTER — Encounter (HOSPITAL_COMMUNITY): Payer: Self-pay | Admitting: Emergency Medicine

## 2020-08-06 DIAGNOSIS — Z87891 Personal history of nicotine dependence: Secondary | ICD-10-CM | POA: Insufficient documentation

## 2020-08-06 DIAGNOSIS — R0789 Other chest pain: Secondary | ICD-10-CM | POA: Diagnosis not present

## 2020-08-06 DIAGNOSIS — Z86010 Personal history of colonic polyps: Secondary | ICD-10-CM | POA: Insufficient documentation

## 2020-08-06 DIAGNOSIS — Z7984 Long term (current) use of oral hypoglycemic drugs: Secondary | ICD-10-CM | POA: Diagnosis not present

## 2020-08-06 DIAGNOSIS — Z8616 Personal history of COVID-19: Secondary | ICD-10-CM | POA: Diagnosis not present

## 2020-08-06 DIAGNOSIS — E1169 Type 2 diabetes mellitus with other specified complication: Secondary | ICD-10-CM | POA: Insufficient documentation

## 2020-08-06 DIAGNOSIS — R0602 Shortness of breath: Secondary | ICD-10-CM | POA: Diagnosis present

## 2020-08-06 DIAGNOSIS — R059 Cough, unspecified: Secondary | ICD-10-CM | POA: Diagnosis not present

## 2020-08-06 DIAGNOSIS — I25110 Atherosclerotic heart disease of native coronary artery with unstable angina pectoris: Secondary | ICD-10-CM | POA: Insufficient documentation

## 2020-08-06 DIAGNOSIS — I5041 Acute combined systolic (congestive) and diastolic (congestive) heart failure: Secondary | ICD-10-CM | POA: Diagnosis not present

## 2020-08-06 DIAGNOSIS — J069 Acute upper respiratory infection, unspecified: Secondary | ICD-10-CM | POA: Diagnosis not present

## 2020-08-06 DIAGNOSIS — Z79899 Other long term (current) drug therapy: Secondary | ICD-10-CM | POA: Diagnosis not present

## 2020-08-06 DIAGNOSIS — Z7982 Long term (current) use of aspirin: Secondary | ICD-10-CM | POA: Diagnosis not present

## 2020-08-06 DIAGNOSIS — E7800 Pure hypercholesterolemia, unspecified: Secondary | ICD-10-CM | POA: Diagnosis not present

## 2020-08-06 DIAGNOSIS — I110 Hypertensive heart disease with heart failure: Secondary | ICD-10-CM | POA: Insufficient documentation

## 2020-08-06 NOTE — Discharge Instructions (Addendum)
You can take Delsym which is just dextromethorphan for your cough.  You can take Zyrtec or Claritin over-the-counter once a day for your nasal congestion.  You should let your doctor know if you get a fever, or your symptoms last another week.

## 2020-08-06 NOTE — ED Triage Notes (Signed)
Pt c/o nasal congestion and cough. Concerned that it has turned into pneumonia.

## 2020-08-06 NOTE — ED Provider Notes (Signed)
[facility] EMERGENCY DEPARTMENT Provider Note   CSN: [id number] Arrival date & time: 08/06/20  0430   Time seen 5:09 AM  History Chief Complaint  Patient presents with  . Shortness of Breath    Ricardo Zuniga is a 69 y.o. male.  HPI   Patient states he has been feeling bad for several days.  He states he has a stuffed up nose, rare sneezing, denies anterior nasal drainage but has postnasal drip.  He states he had fever yesterday to 99.2.  He states his temperature is normally 96.4-96.7.  He denies sore throat.  He states his cough is worse at night and he was initially just coughing to clear his throat from postnasal drainage but now he feels like it is coughing because he is coughing up stuff from his lungs.  He states he is producing some mucus which is brown.  He also states he started having central chest discomfort around 4 PM today and also pain between his shoulder blades.  He states it hurts when he coughs in his back.  He describes anterior chest pain as tightness.  He states it comes and goes.  He denies nausea or vomiting or diaphoresis.  He states he has had similar symptoms before with pneumonia.  Patient was here yesterday concerned that he had Covid and had a negative Covid test.  He states he has tested himself several times this week for Covid at home.  Quit smoking years ago.  Patient states he had a positive Covid test in January and did not have symptoms.  It was done at [facility] drug.  He states he has taken the [employer] vaccine including booster.  PCP Glenda Chroman, MD   Past Medical History:  Diagnosis Date  . Anal fissure   . APML (acute promyelocytic leukemia) in remission ([place])    completed treatment 04/2013  . CHF (congestive heart failure) ([place])   . Coronary artery disease    a. 50-60% RCA stenosis by cath in 2015 b. cath in 08/2018 showing 40% RCA stenosis with no obstructive CAD.   Marland Kitchen Difficult intubation   . DM type 2 (diabetes mellitus, type 2)  (HCC)    Type 2  . Gastroesophageal reflux disease   . Heart murmur   . History of kidney stones   . Hypercholesteremia   . Myocardial infarction [facility])    " silent"  . Neuromuscular disorder (HCC)    neuropathy feet  . Nonischemic cardiomyopathy ([place])    a. EF 20% in 2015 - thought to possibly be viral induced or chemotherapy induced from treatments in 2014 b. at 35-40% by echo in 05/2016   . Peptic ulcer disease    remote  . Pneumonia   . Ureteral colic   . Wears glasses   . Wears hearing aid in both ears     Patient Active Problem List   Diagnosis Date Noted  . IBS (irritable bowel syndrome) 06/21/2020  . History of colonic polyps 06/21/2020  . Abnormal stress test   . DOE (dyspnea on exertion)   . Cervical spondylosis with myelopathy 01/05/2018  . Transient hypotension 05/17/2016  . GERD (gastroesophageal reflux disease) 05/17/2016  . Chest pain 05/16/2016  . Normal coronary arteries- cath 12/13/13 12/14/2013  . Dyslipidemia 12/14/2013  . Unstable angina ([place]) 12/11/2013  . Intermediate coronary syndrome ([place]) 12/11/2013  . Acute combined systolic and diastolic heart failure ([place]) 12/10/2013  . DM type 2 (diabetes mellitus, type 2) ([place]) 12/10/2013  .  APML (acute promyelocytic leukemia) in remission ([place]) 12/10/2013  . Congestive dilated cardiomyopathy- EF 20% at cath 12/13/13 08/10/2012  . Abnormal echocardiogram 07/01/2012  . Aortic insufficiency 07/01/2012  . Hyperlipidemia 07/01/2012  . PERS HX TOBACCO USE PRESENTING HAZARDS HEALTH 01/14/2010  . Essential hypertension 01/13/2010  . CHEST PAIN 01/13/2010    Past Surgical History:  Procedure Laterality Date  . ANAL FISSURE REPAIR    . APPENDECTOMY    . CARPAL TUNNEL RELEASE Left 10/16/2017   Procedure: LEFT CARPAL TUNNEL RELEASE;  Surgeon: Earlie Server, MD;  Location: [place];  Service: Orthopedics;  Laterality: Left;  . CARPAL TUNNEL RELEASE Right 11/25/2019   Procedure: CARPAL TUNNEL RELEASE;  Surgeon: Earlie Server, MD;  [facility];  Service: Orthopedics;  Laterality: Right;  . CATARACT EXTRACTION, BILATERAL    . CERVICAL DISC ARTHROPLASTY N/A 01/05/2018   Procedure: Artificial Cervical Disc ReplacementCervical Three-Four, Cervical Four-Five;  Surgeon: Kristeen Miss, MD;  Location: [place];  Service: Neurosurgery;  Laterality: N/A;  . CERVICAL FUSION    . CHOLECYSTECTOMY    . COLONOSCOPY N/A 06/27/2015   Procedure: COLONOSCOPY;  Surgeon: Rogene Houston, MD;  [facility];  Service: Endoscopy;  Laterality: N/A;  730  . KNEE SURGERY     x3  . LEFT AND RIGHT HEART CATHETERIZATION WITH CORONARY ANGIOGRAM N/A 12/12/2013   Procedure: LEFT AND RIGHT HEART CATHETERIZATION WITH CORONARY ANGIOGRAM;  Surgeon: [name], MD;  Location: [facility] CATH LAB;  Service: Cardiovascular;  Laterality: N/A;  . LEFT HEART CATH AND CORONARY ANGIOGRAPHY N/A 09/17/2018   Procedure: LEFT HEART CATH AND CORONARY ANGIOGRAPHY;  Surgeon: Wellington Hampshire, MD;  Location: [place] CV LAB;  Service: Cardiovascular;  Laterality: N/A;  . LUMBAR FUSION    . SHOULDER ACROMIOPLASTY Right 07/18/2015   Procedure: SHOULDER ACROMIOPLASTY;  Surgeon: Earlie Server, MD;  Location: [place];  Service: Orthopedics;  Laterality: Right;  . SHOULDER ARTHROSCOPY Right 07/18/2015   Procedure: Right shoulder arthroscopy with debridement;  Surgeon: Earlie Server, MD;  Location: [place];  Service: Orthopedics;  Laterality: Right;  . SHOULDER SURGERY     x3  . TONSILLECTOMY         Family History  Problem Relation Age of Onset  . Colon cancer Other     Social History   Tobacco Use  . Smoking status: Former Smoker    Packs/day: 2.00    Years: 17.00    Pack years: 34.00    Types: Cigarettes    Start date: 02/17/1968    Quit date: 02/28/1986    Years since quitting: 34.4  . Smokeless tobacco: Never Used  Vaping Use  . Vaping Use: Never used  Substance Use Topics  . Alcohol use: No     Alcohol/week: 0.0 standard drinks  . Drug use: No    Home Medications Prior to Admission medications   Medication Sig Start Date End Date Taking? Authorizing Provider  acetaminophen (TYLENOL) 650 MG CR tablet Take 1,300 mg by mouth every 8 (eight) hours as needed for pain.    [provider]  aspirin EC 81 MG tablet Take 81 mg by mouth daily.    [provider]  atorvastatin (LIPITOR) 80 MG tablet Take 40 mg by mouth every evening.  04/29/16   [provider]  cetirizine (ZYRTEC) 10 MG tablet Take 10 mg by mouth daily as needed for allergies.    [provider]  dextromethorphan-guaiFENesin ([place] DM) 30-600 MG  12hr tablet Take 1 tablet by mouth 2 (two) times daily. 08/05/20   Fredia Sorrow, MD  diazepam (VALIUM) 2 MG tablet Take 1 tablet (2 mg total) by mouth every 8 (eight) hours as needed for anxiety or muscle spasms. 11/25/19 11/24/20  Chadwell, Vonna Kotyk, PA-C  docusate sodium (COLACE) 100 MG capsule Take 200 mg by mouth at bedtime.    [provider]  ENTRESTO 24-26 MG Take 1 tablet by mouth twice daily 11/28/19   Herminio Commons, MD  furosemide (LASIX) 20 MG tablet TAKE 1 TABLET BY MOUTH ONCE DAILY AS DIRECTED 04/09/20   Verta Ellen., NP  INVOKANA 300 MG TABS Take 300 mg by mouth daily.  12/05/13   [provider]  metoprolol succinate (TOPROL XL) 25 MG 24 hr tablet Take 0.5 tablets (12.5 mg total) by mouth daily. 03/12/20   Verta Ellen., NP  omeprazole (PRILOSEC) 20 MG capsule Take 20 mg by mouth daily before breakfast.    [provider]  repaglinide (PRANDIN) 2 MG tablet Take 2 mg by mouth 3 (three) times daily before meals.  07/06/17   [provider]  traMADol (ULTRAM) 50 MG tablet Take 1 tablet (50 mg total) by mouth every 6 (six) hours as needed. 11/25/19 11/24/20  Chadwell, Vonna Kotyk, PA-C    Allergies    Lisinopril  Review of Systems   Review of Systems  All other systems reviewed and are  negative.   Physical Exam Updated Vital Signs BP 130/70   Pulse 81   Temp 98.3 F (36.8 C) (Oral)   Resp 16   Ht 5\' 9"  (1.753 m)   Wt 84.4 kg   SpO2 96%   BMI 27.47 kg/m   Physical Exam Vitals and nursing note reviewed.  Constitutional:      General: He is not in acute distress.    Appearance: Normal appearance. He is normal weight. He is not ill-appearing or toxic-appearing.  HENT:     Head: Normocephalic and atraumatic.     Right Ear: External ear normal.     Left Ear: External ear normal.     Nose: Nose normal.     Mouth/Throat:     Mouth: Mucous membranes are moist.  Eyes:     Extraocular Movements: Extraocular movements intact.     Conjunctiva/sclera: Conjunctivae normal.     Pupils: Pupils are equal, round, and reactive to light.  Cardiovascular:     Rate and Rhythm: Normal rate and regular rhythm.     Pulses: Normal pulses.     Heart sounds: Normal heart sounds.  Pulmonary:     Effort: Pulmonary effort is normal. No respiratory distress.     Breath sounds: Normal breath sounds. No stridor. No wheezing, rhonchi or rales.  Chest:     Chest wall: No tenderness.  Musculoskeletal:     Cervical back: Normal range of motion.  Skin:    General: Skin is warm and dry.  Neurological:     General: No focal deficit present.     Mental Status: He is alert and oriented to person, place, and time.     Cranial Nerves: No cranial nerve deficit.     Sensory: Sensory deficit:   Psychiatric:        Mood and Affect: Mood is anxious.        Speech: Speech is rapid and pressured.        Behavior: Behavior is hyperactive.     ED Results / Procedures /  Treatments   Labs (all labs ordered are listed, but only abnormal results are displayed) Labs Reviewed - No data to display  EKG EKG Interpretation  Date/Time:  Monday August 06 2020 05:31:45 EST Ventricular Rate:  92 PR Interval:    QRS Duration: 94 QT Interval:  374 QTC Calculation: 463 R Axis:   58 Text  Interpretation: Sinus rhythm Nonspecific T abnormalities, lateral leads No significant change since last tracing 17 Nov 2019 Confirmed by Rolland Porter [phone]) on 08/06/2020 5:35:53 AM   Radiology DG Chest 2 View  Result Date: 08/06/2020 CLINICAL DATA:  Cough and congestion. EXAM: CHEST - 2 VIEW COMPARISON:  01/13/2019 FINDINGS: The cardiac silhouette, mediastinal and hilar contours are within normal limits and stable. The lungs are clear of an acute process. No infiltrates, edema or effusions. No pulmonary lesions. The bony thorax is intact. IMPRESSION: No acute cardiopulmonary findings. Electronically Signed   By: Marijo Sanes M.D.   On: 08/06/2020 05:52    Procedures Procedures (including critical care time)  Medications Ordered in ED Medications - No data to display  ED Course  I have reviewed the triage vital signs and the nursing notes.  Pertinent labs & imaging results that were available during my care of the patient were reviewed by me and considered in my medical decision making (see chart for details).    MDM Rules/Calculators/A&P                         Patient's pulse ox was 97% with ambulation.  Patient is seems very anxious.  He will hardly let me answer question because he keeps talking.  He keeps profusely apologizing for coming in.  He does not appear to be in any respiratory distress.  Chest x-ray EKG was done.  Patient quit smoking many years ago, presents with URI symptoms.  Seems to have lots of anxiety about his symptoms.  He has a way to monitor his oxygen at home as well as his blood pressure and pulse and temperature.  I explained to him this is viral in antibiotics or not indicated at this time.   Final Clinical Impression(s) / ED Diagnoses Final diagnoses:  Upper respiratory tract infection, unspecified type    Rx / DC Orders ED Discharge Orders    None     Plan discharge  Rolland Porter, MD, Barbette Or, MD 08/06/20 [phone]

## 2020-08-06 NOTE — ED Notes (Signed)
Patient transported to X-ray 

## 2020-08-06 NOTE — ED Notes (Signed)
Pt ambulated with Pulse Ox. Pt maintained 97% O2 Sats on room air.

## 2020-08-06 NOTE — ED Notes (Signed)
Pt returned from X Ray.

## 2020-08-14 ENCOUNTER — Telehealth (INDEPENDENT_AMBULATORY_CARE_PROVIDER_SITE_OTHER): Payer: Self-pay | Admitting: Gastroenterology

## 2020-08-14 DIAGNOSIS — J320 Chronic maxillary sinusitis: Secondary | ICD-10-CM | POA: Diagnosis not present

## 2020-08-14 DIAGNOSIS — I270 Primary pulmonary hypertension: Secondary | ICD-10-CM | POA: Diagnosis not present

## 2020-08-14 DIAGNOSIS — I509 Heart failure, unspecified: Secondary | ICD-10-CM | POA: Diagnosis not present

## 2020-08-14 DIAGNOSIS — Z2821 Immunization not carried out because of patient refusal: Secondary | ICD-10-CM | POA: Diagnosis not present

## 2020-08-14 DIAGNOSIS — I10 Essential (primary) hypertension: Secondary | ICD-10-CM | POA: Diagnosis not present

## 2020-08-14 DIAGNOSIS — Z299 Encounter for prophylactic measures, unspecified: Secondary | ICD-10-CM | POA: Diagnosis not present

## 2020-08-14 DIAGNOSIS — I429 Cardiomyopathy, unspecified: Secondary | ICD-10-CM | POA: Diagnosis not present

## 2020-08-14 NOTE — Telephone Encounter (Signed)
Patient called stated he is to have a colonoscopy on Friday - states he has a sinus infection and is on several antibiotics - states he doesn't know if this will effect his procedure - please advise - ph# [phone]

## 2020-08-14 NOTE — Telephone Encounter (Signed)
Noted thank you, I have spoken to him

## 2020-08-15 ENCOUNTER — Other Ambulatory Visit: Payer: Self-pay

## 2020-08-15 ENCOUNTER — Other Ambulatory Visit (HOSPITAL_COMMUNITY)
Admission: RE | Admit: 2020-08-15 | Discharge: 2020-08-15 | Disposition: A | Discharge status: Home / Self Care | Payer: Medicare PPO | Source: Ambulatory Visit | Attending: Gastroenterology | Admitting: Gastroenterology

## 2020-08-15 ENCOUNTER — Encounter (HOSPITAL_COMMUNITY)
Admission: RE | Admit: 2020-08-15 | Discharge: 2020-08-15 | Disposition: A | Discharge status: Home / Self Care | Payer: Medicare PPO | Source: Ambulatory Visit | Attending: Gastroenterology | Admitting: Gastroenterology

## 2020-08-15 ENCOUNTER — Encounter (HOSPITAL_COMMUNITY): Payer: Self-pay

## 2020-08-15 DIAGNOSIS — Z01818 Encounter for other preprocedural examination: Secondary | ICD-10-CM | POA: Diagnosis not present

## 2020-08-15 DIAGNOSIS — Z20822 Contact with and (suspected) exposure to covid-19: Secondary | ICD-10-CM | POA: Insufficient documentation

## 2020-08-15 HISTORY — DX: Polyneuropathy, unspecified: G62.9

## 2020-08-15 LAB — BASIC METABOLIC PANEL
Anion gap: 14 (ref 5–15)
BUN: 18 mg/dL (ref 8–23)
CO2: 21 mmol/L — ABNORMAL LOW (ref 22–32)
Calcium: 9.5 mg/dL (ref 8.9–10.3)
Chloride: 100 mmol/L (ref 98–111)
Creatinine, Ser: 0.99 mg/dL (ref 0.61–1.24)
GFR, Estimated: >60 mL/min (ref >60)
Glucose, Bld: 479 mg/dL — ABNORMAL HIGH (ref 70–99)
Potassium: 4.3 mmol/L (ref 3.5–5.1)
Sodium: 135 mmol/L (ref 135–145)

## 2020-08-15 NOTE — Patient Instructions (Signed)
ELMA LIMAS  08/15/2020     @PREFPERIOPPHARMACY @   Your procedure is scheduled on  17/2022.  Report to [facility] at  1000  A.M.  Call this number if you have problems the morning of surgery:  [phone]   Remember:  Follow the diet and prep instructions given to you by the office.                       Take these medicines the morning of surgery with A SIP OF WATER  Valium (if needed), entresto, metoprolol, prilosec, tramadol(if needed).    Do not wear jewelry, make-up or nail polish.  Do not wear lotions, powders, or perfumes. Please wear deodorant and brush your teeth.  Do not shave 48 hours prior to surgery.  Men may shave face and neck.  Do not bring valuables to the hospital.  [facility] is not responsible for any belongings or valuables.  Contacts, dentures or bridgework may not be worn into surgery.  Leave your suitcase in the car.  After surgery it may be brought to your room.  For patients admitted to the hospital, discharge time will be determined by your treatment team.  Patients discharged the day of surgery will not be allowed to drive home.   Name and phone number of your driver:   family Special instructions:  DO NOT smoke the morning of your procedure.  Please read over the following fact sheets that you were given. Anesthesia Post-op Instructions and Care and Recovery After Surgery       Upper Endoscopy, Adult, Care After This sheet gives you information about how to care for yourself after your procedure. Your health care provider may also give you more specific instructions. If you have problems or questions, contact your health care provider. What can I expect after the procedure? After the procedure, it is common to have:  A sore throat.  Mild stomach pain or discomfort.  Bloating.  Nausea. Follow these instructions at home:   Follow instructions from your health care provider about what to eat or drink after your  procedure.  Return to your normal activities as told by your health care provider. Ask your health care provider what activities are safe for you.  Take over-the-counter and prescription medicines only as told by your health care provider.  Do not drive for 24 hours if you were given a sedative during your procedure.  Keep all follow-up visits as told by your health care provider. This is important. Contact a health care provider if you have:  A sore throat that lasts longer than one day.  Trouble swallowing. Get help right away if:  You vomit blood or your vomit looks like coffee grounds.  You have: ? A fever. ? Bloody, black, or tarry stools. ? A severe sore throat or you cannot swallow. ? Difficulty breathing. ? Severe pain in your chest or abdomen. Summary  After the procedure, it is common to have a sore throat, mild stomach discomfort, bloating, and nausea.  Do not drive for 24 hours if you were given a sedative during the procedure.  Follow instructions from your health care provider about what to eat or drink after your procedure.  Return to your normal activities as told by your health care provider. This information is not intended to replace advice given to you by your health care provider. Make sure you discuss any questions you have with  your health care provider. Document Revised: 01/19/2018 Document Reviewed: 12/28/2017 Elsevier Patient Education  [place].  Colonoscopy, Adult, Care After This sheet gives you information about how to care for yourself after your procedure. Your health care provider may also give you more specific instructions. If you have problems or questions, contact your health care provider. What can I expect after the procedure? After the procedure, it is common to have:  A small amount of blood in your stool for 24 hours after the procedure.  Some gas.  Mild cramping or bloating of your abdomen. Follow these instructions  at home: Eating and drinking   Drink enough fluid to keep your urine pale yellow.  Follow instructions from your health care provider about eating or drinking restrictions.  Resume your normal diet as instructed by your health care provider. Avoid heavy or fried foods that are hard to digest. Activity  Rest as told by your health care provider.  Avoid sitting for a long time without moving. Get up to take short walks every 1-2 hours. This is important to improve blood flow and breathing. Ask for help if you feel weak or unsteady.  Return to your normal activities as told by your health care provider. Ask your health care provider what activities are safe for you. Managing cramping and bloating   Try walking around when you have cramps or feel bloated.  Apply heat to your abdomen as told by your health care provider. Use the heat source that your health care provider recommends, such as a moist heat pack or a heating pad. ? Place a towel between your skin and the heat source. ? Leave the heat on for 20-30 minutes. ? Remove the heat if your skin turns bright red. This is especially important if you are unable to feel pain, heat, or cold. You may have a greater risk of getting burned. General instructions  For the first 24 hours after the procedure: ? Do not drive or use machinery. ? Do not sign important documents. ? Do not drink alcohol. ? Do your regular daily activities at a slower pace than normal. ? Eat soft foods that are easy to digest.  Take over-the-counter and prescription medicines only as told by your health care provider.  Keep all follow-up visits as told by your health care provider. This is important. Contact a health care provider if:  You have blood in your stool 2-3 days after the procedure. Get help right away if you have:  More than a small spotting of blood in your stool.  Large blood clots in your stool.  Swelling of your abdomen.  Nausea or  vomiting.  A fever.  Increasing pain in your abdomen that is not relieved with medicine. Summary  After the procedure, it is common to have a small amount of blood in your stool. You may also have mild cramping and bloating of your abdomen.  For the first 24 hours after the procedure, do not drive or use machinery, sign important documents, or drink alcohol.  Get help right away if you have a lot of blood in your stool, nausea or vomiting, a fever, or increased pain in your abdomen. This information is not intended to replace advice given to you by your health care provider. Make sure you discuss any questions you have with your health care provider. Document Revised: 02/21/2019 Document Reviewed: 02/21/2019 Elsevier Patient Education  [place] After These instructions provide you with  information about caring for yourself after your procedure. Your health care provider may also give you more specific instructions. Your treatment has been planned according to current medical practices, but problems sometimes occur. Call your health care provider if you have any problems or questions after your procedure. What can I expect after the procedure? After your procedure, you may:  Feel sleepy for several hours.  Feel clumsy and have poor balance for several hours.  Feel forgetful about what happened after the procedure.  Have poor judgment for several hours.  Feel nauseous or vomit.  Have a sore throat if you had a breathing tube during the procedure. Follow these instructions at home: For at least 24 hours after the procedure:      Have a responsible adult stay with you. It is important to have someone help care for you until you are awake and alert.  Rest as needed.  Do not: ? Participate in activities in which you could fall or become injured. ? Drive. ? Use heavy machinery. ? Drink alcohol. ? Take sleeping pills or medicines that  cause drowsiness. ? Make important decisions or sign legal documents. ? Take care of children on your own. Eating and drinking  Follow the diet that is recommended by your health care provider.  If you vomit, drink water, juice, or soup when you can drink without vomiting.  Make sure you have little or no nausea before eating solid foods. General instructions  Take over-the-counter and prescription medicines only as told by your health care provider.  If you have sleep apnea, surgery and certain medicines can increase your risk for breathing problems. Follow instructions from your health care provider about wearing your sleep device: ? Anytime you are sleeping, including during daytime naps. ? While taking prescription pain medicines, sleeping medicines, or medicines that make you drowsy.  If you smoke, do not smoke without supervision.  Keep all follow-up visits as told by your health care provider. This is important. Contact a health care provider if:  You keep feeling nauseous or you keep vomiting.  You feel light-headed.  You develop a rash.  You have a fever. Get help right away if:  You have trouble breathing. Summary  For several hours after your procedure, you may feel sleepy and have poor judgment.  Have a responsible adult stay with you for at least 24 hours or until you are awake and alert. This information is not intended to replace advice given to you by your health care provider. Make sure you discuss any questions you have with your health care provider. Document Revised: 10/26/2017 Document Reviewed: 11/18/2015 Elsevier Patient Education  [place].

## 2020-08-16 LAB — SARS CORONAVIRUS 2 (TAT 6-24 HRS): SARS Coronavirus 2: NEGATIVE

## 2020-08-16 NOTE — OR Nursing (Signed)
Dr. Johnnette Litter notified of blood sugar 479. Called office per Dr. Johnnette Litter request. Left message on Soledad Gerlach Lo[place]ace voicemail in regards to blood sugar. And that Dr. Johnnette Litter said if his blood sugars are above 350  He will cancel his procedure.

## 2020-08-17 ENCOUNTER — Ambulatory Visit (HOSPITAL_COMMUNITY)
Admission: RE | Admit: 2020-08-17 | Discharge: 2020-08-17 | Disposition: A | Discharge status: Home / Self Care | Payer: Medicare PPO | Attending: Gastroenterology | Admitting: Gastroenterology

## 2020-08-17 ENCOUNTER — Ambulatory Visit (HOSPITAL_COMMUNITY): Payer: Medicare PPO | Admitting: Anesthesiology

## 2020-08-17 ENCOUNTER — Encounter (HOSPITAL_COMMUNITY)
Admission: RE | Disposition: A | Discharge status: Home / Self Care | Payer: Self-pay | Source: Home / Self Care | Attending: Gastroenterology

## 2020-08-17 ENCOUNTER — Encounter (HOSPITAL_COMMUNITY): Payer: Self-pay | Admitting: Gastroenterology

## 2020-08-17 DIAGNOSIS — Z87891 Personal history of nicotine dependence: Secondary | ICD-10-CM | POA: Insufficient documentation

## 2020-08-17 DIAGNOSIS — E1142 Type 2 diabetes mellitus with diabetic polyneuropathy: Secondary | ICD-10-CM | POA: Diagnosis not present

## 2020-08-17 DIAGNOSIS — K621 Rectal polyp: Secondary | ICD-10-CM | POA: Diagnosis not present

## 2020-08-17 DIAGNOSIS — I428 Other cardiomyopathies: Secondary | ICD-10-CM | POA: Diagnosis not present

## 2020-08-17 DIAGNOSIS — I10 Essential (primary) hypertension: Secondary | ICD-10-CM | POA: Diagnosis not present

## 2020-08-17 DIAGNOSIS — Z800 Family history of malignant neoplasm of digestive organs: Secondary | ICD-10-CM | POA: Diagnosis not present

## 2020-08-17 DIAGNOSIS — Z7984 Long term (current) use of oral hypoglycemic drugs: Secondary | ICD-10-CM | POA: Insufficient documentation

## 2020-08-17 DIAGNOSIS — R109 Unspecified abdominal pain: Secondary | ICD-10-CM | POA: Diagnosis not present

## 2020-08-17 DIAGNOSIS — K3189 Other diseases of stomach and duodenum: Secondary | ICD-10-CM | POA: Insufficient documentation

## 2020-08-17 DIAGNOSIS — E119 Type 2 diabetes mellitus without complications: Secondary | ICD-10-CM | POA: Diagnosis not present

## 2020-08-17 DIAGNOSIS — I509 Heart failure, unspecified: Secondary | ICD-10-CM | POA: Diagnosis not present

## 2020-08-17 DIAGNOSIS — Z79899 Other long term (current) drug therapy: Secondary | ICD-10-CM | POA: Insufficient documentation

## 2020-08-17 DIAGNOSIS — Z888 Allergy status to other drugs, medicaments and biological substances status: Secondary | ICD-10-CM | POA: Insufficient documentation

## 2020-08-17 DIAGNOSIS — Z87442 Personal history of urinary calculi: Secondary | ICD-10-CM | POA: Insufficient documentation

## 2020-08-17 DIAGNOSIS — K635 Polyp of colon: Secondary | ICD-10-CM | POA: Diagnosis not present

## 2020-08-17 DIAGNOSIS — K219 Gastro-esophageal reflux disease without esophagitis: Secondary | ICD-10-CM | POA: Insufficient documentation

## 2020-08-17 DIAGNOSIS — I110 Hypertensive heart disease with heart failure: Secondary | ICD-10-CM | POA: Insufficient documentation

## 2020-08-17 DIAGNOSIS — D128 Benign neoplasm of rectum: Secondary | ICD-10-CM | POA: Diagnosis not present

## 2020-08-17 DIAGNOSIS — I2510 Atherosclerotic heart disease of native coronary artery without angina pectoris: Secondary | ICD-10-CM | POA: Diagnosis not present

## 2020-08-17 DIAGNOSIS — I252 Old myocardial infarction: Secondary | ICD-10-CM | POA: Insufficient documentation

## 2020-08-17 DIAGNOSIS — D124 Benign neoplasm of descending colon: Secondary | ICD-10-CM | POA: Insufficient documentation

## 2020-08-17 DIAGNOSIS — D122 Benign neoplasm of ascending colon: Secondary | ICD-10-CM | POA: Insufficient documentation

## 2020-08-17 DIAGNOSIS — Z7982 Long term (current) use of aspirin: Secondary | ICD-10-CM | POA: Insufficient documentation

## 2020-08-17 DIAGNOSIS — R1030 Lower abdominal pain, unspecified: Secondary | ICD-10-CM | POA: Diagnosis not present

## 2020-08-17 DIAGNOSIS — Z1211 Encounter for screening for malignant neoplasm of colon: Secondary | ICD-10-CM | POA: Diagnosis not present

## 2020-08-17 DIAGNOSIS — C9241 Acute promyelocytic leukemia, in remission: Secondary | ICD-10-CM | POA: Insufficient documentation

## 2020-08-17 DIAGNOSIS — R079 Chest pain, unspecified: Secondary | ICD-10-CM | POA: Diagnosis not present

## 2020-08-17 DIAGNOSIS — Z86010 Personal history of colonic polyps: Secondary | ICD-10-CM | POA: Diagnosis not present

## 2020-08-17 HISTORY — PX: BIOPSY: SHX5522

## 2020-08-17 HISTORY — PX: ESOPHAGOGASTRODUODENOSCOPY (EGD) WITH PROPOFOL: SHX5813

## 2020-08-17 HISTORY — PX: POLYPECTOMY: SHX5525

## 2020-08-17 HISTORY — PX: COLONOSCOPY WITH PROPOFOL: SHX5780

## 2020-08-17 LAB — GLUCOSE, CAPILLARY
Glucose-Capillary: 147 mg/dL — ABNORMAL HIGH (ref 70–99)
Glucose-Capillary: 120 mg/dL — ABNORMAL HIGH (ref 70–99)

## 2020-08-17 LAB — HM COLONOSCOPY

## 2020-08-17 SURGERY — COLONOSCOPY WITH PROPOFOL
Anesthesia: General

## 2020-08-17 MED ORDER — LACTATED RINGERS IV SOLN: INTRAVENOUS | Status: DC | PRN

## 2020-08-17 MED ORDER — STERILE WATER FOR IRRIGATION IR SOLN
Status: DC | PRN
  Administered 2020-08-17: 200 mL

## 2020-08-17 MED ORDER — LACTATED RINGERS IV SOLN
INTRAVENOUS | Status: DC
  Administered 2020-08-17: 1000 mL via INTRAVENOUS

## 2020-08-17 MED ORDER — LIDOCAINE VISCOUS HCL 2 % MT SOLN
OROMUCOSAL | Status: AC
  Filled 2020-08-17: qty 15

## 2020-08-17 MED ORDER — GLYCOPYRROLATE 0.2 MG/ML IJ SOLN
INTRAMUSCULAR | Status: AC
  Filled 2020-08-17: qty 1

## 2020-08-17 MED ORDER — PHENYLEPHRINE HCL (PRESSORS) 10 MG/ML IV SOLN
INTRAVENOUS | Status: DC | PRN
  Administered 2020-08-17: 200 ug via INTRAVENOUS
  Administered 2020-08-17: 120 ug via INTRAVENOUS
  Administered 2020-08-17: 200 ug via INTRAVENOUS
  Administered 2020-08-17: 100 ug via INTRAVENOUS
  Administered 2020-08-17 (×3): 120 ug via INTRAVENOUS
  Administered 2020-08-17 (×4): 200 ug via INTRAVENOUS
  Administered 2020-08-17 (×2): 100 ug via INTRAVENOUS

## 2020-08-17 MED ORDER — PROPOFOL 10 MG/ML IV BOLUS
INTRAVENOUS | Status: DC | PRN
  Administered 2020-08-17: 50 mg via INTRAVENOUS

## 2020-08-17 MED ORDER — PROPOFOL 500 MG/50ML IV EMUL
INTRAVENOUS | Status: DC | PRN
  Administered 2020-08-17: 150 ug/kg/min via INTRAVENOUS

## 2020-08-17 MED ORDER — PROPOFOL 10 MG/ML IV BOLUS
INTRAVENOUS | Status: AC
  Filled 2020-08-17: qty 20

## 2020-08-17 MED ORDER — PHENYLEPHRINE HCL (PRESSORS) 10 MG/ML IV SOLN
INTRAVENOUS | Status: AC
  Filled 2020-08-17: qty 1

## 2020-08-17 MED ORDER — SODIUM CHLORIDE (PF) 0.9 % IJ SOLN
INTRAMUSCULAR | Status: AC
  Filled 2020-08-17: qty 20

## 2020-08-17 NOTE — Anesthesia Preprocedure Evaluation (Signed)
Anesthesia Evaluation  Patient identified by MRN, date of birth, ID band Patient awake    Reviewed: Allergy & Precautions, H&P , NPO status , Patient's Chart, lab work & pertinent test results, reviewed documented beta blocker date and time   History of Anesthesia Complications (+) DIFFICULT AIRWAY and history of anesthetic complications  Airway Mallampati: III  TM Distance: >3 FB Neck ROM: full    Dental no notable dental hx.    Pulmonary pneumonia, resolved, former smoker,    Pulmonary exam normal breath sounds clear to auscultation       Cardiovascular Exercise Tolerance: Good hypertension, + angina + CAD, + Past MI, +CHF and + DOE   Rhythm:regular Rate:Normal     Neuro/Psych  Neuromuscular disease negative psych ROS   GI/Hepatic Neg liver ROS, PUD, GERD  Medicated,  Endo/Other  negative endocrine ROSdiabetes, Poorly Controlled, Type 2  Renal/GU negative Renal ROS  negative genitourinary   Musculoskeletal   Abdominal   Peds  Hematology negative hematology ROS (+)   Anesthesia Other Findings   Reproductive/Obstetrics negative OB ROS                             Anesthesia Physical Anesthesia Plan  ASA: III  Anesthesia Plan: General   Post-op Pain Management:    Induction:   PONV Risk Score and Plan: Propofol infusion  Airway Management Planned:   Additional Equipment:   Intra-op Plan:   Post-operative Plan:   Informed Consent: I have reviewed the patients History and Physical, chart, labs and discussed the procedure including the risks, benefits and alternatives for the proposed anesthesia with the patient or authorized representative who has indicated his/her understanding and acceptance.     Dental Advisory Given  Plan Discussed with: CRNA  Anesthesia Plan Comments:         Anesthesia Quick Evaluation

## 2020-08-17 NOTE — Anesthesia Postprocedure Evaluation (Signed)
Anesthesia Post Note  Patient: Ricardo Zuniga  Procedure(s) Performed: COLONOSCOPY WITH PROPOFOL (N/A ) ESOPHAGOGASTRODUODENOSCOPY (EGD) WITH PROPOFOL (N/A ) BIOPSY POLYPECTOMY  Patient location during evaluation: PACU Anesthesia Type: General Level of consciousness: awake, oriented, awake and alert and patient cooperative Pain management: satisfactory to patient Vital Signs Assessment: post-procedure vital signs reviewed and stable Respiratory status: spontaneous breathing, respiratory function stable and nonlabored ventilation Cardiovascular status: stable Postop Assessment: no apparent nausea or vomiting Anesthetic complications: no   No complications documented.   Last Vitals:  Vitals:   08/17/20 0704  BP: 133/75  Pulse: 97  Resp: 16  Temp: 37.1 C  SpO2: 98%    Last Pain:  Vitals:   08/17/20 0822  TempSrc:   PainSc: 0-No pain                 Pavielle Biggar

## 2020-08-17 NOTE — Op Note (Addendum)
[facility] Patient Name: Ricardo Zuniga Procedure Date: 08/17/2020 8:15 AM MRN: [id number] Date of Birth: 04/13/1951 Attending MD: Maylon Peppers ,  CSN: [id number] Age: 70 Admit Type: Outpatient Procedure:                Upper GI endoscopy Indications:              Lower abdominal pain, Unexplained chest pain Providers:                Maylon Peppers, Lambert Mody, Wynonia Musty Tech, Technician, Kristine L. Risa Grill, Technician Referring MD:              Medicines:                Monitored Anesthesia Care Complications:            No immediate complications. Estimated Blood Loss:     Estimated blood loss: none. Procedure:                Pre-Anesthesia Assessment:                           - Prior to the procedure, a History and Physical                            was performed, and patient medications, allergies                            and sensitivities were reviewed. The patient's                            tolerance of previous anesthesia was reviewed.                           - The risks and benefits of the procedure and the                            sedation options and risks were discussed with the                            patient. All questions were answered and informed                            consent was obtained.                           - ASA Grade Assessment: III - A patient with severe                            systemic disease.                           After obtaining informed consent, the endoscope was  passed under direct vision. Throughout the                            procedure, the patient's blood pressure, pulse, and                            oxygen saturations were monitored continuously. The                            [id number]-H190 ([id number]) scope was introduced through the                            mouth, and advanced to the second part of duodenum.                             The upper GI endoscopy was accomplished without                            difficulty. The patient tolerated the procedure                            well. Scope In: 8:28:47 AM Scope Out: 8:38:15 AM Total Procedure Duration: 0 hours 9 minutes 28 seconds  Findings:      The examined esophagus was normal.      The entire examined stomach was normal.      Two 4 to 5 mm submucosal nodules were found in the second portion of the       duodenum. Biopsies were taken with a cold forceps for histology. Impression:               - Normal esophagus.                           - Normal stomach.                           - Submucosal nodule found in the duodenum. Biopsied. Moderate Sedation:      Per Anesthesia Care Recommendation:           - Discharge patient to home (ambulatory).                           - Resume previous diet.                           - Continue present medications, including Prilosec                            and Miralax.                           - Await pathology results. Procedure Code(s):        --- Professional ---                           [phone], GC, Esophagogastroduodenoscopy, flexible,  transoral; with biopsy, single or multiple Diagnosis Code(s):        --- Professional ---                           K31.89, Other diseases of stomach and duodenum                           R10.30, Lower abdominal pain, unspecified                           R07.9, Chest pain, unspecified CPT copyright 2019 American Medical Association. All rights reserved. The codes documented in this report are preliminary and upon coder review may  be revised to meet current compliance requirements. Maylon Peppers, MD Maylon Peppers,  08/17/2020 8:42:46 AM This report has been signed electronically. Number of Addenda: 0

## 2020-08-17 NOTE — Op Note (Signed)
[facility] Patient Name: Ricardo Zuniga Procedure Date: 08/17/2020 8:40 AM MRN: [id number] Date of Birth: 05/26/1951 Attending MD: Maylon Peppers ,  CSN: [id number] Age: 70 Admit Type: Outpatient Procedure:                Colonoscopy Indications:              Screening for colorectal malignant neoplasm Providers:                Maylon Peppers, Lambert Mody, Casimer Bilis, Technician, Kristine L. Risa Grill, Technician Referring MD:              Medicines:                Monitored Anesthesia Care Complications:            No immediate complications. Estimated Blood Loss:     Estimated blood loss: none. Procedure:                Pre-Anesthesia Assessment:                           - Prior to the procedure, a History and Physical                            was performed, and patient medications, allergies                            and sensitivities were reviewed. The patient's                            tolerance of previous anesthesia was reviewed.                           - The risks and benefits of the procedure and the                            sedation options and risks were discussed with the                            patient. All questions were answered and informed                            consent was obtained.                           - ASA Grade Assessment: III - A patient with severe                            systemic disease.                           After obtaining informed consent, the colonoscope  was passed under direct vision. Throughout the                            procedure, the patient's blood pressure, pulse, and                            oxygen saturations were monitored continuously. The                            [id number]-[id number] ([id number] scope was introduced through                            the anus and advanced to the the cecum, identified                             by appendiceal orifice and ileocecal valve. The                            colonoscopy was performed without difficulty. The                            patient tolerated the procedure well. The quality                            of the bowel preparation was excellent. Scope                            withdrawal time was 14 minutes. Scope In: 8:44:36 AM Scope Out: 9:45:46 AM Scope Withdrawal Time: 0 hours 50 minutes 52 seconds  Total Procedure Duration: 1 hour 1 minute 10 seconds  Findings:      The perianal and digital rectal examinations were normal.      Two sessile polyps were found in the ascending colon. The polyps were 4       to 5 mm in size. These polyps were removed with a cold snare. Resection       and retrieval were complete.      Five sessile polyps were found in the descending colon. The polyps were       2 to 8 mm in size. These polyps were removed with a cold snare.       Resection and retrieval were complete.      A 15 mm polyp was found in the descending colon. The polyp was sessile.       The polyp was removed with a piecemeal technique using a hot snare.       Resection and retrieval were complete. The borders of the lesion were       ablated with coagulation with the tip of th snare.      A 8 mm polyp was found in the rectum. The polyp was sessile. The polyp       was removed with a cold snare. Resection and retrieval were complete. Impression:               - Two 4 to 5 mm polyps in the ascending colon,  removed with a cold snare. Resected and retrieved.                           - Five 2 to 8 mm polyps in the descending colon,                            removed with a cold snare. Resected and retrieved.                           - One 15 mm polyp in the descending colon, removed                            piecemeal using a hot snare. Resected and retrieved.                           - One 8 mm polyp in the rectum, removed  with a cold                            snare. Resected and retrieved. Moderate Sedation:      Per Anesthesia Care Recommendation:           - Discharge patient to home (ambulatory).                           - Resume previous diet.                           - Await pathology results.                           - Repeat colonoscopy in 1 year for surveillance. Procedure Code(s):        --- Professional ---                           [phone], GC, Colonoscopy, flexible; with removal of                            tumor(s), polyp(s), or other lesion(s) by snare                            technique Diagnosis Code(s):        --- Professional ---                           Z12.11, Encounter for screening for malignant                            neoplasm of colon                           K63.5, Polyp of colon                           K62.1, Rectal polyp CPT copyright 2019 American Medical Association. All rights reserved. The codes documented in this report are preliminary and  upon coder review may  be revised to meet current compliance requirements. Maylon Peppers, MD Maylon Peppers,  08/17/2020 9:52:41 AM This report has been signed electronically. Number of Addenda: 0

## 2020-08-17 NOTE — Discharge Instructions (Signed)
You are being discharged to home.  Resume your previous diet.  Continue your present medications, including Prilosec and Miralax.  We are waiting for your pathology results.  Your physician has recommended a repeat colonoscopy in one year for surveillance.    Colonoscopy, Adult, Care After This sheet gives you information about how to care for yourself after your procedure. Your health care provider may also give you more specific instructions. If you have problems or questions, contact your health care provider. What can I expect after the procedure? After the procedure, it is common to have:  A small amount of blood in your stool for 24 hours after the procedure.  Some gas.  Mild cramping or bloating of your abdomen. Follow these instructions at home: Eating and drinking   Drink enough fluid to keep your urine pale yellow.  Follow instructions from your health care provider about eating or drinking restrictions.  Resume your normal diet as instructed by your health care provider. Avoid heavy or fried foods that are hard to digest. Activity  Rest as told by your health care provider.  Avoid sitting for a long time without moving. Get up to take short walks every 1-2 hours. This is important to improve blood flow and breathing. Ask for help if you feel weak or unsteady.  Return to your normal activities as told by your health care provider. Ask your health care provider what activities are safe for you. Managing cramping and bloating   Try walking around when you have cramps or feel bloated.  Apply heat to your abdomen as told by your health care provider. Use the heat source that your health care provider recommends, such as a moist heat pack or a heating pad. ? Place a towel between your skin and the heat source. ? Leave the heat on for 20-30 minutes. ? Remove the heat if your skin turns bright red. This is especially important if you are unable to feel pain, heat, or cold.  You may have a greater risk of getting burned. General instructions  For the first 24 hours after the procedure: ? Do not drive or use machinery. ? Do not sign important documents. ? Do not drink alcohol. ? Do your regular daily activities at a slower pace than normal. ? Eat soft foods that are easy to digest.  Take over-the-counter and prescription medicines only as told by your health care provider.  Keep all follow-up visits as told by your health care provider. This is important. Contact a health care provider if:  You have blood in your stool 2-3 days after the procedure. Get help right away if you have:  More than a small spotting of blood in your stool.  Large blood clots in your stool.  Swelling of your abdomen.  Nausea or vomiting.  A fever.  Increasing pain in your abdomen that is not relieved with medicine. Summary  After the procedure, it is common to have a small amount of blood in your stool. You may also have mild cramping and bloating of your abdomen.  For the first 24 hours after the procedure, do not drive or use machinery, sign important documents, or drink alcohol.  Get help right away if you have a lot of blood in your stool, nausea or vomiting, a fever, or increased pain in your abdomen. This information is not intended to replace advice given to you by your health care provider. Make sure you discuss any questions you have with your health  care provider. Document Revised: 02/21/2019 Document Reviewed: 02/21/2019 Elsevier Patient Education  [place].  Colon Polyps  Polyps are tissue growths inside the body. Polyps can grow in many places, including the large intestine (colon). A polyp may be a round bump or a mushroom-shaped growth. You could have one polyp or several. Most colon polyps are noncancerous (benign). However, some colon polyps can become cancerous over time. Finding and removing the polyps early can help prevent this. What are  the causes? The exact cause of colon polyps is not known. What increases the risk? You are more likely to develop this condition if you:  Have a family history of colon cancer or colon polyps.  Are older than 24 or older than 45 if you are African American.  Have inflammatory bowel disease, such as ulcerative colitis or Crohn's disease.  Have certain hereditary conditions, such as: ? Familial adenomatous polyposis. ? Lynch syndrome. ? Turcot syndrome. ? Peutz-Jeghers syndrome.  Are overweight.  Smoke cigarettes.  Do not get enough exercise.  Drink too much alcohol.  Eat a diet that is high in fat and red meat and low in fiber.  Had childhood cancer that was treated with abdominal radiation. What are the signs or symptoms? Most polyps do not cause symptoms. If you have symptoms, they may include:  Blood coming from your rectum when having a bowel movement.  Blood in your stool. The stool may look dark red or black.  Abdominal pain.  A change in bowel habits, such as constipation or diarrhea. How is this diagnosed? This condition is diagnosed with a colonoscopy. This is a procedure in which a lighted, flexible scope is inserted into the anus and then passed into the colon to examine the area. Polyps are sometimes found when a colonoscopy is done as part of routine cancer screening tests. How is this treated? Treatment for this condition involves removing any polyps that are found. Most polyps can be removed during a colonoscopy. Those polyps will then be tested for cancer. Additional treatment may be needed depending on the results of testing. Follow these instructions at home: Lifestyle  Maintain a healthy weight, or lose weight if recommended by your health care provider.  Exercise every day or as told by your health care provider.  Do not use any products that contain nicotine or tobacco, such as cigarettes and e-cigarettes. If you need help quitting, ask your  health care provider.  If you drink alcohol, limit how much you have: ? 0-1 drink a day for women. ? 0-2 drinks a day for men.  Be aware of how much alcohol is in your drink. In the [place] one drink equals one 12 oz bottle of beer (355 mL), one 5 oz glass of wine (148 mL), or one 1 oz shot of hard liquor (44 mL). Eating and drinking   Eat foods that are high in fiber, such as fruits, vegetables, and whole grains.  Eat foods that are high in calcium and vitamin D, such as milk, cheese, yogurt, eggs, liver, fish, and broccoli.  Limit foods that are high in fat, such as fried foods and desserts.  Limit the amount of red meat and processed meat you eat, such as hot dogs, sausage, bacon, and lunch meats. General instructions  Keep all follow-up visits as told by your health care provider. This is important. ? This includes having regularly scheduled colonoscopies. ? Talk to your health care provider about when you need a colonoscopy. Contact a  health care provider if:  You have new or worsening bleeding during a bowel movement.  You have new or increased blood in your stool.  You have a change in bowel habits.  You lose weight for no known reason. Summary  Polyps are tissue growths inside the body. Polyps can grow in many places, including the colon.  Most colon polyps are noncancerous (benign), but some can become cancerous over time.  This condition is diagnosed with a colonoscopy.  Treatment for this condition involves removing any polyps that are found. Most polyps can be removed during a colonoscopy. This information is not intended to replace advice given to you by your health care provider. Make sure you discuss any questions you have with your health care provider. Document Revised: 11/12/2017 Document Reviewed: 11/12/2017 Elsevier Patient Education  [place] POST-ANESTHESIA  IMMEDIATELY FOLLOWING SURGERY:  Do not drive or operate  machinery for the first twenty four hours after surgery.  Do not make any important decisions for twenty four hours after surgery or while taking narcotic pain medications or sedatives.  If you develop intractable nausea and vomiting or a severe headache please notify your doctor immediately.  FOLLOW-UP:  Please make an appointment with your surgeon as instructed. You do not need to follow up with anesthesia unless specifically instructed to do so.  WOUND CARE INSTRUCTIONS (if applicable):  Keep a dry clean dressing on the anesthesia/puncture wound site if there is drainage.  Once the wound has quit draining you may leave it open to air.  Generally you should leave the bandage intact for twenty four hours unless there is drainage.  If the epidural site drains for more than 36-48 hours please call the anesthesia department.  QUESTIONS?:  Please feel free to call your physician or the hospital operator if you have any questions, and they will be happy to assist you.

## 2020-08-17 NOTE — Transfer of Care (Signed)
Immediate Anesthesia Transfer of Care Note  Patient: Ricardo Zuniga  Procedure(s) Performed: COLONOSCOPY WITH PROPOFOL (N/A ) ESOPHAGOGASTRODUODENOSCOPY (EGD) WITH PROPOFOL (N/A ) BIOPSY POLYPECTOMY  Patient Location: PACU  Anesthesia Type:General  Level of Consciousness: awake, alert , oriented and patient cooperative  Airway & Oxygen Therapy: Patient Spontanous Breathing  Post-op Assessment: Report given to RN, Post -op Vital signs reviewed and stable and Patient moving all extremities X 4  Post vital signs: Reviewed and stable  Last Vitals:  Vitals Value Taken Time  BP 119/73 08/17/20 0952  Temp    Pulse 77 08/17/20 0955  Resp 16 08/17/20 0955  SpO2 100 % 08/17/20 0955  Vitals shown include unvalidated device data.  Last Pain:  Vitals:   08/17/20 0822  TempSrc:   PainSc: 0-No pain      Patients Stated Pain Goal: 6 (88/11/03 1594)  Complications: No complications documented.

## 2020-08-17 NOTE — H&P (Signed)
Ricardo Zuniga is an 70 y.o. male.   Chief Complaint: chest, abdominal pain and screening colonoscopy HPI: Ricardo Zuniga is a 70 y.o. male with PMH APML, CHF with EF 50%, CAD, DM GERD, who presents for evaluation of chest pain, abdominal pain and screening for colorectal cancer.  Patient had presented 2 months ago episodes of chest and lower abdominal pain described as cramping.  These episodes were intermittent but resolved after having a bowel movement.  He was endorsing possible having constipation.  Endorses also having some regurgitation at that time.  Patient was advised to start taking MiraLAX and implement a low FODMAP diet, while taking Prilosec 10 mg every day and Pepcid at night.  family history: grandfather colon cancer in 37s, uncle pancreatic cancer  Last EGD: 2008 - had gastric ulcers Last Colonoscopy: 2016 - small polyp in mid transverse, diverticulosis. Path tubular adenoma, repeat in 5 years per recs  Past Medical History:  Diagnosis Date  . Anal fissure   . APML (acute promyelocytic leukemia) in remission ([place])    completed treatment 04/2013  . CHF (congestive heart failure) ([place])   . Coronary artery disease    a. 50-60% RCA stenosis by cath in 2015 b. cath in 08/2018 showing 40% RCA stenosis with no obstructive CAD.   Marland Kitchen Difficult intubation   . DM type 2 (diabetes mellitus, type 2) (H[facility])    Type 2  . Gastroesophageal reflux disease   . Heart murmur   . History of kidney stones   . Hypercholesteremia   . Myocardial infarction [facility])    " silent"  . Neuromuscular disorder (H[facility])    neuropathy feet  . Neuropathy   . Nonischemic cardiomyopathy ([place])    a. EF 20% in 2015 - thought to possibly be viral induced or chemotherapy induced from treatments in 2014 b. at 35-40% by echo in 05/2016   . Peptic ulcer disease    remote  . Pneumonia   . Ureteral colic   . Wears glasses   . Wears hearing aid in both ears     Past Surgical History:  Procedure Laterality Date   . ANAL FISSURE REPAIR    . APPENDECTOMY    . CARPAL TUNNEL RELEASE Left 10/16/2017   Procedure: LEFT CARPAL TUNNEL RELEASE;  Surgeon: Earlie Server, MD;  Location: [facility];  Service: Orthopedics;  Laterality: Left;  . CARPAL TUNNEL RELEASE Right 11/25/2019   Procedure: CARPAL TUNNEL RELEASE;  Surgeon: Earlie Server, MD;  Location: [facility];  Service: Orthopedics;  Laterality: Right;  . CATARACT EXTRACTION, BILATERAL    . CERVICAL DISC ARTHROPLASTY N/A 01/05/2018   Procedure: Artificial Cervical Disc ReplacementCervical Three-Four, Cervical Four-Five;  Surgeon: Kristeen Miss, MD;  Location: [place];  Service: Neurosurgery;  Laterality: N/A;  . CERVICAL FUSION    . CHOLECYSTECTOMY    . COLONOSCOPY N/A 06/27/2015   Procedure: COLONOSCOPY;  Surgeon: Rogene Houston, MD;  [facility];  Service: Endoscopy;  Laterality: N/A;  730  . KNEE SURGERY     x3  . LEFT AND RIGHT HEART CATHETERIZATION WITH CORONARY ANGIOGRAM N/A 12/12/2013   Procedure: LEFT AND RIGHT HEART CATHETERIZATION WITH CORONARY ANGIOGRAM;  Surgeon: [name], MD;  [facility] CATH LAB;  Service: Cardiovascular;  Laterality: N/A;  . LEFT HEART CATH AND CORONARY ANGIOGRAPHY N/A 09/17/2018   Procedure: LEFT HEART CATH AND CORONARY ANGIOGRAPHY;  Surgeon: Wellington Hampshire, MD;  Location: [facility] CV LAB;  Service: Cardiovascular;  Laterality: N/A;  .  LUMBAR FUSION    . SHOULDER ACROMIOPLASTY Right 07/18/2015   Procedure: SHOULDER ACROMIOPLASTY;  Surgeon: Earlie Server, MD;  Location: [place];  Service: Orthopedics;  Laterality: Right;  . SHOULDER ARTHROSCOPY Right 07/18/2015   Procedure: Right shoulder arthroscopy with debridement;  Surgeon: Earlie Server, MD;  Location: [facility];  Service: Orthopedics;  Laterality: Right;  . SHOULDER SURGERY     x3  . TONSILLECTOMY      Family History  Problem Relation Age of Onset  . Colon cancer Other    Social History:  reports that he quit  smoking about 34 years ago. His smoking use included cigarettes. He started smoking about 52 years ago. He has a 34.00 pack-year smoking history. He has never used smokeless tobacco. He reports that he does not drink alcohol and does not use drugs.  Allergies:  Allergies  Allergen Reactions  . Lisinopril Cough    Medications Prior to Admission  Medication Sig Dispense Refill  . acetaminophen (TYLENOL) 650 MG CR tablet Take 1,300 mg by mouth every 8 (eight) hours as needed for pain.    Marland Kitchen aspirin EC 81 MG tablet Take 81 mg by mouth daily.    Marland Kitchen atorvastatin (LIPITOR) 80 MG tablet Take 40 mg by mouth every evening.     . cetirizine (ZYRTEC) 10 MG tablet Take 10 mg by mouth daily as needed for allergies.    . diazepam (VALIUM) 2 MG tablet Take 1 tablet (2 mg total) by mouth every 8 (eight) hours as needed for anxiety or muscle spasms. 20 tablet 0  . docusate sodium (COLACE) 100 MG capsule Take 200 mg by mouth at bedtime.    Marland Kitchen ENTRESTO 24-26 MG Take 1 tablet by mouth twice daily (Patient taking differently: Take 1 tablet by mouth 2 (two) times daily.) 180 tablet 2  . furosemide (LASIX) 20 MG tablet TAKE 1 TABLET BY MOUTH ONCE DAILY AS DIRECTED (Patient taking differently: Take 20 mg by mouth daily. TAKE 1 TABLET BY MOUTH ONCE DAILY AS DIRECTED) 90 tablet 1  . INVOKANA 300 MG TABS Take 300 mg by mouth daily.     . metoprolol succinate (TOPROL XL) 25 MG 24 hr tablet Take 0.5 tablets (12.5 mg total) by mouth daily.    Marland Kitchen omeprazole (PRILOSEC) 20 MG capsule Take by mouth daily before breakfast.    . repaglinide (PRANDIN) 2 MG tablet Take 2 mg by mouth 3 (three) times daily before meals.     . zinc gluconate 50 MG tablet Take 50 mg by mouth daily.    Marland Kitchen amoxicillin-clavulanate (AUGMENTIN) 875-125 MG tablet Take 1 tablet by mouth 2 (two) times daily.    Marland Kitchen dextromethorphan-guaiFENesin (MUCINEX DM) 30-600 MG 12hr tablet Take 1 tablet by mouth 2 (two) times daily. (Patient not taking: Reported on 08/07/2020)  14 tablet 1  . traMADol (ULTRAM) 50 MG tablet Take 1 tablet (50 mg total) by mouth every 6 (six) hours as needed. (Patient taking differently: Take 50 mg by mouth every 6 (six) hours as needed for severe pain.) 20 tablet 0    Results for orders placed or performed during the hospital encounter of 08/17/20 (from the past 48 hour(s))  Glucose, capillary     Status: Abnormal   Collection Time: 08/17/20  7:04 AM  Result Value Ref Range   Glucose-Capillary 147 (H) 70 - 99 mg/dL    Comment: Glucose reference range applies only to samples taken after fasting for at least 8 hours.  No results found.  Review of Systems  Constitutional: Negative.   HENT: Negative.   Eyes: Negative.   Respiratory: Negative.   Cardiovascular: Positive for chest pain.  Gastrointestinal: Positive for abdominal pain.  Endocrine: Negative.   Genitourinary: Negative.   Musculoskeletal: Negative.   Allergic/Immunologic: Negative.   Neurological: Negative.   Hematological: Negative.   Psychiatric/Behavioral: Negative.     Blood pressure 133/75, pulse 97, temperature 98.7 F (37.1 C), temperature source Oral, resp. rate 16, SpO2 98 %. Physical Exam  GENERAL: The patient is AO x3, in no acute distress. HEENT: Head is normocephalic and atraumatic. EOMI are intact. Mouth is well hydrated and without lesions. NECK: Supple. No masses LUNGS: Clear to auscultation. No presence of rhonchi/wheezing/rales. Adequate chest expansion HEART: RRR, normal s1 and s2. ABDOMEN: Soft, nontender, no guarding, no peritoneal signs, and nondistended. BS +. No masses. EXTREMITIES: Without any cyanosis, clubbing, rash, lesions or edema. NEUROLOGIC: AOx3, no focal motor deficit. SKIN: no jaundice, no rashes  Assessment/Plan Ricardo Zuniga is a 70 y.o. male with PMH APML, CHF with EF 50%, CAD, DM GERD, who presents for evaluation of chest pain, abdominal pain and screening for colorectal cancer. Will proceed with EGD and  colonoscopy.  Harvel Quale, MD 08/17/2020, 7:41 AM

## 2020-08-20 LAB — SURGICAL PATHOLOGY

## 2020-08-22 ENCOUNTER — Encounter (HOSPITAL_COMMUNITY): Payer: Self-pay | Admitting: Gastroenterology

## 2020-09-03 ENCOUNTER — Encounter (INDEPENDENT_AMBULATORY_CARE_PROVIDER_SITE_OTHER): Payer: Self-pay | Admitting: *Deleted

## 2020-09-10 ENCOUNTER — Other Ambulatory Visit: Payer: Self-pay | Admitting: *Deleted

## 2020-09-10 MED ORDER — ENTRESTO 24-26 MG PO TABS: 1.0000 | ORAL_TABLET | Freq: Two times a day (BID) | ORAL | 2 refills | Status: DC

## 2020-09-16 ENCOUNTER — Encounter: Payer: Self-pay | Admitting: Cardiology

## 2020-09-16 NOTE — Progress Notes (Signed)
Cardiology Office Note  Date: 09/17/2020   ID: Ricardo, Zuniga 1951/01/30, MRN [id number]  PCP:  Glenda Chroman, Zuniga  Cardiologist:  [name], Zuniga Electrophysiologist:  None   Chief Complaint  Patient presents with  . Cardiac follow-up    History of Present Illness: Ricardo Zuniga is a 70 y.o. male former patient of Dr. Bronson Ing now presenting to establish follow-up with me.  I reviewed his records and updated the chart.  He was last seen in August 2021 by [name].  He tells that he has been doing well overall, fatigue has improved after cutting back beta-blocker, reports NYHA class II dyspnea with typical activities. He does not describe any exertional chest pain, no palpitations or syncope.  Follow-up echocardiogram in July 2021 revealed improvement in LVEF to 46%, mild diastolic dysfunction, and normal RV contraction.  Blood pressure was elevated today. He states that he checks his blood pressure at home with an automatic cuff daily, this morning's reading was 128/72.  He reports compliance with his medications. Current cardiac regimen includes Toprol-XL, Entresto, and Lasix. He had recent lab work which is reviewed below.  Past Medical History:  Diagnosis Date  . Anal fissure   . APML (acute promyelocytic leukemia) in remission (Ricardo Zuniga)    Completed treatment 04/2013  . Coronary artery disease    Nonobstructive at cardiac catheterization 09/2018  . Difficult intubation   . Gastroesophageal reflux disease   . History of kidney stones   . Hypercholesteremia   . Neuropathy   . Nonischemic cardiomyopathy (Ricardo Zuniga)    a. EF 20% in 2015 - thought to possibly be viral induced or chemotherapy induced from treatments in 2014 b. at 35-40% by echo in 05/2016   . Peptic ulcer disease   . Pneumonia   . Type 2 diabetes mellitus (Ricardo Zuniga)   . Ureteral colic   . Wears glasses   . Wears hearing aid in both ears     Past Surgical History:  Procedure Laterality Date  . ANAL  FISSURE REPAIR    . APPENDECTOMY    . BIOPSY  08/17/2020   Procedure: BIOPSY;  Surgeon: Ricardo Zuniga;  [facility];  Service: Gastroenterology;;  . Wilmon Pali RELEASE Left 10/16/2017   Procedure: LEFT CARPAL TUNNEL RELEASE;  Surgeon: Ricardo Zuniga;  Location: [place];  Service: Orthopedics;  Laterality: Left;  . CARPAL TUNNEL RELEASE Right 11/25/2019   Procedure: CARPAL TUNNEL RELEASE;  Surgeon: Ricardo Zuniga;  [facility];  Service: Orthopedics;  Laterality: Right;  . CATARACT EXTRACTION, BILATERAL    . CERVICAL DISC ARTHROPLASTY N/A 01/05/2018   Procedure: Artificial Cervical Disc ReplacementCervical Three-Four, Cervical Four-Five;  Surgeon: Ricardo Zuniga;  Location: [place];  Service: Neurosurgery;  Laterality: N/A;  . CERVICAL FUSION    . CHOLECYSTECTOMY    . COLONOSCOPY N/A 06/27/2015   Procedure: COLONOSCOPY;  Surgeon: Ricardo Zuniga;  [facility];  Service: Endoscopy;  Laterality: N/A;  730  . COLONOSCOPY WITH PROPOFOL N/A 08/17/2020   Procedure: COLONOSCOPY WITH PROPOFOL;  Surgeon: Ricardo Zuniga;  [facility];  Service: Gastroenterology;  Laterality: N/A;  11:15  . ESOPHAGOGASTRODUODENOSCOPY (EGD) WITH PROPOFOL N/A 08/17/2020   Procedure: ESOPHAGOGASTRODUODENOSCOPY (EGD) WITH PROPOFOL;  Surgeon: Ricardo Zuniga;  [facility];  Service: Gastroenterology;  Laterality: N/A;  . KNEE SURGERY     x3  . LEFT AND RIGHT HEART CATHETERIZATION WITH  CORONARY ANGIOGRAM N/A 12/12/2013   Procedure: LEFT AND RIGHT HEART CATHETERIZATION WITH CORONARY ANGIOGRAM;  Surgeon: Ricardo Zuniga;  Location: [facility] CATH LAB;  Service: Cardiovascular;  Laterality: N/A;  . LEFT HEART CATH AND CORONARY ANGIOGRAPHY N/A 09/17/2018   Procedure: LEFT HEART CATH AND CORONARY ANGIOGRAPHY;  Surgeon: Ricardo Zuniga;  Location: [place] CV LAB;  Service: Cardiovascular;  Laterality: N/A;  . LUMBAR FUSION     . POLYPECTOMY  08/17/2020   Procedure: POLYPECTOMY;  Surgeon: Ricardo Zuniga;  [facility];  Service: Gastroenterology;;  . SHOULDER ACROMIOPLASTY Right 07/18/2015   Procedure: SHOULDER ACROMIOPLASTY;  Surgeon: Ricardo Zuniga;  Location: [place];  Service: Orthopedics;  Laterality: Right;  . SHOULDER ARTHROSCOPY Right 07/18/2015   Procedure: Right shoulder arthroscopy with debridement;  Surgeon: Ricardo Zuniga;  Location: [place];  Service: Orthopedics;  Laterality: Right;  . SHOULDER SURGERY     x3  . TONSILLECTOMY      Current Outpatient Medications  Medication Sig Dispense Refill  . acetaminophen (TYLENOL) 650 MG CR tablet Take 1,300 mg by mouth every 8 (eight) hours as needed for pain.    Marland Kitchen aspirin EC 81 MG tablet Take 81 mg by mouth daily.    Marland Kitchen atorvastatin (LIPITOR) 80 MG tablet Take 40 mg by mouth every evening.     . cetirizine (ZYRTEC) 10 MG tablet Take 10 mg by mouth daily as needed for allergies.    . diazepam (VALIUM) 2 MG tablet Take 1 tablet (2 mg total) by mouth every 8 (eight) hours as needed for anxiety or muscle spasms. 20 tablet 0  . docusate sodium (COLACE) 100 MG capsule Take 200 mg by mouth at bedtime.    . furosemide (LASIX) 20 MG tablet TAKE 1 TABLET BY MOUTH ONCE DAILY AS DIRECTED (Patient taking differently: Take 20 mg by mouth daily. TAKE 1 TABLET BY MOUTH ONCE DAILY AS DIRECTED) 90 tablet 1  . INVOKANA 300 MG TABS Take 300 mg by mouth daily.     . metoprolol succinate (TOPROL XL) 25 MG 24 hr tablet Take 0.5 tablets (12.5 mg total) by mouth daily.    Marland Kitchen omeprazole (PRILOSEC) 20 MG capsule Take by mouth daily before breakfast.    . repaglinide (PRANDIN) 2 MG tablet Take 2 mg by mouth 3 (three) times daily before meals.     . sacubitril-valsartan (ENTRESTO) 24-26 MG Take 1 tablet by mouth 2 (two) times daily. 180 tablet 2  . zinc gluconate 50 MG tablet Take 50 mg by mouth daily.     No current  facility-administered medications for this visit.   Allergies:  Lisinopril   ROS: No claudication.  Physical Exam: VS:  BP (!) 160/90   Pulse 96   Ht 5\' 9"  (1.753 m)   Wt 192 lb 9.6 oz (87.4 kg)   SpO2 98%   BMI 28.44 kg/m , BMI Body mass index is 28.44 kg/m.  Wt Readings from Last 3 Encounters:  09/17/20 192 lb 9.6 oz (87.4 kg)  08/15/20 186 lb (84.4 kg)  08/06/20 186 lb (84.4 kg)    General: Patient appears comfortable at rest. HEENT: Conjunctiva and lids normal, wearing a mask. Neck: Supple, no elevated JVP or carotid bruits, no thyromegaly. Lungs: Clear to auscultation, nonlabored breathing at rest. Cardiac: Regular rate and rhythm, no S3, soft systolic murmur, no pericardial rub. Abdomen: Soft, nontender, bowel sounds present. Extremities: No pitting edema, distal pulses 2+.  ECG:  An ECG dated 08/06/2020 was personally reviewed today and demonstrated:  Sinus rhythm with nonspecific T wave changes.  Recent Labwork: 11/17/2019: Hemoglobin 17.4; Platelets 265 08/15/2020: BUN 18; Creatinine, Ser 0.99; Potassium 4.3; Sodium 135   Other Studies Reviewed Today:  Cardiac catheterization 09/17/2018:  There is moderate to severe left ventricular systolic dysfunction.  LV end diastolic pressure is normal.  Prox RCA lesion is 40% stenosed.   1.  Moderate nonobstructive one-vessel coronary artery disease.  The RCA stenosis actually appears to be better than most recent cardiac catheterization in 2015. 2.  Moderately severely reduced LV systolic function with an EF of 30%.  Global hypokinesis. 3.  Mild left ventricular end-diastolic pressure.  Echocardiogram 02/22/2020: 1. Left ventricular ejection fraction, by estimation, is 50%. The left  ventricle has normal function. The left ventricle has no regional wall  motion abnormalities. Left ventricular diastolic parameters are consistent  with Grade I diastolic dysfunction  (impaired relaxation).  2. Right ventricular systolic  function is normal. The right ventricular  size is normal. There is normal pulmonary artery systolic pressure.  3. The mitral valve is normal in structure. No evidence of mitral valve  regurgitation. No evidence of mitral stenosis.  4. The aortic valve is tricuspid. Aortic valve regurgitation is mild to  moderate. No aortic stenosis is present.   Comparison(s): Echocardiogram done 09/02/18 showed an EF of 40%.   Assessment and Plan:  1. History of nonischemic cardiomyopathy with improvement in LVEF to approximately 50% by assessment in July 2021. He is clinically stable, states that he reviews his vital signs daily and that his weight has not changed substantially by home measurements. He reports no orthopnea or PND, stable NYHA class II dyspnea. Current regimen includes Toprol-XL, Entresto, and Lasix. His heart rate is in the 80s to 90s at rest, states that he feels better since cutting back beta-blocker dose. I talked with him about getting a follow-up echocardiogram in 6 months to make sure there is been no change that would necessitate further medication adjustments.  2. Mixed hyperlipidemia, on Lipitor with follow-up by Dr. Woody Seller.  3. Nonobstructive coronary artery disease by cardiac catheterization in 2020. He does not report any active angina symptoms. Continue aspirin and Lipitor.  Medication Adjustments/Labs and Tests Ordered: Current medicines are reviewed at length with the patient today.  Concerns regarding medicines are outlined above.   Tests Ordered: Orders Placed This Encounter  Procedures  . ECHOCARDIOGRAM COMPLETE    Medication Changes: No orders of the defined types were placed in this encounter.   Disposition:  Follow up 6 months in the [place] office.  Signed, Satira Sark, Zuniga, [facility] 09/17/2020 8:53 AM    [facility] [place], [place], [place] [address] Phone: [phone]; Fax: [phone]

## 2020-09-17 ENCOUNTER — Other Ambulatory Visit: Payer: Self-pay | Admitting: *Deleted

## 2020-09-17 ENCOUNTER — Encounter: Payer: Self-pay | Admitting: Cardiology

## 2020-09-17 ENCOUNTER — Ambulatory Visit (INDEPENDENT_AMBULATORY_CARE_PROVIDER_SITE_OTHER): Payer: Medicare PPO | Admitting: Cardiology

## 2020-09-17 VITALS — BP 160/90 | HR 96 | Ht 69.0 in | Wt 192.6 lb

## 2020-09-17 DIAGNOSIS — I428 Other cardiomyopathies: Secondary | ICD-10-CM

## 2020-09-17 DIAGNOSIS — E782 Mixed hyperlipidemia: Secondary | ICD-10-CM | POA: Diagnosis not present

## 2020-09-17 DIAGNOSIS — I2510 Atherosclerotic heart disease of native coronary artery without angina pectoris: Secondary | ICD-10-CM

## 2020-09-17 MED ORDER — ATORVASTATIN CALCIUM 40 MG PO TABS: 40.0000 mg | ORAL_TABLET | Freq: Every day | ORAL | 3 refills | Status: DC

## 2020-09-17 NOTE — Patient Instructions (Signed)

## 2020-09-24 ENCOUNTER — Ambulatory Visit (INDEPENDENT_AMBULATORY_CARE_PROVIDER_SITE_OTHER): Payer: Medicare PPO | Admitting: Gastroenterology

## 2020-09-24 ENCOUNTER — Other Ambulatory Visit: Payer: Self-pay

## 2020-09-24 ENCOUNTER — Encounter (INDEPENDENT_AMBULATORY_CARE_PROVIDER_SITE_OTHER): Payer: Self-pay | Admitting: Gastroenterology

## 2020-09-24 VITALS — BP 125/71 | HR 106 | Temp 98.6°F | Ht 69.0 in | Wt 192.0 lb

## 2020-09-24 DIAGNOSIS — Z86010 Personal history of colonic polyps: Secondary | ICD-10-CM

## 2020-09-24 DIAGNOSIS — K581 Irritable bowel syndrome with constipation: Secondary | ICD-10-CM

## 2020-09-24 MED ORDER — LINACLOTIDE 72 MCG PO CAPS: 72.0000 ug | ORAL_CAPSULE | Freq: Every day | ORAL | 3 refills | Status: DC

## 2020-09-24 NOTE — Patient Instructions (Signed)
Start Linzess 72 mcg qday Stop docusate for now

## 2020-09-24 NOTE — Progress Notes (Signed)
Maylon Peppers, M.D. Gastroenterology & Hepatology [facility] [address] [place], [place] [address]  Primary Care Physician: Glenda Chroman, MD [place] [address]  I will communicate my assessment and recommendations to the referring MD via EMR.  Problems: 1. IBS-C 2. Chest/abdominal pain - resolved 3. Multiple tubular adenomas colonoscopy 2021  History of Present Illness: Ricardo Zuniga is a 70 y.o. male with PMH APML, CHF with EF 50%, CAD, DM, IBS-C, GERD who presents for follow up of IBS.  The patient was last seen on 06/21/2020. At that time, the patient was ordered to undergo an EGD and colonoscopy with findings described below.  He was started on MiraLAX and was advised to stop docusate intake.  He was also advised to take a low FODMAP diet.  Patient reports he feels better than in the past as his chest pain and abdominal pain have currently resolved. He reports that he tried using the Miralax in the past but it caused significant bloating so he stopped the medication. He is now using two tablets of the Colace every day. He reports that even though he takes the medication compliantly he has to strain sometimes to have bowel movement.,He is not taking any opiates.The patient denies having any nausea, vomiting, fever, chills, hematochezia, melena, hematemesis, abdominal distention, abdominal pain, diarrhea, jaundice, pruritus or weight loss.  Last EGD: 08/17/2020, normal esophagus, stomach.  As mucosal nodule from the duodenum was biopsied with normal tissue in path Last Colonoscopy: 08/17/2020, a total of 9 polyps were removed from the colon with largest size 15 mm, all of them were tubular adenomas, repeat in 1 year  Past Medical History: Past Medical History:  Diagnosis Date  . Anal fissure   . APML (acute promyelocytic leukemia) in remission ([place])    Completed treatment 04/2013  . Coronary artery disease     Nonobstructive at cardiac catheterization 09/2018  . Difficult intubation   . Gastroesophageal reflux disease   . History of kidney stones   . Hypercholesteremia   . Neuropathy   . Nonischemic cardiomyopathy ([place])    a. EF 20% in 2015 - thought to possibly be viral induced or chemotherapy induced from treatments in 2014 b. at 35-40% by echo in 05/2016   . Peptic ulcer disease   . Pneumonia   . Type 2 diabetes mellitus ([place])   . Ureteral colic   . Wears glasses   . Wears hearing aid in both ears     Past Surgical History: Past Surgical History:  Procedure Laterality Date  . ANAL FISSURE REPAIR    . APPENDECTOMY    . BIOPSY  08/17/2020   Procedure: BIOPSY;  Surgeon: Harvel Quale, MD;  [facility];  Service: Gastroenterology;;  . Wilmon Pali RELEASE Left 10/16/2017   Procedure: LEFT CARPAL TUNNEL RELEASE;  Surgeon: Earlie Server, MD;  Location: [place];  Service: Orthopedics;  Laterality: Left;  . CARPAL TUNNEL RELEASE Right 11/25/2019   Procedure: CARPAL TUNNEL RELEASE;  Surgeon: Earlie Server, MD;  [facility];  Service: Orthopedics;  Laterality: Right;  . CATARACT EXTRACTION, BILATERAL    . CERVICAL DISC ARTHROPLASTY N/A 01/05/2018   Procedure: Artificial Cervical Disc ReplacementCervical Three-Four, Cervical Four-Five;  Surgeon: Kristeen Miss, MD;  Location: [place];  Service: Neurosurgery;  Laterality: N/A;  . CERVICAL FUSION    . CHOLECYSTECTOMY    . COLONOSCOPY N/A 06/27/2015   Procedure: COLONOSCOPY;  Surgeon: Rogene Houston,  MD;  [facility];  Service: Endoscopy;  Laterality: N/A;  730  . COLONOSCOPY WITH PROPOFOL N/A 08/17/2020   Procedure: COLONOSCOPY WITH PROPOFOL;  Surgeon: Harvel Quale, MD;  [facility];  Service: Gastroenterology;  Laterality: N/A;  11:15  . ESOPHAGOGASTRODUODENOSCOPY (EGD) WITH PROPOFOL N/A 08/17/2020   Procedure: ESOPHAGOGASTRODUODENOSCOPY (EGD) WITH PROPOFOL;  Surgeon: Harvel Quale, MD;  [facility];  Service: Gastroenterology;  Laterality: N/A;  . KNEE SURGERY     x3  . LEFT AND RIGHT HEART CATHETERIZATION WITH CORONARY ANGIOGRAM N/A 12/12/2013   Procedure: LEFT AND RIGHT HEART CATHETERIZATION WITH CORONARY ANGIOGRAM;  Surgeon: [name], MD;  [facility];  Service: Cardiovascular;  Laterality: N/A;  . LEFT HEART CATH AND CORONARY ANGIOGRAPHY N/A 09/17/2018   Procedure: LEFT HEART CATH AND CORONARY ANGIOGRAPHY;  Surgeon: Wellington Hampshire, MD;  Location: [place] CV LAB;  Service: Cardiovascular;  Laterality: N/A;  . LUMBAR FUSION    . POLYPECTOMY  08/17/2020   Procedure: POLYPECTOMY;  Surgeon: Harvel Quale, MD;  [facility];  Service: Gastroenterology;;  . SHOULDER ACROMIOPLASTY Right 07/18/2015   Procedure: SHOULDER ACROMIOPLASTY;  Surgeon: Earlie Server, MD;  Location: [place];  Service: Orthopedics;  Laterality: Right;  . SHOULDER ARTHROSCOPY Right 07/18/2015   Procedure: Right shoulder arthroscopy with debridement;  Surgeon: Earlie Server, MD;  Location: [place];  Service: Orthopedics;  Laterality: Right;  . SHOULDER SURGERY     x3  . TONSILLECTOMY      Family History: Family History  Problem Relation Age of Onset  . Colon cancer Other     Social History: Social History   Tobacco Use  Smoking Status Former Smoker  . Packs/day: 2.00  . Years: 17.00  . Pack years: 34.00  . Types: Cigarettes  . Start date: 02/17/1968  . Quit date: 02/28/1986  . Years since quitting: 34.5  Smokeless Tobacco Never Used   Social History   Substance and Sexual Activity  Alcohol Use No  . Alcohol/week: 0.0 standard drinks   Social History   Substance and Sexual Activity  Drug Use No    Allergies: Allergies  Allergen Reactions  . Lisinopril Cough    Medications: Current Outpatient Medications  Medication Sig Dispense Refill  . acetaminophen (TYLENOL) 650 MG CR  tablet Take 1,300 mg by mouth every 8 (eight) hours as needed for pain.    Marland Kitchen aspirin EC 81 MG tablet Take 81 mg by mouth daily.    Marland Kitchen atorvastatin (LIPITOR) 40 MG tablet Take 1 tablet (40 mg total) by mouth daily. 90 tablet 3  . cetirizine (ZYRTEC) 10 MG tablet Take 10 mg by mouth daily as needed for allergies.    . diazepam (VALIUM) 2 MG tablet Take 1 tablet (2 mg total) by mouth every 8 (eight) hours as needed for anxiety or muscle spasms. 20 tablet 0  . docusate sodium (COLACE) 100 MG capsule Take 200 mg by mouth at bedtime.    . furosemide (LASIX) 20 MG tablet TAKE 1 TABLET BY MOUTH ONCE DAILY AS DIRECTED (Patient taking differently: Take 20 mg by mouth daily. TAKE 1 TABLET BY MOUTH ONCE DAILY AS DIRECTED) 90 tablet 1  . INVOKANA 300 MG TABS Take 300 mg by mouth daily.     . metoprolol succinate (TOPROL XL) 25 MG 24 hr tablet Take 0.5 tablets (12.5 mg total) by mouth daily.    Marland Kitchen omeprazole (PRILOSEC) 20  MG capsule Take by mouth daily before breakfast.    . repaglinide (PRANDIN) 2 MG tablet Take 2 mg by mouth 3 (three) times daily before meals.     . sacubitril-valsartan (ENTRESTO) 24-26 MG Take 1 tablet by mouth 2 (two) times daily. 180 tablet 2  . zinc gluconate 50 MG tablet Take 50 mg by mouth daily.     No current facility-administered medications for this visit.    Review of Systems: GENERAL: negative for malaise, night sweats HEENT: No changes in hearing or vision, no nose bleeds or other nasal problems. NECK: Negative for lumps, goiter, pain and significant neck swelling RESPIRATORY: Negative for cough, wheezing CARDIOVASCULAR: Negative for chest pain, leg swelling, palpitations, orthopnea GI: SEE HPI MUSCULOSKELETAL: Negative for joint pain or swelling, back pain, and muscle pain. SKIN: Negative for lesions, rash PSYCH: Negative for sleep disturbance, mood disorder and recent psychosocial stressors. HEMATOLOGY Negative for prolonged bleeding, bruising easily, and swollen  nodes. ENDOCRINE: Negative for cold or heat intolerance, polyuria, polydipsia and goiter. NEURO: negative for tremor, gait imbalance, syncope and seizures. The remainder of the review of systems is noncontributory.   Physical Exam: BP 125/71 (BP Location: Left Arm, Patient Position: Sitting, Cuff Size: Large)   Pulse (!) 106   Temp 98.6 F (37 C) (Oral)   Ht 5\' 9"  (1.753 m)   Wt 192 lb (87.1 kg)   BMI 28.35 kg/m  GENERAL: The patient is AO x3, in no acute distress. HEENT: Head is normocephalic and atraumatic. EOMI are intact. Mouth is well hydrated and without lesions. NECK: Supple. No masses LUNGS: Clear to auscultation. No presence of rhonchi/wheezing/rales. Adequate chest expansion HEART: RRR, normal s1 and s2. ABDOMEN: Soft, nontender, no guarding, no peritoneal signs, and nondistended. BS +. No masses. EXTREMITIES: Without any cyanosis, clubbing, rash, lesions or edema. NEUROLOGIC: AOx3, no focal motor deficit. SKIN: no jaundice, no rashes  Imaging/Labs: as above  I personally reviewed and interpreted the available labs, imaging and endoscopic files.  Impression and Plan: Ricardo Zuniga is a 70 y.o. male with PMH APML, CHF with EF 50%, CAD, DM, IBS-C, GERD who presents for follow up of IBS.  The patient has presented improvement of his symptoms with the use of docusate.  Unfortunately he was not able to tolerate intake of MiraLAX as it worsened his bloating episodes.  I consider he could still have a major improvement of his symptomatology by starting a stronger medication such as Linzess 72 mcg every day, as it can also have a beneficial effect on his bloating complaints.  He can stop the docusate for now.  Finally, he will need to have a repeat colonoscopy in 1 year for surveillance due to history of colon polyps as a 15 mm polyp was resected in a piecemeal fashion.  - Start Linzess 72 mcg qday - Stop docusate for now - Repeat colonoscopy within one year  All questions  were answered.      Harvel Quale, MD Gastroenterology and Hepatology [facility]

## 2020-10-08 ENCOUNTER — Other Ambulatory Visit: Payer: Self-pay | Admitting: Family Medicine

## 2021-01-18 ENCOUNTER — Ambulatory Visit
Admission: EM | Admit: 2021-01-18 | Discharge: 2021-01-18 | Disposition: A | Discharge status: Home / Self Care | Payer: Medicare PPO | Attending: Emergency Medicine | Admitting: Emergency Medicine

## 2021-01-18 ENCOUNTER — Encounter: Payer: Self-pay | Admitting: Emergency Medicine

## 2021-01-18 ENCOUNTER — Other Ambulatory Visit: Payer: Self-pay

## 2021-01-18 DIAGNOSIS — J019 Acute sinusitis, unspecified: Secondary | ICD-10-CM

## 2021-01-18 DIAGNOSIS — R0981 Nasal congestion: Secondary | ICD-10-CM

## 2021-01-18 MED ORDER — AMOXICILLIN-POT CLAVULANATE 875-125 MG PO TABS: 1.0000 | ORAL_TABLET | Freq: Two times a day (BID) | ORAL | 0 refills | Status: AC

## 2021-01-18 NOTE — ED Provider Notes (Signed)
[place]   [id number] 01/18/21 Arrival Time: 1941   CC: sinus congestion  SUBJECTIVE: History from: patient.  Ricardo Zuniga is a 70 y.o. male who presents with sinus congestion and RT ear pain x 1 week.  Had covid last month.  Denies alleviating or aggravating factors.  Reports previous symptoms in the past.   Denies fever, chills, fatigue, rhinorrhea, sore throat, SOB, wheezing, chest pain, nausea, changes in bowel or bladder habits.    ROS: As per HPI.  All other pertinent ROS negative.     Past Medical History:  Diagnosis Date   Anal fissure    APML (acute promyelocytic leukemia) in remission ([place])    Completed treatment 04/2013   Coronary artery disease    Nonobstructive at cardiac catheterization 09/2018   Difficult intubation    Gastroesophageal reflux disease    History of kidney stones    Hypercholesteremia    Neuropathy    Nonischemic cardiomyopathy ([place])    a. EF 20% in 2015 - thought to possibly be viral induced or chemotherapy induced from treatments in 2014 b. at 35-40% by echo in 05/2016    Peptic ulcer disease    Pneumonia    Type 2 diabetes mellitus ([place])    Ureteral colic    Wears glasses    Wears hearing aid in both ears    Past Surgical History:  Procedure Laterality Date   ANAL FISSURE REPAIR     APPENDECTOMY     BIOPSY  08/17/2020   Procedure: BIOPSY;  Surgeon: Harvel Quale, MD;  [facility];  Service: Gastroenterology;;   Wilmon Pali RELEASE Left 10/16/2017   Procedure: LEFT CARPAL TUNNEL RELEASE;  Surgeon: Earlie Server, MD;  Location: [facility];  Service: Orthopedics;  Laterality: Left;   CARPAL TUNNEL RELEASE Right 11/25/2019   Procedure: CARPAL TUNNEL RELEASE;  Surgeon: Earlie Server, MD;  [facility];  Service: Orthopedics;  Laterality: Right;   CATARACT EXTRACTION, BILATERAL     CERVICAL DISC ARTHROPLASTY N/A 01/05/2018   Procedure: Artificial Cervical Disc ReplacementCervical Three-Four, Cervical  Four-Five;  Surgeon: Kristeen Miss, MD;  Location: [facility];  Service: Neurosurgery;  Laterality: N/A;   CERVICAL FUSION     CHOLECYSTECTOMY     COLONOSCOPY N/A 06/27/2015   Procedure: COLONOSCOPY;  Surgeon: Rogene Houston, MD;  [facility];  Service: Endoscopy;  Laterality: N/A;  730   COLONOSCOPY WITH PROPOFOL N/A 08/17/2020   Procedure: COLONOSCOPY WITH PROPOFOL;  Surgeon: Harvel Quale, MD;  [facility];  Service: Gastroenterology;  Laterality: N/A;  11:15   ESOPHAGOGASTRODUODENOSCOPY (EGD) WITH PROPOFOL N/A 08/17/2020   Procedure: ESOPHAGOGASTRODUODENOSCOPY (EGD) WITH PROPOFOL;  Surgeon: Harvel Quale, MD;  [facility];  Service: Gastroenterology;  Laterality: N/A;   KNEE SURGERY     x3   LEFT AND RIGHT HEART CATHETERIZATION WITH CORONARY ANGIOGRAM N/A 12/12/2013   Procedure: LEFT AND RIGHT HEART CATHETERIZATION WITH CORONARY ANGIOGRAM;  Surgeon: [name], MD;  Location: [facility];  Service: Cardiovascular;  Laterality: N/A;   LEFT HEART CATH AND CORONARY ANGIOGRAPHY N/A 09/17/2018   Procedure: LEFT HEART CATH AND CORONARY ANGIOGRAPHY;  Surgeon: Wellington Hampshire, MD;  Location: [facility] CV LAB;  Service: Cardiovascular;  Laterality: N/A;   LUMBAR FUSION     POLYPECTOMY  08/17/2020   Procedure: POLYPECTOMY;  Surgeon: Harvel Quale, MD;  [facility];  Service: Gastroenterology;;   SHOULDER ACROMIOPLASTY Right 07/18/2015  Procedure: SHOULDER ACROMIOPLASTY;  Surgeon: Earlie Server, MD;  Location: [facility];  Service: Orthopedics;  Laterality: Right;   SHOULDER ARTHROSCOPY Right 07/18/2015   Procedure: Right shoulder arthroscopy with debridement;  Surgeon: Earlie Server, MD;  Location: [facility];  Service: Orthopedics;  Laterality: Right;   SHOULDER SURGERY     x3   TONSILLECTOMY     Allergies  Allergen Reactions   Lisinopril Cough   No current facility-administered  medications on file prior to encounter.   Current Outpatient Medications on File Prior to Encounter  Medication Sig Dispense Refill   acetaminophen (TYLENOL) 650 MG CR tablet Take 1,300 mg by mouth every 8 (eight) hours as needed for pain.     aspirin EC 81 MG tablet Take 81 mg by mouth daily.     atorvastatin (LIPITOR) 40 MG tablet Take 1 tablet (40 mg total) by mouth daily. 90 tablet 3   cetirizine (ZYRTEC) 10 MG tablet Take 10 mg by mouth daily as needed for allergies.     docusate sodium (COLACE) 100 MG capsule Take 200 mg by mouth at bedtime.     furosemide (LASIX) 20 MG tablet Take 1 tablet (20 mg total) by mouth daily. TAKE 1 TABLET BY MOUTH ONCE DAILY AS DIRECTED 90 tablet 3   INVOKANA 300 MG TABS Take 300 mg by mouth daily.      linaclotide (LINZESS) 72 MCG capsule Take 1 capsule (72 mcg total) by mouth daily before breakfast. 90 capsule 3   metoprolol succinate (TOPROL-XL) 25 MG 24 hr tablet Take 1 tablet by mouth once daily 90 tablet 3   omeprazole (PRILOSEC) 20 MG capsule Take by mouth daily before breakfast.     repaglinide (PRANDIN) 2 MG tablet Take 2 mg by mouth 3 (three) times daily before meals.      sacubitril-valsartan (ENTRESTO) 24-26 MG Take 1 tablet by mouth 2 (two) times daily. 180 tablet 2   zinc gluconate 50 MG tablet Take 50 mg by mouth daily.     Social History   Socioeconomic History   Marital status: Married    Spouse name: Not on file   Number of children: Not on file   Years of education: Not on file   Highest education level: Not on file  Occupational History   Occupation: RETIRED    Comment: [employer]   Occupation: SECURITY GUARD  Tobacco Use   Smoking status: Former    Packs/day: 2.00    Years: 17.00    Pack years: 34.00    Types: Cigarettes    Start date: 02/17/1968    Quit date: 02/28/1986    Years since quitting: 34.9   Smokeless tobacco: Never  Vaping Use   Vaping Use: Never used  Substance and Sexual Activity   Alcohol use: No     Alcohol/week: 0.0 standard drinks   Drug use: No   Sexual activity: Not on file  Other Topics Concern   Not on file  Social History Narrative   Not on file   Social Determinants of Health   Financial Resource Strain: Not on file  Food Insecurity: Not on file  Transportation Needs: Not on file  Physical Activity: Not on file  Stress: Not on file  Social Connections: Not on file  Intimate Partner Violence: Not on file   Family History  Problem Relation Age of Onset   Colon cancer Other     OBJECTIVE:  Vitals:   01/18/21 1434  BP:  139/73  Pulse: 94  Resp: 19  Temp: 98.3 F (36.8 C)  TempSrc: Oral  SpO2: 94%     General appearance: alert; appears mildly fatigued, but nontoxic; speaking in full sentences and tolerating own secretions HEENT: NCAT; Ears: EACs clear, TMs pearly gray; Eyes: PERRL.  EOM grossly intact. Nose: nares patent without rhinorrhea, Throat: oropharynx clear, tonsils non erythematous or enlarged, uvula midline  Neck: supple without LAD Lungs: unlabored respirations, symmetrical air entry; cough: absent; no respiratory distress; CTAB Heart: regular rate and rhythm.   Skin: warm and dry Psychological: alert and cooperative; normal mood and affect  ASSESSMENT & PLAN:  1. Sinus congestion   2. Acute non-recurrent sinusitis, unspecified location     Meds ordered this encounter  Medications   amoxicillin-clavulanate (AUGMENTIN) 875-125 MG tablet    Sig: Take 1 tablet by mouth every 12 (twelve) hours for 10 days.    Dispense:  20 tablet    Refill:  0    Order Specific Question:   Supervising Provider    Answer:   Raylene Everts [[id number]]   Get plenty of rest and push fluids Augmentin for sinus congestion/ infection Tessalon Perles prescribed for cough Use OTC zyrtec for nasal congestion, runny nose, and/or sore throat Use OTC flonase for nasal congestion and runny nose Use medications daily for symptom relief Use OTC medications like  ibuprofen or tylenol as needed fever or pain Call or go to the ED if you have any new or worsening symptoms such as fever, worsening cough, shortness of breath, chest tightness, chest pain, turning blue, changes in mental status, etc...   Reviewed expectations re: course of current medical issues. Questions answered. Outlined signs and symptoms indicating need for more acute intervention. Patient verbalized understanding. After Visit Summary given.          Lestine Box, PA-C 01/18/21 1453

## 2021-01-18 NOTE — ED Triage Notes (Addendum)
Sinus congestion , pain to RT ear x 1 week.  Pt had covid last month

## 2021-01-18 NOTE — Discharge Instructions (Addendum)
Get plenty of rest and push fluids Augmentin for sinus congestion/ infection Tessalon Perles prescribed for cough Use OTC zyrtec for nasal congestion, runny nose, and/or sore throat Use OTC flonase for nasal congestion and runny nose Use medications daily for symptom relief Use OTC medications like ibuprofen or tylenol as needed fever or pain Call or go to the ED if you have any new or worsening symptoms such as fever, worsening cough, shortness of breath, chest tightness, chest pain, turning blue, changes in mental status, etc..Marland Kitchen

## 2021-02-14 DIAGNOSIS — I10 Essential (primary) hypertension: Secondary | ICD-10-CM | POA: Diagnosis not present

## 2021-02-14 DIAGNOSIS — I509 Heart failure, unspecified: Secondary | ICD-10-CM | POA: Diagnosis not present

## 2021-02-14 DIAGNOSIS — M19049 Primary osteoarthritis, unspecified hand: Secondary | ICD-10-CM | POA: Diagnosis not present

## 2021-02-14 DIAGNOSIS — Z299 Encounter for prophylactic measures, unspecified: Secondary | ICD-10-CM | POA: Diagnosis not present

## 2021-02-14 DIAGNOSIS — E1165 Type 2 diabetes mellitus with hyperglycemia: Secondary | ICD-10-CM | POA: Diagnosis not present

## 2021-02-14 DIAGNOSIS — I429 Cardiomyopathy, unspecified: Secondary | ICD-10-CM | POA: Diagnosis not present

## 2021-03-15 ENCOUNTER — Other Ambulatory Visit: Payer: Self-pay | Admitting: Family Medicine

## 2021-03-18 ENCOUNTER — Telehealth: Payer: Self-pay

## 2021-03-18 MED ORDER — METOPROLOL SUCCINATE ER 25 MG PO TB24: 25.0000 mg | ORAL_TABLET | Freq: Every day | ORAL | 3 refills | Status: DC

## 2021-03-18 NOTE — Telephone Encounter (Signed)
Medication refill request for Metoprolol Succinate approved and sent to [facility] [facility].

## 2021-03-27 ENCOUNTER — Ambulatory Visit (HOSPITAL_COMMUNITY)
Admission: RE | Admit: 2021-03-27 | Discharge: 2021-03-27 | Disposition: A | Discharge status: Home / Self Care | Payer: Medicare PPO | Source: Ambulatory Visit | Attending: Cardiology | Admitting: Cardiology

## 2021-03-27 ENCOUNTER — Other Ambulatory Visit: Payer: Self-pay

## 2021-03-27 ENCOUNTER — Other Ambulatory Visit: Payer: Medicare PPO

## 2021-03-27 DIAGNOSIS — I428 Other cardiomyopathies: Secondary | ICD-10-CM | POA: Diagnosis not present

## 2021-03-27 LAB — ECHOCARDIOGRAM COMPLETE
AR max vel: 2.02 cm2
AV Area VTI: 2.06 cm2
AV Area mean vel: 1.69 cm2
AV Mean grad: 4.1 mmHg
AV Peak grad: 7.1 mmHg
Ao pk vel: 1.33 m/s
Area-P 1/2: 6.17 cm2
Calc EF: 42.1 %
P 1/2 time: 302 msec
S' Lateral: 4.6 cm
Single Plane A2C EF: 42.3 %
Single Plane A4C EF: 42.1 %

## 2021-03-27 NOTE — Progress Notes (Signed)
*  PRELIMINARY RESULTS* Echocardiogram 2D Echocardiogram has been performed.  Ricardo Zuniga 03/27/2021, 9:27 AM

## 2021-03-31 NOTE — Progress Notes (Signed)
Cardiology Office Note  Date: 04/01/2021   ID: [name], [name] [birth year], MRN [id number]  PCP:  Glenda Chroman, MD  Cardiologist:  [name], MD Electrophysiologist:  None   Chief Complaint  Patient presents with   Cardiac follow-up    History of Present Illness: Ricardo Zuniga is a 70 y.o. male last seen in February.  He is here for a routine visit.  States that he feels well, NYHA class II dyspnea with typical activities, no exertional chest pain.  Recent follow-up echocardiogram revealed LVEF 40 to 45% range with global hypokinesis and mild diastolic dysfunction, normal RV contraction.  We went over his medications which are listed below.  He is on Toprol-XL and Entresto, uses low-dose Lasix once a day.  We discussed addition of Aldactone.  He continues to follow with Dr. Woody Seller for routine lab work.  Past Medical History:  Diagnosis Date   Anal fissure    APML (acute promyelocytic leukemia) in remission ([place])    Completed treatment 04/2013   Coronary artery disease    Nonobstructive at cardiac catheterization 09/2018   Difficult intubation    Gastroesophageal reflux disease    History of kidney stones    Hypercholesteremia    Neuropathy    Nonischemic cardiomyopathy ([place])    a. EF 20% in 2015 - thought to possibly be viral induced or chemotherapy induced from treatments in 2014 b. at 35-40% by echo in 05/2016    Peptic ulcer disease    Pneumonia    Type 2 diabetes mellitus ([place])    Ureteral colic    Wears glasses    Wears hearing aid in both ears     Past Surgical History:  Procedure Laterality Date   ANAL FISSURE REPAIR     APPENDECTOMY     BIOPSY  08/17/2020   Procedure: BIOPSY;  Surgeon: Harvel Quale, MD;  [facility];  Service: Gastroenterology;;   Wilmon Pali RELEASE Left 10/16/2017   Procedure: LEFT CARPAL TUNNEL RELEASE;  Surgeon: Earlie Server, MD;  Location: [facility];  Service: Orthopedics;  Laterality: Left;    CARPAL TUNNEL RELEASE Right 11/25/2019   Procedure: CARPAL TUNNEL RELEASE;  Surgeon: Earlie Server, MD;  [facility];  Service: Orthopedics;  Laterality: Right;   CATARACT EXTRACTION, BILATERAL     CERVICAL DISC ARTHROPLASTY N/A 01/05/2018   Procedure: Artificial Cervical Disc ReplacementCervical Three-Four, Cervical Four-Five;  Surgeon: Kristeen Miss, MD;  Location: [facility];  Service: Neurosurgery;  Laterality: N/A;   CERVICAL FUSION     CHOLECYSTECTOMY     COLONOSCOPY N/A 06/27/2015   Procedure: COLONOSCOPY;  Surgeon: Rogene Houston, MD;  [facility];  Service: Endoscopy;  Laterality: N/A;  730   COLONOSCOPY WITH PROPOFOL N/A 08/17/2020   Procedure: COLONOSCOPY WITH PROPOFOL;  Surgeon: Harvel Quale, MD;  [facility];  Service: Gastroenterology;  Laterality: N/A;  11:15   ESOPHAGOGASTRODUODENOSCOPY (EGD) WITH PROPOFOL N/A 08/17/2020   Procedure: ESOPHAGOGASTRODUODENOSCOPY (EGD) WITH PROPOFOL;  Surgeon: Harvel Quale, MD;  [facility];  Service: Gastroenterology;  Laterality: N/A;   KNEE SURGERY     x3   LEFT AND RIGHT HEART CATHETERIZATION WITH CORONARY ANGIOGRAM N/A 12/12/2013   Procedure: LEFT AND RIGHT HEART CATHETERIZATION WITH CORONARY ANGIOGRAM;  Surgeon: [name], MD;  [facility];  Service: Cardiovascular;  Laterality: N/A;   LEFT HEART CATH AND CORONARY ANGIOGRAPHY N/A 09/17/2018   Procedure: LEFT HEART CATH AND  CORONARY ANGIOGRAPHY;  Surgeon: Wellington Hampshire, MD;  Location: [facility] CV LAB;  Service: Cardiovascular;  Laterality: N/A;   LUMBAR FUSION     POLYPECTOMY  08/17/2020   Procedure: POLYPECTOMY;  Surgeon: Harvel Quale, MD;  [facility];  Service: Gastroenterology;;   SHOULDER ACROMIOPLASTY Right 07/18/2015   Procedure: SHOULDER ACROMIOPLASTY;  Surgeon: Earlie Server, MD;  Location: [facility];  Service: Orthopedics;  Laterality: Right;   SHOULDER ARTHROSCOPY  Right 07/18/2015   Procedure: Right shoulder arthroscopy with debridement;  Surgeon: Earlie Server, MD;  Location: [facility];  Service: Orthopedics;  Laterality: Right;   SHOULDER SURGERY     x3   TONSILLECTOMY      Current Outpatient Medications  Medication Sig Dispense Refill   acetaminophen (TYLENOL) 650 MG CR tablet Take 1,300 mg by mouth every 8 (eight) hours as needed for pain.     aspirin EC 81 MG tablet Take 81 mg by mouth daily.     atorvastatin (LIPITOR) 40 MG tablet Take 1 tablet (40 mg total) by mouth daily. 90 tablet 3   cetirizine (ZYRTEC) 10 MG tablet Take 10 mg by mouth daily as needed for allergies.     docusate sodium (COLACE) 100 MG capsule Take 200 mg by mouth at bedtime.     furosemide (LASIX) 20 MG tablet Take 1 tablet (20 mg total) by mouth daily. TAKE 1 TABLET BY MOUTH ONCE DAILY AS DIRECTED 90 tablet 3   INVOKANA 300 MG TABS Take 300 mg by mouth daily.      metoprolol succinate (TOPROL-XL) 25 MG 24 hr tablet Take 1 tablet (25 mg total) by mouth daily. 90 tablet 3   omeprazole (PRILOSEC) 20 MG capsule Take by mouth daily before breakfast.     repaglinide (PRANDIN) 2 MG tablet Take 2 mg by mouth 3 (three) times daily before meals.      sacubitril-valsartan (ENTRESTO) 24-26 MG Take 1 tablet by mouth 2 (two) times daily. 180 tablet 2   spironolactone (ALDACTONE) 25 MG tablet Take 0.5 tablets (12.5 mg total) by mouth daily. 45 tablet 3   zinc gluconate 50 MG tablet Take 50 mg by mouth daily.     No current facility-administered medications for this visit.   Allergies:  Lisinopril   ROS: No palpitations or syncope.  Physical Exam: VS:  BP 124/60   Pulse 62   Ht '5\' 9"'$  (1.753 m)   Wt 188 lb 6.4 oz (85.5 kg)   SpO2 98%   BMI 27.82 kg/m , BMI Body mass index is 27.82 kg/m.  Wt Readings from Last 3 Encounters:  04/01/21 188 lb 6.4 oz (85.5 kg)  09/24/20 192 lb (87.1 kg)  09/17/20 192 lb 9.6 oz (87.4 kg)    General: Patient appears comfortable  at rest. HEENT: Conjunctiva and lids normal, wearing a mask. Neck: Supple, no elevated JVP or carotid bruits, no thyromegaly. Lungs: Clear to auscultation, nonlabored breathing at rest. Cardiac: Regular rate and rhythm, no S3 or significant systolic murmur. Extremities: No pitting edema.  ECG:  An ECG dated 08/06/2020 was personally reviewed today and demonstrated:  Sinus rhythm with nonspecific T wave changes.  Recent Labwork: 08/15/2020: BUN 18; Creatinine, Ser 0.99; Potassium 4.3; Sodium 135   Other Studies Reviewed Today:  Echocardiogram 03/27/2021:  1. Left ventricular ejection fraction, by estimation, is 40 to 45%. The  left ventricle has mildly decreased function. The left ventricle  demonstrates global hypokinesis. Left ventricular diastolic parameters are  consistent with Grade I diastolic  dysfunction (impaired relaxation).   2. Right ventricular systolic function is normal. The right ventricular  size is normal. Tricuspid regurgitation signal is inadequate for assessing  PA pressure.   3. The mitral valve is grossly normal. No evidence of mitral valve  regurgitation. No evidence of mitral stenosis.   4. The aortic valve is tricuspid. Aortic valve regurgitation is moderate.  No aortic stenosis is present.   Assessment and Plan:  1.  Nonischemic cardiomyopathy with chronic combined heart failure, LVEF 40 to 45% by most recent evaluation and RV contraction normal.  Continue Toprol-XL, Entresto, and Lasix.  Start Aldactone 12.5 mg daily with follow-up BMET in 2 weeks.  2.  Nonobstructive CAD without angina.  He is on aspirin and Lipitor.  3.  Type 2 diabetes mellitus, on Invokana.  Medication Adjustments/Labs and Tests Ordered: Current medicines are reviewed at length with the patient today.  Concerns regarding medicines are outlined above.   Tests Ordered: Orders Placed This Encounter  Procedures   Basic metabolic panel     Medication Changes: Meds ordered this  encounter  Medications   spironolactone (ALDACTONE) 25 MG tablet    Sig: Take 0.5 tablets (12.5 mg total) by mouth daily.    Dispense:  45 tablet    Refill:  3     Disposition:  Follow up  6 months.  Signed, Satira Sark, MD, [facility] 04/01/2021 8:30 AM    [facility] [place], [place], [place] [address] Phone: [phone]; Fax: [phone]

## 2021-04-01 ENCOUNTER — Encounter: Payer: Self-pay | Admitting: Cardiology

## 2021-04-01 ENCOUNTER — Ambulatory Visit (INDEPENDENT_AMBULATORY_CARE_PROVIDER_SITE_OTHER): Payer: Medicare PPO | Admitting: Cardiology

## 2021-04-01 VITALS — BP 124/60 | HR 62 | Ht 69.0 in | Wt 188.4 lb

## 2021-04-01 DIAGNOSIS — I5042 Chronic combined systolic (congestive) and diastolic (congestive) heart failure: Secondary | ICD-10-CM | POA: Diagnosis not present

## 2021-04-01 DIAGNOSIS — I428 Other cardiomyopathies: Secondary | ICD-10-CM | POA: Diagnosis not present

## 2021-04-01 DIAGNOSIS — Z79899 Other long term (current) drug therapy: Secondary | ICD-10-CM

## 2021-04-01 MED ORDER — SPIRONOLACTONE 25 MG PO TABS: 12.5000 mg | ORAL_TABLET | Freq: Every day | ORAL | 3 refills | Status: DC

## 2021-04-01 NOTE — Patient Instructions (Signed)
Medication Instructions:   START Aldactone 12.5 mg daily  *If you need a refill on your cardiac medications before your next appointment, please call your pharmacy*   Lab Work:   BMET in 2 weeks (04/16/21)  If you have labs (blood work) drawn today and your tests are completely normal, you will receive your results only by: [place] (if you have MyChart) OR A paper copy in the mail If you have any lab test that is abnormal or we need to change your treatment, we will call you to review the results.   Testing/Procedures: None today    Follow-Up: At [facility], you and your health needs are our priority.  As part of our continuing mission to provide you with exceptional heart care, we have created designated Provider Care Teams.  These Care Teams include your primary Cardiologist (physician) and Advanced Practice Providers (APPs -  Physician Assistants and Nurse Practitioners) who all work together to provide you with the care you need, when you need it.  We recommend signing up for the patient portal called "MyChart".  Sign up information is provided on this After Visit Summary.  MyChart is used to connect with patients for Virtual Visits (Telemedicine).  Patients are able to view lab/test results, encounter notes, upcoming appointments, etc.  Non-urgent messages can be sent to your provider as well.   To learn more about what you can do with MyChart, go to [email or web address].    Your next appointment:   6 month(s)  The format for your next appointment:   In Person  Provider:   [name], MD   Other Instructions None

## 2021-04-05 DIAGNOSIS — M65342 Trigger finger, left ring finger: Secondary | ICD-10-CM | POA: Diagnosis not present

## 2021-04-05 DIAGNOSIS — M13841 Other specified arthritis, right hand: Secondary | ICD-10-CM | POA: Diagnosis not present

## 2021-04-17 DIAGNOSIS — H26493 Other secondary cataract, bilateral: Secondary | ICD-10-CM | POA: Diagnosis not present

## 2021-06-01 ENCOUNTER — Other Ambulatory Visit: Payer: Self-pay | Admitting: Cardiology

## 2021-06-07 DIAGNOSIS — E1165 Type 2 diabetes mellitus with hyperglycemia: Secondary | ICD-10-CM | POA: Diagnosis not present

## 2021-06-07 DIAGNOSIS — E7800 Pure hypercholesterolemia, unspecified: Secondary | ICD-10-CM | POA: Diagnosis not present

## 2021-06-07 DIAGNOSIS — Z1339 Encounter for screening examination for other mental health and behavioral disorders: Secondary | ICD-10-CM | POA: Diagnosis not present

## 2021-06-07 DIAGNOSIS — Z125 Encounter for screening for malignant neoplasm of prostate: Secondary | ICD-10-CM | POA: Diagnosis not present

## 2021-06-07 DIAGNOSIS — Z2821 Immunization not carried out because of patient refusal: Secondary | ICD-10-CM | POA: Diagnosis not present

## 2021-06-07 DIAGNOSIS — I429 Cardiomyopathy, unspecified: Secondary | ICD-10-CM | POA: Diagnosis not present

## 2021-06-07 DIAGNOSIS — R5383 Other fatigue: Secondary | ICD-10-CM | POA: Diagnosis not present

## 2021-06-07 DIAGNOSIS — Z1331 Encounter for screening for depression: Secondary | ICD-10-CM | POA: Diagnosis not present

## 2021-06-07 DIAGNOSIS — Z0000 Encounter for general adult medical examination without abnormal findings: Secondary | ICD-10-CM | POA: Diagnosis not present

## 2021-06-07 DIAGNOSIS — Z7189 Other specified counseling: Secondary | ICD-10-CM | POA: Diagnosis not present

## 2021-06-07 DIAGNOSIS — I10 Essential (primary) hypertension: Secondary | ICD-10-CM | POA: Diagnosis not present

## 2021-06-07 DIAGNOSIS — I700 Atherosclerosis of aorta: Secondary | ICD-10-CM | POA: Diagnosis not present

## 2021-06-07 DIAGNOSIS — Z79899 Other long term (current) drug therapy: Secondary | ICD-10-CM | POA: Diagnosis not present

## 2021-07-19 DIAGNOSIS — Z6826 Body mass index (BMI) 26.0-26.9, adult: Secondary | ICD-10-CM | POA: Diagnosis not present

## 2021-07-19 DIAGNOSIS — I270 Primary pulmonary hypertension: Secondary | ICD-10-CM | POA: Diagnosis not present

## 2021-07-19 DIAGNOSIS — Z299 Encounter for prophylactic measures, unspecified: Secondary | ICD-10-CM | POA: Diagnosis not present

## 2021-07-19 DIAGNOSIS — M545 Low back pain, unspecified: Secondary | ICD-10-CM | POA: Diagnosis not present

## 2021-07-19 DIAGNOSIS — U071 COVID-19: Secondary | ICD-10-CM | POA: Diagnosis not present

## 2021-08-02 DIAGNOSIS — R6889 Other general symptoms and signs: Secondary | ICD-10-CM | POA: Diagnosis not present

## 2021-08-02 DIAGNOSIS — J111 Influenza due to unidentified influenza virus with other respiratory manifestations: Secondary | ICD-10-CM | POA: Diagnosis not present

## 2021-08-06 ENCOUNTER — Encounter (INDEPENDENT_AMBULATORY_CARE_PROVIDER_SITE_OTHER): Payer: Self-pay | Admitting: *Deleted

## 2021-08-21 DIAGNOSIS — I700 Atherosclerosis of aorta: Secondary | ICD-10-CM | POA: Diagnosis not present

## 2021-08-21 DIAGNOSIS — I25118 Atherosclerotic heart disease of native coronary artery with other forms of angina pectoris: Secondary | ICD-10-CM | POA: Diagnosis not present

## 2021-08-21 DIAGNOSIS — Z299 Encounter for prophylactic measures, unspecified: Secondary | ICD-10-CM | POA: Diagnosis not present

## 2021-08-21 DIAGNOSIS — J320 Chronic maxillary sinusitis: Secondary | ICD-10-CM | POA: Diagnosis not present

## 2021-08-21 DIAGNOSIS — I429 Cardiomyopathy, unspecified: Secondary | ICD-10-CM | POA: Diagnosis not present

## 2021-08-21 DIAGNOSIS — I10 Essential (primary) hypertension: Secondary | ICD-10-CM | POA: Diagnosis not present

## 2021-09-07 ENCOUNTER — Other Ambulatory Visit: Payer: Self-pay | Admitting: Cardiology

## 2021-09-20 ENCOUNTER — Telehealth (INDEPENDENT_AMBULATORY_CARE_PROVIDER_SITE_OTHER): Payer: Self-pay | Admitting: *Deleted

## 2021-09-20 DIAGNOSIS — M25511 Pain in right shoulder: Secondary | ICD-10-CM | POA: Diagnosis not present

## 2021-09-20 DIAGNOSIS — Z86010 Personal history of colonic polyps: Secondary | ICD-10-CM

## 2021-09-20 MED ORDER — PEG 3350-KCL-NA BICARB-NACL 420 G PO SOLR: ORAL | 0 refills | Status: DC

## 2021-09-20 NOTE — Telephone Encounter (Signed)
Ok to schedule.  Thanks,  Keyler Hoge Castaneda Mayorga, MD Gastroenterology and Hepatology [facility]  

## 2021-09-20 NOTE — Telephone Encounter (Signed)
Referring MD/PCP: Woody Seller  Procedure: Tcs  Reason/Indication:  Hx of polyps, fam hx of colon ca  Has patient had this procedure before?  Yes   If so, when, by whom and where?  01/22  Is there a family history of colon cancer?  yes  Who?  What age when diagnosed?  grandfather  Is patient diabetic? If yes, Type 1 or Type 2   yes, type 2      Does patient have prosthetic heart valve or mechanical valve?  no  Do you have a pacemaker/defibrillator?  no  Has patient ever had endocarditis/atrial fibrillation? no  Does patient use oxygen? no  Has patient had joint replacement within last 12 months?  no  Is patient constipated or do they take laxatives? no  Does patient have a history of alcohol/drug use?  no  Have you had a stroke/heart attack last 6 mths? no  Do you take medicine for weight loss?  no  For male patients,: have you had a hysterectomy N/A                      are you post menopausal N/A                      do you still have your menstrual cycle N/A  Is patient on blood thinner such as Coumadin, Plavix and/or Aspirin? yes  Medications: asa 81 mg daily, furosemide 20 mg daily, metoprolol 2mg  daily, Invokana 300 mg daily(am ) Prilosec 20 mg daily, Aldactone 12/5 mg daily, atorvastatin 40 mg daily, repaglinide 2mg  bid, entresto 24/26 mg bid, spironolactone 12.5 mg daily  Allergies: nkda  Medication Adjustment per Dr Jenetta Downer none  Procedure date & time: 10/23/21 at 9:45 am

## 2021-09-20 NOTE — Telephone Encounter (Signed)
Called pt. He has been scheduled for TCS with Dr. Jenetta Downer, room 1 3/15 at 9:45am. Aware will mail prep instructions and send rx to pharmacy.   PA pending review via cohere website. Tracking# [id number]

## 2021-09-21 ENCOUNTER — Other Ambulatory Visit: Payer: Self-pay | Admitting: Cardiology

## 2021-09-23 NOTE — Telephone Encounter (Signed)
PA approved via cohere. Auth# [id number], DOS: 10/23/2021 - 01/21/2022

## 2021-10-11 DIAGNOSIS — J069 Acute upper respiratory infection, unspecified: Secondary | ICD-10-CM | POA: Diagnosis not present

## 2021-10-13 ENCOUNTER — Other Ambulatory Visit: Payer: Self-pay

## 2021-10-13 ENCOUNTER — Emergency Department (HOSPITAL_COMMUNITY)
Admission: EM | Admit: 2021-10-13 | Discharge: 2021-10-13 | Disposition: A | Discharge status: Home / Self Care | Payer: Medicare PPO | Attending: Emergency Medicine | Admitting: Emergency Medicine

## 2021-10-13 ENCOUNTER — Emergency Department (HOSPITAL_COMMUNITY): Payer: Medicare PPO

## 2021-10-13 ENCOUNTER — Encounter (HOSPITAL_COMMUNITY): Payer: Self-pay

## 2021-10-13 DIAGNOSIS — Z79899 Other long term (current) drug therapy: Secondary | ICD-10-CM | POA: Diagnosis not present

## 2021-10-13 DIAGNOSIS — Z7982 Long term (current) use of aspirin: Secondary | ICD-10-CM | POA: Diagnosis not present

## 2021-10-13 DIAGNOSIS — I110 Hypertensive heart disease with heart failure: Secondary | ICD-10-CM | POA: Insufficient documentation

## 2021-10-13 DIAGNOSIS — R0789 Other chest pain: Secondary | ICD-10-CM | POA: Diagnosis not present

## 2021-10-13 DIAGNOSIS — J40 Bronchitis, not specified as acute or chronic: Secondary | ICD-10-CM | POA: Diagnosis not present

## 2021-10-13 DIAGNOSIS — R0602 Shortness of breath: Secondary | ICD-10-CM | POA: Diagnosis not present

## 2021-10-13 DIAGNOSIS — I509 Heart failure, unspecified: Secondary | ICD-10-CM | POA: Diagnosis not present

## 2021-10-13 DIAGNOSIS — R079 Chest pain, unspecified: Secondary | ICD-10-CM | POA: Diagnosis present

## 2021-10-13 DIAGNOSIS — Z20822 Contact with and (suspected) exposure to covid-19: Secondary | ICD-10-CM | POA: Insufficient documentation

## 2021-10-13 DIAGNOSIS — R531 Weakness: Secondary | ICD-10-CM | POA: Diagnosis not present

## 2021-10-13 LAB — BASIC METABOLIC PANEL WITH GFR
Anion gap: 11 (ref 5–15)
BUN: 21 mg/dL (ref 8–23)
CO2: 23 mmol/L (ref 22–32)
Calcium: 9.3 mg/dL (ref 8.9–10.3)
Chloride: 104 mmol/L (ref 98–111)
Creatinine, Ser: 0.67 mg/dL (ref 0.61–1.24)
GFR, Estimated: >60 mL/min
Glucose, Bld: 185 mg/dL — ABNORMAL HIGH (ref 70–99)
Potassium: 3.8 mmol/L (ref 3.5–5.1)
Sodium: 138 mmol/L (ref 135–145)

## 2021-10-13 LAB — CBC
HCT: 43.8 % (ref 39.0–52.0)
Hemoglobin: 15.5 g/dL (ref 13.0–17.0)
MCH: 32.6 pg (ref 26.0–34.0)
MCHC: 35.4 g/dL (ref 30.0–36.0)
MCV: 92.2 fL (ref 80.0–100.0)
Platelets: 298 K/uL (ref 150–400)
RBC: 4.75 MIL/uL (ref 4.22–5.81)
RDW: 13 % (ref 11.5–15.5)
WBC: 10.1 K/uL (ref 4.0–10.5)
nRBC: 0 % (ref 0.0–0.2)

## 2021-10-13 LAB — RESP PANEL BY RT-PCR (FLU A&B, COVID) ARPGX2
Influenza A by PCR: NEGATIVE
Influenza B by PCR: NEGATIVE
SARS Coronavirus 2 by RT PCR: NEGATIVE

## 2021-10-13 LAB — BRAIN NATRIURETIC PEPTIDE: B Natriuretic Peptide: 137 pg/mL — ABNORMAL HIGH (ref 0.0–100.0)

## 2021-10-13 LAB — TROPONIN I (HIGH SENSITIVITY)
Troponin I (High Sensitivity): 15 ng/L
Troponin I (High Sensitivity): 13 ng/L

## 2021-10-13 MED ORDER — IPRATROPIUM-ALBUTEROL 0.5-2.5 (3) MG/3ML IN SOLN
3.0000 mL | Freq: Once | RESPIRATORY_TRACT | Status: AC
  Administered 2021-10-13: 3 mL via RESPIRATORY_TRACT
  Filled 2021-10-13: qty 3

## 2021-10-13 MED ORDER — METHYLPREDNISOLONE SODIUM SUCC 125 MG IJ SOLR
125.0000 mg | Freq: Once | INTRAMUSCULAR | Status: AC
  Administered 2021-10-13: 125 mg via INTRAVENOUS
  Filled 2021-10-13: qty 2

## 2021-10-13 MED ORDER — AEROCHAMBER PLUS FLO-VU MEDIUM MISC
1.0000 | Freq: Once | Status: AC
  Administered 2021-10-13: 1
  Filled 2021-10-13: qty 1

## 2021-10-13 MED ORDER — PREDNISONE 20 MG PO TABS: 40.0000 mg | ORAL_TABLET | Freq: Every day | ORAL | 0 refills | Status: DC

## 2021-10-13 MED ORDER — ALBUTEROL SULFATE HFA 108 (90 BASE) MCG/ACT IN AERS
2.0000 | INHALATION_SPRAY | Freq: Once | RESPIRATORY_TRACT | Status: AC
Start: 2021-10-13 — End: 2021-10-13
  Administered 2021-10-13: 2 via RESPIRATORY_TRACT
  Filled 2021-10-13: qty 6.7

## 2021-10-13 NOTE — ED Provider Notes (Signed)
[facility] EMERGENCY DEPARTMENT Provider Note   CSN: [id number] Arrival date & time: 10/13/21  0820     History  Chief Complaint  Patient presents with   Chest Pain    Ricardo Zuniga is a 71 y.o. male.   Chest Pain  This patient is a 71 year old male, he has a known history of hypercholesterolemia, hypertension, acid reflux and a history of congestive heart failure, and nonischemic cardiomyopathy.  The patient reports that his last ejection fraction was 40 to 45%, I have verified this with the chart review.  He had mild diastolic dysfunction but normal RV contraction.  This was thought to be related to possibly chemotherapy and a heart infection approximately 8 years ago.  The patient reports that over the last several months he developed COVID-19 then the influenza then a sinus infection and now returns after several days of having coughing, feeling very weak and short of breath on exertion.  He went to the urgent care 2 days ago and was diagnosed with some type of upper respiratory infection and given doxycycline.  Wilburn Mylar was okay but today when he tried to get up and get ready for church he noticed that he was extremely weak, short of breath and had difficulty doing anything around the house without feeling like he was going to give out.  No new medication changes other than the doxycycline.  No swelling of the legs, no fever, he did have some chest discomfort but that has resolved.  He has had essentially nonobstructive coronary disease on prior heart cath recently  Home Medications Prior to Admission medications   Medication Sig Start Date End Date Taking? Authorizing Provider  predniSONE (DELTASONE) 20 MG tablet Take 2 tablets (40 mg total) by mouth daily. 10/13/21  Yes Noemi Chapel, MD  acetaminophen (TYLENOL) 650 MG CR tablet Take 1,300 mg by mouth every 8 (eight) hours as needed for pain.    [provider]  aspirin EC 81 MG tablet Take 81 mg by mouth daily.     [provider]  atorvastatin (LIPITOR) 40 MG tablet Take 1 tablet by mouth once daily 09/23/21   Satira Sark, MD  cetirizine (ZYRTEC) 10 MG tablet Take 10 mg by mouth daily as needed for allergies.    [provider]  docusate sodium (COLACE) 100 MG capsule Take 200 mg by mouth at bedtime.    [provider]  ENTRESTO 24-26 MG Take 1 tablet by mouth twice daily 06/03/21   Satira Sark, MD  furosemide (LASIX) 20 MG tablet TAKE 1 TABLET BY MOUTH ONCE DAILY AS DIRECTED 09/09/21   Satira Sark, MD  INVOKANA 300 MG TABS Take 300 mg by mouth daily.  12/05/13   [provider]  metoprolol succinate (TOPROL-XL) 25 MG 24 hr tablet Take 1 tablet (25 mg total) by mouth daily. 03/18/21   Verta Ellen., NP  omeprazole (PRILOSEC) 20 MG capsule Take by mouth daily before breakfast.    [provider]  polyethylene glycol-electrolytes (NULYTELY) 420 g solution As directed 09/20/21   Montez Morita, Quillian Quince, MD  repaglinide (PRANDIN) 2 MG tablet Take 2 mg by mouth 3 (three) times daily before meals.  07/06/17   [provider]  spironolactone (ALDACTONE) 25 MG tablet Take 0.5 tablets (12.5 mg total) by mouth daily. 04/01/21 06/30/21  Satira Sark, MD  zinc gluconate 50 MG tablet Take 50 mg by mouth daily.    [provider]  Allergies    Lisinopril    Review of Systems   Review of Systems  Cardiovascular:  Positive for chest pain.  All other systems reviewed and are negative.  Physical Exam Updated Vital Signs BP 127/69    Pulse (!) 107    Temp 98 F (36.7 C) (Oral)    Resp 20    Ht 1.753 m ('5\' 9"'$ )    Wt 83 kg    SpO2 92%    BMI 27.02 kg/m  Physical Exam Vitals and nursing note reviewed.  Constitutional:      General: He is not in acute distress.    Appearance: He is well-developed.  HENT:     Head: Normocephalic and atraumatic.     Mouth/Throat:     Pharynx: No oropharyngeal exudate.  Eyes:      General: No scleral icterus.       Right eye: No discharge.        Left eye: No discharge.     Conjunctiva/sclera: Conjunctivae normal.     Pupils: Pupils are equal, round, and reactive to light.  Neck:     Thyroid: No thyromegaly.     Vascular: No JVD.  Cardiovascular:     Rate and Rhythm: Normal rate and regular rhythm.     Heart sounds: Normal heart sounds. No murmur heard.   No friction rub. No gallop.  Pulmonary:     Effort: Pulmonary effort is normal. No respiratory distress.     Breath sounds: Rales present. No wheezing.     Comments: Rales present at bases Abdominal:     General: Bowel sounds are normal. There is no distension.     Palpations: Abdomen is soft. There is no mass.     Tenderness: There is no abdominal tenderness.  Musculoskeletal:        General: No tenderness. Normal range of motion.     Cervical back: Normal range of motion and neck supple.     Right lower leg: No edema.     Left lower leg: No edema.  Lymphadenopathy:     Cervical: No cervical adenopathy.  Skin:    General: Skin is warm and dry.     Findings: No erythema or rash.  Neurological:     Mental Status: He is alert.     Coordination: Coordination normal.  Psychiatric:        Behavior: Behavior normal.    ED Results / Procedures / Treatments   Labs (all labs ordered are listed, but only abnormal results are displayed) Labs Reviewed  BASIC METABOLIC PANEL - Abnormal; Notable for the following components:      Result Value   Glucose, Bld 185 (*)    All other components within normal limits  BRAIN NATRIURETIC PEPTIDE - Abnormal; Notable for the following components:   B Natriuretic Peptide 137.0 (*)    All other components within normal limits  RESP PANEL BY RT-PCR (FLU A&B, COVID) ARPGX2  CBC  TROPONIN I (HIGH SENSITIVITY)  TROPONIN I (HIGH SENSITIVITY)    EKG EKG Interpretation  Date/Time:  Sunday October 13 2021 08:28:07 EST Ventricular Rate:  95 PR Interval:  166 QRS  Duration: 92 QT Interval:  370 QTC Calculation: 464 R Axis:   65 Text Interpretation: Normal sinus rhythm Nonspecific ST and T wave abnormality Prolonged QT Abnormal ECG When compared with ECG of 06-Aug-2020 05:31, PREVIOUS ECG IS PRESENT abnormal T waves compared with prior - no ST elevation Confirmed by Noemi Chapel [phone]) on  10/13/2021 8:34:17 AM  Radiology DG Chest 2 View  Result Date: 10/13/2021 CLINICAL DATA:  71 year old male with history of chest dullness. Shortness of breath. Generalized weakness. EXAM: CHEST - 2 VIEW COMPARISON:  Chest x-ray 08/06/2020. FINDINGS: Lung volumes are normal. Diffuse peribronchial cuffing. No consolidative airspace disease. No pleural effusions. No pneumothorax. No pulmonary nodule or mass noted. Pulmonary vasculature and the cardiomediastinal silhouette are within normal limits. Atherosclerosis in the thoracic aorta. IMPRESSION: 1. Diffuse peribronchial cuffing, which may suggest an acute bronchitis. 2. Aortic atherosclerosis. Electronically Signed   By: Vinnie Langton M.D.   On: 10/13/2021 09:24    Procedures Procedures    Medications Ordered in ED Medications  ipratropium-albuterol (DUONEB) 0.5-2.5 (3) MG/3ML nebulizer solution 3 mL (3 mLs Nebulization Given 10/13/21 1212)  methylPREDNISolone sodium succinate (SOLU-MEDROL) 125 mg/2 mL injection 125 mg (125 mg Intravenous Given 10/13/21 1153)  albuterol (VENTOLIN HFA) 108 (90 Base) MCG/ACT inhaler 2 puff (2 puffs Inhalation Given 10/13/21 1222)  AeroChamber Plus Flo-Vu Medium MISC 1 each (1 each Other Given 10/13/21 1222)    ED Course/ Medical Decision Making/ A&P                           Medical Decision Making Amount and/or Complexity of Data Reviewed Labs: ordered. Radiology: ordered.  Risk Prescription drug management.   This patient presents to the ED for concern of shortness of breath generalized weakness chest discomfort, this involves an extensive number of treatment options, and is a  complaint that carries with it a high risk of complications and morbidity.  The differential diagnosis includes congestive heart failure, acute coronary syndrome though less likely, infection such as pneumonia or sepsis, less likely to be pulmonary embolism given no significant tachycardia, no swelling of the legs   Co morbidities that complicate the patient evaluation  History of congestive heart failure hypertension   Additional history obtained:  Additional history obtained from medical record External records from outside source obtained and reviewed including prior echocardiogram reports   Lab Tests:  I Ordered, and personally interpreted labs.  The pertinent results include: Troponin is negative, second troponin is even less than that, the COVID and flu test were normal, metabolic panel was reassuring except for mild hyperglycemia, the CBC was normal and the BNP was less than 150.   Imaging Studies ordered:  I ordered imaging studies including a portable chest x-ray I independently visualized and interpreted imaging which showed no acute findings other than diffuse peribronchial cuffing, there is no signs of pneumonia or pneumothorax I agree with the radiologist interpretation   Cardiac Monitoring:  The patient was maintained on a cardiac monitor.  I personally viewed and interpreted the cardiac monitored which showed an underlying rhythm of: Normal sinus rhythm   Medicines ordered and prescription drug management:  I ordered medication including albuterol nebulizer treatment as well as a handheld metered-dose inhaler, Solu-Medrol for what appears to be bronchitis Reevaluation of the patient after these medicines showed that the patient improved I have reviewed the patients home medicines and have made adjustments as needed   Test Considered:  CT scan of the chest though this seems unlikely necessary given the patient's symptoms or not consistent with pulmonary  embolism   Critical Interventions:  Evaluation for cardiac causes of chest pain shortness of breath Albuterol inhaler Solu-Medrol Discussed all of the results with the patient, he ultimately improved significantly and is stable for discharge   Social Determinants of  Health:  None           Final Clinical Impression(s) / ED Diagnoses Final diagnoses:  Bronchitis    Rx / DC Orders ED Discharge Orders          Ordered    predniSONE (DELTASONE) 20 MG tablet  Daily        10/13/21 1306              Noemi Chapel, MD 10/13/21 1308

## 2021-10-13 NOTE — ED Triage Notes (Signed)
Pt presents to ED with complaints of mid chest dullness and generalized weakness started Wednesday. Pt also short of breath. Pt states he has been sick with congestion and cough. Pt states this morning it became worse.  ?

## 2021-10-13 NOTE — Discharge Instructions (Addendum)
Thankfully your testing is very reassuring, there is no signs of pneumonia or heart attack or heart failure today.  I would like for you to finish taking the albuterol, take 2 puffs every 4 hours as needed for the next 24 hours, you may hold onto this and take it as needed going forward for shortness of breath or coughing.  I would like for you to get lots of rest over the next couple of days and follow-up with your doctor in the clinic.  I would also like for you to take prednisone once a day for 5 days.  This may make you have difficulty sleeping or anxious or a little bit angry as you have already commented that this does give you a bit of an ill temper. ? ?You may finish the doxycycline that was prescribed at the urgent care but be aware that there was no signs of bacterial pneumonia or infection on your work-up. ? ?I would like for you to return to the emergency department for any severe worsening symptoms ?

## 2021-10-17 DIAGNOSIS — Z299 Encounter for prophylactic measures, unspecified: Secondary | ICD-10-CM | POA: Diagnosis not present

## 2021-10-17 DIAGNOSIS — J40 Bronchitis, not specified as acute or chronic: Secondary | ICD-10-CM | POA: Diagnosis not present

## 2021-10-17 DIAGNOSIS — I10 Essential (primary) hypertension: Secondary | ICD-10-CM | POA: Diagnosis not present

## 2021-10-17 DIAGNOSIS — I509 Heart failure, unspecified: Secondary | ICD-10-CM | POA: Diagnosis not present

## 2021-10-17 DIAGNOSIS — E1165 Type 2 diabetes mellitus with hyperglycemia: Secondary | ICD-10-CM | POA: Diagnosis not present

## 2021-10-21 ENCOUNTER — Other Ambulatory Visit (HOSPITAL_COMMUNITY)
Admission: RE | Admit: 2021-10-21 | Discharge: 2021-10-21 | Disposition: A | Discharge status: Home / Self Care | Payer: Medicare PPO | Source: Intra-hospital | Attending: Gastroenterology | Admitting: Gastroenterology

## 2021-10-21 DIAGNOSIS — Z86010 Personal history of colonic polyps: Secondary | ICD-10-CM | POA: Diagnosis not present

## 2021-10-21 LAB — BASIC METABOLIC PANEL
Anion gap: 10 (ref 5–15)
BUN: 21 mg/dL (ref 8–23)
CO2: 24 mmol/L (ref 22–32)
Calcium: 9.3 mg/dL (ref 8.9–10.3)
Chloride: 102 mmol/L (ref 98–111)
Creatinine, Ser: 0.75 mg/dL (ref 0.61–1.24)
GFR, Estimated: >60 mL/min (ref >60)
Glucose, Bld: 185 mg/dL — ABNORMAL HIGH (ref 70–99)
Potassium: 4 mmol/L (ref 3.5–5.1)
Sodium: 136 mmol/L (ref 135–145)

## 2021-10-23 ENCOUNTER — Ambulatory Visit (HOSPITAL_BASED_OUTPATIENT_CLINIC_OR_DEPARTMENT_OTHER): Payer: Medicare PPO | Admitting: Certified Registered Nurse Anesthetist

## 2021-10-23 ENCOUNTER — Ambulatory Visit (HOSPITAL_COMMUNITY)
Admission: RE | Admit: 2021-10-23 | Discharge: 2021-10-23 | Disposition: A | Discharge status: Home / Self Care | Payer: Medicare PPO | Attending: Gastroenterology | Admitting: Gastroenterology

## 2021-10-23 ENCOUNTER — Other Ambulatory Visit: Payer: Self-pay

## 2021-10-23 ENCOUNTER — Ambulatory Visit (HOSPITAL_COMMUNITY): Payer: Medicare PPO | Admitting: Certified Registered Nurse Anesthetist

## 2021-10-23 ENCOUNTER — Encounter (HOSPITAL_COMMUNITY)
Admission: RE | Disposition: A | Discharge status: Home / Self Care | Payer: Self-pay | Source: Home / Self Care | Attending: Gastroenterology

## 2021-10-23 ENCOUNTER — Encounter (HOSPITAL_COMMUNITY): Payer: Self-pay | Admitting: Gastroenterology

## 2021-10-23 DIAGNOSIS — K635 Polyp of colon: Secondary | ICD-10-CM

## 2021-10-23 DIAGNOSIS — I509 Heart failure, unspecified: Secondary | ICD-10-CM | POA: Diagnosis not present

## 2021-10-23 DIAGNOSIS — I110 Hypertensive heart disease with heart failure: Secondary | ICD-10-CM | POA: Diagnosis not present

## 2021-10-23 DIAGNOSIS — Z800 Family history of malignant neoplasm of digestive organs: Secondary | ICD-10-CM | POA: Diagnosis not present

## 2021-10-23 DIAGNOSIS — K648 Other hemorrhoids: Secondary | ICD-10-CM | POA: Diagnosis not present

## 2021-10-23 DIAGNOSIS — E1140 Type 2 diabetes mellitus with diabetic neuropathy, unspecified: Secondary | ICD-10-CM | POA: Insufficient documentation

## 2021-10-23 DIAGNOSIS — I10 Essential (primary) hypertension: Secondary | ICD-10-CM | POA: Diagnosis not present

## 2021-10-23 DIAGNOSIS — Z09 Encounter for follow-up examination after completed treatment for conditions other than malignant neoplasm: Secondary | ICD-10-CM

## 2021-10-23 DIAGNOSIS — Z8711 Personal history of peptic ulcer disease: Secondary | ICD-10-CM | POA: Insufficient documentation

## 2021-10-23 DIAGNOSIS — K219 Gastro-esophageal reflux disease without esophagitis: Secondary | ICD-10-CM | POA: Diagnosis not present

## 2021-10-23 DIAGNOSIS — I2510 Atherosclerotic heart disease of native coronary artery without angina pectoris: Secondary | ICD-10-CM | POA: Insufficient documentation

## 2021-10-23 DIAGNOSIS — K5730 Diverticulosis of large intestine without perforation or abscess without bleeding: Secondary | ICD-10-CM | POA: Diagnosis not present

## 2021-10-23 DIAGNOSIS — Z87891 Personal history of nicotine dependence: Secondary | ICD-10-CM | POA: Insufficient documentation

## 2021-10-23 DIAGNOSIS — Z86010 Personal history of colonic polyps: Secondary | ICD-10-CM | POA: Diagnosis not present

## 2021-10-23 DIAGNOSIS — Z1211 Encounter for screening for malignant neoplasm of colon: Secondary | ICD-10-CM | POA: Insufficient documentation

## 2021-10-23 HISTORY — PX: POLYPECTOMY: SHX5525

## 2021-10-23 HISTORY — PX: COLONOSCOPY WITH PROPOFOL: SHX5780

## 2021-10-23 LAB — GLUCOSE, CAPILLARY: Glucose-Capillary: 113 mg/dL — ABNORMAL HIGH (ref 70–99)

## 2021-10-23 LAB — HM COLONOSCOPY

## 2021-10-23 SURGERY — COLONOSCOPY WITH PROPOFOL
Anesthesia: General

## 2021-10-23 MED ORDER — LACTATED RINGERS IV SOLN: INTRAVENOUS | Status: DC | PRN

## 2021-10-23 MED ORDER — PHENYLEPHRINE HCL (PRESSORS) 10 MG/ML IV SOLN
INTRAVENOUS | Status: DC | PRN
  Administered 2021-10-23 (×2): 100 ug via INTRAVENOUS

## 2021-10-23 MED ORDER — LACTATED RINGERS IV SOLN: INTRAVENOUS | Status: DC

## 2021-10-23 MED ORDER — PROPOFOL 10 MG/ML IV BOLUS
INTRAVENOUS | Status: DC | PRN
  Administered 2021-10-23: 60 mg via INTRAVENOUS

## 2021-10-23 MED ORDER — STERILE WATER FOR IRRIGATION IR SOLN
Status: DC | PRN
  Administered 2021-10-23: 60 mL

## 2021-10-23 MED ORDER — PROPOFOL 500 MG/50ML IV EMUL
INTRAVENOUS | Status: DC | PRN
  Administered 2021-10-23: 150 ug/kg/min via INTRAVENOUS

## 2021-10-23 NOTE — Anesthesia Preprocedure Evaluation (Signed)
Anesthesia Evaluation  ?Patient identified by MRN, date of birth, ID band ?Patient awake ? ? ? ?Reviewed: ?Allergy & Precautions, H&P , NPO status , Patient's Chart, lab work & pertinent test results, reviewed documented beta blocker date and time  ? ?Airway ?Mallampati: II ? ?TM Distance: >3 FB ?Neck ROM: full ? ? ? Dental ?no notable dental hx. ? ?  ?Pulmonary ?neg pulmonary ROS, former smoker,  ?  ?Pulmonary exam normal ?breath sounds clear to auscultation ? ? ? ? ? ? Cardiovascular ?Exercise Tolerance: Good ?hypertension, + CAD and + DOE  ? ?Rhythm:regular Rate:Normal ? ? ?  ?Neuro/Psych ?negative neurological ROS ? negative psych ROS  ? GI/Hepatic ?Neg liver ROS, PUD, GERD  Medicated,  ?Endo/Other  ?negative endocrine ROSdiabetes, Type 2 ? Renal/GU ?negative Renal ROS  ?negative genitourinary ?  ?Musculoskeletal ? ? Abdominal ?  ?Peds ? Hematology ?negative hematology ROS ?(+)   ?Anesthesia Other Findings ?1. Left ventricular ejection fraction, by estimation, is 40 to 45%. The  ?left ventricle has mildly decreased function. The left ventricle  ?demonstrates global hypokinesis. Left ventricular diastolic parameters are  ?consistent with Grade I diastolic  ?dysfunction (impaired relaxation).  ??2. Right ventricular systolic function is normal. The right ventricular  ?size is normal. Tricuspid regurgitation signal is inadequate for assessing  ?PA pressure.  ??3. The mitral valve is grossly normal. No evidence of mitral valve  ?regurgitation. No evidence of mitral stenosis.  ??4. The aortic valve is tricuspid. Aortic valve regurgitation is moderate.  ?No aortic stenosis is present.  ? Reproductive/Obstetrics ?negative OB ROS ? ?  ? ? ? ? ? ? ? ? ? ? ? ? ? ?  ?  ? ? ? ? ? ? ? ? ?Anesthesia Physical ?Anesthesia Plan ? ?ASA: 3 ? ?Anesthesia Plan: General  ? ?Post-op Pain Management:   ? ?Induction:  ? ?PONV Risk Score and Plan: Propofol infusion ? ?Airway Management Planned:   ? ?Additional Equipment:  ? ?Intra-op Plan:  ? ?Post-operative Plan:  ? ?Informed Consent: I have reviewed the patients History and Physical, chart, labs and discussed the procedure including the risks, benefits and alternatives for the proposed anesthesia with the patient or authorized representative who has indicated his/her understanding and acceptance.  ? ? ? ?Dental Advisory Given ? ?Plan Discussed with: CRNA ? ?Anesthesia Plan Comments:   ? ? ? ? ? ? ?Anesthesia Quick Evaluation ? ?

## 2021-10-23 NOTE — Anesthesia Postprocedure Evaluation (Signed)
Anesthesia Post Note ? ?Patient: RAVIN DENARDO ? ?Procedure(s) Performed: COLONOSCOPY WITH PROPOFOL ?POLYPECTOMY ? ?Patient location during evaluation: Phase II ?Anesthesia Type: General ?Level of consciousness: awake ?Pain management: pain level controlled ?Vital Signs Assessment: post-procedure vital signs reviewed and stable ?Respiratory status: spontaneous breathing and respiratory function stable ?Cardiovascular status: blood pressure returned to baseline and stable ?Postop Assessment: no headache and no apparent nausea or vomiting ?Anesthetic complications: no ?Comments: Late entry ? ? ?No notable events documented. ? ? ?Last Vitals:  ?Vitals:  ? 10/23/21 0827 10/23/21 1024  ?BP: 130/71 (!) 152/53  ?Pulse:  61  ?Resp: 20 15  ?Temp: 36.4 ?C 36.5 ?C  ?SpO2: 98% 98%  ?  ?Last Pain:  ?Vitals:  ? 10/23/21 1024  ?TempSrc: Oral  ?PainSc: 0-No pain  ? ? ?  ?  ?  ?  ?  ?  ? ?Louann Sjogren ? ? ? ? ?

## 2021-10-23 NOTE — Transfer of Care (Signed)
Immediate Anesthesia Transfer of Care Note ? ?Patient: Ricardo Zuniga ? ?Procedure(s) Performed: COLONOSCOPY WITH PROPOFOL ?POLYPECTOMY ? ?Patient Location: PACU ? ?Anesthesia Type:General ? ?Level of Consciousness: awake, alert  and oriented ? ?Airway & Oxygen Therapy: Patient Spontanous Breathing ? ?Post-op Assessment: Report given to RN, Post -op Vital signs reviewed and stable, Patient moving all extremities X 4 and Patient able to stick tongue midline ? ?Post vital signs: Reviewed ? ?Last Vitals:  ?Vitals Value Taken Time  ?BP 168/72   ?Temp 97.1   ?Pulse 67   ?Resp 18   ?SpO2 100   ? ? ?Last Pain:  ?Vitals:  ? 10/23/21 0942  ?TempSrc:   ?PainSc: 0-No pain  ?   ? ?Patients Stated Pain Goal: 8 (10/23/21 0827) ? ?Complications: No notable events documented. ?

## 2021-10-23 NOTE — H&P (Signed)
Ricardo Zuniga is an 71 y.o. male.   ?Chief Complaint: History of colonic polyps, piecemeal colonic polyp resection ?HPI:  Ricardo Zuniga is a 71 y.o. male with PMH APML, CHF with EF 50%, CAD, DM GERD, who presents for colonoscopy due to history of colonic polyps and piecemeal colonic polyp resection. ? ?Last colonoscopy 08/17/2020 ?Two 4 to 5 mm polyps in the ascending colon, removed with a cold snare. Resected and ?retrieved. ?- Five 2 to 8 mm polyps in the descending colon, removed with a cold snare. Resected and ?retrieved. ?- One 15 mm polyp in the descending colon, removed piecemeal using a hot snare. ?Resected and retrieved. ?- One 8 mm polyp in the rectum, removed with a cold snare. Resected and retrieved. ? ?Path: ?COLON, ASCENDING, POLYPECTOMY:  ?- Tubular adenoma, negative for high grade dysplasia (X1).  ?- Uninvolved colonic mucosa (X1).  ? ?C. COLON, DESCENDING, POLYPECTOMY:  ?- Tubular adenoma, negative for high grade dysplasia (X multiple).  ? ?D. RECTUM, POLYPECTOMY:  ?- Tubular adenoma, negative for high grade dysplasia.  ? ?Past Medical History:  ?Diagnosis Date  ? Anal fissure   ? APML (acute promyelocytic leukemia) in remission ([place])   ? Completed treatment 04/2013  ? Coronary artery disease   ? Nonobstructive at cardiac catheterization 09/2018  ? Difficult intubation   ? Gastroesophageal reflux disease   ? History of kidney stones   ? Hypercholesteremia   ? Neuropathy   ? Nonischemic cardiomyopathy ([place])   ? a. EF 20% in 2015 - thought to possibly be viral induced or chemotherapy induced from treatments in 2014 b. at 35-40% by echo in 05/2016   ? Peptic ulcer disease   ? Pneumonia   ? Type 2 diabetes mellitus ([place])   ? Ureteral colic   ? Wears glasses   ? Wears hearing aid in both ears   ? ? ?Past Surgical History:  ?Procedure Laterality Date  ? ANAL FISSURE REPAIR    ? APPENDECTOMY    ? BIOPSY  08/17/2020  ? Procedure: BIOPSY;  Surgeon: Harvel Quale, MD;  [facility]: [facility];   Service: Gastroenterology;;  ? CARPAL TUNNEL RELEASE Left 10/16/2017  ? Procedure: LEFT CARPAL TUNNEL RELEASE;  Surgeon: Earlie Server, MD;  Location: [place];  Service: Orthopedics;  Laterality: Left;  ? CARPAL TUNNEL RELEASE Right 11/25/2019  ? Procedure: CARPAL TUNNEL RELEASE;  Surgeon: Earlie Server, MD;  Location: [facility];  Service: Orthopedics;  Laterality: Right;  ? CATARACT EXTRACTION, BILATERAL    ? CERVICAL DISC ARTHROPLASTY N/A 01/05/2018  ? Procedure: Artificial Cervical Disc ReplacementCervical Three-Four, Cervical Four-Five;  Surgeon: Kristeen Miss, MD;  Location: [place];  Service: Neurosurgery;  Laterality: N/A;  ? CERVICAL FUSION    ? CHOLECYSTECTOMY    ? COLONOSCOPY N/A 06/27/2015  ? Procedure: COLONOSCOPY;  Surgeon: Rogene Houston, MD;  [facility];  Service: Endoscopy;  Laterality: N/A;  730  ? COLONOSCOPY WITH PROPOFOL N/A 08/17/2020  ? Procedure: COLONOSCOPY WITH PROPOFOL;  Surgeon: Harvel Quale, MD;  [facility];  Service: Gastroenterology;  Laterality: N/A;  11:15  ? ESOPHAGOGASTRODUODENOSCOPY (EGD) WITH PROPOFOL N/A 08/17/2020  ? Procedure: ESOPHAGOGASTRODUODENOSCOPY (EGD) WITH PROPOFOL;  Surgeon: Harvel Quale, MD;  [facility];  Service: Gastroenterology;  Laterality: N/A;  ? KNEE SURGERY    ? x3  ? LEFT AND RIGHT HEART CATHETERIZATION WITH CORONARY ANGIOGRAM N/A 12/12/2013  ? Procedure: LEFT AND RIGHT HEART CATHETERIZATION WITH CORONARY ANGIOGRAM;  Surgeon:  [name], MD;  Location[facility] CATH LAB;  Service: Cardiovascular;  Laterality: N/A;  ? LEFT HEART CATH AND CORONARY ANGIOGRAPHY N/A 09/17/2018  ? Procedure: LEFT HEART CATH AND CORONARY ANGIOGRAPHY;  Surgeon: Wellington Hampshire, MD;  Location: [place] [facility];  Service: Cardiovascular;  Laterality: N/A;  ? LUMBAR FUSION    ? POLYPECTOMY  08/17/2020  ? Procedure: POLYPECTOMY;  Surgeon: Harvel Quale, MD;  [facility];  Service: Gastroenterology;;  ? SHOULDER  ACROMIOPLASTY Right 07/18/2015  ? Procedure: SHOULDER ACROMIOPLASTY;  Surgeon: Earlie Server, MD;  Location: [place];  Service: Orthopedics;  Laterality: Right;  ? SHOULDER ARTHROSCOPY Right 07/18/2015  ? Procedure: Right shoulder arthroscopy with debridement;  Surgeon: Earlie Server, MD;  Location: [place];  Service: Orthopedics;  Laterality: Right;  ? SHOULDER SURGERY    ? x3  ? TONSILLECTOMY    ? ? ?Family History  ?Problem Relation Age of Onset  ? Colon cancer Other   ? ?Social History:  reports that he quit smoking about 35 years ago. His smoking use included cigarettes. He started smoking about 53 years ago. He has a 34.00 pack-year smoking history. He has never used smokeless tobacco. He reports that he does not drink alcohol and does not use drugs. ? ?Allergies:  ?Allergies  ?Allergen Reactions  ? Lisinopril Cough  ? ? ?Medications Prior to Admission  ?Medication Sig Dispense Refill  ? acetaminophen (TYLENOL) 650 MG CR tablet Take 1,300 mg by mouth every 8 (eight) hours as needed for pain.    ? albuterol (VENTOLIN HFA) 108 (90 Base) MCG/ACT inhaler Inhale 2 puffs into the lungs See admin instructions. Inhale 2 puffs 4 times daily until 3/12 then 2 puffs every 6 hours as needed for shortness of breath    ? atorvastatin (LIPITOR) 40 MG tablet Take 1 tablet by mouth once daily (Patient taking differently: Take 40 mg by mouth every evening.) 90 tablet 0  ? Cholecalciferol (VITAMIN D3 PO) Take 1 tablet by mouth in the morning.    ? docusate sodium (COLACE) 100 MG capsule Take 100 mg by mouth at bedtime.    ? ENTRESTO 24-26 MG Take 1 tablet by mouth twice daily 180 tablet 1  ? furosemide (LASIX) 20 MG tablet TAKE 1 TABLET BY MOUTH ONCE DAILY AS DIRECTED (Patient taking differently: Take 20 mg by mouth in the morning.) 90 tablet 0  ? guaiFENesin (MUCINEX) 600 MG 12 hr tablet Take 600 mg by mouth daily.    ? INVOKANA 300 MG TABS Take 300 mg by mouth in the morning.    ? metoprolol  succinate (TOPROL-XL) 25 MG 24 hr tablet Take 1 tablet (25 mg total) by mouth daily. (Patient taking differently: Take 12.5 mg by mouth daily.) 90 tablet 3  ? omeprazole (PRILOSEC) 20 MG capsule Take by mouth daily before breakfast.    ? polyethylene glycol-electrolytes (NULYTELY) 420 g solution As directed 4000 mL 0  ? repaglinide (PRANDIN) 2 MG tablet Take 2 mg by mouth 3 (three) times daily before meals.     ? sodium chloride (OCEAN) 0.65 % nasal spray Place 1 spray into the nose as needed for congestion.    ? spironolactone (ALDACTONE) 25 MG tablet Take 0.5 tablets (12.5 mg total) by mouth daily. 45 tablet 3  ? zinc gluconate 50 MG tablet Take 50 mg by mouth daily.    ? aspirin EC 81 MG tablet Take 81 mg by mouth daily.    ?  predniSONE (DELTASONE) 20 MG tablet Take 2 tablets (40 mg total) by mouth daily. (Patient not taking: Reported on 10/18/2021) 10 tablet 0  ? ? ?Results for orders placed or performed during the hospital encounter of 10/23/21 (from the past 48 hour(s))  ?Glucose, capillary     Status: Abnormal  ? Collection Time: 10/23/21  8:23 AM  ?Result Value Ref Range  ? Glucose-Capillary 113 (H) 70 - 99 mg/dL  ?  Comment: Glucose reference range applies only to samples taken after fasting for at least 8 hours.  ? ?No results found. ? ?Review of Systems  ?Constitutional: Negative.   ?HENT: Negative.    ?Eyes: Negative.   ?Respiratory: Negative.    ?Cardiovascular: Negative.   ?Gastrointestinal: Negative.   ?Endocrine: Negative.   ?Genitourinary: Negative.   ?Musculoskeletal: Negative.   ?Skin: Negative.   ?Allergic/Immunologic: Negative.   ?Neurological: Negative.   ?Hematological: Negative.   ?Psychiatric/Behavioral: Negative.    ? ?Blood pressure 130/71, temperature 97.6 ?F (36.4 ?C), temperature source Oral, resp. rate 20, height 5' 9.5" (1.765 m), weight 80.7 kg, SpO2 98 %. ?Physical Exam  ?GENERAL: The patient is AO x3, in no acute distress. ?HEENT: Head is normocephalic and atraumatic. EOMI are  intact. Mouth is well hydrated and without lesions. ?NECK: Supple. No masses ?LUNGS: Clear to auscultation. No presence of rhonchi/wheezing/rales. Adequate chest expansion ?HEART: RRR, normal s1 and s2. ?ABD

## 2021-10-23 NOTE — Op Note (Signed)
[employer] ?Patient Name: Ricardo Zuniga ?Procedure Date: 10/23/2021 9:35 AM ?MRN: [id number] ?Date of Birth: 05/06/1951 ?Attending MD: Maylon Peppers ,  ?CSN: [id number] ?Age: 71 ?Admit Type: Outpatient ?Procedure:                Colonoscopy ?Indications:              Surveillance: History of piecemeal removal adenoma  ?                          on last colonoscopy (< 3 yrs) ?Providers:                Maylon Peppers, [place] Page, [place] Cyndi Bender  ?                          Tech, Technician ?Referring MD:              ?Medicines:                Monitored Anesthesia Care ?Complications:            No immediate complications. ?Estimated Blood Loss:     Estimated blood loss: none. ?Procedure:                Pre-Anesthesia Assessment: ?                          - Prior to the procedure, a History and Physical  ?                          was performed, and patient medications, allergies  ?                          and sensitivities were reviewed. The patient's  ?                          tolerance of previous anesthesia was reviewed. ?                          - The risks and benefits of the procedure and the  ?                          sedation options and risks were discussed with the  ?                          patient. All questions were answered and informed  ?                          consent was obtained. ?                          - ASA Grade Assessment: II - A patient with mild  ?                          systemic disease. ?                          - Adequate visualization was aided with the use of  ?  a transparent cap attached to the distal part of  ?                          the endoscope. ?                          After obtaining informed consent, the colonoscope  ?                          was passed under direct vision. Throughout the  ?                          procedure, the patient's blood pressure, pulse, and  ?                          oxygen saturations were  monitored continuously. The  ?                          [id number] ([id number]) was introduced through the  ?                          anus and advanced to the the cecum, identified by  ?                          appendiceal orifice and ileocecal valve. The  ?                          colonoscopy was performed without difficulty. The  ?                          patient tolerated the procedure well. The quality  ?                          of the bowel preparation was good. ?Scope In: 9:47:44 AM ?Scope Out: 10:20:43 AM ?Scope Withdrawal Time: 0 hours 25 minutes 12 seconds  ?Total Procedure Duration: 0 hours 32 minutes 59 seconds  ?Findings: ?     The perianal and digital rectal examinations were normal. ?     Two sessile polyps were found in the transverse colon and cecum. The  ?     polyps were 1 mm in size. These polyps were removed with a cold biopsy  ?     forceps. Resection and retrieval were complete. ?     Four sessile polyps were found in the descending colon and transverse  ?     colon. The polyps were 3 to 5 mm in size. These polyps were removed with  ?     a cold snare. Resection and retrieval were complete. ?     A few small-mouthed diverticula were found in the sigmoid colon. ?     Non-bleeding internal hemorrhoids were found during retroflexion. The  ?     hemorrhoids were small. ?Impression:               - Two 1 mm polyps in the transverse colon and in  ?  the cecum, removed with a cold biopsy forceps.  ?                          Resected and retrieved. ?                          - Four 3 to 5 mm polyps in the descending colon and  ?                          in the transverse colon, removed with a cold snare.  ?                          Resected and retrieved. ?                          - Diverticulosis in the sigmoid colon. ?                          - Non-bleeding internal hemorrhoids. ?Moderate Sedation: ?     Per Anesthesia Care ?Recommendation:           - Discharge patient to  home (ambulatory). ?                          - Resume previous diet. ?                          - Await pathology results. ?                          - Repeat colonoscopy in 3 years for surveillance. ?Procedure Code(s):        --- Professional --- ?                          [phone], Colonoscopy, flexible; with removal of  ?                          tumor(s), polyp(s), or other lesion(s) by snare  ?                          technique ?                          45380, 59, Colonoscopy, flexible; with biopsy,  ?                          single or multiple ?Diagnosis Code(s):        --- Professional --- ?                          Z86.010, Personal history of colonic polyps ?                          K63.5, Polyp of colon ?                          K64.8, Other hemorrhoids ?  K57.30, Diverticulosis of large intestine without  ?                          perforation or abscess without bleeding ?CPT copyright 2019 [employer] Association. All rights reserved. ?The codes documented in this report are preliminary and upon coder review may  ?be revised to meet current compliance requirements. ?Maylon Peppers, MD ?Maylon Peppers,  ?10/23/2021 10:26:53 AM ?This report has been signed electronically. ?Number of Addenda: 0 ?

## 2021-10-23 NOTE — Discharge Instructions (Signed)
You are being discharged to home.  Resume your previous diet.  We are waiting for your pathology results.  Your physician has recommended a repeat colonoscopy in three years for surveillance.  

## 2021-10-24 ENCOUNTER — Encounter (INDEPENDENT_AMBULATORY_CARE_PROVIDER_SITE_OTHER): Payer: Self-pay | Admitting: *Deleted

## 2021-10-24 LAB — SURGICAL PATHOLOGY

## 2021-10-25 DIAGNOSIS — J340 Abscess, furuncle and carbuncle of nose: Secondary | ICD-10-CM | POA: Diagnosis not present

## 2021-10-25 DIAGNOSIS — Z299 Encounter for prophylactic measures, unspecified: Secondary | ICD-10-CM | POA: Diagnosis not present

## 2021-10-25 DIAGNOSIS — I10 Essential (primary) hypertension: Secondary | ICD-10-CM | POA: Diagnosis not present

## 2021-10-28 ENCOUNTER — Encounter (HOSPITAL_COMMUNITY): Payer: Self-pay | Admitting: Gastroenterology

## 2021-10-29 DIAGNOSIS — J340 Abscess, furuncle and carbuncle of nose: Secondary | ICD-10-CM | POA: Diagnosis not present

## 2021-10-29 DIAGNOSIS — Z299 Encounter for prophylactic measures, unspecified: Secondary | ICD-10-CM | POA: Diagnosis not present

## 2021-10-30 ENCOUNTER — Other Ambulatory Visit: Payer: Self-pay

## 2021-10-30 ENCOUNTER — Emergency Department (HOSPITAL_COMMUNITY): Payer: Medicare PPO

## 2021-10-30 ENCOUNTER — Encounter (HOSPITAL_COMMUNITY): Payer: Self-pay | Admitting: Emergency Medicine

## 2021-10-30 ENCOUNTER — Inpatient Hospital Stay (HOSPITAL_COMMUNITY)
Admission: EM | Admit: 2021-10-30 | Discharge: 2021-11-03 | DRG: 871 | Disposition: A | Discharge status: Home / Self Care | Payer: Medicare PPO | Attending: Internal Medicine | Admitting: Internal Medicine

## 2021-10-30 DIAGNOSIS — Z8711 Personal history of peptic ulcer disease: Secondary | ICD-10-CM

## 2021-10-30 DIAGNOSIS — Z800 Family history of malignant neoplasm of digestive organs: Secondary | ICD-10-CM

## 2021-10-30 DIAGNOSIS — C9241 Acute promyelocytic leukemia, in remission: Secondary | ICD-10-CM | POA: Diagnosis not present

## 2021-10-30 DIAGNOSIS — K219 Gastro-esophageal reflux disease without esophagitis: Secondary | ICD-10-CM | POA: Diagnosis present

## 2021-10-30 DIAGNOSIS — Z87891 Personal history of nicotine dependence: Secondary | ICD-10-CM

## 2021-10-30 DIAGNOSIS — R197 Diarrhea, unspecified: Secondary | ICD-10-CM | POA: Diagnosis not present

## 2021-10-30 DIAGNOSIS — A09 Infectious gastroenteritis and colitis, unspecified: Secondary | ICD-10-CM | POA: Diagnosis not present

## 2021-10-30 DIAGNOSIS — R17 Unspecified jaundice: Secondary | ICD-10-CM | POA: Diagnosis present

## 2021-10-30 DIAGNOSIS — E1142 Type 2 diabetes mellitus with diabetic polyneuropathy: Secondary | ICD-10-CM | POA: Diagnosis not present

## 2021-10-30 DIAGNOSIS — I672 Cerebral atherosclerosis: Secondary | ICD-10-CM | POA: Diagnosis not present

## 2021-10-30 DIAGNOSIS — L0201 Cutaneous abscess of face: Secondary | ICD-10-CM | POA: Diagnosis not present

## 2021-10-30 DIAGNOSIS — I110 Hypertensive heart disease with heart failure: Secondary | ICD-10-CM | POA: Diagnosis present

## 2021-10-30 DIAGNOSIS — I2510 Atherosclerotic heart disease of native coronary artery without angina pectoris: Secondary | ICD-10-CM | POA: Diagnosis present

## 2021-10-30 DIAGNOSIS — A419 Sepsis, unspecified organism: Secondary | ICD-10-CM | POA: Diagnosis not present

## 2021-10-30 DIAGNOSIS — E1110 Type 2 diabetes mellitus with ketoacidosis without coma: Secondary | ICD-10-CM | POA: Diagnosis not present

## 2021-10-30 DIAGNOSIS — I5042 Chronic combined systolic (congestive) and diastolic (congestive) heart failure: Secondary | ICD-10-CM | POA: Diagnosis present

## 2021-10-30 DIAGNOSIS — E1169 Type 2 diabetes mellitus with other specified complication: Secondary | ICD-10-CM | POA: Diagnosis not present

## 2021-10-30 DIAGNOSIS — I10 Essential (primary) hypertension: Secondary | ICD-10-CM | POA: Diagnosis not present

## 2021-10-30 DIAGNOSIS — Z7982 Long term (current) use of aspirin: Secondary | ICD-10-CM | POA: Diagnosis not present

## 2021-10-30 DIAGNOSIS — E7800 Pure hypercholesterolemia, unspecified: Secondary | ICD-10-CM | POA: Diagnosis present

## 2021-10-30 DIAGNOSIS — E860 Dehydration: Secondary | ICD-10-CM | POA: Diagnosis present

## 2021-10-30 DIAGNOSIS — L03211 Cellulitis of face: Secondary | ICD-10-CM | POA: Diagnosis not present

## 2021-10-30 DIAGNOSIS — A0811 Acute gastroenteropathy due to Norwalk agent: Secondary | ICD-10-CM | POA: Diagnosis present

## 2021-10-30 DIAGNOSIS — D649 Anemia, unspecified: Secondary | ICD-10-CM | POA: Diagnosis not present

## 2021-10-30 DIAGNOSIS — Z20822 Contact with and (suspected) exposure to covid-19: Secondary | ICD-10-CM | POA: Diagnosis present

## 2021-10-30 DIAGNOSIS — R112 Nausea with vomiting, unspecified: Secondary | ICD-10-CM | POA: Diagnosis not present

## 2021-10-30 DIAGNOSIS — E871 Hypo-osmolality and hyponatremia: Secondary | ICD-10-CM | POA: Diagnosis not present

## 2021-10-30 DIAGNOSIS — Z79899 Other long term (current) drug therapy: Secondary | ICD-10-CM | POA: Diagnosis not present

## 2021-10-30 DIAGNOSIS — R220 Localized swelling, mass and lump, head: Secondary | ICD-10-CM | POA: Diagnosis not present

## 2021-10-30 DIAGNOSIS — E872 Acidosis, unspecified: Secondary | ICD-10-CM | POA: Diagnosis not present

## 2021-10-30 DIAGNOSIS — I420 Dilated cardiomyopathy: Secondary | ICD-10-CM | POA: Diagnosis present

## 2021-10-30 DIAGNOSIS — E119 Type 2 diabetes mellitus without complications: Secondary | ICD-10-CM

## 2021-10-30 DIAGNOSIS — D6489 Other specified anemias: Secondary | ICD-10-CM | POA: Diagnosis present

## 2021-10-30 LAB — BASIC METABOLIC PANEL
Anion gap: 16 — ABNORMAL HIGH (ref 5–15)
BUN: 23 mg/dL (ref 8–23)
CO2: 21 mmol/L — ABNORMAL LOW (ref 22–32)
Calcium: 9.2 mg/dL (ref 8.9–10.3)
Chloride: 99 mmol/L (ref 98–111)
Creatinine, Ser: 0.91 mg/dL (ref 0.61–1.24)
GFR, Estimated: >60 mL/min (ref >60)
Glucose, Bld: 249 mg/dL — ABNORMAL HIGH (ref 70–99)
Potassium: 4.5 mmol/L (ref 3.5–5.1)
Sodium: 136 mmol/L (ref 135–145)

## 2021-10-30 LAB — CBC WITH DIFFERENTIAL/PLATELET
Abs Immature Granulocytes: 0.04 10*3/uL (ref 0.00–0.07)
Basophils Absolute: 0 10*3/uL (ref 0.0–0.1)
Basophils Relative: 0 %
Eosinophils Absolute: 0 10*3/uL (ref 0.0–0.5)
Eosinophils Relative: 0 %
HCT: 46.4 % (ref 39.0–52.0)
Hemoglobin: 16.1 g/dL (ref 13.0–17.0)
Immature Granulocytes: 0 %
Lymphocytes Relative: 2 %
Lymphs Abs: 0.2 10*3/uL — ABNORMAL LOW (ref 0.7–4.0)
MCH: 32.5 pg (ref 26.0–34.0)
MCHC: 34.7 g/dL (ref 30.0–36.0)
MCV: 93.5 fL (ref 80.0–100.0)
Monocytes Absolute: 0.3 10*3/uL (ref 0.1–1.0)
Monocytes Relative: 3 %
Neutro Abs: 11.5 10*3/uL — ABNORMAL HIGH (ref 1.7–7.7)
Neutrophils Relative %: 95 %
Platelets: 261 10*3/uL (ref 150–400)
RBC: 4.96 MIL/uL (ref 4.22–5.81)
RDW: 13.4 % (ref 11.5–15.5)
WBC: 12.2 10*3/uL — ABNORMAL HIGH (ref 4.0–10.5)
nRBC: 0 % (ref 0.0–0.2)

## 2021-10-30 LAB — BETA-HYDROXYBUTYRIC ACID: Beta-Hydroxybutyric Acid: 2.98 mmol/L — ABNORMAL HIGH (ref 0.05–0.27)

## 2021-10-30 LAB — HEPATIC FUNCTION PANEL
ALT: 24 U/L (ref 0–44)
AST: 20 U/L (ref 15–41)
Albumin: 4.1 g/dL (ref 3.5–5.0)
Alkaline Phosphatase: 69 U/L (ref 38–126)
Bilirubin, Direct: 0.1 mg/dL (ref 0.0–0.2)
Indirect Bilirubin: 1 mg/dL — ABNORMAL HIGH (ref 0.3–0.9)
Total Bilirubin: 1.1 mg/dL (ref 0.3–1.2)
Total Protein: 8.3 g/dL — ABNORMAL HIGH (ref 6.5–8.1)

## 2021-10-30 LAB — HEMOGLOBIN A1C
Hgb A1c MFr Bld: 8.7 % — ABNORMAL HIGH (ref 4.8–5.6)
Mean Plasma Glucose: 202.99 mg/dL

## 2021-10-30 LAB — RESP PANEL BY RT-PCR (FLU A&B, COVID) ARPGX2
Influenza A by PCR: NEGATIVE
Influenza B by PCR: NEGATIVE
SARS Coronavirus 2 by RT PCR: NEGATIVE

## 2021-10-30 LAB — GLUCOSE, CAPILLARY
Glucose-Capillary: 190 mg/dL — ABNORMAL HIGH (ref 70–99)
Glucose-Capillary: 157 mg/dL — ABNORMAL HIGH (ref 70–99)

## 2021-10-30 LAB — LACTIC ACID, PLASMA: Lactic Acid, Venous: 1.5 mmol/L (ref 0.5–1.9)

## 2021-10-30 MED ORDER — HYDROCODONE-ACETAMINOPHEN 5-325 MG PO TABS: 1.0000 | ORAL_TABLET | Freq: Four times a day (QID) | ORAL | Status: DC | PRN

## 2021-10-30 MED ORDER — METOPROLOL TARTRATE 25 MG PO TABS
25.0000 mg | ORAL_TABLET | Freq: Once | ORAL | Status: AC
Start: 2021-10-30 — End: 2021-10-30
  Administered 2021-10-30: 25 mg via ORAL
  Filled 2021-10-30: qty 1

## 2021-10-30 MED ORDER — ATORVASTATIN CALCIUM 40 MG PO TABS
40.0000 mg | ORAL_TABLET | Freq: Every evening | ORAL | Status: DC
  Administered 2021-10-30 – 2021-11-02 (×4): 40 mg via ORAL
  Filled 2021-10-30 (×5): qty 1

## 2021-10-30 MED ORDER — VANCOMYCIN HCL 2000 MG/400ML IV SOLN
2000.0000 mg | INTRAVENOUS | Status: DC
  Administered 2021-10-31: 2000 mg via INTRAVENOUS
  Filled 2021-10-30 (×3): qty 400

## 2021-10-30 MED ORDER — IOHEXOL 300 MG/ML  SOLN
100.0000 mL | Freq: Once | INTRAMUSCULAR | Status: AC | PRN
  Administered 2021-10-30: 75 mL via INTRAVENOUS

## 2021-10-30 MED ORDER — ONDANSETRON HCL 4 MG PO TABS
4.0000 mg | ORAL_TABLET | Freq: Four times a day (QID) | ORAL | Status: DC | PRN
  Administered 2021-11-01: 4 mg via ORAL
  Filled 2021-10-30: qty 1

## 2021-10-30 MED ORDER — SODIUM CHLORIDE 0.9 % IV SOLN
2.0000 g | Freq: Three times a day (TID) | INTRAVENOUS | Status: DC
  Administered 2021-10-30 – 2021-11-01 (×5): 2 g via INTRAVENOUS
  Filled 2021-10-30 (×5): qty 2

## 2021-10-30 MED ORDER — CLINDAMYCIN PHOSPHATE 600 MG/50ML IV SOLN
600.0000 mg | Freq: Once | INTRAVENOUS | Status: AC
  Administered 2021-10-30: 600 mg via INTRAVENOUS
  Filled 2021-10-30: qty 50

## 2021-10-30 MED ORDER — POLYETHYLENE GLYCOL 3350 17 G PO PACK: 17.0000 g | PACK | Freq: Every day | ORAL | Status: DC | PRN

## 2021-10-30 MED ORDER — INSULIN ASPART 100 UNIT/ML IJ SOLN
0.0000 [IU] | Freq: Three times a day (TID) | INTRAMUSCULAR | Status: DC
  Administered 2021-10-30 – 2021-11-01 (×5): 3 [IU] via SUBCUTANEOUS
  Administered 2021-11-01: 5 [IU] via SUBCUTANEOUS
  Administered 2021-11-01: 2 [IU] via SUBCUTANEOUS
  Administered 2021-11-02 (×2): 3 [IU] via SUBCUTANEOUS
  Administered 2021-11-02: 2 [IU] via SUBCUTANEOUS
  Administered 2021-11-03 (×2): 3 [IU] via SUBCUTANEOUS

## 2021-10-30 MED ORDER — VANCOMYCIN HCL 2000 MG/400ML IV SOLN
2000.0000 mg | Freq: Once | INTRAVENOUS | Status: AC
  Administered 2021-10-30: 2000 mg via INTRAVENOUS
  Filled 2021-10-30 (×2): qty 400

## 2021-10-30 MED ORDER — ENOXAPARIN SODIUM 40 MG/0.4ML IJ SOSY
40.0000 mg | PREFILLED_SYRINGE | INTRAMUSCULAR | Status: DC
  Administered 2021-10-30 – 2021-11-02 (×4): 40 mg via SUBCUTANEOUS
  Filled 2021-10-30 (×4): qty 0.4

## 2021-10-30 MED ORDER — ALBUTEROL SULFATE HFA 108 (90 BASE) MCG/ACT IN AERS: 2.0000 | INHALATION_SPRAY | RESPIRATORY_TRACT | Status: DC

## 2021-10-30 MED ORDER — ACETAMINOPHEN 325 MG PO TABS
650.0000 mg | ORAL_TABLET | Freq: Four times a day (QID) | ORAL | Status: DC | PRN
  Administered 2021-11-01: 650 mg via ORAL
  Filled 2021-10-30: qty 2

## 2021-10-30 MED ORDER — ONDANSETRON HCL 4 MG/2ML IJ SOLN
4.0000 mg | Freq: Once | INTRAMUSCULAR | Status: AC
Start: 2021-10-30 — End: 2021-10-30
  Administered 2021-10-30: 4 mg via INTRAVENOUS
  Filled 2021-10-30: qty 2

## 2021-10-30 MED ORDER — ONDANSETRON HCL 4 MG/2ML IJ SOLN
4.0000 mg | Freq: Four times a day (QID) | INTRAMUSCULAR | Status: DC | PRN
Start: 2021-10-30 — End: 2021-11-03
  Administered 2021-10-30 – 2021-10-31 (×3): 4 mg via INTRAVENOUS
  Filled 2021-10-30 (×3): qty 2

## 2021-10-30 MED ORDER — SODIUM CHLORIDE 0.9 % IV BOLUS
500.0000 mL | Freq: Once | INTRAVENOUS | Status: AC
Start: 2021-10-30 — End: 2021-10-30
  Administered 2021-10-30: 500 mL via INTRAVENOUS

## 2021-10-30 MED ORDER — SPIRONOLACTONE 25 MG PO TABS
12.5000 mg | ORAL_TABLET | Freq: Every day | ORAL | Status: DC
  Administered 2021-10-30 – 2021-11-01 (×3): 12.5 mg via ORAL
  Filled 2021-10-30 (×3): qty 1
  Filled 2021-10-30: qty 0.5
  Filled 2021-10-30: qty 1
  Filled 2021-10-30 (×5): qty 0.5

## 2021-10-30 MED ORDER — INSULIN ASPART 100 UNIT/ML IJ SOLN
0.0000 [IU] | Freq: Every day | INTRAMUSCULAR | Status: DC
  Administered 2021-11-02: 2 [IU] via SUBCUTANEOUS

## 2021-10-30 MED ORDER — SACUBITRIL-VALSARTAN 24-26 MG PO TABS
1.0000 | ORAL_TABLET | Freq: Two times a day (BID) | ORAL | Status: DC
  Administered 2021-10-30 – 2021-11-01 (×4): 1 via ORAL
  Filled 2021-10-30 (×4): qty 1

## 2021-10-30 MED ORDER — ALBUTEROL SULFATE (2.5 MG/3ML) 0.083% IN NEBU: 2.5000 mg | INHALATION_SOLUTION | Freq: Four times a day (QID) | RESPIRATORY_TRACT | Status: DC | PRN

## 2021-10-30 MED ORDER — METOPROLOL SUCCINATE ER 25 MG PO TB24
25.0000 mg | ORAL_TABLET | Freq: Every day | ORAL | Status: DC
  Filled 2021-10-30 (×4): qty 1

## 2021-10-30 MED ORDER — SODIUM CHLORIDE 0.9 % IV SOLN: INTRAVENOUS | Status: DC

## 2021-10-30 MED ORDER — ACETAMINOPHEN 650 MG RE SUPP: 650.0000 mg | Freq: Four times a day (QID) | RECTAL | Status: DC | PRN

## 2021-10-30 NOTE — Assessment & Plan Note (Addendum)
-  Stable and compensated.   ?-Last echo 2022 EF 40 to 45%.  Follows with Dr. Domenic Polite.  ?-Compliance with medications including Entresto and lasix. ?-Held Lasix initially but since BP is better can resume ?-Strict I's and O's and Daily Weights; Patient is +8.759 Liters since admission ?-Will hold Entresto, spironolactone and Metoprolol ?-C/w aspirin and atorvastatin ?-Continue to monitor for signs and symptoms of volume overload and he Has 1+ LE Edema so will resume Lasix  ?-Will stop IVF now ? ?

## 2021-10-30 NOTE — ED Notes (Signed)
Attempted to call report X 1. 

## 2021-10-30 NOTE — Consult Note (Signed)
Pharmacy Antibiotic Note ? ?Ricardo Zuniga is a 71 y.o. male admitted on 10/30/2021 with facial cellulitis.  Pharmacy has been consulted for cefepime & vanc dosing. ? ?Plan: ?Cefepime 2gms q8hrs ?Vanco 2gm LD followed by 2gm q24hrs. ? AUC=492.1 ? Cmax=41.3 ? Cmin=9.4 ?Regimen from AUC calculator (2nd most recommended) selected for greater area under the inhibitory curve. Levels to follow as indicated. ? ?Height: 5' 9.5" (176.5 cm) ?Weight: 83 kg (183 lb) ?IBW/kg (Calculated) : 71.85 ? ?Temp (24hrs), Avg:98.4 ?F (36.9 ?C), Min:98.3 ?F (36.8 ?C), Max:98.5 ?F (36.9 ?C) ? ?Recent Labs  ?Lab 10/30/21 ?1156 10/30/21 ?1326  ?WBC 12.2*  --   ?CREATININE 0.91  --   ?LATICACIDVEN  --  1.5  ?  ?Estimated Creatinine Clearance: 75.7 mL/min (by C-G formula based on SCr of 0.91 mg/dL).   ? ?Allergies  ?Allergen Reactions  ? Lisinopril Cough  ? ? ?Antimicrobials this admission: ?0322 Clindamycin '600mg'$  x1dose ?0322  Cefepime 2gm q8hr>> ?0322 Vanc 2gms LD, 2gms q24hrs>>  ? ?Microbiology results: ?51 Bcx X2: pending ? ?Thank you for allowing pharmacy to be a part of this patientRicardos care. ? ?Berta Minor, RPh ?10/30/2021 7:08 PM ? ?

## 2021-10-30 NOTE — Assessment & Plan Note (Addendum)
-  Stable and now BP is actually on the softer side ?-Initially had Resumed Home Entresto, spironolactone, metoprolol but will hold given his SOFT BP. ?-Currently holding his Lasix ?-Continue monitor blood pressures per protocol last blood pressure reading was 128/76 ?

## 2021-10-30 NOTE — ED Triage Notes (Signed)
Pt reports abscesses inside his upper lip and inside his nose, pt reports he has seen PCP x 2 and was referred to maxillofacial specialist and Rx'ed Augmentin x 5 days; reports new onset emesis and nausea this am, no relief from Zofran  ?

## 2021-10-30 NOTE — Assessment & Plan Note (Addendum)
-  Facial cellulitis with sepsis on admission as he was Tachycardic 118-123 had a Leukocytosis of 12.2.  Afebrile temperature 98.5.  Normal lactic acid 1.5.  Maxillo facial CT without evidence of drainable collection. ?-Failed outpatient antibiotics Augmentin ?-IV vancomycin and cefepime now stopped and changed to Doxycycline and will continue for 5 more days ?-IVF now stopped  ?-Follow-up blood culture results ?-Hydrocodone-acetaminophen 5/325 PRN ? ?

## 2021-10-30 NOTE — ED Provider Notes (Signed)
?[facility] ?Provider Note ? ? ?CSN: [id number] ?Arrival date & time: 10/30/21  1052 ? ?  ? ?History ? ?Chief Complaint  ?Patient presents with  ? Abscess  ? ? ?LESTON SCHUELLER is a 71 y.o. male. ? ?71 year old male presents with complaint of abscess to his left nostril/upper frenulum area.  Patient is feeling generally unwell, denies fevers, states he has had some chills with nausea and vomiting.  Reported abscess started in the left nostril about 5 days ago, went to his PCP who gave him an injection of antibiotics in the office and started him on Augmentin which he has been taking.  Patient felt like the abscess moved from the nostril to his upper lip area and frenulum, went back to PCP yesterday and was given an additional injection of antibiotics and referred to a maxillofacial specialist however does not have an appointment scheduled.  Patient began with nausea and vomiting this morning, has not been able to take any of his regular medications, limited relief with Zofran and presented to the emergency room.  He denies abdominal pain, chest pain, shortness of breath or palpitations.  Patient's wife provides majority of history, supplements that a few weeks ago patient was in this emergency room with bronchitis, was put on steroids, then had a colonoscopy a few days later.  States that he has not been feeling very well for several weeks now. ?Past medical history of leukemia, in remission, heart failure with EF of about 40-45% on last echo dated August 2022, peptic ulcer disease, DM. ? ? ?  ? ?Home Medications ?Prior to Admission medications   ?Medication Sig Start Date End Date Taking? Authorizing Provider  ?acetaminophen (TYLENOL) 650 MG CR tablet Take 1,300 mg by mouth every 8 (eight) hours as needed for pain.    [provider]  ?albuterol (VENTOLIN HFA) 108 (90 Base) MCG/ACT inhaler Inhale 2 puffs into the lungs See admin instructions. Inhale 2 puffs 4 times daily until 3/12  then 2 puffs every 6 hours as needed for shortness of breath    [provider]  ?amoxicillin-clavulanate (AUGMENTIN) 500-125 MG tablet Take 1 tablet by mouth 2 (two) times daily. 10/25/21   [provider]  ?aspirin EC 81 MG tablet Take 81 mg by mouth daily.    [provider]  ?atorvastatin (LIPITOR) 40 MG tablet Take 1 tablet by mouth once daily ?Patient taking differently: Take 40 mg by mouth every evening. 09/23/21   Satira Sark, MD  ?Cholecalciferol (VITAMIN D3 PO) Take 1 tablet by mouth in the morning.    [provider]  ?diazepam (VALIUM) 5 MG tablet Take 5 mg by mouth daily as needed. 10/17/21   [provider]  ?docusate sodium (COLACE) 100 MG capsule Take 100 mg by mouth at bedtime.    [provider]  ?Carollee Sires MG Take 1 tablet by mouth twice daily 06/03/21   Satira Sark, MD  ?furosemide (LASIX) 20 MG tablet TAKE 1 TABLET BY MOUTH ONCE DAILY AS DIRECTED ?Patient taking differently: Take 20 mg by mouth in the morning. 09/09/21   Satira Sark, MD  ?guaiFENesin (MUCINEX) 600 MG 12 hr tablet Take 600 mg by mouth daily.    [provider]  ?INVOKANA 300 MG TABS Take 300 mg by mouth in the morning. 12/05/13   [provider]  ?metoprolol succinate (TOPROL-XL) 25 MG 24 hr tablet Take 1 tablet (25 mg total) by mouth daily. ?Patient taking differently: Take  12.5 mg by mouth daily. 03/18/21   Verta Ellen., NP  ?mupirocin ointment (BACTROBAN) 2 % Apply 1 application. topically 2 (two) times daily. 10/25/21   [provider]  ?omeprazole (PRILOSEC) 20 MG capsule Take by mouth daily before breakfast.    [provider]  ?predniSONE (DELTASONE) 20 MG tablet Take 2 tablets (40 mg total) by mouth daily. ?Patient not taking: Reported on 10/18/2021 10/13/21   Noemi Chapel, MD  ?repaglinide (PRANDIN) 2 MG tablet Take 2 mg by mouth 3 (three) times daily before meals.  07/06/17   [provider]  ?sodium  chloride (OCEAN) 0.65 % nasal spray Place 1 spray into the nose as needed for congestion.    [provider]  ?spironolactone (ALDACTONE) 25 MG tablet Take 0.5 tablets (12.5 mg total) by mouth daily. 04/01/21 10/19/23  Satira Sark, MD  ?zinc gluconate 50 MG tablet Take 50 mg by mouth daily.    [provider]  ?   ? ?Allergies    ?Lisinopril   ? ?Review of Systems   ?Review of Systems ?Negative except as per HPI ?Physical Exam ?Updated Vital Signs ?BP 137/68   Pulse (!) 110   Temp 98.4 ?F (36.9 ?C) (Oral)   Resp 20   Ht 5' 9.5" (1.765 m)   Wt 83 kg   SpO2 99%   BMI 26.64 kg/m?  ?Physical Exam ?Vitals and nursing note reviewed.  ?Constitutional:   ?   General: He is not in acute distress. ?   Appearance: He is well-developed. He is not diaphoretic.  ?   Comments: Appears to feel unwell, actively vomiting  ?HENT:  ?   Head: Normocephalic and atraumatic.  ?   Comments: Mild to moderate swelling noted to the upper lip, does involve the upper frenulum which is tender to the touch.  There is no active drainage, no obviously drainable collection.  Small lesion noted to inferior left nostril area, no active drainage  ?   Nose: Nose normal.  ?   Mouth/Throat:  ?   Mouth: Mucous membranes are moist.  ?Eyes:  ?   Conjunctiva/sclera: Conjunctivae normal.  ?Cardiovascular:  ?   Rate and Rhythm: Tachycardia present. Rhythm irregular.  ?   Heart sounds: Normal heart sounds. No murmur heard. ?Pulmonary:  ?   Effort: Pulmonary effort is normal. No respiratory distress.  ?   Breath sounds: Normal breath sounds. No wheezing, rhonchi or rales.  ?Abdominal:  ?   Palpations: Abdomen is soft.  ?   Tenderness: There is no abdominal tenderness.  ?Musculoskeletal:  ?   Right lower leg: No edema.  ?   Left lower leg: No edema.  ?Skin: ?   General: Skin is warm and dry.  ?   Findings: No erythema or rash.  ?Neurological:  ?   Mental Status: He is alert and oriented to person, place, and time.  ?   Motor: No  weakness.  ?Psychiatric:     ?   Behavior: Behavior normal.  ? ? ?ED Results / Procedures / Treatments   ?Labs ?(all labs ordered are listed, but only abnormal results are displayed) ?Labs Reviewed  ?CBC WITH DIFFERENTIAL/PLATELET - Abnormal; Notable for the following components:  ?    Result Value  ? WBC 12.2 (*)   ? Neutro Abs 11.5 (*)   ? Lymphs Abs 0.2 (*)   ? All other components within normal limits  ?BASIC METABOLIC PANEL - Abnormal; Notable for the following  components:  ? CO2 21 (*)   ? Glucose, Bld 249 (*)   ? Anion gap 16 (*)   ? All other components within normal limits  ?BETA-HYDROXYBUTYRIC ACID - Abnormal; Notable for the following components:  ? Beta-Hydroxybutyric Acid 2.98 (*)   ? All other components within normal limits  ?HEPATIC FUNCTION PANEL - Abnormal; Notable for the following components:  ? Total Protein 8.3 (*)   ? Indirect Bilirubin 1.0 (*)   ? All other components within normal limits  ?GLUCOSE, CAPILLARY - Abnormal; Notable for the following components:  ? Glucose-Capillary 190 (*)   ? All other components within normal limits  ?CULTURE, BLOOD (ROUTINE X 2)  ?CULTURE, BLOOD (ROUTINE X 2)  ?RESP PANEL BY RT-PCR (FLU A&B, COVID) ARPGX2  ?LACTIC ACID, PLASMA  ?BASIC METABOLIC PANEL  ?CBC  ?HEMOGLOBIN A1C  ? ? ?EKG ?None ? ?Radiology ?DG Chest 1 View ? ?Result Date: 10/30/2021 ?CLINICAL DATA:  New onset nausea and vomiting. EXAM: CHEST  1 VIEW COMPARISON:  10/13/2021. FINDINGS: Trachea is midline. Heart size stable. Thoracic aorta is calcified. Lungs are clear. No pleural fluid. IMPRESSION: No acute findings. Electronically Signed   By: Lorin Picket M.D.   On: 10/30/2021 14:18  ? ?CT Maxillofacial W Contrast ? ?Result Date: 10/30/2021 ?CLINICAL DATA:  Maxillary/facial abscess affecting the upper lip and nose EXAM: CT MAXILLOFACIAL WITH CONTRAST TECHNIQUE: Multidetector CT imaging of the maxillofacial structures was performed with intravenous contrast. Multiplanar CT image reconstructions  were also generated. RADIATION DOSE REDUCTION: This exam was performed according to the departmental dose-optimization program which includes automated exposure control, adjustment of the mA and/or kV according to pati

## 2021-10-30 NOTE — Assessment & Plan Note (Addendum)
-  Random glucose 249.   ?-Home medications-Invokana.   ?-Anion gap borderline elevated at 16, serum bicarb borderline low at 21 Blood glucose 249.  Mild elevated BHA- 2.98 -> 4.93 -> 2.37 ?-SSI- M and gentle hydration for now ?-Continue to Trend ?-CBGs ranging from 129-187 ?-Hold home Invokana, rapaglinide and resume within advised at discharge.  Recommend continue to hold home Invokana and have PCP changes ?-Hemoglobin A1c was 8.7 ?-Continue monitor blood sugars per protocol and may need to initiate insulin drip ?

## 2021-10-30 NOTE — H&P (Signed)
? ? ?History and Physical  ? ? ?Ricardo Zuniga SWN:[id number] DOB: [birth year] DOA: 10/30/2021 ? ?PCP: Glenda Chroman, MD  ? ?Patient coming from: Home ? ?I have personally briefly reviewed patient's old medical records in [place] ? ?Chief Complaint: Facial pain and swelling ? ?HPI: Ricardo Zuniga is a 71 y.o. male with medical history significant for systolic and diastolic CHF, diabetes mellitus, hypertension, coronary artery disease, acute promyelocytic leukemia in remission ?Patient presented to the ED with complaints of swelling and pain that initially involved his left nostril, and has gradually spread downwards to involve the left side of his lip.  He saw his primary care provider for this, he was given an injection of an antibiotic (Unknown name) and prescribed Augmentin which she tells me he took.  Persistence of symptoms, he went back to his outpatient provider and was given a 2nd injection of an antibiotic ( Unknown name).  He was referred to maxillofacial specialist.  ?With onset of nausea and vomiting today, he presented to the ED.   ?He denies fever or chills. ? ?ED Course: Temperature 98.5.  Tachycardic heart rate 118 223.  Respiratory rate 18-24.  Blood pressure systolic mostly low 938H to 130s.  O2 sats 91-98 on room air.  WBC 12.2.  Lactic acid 1.5.  EKG showed sinus tachycardia with PVCs. ?Maxillofacial CT without evidence of drainable collection. ?IV clindamycin was given in the ED.  Hospitalist to admit. ? ?Review of Systems: As per HPI all other systems reviewed and negative. ? ?Past Medical History:  ?Diagnosis Date  ? Anal fissure   ? APML (acute promyelocytic leukemia) in remission ([place])   ? Completed treatment 04/2013  ? Coronary artery disease   ? Nonobstructive at cardiac catheterization 09/2018  ? Difficult intubation   ? Gastroesophageal reflux disease   ? History of kidney stones   ? Hypercholesteremia   ? Neuropathy   ? Nonischemic cardiomyopathy ([place])   ? a. EF 20% in 2015 -  thought to possibly be viral induced or chemotherapy induced from treatments in 2014 b. at 35-40% by echo in 05/2016   ? Peptic ulcer disease   ? Pneumonia   ? Type 2 diabetes mellitus ([place])   ? Ureteral colic   ? Wears glasses   ? Wears hearing aid in both ears   ? ? ?Past Surgical History:  ?Procedure Laterality Date  ? ANAL FISSURE REPAIR    ? APPENDECTOMY    ? BIOPSY  08/17/2020  ? Procedure: BIOPSY;  Surgeon: Harvel Quale, MD;  [facility];  Service: Gastroenterology;;  ? CARPAL TUNNEL RELEASE Left 10/16/2017  ? Procedure: LEFT CARPAL TUNNEL RELEASE;  Surgeon: Earlie Server, MD;  Location: [facility];  Service: Orthopedics;  Laterality: Left;  ? CARPAL TUNNEL RELEASE Right 11/25/2019  ? Procedure: CARPAL TUNNEL RELEASE;  Surgeon: Earlie Server, MD;  Location: [facility];  Service: Orthopedics;  Laterality: Right;  ? CATARACT EXTRACTION, BILATERAL    ? CERVICAL DISC ARTHROPLASTY N/A 01/05/2018  ? Procedure: Artificial Cervical Disc ReplacementCervical Three-Four, Cervical Four-Five;  Surgeon: Kristeen Miss, MD;  Location: [facility];  Service: Neurosurgery;  Laterality: N/A;  ? CERVICAL FUSION    ? CHOLECYSTECTOMY    ? COLONOSCOPY N/A 06/27/2015  ? Procedure: COLONOSCOPY;  Surgeon: Rogene Houston, MD;  [facility];  Service: Endoscopy;  Laterality: N/A;  730  ? COLONOSCOPY WITH PROPOFOL N/A 08/17/2020  ? Procedure: COLONOSCOPY WITH PROPOFOL;  Surgeon: Harvel Quale,  MD;  [facility];  Service: Gastroenterology;  Laterality: N/A;  11:15  ? COLONOSCOPY WITH PROPOFOL N/A 10/23/2021  ? Procedure: COLONOSCOPY WITH PROPOFOL;  Surgeon: Harvel Quale, MD;  [facility];  Service: Gastroenterology;  Laterality: N/A;  9:45am  ? ESOPHAGOGASTRODUODENOSCOPY (EGD) WITH PROPOFOL N/A 08/17/2020  ? Procedure: ESOPHAGOGASTRODUODENOSCOPY (EGD) WITH PROPOFOL;  Surgeon: Harvel Quale, MD;  [facility];  Service: Gastroenterology;  Laterality:  N/A;  ? KNEE SURGERY    ? x3  ? LEFT AND RIGHT HEART CATHETERIZATION WITH CORONARY ANGIOGRAM N/A 12/12/2013  ? Procedure: LEFT AND RIGHT HEART CATHETERIZATION WITH CORONARY ANGIOGRAM;  Surgeon: [name], MD;  Location: [facility];  Service: Cardiovascular;  Laterality: N/A;  ? LEFT HEART CATH AND CORONARY ANGIOGRAPHY N/A 09/17/2018  ? Procedure: LEFT HEART CATH AND CORONARY ANGIOGRAPHY;  Surgeon: Wellington Hampshire, MD;  Location: [facility];  Service: Cardiovascular;  Laterality: N/A;  ? LUMBAR FUSION    ? POLYPECTOMY  08/17/2020  ? Procedure: POLYPECTOMY;  Surgeon: Harvel Quale, MD;  [facility];  Service: Gastroenterology;;  ? POLYPECTOMY  10/23/2021  ? Procedure: POLYPECTOMY;  Surgeon: Harvel Quale, MD;  [facility];  Service: Gastroenterology;;  ? SHOULDER ACROMIOPLASTY Right 07/18/2015  ? Procedure: SHOULDER ACROMIOPLASTY;  Surgeon: Earlie Server, MD;  Location: [facility];  Service: Orthopedics;  Laterality: Right;  ? SHOULDER ARTHROSCOPY Right 07/18/2015  ? Procedure: Right shoulder arthroscopy with debridement;  Surgeon: Earlie Server, MD;  Location: [facility];  Service: Orthopedics;  Laterality: Right;  ? SHOULDER SURGERY    ? x3  ? TONSILLECTOMY    ? ? ? reports that he quit smoking about 35 years ago. His smoking use included cigarettes. He started smoking about 53 years ago. He has a 34.00 pack-year smoking history. He has never used smokeless tobacco. He reports that he does not drink alcohol and does not use drugs. ? ?Allergies  ?Allergen Reactions  ? Lisinopril Cough  ? ? ?Family History  ?Problem Relation Age of Onset  ? Colon cancer Other   ? ? ?Prior to Admission medications   ?Medication Sig Start Date End Date Taking? Authorizing Provider  ?acetaminophen (TYLENOL) 650 MG CR tablet Take 1,300 mg by mouth every 8 (eight) hours as needed for pain.    [provider]  ?albuterol (VENTOLIN HFA) 108 (90  Base) MCG/ACT inhaler Inhale 2 puffs into the lungs See admin instructions. Inhale 2 puffs 4 times daily until 3/12 then 2 puffs every 6 hours as needed for shortness of breath    [provider]  ?aspirin EC 81 MG tablet Take 81 mg by mouth daily.    [provider]  ?atorvastatin (LIPITOR) 40 MG tablet Take 1 tablet by mouth once daily ?Patient taking differently: Take 40 mg by mouth every evening. 09/23/21   Satira Sark, MD  ?Cholecalciferol (VITAMIN D3 PO) Take 1 tablet by mouth in the morning.    [provider]  ?docusate sodium (COLACE) 100 MG capsule Take 100 mg by mouth at bedtime.    [provider]  ?Carollee Sires MG Take 1 tablet by mouth twice daily 06/03/21   Satira Sark, MD  ?furosemide (LASIX) 20 MG tablet TAKE 1 TABLET BY MOUTH ONCE DAILY AS DIRECTED ?Patient taking differently: Take 20 mg by mouth in the morning. 09/09/21   Satira Sark, MD  ?guaiFENesin ([place]) 600 MG 12  hr tablet Take 600 mg by mouth daily.    [provider]  ?INVOKANA 300 MG TABS Take 300 mg by mouth in the morning. 12/05/13   [provider]  ?metoprolol succinate (TOPROL-XL) 25 MG 24 hr tablet Take 1 tablet (25 mg total) by mouth daily. ?Patient taking differently: Take 12.5 mg by mouth daily. 03/18/21   Verta Ellen., NP  ?omeprazole (PRILOSEC) 20 MG capsule Take by mouth daily before breakfast.    [provider]  ?predniSONE (DELTASONE) 20 MG tablet Take 2 tablets (40 mg total) by mouth daily. ?Patient not taking: Reported on 10/18/2021 10/13/21   Noemi Chapel, MD  ?repaglinide (PRANDIN) 2 MG tablet Take 2 mg by mouth 3 (three) times daily before meals.  07/06/17   [provider]  ?sodium chloride (OCEAN) 0.65 % nasal spray Place 1 spray into the nose as needed for congestion.    [provider]  ?spironolactone (ALDACTONE) 25 MG tablet Take 0.5 tablets (12.5 mg total) by mouth daily. 04/01/21 10/19/23  Satira Sark,  MD  ?zinc gluconate 50 MG tablet Take 50 mg by mouth daily.    [provider]  ? ? ?Physical Exam: ?Vitals:  ? 10/30/21 1400 10/30/21 1430 10/30/21 1500 10/30/21 1509  ?BP: 119/69 122/74 113/64 1

## 2021-10-31 DIAGNOSIS — I420 Dilated cardiomyopathy: Secondary | ICD-10-CM | POA: Diagnosis not present

## 2021-10-31 DIAGNOSIS — I10 Essential (primary) hypertension: Secondary | ICD-10-CM

## 2021-10-31 DIAGNOSIS — L03211 Cellulitis of face: Secondary | ICD-10-CM | POA: Diagnosis not present

## 2021-10-31 DIAGNOSIS — E872 Acidosis, unspecified: Secondary | ICD-10-CM

## 2021-10-31 DIAGNOSIS — R197 Diarrhea, unspecified: Secondary | ICD-10-CM | POA: Diagnosis not present

## 2021-10-31 DIAGNOSIS — E1110 Type 2 diabetes mellitus with ketoacidosis without coma: Secondary | ICD-10-CM

## 2021-10-31 DIAGNOSIS — E871 Hypo-osmolality and hyponatremia: Secondary | ICD-10-CM

## 2021-10-31 LAB — URINALYSIS, ROUTINE W REFLEX MICROSCOPIC
Bilirubin Urine: NEGATIVE
Glucose, UA: >=500 mg/dL — AB
Hgb urine dipstick: NEGATIVE
Ketones, ur: 80 mg/dL — AB
Leukocytes,Ua: NEGATIVE
Nitrite: NEGATIVE
Protein, ur: 30 mg/dL — AB
Specific Gravity, Urine: 1.031 — ABNORMAL HIGH (ref 1.005–1.030)
pH: 6 (ref 5.0–8.0)

## 2021-10-31 LAB — GLUCOSE, CAPILLARY
Glucose-Capillary: 178 mg/dL — ABNORMAL HIGH (ref 70–99)
Glucose-Capillary: 152 mg/dL — ABNORMAL HIGH (ref 70–99)
Glucose-Capillary: 154 mg/dL — ABNORMAL HIGH (ref 70–99)
Glucose-Capillary: 103 mg/dL — ABNORMAL HIGH (ref 70–99)

## 2021-10-31 LAB — CBC
HCT: 40.2 % (ref 39.0–52.0)
Hemoglobin: 13.3 g/dL (ref 13.0–17.0)
MCH: 31.2 pg (ref 26.0–34.0)
MCHC: 33.1 g/dL (ref 30.0–36.0)
MCV: 94.4 fL (ref 80.0–100.0)
Platelets: 275 10*3/uL (ref 150–400)
RBC: 4.26 MIL/uL (ref 4.22–5.81)
RDW: 13.6 % (ref 11.5–15.5)
WBC: 9.8 10*3/uL (ref 4.0–10.5)
nRBC: 0 % (ref 0.0–0.2)

## 2021-10-31 LAB — BASIC METABOLIC PANEL
Anion gap: 17 — ABNORMAL HIGH (ref 5–15)
Anion gap: 15 (ref 5–15)
BUN: 35 mg/dL — ABNORMAL HIGH (ref 8–23)
BUN: 32 mg/dL — ABNORMAL HIGH (ref 8–23)
CO2: 18 mmol/L — ABNORMAL LOW (ref 22–32)
CO2: 18 mmol/L — ABNORMAL LOW (ref 22–32)
Calcium: 8.1 mg/dL — ABNORMAL LOW (ref 8.9–10.3)
Calcium: 7.7 mg/dL — ABNORMAL LOW (ref 8.9–10.3)
Chloride: 99 mmol/L (ref 98–111)
Chloride: 101 mmol/L (ref 98–111)
Creatinine, Ser: 1.18 mg/dL (ref 0.61–1.24)
Creatinine, Ser: 0.97 mg/dL (ref 0.61–1.24)
GFR, Estimated: >60 mL/min (ref >60)
GFR, Estimated: >60 mL/min (ref >60)
Glucose, Bld: 183 mg/dL — ABNORMAL HIGH (ref 70–99)
Glucose, Bld: 168 mg/dL — ABNORMAL HIGH (ref 70–99)
Potassium: 3.7 mmol/L (ref 3.5–5.1)
Potassium: 3.6 mmol/L (ref 3.5–5.1)
Sodium: 134 mmol/L — ABNORMAL LOW (ref 135–145)
Sodium: 134 mmol/L — ABNORMAL LOW (ref 135–145)

## 2021-10-31 LAB — MAGNESIUM: Magnesium: 1.8 mg/dL (ref 1.7–2.4)

## 2021-10-31 LAB — HEPATIC FUNCTION PANEL
ALT: 25 U/L (ref 0–44)
AST: 20 U/L (ref 15–41)
Albumin: 3.1 g/dL — ABNORMAL LOW (ref 3.5–5.0)
Alkaline Phosphatase: 47 U/L (ref 38–126)
Bilirubin, Direct: <0.1 mg/dL (ref 0.0–0.2)
Total Bilirubin: 1.5 mg/dL — ABNORMAL HIGH (ref 0.3–1.2)
Total Protein: 6.4 g/dL — ABNORMAL LOW (ref 6.5–8.1)

## 2021-10-31 LAB — BETA-HYDROXYBUTYRIC ACID
Beta-Hydroxybutyric Acid: 4.34 mmol/L — ABNORMAL HIGH (ref 0.05–0.27)
Beta-Hydroxybutyric Acid: 3.83 mmol/L — ABNORMAL HIGH (ref 0.05–0.27)

## 2021-10-31 LAB — C DIFFICILE QUICK SCREEN W PCR REFLEX
C Diff antigen: NEGATIVE
C Diff interpretation: NOT DETECTED
C Diff toxin: NEGATIVE

## 2021-10-31 LAB — SEDIMENTATION RATE: Sed Rate: 41 mm/hr — ABNORMAL HIGH (ref 0–16)

## 2021-10-31 LAB — C-REACTIVE PROTEIN: CRP: 9.4 mg/dL — ABNORMAL HIGH (ref <1.0)

## 2021-10-31 MED ORDER — MAGNESIUM SULFATE 2 GM/50ML IV SOLN
2.0000 g | Freq: Once | INTRAVENOUS | Status: AC
  Administered 2021-10-31: 2 g via INTRAVENOUS
  Filled 2021-10-31: qty 50

## 2021-10-31 MED ORDER — SODIUM BICARBONATE 8.4 % IV SOLN
INTRAVENOUS | Status: DC
  Filled 2021-10-31 (×5): qty 1000

## 2021-10-31 MED ORDER — SODIUM BICARBONATE 8.4 % IV SOLN
INTRAVENOUS | Status: AC
  Filled 2021-10-31: qty 100

## 2021-10-31 MED ORDER — SODIUM CHLORIDE 0.9 % IV BOLUS
500.0000 mL | Freq: Once | INTRAVENOUS | Status: AC
  Administered 2021-10-31: 500 mL via INTRAVENOUS

## 2021-10-31 MED ORDER — SODIUM CHLORIDE 0.9 % IV SOLN: INTRAVENOUS | Status: DC

## 2021-10-31 MED ORDER — SACCHAROMYCES BOULARDII 250 MG PO CAPS
250.0000 mg | ORAL_CAPSULE | Freq: Two times a day (BID) | ORAL | Status: DC
  Administered 2021-10-31 – 2021-11-03 (×7): 250 mg via ORAL
  Filled 2021-10-31 (×7): qty 1

## 2021-10-31 MED ORDER — PROCHLORPERAZINE EDISYLATE 10 MG/2ML IJ SOLN: 10.0000 mg | Freq: Four times a day (QID) | INTRAMUSCULAR | Status: DC | PRN

## 2021-10-31 NOTE — Hospital Course (Addendum)
The patient is a 71 year old Caucasian male with past medical history significant for but not limited to chronic systolic and diastolic CHF, diabetes mellitus type 2, hypertension, history of CAD, history of acute promyelocytic leukemia in remission as well as other comorbidities who presented to the ED with complaints of swelling and pain that initially involved his left nostril and gradually spread downwards to the involved left side of his lip.  He saw his primary care provider for which she was given an injection of antibiotic which is unknown name but likely ceftriaxone and was then prescribed Augmentin which he took.  He had persistence of his symptoms and he went back to his outpatient provider and was given a second injection of an antibiotic of unknown name and then referred to maxillofacial specialist.  He had onset of nausea vomiting and now having diarrhea and presented to the ED.  On arrival to the ED he was tachycardic and had elevated respiratory rate and elevated WBC.  Maxillofacial CT was done and showed no evidence of a drainable collection.  He was given IV clindamycin in the ED and his facial cellulitis and facial pain and swelling has improved however his main complaint is now of "feeling bad" and significant diarrhea. ? ?Patient had a persistent acidosis and so we changed him to a sodium bicarbonate drip.  He continued to have significant diarrhea but this is improving; We are awaiting the GI pathogen panel.  We have given him judicious amount of fluid given his history of heart failure and he had to be given a few boluses yesterday. He now has 1+ LE Pedal Edema. ? ?His beta hydroxybutyrate acid had trended the wrong way and initially was trending down and went to 3.83 yesterday but trended up to 4.93 yesterday and after fluid resuscitation and trended back down to 2.37.  Patient is not as nauseous now we will continue supportive care and have stopped with gentle IV fluid hydration.  ? ?Patient  is COVID testing repeat was negative but his GI pathogen panel did come back positive for norovirus likely causing his symptoms.  He is much improved and feels close to his baseline.  PT OT evaluated and recommending no follow-up and he is stable for discharge at this time and will follow-up with his PCP. ?

## 2021-10-31 NOTE — Progress Notes (Signed)
?   10/31/21 5625  ?Assess: MEWS Score  ?BP (!) 100/45  ?Pulse Rate (!) 104  ?Assess: MEWS Score  ?MEWS Temp 0  ?MEWS Systolic 1  ?MEWS Pulse 1  ?MEWS RR 0  ?MEWS LOC 0  ?MEWS Score 2  ?MEWS Score Color Yellow  ?Assess: if the MEWS score is Yellow or Red  ?Were vital signs taken at a resting state? Yes  ?Focused Assessment No change from prior assessment  ?Early Detection of Sepsis Score *See Row Information* Low  ?MEWS guidelines implemented *See Row Information* Yes  ?Treat  ?MEWS Interventions Other (Comment) ?(withheld Metoprolol)  ?Pain Scale 0-10  ?Pain Score 0  ?Take Vital Signs  ?Increase Vital Sign Frequency  Yellow: Q 2hr X 2 then Q 4hr X 2, if remains yellow, continue Q 4hrs  ?Escalate  ?MEWS: Escalate Yellow: discuss with charge nurse/RN and consider discussing with provider and RRT  ?Notify: Charge Nurse/RN  ?Name of Charge Nurse/RN Notified [place]  ?Date Charge Nurse/RN Notified 10/31/21  ?Time Charge Nurse/RN Notified 0845  ?Document  ?Patient Outcome Stabilized after interventions  ?Progress note created (see row info) Yes  ? ? ?

## 2021-10-31 NOTE — Assessment & Plan Note (Addendum)
-   Patient's T. bili trending upwards and went from 1.1 is now 1.5 -> 1.4 -> 1.2 x2 and has now normalized at 0.5 ?-IV fluid hydration stopped ?-Continue to monitor and trend and repeat CMP in a.m. ?

## 2021-10-31 NOTE — TOC Progression Note (Signed)
?  Transition of Care (TOC) Screening Note ? ? ?Patient Details  ?Name: Ricardo Zuniga ?Date of Birth: 09/13/1950 ? ? ?Transition of Care (TOC) CM/SW Contact:    ?Shade Flood, LCSW ?Phone Number: ?10/31/2021, 10:40 AM ? ? ? ?Transition of Care Department [facility]) has reviewed patient and no TOC needs have been identified at this time. We will continue to monitor patient advancement through interdisciplinary progression rounds. If new patient transition needs arise, please place a TOC consult. ? ? ?

## 2021-10-31 NOTE — Assessment & Plan Note (Addendum)
-  Mild and Improved. Na+ is now 140 ?-IVF now stopped ?-Repeat CMP in a.m. ? ?

## 2021-10-31 NOTE — Assessment & Plan Note (Addendum)
-  Patient met sepsis criteria as patient was tachycardic with a heart rate above 100 and a respiratory rate above 20 as well as a leukocytosis and a source of infection of his facial cellulitis but likely had concomitant norovirus causing majority of his symptoms ?-Patient has failed outpatient antibiotics with Augmentin and was given IV clindamycin in the ED ?-Was on IV vancomycin and IV cefepime but will discontinue now and he was transitioned doxycycline ?-Has been seen by maxillofacial specialist ?-Given his history of congestive dilated nonischemic cardiomyopathy with to be judicious with his fluids and given him to 500 mL boluses today and then started him on a sodium bicarbonate drip which we will continue at 75 MLS per hour. ?-Blood cultures x2 have been obtained and showed no growth to date at 3 days ?-Lactic acid level was 1.5 on admission  ?-WBC was 12.2 is now improved to 4.4 ?-CT Maxillofacial done and showed "Nonspecific soft tissue swelling of the upper lip and base of the nose. No evidence of a drainable low-density collection by CT. Mild mucosal thickening of the maxillary sinuses with a small amount of layering fluid on the right." ?-We will consolidate his antibiotics and change him to IV doxycycline 11/01/21 and will change to po and complete 5 more days for discharge ?-IVF now to stop.  ?-Will order PT/OT to further evaluate and treat and they are recommending a follow-up ?-Sepsis physiology has since resolved ?? ? ?

## 2021-10-31 NOTE — Assessment & Plan Note (Addendum)
-   Had a metabolic acidosis on admission with a CO2 of 21, anion gap of 16, and a chloride level of 99; now it had worsened but is now improving as patient's CO2 is 28, anion gap is 7, chloride level is 105.  ?-Patient did have elevated beta hydroxybutyrate acid on admission of 2.98 and trended up to 4.34 -> 3.83 -> 4.93 -> 2.37 ?-Fortunately lactic acid level is not elevated and was 1.5 ?-We will check a urinalysis to evaluate for ketones and he did have 80 ?-IVF has been changed to Sodium Bicarbonate 75 mL/hr but now stopped ?-Continue to monitor trend and repeat CMP within 1 week ?

## 2021-10-31 NOTE — Assessment & Plan Note (Addendum)
-  Likely SGLT2 inhibitor mediated as well as in the setting of significant diarrhea ?-He has beta hydroxybutyrate acid was worsening and went from 2.98-> 4.34 -> 4.93 -> 2.37 ?-IVF now stopped  ?-Holding his Invokana ?-Has a mild metabolic acidosis which is worsened from admission and his CO2 is now 18, anion gap is 17, chloride level is 99 ?-Blood sugars have been relatively well controlled ?-CBGs ranging from 129-187 so no indication for insulin drip at this time ?-If continues to worsen may consider insulin drip ?-For now we will keep him on a clear liquid diet given his diarrhea ?-WBC is improved from 12.2 and is now 4.4 ?-Continue to monitor CBGs and continue with insulin regimen as delineated ?-Of note he did have some ketones on his urinalysis ?-Will Consider getting a VBG but will not repeat his beta hydroxybutyrate acid this afternoon given that he is improving  ? ? ?

## 2021-10-31 NOTE — Progress Notes (Signed)
?PROGRESS NOTE ? ? ? ?Ricardo Zuniga  HDQ:[id number] DOB: June 08, 1951 DOA: 10/30/2021 ?PCP: Glenda Chroman, MD  ? ?Brief Narrative:  ?The patient is a 71 year old Caucasian male with past medical history significant for but not limited to chronic systolic and diastolic CHF, diabetes mellitus type 2, hypertension, history of CAD, history of acute promyelocytic leukemia in remission as well as other comorbidities who presented to the ED with complaints of swelling and pain that initially involved his left nostril and gradually spread downwards to the involved left side of his lip.  He saw his primary care provider for which she was given an injection of antibiotic which is unknown name but likely ceftriaxone and was then prescribed Augmentin which he took.  He had persistence of his symptoms and he went back to his outpatient provider and was given a second injection of an antibiotic of unknown name and then referred to maxillofacial specialist.  He had onset of nausea vomiting and now having diarrhea and presented to the ED.  On arrival to the ED he was tachycardic and had elevated respiratory rate and elevated WBC.  Maxillofacial CT was done and showed no evidence of a drainable collection.  He was given IV clindamycin in the ED and his facial cellulitis and facial pain and swelling has improved however his main complaint is now of "feeling bad" and significant diarrhea.  ? ? ?Assessment and Plan: ?* Facial cellulitis ?Facial cellulitis with sepsis.  Tachycardic 118-123.  Leukocytosis of 12.2.  Afebrile temperature 98.5.  Normal lactic acid 1.5.  Maxillo facial CT without evidence of drainable collection. ?-Failed outpatient antibiotics Augmentin ?-IV vancomycin and cefepime ?- 500 bolu sgiven, continue gentle fluids N/s 75cc/hr x 15hrs ?-Follow-up blood culture results ?-Hydrocodone-acetaminophen 5/325 PRN ? ? ?Sepsis ([place]) ?- Patient met sepsis criteria as patient was tachycardic with a heart rate above 100 and a  respiratory rate above 20 as well as a leukocytosis and a source of infection ?-Patient has failed outpatient antibiotics with Augmentin and was given IV clindamycin in the ED ?-Currently now on IV vancomycin and IV cefepime ?-Has been seen by maxillofacial specialist ?-Given his history of congestive dilated nonischemic cardiomyopathy he was only given a 500 mL bolus and then started on gentle fluids with normal saline at 75 MLS per hour for 15 hours ?-Blood cultures x2 have been obtained ?-Lactic acid level was 1.5 on admission  ?-WBC was 12.2 is now 9.8 ?-CT Maxillofacial done and showed "Nonspecific soft tissue swelling of the upper lip and base of the nose. No evidence of a drainable low-density collection by CT. Mild mucosal thickening of the maxillary sinuses with a small amount of layering fluid on the right." ?  ? ? ?Diarrhea ?- Could be contributing to his dehydration and ketosis ?-Check for infectious diarrhea and checking C. difficile which was negative and a GI pathogen panel which is pending ?-Patient is afebrile and has no leukocytosis now ?-ESR is elevated ?-Of note patient has been recently exposed to COVID-19 disease and his wife tested positive for COVID yesterday but he has no respiratory symptoms and his chest x-ray was clear ?-Question if this diarrhea is related to antibiotics or if it is related to COVID-19 disease or some sort of infection ?-I have added Florastor ?-Continue antibiotics for now and de-escalate ? ?Hyperbilirubinemia ?- Patient's T. bili trending upwards and went from 1.1 is now 1.5 ?-Continue IV fluid hydration ?-Continue to monitor and trend and repeat CMP in a.m. ? ?Hyponatremia ?-  Mild at 134.  Patient's sodium was 136 yesterday ?-Continue IV fluid hydration with normal saline at 75 mils per hour ?-Repeat CMP in a.m. ? ? ?Diabetic ketoacidosis without coma associated with type 2 diabetes mellitus ([place]) ?-Likely SGLT2 inhibitor mediated as well as in the setting of  significant diarrhea ?-He has beta hydroxybutyrate acid is worsening and went from 2.98 and is now 4.34 ?-Given of 500 mL bolus again this morning we will continue IV fluid hydration today at 75 MLS per hour given his significant heart history ?-Holding his Invokana ?-Has a mild metabolic acidosis which is worsened from admission and his CO2 is now 18, anion gap is 17, chloride level is 99 ?-Blood sugars have been relatively well controlled ?-CBGs ranging from 152-178 so no indication for insulin drip at this time ?-If continues to worsen may consider insulin drip ?-For now we will keep him on a clear liquid diet given his diarrhea ?-WBC is improved from 12.2 and is now 9.8 ?-Continue to monitor CBGs and continue with insulin regimen as delineated ?-Of note he did have some ketones on his urinalysis ?-Consider getting a VBG and will repeat his beta hydroxybutyrate acid this afternoon ? ? ? ?Metabolic acidosis ?- Had a metabolic acidosis on admission with a CO2 of 21, anion gap of 16, and a chloride level of 99; now patient's CO2 is 18, anion gap is 17, chloride level is 99.  ?-Patient did have elevated beta hydroxybutyrate acid on admission of 2.98 and it was not rechecked so we will recheck now ?-Fortunately lactic acid level is not elevated and was 1.5 ?-We will check a urinalysis to evaluate for ketones ?-Continue IV fluid hydration as above with normal saline at 75 MLS per hour for 15 hours total and may initiate sodium bicarbonate tablets ?-Continue to monitor trend and repeat CMP in the a.m. ? ?DM type 2 (diabetes mellitus, type 2) ([place]) ?-Random glucose 249.   ?-Home medications-Invokana.   ?-Anion gap borderline elevated at 16, serum bicarb borderline low at 21 Blood glucose 249.  Mild elevated BHA- 2.98. ?-SSI- M and gentle hydration for now ?-Repeat labs in the morning and will repeat beta hydroxybutyrate acid ?-Hold home Invokana, rapaglinide ?-Hemoglobin A1c was 8.7 ?-Continue monitor blood sugars per  protocol and may need to initiate insulin drip ? ?Congestive dilated cardiomyopathy- EF 20% at cath 12/13/13 ?-Stable and compensated.   ?-Last echo 2022 EF 40 to 45%.  Follows with Dr. Domenic Polite.  ?-Compliance with medications including Entresto and lasix. ?-Hold Lasix ?-Resume Entresto, spironolactone, aspirin and atorvastatin ?-Continue to monitor for signs and symptoms of volume overload as he is getting IV fluid resuscitation ? ? ?Essential hypertension ?-Stable and now BP is actually on the softer side ?-Resumed Home Entresto, spironolactone, metoprolol. ?-Currently holding his Lasix ?-Continue monitor blood pressures per protocol last blood pressure reading was 100/45 ? ? ?DVT prophylaxis: enoxaparin (LOVENOX) injection 40 mg Start: 10/30/21 2200 ? ?  Code Status: Full Code ?Family Communication: No family present at bedside  ? ?Disposition Plan:  ?Level of care: Telemetry ?Status is: Inpatient ?Remains inpatient appropriate because: Continues to have Diarrhea and feels bad. Facial Cellulitis is improving  ?  ?Consultants:  ?None ? ?Procedures:  ?None ? ?Antimicrobials:  ?Anti-infectives (From admission, onward)  ? ? Start     Dose/Rate Route Frequency Ordered Stop  ? 10/31/21 1800  vancomycin (VANCOREADY) IVPB 2000 mg/400 mL       ? 2,000 mg ?200 mL/hr over 120 Minutes Intravenous  Every 24 hours 10/30/21 1902    ? 10/30/21 2000  ceFEPIme (MAXIPIME) 2 g in sodium chloride 0.9 % 100 mL IVPB       ? 2 g ?200 mL/hr over 30 Minutes Intravenous Every 8 hours 10/30/21 1856    ? 10/30/21 2000  vancomycin (VANCOREADY) IVPB 2000 mg/400 mL       ? 2,000 mg ?200 mL/hr over 120 Minutes Intravenous  Once 10/30/21 1902 10/30/21 2336  ? 10/30/21 1515  clindamycin (CLEOCIN) IVPB 600 mg       ? 600 mg ?100 mL/hr over 30 Minutes Intravenous  Once 10/30/21 1503 10/30/21 1539  ? ?  ?  ?Subjective: ?Seen and examined at bedside and he was feeling weak and tired and states he is having quite a bit of diarrhea.  States that his face  and swelling is improved but his main complaint is fatigue and just "feeling bad".  No nausea or vomiting and wants to rest in the bed.  No other concerns or points this time. ? ?Objective: ?Vitals:  ? 10/31/21 08

## 2021-10-31 NOTE — Progress Notes (Signed)
Pt continues with low b/p throughout this shift. MD notifed. 500 mL NS bolus administered. Pt stable, laying in bed at this time, continues with intermittent diarrhea pending stool cx, and c-diff. Pt states "I just feel bad".  ?

## 2021-10-31 NOTE — Assessment & Plan Note (Addendum)
-  Could be contributing to his dehydration and ketosis; and he was found to have Norovirus ?-Check for infectious diarrhea and checking C. difficile which was negative and a GI pathogen panel which is + for NOROVIRUS ?-Patient is afebrile and has no leukocytosis now ?-ESR is elevated and CRP was 9 ?-Of note patient has been recently exposed to COVID-19 disease and his wife tested positive for COVID yesterday but he has no respiratory symptoms and his chest x-ray was clear ?-Question if this diarrhea is related to antibiotics or if it is related to COVID-19 disease or some sort of infection; Repeat COVID Test currently  ?-I have added Florastor and will continue for now ?-Continue antibiotics for now and de-escalated to just Doxy ?-PT/OT Evaluation Negative  ?-Follow in the outpatient setting and Diarrhea has improved  ?

## 2021-11-01 DIAGNOSIS — L03211 Cellulitis of face: Secondary | ICD-10-CM | POA: Diagnosis not present

## 2021-11-01 DIAGNOSIS — I420 Dilated cardiomyopathy: Secondary | ICD-10-CM | POA: Diagnosis not present

## 2021-11-01 DIAGNOSIS — E1110 Type 2 diabetes mellitus with ketoacidosis without coma: Secondary | ICD-10-CM | POA: Diagnosis not present

## 2021-11-01 DIAGNOSIS — R197 Diarrhea, unspecified: Secondary | ICD-10-CM | POA: Diagnosis not present

## 2021-11-01 LAB — BETA-HYDROXYBUTYRIC ACID
Beta-Hydroxybutyric Acid: 4.93 mmol/L — ABNORMAL HIGH (ref 0.05–0.27)
Beta-Hydroxybutyric Acid: 2.37 mmol/L — ABNORMAL HIGH (ref 0.05–0.27)

## 2021-11-01 LAB — GLUCOSE, CAPILLARY
Glucose-Capillary: 178 mg/dL — ABNORMAL HIGH (ref 70–99)
Glucose-Capillary: 214 mg/dL — ABNORMAL HIGH (ref 70–99)
Glucose-Capillary: 129 mg/dL — ABNORMAL HIGH (ref 70–99)
Glucose-Capillary: 177 mg/dL — ABNORMAL HIGH (ref 70–99)

## 2021-11-01 LAB — COMPREHENSIVE METABOLIC PANEL
ALT: 21 U/L (ref 0–44)
ALT: 20 U/L (ref 0–44)
AST: 17 U/L (ref 15–41)
AST: 15 U/L (ref 15–41)
Albumin: 2.9 g/dL — ABNORMAL LOW (ref 3.5–5.0)
Albumin: 2.7 g/dL — ABNORMAL LOW (ref 3.5–5.0)
Alkaline Phosphatase: 46 U/L (ref 38–126)
Alkaline Phosphatase: 45 U/L (ref 38–126)
Anion gap: 15 (ref 5–15)
Anion gap: 11 (ref 5–15)
BUN: 19 mg/dL (ref 8–23)
BUN: 14 mg/dL (ref 8–23)
CO2: 16 mmol/L — ABNORMAL LOW (ref 22–32)
CO2: 21 mmol/L — ABNORMAL LOW (ref 22–32)
Calcium: 7.8 mg/dL — ABNORMAL LOW (ref 8.9–10.3)
Calcium: 7.6 mg/dL — ABNORMAL LOW (ref 8.9–10.3)
Chloride: 104 mmol/L (ref 98–111)
Chloride: 103 mmol/L (ref 98–111)
Creatinine, Ser: 0.79 mg/dL (ref 0.61–1.24)
Creatinine, Ser: 0.72 mg/dL (ref 0.61–1.24)
GFR, Estimated: >60 mL/min (ref >60)
GFR, Estimated: >60 mL/min (ref >60)
Glucose, Bld: 140 mg/dL — ABNORMAL HIGH (ref 70–99)
Glucose, Bld: 173 mg/dL — ABNORMAL HIGH (ref 70–99)
Potassium: 3.7 mmol/L (ref 3.5–5.1)
Potassium: 3.5 mmol/L (ref 3.5–5.1)
Sodium: 135 mmol/L (ref 135–145)
Sodium: 135 mmol/L (ref 135–145)
Total Bilirubin: 1.4 mg/dL — ABNORMAL HIGH (ref 0.3–1.2)
Total Bilirubin: 1.2 mg/dL (ref 0.3–1.2)
Total Protein: 5.9 g/dL — ABNORMAL LOW (ref 6.5–8.1)
Total Protein: 5.6 g/dL — ABNORMAL LOW (ref 6.5–8.1)

## 2021-11-01 LAB — CBC WITH DIFFERENTIAL/PLATELET
Abs Immature Granulocytes: 0.06 10*3/uL (ref 0.00–0.07)
Basophils Absolute: 0 10*3/uL (ref 0.0–0.1)
Basophils Relative: 0 %
Eosinophils Absolute: 0.1 10*3/uL (ref 0.0–0.5)
Eosinophils Relative: 1 %
HCT: 36 % — ABNORMAL LOW (ref 39.0–52.0)
Hemoglobin: 12.2 g/dL — ABNORMAL LOW (ref 13.0–17.0)
Immature Granulocytes: 1 %
Lymphocytes Relative: 9 %
Lymphs Abs: 0.8 10*3/uL (ref 0.7–4.0)
MCH: 32.7 pg (ref 26.0–34.0)
MCHC: 33.9 g/dL (ref 30.0–36.0)
MCV: 96.5 fL (ref 80.0–100.0)
Monocytes Absolute: 0.7 10*3/uL (ref 0.1–1.0)
Monocytes Relative: 8 %
Neutro Abs: 7.5 10*3/uL (ref 1.7–7.7)
Neutrophils Relative %: 81 %
Platelets: 227 10*3/uL (ref 150–400)
RBC: 3.73 MIL/uL — ABNORMAL LOW (ref 4.22–5.81)
RDW: 14.1 % (ref 11.5–15.5)
WBC: 9.2 10*3/uL (ref 4.0–10.5)
nRBC: 0 % (ref 0.0–0.2)

## 2021-11-01 LAB — MAGNESIUM: Magnesium: 2.5 mg/dL — ABNORMAL HIGH (ref 1.7–2.4)

## 2021-11-01 LAB — PHOSPHORUS: Phosphorus: 1.4 mg/dL — ABNORMAL LOW (ref 2.5–4.6)

## 2021-11-01 MED ORDER — SODIUM CHLORIDE 0.9 % IV BOLUS
500.0000 mL | Freq: Once | INTRAVENOUS | Status: AC
  Administered 2021-11-01: 500 mL via INTRAVENOUS

## 2021-11-01 MED ORDER — SODIUM CHLORIDE 0.9 % IV SOLN
100.0000 mg | Freq: Two times a day (BID) | INTRAVENOUS | Status: DC
  Administered 2021-11-01 – 2021-11-03 (×4): 100 mg via INTRAVENOUS
  Filled 2021-11-01 (×6): qty 100

## 2021-11-01 MED ORDER — POTASSIUM PHOSPHATES 15 MMOLE/5ML IV SOLN
30.0000 mmol | Freq: Once | INTRAVENOUS | Status: AC
  Administered 2021-11-01: 30 mmol via INTRAVENOUS
  Filled 2021-11-01: qty 10

## 2021-11-01 NOTE — Progress Notes (Addendum)
?PROGRESS NOTE ? ? ? ?LES LONGMORE  WIO:[id number] DOB: 07/27/1951 DOA: 10/30/2021 ?PCP: Glenda Chroman, MD  ? ?Brief Narrative:  ?The patient is a 71 year old Caucasian male with past medical history significant for but not limited to chronic systolic and diastolic CHF, diabetes mellitus type 2, hypertension, history of CAD, history of acute promyelocytic leukemia in remission as well as other comorbidities who presented to the ED with complaints of swelling and pain that initially involved his left nostril and gradually spread downwards to the involved left side of his lip.  He saw his primary care provider for which she was given an injection of antibiotic which is unknown name but likely ceftriaxone and was then prescribed Augmentin which he took.  He had persistence of his symptoms and he went back to his outpatient provider and was given a second injection of an antibiotic of unknown name and then referred to maxillofacial specialist.  He had onset of nausea vomiting and now having diarrhea and presented to the ED.  On arrival to the ED he was tachycardic and had elevated respiratory rate and elevated WBC.  Maxillofacial CT was done and showed no evidence of a drainable collection.  He was given IV clindamycin in the ED and his facial cellulitis and facial pain and swelling has improved however his main complaint is now of "feeling bad" and significant diarrhea. ? ?Patient has a persistent acidosis and so we will change him to a sodium bicarbonate drip.  He continues to have significant diarrhea so are awaiting the GI pathogen panel.  We have given him judicious amount of fluid given his history of heart failure and he had to be given a few boluses today.  His beta hydroxybutyrate acid has trended the wrong way and initially was trending down and went to 3.83 yesterday but trended up to 4.93 today.  Patient continues to be nauseous.  We will need to continue supportive care and will continue gentle fluid  hydration.  Repeat blood work this afternoon is pending  ? ? ?Assessment and Plan: ?* Facial cellulitis ?-Facial cellulitis with sepsis.  Tachycardic 118-123.  Leukocytosis of 12.2.  Afebrile temperature 98.5.  Normal lactic acid 1.5.  Maxillo facial CT without evidence of drainable collection. ?-Failed outpatient antibiotics Augmentin ?-IV vancomycin and cefepime now stopped and changed to po Doxy ?- 500 bolus given, continue gentle fluids N/s 75cc/hr x 15hrs ?-Follow-up blood culture results ?-Hydrocodone-acetaminophen 5/325 PRN ? ? ?Sepsis ([place]) ?- Patient met sepsis criteria as patient was tachycardic with a heart rate above 100 and a respiratory rate above 20 as well as a leukocytosis and a source of infection ?-Patient has failed outpatient antibiotics with Augmentin and was given IV clindamycin in the ED ?-Was on IV vancomycin and IV cefepime but will discontinue now ?-Has been seen by maxillofacial specialist ?-Given his history of congestive dilated nonischemic cardiomyopathy with to be judicious with his fluids and given him to 500 mL boluses today and then started him on a sodium bicarbonate drip which we will continue at 75 MLS per hour. ?-Blood cultures x2 have been obtained and showed no growth to date at 2 days ?-Lactic acid level was 1.5 on admission  ?-WBC was 12.2 is now improved to 9.2 ?-CT Maxillofacial done and showed "Nonspecific soft tissue swelling of the upper lip and base of the nose. No evidence of a drainable low-density collection by CT. Mild mucosal thickening of the maxillary sinuses with a small amount of layering fluid on  the right." ?-We will consolidate his antibiotics and change him to IV doxycycline today ?-We are currently giving him gentle IV fluid hydration as delineated ?  ? ? ?Diarrhea ?-Could be contributing to his dehydration and ketosis ?-Check for infectious diarrhea and checking C. difficile which was negative and a GI pathogen panel which is pending STILL so will  re-order  ?-Patient is afebrile and has no leukocytosis now ?-ESR is elevated and CRP was 9 ?-Of note patient has been recently exposed to COVID-19 disease and his wife tested positive for COVID yesterday but he has no respiratory symptoms and his chest x-ray was clear ?-Question if this diarrhea is related to antibiotics or if it is related to COVID-19 disease or some sort of infection ?-I have added Florastor ?-Continue antibiotics for now and de-escalated to just Doxy ? ?Hyperbilirubinemia ?- Patient's T. bili trending upwards and went from 1.1 is now 1.5 -> 1.4 -> 1.2 ?-Continue IV fluid hydration ?-Continue to monitor and trend and repeat CMP in a.m. ? ?Hyponatremia ?-Mild and Improved. Na+ is now 135 ?-Continued IV fluid hydration with normal saline at 75 mils per hour but have changed to Sodium Bicarbonate Infusion ?-Repeat CMP in a.m. ? ? ?Diabetic ketoacidosis without coma associated with type 2 diabetes mellitus ([place]) ?-Likely SGLT2 inhibitor mediated as well as in the setting of significant diarrhea ?-He has beta hydroxybutyrate acid is worsening and went from 2.98 and is now 4.34 yesterday and today is 4.93 ?-He was given another 2 500 mL boluses again this morning and has fluid has been changed from normal saline to sodium bicarbonate at 75 MLS per hour ?-Holding his Invokana ?-Has a mild metabolic acidosis which is worsened from admission and his CO2 is now 18, anion gap is 17, chloride level is 99 ?-Blood sugars have been relatively well controlled ?-CBGs ranging from 152-178 so no indication for insulin drip at this time ?-If continues to worsen may consider insulin drip ?-For now we will keep him on a clear liquid diet given his diarrhea ?-WBC is improved from 12.2 and is now 9.2 ?-Continue to monitor CBGs and continue with insulin regimen as delineated ?-Of note he did have some ketones on his urinalysis ?-Consider getting a VBG and will repeat his beta hydroxybutyrate acid this  afternoon ? ? ? ?Metabolic acidosis ?- Had a metabolic acidosis on admission with a CO2 of 21, anion gap of 16, and a chloride level of 99; now it had worsened but is now improving as patient's CO2 is 21, anion gap is 11, chloride level is 103.  ?-Patient did have elevated beta hydroxybutyrate acid on admission of 2.98 and trended up to 4.34 -> 3.83 -> 4.93 ?-Fortunately lactic acid level is not elevated and was 1.5 ?-We will check a urinalysis to evaluate for ketones and he did have 80 ?-IVF has been changed to Sodium Bicarbonate 75 mL/hr ?-Continue to monitor trend and repeat CMP in the a.m. ? ?DM type 2 (diabetes mellitus, type 2) ([place]) ?-Random glucose 249.   ?-Home medications-Invokana.   ?-Anion gap borderline elevated at 16, serum bicarb borderline low at 21 Blood glucose 249.  Mild elevated BHA- 2.98 -> 4.93 ?-SSI- M and gentle hydration for now ?-Continue to Trend ?-CBGs ranging from 103-214 ?-Hold home Invokana, rapaglinide ?-Hemoglobin A1c was 8.7 ?-Continue monitor blood sugars per protocol and may need to initiate insulin drip ? ?Congestive dilated cardiomyopathy- EF 20% at cath 12/13/13 ?-Stable and compensated.   ?-Last echo 2022 EF 40  to 45%.  Follows with Dr. McDowell.  ?-Compliance with medications including Entresto and lasix. ?-Hold Lasix ?-Strict I's and O's and Daily Weights; Patient is +6.094 Liters since admission ?-Will hold Entresto, spironolactone and Metoprolol ?-C/w aspirin and atorvastatin ?-Continue to monitor for signs and symptoms of volume overload as he is getting IV fluid resuscitation ? ? ?Essential hypertension ?-Stable and now BP is actually on the softer side ?-Initially had Resumed Home Entresto, spironolactone, metoprolol but will hold given his SOFT BP. ?-Currently holding his Lasix ?-Continue monitor blood pressures per protocol last blood pressure reading was 85/54 ? ?DVT prophylaxis: enoxaparin (LOVENOX) injection 40 mg Start: 10/30/21 2200 ? ?  Code Status: Full  Code ?Family Communication: Discussed with the Wife Vanessa over the telephone ? ?Disposition Plan:  ?Level of care: Telemetry ?Status is: Inpatient ?Remains inpatient appropriate because: Continues to be nauseous and still have a quite

## 2021-11-01 NOTE — Care Management Important Message (Signed)
Important Message ? ?Patient Details  ?Name: Ricardo Zuniga ?MRN: [id number] ?Date of Birth: 08-09-1951 ? ? ?Medicare Important Message Given:  Yes (reviewed letter telephonically at [phone]) ? ? ? ? ?[name] ?11/01/2021, 4:56 PM ?

## 2021-11-01 NOTE — TOC Initial Note (Signed)
Transition of Care (TOC) - Initial/Assessment Note  ? ? ?Patient Details  ?Name: Ricardo Zuniga ?MRN: [id number] ?Date of Birth: June 05, 1951 ? ?Transition of Care (TOC) CM/SW Contact:    ?Shade Flood, LCSW ?Phone Number: ?11/01/2021, 3:27 PM ? ?Clinical Narrative:                 ? ?Completed assessment with pt today due to change in readmission risk score. Pt is alert and oriented and very pleasant. He reports that he lives with his wife who is an Therapist, sports and his DIL who is an Therapist, sports lives down the street. Pt is independent in ADLs at home and he does not use any DME. Pt does not think he will need any HH or any other resources at dc. ? ?TOC will follow and assist if needed. ? ?Expected Discharge Plan: Home/Self Care ?Barriers to Discharge: Continued Medical Work up ? ? ?Patient Goals and CMS Choice ?Patient states their goals for this hospitalization and ongoing recovery are:: get better ?  ?  ? ?Expected Discharge Plan and Services ?Expected Discharge Plan: Home/Self Care ?In-house Referral: Clinical Social Work ?  ?  ?Living arrangements for the past 2 months: [place] ?                ?  ?  ?  ?  ?  ?  ?  ?  ?  ?  ? ?Prior Living Arrangements/Services ?Living arrangements for the past 2 months: [place] ?Lives with:: Spouse ?Patient language and need for interpreter reviewed:: Yes ?Do you feel safe going back to the place where you live?: Yes      ?Need for Family Participation in Patient Care: No (Comment) ?Care giver support system in place?: Yes (comment) ?  ?Criminal Activity/Legal Involvement Pertinent to Current Situation/Hospitalization: No - Comment as needed ? ?Activities of Daily Living ?Home Assistive Devices/Equipment: None ?ADL Screening (condition at time of admission) ?Patient's cognitive ability adequate to safely complete daily activities?: Yes ?Is the patient deaf or have difficulty hearing?: No ?Does the patient have difficulty seeing, even when wearing glasses/contacts?:  No ?Does the patient have difficulty concentrating, remembering, or making decisions?: No ?Patient able to express need for assistance with ADLs?: Yes ?Does the patient have difficulty dressing or bathing?: No ?Independently performs ADLs?: Yes (appropriate for developmental age) ?Does the patient have difficulty walking or climbing stairs?: No ?Weakness of Legs: None ?Weakness of Arms/Hands: None ? ?Permission Sought/Granted ?  ?  ?   ?   ?   ?   ? ?Emotional Assessment ?Appearance:: Appears stated age ?Attitude/Demeanor/Rapport: Engaged ?Affect (typically observed): Pleasant ?Orientation: : Oriented to Self, Oriented to Place, Oriented to  Time, Oriented to Situation ?Alcohol / Substance Use: Not Applicable ?Psych Involvement: No (comment) ? ?Admission diagnosis:  Facial cellulitis [U54.270] ?Patient Active Problem List  ? Diagnosis Date Noted  ? Metabolic acidosis 62/37/6283  ? Diabetic ketoacidosis without coma associated with type 2 diabetes mellitus ([place]) 10/31/2021  ? Hyponatremia 10/31/2021  ? Hyperbilirubinemia 10/31/2021  ? Diarrhea 10/31/2021  ? Facial cellulitis 10/30/2021  ? Sepsis ([place]) 10/30/2021  ? IBS (irritable bowel syndrome) 06/21/2020  ? History of colonic polyps 06/21/2020  ? Abnormal stress test   ? DOE (dyspnea on exertion)   ? Cervical spondylosis with myelopathy 01/05/2018  ? Transient hypotension 05/17/2016  ? GERD (gastroesophageal reflux disease) 05/17/2016  ? Chest pain 05/16/2016  ? Normal coronary arteries- cath 12/13/13 12/14/2013  ? Dyslipidemia 12/14/2013  ?  Unstable angina ([place]) 12/11/2013  ? Intermediate coronary syndrome ([place]) 12/11/2013  ? Acute combined systolic and diastolic heart failure ([place]) 12/10/2013  ? DM type 2 (diabetes mellitus, type 2) ([place]) 12/10/2013  ? APML (acute promyelocytic leukemia) in remission ([place]) 12/10/2013  ? Congestive dilated cardiomyopathy- EF 20% at cath 12/13/13 08/10/2012  ? Abnormal echocardiogram 07/01/2012  ? Aortic insufficiency 07/01/2012  ?  Hyperlipidemia 07/01/2012  ? Economy HAZARDS HEALTH 01/14/2010  ? Essential hypertension 01/13/2010  ? CHEST PAIN 01/13/2010  ? ?PCP:  Glenda Chroman, MD ?Pharmacy:   ?[facility], [address] ?[place] [address] ?Phone: [phone] Fax: [phone] ? ? ? ? ?Social Determinants of Health (SDOH) Interventions ?  ? ?Readmission Risk Interventions ? ?  11/01/2021  ?  3:26 PM  ?Readmission Risk Prevention Plan  ?Transportation Screening Complete  ?[place] or Home Care Consult Complete  ?Social Work Consult for [place] Planning/Counseling Complete  ?Palliative Care Screening Not Applicable  ?Medication Review Press photographer) Complete  ? ? ? ?

## 2021-11-02 DIAGNOSIS — R197 Diarrhea, unspecified: Secondary | ICD-10-CM | POA: Diagnosis not present

## 2021-11-02 DIAGNOSIS — L03211 Cellulitis of face: Secondary | ICD-10-CM | POA: Diagnosis not present

## 2021-11-02 DIAGNOSIS — D649 Anemia, unspecified: Secondary | ICD-10-CM

## 2021-11-02 DIAGNOSIS — E1110 Type 2 diabetes mellitus with ketoacidosis without coma: Secondary | ICD-10-CM | POA: Diagnosis not present

## 2021-11-02 DIAGNOSIS — I420 Dilated cardiomyopathy: Secondary | ICD-10-CM | POA: Diagnosis not present

## 2021-11-02 LAB — GASTROINTESTINAL PANEL BY PCR, STOOL (REPLACES STOOL CULTURE)

## 2021-11-02 LAB — CBC WITH DIFFERENTIAL/PLATELET
Abs Immature Granulocytes: 0.03 10*3/uL (ref 0.00–0.07)
Basophils Absolute: 0 10*3/uL (ref 0.0–0.1)
Basophils Relative: 1 %
Eosinophils Absolute: 0.1 10*3/uL (ref 0.0–0.5)
Eosinophils Relative: 3 %
HCT: 33.5 % — ABNORMAL LOW (ref 39.0–52.0)
Hemoglobin: 11.3 g/dL — ABNORMAL LOW (ref 13.0–17.0)
Immature Granulocytes: 1 %
Lymphocytes Relative: 25 %
Lymphs Abs: 1.1 10*3/uL (ref 0.7–4.0)
MCH: 30.7 pg (ref 26.0–34.0)
MCHC: 33.7 g/dL (ref 30.0–36.0)
MCV: 91 fL (ref 80.0–100.0)
Monocytes Absolute: 0.5 10*3/uL (ref 0.1–1.0)
Monocytes Relative: 11 %
Neutro Abs: 2.6 10*3/uL (ref 1.7–7.7)
Neutrophils Relative %: 59 %
Platelets: 209 10*3/uL (ref 150–400)
RBC: 3.68 MIL/uL — ABNORMAL LOW (ref 4.22–5.81)
RDW: 13.6 % (ref 11.5–15.5)
WBC: 4.4 10*3/uL (ref 4.0–10.5)
nRBC: 0 % (ref 0.0–0.2)

## 2021-11-02 LAB — COMPREHENSIVE METABOLIC PANEL
ALT: 19 U/L (ref 0–44)
AST: 16 U/L (ref 15–41)
Albumin: 2.7 g/dL — ABNORMAL LOW (ref 3.5–5.0)
Alkaline Phosphatase: 44 U/L (ref 38–126)
Anion gap: 10 (ref 5–15)
BUN: 9 mg/dL (ref 8–23)
CO2: 23 mmol/L (ref 22–32)
Calcium: 8 mg/dL — ABNORMAL LOW (ref 8.9–10.3)
Chloride: 106 mmol/L (ref 98–111)
Creatinine, Ser: 0.57 mg/dL — ABNORMAL LOW (ref 0.61–1.24)
GFR, Estimated: >60 mL/min (ref >60)
Glucose, Bld: 160 mg/dL — ABNORMAL HIGH (ref 70–99)
Potassium: 3.6 mmol/L (ref 3.5–5.1)
Sodium: 139 mmol/L (ref 135–145)
Total Bilirubin: 1.2 mg/dL (ref 0.3–1.2)
Total Protein: 5.5 g/dL — ABNORMAL LOW (ref 6.5–8.1)

## 2021-11-02 LAB — GLUCOSE, CAPILLARY
Glucose-Capillary: 143 mg/dL — ABNORMAL HIGH (ref 70–99)
Glucose-Capillary: 187 mg/dL — ABNORMAL HIGH (ref 70–99)
Glucose-Capillary: 170 mg/dL — ABNORMAL HIGH (ref 70–99)
Glucose-Capillary: 230 mg/dL — ABNORMAL HIGH (ref 70–99)

## 2021-11-02 LAB — RESP PANEL BY RT-PCR (FLU A&B, COVID) ARPGX2
Influenza A by PCR: NEGATIVE
Influenza B by PCR: NEGATIVE
SARS Coronavirus 2 by RT PCR: NEGATIVE

## 2021-11-02 LAB — PHOSPHORUS: Phosphorus: 1.3 mg/dL — ABNORMAL LOW (ref 2.5–4.6)

## 2021-11-02 LAB — MAGNESIUM: Magnesium: 2 mg/dL (ref 1.7–2.4)

## 2021-11-02 MED ORDER — K PHOS MONO-SOD PHOS DI & MONO 155-852-130 MG PO TABS
500.0000 mg | ORAL_TABLET | Freq: Two times a day (BID) | ORAL | Status: AC
  Administered 2021-11-02 (×2): 500 mg via ORAL
  Filled 2021-11-02 (×2): qty 2

## 2021-11-02 NOTE — Assessment & Plan Note (Addendum)
-   Patient's hemoglobin/hematocrit went from 16.1/46.4 and likely was hemoconcentrated on admission has trended down and gone from 13.3/40.2 -> 12.2/36.0 -> 11.3/33.5 and trended down to 11.1/31.9 and stable ?-Check Anemia Panel in outpatient setting ?-Continue to Monitor for S/Sx of Bleeding; No overt bleeding noted ?-Repeat CBC within 1 week ?

## 2021-11-02 NOTE — Progress Notes (Signed)
?PROGRESS NOTE ? ? ? ?Ricardo Zuniga  SWH:[id number] DOB: 08/24/1950 DOA: 10/30/2021 ?PCP: Glenda Chroman, MD  ? ?Brief Narrative:  ?The patient is a 71 year old Caucasian male with past medical history significant for but not limited to chronic systolic and diastolic CHF, diabetes mellitus type 2, hypertension, history of CAD, history of acute promyelocytic leukemia in remission as well as other comorbidities who presented to the ED with complaints of swelling and pain that initially involved his left nostril and gradually spread downwards to the involved left side of his lip.  He saw his primary care provider for which she was given an injection of antibiotic which is unknown name but likely ceftriaxone and was then prescribed Augmentin which he took.  He had persistence of his symptoms and he went back to his outpatient provider and was given a second injection of an antibiotic of unknown name and then referred to maxillofacial specialist.  He had onset of nausea vomiting and now having diarrhea and presented to the ED.  On arrival to the ED he was tachycardic and had elevated respiratory rate and elevated WBC.  Maxillofacial CT was done and showed no evidence of a drainable collection.  He was given IV clindamycin in the ED and his facial cellulitis and facial pain and swelling has improved however his main complaint is now of "feeling bad" and significant diarrhea. ? ?Patient had a persistent acidosis and so we changed him to a sodium bicarbonate drip.  He continued to have significant diarrhea but this is improving; We are awaiting the GI pathogen panel.  We have given him judicious amount of fluid given his history of heart failure and he had to be given a few boluses yesterday. He now has 1+ LE Pedal Edema. ? ?His beta hydroxybutyrate acid had trended the wrong way and initially was trending down and went to 3.83 yesterday but trended up to 4.93 yesterday and after fluid resuscitation and trended back down to  2.37.  Patient is not as nauseous now we will continue supportive care and have stopped with gentle IV fluid hydration.  Anticipating discharging home in the next 24 to 40 hours and currently awaiting the results of the GI pathogen panel, repeat COVID test as well as PT/OT evaluation.   ? ? ?Assessment and Plan: ?* Facial cellulitis ?-Facial cellulitis with sepsis on admission as he was Tachycardic 118-123 had a Leukocytosis of 12.2.  Afebrile temperature 98.5.  Normal lactic acid 1.5.  Maxillo facial CT without evidence of drainable collection. ?-Failed outpatient antibiotics Augmentin ?-IV vancomycin and cefepime now stopped and changed to Doxycycline ?-IVF now stopped  ?-Follow-up blood culture results ?-Hydrocodone-acetaminophen 5/325 PRN ? ? ?Sepsis ([place]) ?- Patient met sepsis criteria as patient was tachycardic with a heart rate above 100 and a respiratory rate above 20 as well as a leukocytosis and a source of infection ?-Patient has failed outpatient antibiotics with Augmentin and was given IV clindamycin in the ED ?-Was on IV vancomycin and IV cefepime but will discontinue now ?-Has been seen by maxillofacial specialist ?-Given his history of congestive dilated nonischemic cardiomyopathy with to be judicious with his fluids and given him to 500 mL boluses today and then started him on a sodium bicarbonate drip which we will continue at 75 MLS per hour. ?-Blood cultures x2 have been obtained and showed no growth to date at 3 days ?-Lactic acid level was 1.5 on admission  ?-WBC was 12.2 is now improved to 4.4 ?-CT Maxillofacial done  and showed "Nonspecific soft tissue swelling of the upper lip and base of the nose. No evidence of a drainable low-density collection by CT. Mild mucosal thickening of the maxillary sinuses with a small amount of layering fluid on the right." ?-We will consolidate his antibiotics and change him to IV doxycycline 11/01/21 and will change to po  ?-IVF now to stop.  ?-Will order PT/OT  to further evaluate and treat ?  ? ? ?Normocytic anemia ?- Patient's hemoglobin/hematocrit went from 16.1/46.4 and likely was hemoconcentrated on admission has trended down and gone from 13.3/40.2 -> 12.2/36.0 -> 11.3/33.5 ?-Check Anemia Panel in the AM ?-Continue to Monitor for S/Sx of Bleeding; No overt bleeding noted ?-Repeat CBC in the AM  ? ?Hypophosphatemia ?-Patient's Phos Level went from 1.4 -> 1.3 ?-Replete with po K Phos Neutral 500 mg po BID x2 ?-Continue to Monitor and Replete as Necessary ?-Repeat Phos Level in the AM ? ?Diarrhea ?-Could be contributing to his dehydration and ketosis; Improving now ?-Check for infectious diarrhea and checking C. difficile which was negative and a GI pathogen panel which is pending ?-Patient is afebrile and has no leukocytosis now ?-ESR is elevated and CRP was 9 ?-Of note patient has been recently exposed to COVID-19 disease and his wife tested positive for COVID yesterday but he has no respiratory symptoms and his chest x-ray was clear ?-Question if this diarrhea is related to antibiotics or if it is related to COVID-19 disease or some sort of infection; Repeat COVID Test currently  ?-I have added Florastor and will continue for now ?-Continue antibiotics for now and de-escalated to just Doxy ?-PT/OT Evaluation pending  ? ?Hyperbilirubinemia ?- Patient's T. bili trending upwards and went from 1.1 is now 1.5 -> 1.4 -> 1.2 x2 ?-Continue IV fluid hydration ?-Continue to monitor and trend and repeat CMP in a.m. ? ?Hyponatremia ?-Mild and Improved. Na+ is now 139 ?-IVF now stopped ?-Repeat CMP in a.m. ? ? ?Diabetic ketoacidosis without coma associated with type 2 diabetes mellitus ([place]) ?-Likely SGLT2 inhibitor mediated as well as in the setting of significant diarrhea ?-He has beta hydroxybutyrate acid was worsening and went from 2.98-> 4.34 -> 4.93 -> 2.37 ?-IVF now stopped  ?-Holding his Invokana ?-Has a mild metabolic acidosis which is worsened from admission and his CO2  is now 18, anion gap is 17, chloride level is 99 ?-Blood sugars have been relatively well controlled ?-CBGs ranging from 129-187 so no indication for insulin drip at this time ?-If continues to worsen may consider insulin drip ?-For now we will keep him on a clear liquid diet given his diarrhea ?-WBC is improved from 12.2 and is now 4.4 ?-Continue to monitor CBGs and continue with insulin regimen as delineated ?-Of note he did have some ketones on his urinalysis ?-Will Consider getting a VBG but will not repeat his beta hydroxybutyrate acid this afternoon given that he is improving  ? ? ? ?Metabolic acidosis ?- Had a metabolic acidosis on admission with a CO2 of 21, anion gap of 16, and a chloride level of 99; now it had worsened but is now improving as patient's CO2 is 23, anion gap is 10, chloride level is 106.  ?-Patient did have elevated beta hydroxybutyrate acid on admission of 2.98 and trended up to 4.34 -> 3.83 -> 4.93 -> 2.37 ?-Fortunately lactic acid level is not elevated and was 1.5 ?-We will check a urinalysis to evaluate for ketones and he did have 80 ?-IVF has been changed to  Sodium Bicarbonate 75 mL/hr but now stopped ?-Continue to monitor trend and repeat CMP in the a.m. ? ?DM type 2 (diabetes mellitus, type 2) ([place]) ?-Random glucose 249.   ?-Home medications-Invokana.   ?-Anion gap borderline elevated at 16, serum bicarb borderline low at 21 Blood glucose 249.  Mild elevated BHA- 2.98 -> 4.93 -> 2.37 ?-SSI- M and gentle hydration for now ?-Continue to Trend ?-CBGs ranging from 129-187 ?-Hold home Invokana, rapaglinide ?-Hemoglobin A1c was 8.7 ?-Continue monitor blood sugars per protocol and may need to initiate insulin drip ? ?Congestive dilated cardiomyopathy- EF 20% at cath 12/13/13 ?-Stable and compensated.   ?-Last echo 2022 EF 40 to 45%.  Follows with Dr. Domenic Polite.  ?-Compliance with medications including Entresto and lasix. ?-Hold Lasix ?-Strict I's and O's and Daily Weights; Patient is +6.814  Liters since admission ?-Will hold Entresto, spironolactone and Metoprolol ?-C/w aspirin and atorvastatin ?-Continue to monitor for signs and symptoms of volume overload and he Has 1+ LE Edema ?-Will

## 2021-11-02 NOTE — Assessment & Plan Note (Addendum)
-  Patient's Phos Level went from 1.4 -> 1.3 and today was 2.1 and repleted prior to discharge ?-Replete with po K Phos Neutral 500 mg po BID x2 ?-Continue to Monitor and Replete as Necessary ?-Repeat Phos Level in the AM ?

## 2021-11-02 NOTE — Progress Notes (Signed)
Patient has 1+ pitting edema in his feet. Patient states he normally takes furosemide at home. MD [facility] notified.  ?

## 2021-11-03 DIAGNOSIS — L03211 Cellulitis of face: Secondary | ICD-10-CM | POA: Diagnosis not present

## 2021-11-03 DIAGNOSIS — A0811 Acute gastroenteropathy due to Norwalk agent: Secondary | ICD-10-CM

## 2021-11-03 DIAGNOSIS — D649 Anemia, unspecified: Secondary | ICD-10-CM

## 2021-11-03 DIAGNOSIS — I420 Dilated cardiomyopathy: Secondary | ICD-10-CM | POA: Diagnosis not present

## 2021-11-03 DIAGNOSIS — E1110 Type 2 diabetes mellitus with ketoacidosis without coma: Secondary | ICD-10-CM | POA: Diagnosis not present

## 2021-11-03 DIAGNOSIS — A09 Infectious gastroenteritis and colitis, unspecified: Secondary | ICD-10-CM | POA: Diagnosis not present

## 2021-11-03 LAB — GLUCOSE, CAPILLARY
Glucose-Capillary: 157 mg/dL — ABNORMAL HIGH (ref 70–99)
Glucose-Capillary: 184 mg/dL — ABNORMAL HIGH (ref 70–99)

## 2021-11-03 LAB — CBC WITH DIFFERENTIAL/PLATELET
Abs Immature Granulocytes: 0.05 10*3/uL (ref 0.00–0.07)
Basophils Absolute: 0 10*3/uL (ref 0.0–0.1)
Basophils Relative: 1 %
Eosinophils Absolute: 0.1 10*3/uL (ref 0.0–0.5)
Eosinophils Relative: 2 %
HCT: 31.9 % — ABNORMAL LOW (ref 39.0–52.0)
Hemoglobin: 11.1 g/dL — ABNORMAL LOW (ref 13.0–17.0)
Immature Granulocytes: 1 %
Lymphocytes Relative: 32 %
Lymphs Abs: 1.7 10*3/uL (ref 0.7–4.0)
MCH: 31.2 pg (ref 26.0–34.0)
MCHC: 34.8 g/dL (ref 30.0–36.0)
MCV: 89.6 fL (ref 80.0–100.0)
Monocytes Absolute: 0.6 10*3/uL (ref 0.1–1.0)
Monocytes Relative: 11 %
Neutro Abs: 2.9 10*3/uL (ref 1.7–7.7)
Neutrophils Relative %: 53 %
Platelets: 224 10*3/uL (ref 150–400)
RBC: 3.56 MIL/uL — ABNORMAL LOW (ref 4.22–5.81)
RDW: 13.2 % (ref 11.5–15.5)
WBC: 5.4 10*3/uL (ref 4.0–10.5)
nRBC: 0 % (ref 0.0–0.2)

## 2021-11-03 LAB — COMPREHENSIVE METABOLIC PANEL
ALT: 19 U/L (ref 0–44)
AST: 15 U/L (ref 15–41)
Albumin: 2.7 g/dL — ABNORMAL LOW (ref 3.5–5.0)
Alkaline Phosphatase: 42 U/L (ref 38–126)
Anion gap: 7 (ref 5–15)
BUN: 6 mg/dL — ABNORMAL LOW (ref 8–23)
CO2: 28 mmol/L (ref 22–32)
Calcium: 8.2 mg/dL — ABNORMAL LOW (ref 8.9–10.3)
Chloride: 105 mmol/L (ref 98–111)
Creatinine, Ser: 0.59 mg/dL — ABNORMAL LOW (ref 0.61–1.24)
GFR, Estimated: >60 mL/min (ref >60)
Glucose, Bld: 176 mg/dL — ABNORMAL HIGH (ref 70–99)
Potassium: 3.3 mmol/L — ABNORMAL LOW (ref 3.5–5.1)
Sodium: 140 mmol/L (ref 135–145)
Total Bilirubin: 0.5 mg/dL (ref 0.3–1.2)
Total Protein: 5.4 g/dL — ABNORMAL LOW (ref 6.5–8.1)

## 2021-11-03 LAB — MAGNESIUM: Magnesium: 1.8 mg/dL (ref 1.7–2.4)

## 2021-11-03 LAB — PHOSPHORUS: Phosphorus: 2.1 mg/dL — ABNORMAL LOW (ref 2.5–4.6)

## 2021-11-03 MED ORDER — DOXYCYCLINE HYCLATE 100 MG PO TABS: 100.0000 mg | ORAL_TABLET | Freq: Two times a day (BID) | ORAL | 0 refills | Status: AC

## 2021-11-03 MED ORDER — FUROSEMIDE 20 MG PO TABS: 20.0000 mg | ORAL_TABLET | Freq: Every morning | ORAL | Status: DC

## 2021-11-03 MED ORDER — FUROSEMIDE 20 MG PO TABS
20.0000 mg | ORAL_TABLET | Freq: Every day | ORAL | Status: DC
Start: 2021-11-03 — End: 2021-11-03
  Administered 2021-11-03: 20 mg via ORAL
  Filled 2021-11-03: qty 1

## 2021-11-03 MED ORDER — SACUBITRIL-VALSARTAN 24-26 MG PO TABS
1.0000 | ORAL_TABLET | Freq: Two times a day (BID) | ORAL | Status: DC
  Administered 2021-11-03: 1 via ORAL
  Filled 2021-11-03: qty 1

## 2021-11-03 MED ORDER — ONDANSETRON HCL 4 MG PO TABS: 4.0000 mg | ORAL_TABLET | Freq: Four times a day (QID) | ORAL | 0 refills | Status: DC | PRN

## 2021-11-03 MED ORDER — MAGNESIUM SULFATE 2 GM/50ML IV SOLN
2.0000 g | Freq: Once | INTRAVENOUS | Status: AC
  Administered 2021-11-03: 2 g via INTRAVENOUS
  Filled 2021-11-03: qty 50

## 2021-11-03 MED ORDER — SACCHAROMYCES BOULARDII 250 MG PO CAPS: 250.0000 mg | ORAL_CAPSULE | Freq: Two times a day (BID) | ORAL | 0 refills | Status: AC

## 2021-11-03 MED ORDER — K PHOS MONO-SOD PHOS DI & MONO 155-852-130 MG PO TABS
500.0000 mg | ORAL_TABLET | Freq: Two times a day (BID) | ORAL | Status: DC
  Administered 2021-11-03: 500 mg via ORAL
  Filled 2021-11-03: qty 2

## 2021-11-03 NOTE — Discharge Summary (Signed)
?Physician Discharge Summary ?  ?Patient: Ricardo Zuniga MRN: [id number] DOB: [birth year]  ?Admit date:     10/30/2021  ?Discharge date: 11/03/21  ?Discharge Physician: Raiford Noble, DO  ? ?PCP: Glenda Chroman, MD  ? ?Recommendations at discharge:  ? ?Follow-up with PCP within 1 to 2 weeks and repeat CBC, CMP, mag, Phos ?Complete antibiotic course for facial cellulitis and follow-up with maxillofacial specialist if necessary ?Resume diuretics and antihypertensives and follow-up with cardiology Dr. Domenic Polite within 1 to 2 weeks ? ?Discharge Diagnoses: ?Principal Problem: ?  Facial cellulitis ?Active Problems: ?  Sepsis ([place]) ?  Essential hypertension ?  Congestive dilated cardiomyopathy- EF 20% at cath 12/13/13 ?  DM type 2 (diabetes mellitus, type 2) ([place]) ?  Metabolic acidosis ?  Diabetic ketoacidosis without coma associated with type 2 diabetes mellitus ([place]) ?  Hyponatremia ?  Hyperbilirubinemia ?  Diarrhea ?  Hypophosphatemia ?  Normocytic anemia ? ?Resolved Problems: ?  * No resolved hospital problems. * ? ?Hospital Course: ?The patient is a 71 year old Caucasian male with past medical history significant for but not limited to chronic systolic and diastolic CHF, diabetes mellitus type 2, hypertension, history of CAD, history of acute promyelocytic leukemia in remission as well as other comorbidities who presented to the ED with complaints of swelling and pain that initially involved his left nostril and gradually spread downwards to the involved left side of his lip.  He saw his primary care provider for which she was given an injection of antibiotic which is unknown name but likely ceftriaxone and was then prescribed Augmentin which he took.  He had persistence of his symptoms and he went back to his outpatient provider and was given a second injection of an antibiotic of unknown name and then referred to maxillofacial specialist.  He had onset of nausea vomiting and now having diarrhea and presented to the ED.   On arrival to the ED he was tachycardic and had elevated respiratory rate and elevated WBC.  Maxillofacial CT was done and showed no evidence of a drainable collection.  He was given IV clindamycin in the ED and his facial cellulitis and facial pain and swelling has improved however his main complaint is now of "feeling bad" and significant diarrhea. ? ?Patient had a persistent acidosis and so we changed him to a sodium bicarbonate drip.  He continued to have significant diarrhea but this is improving; We are awaiting the GI pathogen panel.  We have given him judicious amount of fluid given his history of heart failure and he had to be given a few boluses yesterday. He now has 1+ LE Pedal Edema. ? ?His beta hydroxybutyrate acid had trended the wrong way and initially was trending down and went to 3.83 yesterday but trended up to 4.93 yesterday and after fluid resuscitation and trended back down to 2.37.  Patient is not as nauseous now we will continue supportive care and have stopped with gentle IV fluid hydration.  ? ?Patient is COVID testing repeat was negative but his GI pathogen panel did come back positive for norovirus likely causing his symptoms.  He is much improved and feels close to his baseline.  PT OT evaluated and recommending no follow-up and he is stable for discharge at this time and will follow-up with his PCP. ? ?Assessment and Plan: ?* Facial cellulitis ?-Facial cellulitis with sepsis on admission as he was Tachycardic 118-123 had a Leukocytosis of 12.2.  Afebrile temperature 98.5.  Normal lactic acid 1.5.  [place] facial CT without evidence of drainable collection. ?-Failed outpatient antibiotics Augmentin ?-IV vancomycin and cefepime now stopped and changed to Doxycycline and will continue for 5 more days ?-IVF now stopped  ?-Follow-up blood culture results ?-Hydrocodone-acetaminophen 5/325 PRN ? ? ?Sepsis ([place]) ?- Patient met sepsis criteria as patient was tachycardic with a heart rate above  100 and a respiratory rate above 20 as well as a leukocytosis and a source of infection of his facial cellulitis but likely had concomitant norovirus causing majority of his symptoms ?-Patient has failed outpatient antibiotics with Augmentin and was given IV clindamycin in the ED ?-Was on IV vancomycin and IV cefepime but will discontinue now and he was transitioned doxycycline ?-Has been seen by maxillofacial specialist ?-Given his history of congestive dilated nonischemic cardiomyopathy with to be judicious with his fluids and given him to 500 mL boluses today and then started him on a sodium bicarbonate drip which we will continue at 75 MLS per hour. ?-Blood cultures x2 have been obtained and showed no growth to date at 3 days ?-Lactic acid level was 1.5 on admission  ?-WBC was 12.2 is now improved to 4.4 ?-CT Maxillofacial done and showed "Nonspecific soft tissue swelling of the upper lip and base of the nose. No evidence of a drainable low-density collection by CT. Mild mucosal thickening of the maxillary sinuses with a small amount of layering fluid on the right." ?-We will consolidate his antibiotics and change him to IV doxycycline 11/01/21 and will change to po and complete 5 more days for discharge ?-IVF now to stop.  ?-Will order PT/OT to further evaluate and treat and they are recommending a follow-up ?-Sepsis physiology has since resolved ?  ? ? ?Normocytic anemia ?- Patient's hemoglobin/hematocrit went from 16.1/46.4 and likely was hemoconcentrated on admission has trended down and gone from 13.3/40.2 -> 12.2/36.0 -> 11.3/33.5 and trended down to 11.1/31.9 and stable ?-Check Anemia Panel in outpatient setting ?-Continue to Monitor for S/Sx of Bleeding; No overt bleeding noted ?-Repeat CBC within 1 week ? ?Hypophosphatemia ?-Patient's Phos Level went from 1.4 -> 1.3 and today was 2.1 and repleted prior to discharge ?-Replete with po K Phos Neutral 500 mg po BID x2 ?-Continue to Monitor and Replete as  Necessary ?-Repeat Phos Level in the AM ? ?Diarrhea ?-Could be contributing to his dehydration and ketosis; and he was found to have Norovirus ?-Check for infectious diarrhea and checking C. difficile which was negative and a GI pathogen panel which is + for NOROVIRUS ?-Patient is afebrile and has no leukocytosis now ?-ESR is elevated and CRP was 9 ?-Of note patient has been recently exposed to COVID-19 disease and his wife tested positive for COVID yesterday but he has no respiratory symptoms and his chest x-ray was clear ?-Question if this diarrhea is related to antibiotics or if it is related to COVID-19 disease or some sort of infection; Repeat COVID Test currently  ?-I have added Florastor and will continue for now ?-Continue antibiotics for now and de-escalated to just Doxy ?-PT/OT Evaluation Negative  ?-Follow in the outpatient setting and Diarrhea has improved  ? ?Hyperbilirubinemia ?- Patient's T. bili trending upwards and went from 1.1 is now 1.5 -> 1.4 -> 1.2 x2 and has now normalized at 0.5 ?-IV fluid hydration stopped ?-Continue to monitor and trend and repeat CMP in a.m. ? ?Hyponatremia ?-Mild and Improved. Na+ is now 140 ?-IVF now stopped ?-Repeat CMP in a.m. ? ? ?Diabetic ketoacidosis without coma associated with type 2  diabetes mellitus ([place]) ?-Likely SGLT2 inhibitor mediated as well as in the setting of significant diarrhea ?-He has beta hydroxybutyrate acid was worsening and went from 2.98-> 4.34 -> 4.93 -> 2.37 ?-IVF now stopped  ?-Holding his Invokana ?-Has a mild metabolic acidosis which is worsened from admission and his CO2 is now 18, anion gap is 17, chloride level is 99 ?-Blood sugars have been relatively well controlled ?-CBGs ranging from 129-187 so no indication for insulin drip at this time ?-If continues to worsen may consider insulin drip ?-For now we will keep him on a clear liquid diet given his diarrhea ?-WBC is improved from 12.2 and is now 4.4 ?-Continue to monitor CBGs and  continue with insulin regimen as delineated ?-Of note he did have some ketones on his urinalysis ?-Will Consider getting a VBG but will not repeat his beta hydroxybutyrate acid this afternoon given that he is im

## 2021-11-03 NOTE — Evaluation (Signed)
Physical Therapy Evaluation ?Patient Details ?Name: Ricardo Zuniga ?MRN: [id number] ?DOB: 1950/10/11 ?Today's Date: 11/03/2021 ? ?History of Present Illness ? Ricardo Zuniga is a 71 y.o. male with medical history significant for systolic and diastolic CHF, diabetes mellitus, hypertension, coronary artery disease, acute promyelocytic leukemia in remission  Patient presented to the ED with complaints of swelling and pain that initially involved his left nostril, and has gradually spread downwards to involve the left side of his lip.  He saw his primary care provider for this, he was given an injection of an antibiotic (Unknown name) and prescribed Augmentin which she tells me he took.  Persistence of symptoms, he went back to his outpatient provider and was given a 2nd injection of an antibiotic ( Unknown name).  He was referred to maxillofacial specialist.   With onset of nausea and vomiting today, he presented to the ED.    He denies fever or chills. ?  ?Clinical Impression ? Patient functioning at baseline for functional mobility and gait demonstrating good return for ambulation in room, hallway and on stairs without loss of balance.  Plan:  Patient discharged from physical therapy to care of nursing for ambulation daily as tolerated for length of stay.  ?   ?   ? ?Recommendations for follow up therapy are one component of a multi-disciplinary discharge planning process, led by the attending physician.  Recommendations may be updated based on patient status, additional functional criteria and insurance authorization. ? ?Follow Up Recommendations No PT follow up ? ?  ?Assistance Recommended at Discharge PRN  ?Patient can return home with the following ? Other (comment) (at baseline) ? ?  ?Equipment Recommendations None recommended by PT  ?Recommendations for Other Services ?    ?  ?Functional Status Assessment Patient has not had a recent decline in their functional status  ? ?  ?Precautions / Restrictions  Precautions ?Precautions: None ?Restrictions ?Weight Bearing Restrictions: No  ? ?  ? ?Mobility ? Bed Mobility ?Overal bed mobility: Independent ?  ?  ?  ?  ?  ?  ?  ?  ? ?Transfers ?Overall transfer level: Independent ?  ?  ?  ?  ?  ?  ?  ?  ?  ?  ? ?Ambulation/Gait ?Ambulation/Gait assistance: Modified independent (Device/Increase time) ?Gait Distance (Feet): 100 Feet ?Assistive device: None ?Gait Pattern/deviations: WFL(Within Functional Limits) ?Gait velocity: near normal ?  ?  ?General Gait Details: demonstrates good return for ambulation in room and hallways without loss of balance ? ?Stairs ?Stairs: Yes ?Stairs assistance: Modified independent (Device/Increase time) ?Stair Management: One rail Right, One rail Left, Alternating pattern ?Number of Stairs: 10 ?General stair comments: demonstrates good return for going up/down stairs using 1 side rail without loss of balance ? ?Wheelchair Mobility ?  ? ?Modified Rankin (Stroke Patients Only) ?  ? ?  ? ?Balance Overall balance assessment: Modified Independent ?  ?  ?  ?  ?  ?  ?  ?  ?  ?  ?  ?  ?  ?  ?  ?  ?  ?  ?   ? ? ? ?Pertinent Vitals/Pain Pain Assessment ?Pain Assessment: No/denies pain  ? ? ?Home Living Family/patient expects to be discharged to:: Private residence ?Living Arrangements: Spouse/significant other ?Available Help at Discharge: Family ?Type of Home: House ?Home Access: Stairs to enter ?Entrance Stairs-Rails: None ?Entrance Stairs-Number of Steps: 1 ?Alternate Level Stairs-Number of Steps: 5-6 landing 5-6 to bed room ?Home Layout: Two  level;Able to live on main level with bedroom/bathroom;Bed/bath upstairs;Full bath on main level ?Home Equipment: Conservation officer, nature (2 wheels);Cane - single point;Shower seat - built in ?   ?  ?Prior Function Prior Level of Function : Independent/Modified Independent ?  ?  ?  ?  ?  ?  ?Mobility Comments: Independent community ambulator, drives ?ADLs Comments: Independent ?  ? ? ?Hand Dominance  ? Dominant Hand:  Right ? ?  ?Extremity/Trunk Assessment  ? Upper Extremity Assessment ?Upper Extremity Assessment: Overall WFL for tasks assessed ?  ? ?Lower Extremity Assessment ?Lower Extremity Assessment: Overall WFL for tasks assessed ?  ? ?Cervical / Trunk Assessment ?Cervical / Trunk Assessment: Normal  ?Communication  ? Communication: No difficulties  ?Cognition Arousal/Alertness: Awake/alert ?Behavior During Therapy: [facility] for tasks assessed/performed ?Overall Cognitive Status: Within Functional Limits for tasks assessed ?  ?  ?  ?  ?  ?  ?  ?  ?  ?  ?  ?  ?  ?  ?  ?  ?  ?  ?  ? ?  ?General Comments   ? ?  ?Exercises    ? ?Assessment/Plan  ?  ?PT Assessment Patient does not need any further PT services  ?PT Problem List   ? ?   ?  ?PT Treatment Interventions     ? ?PT Goals (Current goals can be found in the Care Plan section)  ?Acute Rehab PT Goals ?Patient Stated Goal: return home with family to assist ?PT Goal Formulation: With patient ?Time For Goal Achievement: 11/03/21 ?Potential to Achieve Goals: Good ? ?  ?Frequency   ?  ? ? ?Co-evaluation   ?  ?  ?  ?  ? ? ?  ?AM-PAC PT "6 Clicks" Mobility  ?Outcome Measure Help needed turning from your back to your side while in a flat bed without using bedrails?: None ?Help needed moving from lying on your back to sitting on the side of a flat bed without using bedrails?: None ?Help needed moving to and from a bed to a chair (including a wheelchair)?: None ?Help needed standing up from a chair using your arms (e.g., wheelchair or bedside chair)?: None ?Help needed to walk in hospital room?: None ?Help needed climbing 3-5 steps with a railing? : None ?6 Click Score: 24 ? ?  ?End of Session   ?Activity Tolerance: Patient tolerated treatment well ?Patient left: in bed;with call bell/phone within reach ?Nurse Communication: Mobility status ?PT Visit Diagnosis: Unsteadiness on feet (R26.81);Other abnormalities of gait and mobility (R26.89);Muscle weakness (generalized) (M62.81) ?   ? ?Time: 4503-8882 ?PT Time Calculation (min) (ACUTE ONLY): 20 min ? ? ?Charges:   PT Evaluation ?$PT Eval Moderate Complexity: 1 Mod ?  ?  ?   ? ? ?11:24 AM, 11/03/21 ?Lonell Grandchild, MPT ?Physical Therapist with [place] ?[facility] ?[phone] office ?[phone] mobile phone ? ? ?

## 2021-11-04 LAB — CULTURE, BLOOD (ROUTINE X 2)
Culture: NO GROWTH
Culture: NO GROWTH
Special Requests: ADEQUATE
Special Requests: ADEQUATE

## 2021-11-06 ENCOUNTER — Telehealth: Payer: Self-pay | Admitting: Cardiology

## 2021-11-06 NOTE — Telephone Encounter (Signed)
Pt was admitted last week at [facility] for cellulitis, since he's been discharged he's started to have swelling.  ? ?He's going to his primary care on Friday and will discuss it with him, but he wanted Dr. Domenic Polite to be aware.  ?

## 2021-11-08 DIAGNOSIS — Z6827 Body mass index (BMI) 27.0-27.9, adult: Secondary | ICD-10-CM | POA: Diagnosis not present

## 2021-11-08 DIAGNOSIS — I10 Essential (primary) hypertension: Secondary | ICD-10-CM | POA: Diagnosis not present

## 2021-11-08 DIAGNOSIS — I509 Heart failure, unspecified: Secondary | ICD-10-CM | POA: Diagnosis not present

## 2021-11-08 DIAGNOSIS — J340 Abscess, furuncle and carbuncle of nose: Secondary | ICD-10-CM | POA: Diagnosis not present

## 2021-11-08 DIAGNOSIS — E1165 Type 2 diabetes mellitus with hyperglycemia: Secondary | ICD-10-CM | POA: Diagnosis not present

## 2021-11-08 DIAGNOSIS — E261 Secondary hyperaldosteronism: Secondary | ICD-10-CM | POA: Diagnosis not present

## 2021-11-08 DIAGNOSIS — Z299 Encounter for prophylactic measures, unspecified: Secondary | ICD-10-CM | POA: Diagnosis not present

## 2021-11-30 ENCOUNTER — Other Ambulatory Visit: Payer: Self-pay | Admitting: Cardiology

## 2021-12-27 ENCOUNTER — Other Ambulatory Visit: Payer: Self-pay | Admitting: Cardiology

## 2021-12-29 ENCOUNTER — Emergency Department (HOSPITAL_COMMUNITY)
Admission: EM | Admit: 2021-12-29 | Discharge: 2021-12-30 | Disposition: A | Discharge status: Home / Self Care | Payer: Medicare PPO | Attending: Emergency Medicine | Admitting: Emergency Medicine

## 2021-12-29 ENCOUNTER — Other Ambulatory Visit: Payer: Self-pay

## 2021-12-29 ENCOUNTER — Emergency Department (HOSPITAL_COMMUNITY): Payer: Medicare PPO

## 2021-12-29 ENCOUNTER — Encounter (HOSPITAL_COMMUNITY): Payer: Self-pay | Admitting: Emergency Medicine

## 2021-12-29 DIAGNOSIS — Z7984 Long term (current) use of oral hypoglycemic drugs: Secondary | ICD-10-CM | POA: Insufficient documentation

## 2021-12-29 DIAGNOSIS — I2510 Atherosclerotic heart disease of native coronary artery without angina pectoris: Secondary | ICD-10-CM | POA: Insufficient documentation

## 2021-12-29 DIAGNOSIS — M5126 Other intervertebral disc displacement, lumbar region: Secondary | ICD-10-CM | POA: Diagnosis not present

## 2021-12-29 DIAGNOSIS — E1140 Type 2 diabetes mellitus with diabetic neuropathy, unspecified: Secondary | ICD-10-CM | POA: Diagnosis not present

## 2021-12-29 DIAGNOSIS — Z7982 Long term (current) use of aspirin: Secondary | ICD-10-CM | POA: Insufficient documentation

## 2021-12-29 DIAGNOSIS — M48061 Spinal stenosis, lumbar region without neurogenic claudication: Secondary | ICD-10-CM | POA: Diagnosis not present

## 2021-12-29 DIAGNOSIS — G834 Cauda equina syndrome: Secondary | ICD-10-CM | POA: Insufficient documentation

## 2021-12-29 DIAGNOSIS — M545 Low back pain, unspecified: Secondary | ICD-10-CM | POA: Diagnosis present

## 2021-12-29 LAB — URINALYSIS, ROUTINE W REFLEX MICROSCOPIC
Bacteria, UA: NONE SEEN
Bilirubin Urine: NEGATIVE
Glucose, UA: >=500 mg/dL — AB
Hgb urine dipstick: NEGATIVE
Ketones, ur: 5 mg/dL — AB
Leukocytes,Ua: NEGATIVE
Nitrite: NEGATIVE
Protein, ur: NEGATIVE mg/dL
Specific Gravity, Urine: 1.026 (ref 1.005–1.030)
pH: 5 (ref 5.0–8.0)

## 2021-12-29 LAB — BASIC METABOLIC PANEL
Anion gap: 7 (ref 5–15)
BUN: 16 mg/dL (ref 8–23)
CO2: 21 mmol/L — ABNORMAL LOW (ref 22–32)
Calcium: 9.1 mg/dL (ref 8.9–10.3)
Chloride: 107 mmol/L (ref 98–111)
Creatinine, Ser: 0.98 mg/dL (ref 0.61–1.24)
GFR, Estimated: >60 mL/min (ref >60)
Glucose, Bld: 241 mg/dL — ABNORMAL HIGH (ref 70–99)
Potassium: 3.8 mmol/L (ref 3.5–5.1)
Sodium: 135 mmol/L (ref 135–145)

## 2021-12-29 LAB — CBC
HCT: 41.9 % (ref 39.0–52.0)
Hemoglobin: 15.2 g/dL (ref 13.0–17.0)
MCH: 32.7 pg (ref 26.0–34.0)
MCHC: 36.3 g/dL — ABNORMAL HIGH (ref 30.0–36.0)
MCV: 90.1 fL (ref 80.0–100.0)
Platelets: 224 10*3/uL (ref 150–400)
RBC: 4.65 MIL/uL (ref 4.22–5.81)
RDW: 13.8 % (ref 11.5–15.5)
WBC: 6.7 10*3/uL (ref 4.0–10.5)
nRBC: 0 % (ref 0.0–0.2)

## 2021-12-29 MED ORDER — DIAZEPAM 2 MG PO TABS
2.0000 mg | ORAL_TABLET | Freq: Once | ORAL | Status: AC
  Administered 2021-12-29: 2 mg via ORAL
  Filled 2021-12-29: qty 1

## 2021-12-29 MED ORDER — GADOBUTROL 1 MMOL/ML IV SOLN
8.5000 mL | Freq: Once | INTRAVENOUS | Status: AC | PRN
  Administered 2021-12-29: 8.5 mL via INTRAVENOUS

## 2021-12-29 NOTE — Discharge Instructions (Addendum)
Return to the ED with any new or worsening symptoms such as increased numbness or tingling in the feet, increased lower extremity weakness, increased numbness of groin, bowel or bladder incontinence Please follow-up with Dr. Zada Finders in his office.  Please call to make an appointment to be seen.  His number is attached to this document.

## 2021-12-29 NOTE — ED Provider Notes (Cosign Needed Addendum)
[facility] EMERGENCY DEPARTMENT Provider Note   CSN: [id number] Arrival date & time: 12/29/21  1709     History  Chief Complaint  Patient presents with   Back Pain    Ricardo Zuniga is a 71 y.o. male with history of acute promyelocytic leukemia in remission, coronary artery disease, GERD, type 2 diabetes, nonischemic cardiomyopathy.  Patient presents to ED for evaluation.  Patient reports that yesterday Ricardo Zuniga did more lifting than usual, lifting 20 pound vase up some stairs as well as other household chores. The patient reports that today at church, Ricardo Zuniga was locking up when all of a sudden Ricardo Zuniga had sudden onset feeling of needing to defecate.  Patient reports that by the time Ricardo Zuniga got to the bathroom and sat down on the toilet, Ricardo Zuniga could not feel the toilet on his bottom. Patient states that once Ricardo Zuniga got to the bathroom, the feeling to defecate was no longer there, Ricardo Zuniga was unable to have bowel movement.  Patient reported about 2 hours of saddle anesthesia at this time. Patient reports that Ricardo Zuniga is also experiencing extreme lower extremity weakness after about 4 to 5 minutes of walking along with tingling in his feet. Patient states that leaning over will relieve this pain. Patient states Ricardo Zuniga has history of diabetic neuropathy however this sensation of numbness is different from his baseline diabetic neuropathy and will sometimes extend up to his knees however during our discussion Ricardo Zuniga states the feeling of numbness is only present in his feet. Patient also complaining of dull low back ache that Ricardo Zuniga hesitates to call pain, instead referring to it as discomfort that is diffusely spread across the entire low back with no localization. Patient denies ever using the bathroom on himself. Patient denies any recent fevers, IV drug use.  Patient denies any chest pain or shortness of breath. Patient states Ricardo Zuniga has history of lumbar fusion 20 years ago.    Back Pain Associated symptoms: numbness    Associated symptoms: no chest pain and no fever       Home Medications Prior to Admission medications   Medication Sig Start Date End Date Taking? Authorizing Provider  acetaminophen (TYLENOL) 650 MG CR tablet Take 1,300 mg by mouth every 8 (eight) hours as needed for pain.    [provider]  albuterol (VENTOLIN HFA) 108 (90 Base) MCG/ACT inhaler Inhale 2 puffs into the lungs See admin instructions. Inhale 2 puffs 4 times daily until 3/12 then 2 puffs every 6 hours as needed for shortness of breath    [provider]  aspirin EC 81 MG tablet Take 81 mg by mouth daily.    [provider]  atorvastatin (LIPITOR) 40 MG tablet Take 1 tablet by mouth once daily Patient taking differently: Take 40 mg by mouth every evening. 09/23/21   Satira Sark, MD  Cholecalciferol (VITAMIN D3 PO) Take 1 tablet by mouth in the morning.    [provider]  diazepam (VALIUM) 5 MG tablet Take 5 mg by mouth daily as needed. 10/17/21   [provider]  ENTRESTO 24-26 MG Take 1 tablet by mouth twice daily 12/02/21   Satira Sark, MD  furosemide (LASIX) 20 MG tablet TAKE 1 TABLET BY MOUTH ONCE DAILY AS DIRECTED 12/02/21   Satira Sark, MD  guaiFENesin (MUCINEX) 600 MG 12 hr tablet Take 600 mg by mouth daily.    [provider]  metoprolol succinate (TOPROL-XL) 25 MG 24 hr tablet Take 1 tablet (  25 mg total) by mouth daily. Patient taking differently: Take 12.5 mg by mouth daily. 03/18/21   Verta Ellen., NP  mupirocin ointment (BACTROBAN) 2 % Apply 1 application. topically 2 (two) times daily. 10/25/21   [provider]  omeprazole (PRILOSEC) 20 MG capsule Take by mouth daily before breakfast.    [provider]  ondansetron (ZOFRAN) 4 MG tablet Take 1 tablet (4 mg total) by mouth every 6 (six) hours as needed for nausea. 11/03/21   Raiford Noble Latif, DO  repaglinide (PRANDIN) 2 MG tablet Take 2 mg by mouth 3 (three) times daily  before meals.  07/06/17   [provider]  sodium chloride (OCEAN) 0.65 % nasal spray Place 1 spray into the nose as needed for congestion.    [provider]  spironolactone (ALDACTONE) 25 MG tablet Take 0.5 tablets (12.5 mg total) by mouth daily. 04/01/21 10/19/23  Satira Sark, MD  zinc gluconate 50 MG tablet Take 50 mg by mouth daily.    [provider]      Allergies    Lisinopril    Review of Systems   Review of Systems  Constitutional:  Negative for chills and fever.  Respiratory:  Negative for shortness of breath.   Cardiovascular:  Negative for chest pain.  Musculoskeletal:  Positive for back pain.  Neurological:  Positive for numbness.  All other systems reviewed and are negative.  Physical Exam Updated Vital Signs BP 121/66   Pulse 80   Temp 98.7 F (37.1 C) (Oral)   Resp 18   SpO2 97%  Physical Exam Vitals and nursing note reviewed.  Constitutional:      General: Ricardo Zuniga is not in acute distress.    Appearance: Normal appearance. Ricardo Zuniga is not ill-appearing, toxic-appearing or diaphoretic.  HENT:     Head: Normocephalic and atraumatic.     Nose: Nose normal. No congestion.     Mouth/Throat:     Mouth: Mucous membranes are moist.     Pharynx: Oropharynx is clear.  Eyes:     General:        Right eye: No discharge.     Extraocular Movements: Extraocular movements intact.     Conjunctiva/sclera: Conjunctivae normal.     Pupils: Pupils are equal, round, and reactive to light.  Cardiovascular:     Rate and Rhythm: Normal rate and regular rhythm.  Pulmonary:     Effort: Pulmonary effort is normal.     Breath sounds: Normal breath sounds. No wheezing.  Abdominal:     General: Abdomen is flat.     Palpations: Abdomen is soft.     Tenderness: There is no abdominal tenderness.  Musculoskeletal:     Cervical back: Normal range of motion and neck supple. No tenderness.  Skin:    General: Skin is warm and dry.     Capillary Refill:  Capillary refill takes less than 2 seconds.  Neurological:     General: No focal deficit present.     Mental Status: Ricardo Zuniga is alert and oriented to person, place, and time.     GCS: GCS eye subscore is 4. GCS verbal subscore is 5. GCS motor subscore is 6.     Cranial Nerves: Cranial nerves 2-12 are intact. No cranial nerve deficit.     Sensory: Sensory deficit present.     Motor: Motor function is intact. No weakness.     Comments: 5 out of 5 strength of bilateral lower extremities.  Patient reporting  decreased sensation/numbness of feet bilaterally.    ED Results / Procedures / Treatments   Labs (all labs ordered are listed, but only abnormal results are displayed) Labs Reviewed  CBC - Abnormal; Notable for the following components:      Result Value   MCHC 36.3 (*)    All other components within normal limits  BASIC METABOLIC PANEL - Abnormal; Notable for the following components:   CO2 21 (*)    Glucose, Bld 241 (*)    All other components within normal limits  URINALYSIS, ROUTINE W REFLEX MICROSCOPIC - Abnormal; Notable for the following components:   Color, Urine STRAW (*)    Glucose, UA >=500 (*)    Ketones, ur 5 (*)    All other components within normal limits    EKG None  Radiology MR Lumbar Spine W Wo Contrast  Result Date: 12/29/2021 CLINICAL DATA:  Low back pain, cauda equina syndrome suspected EXAM: MRI LUMBAR SPINE WITHOUT AND WITH CONTRAST TECHNIQUE: Multiplanar and multiecho pulse sequences of the lumbar spine were obtained without and with intravenous contrast. CONTRAST:  8.19m GADAVIST GADOBUTROL 1 MMOL/ML IV SOLN COMPARISON:  None Available. FINDINGS: Segmentation: 4 lumbar-type vertebral bodies with sacralization of L5 and a rudimentary disc at L5-S1. Alignment: S shaped curvature of the thoracolumbar spine no significant listhesis. Vertebrae: Fracture or suspicious osseous lesion. No abnormal enhancement. Congenitally short pedicles, which narrow the AP diameter  of the spinal canal. Conus medullaris and cauda equina: Conus extends to the L1 level. Conus appears normal. Mild thickening of the cauda equina nerve roots between L3-L4 and L4-L5 (series 8, image 27) and just distal to L4-L5 (series 8, image 32). No abnormal enhancement. Paraspinal and other soft tissues: Left renal cyst. Atrophy of the inferior paraspinous muscles. Disc levels: T12-L1: No significant disc bulge. No spinal canal stenosis or neural foraminal narrowing. L1-L2: Minimal disc bulge. No spinal canal stenosis or neural foraminal narrowing. L2-L3: Minimal disc bulge with superimposed left foraminal protrusion. Ligamentum flavum hypertrophy. Narrowing of the left-greater-than-right lateral recess. Mild-to-moderate spinal canal stenosis. Mild facet arthropathy. No neural foraminal narrowing. L3-L4: Moderate disc bulge superimposed on short pedicles. Mild facet arthropathy. Ligamentum flavum hypertrophy. Severe spinal canal stenosis. Effacement of the lateral recesses. Mild-to-moderate left and mild right neural foraminal narrowing. L4-L5: Moderate disc bulge superimposed on short pedicles. Mild facet arthropathy. Ligamentum flavum hypertrophy. Severe spinal canal stenosis. Effacement of the lateral recesses. Moderate to severe left and mild right neural foraminal narrowing. L5-S1: No disc bulge. No spinal canal stenosis or neural foraminal narrowing. IMPRESSION: 1. Severe spinal canal stenosis at L3-L4, as can be seen with cauda equina syndrome, with mild thickening of the cauda equina nerve roots between L3-L4 and L4-L5 and just distal to L4-L5, concerning for edema. No abnormal signal or enhancement. 2. L4-L5 moderate to severe left and mild right neural foraminal narrowing. Effacement of the lateral recesses at this level likely compresses the descending L5 nerve roots. 3. L3-L4 mild-to-moderate left and mild right neural foraminal narrowing. Effacement of the lateral recesses at this level likely  compresses the descending L4 nerve roots. 4. L2-L3 mild-to-moderate spinal canal stenosis. Narrowing of the left-greater-than-right lateral recess at this level could the descending L3 nerve roots. 5. Transitional anatomy, with 4 lumbar-type vertebral bodies and sacralization of L5. Please correlate with imaging if any intervention is planned. These results were called by telephone at the time of interpretation on 12/29/2021 at 10:51 pm to [facility], who verbally acknowledged these results. Electronically  Signed   By: Merilyn Baba M.D.   On: 12/29/2021 22:54    Procedures Procedures    Medications Ordered in ED Medications  diazepam (VALIUM) tablet 2 mg (2 mg Oral Given 12/29/21 2058)  gadobutrol (GADAVIST) 1 MMOL/ML injection 8.5 mL (8.5 mLs Intravenous Contrast Given 12/29/21 2211)    ED Course/ Medical Decision Making/ A&P                           Medical Decision Making Amount and/or Complexity of Data Reviewed Labs: ordered. Radiology: ordered.  Risk Prescription drug management.   71 year old male presents ED for evaluation.  Please see HPI for further details.  On examination, the patient is afebrile, nontachycardic.  Patient abdomen soft and compressible in all 4 quadrants.  Patient is nonhypoxic on room air with clear lung sounds bilaterally. Patient with diffuse low back pain that is nonlocalized. Patient's neurological examination shows decreased sensation of bilateral lower extremities.  Patient has 5 out of 5 strength.  No other focal neurodeficits noted. Patient able to ambulate without difficulty.  Patient worked up utilizing the following labs and imaging studies interpreted by me personally: - Urinalysis shows glucose greater than 500, ketones in urine.  - BMP shows elevated glucose to 241 - CBC unremarkable, no elevated WBC count - MRI lumbar spine shows severe spinal canal stenosis at L3-L4, severe thickening of cauda equina nerve roots between  L3-L4 and L4-L5. No signal enhancement  At this time, consult to neurosurgery has been placed.  I am awaiting their call back.  The patient is currently ambulatory, states that Ricardo Zuniga had severe lower extremity weakness after 4 to 5 minutes of walking described as a "pressure".  Patient still endorsing bilateral numbness to feet.  Patient denies any bowel or bladder incontinence at this time.  Patient rectal tone intact.  Spoke with Dr. Venetia Constable of neurosurgery who was extremely kind and helpful.  Dr. Venetia Constable advised me that this patient can follow-up with him in the office as an outpatient this week.  Patient is amenable to plan.  Patient given return precautions and voiced understanding.  Patient had all of his questions answered to his satisfaction.  The patient is stable at this time for discharge.   Final Clinical Impression(s) / ED Diagnoses Final diagnoses:  Cauda equina compression [facility])  Spinal stenosis at L4-L5 level    Rx / DC Orders ED Discharge Orders     None                 Lawana Chambers 12/30/21 0112    Valarie Merino, MD 01/01/22 [phone]

## 2021-12-29 NOTE — ED Triage Notes (Signed)
Reports lower back pain since church this morning with numbness/tingling to bilateral legs.  States he felt like he was going to be incontinent of stool but wasn't but had numbness in hips/buttocks.  No known injury.  States he did carry a planter for his wife yesterday and cleaned outside windows but he said nothing unusual for him.

## 2021-12-30 ENCOUNTER — Telehealth: Payer: Self-pay | Admitting: *Deleted

## 2021-12-30 DIAGNOSIS — M48062 Spinal stenosis, lumbar region with neurogenic claudication: Secondary | ICD-10-CM | POA: Diagnosis not present

## 2021-12-30 NOTE — Telephone Encounter (Signed)
   Pre-operative Risk Assessment    Patient Name: Ricardo Zuniga  DOB: 1951-05-15 MRN: [id number]      Request for Surgical Clearance    Procedure:   B/L L3-4, L4-5 SUBLAMINAR DECOMPRESSION  Date of Surgery:  Clearance 01/02/22                                 Surgeon:  DR. Kristeen Miss Surgeon's Group or Practice Name:  [facility] Phone number:  [phone] Fax number:  [phone] ATTN: JESSICA   Type of Clearance Requested:   - Medical ; NO MEDICATIONS ON CLEARANCE FORM BEING REQUESTED TO BE HELD   Type of Anesthesia:  General    Additional requests/questions:    Jiles Prows   12/30/2021, 4:13 PM

## 2021-12-31 ENCOUNTER — Encounter: Payer: Self-pay | Admitting: Nurse Practitioner

## 2021-12-31 ENCOUNTER — Telehealth: Payer: Self-pay

## 2021-12-31 ENCOUNTER — Other Ambulatory Visit: Payer: Self-pay | Admitting: Neurological Surgery

## 2021-12-31 ENCOUNTER — Ambulatory Visit (INDEPENDENT_AMBULATORY_CARE_PROVIDER_SITE_OTHER): Payer: Medicare PPO | Admitting: Nurse Practitioner

## 2021-12-31 VITALS — BP 106/64 | HR 79

## 2021-12-31 DIAGNOSIS — Z01810 Encounter for preprocedural cardiovascular examination: Secondary | ICD-10-CM

## 2021-12-31 DIAGNOSIS — I10 Essential (primary) hypertension: Secondary | ICD-10-CM | POA: Diagnosis not present

## 2021-12-31 DIAGNOSIS — M48061 Spinal stenosis, lumbar region without neurogenic claudication: Secondary | ICD-10-CM | POA: Diagnosis not present

## 2021-12-31 DIAGNOSIS — Z789 Other specified health status: Secondary | ICD-10-CM | POA: Diagnosis not present

## 2021-12-31 DIAGNOSIS — I509 Heart failure, unspecified: Secondary | ICD-10-CM | POA: Diagnosis not present

## 2021-12-31 DIAGNOSIS — E1165 Type 2 diabetes mellitus with hyperglycemia: Secondary | ICD-10-CM | POA: Diagnosis not present

## 2021-12-31 DIAGNOSIS — Z299 Encounter for prophylactic measures, unspecified: Secondary | ICD-10-CM | POA: Diagnosis not present

## 2021-12-31 NOTE — Telephone Encounter (Signed)
Pt is scheduled for a virtual visit today for clearance. Consent and med rec reviewed.

## 2021-12-31 NOTE — Telephone Encounter (Signed)
Primary Cardiologist:Samuel Domenic Polite, MD  Chart reviewed as part of pre-operative protocol coverage. Because of Ricardo Zuniga's past medical history and time since last visit, he/she will require a URGENT virtual visit/telephone call in order to better assess preoperative cardiovascular risk.  Pre-op covering staff: - Please contact patient, obtain consent, and schedule appointment   If applicable, this message will also be routed to pharmacy pool and/or primary cardiologist for input on holding anticoagulant/antiplatelet agent as requested below so that this information is available at time of patient's appointment.   [name], NP-C    12/31/2021, 9:20 AM [place] [address] Office [phone] Fax [phone]

## 2021-12-31 NOTE — Progress Notes (Signed)
Virtual Visit via Telephone Note   Because of Ricardo Zuniga's co-morbid illnesses, he is at least at moderate risk for complications without adequate follow up.  This format is felt to be most appropriate for this patient at this time.  The patient did not have access to video technology/had technical difficulties with video requiring transitioning to audio format only (telephone).  All issues noted in this document were discussed and addressed.  No physical exam could be performed with this format.  Please refer to the patient's chart for his consent to telehealth for [facility].  Evaluation Performed:  Preoperative cardiovascular risk assessment _____________   Date:  12/31/2021   Patient ID:  Ricardo Zuniga, DOB July 18, 1951, MRN [id number] Patient Location:  Home Provider location:   Office  Primary Care Provider:  Glenda Chroman, MD Primary Cardiologist:  [name], MD  Chief Complaint / Patient Profile   71 y.o. y/o male with a h/o chronic combined systolic and diastolic heart failure, nonobstructive CAD by cath 09/2018, NICM, diabetes, APML in remission, who is pending lumbar sublaminar decompression and presents today for telephonic preoperative cardiovascular risk assessment.  Past Medical History    Past Medical History:  Diagnosis Date   Anal fissure    APML (acute promyelocytic leukemia) in remission ([place])    Completed treatment 04/2013   Coronary artery disease    Nonobstructive at cardiac catheterization 09/2018   Difficult intubation    Gastroesophageal reflux disease    History of kidney stones    Hypercholesteremia    Neuropathy    Nonischemic cardiomyopathy ([place])    a. EF 20% in 2015 - thought to possibly be viral induced or chemotherapy induced from treatments in 2014 b. at 35-40% by echo in 05/2016    Peptic ulcer disease    Pneumonia    Type 2 diabetes mellitus ([place])    Ureteral colic    Wears glasses    Wears hearing aid in both ears     Past Surgical History:  Procedure Laterality Date   ANAL FISSURE REPAIR     APPENDECTOMY     BIOPSY  08/17/2020   Procedure: BIOPSY;  Surgeon: Harvel Quale, MD;  [facility];  Service: Gastroenterology;;   Wilmon Pali RELEASE Left 10/16/2017   Procedure: LEFT CARPAL TUNNEL RELEASE;  Surgeon: Earlie Server, MD;  Location: [place];  Service: Orthopedics;  Laterality: Left;   CARPAL TUNNEL RELEASE Right 11/25/2019   Procedure: CARPAL TUNNEL RELEASE;  Surgeon: Earlie Server, MD;  Location: [facility];  Service: Orthopedics;  Laterality: Right;   CATARACT EXTRACTION, BILATERAL     CERVICAL DISC ARTHROPLASTY N/A 01/05/2018   Procedure: Artificial Cervical Disc ReplacementCervical Three-Four, Cervical Four-Five;  Surgeon: Kristeen Miss, MD;  Location: [place];  Service: Neurosurgery;  Laterality: N/A;   CERVICAL FUSION     CHOLECYSTECTOMY     COLONOSCOPY N/A 06/27/2015   Procedure: COLONOSCOPY;  Surgeon: Rogene Houston, MD;  [facility];  Service: Endoscopy;  Laterality: N/A;  730   COLONOSCOPY WITH PROPOFOL N/A 08/17/2020   Procedure: COLONOSCOPY WITH PROPOFOL;  Surgeon: Harvel Quale, MD;  [facility];  Service: Gastroenterology;  Laterality: N/A;  11:15   COLONOSCOPY WITH PROPOFOL N/A 10/23/2021   Procedure: COLONOSCOPY WITH PROPOFOL;  Surgeon: Harvel Quale, MD;  [facility];  Service: Gastroenterology;  Laterality: N/A;  9:45am   ESOPHAGOGASTRODUODENOSCOPY (EGD) WITH PROPOFOL N/A 08/17/2020   Procedure: ESOPHAGOGASTRODUODENOSCOPY (EGD) WITH PROPOFOL;  Surgeon: Montez Morita, Quillian Quince, MD;  [facility];  Service: Gastroenterology;  Laterality: N/A;   KNEE SURGERY     x3   LEFT AND RIGHT HEART CATHETERIZATION WITH CORONARY ANGIOGRAM N/A 12/12/2013   Procedure: LEFT AND RIGHT HEART CATHETERIZATION WITH CORONARY ANGIOGRAM;  Surgeon: [name], MD;  Location[facility] CATH LAB;  Service: Cardiovascular;   Laterality: N/A;   LEFT HEART CATH AND CORONARY ANGIOGRAPHY N/A 09/17/2018   Procedure: LEFT HEART CATH AND CORONARY ANGIOGRAPHY;  Surgeon: Wellington Hampshire, MD;  Location: [place] CV LAB;  Service: Cardiovascular;  Laterality: N/A;   LUMBAR FUSION     POLYPECTOMY  08/17/2020   Procedure: POLYPECTOMY;  Surgeon: Harvel Quale, MD;  [facility];  Service: Gastroenterology;;   POLYPECTOMY  10/23/2021   Procedure: POLYPECTOMY;  Surgeon: Harvel Quale, MD;  [facility];  Service: Gastroenterology;;   SHOULDER ACROMIOPLASTY Right 07/18/2015   Procedure: SHOULDER ACROMIOPLASTY;  Surgeon: Earlie Server, MD;  Location: [place];  Service: Orthopedics;  Laterality: Right;   SHOULDER ARTHROSCOPY Right 07/18/2015   Procedure: Right shoulder arthroscopy with debridement;  Surgeon: Earlie Server, MD;  Location: [place];  Service: Orthopedics;  Laterality: Right;   SHOULDER SURGERY     x3   TONSILLECTOMY      Allergies  Allergies  Allergen Reactions   Lisinopril Cough    History of Present Illness    Ricardo Zuniga is a 71 y.o. male who presents via audio/video conferencing for a telehealth visit today.  Pt was last seen in cardiology clinic on 04/01/21 by Dr. Domenic Polite.  At that time ISAIR INABINET was doing well.  The patient is now pending procedure as outlined above. Since his last visit, he denies chest pain, shortness of breath, lower extremity edema, fatigue, palpitations, melena, hematuria, hemoptysis, diaphoresis, weakness, presyncope, syncope, orthopnea, and PND.   Home Medications    Prior to Admission medications   Medication Sig Start Date End Date Taking? Authorizing Provider  acetaminophen (TYLENOL) 650 MG CR tablet Take 1,300 mg by mouth every 8 (eight) hours as needed for pain.    [provider]  albuterol (VENTOLIN HFA) 108 (90 Base) MCG/ACT inhaler Inhale 2 puffs into the lungs See  admin instructions. Inhale 2 puffs 4 times daily until 3/12 then 2 puffs every 6 hours as needed for shortness of breath Patient not taking: Reported on 12/31/2021    [provider]  aspirin EC 81 MG tablet Take 81 mg by mouth daily. Patient not taking: Reported on 12/31/2021    [provider]  atorvastatin (LIPITOR) 40 MG tablet TAKE 1 TABLET BY MOUTH ONCE DAILY NEEDS  OFFICE  VISIT.PLEASE  CALL  THE  OFFICE 12/30/21   Satira Sark, MD  Cholecalciferol (VITAMIN D3 PO) Take 1 tablet by mouth in the morning.    [provider]  diazepam (VALIUM) 5 MG tablet Take 5 mg by mouth daily as needed. 10/17/21   [provider]  ENTRESTO 24-26 MG Take 1 tablet by mouth twice daily 12/02/21   Satira Sark, MD  furosemide (LASIX) 20 MG tablet TAKE 1 TABLET BY MOUTH ONCE DAILY AS DIRECTED 12/02/21   Satira Sark, MD  guaiFENesin (MUCINEX) 600 MG 12 hr tablet Take 600 mg by mouth daily.    [provider]  metoprolol succinate (TOPROL-XL) 25 MG 24 hr tablet Take 1 tablet (25 mg total) by mouth daily. Patient  taking differently: Take 12.5 mg by mouth daily. 03/18/21   Verta Ellen., NP  mupirocin ointment (BACTROBAN) 2 % Apply 1 application. topically 2 (two) times daily. 10/25/21   [provider]  omeprazole (PRILOSEC) 20 MG capsule Take by mouth daily before breakfast.    [provider]  ondansetron (ZOFRAN) 4 MG tablet Take 1 tablet (4 mg total) by mouth every 6 (six) hours as needed for nausea. 11/03/21   Raiford Noble Latif, DO  repaglinide (PRANDIN) 2 MG tablet Take 2 mg by mouth 3 (three) times daily before meals.  07/06/17   [provider]  sodium chloride (OCEAN) 0.65 % nasal spray Place 1 spray into the nose as needed for congestion. Patient not taking: Reported on 12/31/2021    [provider]  spironolactone (ALDACTONE) 25 MG tablet Take 0.5 tablets (12.5 mg total) by mouth daily. 04/01/21 10/19/23   Satira Sark, MD  zinc gluconate 50 MG tablet Take 50 mg by mouth daily.    [provider]    Physical Exam    Vital Signs:  DONTARIUS SHELEY does have vital signs available for review today.  Given telephonic nature of communication, physical exam is limited. AAOx3. NAD. Normal affect.  Speech and respirations are unlabored.  Accessory Clinical Findings    None  Assessment & Plan    1.  Preoperative Cardiovascular Risk Assessment: He is doing well from a cardiac perspective and can proceed to surgery without further cardiac testing. According to the Revised Cardiac Risk Index (RCRI), his Perioperative Risk of Major Cardiac Event is (%): 6.6. His Functional Capacity in METs is: 7.59 according to the [facility] Activity Status Index (DASI).   A copy of this note will be routed to requesting surgeon.  Time:   Today, I have spent 10 minutes with the patient with telehealth technology discussing medical history, symptoms, and management plan.     [name], NP-C    12/31/2021, 10:42 AM [place] [address] Office [phone] Fax [phone]

## 2021-12-31 NOTE — Telephone Encounter (Signed)
  Patient Consent for Virtual Visit        Ricardo Zuniga has provided verbal consent on 12/31/2021 for a virtual visit (video or telephone).   CONSENT FOR VIRTUAL VISIT FOR:  Ricardo Zuniga  By participating in this virtual visit I agree to the following:  I hereby voluntarily request, consent and authorize [facility] and its employed or contracted physicians, physician assistants, nurse practitioners or other licensed health care professionals (the Practitioner), to provide me with telemedicine health care services (the "Services") as deemed necessary by the treating Practitioner. I acknowledge and consent to receive the Services by the Practitioner via telemedicine. I understand that the telemedicine visit will involve communicating with the Practitioner through live audiovisual communication technology and the disclosure of certain medical information by electronic transmission. I acknowledge that I have been given the opportunity to request an in-person assessment or other available alternative prior to the telemedicine visit and am voluntarily participating in the telemedicine visit.  I understand that I have the right to withhold or withdraw my consent to the use of telemedicine in the course of my care at any time, without affecting my right to future care or treatment, and that the Practitioner or I may terminate the telemedicine visit at any time. I understand that I have the right to inspect all information obtained and/or recorded in the course of the telemedicine visit and may receive copies of available information for a reasonable fee.  I understand that some of the potential risks of receiving the Services via telemedicine include:  Delay or interruption in medical evaluation due to technological equipment failure or disruption; Information transmitted may not be sufficient (e.g. poor resolution of images) to allow for appropriate medical decision making by the Practitioner;  and/or  In rare instances, security protocols could fail, causing a breach of personal health information.  Furthermore, I acknowledge that it is my responsibility to provide information about my medical history, conditions and care that is complete and accurate to the best of my ability. I acknowledge that Practitioner's advice, recommendations, and/or decision may be based on factors not within their control, such as incomplete or inaccurate data provided by me or distortions of diagnostic images or specimens that may result from electronic transmissions. I understand that the practice of medicine is not an exact science and that Practitioner makes no warranties or guarantees regarding treatment outcomes. I acknowledge that a copy of this consent can be made available to me via my patient portal ([facility]), or I can request a printed copy by calling the office of [facility].    I understand that my insurance will be billed for this visit.   I have read or had this consent read to me. I understand the contents of this consent, which adequately explains the benefits and risks of the Services being provided via telemedicine.  I have been provided ample opportunity to ask questions regarding this consent and the Services and have had my questions answered to my satisfaction. I give my informed consent for the services to be provided through the use of telemedicine in my medical care

## 2022-01-01 ENCOUNTER — Encounter (HOSPITAL_COMMUNITY): Payer: Self-pay | Admitting: Neurological Surgery

## 2022-01-01 NOTE — Progress Notes (Signed)
TWO VISITORS ARE ALLOWED TO COME WITH YOU AND STAY IN THE SURGICAL WAITING ROOM ONLY DURING PRE OP AND PROCEDURE DAY OF SURGERY.   Two VISITORS MAY VISIT WITH YOU AFTER SURGERY IN YOUR PRIVATE ROOM DURING VISITING HOURS ONLY!  PCP - Dr Jerene Bears Cardiologist - Dr [name] (Cardiac Clearance in [place] dated 12/31/21) Oncology - Dr Lysle Dingwall  Chest x-ray - 10/30/21 (1V) EKG - 10/30/21 Stress Test - 09/09/18 ECHO - 03/27/21 Cardiac Cath - 09/17/18  ICD Pacemaker/Loop - n/a  Sleep Study -  n/a CPAP - none  Do not take Jardiance and Prandin on the morning of surgery.  If your blood sugar is less than 70 mg/dL, you will need to treat for low blood sugar: Treat a low blood sugar (less than 70 mg/dL) with  cup of clear juice (cranberry or apple), 4 glucose tablets, OR glucose gel. Recheck blood sugar in 15 minutes after treatment (to make sure it is greater than 70 mg/dL). If your blood sugar is not greater than 70 mg/dL on recheck, call [phone] for further instructions.  Aspirin Instructions: Last dose was on 12/29/21.  ERAS: Clear liquids til 7:15 AM DOS  Anesthesia review: Yes  STOP now taking any Aspirin (unless otherwise instructed by your surgeon), Aleve, Naproxen, Ibuprofen, Motrin, Advil, Goody's, BC's, all herbal medications, fish oil, and all vitamins.   Coronavirus Screening Do you have any of the following symptoms:  Cough yes/no: No Fever (>100.51F)  yes/no: No Runny nose yes/no: No Sore throat yes/no: No Difficulty breathing/shortness of breath  yes/no: No  Have you traveled in the last 14 days and where? yes/no: No  Patient verbalized understanding of instructions that were given via phone.

## 2022-01-01 NOTE — Progress Notes (Signed)
Anesthesia Chart Review:  Case: [id number] Date/Time: 01/02/22 1001   Procedure: Bilateral L3-4 L4-5 Sublaminar decompression (Bilateral: Back) - 3C/RM 21 To Follow Dr Annette Stable   Anesthesia type: General   Pre-op diagnosis: Spinal stenosis, Lumbar region with neurogenic claudication   Location: [facility] OR ROOM 21 / [facility]   Surgeons: Kristeen Miss, MD       DISCUSSION: Patient is a 71 year old male scheduled for the above procedure.  History includes former smoker (34 pack years, quit 02/28/86), CAD (moderate non-obstructive 09/2018), non-ischemic cardiomyopathy (diagnosed 12/2013 w/ EF 20%; EF 40-45% 03/2021), DM2, hypercholesterolemia, GERD, APML (completed treatment 2014), neuropathy, hearing aids, spinal surgery (C3-5 ACDF 01/05/18), facial cellulitis (admission 10/2021 with sepsis, DKA, and also + Norovirus), DIFFICULT INTUBATION (see below).      History of difficult intubation due to anterior larynx.  S/p cervical disc replacement 01/05/2018 with general anesthesia.  Per anesthesia note, "CRNA DL X 1 with MAC 4 blade with grade IV view. Dr. Sabra Heck DL X 2 and attempt to pass bougie unsuccessful. Easy mask with oral airway. CRNA DL X 3 with Glidescope MAC 4 blade with grade I view and successful intubation."     Preoperative cardiology input on 12/31/21 by Christen Bame, NP, "Preoperative Cardiovascular Risk Assessment: He is doing well from a cardiac perspective and can proceed to surgery without further cardiac testing. According to the Revised Cardiac Risk Index (RCRI), his Perioperative Risk of Major Cardiac Event is (%): 6.6. His Functional Capacity in METs is: 7.59 according to the Duke Activity Status Index (DASI)."    He is a same day work-up, but had a CBC, BMP, UA on 12/29/21 during ED visit for back pain. His last available A1c was 8.7% in 10/2021 during admission for facial cellulitis and Norovirus. Anesthesia team to evaluate on the day of surgery.   VS:  BP Readings from Last 3 Encounters:   12/31/21 106/64  12/30/21 121/66  11/03/21 128/76   Pulse Readings from Last 3 Encounters:  12/31/21 79  12/30/21 80  11/03/21 88     PROVIDERS: Glenda Chroman, MD is PCP  [name], MD is cardiologist   LABS: Most recent lab results include: Lab Results  Component Value Date   WBC 6.7 12/29/2021   HGB 15.2 12/29/2021   HCT 41.9 12/29/2021   PLT 224 12/29/2021   GLUCOSE 241 (H) 12/29/2021   ALT 19 11/03/2021   AST 15 11/03/2021   NA 135 12/29/2021   K 3.8 12/29/2021   CL 107 12/29/2021   CREATININE 0.98 12/29/2021   BUN 16 12/29/2021   CO2 21 (L) 12/29/2021   HGBA1C 8.7 (H) 10/30/2021     IMAGES: MRI L-spine 12/29/21: IMPRESSION: 1. Severe spinal canal stenosis at L3-L4, as can be seen with cauda equina syndrome, with mild thickening of the cauda equina nerve roots between L3-L4 and L4-L5 and just distal to L4-L5, concerning for edema. No abnormal signal or enhancement. 2. L4-L5 moderate to severe left and mild right neural foraminal narrowing. Effacement of the lateral recesses at this level likely compresses the descending L5 nerve roots. 3. L3-L4 mild-to-moderate left and mild right neural foraminal narrowing. Effacement of the lateral recesses at this level likely compresses the descending L4 nerve roots. 4. L2-L3 mild-to-moderate spinal canal stenosis. Narrowing of the left-greater-than-right lateral recess at this level could the descending L3 nerve roots. 5. Transitional anatomy, with 4 lumbar-type vertebral bodies and sacralization of L5. Please correlate with imaging if any intervention is planned.  CXR 10/30/21: FINDINGS: Trachea is midline. Heart size stable. Thoracic aorta is calcified. Lungs are clear. No pleural fluid. IMPRESSION: No acute findings.   EKG: 10/30/21: Sinus tachycardia at 119 bpm Multiple ventricular premature complexes Probable left atrial enlargement Borderline T abnormalities, lateral leads Baseline wander in  lead(s) V2 Confirmed by Addison Lank [id number]) on 10/31/2021 7:43:01 PM   CV: Echo 03/27/2021 IMPRESSIONS   1. Left ventricular ejection fraction, by estimation, is 40 to 45%. The  left ventricle has mildly decreased function. The left ventricle  demonstrates global hypokinesis. Left ventricular diastolic parameters are  consistent with Grade I diastolic  dysfunction (impaired relaxation).   2. Right ventricular systolic function is normal. The right ventricular  size is normal. Tricuspid regurgitation signal is inadequate for assessing  PA pressure.   3. The mitral valve is grossly normal. No evidence of mitral valve  regurgitation. No evidence of mitral stenosis.   4. The aortic valve is tricuspid. Aortic valve regurgitation is moderate.  No aortic stenosis is present.  - Comparison echo 02/22/20: LVEF 50%, no RWMA, grade 1 DD, normal RVSF, normal PASP, mild-moderate AI. LVEF 50% on 09/02/18. - Per Dr. Domenic Polite on 03/27/21, "Results reviewed. LVEF now 40-45%, decreased mildly from last assessment. Suggest we add aldactone 12.5 mg daily to current regimen. Check BMET in 2 weeks. Should have visit coming up soon also."   Cardiac Cath 09/17/2018 (done following high risk stress test 09/09/18) There is moderate to severe left ventricular systolic dysfunction. LV end diastolic pressure is normal. Prox RCA lesion is 40% stenosed.   1.  Moderate nonobstructive one-vessel coronary artery disease.  The RCA stenosis actually appears to be better than most recent cardiac catheterization in 2015. 2.  Moderately severely reduced LV systolic function with an EF of 30%.  Global hypokinesis. 3.  Mild left ventricular end-diastolic pressure.   Recommendations: The patient has nonischemic cardiomyopathy for which I recommend continuing medical therapy.  He appears to be euvolemic.  Consider switching losartan to Entresto and adding spironolactone.    Past Medical History:  Diagnosis Date   Anal  fissure    APML (acute promyelocytic leukemia) in remission ([place])    Completed treatment 04/2013   Coronary artery disease    Nonobstructive at cardiac catheterization 09/2018   Difficult intubation    Gastroesophageal reflux disease    History of kidney stones    Hypercholesteremia    Neuropathy    Nonischemic cardiomyopathy ([place])    a. EF 20% in 2015 - thought to possibly be viral induced or chemotherapy induced from treatments in 2014 b. at 35-40% by echo in 05/2016    Peptic ulcer disease    Pneumonia    Type 2 diabetes mellitus ([place])    Ureteral colic    Wears glasses    Wears hearing aid in both ears     Past Surgical History:  Procedure Laterality Date   ANAL FISSURE REPAIR     APPENDECTOMY     BIOPSY  08/17/2020   Procedure: BIOPSY;  Surgeon: Harvel Quale, MD;  [facility]: [facility];  Service: Gastroenterology;;   Wilmon Pali RELEASE Left 10/16/2017   Procedure: LEFT CARPAL TUNNEL RELEASE;  Surgeon: Earlie Server, MD;  Location: [facility];  Service: Orthopedics;  Laterality: Left;   CARPAL TUNNEL RELEASE Right 11/25/2019   Procedure: CARPAL TUNNEL RELEASE;  Surgeon: Earlie Server, MD;  Location: [facility];  Service: Orthopedics;  Laterality: Right;   CATARACT EXTRACTION, BILATERAL     CERVICAL  DISC ARTHROPLASTY N/A 01/05/2018   Procedure: Artificial Cervical Disc ReplacementCervical Three-Four, Cervical Four-Five;  Surgeon: Kristeen Miss, MD;  Location: [facility];  Service: Neurosurgery;  Laterality: N/A;   CERVICAL FUSION     CHOLECYSTECTOMY     COLONOSCOPY N/A 06/27/2015   Procedure: COLONOSCOPY;  Surgeon: Rogene Houston, MD;  [facility];  Service: Endoscopy;  Laterality: N/A;  730   COLONOSCOPY WITH PROPOFOL N/A 08/17/2020   Procedure: COLONOSCOPY WITH PROPOFOL;  Surgeon: Harvel Quale, MD;  [facility];  Service: Gastroenterology;  Laterality: N/A;  11:15   COLONOSCOPY WITH PROPOFOL N/A 10/23/2021   Procedure: COLONOSCOPY WITH  PROPOFOL;  Surgeon: Harvel Quale, MD;  [facility];  Service: Gastroenterology;  Laterality: N/A;  9:45am   ESOPHAGOGASTRODUODENOSCOPY (EGD) WITH PROPOFOL N/A 08/17/2020   Procedure: ESOPHAGOGASTRODUODENOSCOPY (EGD) WITH PROPOFOL;  Surgeon: Harvel Quale, MD;  [facility];  Service: Gastroenterology;  Laterality: N/A;   KNEE SURGERY     x3   LEFT AND RIGHT HEART CATHETERIZATION WITH CORONARY ANGIOGRAM N/A 12/12/2013   Procedure: LEFT AND RIGHT HEART CATHETERIZATION WITH CORONARY ANGIOGRAM;  Surgeon: [name], MD;  Location: [facility] CATH LAB;  Service: Cardiovascular;  Laterality: N/A;   LEFT HEART CATH AND CORONARY ANGIOGRAPHY N/A 09/17/2018   Procedure: LEFT HEART CATH AND CORONARY ANGIOGRAPHY;  Surgeon: Wellington Hampshire, MD;  Location: [facility] CV LAB;  Service: Cardiovascular;  Laterality: N/A;   LUMBAR FUSION     POLYPECTOMY  08/17/2020   Procedure: POLYPECTOMY;  Surgeon: Harvel Quale, MD;  [facility];  Service: Gastroenterology;;   POLYPECTOMY  10/23/2021   Procedure: POLYPECTOMY;  Surgeon: Harvel Quale, MD;  [facility];  Service: Gastroenterology;;   SHOULDER ACROMIOPLASTY Right 07/18/2015   Procedure: SHOULDER ACROMIOPLASTY;  Surgeon: Earlie Server, MD;  Location: [place];  Service: Orthopedics;  Laterality: Right;   SHOULDER ARTHROSCOPY Right 07/18/2015   Procedure: Right shoulder arthroscopy with debridement;  Surgeon: Earlie Server, MD;  Location: [place];  Service: Orthopedics;  Laterality: Right;   SHOULDER SURGERY     x3   TONSILLECTOMY      MEDICATIONS: No current facility-administered medications for this encounter.    atorvastatin (LIPITOR) 40 MG tablet   Cholecalciferol (VITAMIN D3) 125 MCG (5000 UT) TABS   diazepam (VALIUM) 5 MG tablet   ENTRESTO 24-26 MG   furosemide (LASIX) 20 MG tablet   guaiFENesin (MUCINEX) 600 MG 12 hr tablet    JARDIANCE 10 MG TABS tablet   metoprolol succinate (TOPROL-XL) 25 MG 24 hr tablet   omeprazole (PRILOSEC) 20 MG capsule   repaglinide (PRANDIN) 2 MG tablet   spironolactone (ALDACTONE) 25 MG tablet   zinc gluconate 50 MG tablet   acetaminophen (TYLENOL) 650 MG CR tablet   aspirin EC 81 MG tablet   naproxen sodium (ALEVE) 220 MG tablet   ondansetron (ZOFRAN) 4 MG tablet    Myra Gianotti, PA-C Surgical Short Stay/Anesthesiology [facility] Phone [phone] [facility] Phone [phone] 01/01/2022 4:15 PM

## 2022-01-01 NOTE — Anesthesia Preprocedure Evaluation (Addendum)
Anesthesia Evaluation  Patient identified by MRN, date of birth, ID band Patient awake    Reviewed: Allergy & Precautions, NPO status , Patient's Chart, lab work & pertinent test results, reviewed documented beta blocker date and time   History of Anesthesia Complications (+) DIFFICULT AIRWAY and history of anesthetic complications  Airway Mallampati: II  TM Distance: <3 FB Neck ROM: Limited    Dental  (+) Teeth Intact, Dental Advisory Given   Pulmonary former smoker,    Pulmonary exam normal breath sounds clear to auscultation       Cardiovascular hypertension, Pt. on medications and Pt. on home beta blockers + angina + CAD  Normal cardiovascular exam Rhythm:Regular Rate:Normal  Echo 8/22: 1. Left ventricular ejection fraction, by estimation, is 40 to 45%. The  left ventricle has mildly decreased function. The left ventricle  demonstrates global hypokinesis. Left ventricular diastolic parameters are  consistent with Grade I diastolic  dysfunction (impaired relaxation).  2. Right ventricular systolic function is normal. The right ventricular  size is normal. Tricuspid regurgitation signal is inadequate for assessing  PA pressure.  3. The mitral valve is grossly normal. No evidence of mitral valve  regurgitation. No evidence of mitral stenosis.  4. The aortic valve is tricuspid. Aortic valve regurgitation is moderate.  No aortic stenosis is present.    Neuro/Psych Spinal stenosis  negative psych ROS   GI/Hepatic Neg liver ROS, PUD, GERD  Medicated,  Endo/Other  diabetes, Type 2, Oral Hypoglycemic Agents  Renal/GU negative Renal ROS     Musculoskeletal  (+) Arthritis ,   Abdominal   Peds  Hematology negative hematology ROS (+)   Anesthesia Other Findings Day of surgery medications reviewed with the patient.  Reproductive/Obstetrics                          Anesthesia  Physical Anesthesia Plan  ASA: 3  Anesthesia Plan: General   Post-op Pain Management: Tylenol PO (pre-op)*   Induction: Intravenous  PONV Risk Score and Plan: 2 and Dexamethasone and Ondansetron  Airway Management Planned: Oral ETT and Video Laryngoscope Planned  Additional Equipment:   Intra-op Plan:   Post-operative Plan: Extubation in OR  Informed Consent: I have reviewed the patients History and Physical, chart, labs and discussed the procedure including the risks, benefits and alternatives for the proposed anesthesia with the patient or authorized representative who has indicated his/her understanding and acceptance.     Dental advisory given  Plan Discussed with: CRNA  Anesthesia Plan Comments: (PAT note written 01/01/2022 by Myra Gianotti, PA-C. )       Anesthesia Quick Evaluation

## 2022-01-02 ENCOUNTER — Encounter (HOSPITAL_COMMUNITY): Payer: Self-pay | Admitting: Neurological Surgery

## 2022-01-02 ENCOUNTER — Ambulatory Visit (HOSPITAL_BASED_OUTPATIENT_CLINIC_OR_DEPARTMENT_OTHER): Payer: Medicare PPO | Admitting: Physician Assistant

## 2022-01-02 ENCOUNTER — Encounter (HOSPITAL_COMMUNITY)
Admission: RE | Disposition: A | Discharge status: Home / Self Care | Payer: Self-pay | Source: Home / Self Care | Attending: Neurological Surgery

## 2022-01-02 ENCOUNTER — Ambulatory Visit (HOSPITAL_COMMUNITY): Payer: Medicare PPO

## 2022-01-02 ENCOUNTER — Other Ambulatory Visit: Payer: Self-pay

## 2022-01-02 ENCOUNTER — Ambulatory Visit (HOSPITAL_COMMUNITY)
Admission: RE | Admit: 2022-01-02 | Discharge: 2022-01-03 | Disposition: A | Discharge status: Home / Self Care | Payer: Medicare PPO | Attending: Neurological Surgery | Admitting: Neurological Surgery

## 2022-01-02 ENCOUNTER — Ambulatory Visit (HOSPITAL_COMMUNITY): Payer: Medicare PPO | Admitting: Physician Assistant

## 2022-01-02 DIAGNOSIS — I2510 Atherosclerotic heart disease of native coronary artery without angina pectoris: Secondary | ICD-10-CM | POA: Insufficient documentation

## 2022-01-02 DIAGNOSIS — Z79899 Other long term (current) drug therapy: Secondary | ICD-10-CM | POA: Insufficient documentation

## 2022-01-02 DIAGNOSIS — M1990 Unspecified osteoarthritis, unspecified site: Secondary | ICD-10-CM | POA: Diagnosis not present

## 2022-01-02 DIAGNOSIS — E119 Type 2 diabetes mellitus without complications: Secondary | ICD-10-CM | POA: Diagnosis not present

## 2022-01-02 DIAGNOSIS — I10 Essential (primary) hypertension: Secondary | ICD-10-CM

## 2022-01-02 DIAGNOSIS — M48062 Spinal stenosis, lumbar region with neurogenic claudication: Secondary | ICD-10-CM

## 2022-01-02 DIAGNOSIS — Z7984 Long term (current) use of oral hypoglycemic drugs: Secondary | ICD-10-CM | POA: Diagnosis not present

## 2022-01-02 DIAGNOSIS — M5416 Radiculopathy, lumbar region: Secondary | ICD-10-CM | POA: Insufficient documentation

## 2022-01-02 DIAGNOSIS — I25119 Atherosclerotic heart disease of native coronary artery with unspecified angina pectoris: Secondary | ICD-10-CM

## 2022-01-02 DIAGNOSIS — E1110 Type 2 diabetes mellitus with ketoacidosis without coma: Secondary | ICD-10-CM

## 2022-01-02 DIAGNOSIS — Z981 Arthrodesis status: Secondary | ICD-10-CM | POA: Diagnosis not present

## 2022-01-02 DIAGNOSIS — Z87891 Personal history of nicotine dependence: Secondary | ICD-10-CM | POA: Insufficient documentation

## 2022-01-02 DIAGNOSIS — K219 Gastro-esophageal reflux disease without esophagitis: Secondary | ICD-10-CM | POA: Insufficient documentation

## 2022-01-02 DIAGNOSIS — I082 Rheumatic disorders of both aortic and tricuspid valves: Secondary | ICD-10-CM | POA: Diagnosis not present

## 2022-01-02 HISTORY — DX: Dizziness and giddiness: R42

## 2022-01-02 HISTORY — PX: LUMBAR LAMINECTOMY/DECOMPRESSION MICRODISCECTOMY: SHX5026

## 2022-01-02 LAB — GLUCOSE, CAPILLARY
Glucose-Capillary: 146 mg/dL — ABNORMAL HIGH (ref 70–99)
Glucose-Capillary: 130 mg/dL — ABNORMAL HIGH (ref 70–99)
Glucose-Capillary: 143 mg/dL — ABNORMAL HIGH (ref 70–99)
Glucose-Capillary: 151 mg/dL — ABNORMAL HIGH (ref 70–99)
Glucose-Capillary: 152 mg/dL — ABNORMAL HIGH (ref 70–99)
Glucose-Capillary: 273 mg/dL — ABNORMAL HIGH (ref 70–99)

## 2022-01-02 LAB — SURGICAL PCR SCREEN
MRSA, PCR: NEGATIVE
Staphylococcus aureus: NEGATIVE

## 2022-01-02 SURGERY — LUMBAR LAMINECTOMY/DECOMPRESSION MICRODISCECTOMY 2 LEVELS
Anesthesia: General | Site: Back | Laterality: Bilateral

## 2022-01-02 MED ORDER — INSULIN ASPART 100 UNIT/ML IJ SOLN
0.0000 [IU] | Freq: Every day | INTRAMUSCULAR | Status: DC
  Administered 2022-01-02: 3 [IU] via SUBCUTANEOUS

## 2022-01-02 MED ORDER — PANTOPRAZOLE SODIUM 40 MG PO TBEC
80.0000 mg | DELAYED_RELEASE_TABLET | Freq: Every day | ORAL | Status: DC
Start: 2022-01-02 — End: 2022-01-03
  Administered 2022-01-02: 80 mg via ORAL
  Filled 2022-01-02: qty 2

## 2022-01-02 MED ORDER — SACUBITRIL-VALSARTAN 24-26 MG PO TABS
1.0000 | ORAL_TABLET | Freq: Two times a day (BID) | ORAL | Status: DC
  Administered 2022-01-02: 1 via ORAL
  Filled 2022-01-02 (×3): qty 1

## 2022-01-02 MED ORDER — SODIUM CHLORIDE 0.9 % IV SOLN
250.0000 mL | INTRAVENOUS | Status: DC
  Administered 2022-01-02: 250 mL via INTRAVENOUS

## 2022-01-02 MED ORDER — FUROSEMIDE 20 MG PO TABS
20.0000 mg | ORAL_TABLET | Freq: Every day | ORAL | Status: DC
Start: 2022-01-03 — End: 2022-01-03

## 2022-01-02 MED ORDER — PROPOFOL 10 MG/ML IV BOLUS
INTRAVENOUS | Status: DC | PRN
  Administered 2022-01-02: 120 mg via INTRAVENOUS
  Administered 2022-01-02: 30 mg via INTRAVENOUS

## 2022-01-02 MED ORDER — FENTANYL CITRATE (PF) 100 MCG/2ML IJ SOLN: 25.0000 ug | INTRAMUSCULAR | Status: DC | PRN

## 2022-01-02 MED ORDER — POLYETHYLENE GLYCOL 3350 17 G PO PACK: 17.0000 g | PACK | Freq: Every day | ORAL | Status: DC | PRN

## 2022-01-02 MED ORDER — CEFAZOLIN SODIUM-DEXTROSE 2-3 GM-%(50ML) IV SOLR
INTRAVENOUS | Status: DC | PRN
  Administered 2022-01-02: 2 g via INTRAVENOUS

## 2022-01-02 MED ORDER — MENTHOL 3 MG MT LOZG: 1.0000 | LOZENGE | OROMUCOSAL | Status: DC | PRN

## 2022-01-02 MED ORDER — SUGAMMADEX SODIUM 200 MG/2ML IV SOLN
INTRAVENOUS | Status: DC | PRN
Start: 2022-01-02 — End: 2022-01-02
  Administered 2022-01-02: 200 mg via INTRAVENOUS

## 2022-01-02 MED ORDER — PHENOL 1.4 % MT LIQD: 1.0000 | OROMUCOSAL | Status: DC | PRN

## 2022-01-02 MED ORDER — LIDOCAINE-EPINEPHRINE 1 %-1:100000 IJ SOLN
INTRAMUSCULAR | Status: DC | PRN
  Administered 2022-01-02: 5 mL

## 2022-01-02 MED ORDER — ONDANSETRON HCL 4 MG/2ML IJ SOLN: 4.0000 mg | Freq: Once | INTRAMUSCULAR | Status: DC | PRN

## 2022-01-02 MED ORDER — THROMBIN 5000 UNITS EX SOLR
CUTANEOUS | Status: AC
  Filled 2022-01-02: qty 10000

## 2022-01-02 MED ORDER — ACETAMINOPHEN 500 MG PO TABS
1000.0000 mg | ORAL_TABLET | Freq: Once | ORAL | Status: AC
Start: 2022-01-02 — End: 2022-01-02
  Administered 2022-01-02: 1000 mg via ORAL
  Filled 2022-01-02: qty 2

## 2022-01-02 MED ORDER — METHOCARBAMOL 1000 MG/10ML IJ SOLN: 500.0000 mg | Freq: Four times a day (QID) | INTRAVENOUS | Status: DC | PRN

## 2022-01-02 MED ORDER — SODIUM CHLORIDE 0.9% FLUSH
3.0000 mL | Freq: Two times a day (BID) | INTRAVENOUS | Status: DC
  Administered 2022-01-02 (×2): 3 mL via INTRAVENOUS

## 2022-01-02 MED ORDER — MORPHINE SULFATE (PF) 2 MG/ML IV SOLN: 2.0000 mg | INTRAVENOUS | Status: DC | PRN

## 2022-01-02 MED ORDER — DOCUSATE SODIUM 100 MG PO CAPS
100.0000 mg | ORAL_CAPSULE | Freq: Two times a day (BID) | ORAL | Status: DC
  Administered 2022-01-02: 100 mg via ORAL
  Filled 2022-01-02: qty 1

## 2022-01-02 MED ORDER — LACTATED RINGERS IV SOLN: INTRAVENOUS | Status: DC

## 2022-01-02 MED ORDER — ONDANSETRON HCL 4 MG/2ML IJ SOLN
INTRAMUSCULAR | Status: DC | PRN
  Administered 2022-01-02: 4 mg via INTRAVENOUS

## 2022-01-02 MED ORDER — FENTANYL CITRATE (PF) 250 MCG/5ML IJ SOLN
INTRAMUSCULAR | Status: DC | PRN
Start: 2022-01-02 — End: 2022-01-02
  Administered 2022-01-02: 50 ug via INTRAVENOUS
  Administered 2022-01-02: 100 ug via INTRAVENOUS

## 2022-01-02 MED ORDER — DIAZEPAM 5 MG PO TABS: 5.0000 mg | ORAL_TABLET | Freq: Every day | ORAL | Status: DC | PRN

## 2022-01-02 MED ORDER — PHENYLEPHRINE HCL-NACL 20-0.9 MG/250ML-% IV SOLN
INTRAVENOUS | Status: DC | PRN
  Administered 2022-01-02: 25 ug/min via INTRAVENOUS

## 2022-01-02 MED ORDER — SENNA 8.6 MG PO TABS
1.0000 | ORAL_TABLET | Freq: Two times a day (BID) | ORAL | Status: DC
  Administered 2022-01-02: 8.6 mg via ORAL
  Filled 2022-01-02: qty 1

## 2022-01-02 MED ORDER — PHENYLEPHRINE 80 MCG/ML (10ML) SYRINGE FOR IV PUSH (FOR BLOOD PRESSURE SUPPORT)
PREFILLED_SYRINGE | INTRAVENOUS | Status: AC
  Filled 2022-01-02: qty 10

## 2022-01-02 MED ORDER — LIDOCAINE 2% (20 MG/ML) 5 ML SYRINGE
INTRAMUSCULAR | Status: DC | PRN
  Administered 2022-01-02: 80 mg via INTRAVENOUS

## 2022-01-02 MED ORDER — ATORVASTATIN CALCIUM 40 MG PO TABS
40.0000 mg | ORAL_TABLET | Freq: Every evening | ORAL | Status: DC
  Administered 2022-01-02: 40 mg via ORAL
  Filled 2022-01-02: qty 1

## 2022-01-02 MED ORDER — EPHEDRINE 5 MG/ML INJ
INTRAVENOUS | Status: AC
  Filled 2022-01-02: qty 5

## 2022-01-02 MED ORDER — BUPIVACAINE HCL (PF) 0.5 % IJ SOLN
INTRAMUSCULAR | Status: AC
  Filled 2022-01-02: qty 30

## 2022-01-02 MED ORDER — METOPROLOL SUCCINATE 12.5 MG HALF TABLET
12.5000 mg | ORAL_TABLET | Freq: Every day | ORAL | Status: DC
  Filled 2022-01-02: qty 1

## 2022-01-02 MED ORDER — BUPIVACAINE HCL (PF) 0.5 % IJ SOLN
INTRAMUSCULAR | Status: DC | PRN
  Administered 2022-01-02: 30 mL

## 2022-01-02 MED ORDER — ROCURONIUM BROMIDE 10 MG/ML (PF) SYRINGE
PREFILLED_SYRINGE | INTRAVENOUS | Status: AC
  Filled 2022-01-02: qty 10

## 2022-01-02 MED ORDER — BISACODYL 10 MG RE SUPP: 10.0000 mg | Freq: Every day | RECTAL | Status: DC | PRN

## 2022-01-02 MED ORDER — CHLORHEXIDINE GLUCONATE 0.12 % MT SOLN
15.0000 mL | Freq: Once | OROMUCOSAL | Status: AC
  Administered 2022-01-02: 15 mL via OROMUCOSAL
  Filled 2022-01-02: qty 15

## 2022-01-02 MED ORDER — INSULIN ASPART 100 UNIT/ML IJ SOLN
0.0000 [IU] | Freq: Three times a day (TID) | INTRAMUSCULAR | Status: DC
  Administered 2022-01-03: 7 [IU] via SUBCUTANEOUS

## 2022-01-02 MED ORDER — SPIRONOLACTONE 12.5 MG HALF TABLET
12.5000 mg | ORAL_TABLET | Freq: Every day | ORAL | Status: DC
  Filled 2022-01-02 (×2): qty 1

## 2022-01-02 MED ORDER — INSULIN ASPART 100 UNIT/ML IJ SOLN
0.0000 [IU] | Freq: Three times a day (TID) | INTRAMUSCULAR | Status: DC
  Administered 2022-01-02: 4 [IU] via SUBCUTANEOUS

## 2022-01-02 MED ORDER — ONDANSETRON HCL 4 MG/2ML IJ SOLN
INTRAMUSCULAR | Status: AC
  Filled 2022-01-02: qty 2

## 2022-01-02 MED ORDER — ACETAMINOPHEN 650 MG RE SUPP: 650.0000 mg | RECTAL | Status: DC | PRN

## 2022-01-02 MED ORDER — ONDANSETRON HCL 4 MG/2ML IJ SOLN: 4.0000 mg | Freq: Four times a day (QID) | INTRAMUSCULAR | Status: DC | PRN

## 2022-01-02 MED ORDER — GUAIFENESIN ER 600 MG PO TB12: 600.0000 mg | ORAL_TABLET | Freq: Every day | ORAL | Status: DC | PRN

## 2022-01-02 MED ORDER — ACETAMINOPHEN 325 MG PO TABS: 650.0000 mg | ORAL_TABLET | ORAL | Status: DC | PRN

## 2022-01-02 MED ORDER — THROMBIN 5000 UNITS EX SOLR
OROMUCOSAL | Status: DC | PRN
  Administered 2022-01-02: 5 mL

## 2022-01-02 MED ORDER — REPAGLINIDE 2 MG PO TABS
2.0000 mg | ORAL_TABLET | Freq: Three times a day (TID) | ORAL | Status: DC
  Filled 2022-01-02 (×3): qty 1

## 2022-01-02 MED ORDER — FLEET ENEMA 7-19 GM/118ML RE ENEM: 1.0000 | ENEMA | Freq: Once | RECTAL | Status: DC | PRN

## 2022-01-02 MED ORDER — EMPAGLIFLOZIN 10 MG PO TABS: 10.0000 mg | ORAL_TABLET | Freq: Every day | ORAL | Status: DC

## 2022-01-02 MED ORDER — INSULIN ASPART 100 UNIT/ML IJ SOLN: 0.0000 [IU] | INTRAMUSCULAR | Status: DC | PRN

## 2022-01-02 MED ORDER — ORAL CARE MOUTH RINSE: 15.0000 mL | Freq: Once | OROMUCOSAL | Status: AC

## 2022-01-02 MED ORDER — DEXAMETHASONE SODIUM PHOSPHATE 10 MG/ML IJ SOLN
INTRAMUSCULAR | Status: DC | PRN
Start: 2022-01-02 — End: 2022-01-02
  Administered 2022-01-02: 10 mg via INTRAVENOUS

## 2022-01-02 MED ORDER — CEFAZOLIN SODIUM-DEXTROSE 2-4 GM/100ML-% IV SOLN
INTRAVENOUS | Status: AC
  Filled 2022-01-02: qty 100

## 2022-01-02 MED ORDER — DEXAMETHASONE SODIUM PHOSPHATE 10 MG/ML IJ SOLN
INTRAMUSCULAR | Status: AC
  Filled 2022-01-02: qty 1

## 2022-01-02 MED ORDER — 0.9 % SODIUM CHLORIDE (POUR BTL) OPTIME
TOPICAL | Status: DC | PRN
  Administered 2022-01-02: 1000 mL

## 2022-01-02 MED ORDER — OXYCODONE-ACETAMINOPHEN 5-325 MG PO TABS
1.0000 | ORAL_TABLET | ORAL | Status: DC | PRN
  Filled 2022-01-02: qty 2

## 2022-01-02 MED ORDER — ONDANSETRON HCL 4 MG PO TABS: 4.0000 mg | ORAL_TABLET | Freq: Four times a day (QID) | ORAL | Status: DC | PRN

## 2022-01-02 MED ORDER — LIDOCAINE-EPINEPHRINE 1 %-1:100000 IJ SOLN
INTRAMUSCULAR | Status: AC
  Filled 2022-01-02: qty 1

## 2022-01-02 MED ORDER — ACETAMINOPHEN 500 MG PO TABS
1000.0000 mg | ORAL_TABLET | Freq: Four times a day (QID) | ORAL | Status: DC
Start: 2022-01-02 — End: 2022-01-03
  Administered 2022-01-02 – 2022-01-03 (×3): 1000 mg via ORAL
  Filled 2022-01-02 (×3): qty 2

## 2022-01-02 MED ORDER — CEFAZOLIN SODIUM-DEXTROSE 2-4 GM/100ML-% IV SOLN
2.0000 g | Freq: Three times a day (TID) | INTRAVENOUS | Status: AC
  Administered 2022-01-02 – 2022-01-03 (×2): 2 g via INTRAVENOUS
  Filled 2022-01-02 (×2): qty 100

## 2022-01-02 MED ORDER — PROPOFOL 10 MG/ML IV BOLUS
INTRAVENOUS | Status: AC
  Filled 2022-01-02: qty 20

## 2022-01-02 MED ORDER — FENTANYL CITRATE (PF) 250 MCG/5ML IJ SOLN
INTRAMUSCULAR | Status: AC
  Filled 2022-01-02: qty 5

## 2022-01-02 MED ORDER — ROCURONIUM BROMIDE 10 MG/ML (PF) SYRINGE
PREFILLED_SYRINGE | INTRAVENOUS | Status: DC | PRN
  Administered 2022-01-02: 50 mg via INTRAVENOUS

## 2022-01-02 MED ORDER — LIDOCAINE 2% (20 MG/ML) 5 ML SYRINGE
INTRAMUSCULAR | Status: AC
  Filled 2022-01-02: qty 5

## 2022-01-02 MED ORDER — SODIUM CHLORIDE 0.9% FLUSH: 3.0000 mL | INTRAVENOUS | Status: DC | PRN

## 2022-01-02 MED ORDER — PHENYLEPHRINE 80 MCG/ML (10ML) SYRINGE FOR IV PUSH (FOR BLOOD PRESSURE SUPPORT)
PREFILLED_SYRINGE | INTRAVENOUS | Status: DC | PRN
  Administered 2022-01-02 (×5): 160 ug via INTRAVENOUS
  Administered 2022-01-02: 80 ug via INTRAVENOUS

## 2022-01-02 MED ORDER — EPHEDRINE SULFATE-NACL 50-0.9 MG/10ML-% IV SOSY
PREFILLED_SYRINGE | INTRAVENOUS | Status: DC | PRN
  Administered 2022-01-02: 5 mg via INTRAVENOUS

## 2022-01-02 MED ORDER — METHOCARBAMOL 500 MG PO TABS
500.0000 mg | ORAL_TABLET | Freq: Four times a day (QID) | ORAL | Status: DC | PRN
  Administered 2022-01-02 – 2022-01-03 (×3): 500 mg via ORAL
  Filled 2022-01-02 (×3): qty 1

## 2022-01-02 MED ORDER — REPAGLINIDE 1 MG PO TABS
2.0000 mg | ORAL_TABLET | Freq: Three times a day (TID) | ORAL | Status: DC
Start: 2022-01-02 — End: 2022-01-03
  Administered 2022-01-02 – 2022-01-03 (×2): 2 mg via ORAL
  Filled 2022-01-02 (×4): qty 2

## 2022-01-02 SURGICAL SUPPLY — 46 items
BAG COUNTER SPONGE SURGICOUNT (BAG) ×2 IMPLANT
BAND RUBBER #18 3X1/16 STRL (MISCELLANEOUS) IMPLANT
BLADE CLIPPER SURG (BLADE) IMPLANT
BUR ACORN 6.0 (BURR) IMPLANT
BUR MATCHSTICK NEURO 3.0 LAGG (BURR) ×2 IMPLANT
CANISTER SUCT 3000ML PPV (MISCELLANEOUS) ×2 IMPLANT
DECANTER SPIKE VIAL GLASS SM (MISCELLANEOUS) ×2 IMPLANT
DERMABOND ADVANCED (GAUZE/BANDAGES/DRESSINGS) ×1
DERMABOND ADVANCED .7 DNX12 (GAUZE/BANDAGES/DRESSINGS) ×1 IMPLANT
DEVICE DISSECT PLASMABLAD 3.0S (MISCELLANEOUS) IMPLANT
DRAPE HALF SHEET 40X57 (DRAPES) IMPLANT
DRAPE LAPAROTOMY 100X72X124 (DRAPES) ×2 IMPLANT
DRAPE MICROSCOPE LEICA (MISCELLANEOUS) IMPLANT
DRSG OPSITE POSTOP 4X6 (GAUZE/BANDAGES/DRESSINGS) ×1 IMPLANT
DURAPREP 26ML APPLICATOR (WOUND CARE) ×2 IMPLANT
ELECT REM PT RETURN 9FT ADLT (ELECTROSURGICAL) ×2
ELECTRODE REM PT RTRN 9FT ADLT (ELECTROSURGICAL) ×1 IMPLANT
GAUZE 4X4 16PLY ~~LOC~~+RFID DBL (SPONGE) IMPLANT
GAUZE SPONGE 4X4 12PLY STRL (GAUZE/BANDAGES/DRESSINGS) ×2 IMPLANT
GLOVE BIOGEL PI IND STRL 8.5 (GLOVE) ×1 IMPLANT
GLOVE BIOGEL PI INDICATOR 8.5 (GLOVE) ×1
GLOVE ECLIPSE 8.5 STRL (GLOVE) ×2 IMPLANT
GOWN STRL REUS W/ TWL LRG LVL3 (GOWN DISPOSABLE) IMPLANT
GOWN STRL REUS W/ TWL XL LVL3 (GOWN DISPOSABLE) IMPLANT
GOWN STRL REUS W/TWL 2XL LVL3 (GOWN DISPOSABLE) ×2 IMPLANT
GOWN STRL REUS W/TWL LRG LVL3 (GOWN DISPOSABLE)
GOWN STRL REUS W/TWL XL LVL3 (GOWN DISPOSABLE)
KIT BASIN OR (CUSTOM PROCEDURE TRAY) ×2 IMPLANT
KIT TURNOVER KIT B (KITS) ×2 IMPLANT
NDL SPNL 20GX3.5 QUINCKE YW (NEEDLE) IMPLANT
NEEDLE HYPO 22GX1.5 SAFETY (NEEDLE) ×2 IMPLANT
NEEDLE SPNL 20GX3.5 QUINCKE YW (NEEDLE) IMPLANT
NS IRRIG 1000ML POUR BTL (IV SOLUTION) ×2 IMPLANT
PACK LAMINECTOMY NEURO (CUSTOM PROCEDURE TRAY) ×2 IMPLANT
PAD ARMBOARD 7.5X6 YLW CONV (MISCELLANEOUS) ×6 IMPLANT
PATTIES SURGICAL .5 X1 (DISPOSABLE) ×2 IMPLANT
PLASMABLADE 3.0S (MISCELLANEOUS) ×2
SPONGE SURGIFOAM ABS GEL SZ50 (HEMOSTASIS) ×2 IMPLANT
SUT VIC AB 1 CT1 18XBRD ANBCTR (SUTURE) ×1 IMPLANT
SUT VIC AB 1 CT1 8-18 (SUTURE) ×2
SUT VIC AB 2-0 CP2 18 (SUTURE) ×2 IMPLANT
SUT VIC AB 3-0 SH 8-18 (SUTURE) ×2 IMPLANT
SUT VIC AB 4-0 RB1 18 (SUTURE) ×2 IMPLANT
TOWEL GREEN STERILE (TOWEL DISPOSABLE) ×2 IMPLANT
TOWEL GREEN STERILE FF (TOWEL DISPOSABLE) ×2 IMPLANT
WATER STERILE IRR 1000ML POUR (IV SOLUTION) ×2 IMPLANT

## 2022-01-02 NOTE — Transfer of Care (Signed)
Immediate Anesthesia Transfer of Care Note  Patient: Ricardo Zuniga  Procedure(s) Performed: Bilateral Lumbar Three-Four/Lumbar Four-Five Sublaminar decompression (Bilateral: Back)  Patient Location: PACU  Anesthesia Type:General  Level of Consciousness: oriented, sedated and patient cooperative  Airway & Oxygen Therapy: Patient Spontanous Breathing and Patient connected to face mask oxygen  Post-op Assessment: Report given to RN and Post -op Vital signs reviewed and stable  Post vital signs: Reviewed  Last Vitals:  Vitals Value Taken Time  BP 99/59 01/02/22 1347  Temp    Pulse 80 01/02/22 1352  Resp 18 01/02/22 1352  SpO2 100 % 01/02/22 1352  Vitals shown include unvalidated device data.  Last Pain:  Vitals:   01/02/22 0828  TempSrc:   PainSc: 5       Patients Stated Pain Goal: 0 (06/13/14 9458)  Complications: No notable events documented.

## 2022-01-02 NOTE — Anesthesia Postprocedure Evaluation (Signed)
Anesthesia Post Note  Patient: Ricardo Zuniga  Procedure(s) Performed: Bilateral Lumbar Three-Four/Lumbar Four-Five Sublaminar decompression (Bilateral: Back)     Patient location during evaluation: PACU Anesthesia Type: General Level of consciousness: awake and alert Pain management: pain level controlled Vital Signs Assessment: post-procedure vital signs reviewed and stable Respiratory status: spontaneous breathing, nonlabored ventilation, respiratory function stable and patient connected to nasal cannula oxygen Cardiovascular status: blood pressure returned to baseline and stable Postop Assessment: no apparent nausea or vomiting Anesthetic complications: no   No notable events documented.  Last Vitals:  Vitals:   01/02/22 1418 01/02/22 1451  BP: (!) 94/55 114/75  Pulse: 77 79  Resp: 13 18  Temp: (!) 36.2 C 36.7 C  SpO2: 92% 98%    Last Pain:  Vitals:   01/02/22 1418  TempSrc:   PainSc: 3                  Santa Lighter

## 2022-01-02 NOTE — Op Note (Signed)
Date of surgery: 01/02/2022 Preoperative diagnosis: Lumbar spinal stenosis L3-4 L4-5 with neurogenic claudication, lumbar radiculopathy. Postoperative diagnosis: Same Procedure: Bilateral laminotomies and foraminotomies L3-4 and L4-5 Surgeon: Kristeen Miss Anesthesia: General endotracheal Indications: Patient is a 71 year old individual whose had a previous laminectomy at L4-L5.  He has evolved severe spondylitic stenosis with neurogenic claudication at L3-4 and L4-5 and has been advised regarding the need for surgical decompression.  Procedure: Patient was brought to the operating room supine on the stretcher.  After the smooth induction of general endotracheal anesthesia, he was carefully turned prone.  The back was prepped with alcohol DuraPrep and draped in a sterile fashion.  Midline incision was created and carried down to the lumbodorsal fascia.  First identifiable lamina was that at L3-4.  A self-retaining retractor was then placed into the wound and the areas of L3-4 and L4-5 were uncovered in a subperiosteal fashion.  Much grumous material from around the facet joints and the interspinous ligament was removed.  Once the laminar arches were clearly identified a laminotomy was created first at the L4-L5 level removing the inferior margin lamina of L4 out to the mesial wall the facet.  Partial medial facetectomy was completed.  Thickened redundant yellow ligament was taken up and underlying this was noted to be the dura in pristine fashion.  The decompression was then undertaken under the lamina of L5 out to the mesial wall the facet and superiorly under lamina of L4 thickened redundant ligamentous material was removed from this region this allowed for good decompression of the central canal and the lateral recesses.  Once this was accomplished at L4-5 same process was carried out at L3-4 here similar findings were noted with substantial grommets and thickened ligamentous material in the  intraligamentous space.  This area was decompressed well.  Once the right side was decompressed attention was turned to the left side where similar process was carried out some angled punches and angled curettes were used to undercut and removed portions of the thickened redundant ligamentous material in the end it was easily palpable that there was a good decompression at L3-4 and L4-5 for the central canal and lateral recesses involving the L3 the L4 and the L5 nerve roots.  Hemostasis was then carefully meticulously obtained 25 cc of half percent Marcaine was injected into the paraspinous fascia in this region and then closure was undertaken with #1 Vicryl in the lumbodorsal fascia 2-0 Vicryl in the subcutaneous tissues 3-0 Vicryl subcuticularly and Dermabond on the skin.  Patient tolerated procedure well blood loss was estimated at 125 cc.

## 2022-01-02 NOTE — Anesthesia Procedure Notes (Signed)
Procedure Name: Intubation Date/Time: 01/02/2022 11:37 AM Performed by: Jenne Campus, CRNA Pre-anesthesia Checklist: Patient identified, Emergency Drugs available, Suction available and Patient being monitored Patient Re-evaluated:Patient Re-evaluated prior to induction Oxygen Delivery Method: Circle System Utilized Preoxygenation: Pre-oxygenation with 100% oxygen Induction Type: IV induction Ventilation: Mask ventilation without difficulty Laryngoscope Size: Glidescope and 4 Grade View: Grade I Tube type: Oral Tube size: 7.5 mm Number of attempts: 1 Airway Equipment and Method: Stylet and Oral airway Placement Confirmation: ETT inserted through vocal cords under direct vision, positive ETCO2 and breath sounds checked- equal and bilateral Secured at: 23 cm Tube secured with: Tape Dental Injury: Teeth and Oropharynx as per pre-operative assessment  Difficulty Due To: Difficulty was anticipated, Difficult Airway- due to reduced neck mobility and Difficult Airway- due to anterior larynx Comments: Elective video-glide. Patient with known difficult airway. Head/neck remained in neutral position during induction and intubation.

## 2022-01-02 NOTE — H&P (Signed)
CHIEF COMPLAINT: Bilateral leg weakness of rather acute onset.  HISTORY OF PRESENT ILLNESS: [name] is a 71 year old left-handed individual who tells me that rather acutely here, he started to develop some substantial weakness and numbness in his legs.  He also notes that he had some sensation of bowel impairment at the same time, and that the problem seemed to recur itself several times during the day yesterday.  He was seen in the emergency department and an MRI of the lumbar spine was performed.  This study demonstrates that the patient has a high-grade stenosis at the level of L3-4, and at the level of L4-5.  What may have been the acute change in this area is that he has an annular tear at L3-4, which does create some central bulging of the disc, and this may have been a problem that occurred rather acutely that created the onset of symptoms.  Nonetheless, the stenosis that he has is indeed quite high-grade.  He notes that the symptoms are still present to a certain degree where he senses some numbness and tingling in the legs and a generalized sense of weakness and loss of stamina.  He was seen in the Emergency Department, and advised regarding an urgent referral for this process.  PAST MEDICAL HISTORY: Reveals that he had a fusion that I did some 30 years ago at the level of L5-S1.  This was an in situ fusion done with posterolateral bone graft, and has healed well.  He has not had any other issues of his back since that time.  About 3 years ago, he underwent anterior cervical decompression and arthroplasty in the neck, which he tolerated very well.  Patient also tells me that he had a history of leukemia number of years ago and had extensive treatment with chemotherapy, which resulted in some mild congestive heart failure.  Nonetheless, his cardiac status has been stable.  He also had many years ago, some difficulty with an intubation.  However, he notes that on recent procedures that he  has had, including shoulder reconstruction surgery and the cervical surgery, he has not had any trouble with being put to sleep or waking up.  CURRENT MEDICATIONS: Include Furosemide, Pantoprazole, Repaglinide for diabetes, Metoprolol, Atorvastatin, Jardiance, Prilosec, Aldactone, Spironolactone, Vitamin D, and Zinc that he uses on a regular basis.     His primary care physician is Dr. Jerene Bears.  PHYSICAL EXAMINATION: I note that he walks reasonably straight.  However, he does tend to favor a slight forward stoop.  He does have some weakness in the tibialis anterior groups. When noting his ability to walk onto his heels and his gastrocs today demonstrate that he has good overall strength, tone and bulk.  He has trace reflexes with spread in the patellae bilaterally, but he has absent Achilles reflexes bilaterally.  IMAGING: Today in the office to further his workup, I obtained AP and lateral radiograph of the lumbar spine with flexion-extension views.  These radiographs demonstrate that he has a solid arthrodesis at the level of L5-S1. He does have some moderate degenerative changes at the discs at L4-5, 3-4, 2-3 and 1-2.  There is a significant lateral spur on the right side at L1-2.  However, between flexion and extension.  There is no abnormal motion or listhesis, particularly at L3-4 and L4-5.  IMPRESSION: The patient has severe stenosis at the level of L3-4 and L4-5 and some mild-to-moderate weakness in the lower extremities with the onset of symptoms being fairly  acute.  I have advised that he should undergo surgical decompression at both L3-4 and L4-5 via bilateral laminotomies and foraminotomies.  This would maintain the integrity of his spinal structures otherwise, but decompress the central canal and lateral recesses, which should alleviate the symptoms from the stenosis. At this point, he has only mild weakness, but given the severity of the stenosis, I do not believe that conservative  management of this process is in his best interest.  I noted that he does not have any instability, so he should not need a fusion surgery, typically is done on an outpatient basis at the [facility], and thereafter patients require about 6-8 weeks to recover, although they are ambulatory during this period of time, ambulation may be limited to short walks.  We will plan on scheduling the surgery at the earliest possible convenience.

## 2022-01-03 ENCOUNTER — Encounter (HOSPITAL_COMMUNITY): Payer: Self-pay | Admitting: Neurological Surgery

## 2022-01-03 DIAGNOSIS — M5416 Radiculopathy, lumbar region: Secondary | ICD-10-CM | POA: Diagnosis not present

## 2022-01-03 DIAGNOSIS — I2510 Atherosclerotic heart disease of native coronary artery without angina pectoris: Secondary | ICD-10-CM | POA: Diagnosis not present

## 2022-01-03 DIAGNOSIS — K219 Gastro-esophageal reflux disease without esophagitis: Secondary | ICD-10-CM | POA: Diagnosis not present

## 2022-01-03 DIAGNOSIS — Z87891 Personal history of nicotine dependence: Secondary | ICD-10-CM | POA: Diagnosis not present

## 2022-01-03 DIAGNOSIS — I10 Essential (primary) hypertension: Secondary | ICD-10-CM | POA: Diagnosis not present

## 2022-01-03 DIAGNOSIS — Z79899 Other long term (current) drug therapy: Secondary | ICD-10-CM | POA: Diagnosis not present

## 2022-01-03 DIAGNOSIS — E119 Type 2 diabetes mellitus without complications: Secondary | ICD-10-CM | POA: Diagnosis not present

## 2022-01-03 DIAGNOSIS — I082 Rheumatic disorders of both aortic and tricuspid valves: Secondary | ICD-10-CM | POA: Diagnosis not present

## 2022-01-03 DIAGNOSIS — M48062 Spinal stenosis, lumbar region with neurogenic claudication: Secondary | ICD-10-CM | POA: Diagnosis not present

## 2022-01-03 LAB — GLUCOSE, CAPILLARY: Glucose-Capillary: 170 mg/dL — ABNORMAL HIGH (ref 70–99)

## 2022-01-03 MED ORDER — METHOCARBAMOL 500 MG PO TABS: 500.0000 mg | ORAL_TABLET | Freq: Four times a day (QID) | ORAL | 3 refills | Status: DC | PRN

## 2022-01-03 MED ORDER — OXYCODONE-ACETAMINOPHEN 5-325 MG PO TABS: 1.0000 | ORAL_TABLET | ORAL | 0 refills | Status: DC | PRN

## 2022-01-03 NOTE — Discharge Summary (Signed)
Physician Discharge Summary  Patient ID: Ricardo Zuniga MRN: [id number] DOB/AGE: [birth year] 72 y.o.  Admit date: 01/02/2022 Discharge date: 01/03/2022  Admission Diagnoses: Lumbar spinal stenosis L3-4 and L4-5 with neurogenic claudication lumbar radiculopathy.  Discharge Diagnoses: Lumbar spinal stenosis L3-4 L4-5 with neurogenic claudication, lumbar radiculopathy. Principal Problem:   Lumbar stenosis with neurogenic claudication   Discharged Condition: good  Hospital Course: Patient was admitted to undergo surgical decompression which she tolerated well his incision is clean and dry his motor function has been stable.  Consults: None  Significant Diagnostic Studies: None  Treatments: surgery: See op note  Discharge Exam: Blood pressure (!) 107/59, pulse 83, temperature 98 F (36.7 C), temperature source Oral, resp. rate 18, height '5\' 9"'$  (1.753 m), weight 82.1 kg, SpO2 99 %. Incision is clean and dry Station and gait are intact.  Disposition: Discharge disposition: 01-Home or Self Care       Discharge Instructions     Call MD for:  redness, tenderness, or signs of infection (pain, swelling, redness, odor or green/yellow discharge around incision site)   Complete by: As directed    Call MD for:  severe uncontrolled pain   Complete by: As directed    Call MD for:  temperature >100.4   Complete by: As directed    Diet - low sodium heart healthy   Complete by: As directed    Discharge wound care:   Complete by: As directed    Okay to shower. Do not apply salves or appointments to incision. No heavy lifting with the upper extremities greater than 10 pounds. May resume driving when not requiring pain medication and patient feels comfortable with doing so.   Incentive spirometry RT   Complete by: As directed    Increase activity slowly   Complete by: As directed       Allergies as of 01/03/2022       Reactions   Lisinopril Cough        Medication List      TAKE these medications    acetaminophen 650 MG CR tablet Commonly known as: TYLENOL Take 650-1,300 mg by mouth every 8 (eight) hours as needed for pain.   aspirin EC 81 MG tablet Take 81 mg by mouth in the morning.   atorvastatin 40 MG tablet Commonly known as: LIPITOR TAKE 1 TABLET BY MOUTH ONCE DAILY NEEDS  OFFICE  VISIT.PLEASE  CALL  THE  OFFICE What changed: See the new instructions.   diazepam 5 MG tablet Commonly known as: VALIUM Take 5 mg by mouth daily as needed (vertigo).   Entresto 24-26 MG Generic drug: sacubitril-valsartan Take 1 tablet by mouth twice daily   furosemide 20 MG tablet Commonly known as: LASIX TAKE 1 TABLET BY MOUTH ONCE DAILY AS DIRECTED What changed:  when to take this additional instructions   guaiFENesin 600 MG 12 hr tablet Commonly known as: MUCINEX Take 600 mg by mouth daily as needed (congestion.).   Jardiance 10 MG Tabs tablet Generic drug: empagliflozin Take 10 mg by mouth in the morning.   methocarbamol 500 MG tablet Commonly known as: ROBAXIN Take 1 tablet (500 mg total) by mouth every 6 (six) hours as needed for muscle spasms.   metoprolol succinate 25 MG 24 hr tablet Commonly known as: TOPROL-XL Take 1 tablet (25 mg total) by mouth daily. What changed:  how much to take when to take this   naproxen sodium 220 MG tablet Commonly known as: ALEVE Take 220-440 mg by mouth  daily as needed (pain.).   omeprazole 20 MG capsule Commonly known as: PRILOSEC Take 20 mg by mouth daily before breakfast.   ondansetron 4 MG tablet Commonly known as: ZOFRAN Take 1 tablet (4 mg total) by mouth every 6 (six) hours as needed for nausea.   oxyCODONE-acetaminophen 5-325 MG tablet Commonly known as: PERCOCET/ROXICET Take 1-2 tablets by mouth every 4 (four) hours as needed for moderate pain or severe pain.   repaglinide 2 MG tablet Commonly known as: PRANDIN Take 2 mg by mouth 3 (three) times daily before meals.   spironolactone  25 MG tablet Commonly known as: ALDACTONE Take 0.5 tablets (12.5 mg total) by mouth daily.   Vitamin D3 125 MCG (5000 UT) Tabs Take 1 tablet by mouth in the morning.   zinc gluconate 50 MG tablet Take 50 mg by mouth in the morning.               Discharge Care Instructions  (From admission, onward)           Start     Ordered   01/03/22 0000  Discharge wound care:       Comments: Okay to shower. Do not apply salves or appointments to incision. No heavy lifting with the upper extremities greater than 10 pounds. May resume driving when not requiring pain medication and patient feels comfortable with doing so.   01/03/22 0939             Signed: Blanchie Dessert Purva Vessell 01/03/2022, 9:39 AM

## 2022-01-03 NOTE — Progress Notes (Signed)
Patient alert and oriented, mae's well, voiding adequate amount of urine, swallowing without difficulty, no c/o pain at time of discharge. Patient discharged home with wife. Script and discharged instructions given to patient. Patient stated understanding of instructions given. Patient has an appointment with Dr. Ellene Route

## 2022-01-03 NOTE — Progress Notes (Signed)
PT Cancellation Note  Patient Details Name: Ricardo Zuniga MRN: [id number] DOB: May 16, 1951   Cancelled Treatment:    Reason Eval/Treat Not Completed: PT screened, no needs identified, will sign off. Discussed pt case with OT who states pt is independently mobilizing around the unit this morning. Pt does not require a formal PT evaluation at this time. Will sign off, if needs change please reconsult.    Thelma Comp 01/03/2022, 8:35 AM  Rolinda Roan, PT, DPT Acute Rehabilitation Services Secure Chat Preferred Office: [phone]

## 2022-01-03 NOTE — Progress Notes (Signed)
  Transition of Care [facility]) Screening Note   Patient Details  Name: Ricardo Zuniga Date of Birth: Jul 20, 1951   Transition of Care [facility]) CM/SW Contact:    Geralynn Ochs, LCSW Phone Number: 01/03/2022, 9:26 AM    Transition of Care Department [facility]) has reviewed patient and no TOC needs have been identified at this time. We will continue to monitor patient advancement through interdisciplinary progression rounds. If new patient transition needs arise, please place a TOC consult.

## 2022-01-03 NOTE — Evaluation (Signed)
Occupational Therapy Evaluation Patient Details Name: Ricardo Zuniga MRN: [id number] DOB: 1950/12/11 Today's Date: 01/03/2022   History of Present Illness 71 yo male s/p L3-5 lami on 5/25. PMH including acute promyelocytic leukemia, CAD, Gastroesophageal reflux disease, Neuropathy, Nonischemic cardiomyopathy, and Type 2 diabetes mellitus.   Clinical Impression   PTA, pt was living with his wife and was independent. Currently, pt performing at Mod I-Independent level for ADLs and functional mobility. Provided education and handout on back precautions, bed mobility, grooming, UB ADLs, LB ADLs, toilet transfer, and functional transfer; pt demonstrated understanding. Answered all pt questions. Recommend dc home once medically stable per physician. All acute OT needs met and will sign off. Thank you.    Recommendations for follow up therapy are one component of a multi-disciplinary discharge planning process, led by the attending physician.  Recommendations may be updated based on patient status, additional functional criteria and insurance authorization.   Follow Up Recommendations  No OT follow up    Assistance Recommended at Discharge PRN  Patient can return home with the following      Functional Status Assessment     Equipment Recommendations  None recommended by OT    Recommendations for Other Services       Precautions / Restrictions Precautions Precautions: Back Precaution Booklet Issued: Yes (comment) Precaution Comments: Reviewed back precautions and compensatory techniques Required Braces or Orthoses: Other Brace Other Brace: No brace per MD order      Mobility Bed Mobility Overal bed mobility: Modified Independent             General bed mobility comments: Log roll with increased time    Transfers Overall transfer level: Independent                        Balance Overall balance assessment: Independent                                          ADL either performed or assessed with clinical judgement   ADL Overall ADL's : Modified independent                                       General ADL Comments: Increased time. PRovided education on back precautions, bed mobility, grooming, UB ADLs, LB ADLS, toileting, and fucntional transfers     Vision Baseline Vision/History: 1 Wears glasses       Perception     Praxis      Pertinent Vitals/Pain Pain Assessment Pain Assessment: No/denies pain     Hand Dominance Right   Extremity/Trunk Assessment Upper Extremity Assessment Upper Extremity Assessment: Overall WFL for tasks assessed   Lower Extremity Assessment Lower Extremity Assessment: Overall WFL for tasks assessed   Cervical / Trunk Assessment Cervical / Trunk Assessment: Back Surgery   Communication Communication Communication: No difficulties   Cognition Arousal/Alertness: Awake/alert Behavior During Therapy: WFL for tasks assessed/performed Overall Cognitive Status: Within Functional Limits for tasks assessed                                       General Comments       Exercises     Shoulder Instructions  Home Living Family/patient expects to be discharged to:: Private residence Living Arrangements: Spouse/significant other Available Help at Discharge: Family Type of Home: House Home Access: Stairs to enter [employer] of Steps: 1 Entrance Stairs-Rails: None Home Layout: Two level;Able to live on main level with bedroom/bathroom;Bed/bath upstairs;Full bath on main level     Bathroom Shower/Tub: Occupational psychologist: Handicapped height     Home Equipment: Conservation officer, nature (2 wheels);Cane - single point;Shower seat - built in          Prior Functioning/Environment Prior Level of Function : Independent/Modified Independent                        OT Problem List: Decreased knowledge of precautions;Decreased  knowledge of use of DME or AE;Decreased range of motion      OT Treatment/Interventions:      OT Goals(Current goals can be found in the care plan section) Acute Rehab OT Goals Patient Stated Goal: Go home OT Goal Formulation: All assessment and education complete, DC therapy  OT Frequency:      Co-evaluation              AM-PAC OT "6 Clicks" Daily Activity     Outcome Measure Help from another person eating meals?: None Help from another person taking care of personal grooming?: None Help from another person toileting, which includes using toliet, bedpan, or urinal?: None Help from another person bathing (including washing, rinsing, drying)?: None Help from another person to put on and taking off regular upper body clothing?: None Help from another person to put on and taking off regular lower body clothing?: None 6 Click Score: 24   End of Session Nurse Communication: Mobility status  Activity Tolerance: Patient tolerated treatment well Patient left: in bed;with call bell/phone within reach (sitting at EOB)  OT Visit Diagnosis: Muscle weakness (generalized) (M62.81);Pain Pain - part of body:  (Back)                Time: 5072-2575 OT Time Calculation (min): 15 min Charges:  OT General Charges $OT Visit: 1 Visit OT Evaluation $OT Eval Low Complexity: 1 Low  Bryton Waight MSOT, OTR/L Acute Rehab Pager: [phone] Office: [place] 01/03/2022, 8:12 AM

## 2022-02-10 NOTE — Progress Notes (Unsigned)
Cardiology Office Note  Date: 02/13/2022   ID: Ricardo Zuniga, DOB September 21, 1950, MRN [id number]  PCP:  Glenda Chroman, MD  Cardiologist:  [name], MD Electrophysiologist:  None   Chief Complaint  Patient presents with   Cardiac follow-up    History of Present Illness: Ricardo Zuniga is a 71 y.o. male last seen in August 2022 with interval follow-up for preoperative evaluation in May of this year, I reviewed the note. He underwent lumbar spine surgery in May, no obvious perioperative cardiac complications.  He is here for a routine visit, reports NYHA class I dyspnea, no exertional chest pain, no palpitations or syncope.  Weight has been stable and he reports no leg swelling, no orthopnea.  Last echocardiogram in August 2022 revealed LVEF 40 to 45% range with mild diastolic dysfunction, normal RV contraction.  We discussed getting an updated study on his current regimen which is reviewed below.  I went over his recent lab work from May.  Past Medical History:  Diagnosis Date   Anal fissure    APML (acute promyelocytic leukemia) in remission ([place])    Completed treatment 04/2013   Coronary artery disease    Nonobstructive at cardiac catheterization 09/2018   Difficult intubation    w/shoulder surgery at surgical center between 2005-2008   Gastroesophageal reflux disease    History of blood transfusion 2014   History of kidney stones    passed stones   Hypercholesteremia    Neuropathy    bilateral feet   Nonischemic cardiomyopathy ([place])    a. EF 20% in 2015 - thought to possibly be viral induced or chemotherapy induced from treatments in 2014 b. at 35-40% by echo in 05/2016    Peptic ulcer disease    Pneumonia    Yrs ago   Type 2 diabetes mellitus ([place])    Ureteral colic    Vertigo    tx with valium prn   Wears glasses    Wears hearing aid in both ears     Past Surgical History:  Procedure Laterality Date   ANAL FISSURE REPAIR     APPENDECTOMY     BIOPSY   08/17/2020   Procedure: BIOPSY;  Surgeon: Harvel Quale, MD;  [facility];  Service: Gastroenterology;;   Wilmon Pali RELEASE Left 10/16/2017   Procedure: LEFT CARPAL TUNNEL RELEASE;  Surgeon: Earlie Server, MD;  Location: [place];  Service: Orthopedics;  Laterality: Left;   CARPAL TUNNEL RELEASE Right 11/25/2019   Procedure: CARPAL TUNNEL RELEASE;  Surgeon: Earlie Server, MD;  [facility];  Service: Orthopedics;  Laterality: Right;   CATARACT EXTRACTION, BILATERAL     CERVICAL DISC ARTHROPLASTY N/A 01/05/2018   Procedure: Artificial Cervical Disc ReplacementCervical Three-Four, Cervical Four-Five;  Surgeon: Kristeen Miss, MD;  Location: [place];  Service: Neurosurgery;  Laterality: N/A;   CERVICAL FUSION     CHOLECYSTECTOMY     COLONOSCOPY N/A 06/27/2015   Procedure: COLONOSCOPY;  Surgeon: Rogene Houston, MD;  [facility];  Service: Endoscopy;  Laterality: N/A;  730   COLONOSCOPY WITH PROPOFOL N/A 08/17/2020   Procedure: COLONOSCOPY WITH PROPOFOL;  Surgeon: Harvel Quale, MD;  [facility];  Service: Gastroenterology;  Laterality: N/A;  11:15   COLONOSCOPY WITH PROPOFOL N/A 10/23/2021   Procedure: COLONOSCOPY WITH PROPOFOL;  Surgeon: Harvel Quale, MD;  [facility];  Service: Gastroenterology;  Laterality: N/A;  9:45am   ESOPHAGOGASTRODUODENOSCOPY (EGD) WITH PROPOFOL N/A 08/17/2020  Procedure: ESOPHAGOGASTRODUODENOSCOPY (EGD) WITH PROPOFOL;  Surgeon: Harvel Quale, MD;  [facility];  Service: Gastroenterology;  Laterality: N/A;   KNEE SURGERY     x3   LEFT AND RIGHT HEART CATHETERIZATION WITH CORONARY ANGIOGRAM N/A 12/12/2013   Procedure: LEFT AND RIGHT HEART CATHETERIZATION WITH CORONARY ANGIOGRAM;  Surgeon: [name], MD;  Location[facility] C[facility];  Service: Cardiovascular;  Laterality: N/A;   LEFT HEART CATH AND CORONARY ANGIOGRAPHY N/A 09/17/2018   Procedure: LEFT HEART CATH AND  CORONARY ANGIOGRAPHY;  Surgeon: Wellington Hampshire, MD;  Location: [place] CV LAB;  Service: Cardiovascular;  Laterality: N/A;   LUMBAR FUSION     LUMBAR LAMINECTOMY/DECOMPRESSION MICRODISCECTOMY Bilateral 01/02/2022   Procedure: Bilateral Lumbar Three-Four/Lumbar Four-Five Sublaminar decompression;  Surgeon: Kristeen Miss, MD;  Location: [place];  Service: Neurosurgery;  Laterality: Bilateral;  3C/RM 21 To Follow Dr Annette Stable   POLYPECTOMY  08/17/2020   Procedure: POLYPECTOMY;  Surgeon: Harvel Quale, MD;  [facility];  Service: Gastroenterology;;   POLYPECTOMY  10/23/2021   Procedure: POLYPECTOMY;  Surgeon: Harvel Quale, MD;  [facility];  Service: Gastroenterology;;   SHOULDER ACROMIOPLASTY Right 07/18/2015   Procedure: SHOULDER ACROMIOPLASTY;  Surgeon: Earlie Server, MD;  Location: [place];  Service: Orthopedics;  Laterality: Right;   SHOULDER ARTHROSCOPY Right 07/18/2015   Procedure: Right shoulder arthroscopy with debridement;  Surgeon: Earlie Server, MD;  Location: [place];  Service: Orthopedics;  Laterality: Right;   SHOULDER SURGERY     x3   TONSILLECTOMY      Current Outpatient Medications  Medication Sig Dispense Refill   acetaminophen (TYLENOL) 650 MG CR tablet Take 650-1,300 mg by mouth every 8 (eight) hours as needed for pain.     aspirin EC 81 MG tablet Take 81 mg by mouth in the morning.     atorvastatin (LIPITOR) 40 MG tablet TAKE 1 TABLET BY MOUTH ONCE DAILY NEEDS  OFFICE  VISIT.PLEASE  CALL  THE  OFFICE (Patient taking differently: Take 40 mg by mouth every evening.) 90 tablet 0   Cholecalciferol (VITAMIN D3) 125 MCG (5000 UT) TABS Take 1 tablet by mouth in the morning.     diazepam (VALIUM) 5 MG tablet Take 5 mg by mouth daily as needed (vertigo).     ENTRESTO 24-26 MG Take 1 tablet by mouth twice daily 180 tablet 0   furosemide (LASIX) 20 MG tablet TAKE 1 TABLET BY MOUTH ONCE DAILY AS DIRECTED  (Patient taking differently: Take 20 mg by mouth in the morning.) 90 tablet 0   guaiFENesin (MUCINEX) 600 MG 12 hr tablet Take 600 mg by mouth daily as needed (congestion.).     JARDIANCE 10 MG TABS tablet Take 10 mg by mouth in the morning.     methocarbamol (ROBAXIN) 500 MG tablet Take 1 tablet (500 mg total) by mouth every 6 (six) hours as needed for muscle spasms. 30 tablet 3   metoprolol succinate (TOPROL-XL) 25 MG 24 hr tablet Take 1 tablet (25 mg total) by mouth daily. (Patient taking differently: Take 12.5 mg by mouth in the morning.) 90 tablet 3   naproxen sodium (ALEVE) 220 MG tablet Take 220-440 mg by mouth daily as needed (pain.).     omeprazole (PRILOSEC) 20 MG capsule Take 20 mg by mouth daily before breakfast.     ondansetron (ZOFRAN) 4 MG tablet Take 1 tablet (4 mg total) by mouth every 6 (six) hours as needed for  nausea. 20 tablet 0   repaglinide (PRANDIN) 2 MG tablet Take 2 mg by mouth 3 (three) times daily before meals.      spironolactone (ALDACTONE) 25 MG tablet Take 0.5 tablets (12.5 mg total) by mouth daily. 45 tablet 3   zinc gluconate 50 MG tablet Take 50 mg by mouth in the morning.     No current facility-administered medications for this visit.   Allergies:  Lisinopril   ROS: No syncope.  Physical Exam: VS:  BP (!) 100/58   Pulse 87   Ht '5\' 9"'$  (1.753 m)   Wt 186 lb 9.6 oz (84.6 kg)   SpO2 96%   BMI 27.56 kg/m , BMI Body mass index is 27.56 kg/m.  Wt Readings from Last 3 Encounters:  02/13/22 186 lb 9.6 oz (84.6 kg)  01/02/22 181 lb (82.1 kg)  10/30/21 183 lb (83 kg)    General: Patient appears comfortable at rest. HEENT: Conjunctiva and lids normal, oropharynx clear. Neck: Supple, no elevated JVP or carotid bruits, no thyromegaly. Lungs: Clear to auscultation, nonlabored breathing at rest. Cardiac: Regular rate and rhythm, no S3 or significant systolic murmur, no pericardial rub. Extremities: No pitting edema.  ECG:  An ECG dated 10/30/2021 was  personally reviewed today and demonstrated:  Sinus tachycardia with PVCs, left atrial enlargement, nonspecific T wave changes.  Recent Labwork: 10/13/2021: B Natriuretic Peptide 137.0 11/03/2021: ALT 19; AST 15; Magnesium 1.8 12/29/2021: BUN 16; Creatinine, Ser 0.98; Hemoglobin 15.2; Platelets 224; Potassium 3.8; Sodium 135   Other Studies Reviewed Today:  Echocardiogram 03/27/2021:  1. Left ventricular ejection fraction, by estimation, is 40 to 45%. The  left ventricle has mildly decreased function. The left ventricle  demonstrates global hypokinesis. Left ventricular diastolic parameters are  consistent with Grade I diastolic  dysfunction (impaired relaxation).   2. Right ventricular systolic function is normal. The right ventricular  size is normal. Tricuspid regurgitation signal is inadequate for assessing  PA pressure.   3. The mitral valve is grossly normal. No evidence of mitral valve  regurgitation. No evidence of mitral stenosis.   4. The aortic valve is tricuspid. Aortic valve regurgitation is moderate.  No aortic stenosis is present.   Assessment and Plan:  1.  HFrEF with nonischemic cardiomyopathy, LVEF 40 to 45% by echocardiogram in August of last year.  He is clinically stable with NYHA class I dyspnea, no significant fluid overload on examination.  Continue Toprol-XL, Entresto, Jardiance, Aldactone, and Lasix.  Plan to update echocardiogram.  2.  Nonobstructive CAD.  No active angina symptoms.  He remains on low-dose aspirin as well as Lipitor.  Medication Adjustments/Labs and Tests Ordered: Current medicines are reviewed at length with the patient today.  Concerns regarding medicines are outlined above.   Tests Ordered: Orders Placed This Encounter  Procedures   ECHOCARDIOGRAM COMPLETE    Medication Changes: No orders of the defined types were placed in this encounter.   Disposition:  Follow up  6 months.  Signed, Satira Sark, MD, [facility] 02/13/2022 8:32 AM     [facility] [address], [place], [place] [address] Phone: [phone]; Fax: [phone]

## 2022-02-13 ENCOUNTER — Encounter: Payer: Self-pay | Admitting: Cardiology

## 2022-02-13 ENCOUNTER — Ambulatory Visit: Payer: Medicare PPO | Admitting: Cardiology

## 2022-02-13 VITALS — BP 100/58 | HR 87 | Ht 69.0 in | Wt 186.6 lb

## 2022-02-13 DIAGNOSIS — I5020 Unspecified systolic (congestive) heart failure: Secondary | ICD-10-CM

## 2022-02-13 DIAGNOSIS — I25119 Atherosclerotic heart disease of native coronary artery with unspecified angina pectoris: Secondary | ICD-10-CM | POA: Diagnosis not present

## 2022-02-13 NOTE — Patient Instructions (Signed)
Medication Instructions:    Your physician recommends that you continue on your current medications as directed. Please refer to the Current Medication list given to you today.  Labwork:  None  Testing/Procedures: Your physician has requested that you have an echocardiogram. Echocardiography is a painless test that uses sound waves to create images of your heart. It provides your doctor with information about the size and shape of your heart and how well your heart's chambers and valves are working. This procedure takes approximately one hour. There are no restrictions for this procedure.  Follow-Up:  Your physician recommends that you schedule a follow-up appointment in: 6 months.  Any Other Special Instructions Will Be Listed Below (If Applicable).  If you need a refill on your cardiac medications before your next appointment, please call your pharmacy. 

## 2022-02-17 ENCOUNTER — Ambulatory Visit (INDEPENDENT_AMBULATORY_CARE_PROVIDER_SITE_OTHER): Payer: Medicare PPO

## 2022-02-17 DIAGNOSIS — I5020 Unspecified systolic (congestive) heart failure: Secondary | ICD-10-CM

## 2022-02-17 DIAGNOSIS — H109 Unspecified conjunctivitis: Secondary | ICD-10-CM | POA: Diagnosis not present

## 2022-02-17 DIAGNOSIS — I700 Atherosclerosis of aorta: Secondary | ICD-10-CM | POA: Diagnosis not present

## 2022-02-17 DIAGNOSIS — Z299 Encounter for prophylactic measures, unspecified: Secondary | ICD-10-CM | POA: Diagnosis not present

## 2022-02-17 DIAGNOSIS — I10 Essential (primary) hypertension: Secondary | ICD-10-CM | POA: Diagnosis not present

## 2022-02-17 DIAGNOSIS — I429 Cardiomyopathy, unspecified: Secondary | ICD-10-CM | POA: Diagnosis not present

## 2022-02-17 LAB — ECHOCARDIOGRAM COMPLETE
AR max vel: 1.73 cm2
AV Area VTI: 1.98 cm2
AV Area mean vel: 1.63 cm2
AV Mean grad: 4 mmHg
AV Peak grad: 7.1 mmHg
AV Vena cont: 0.45 cm
Ao pk vel: 1.33 m/s
Area-P 1/2: 4.41 cm2
Calc EF: 43.8 %
MV M vel: 4.12 m/s
MV Peak grad: 67.7 mmHg
P 1/2 time: 397 msec
S' Lateral: 4.75 cm
Single Plane A2C EF: 43.6 %
Single Plane A4C EF: 42.2 %

## 2022-02-22 ENCOUNTER — Other Ambulatory Visit: Payer: Self-pay | Admitting: Cardiology

## 2022-02-28 ENCOUNTER — Other Ambulatory Visit: Payer: Self-pay | Admitting: Cardiology

## 2022-03-19 ENCOUNTER — Other Ambulatory Visit: Payer: Self-pay | Admitting: Cardiology

## 2022-03-21 ENCOUNTER — Other Ambulatory Visit: Payer: Self-pay | Admitting: Cardiology

## 2022-04-03 DIAGNOSIS — J320 Chronic maxillary sinusitis: Secondary | ICD-10-CM | POA: Diagnosis not present

## 2022-04-03 DIAGNOSIS — E1159 Type 2 diabetes mellitus with other circulatory complications: Secondary | ICD-10-CM | POA: Diagnosis not present

## 2022-04-03 DIAGNOSIS — Z789 Other specified health status: Secondary | ICD-10-CM | POA: Diagnosis not present

## 2022-04-03 DIAGNOSIS — Z299 Encounter for prophylactic measures, unspecified: Secondary | ICD-10-CM | POA: Diagnosis not present

## 2022-04-03 DIAGNOSIS — I152 Hypertension secondary to endocrine disorders: Secondary | ICD-10-CM | POA: Diagnosis not present

## 2022-04-03 DIAGNOSIS — I10 Essential (primary) hypertension: Secondary | ICD-10-CM | POA: Diagnosis not present

## 2022-04-16 DIAGNOSIS — J310 Chronic rhinitis: Secondary | ICD-10-CM | POA: Diagnosis not present

## 2022-04-16 DIAGNOSIS — J343 Hypertrophy of nasal turbinates: Secondary | ICD-10-CM | POA: Diagnosis not present

## 2022-04-16 DIAGNOSIS — J342 Deviated nasal septum: Secondary | ICD-10-CM | POA: Diagnosis not present

## 2022-05-13 DIAGNOSIS — Z789 Other specified health status: Secondary | ICD-10-CM | POA: Diagnosis not present

## 2022-05-13 DIAGNOSIS — Z2821 Immunization not carried out because of patient refusal: Secondary | ICD-10-CM | POA: Diagnosis not present

## 2022-05-13 DIAGNOSIS — F419 Anxiety disorder, unspecified: Secondary | ICD-10-CM | POA: Diagnosis not present

## 2022-05-13 DIAGNOSIS — Z299 Encounter for prophylactic measures, unspecified: Secondary | ICD-10-CM | POA: Diagnosis not present

## 2022-05-13 DIAGNOSIS — I10 Essential (primary) hypertension: Secondary | ICD-10-CM | POA: Diagnosis not present

## 2022-05-20 ENCOUNTER — Ambulatory Visit (HOSPITAL_COMMUNITY)
Admission: RE | Admit: 2022-05-20 | Discharge: 2022-05-20 | Disposition: A | Discharge status: Home / Self Care | Payer: Medicare PPO | Source: Ambulatory Visit | Attending: Nurse Practitioner | Admitting: Nurse Practitioner

## 2022-05-20 ENCOUNTER — Other Ambulatory Visit (HOSPITAL_COMMUNITY): Payer: Self-pay | Admitting: Nurse Practitioner

## 2022-05-20 DIAGNOSIS — R0602 Shortness of breath: Secondary | ICD-10-CM | POA: Diagnosis not present

## 2022-05-20 DIAGNOSIS — Z789 Other specified health status: Secondary | ICD-10-CM | POA: Diagnosis not present

## 2022-05-20 DIAGNOSIS — Z299 Encounter for prophylactic measures, unspecified: Secondary | ICD-10-CM | POA: Diagnosis not present

## 2022-05-20 DIAGNOSIS — I10 Essential (primary) hypertension: Secondary | ICD-10-CM | POA: Diagnosis not present

## 2022-05-20 DIAGNOSIS — F419 Anxiety disorder, unspecified: Secondary | ICD-10-CM | POA: Diagnosis not present

## 2022-05-20 DIAGNOSIS — R5383 Other fatigue: Secondary | ICD-10-CM | POA: Diagnosis not present

## 2022-05-22 ENCOUNTER — Telehealth: Payer: Self-pay | Admitting: Cardiology

## 2022-05-22 NOTE — Telephone Encounter (Signed)
Pt c/o Shortness Of Breath: STAT if SOB developed within the last 24 hours or pt is noticeably SOB on the phone  1. Are you currently SOB (can you hear that pt is SOB on the phone)? SOB  2. How long have you been experiencing SOB? yesterday  3. Are you SOB when sitting or when up moving around? Both   4. Are you currently experiencing any other symptoms? Weak

## 2022-05-22 NOTE — Telephone Encounter (Signed)
I spoke with wife and she has been concerned that Ricardo Zuniga has been SOB for about 2 weeks but it is becoming worse. His HR has been elevated to 115 as he was only taking toprol 12.5 mg qd.Wife denies weight gain or lower extremity edema.   Saw pcp 2 days ago, had CXR, given xanax for his anxiety for his SOB and was told to see cardiologist.They also recommended that her increase toprol to 25 mg qd.HR this am was 105.    Appointment made tomorrow at [facility] with Laurann Montana, NP

## 2022-05-23 ENCOUNTER — Ambulatory Visit (HOSPITAL_BASED_OUTPATIENT_CLINIC_OR_DEPARTMENT_OTHER): Payer: Medicare PPO | Admitting: Family

## 2022-05-23 ENCOUNTER — Other Ambulatory Visit (HOSPITAL_COMMUNITY): Payer: Self-pay

## 2022-05-23 ENCOUNTER — Encounter (HOSPITAL_BASED_OUTPATIENT_CLINIC_OR_DEPARTMENT_OTHER): Payer: Self-pay | Admitting: Family

## 2022-05-23 VITALS — BP 108/70 | HR 88 | Ht 66.0 in | Wt 187.6 lb

## 2022-05-23 DIAGNOSIS — I2510 Atherosclerotic heart disease of native coronary artery without angina pectoris: Secondary | ICD-10-CM | POA: Diagnosis not present

## 2022-05-23 DIAGNOSIS — R072 Precordial pain: Secondary | ICD-10-CM | POA: Diagnosis not present

## 2022-05-23 DIAGNOSIS — E785 Hyperlipidemia, unspecified: Secondary | ICD-10-CM | POA: Diagnosis not present

## 2022-05-23 DIAGNOSIS — I5022 Chronic systolic (congestive) heart failure: Secondary | ICD-10-CM | POA: Diagnosis not present

## 2022-05-23 MED ORDER — IVABRADINE HCL 5 MG PO TABS
10.0000 mg | ORAL_TABLET | Freq: Once | ORAL | 0 refills | Status: AC
Start: 2022-05-23 — End: 2022-05-29
  Filled 2022-05-23: qty 2, 1d supply, fill #0

## 2022-05-23 MED ORDER — ALBUTEROL SULFATE HFA 108 (90 BASE) MCG/ACT IN AERS: 2.0000 | INHALATION_SPRAY | Freq: Four times a day (QID) | RESPIRATORY_TRACT | 2 refills | Status: DC | PRN

## 2022-05-23 NOTE — Progress Notes (Signed)
Office Visit    Patient Name: GIVANNI STARON Date of Encounter: 05/23/2022  PCP:  Glenda Chroman, MD   [place]  Cardiologist:  [name], MD  Advanced Practice Provider:  No care team member to display Electrophysiologist:  None   Chief Complaint    LESLEE HAUETER is a 71 y.o. male presents today for shortness of breath.   Past Medical History    Past Medical History:  Diagnosis Date   Anal fissure    APML (acute promyelocytic leukemia) in remission ([place])    Completed treatment 04/2013   Coronary artery disease    Nonobstructive at cardiac catheterization 09/2018   Difficult intubation    w/shoulder surgery at surgical center between 2005-2008   Gastroesophageal reflux disease    History of blood transfusion 2014   History of kidney stones    passed stones   Hypercholesteremia    Neuropathy    bilateral feet   Nonischemic cardiomyopathy ([place])    a. EF 20% in 2015 - thought to possibly be viral induced or chemotherapy induced from treatments in 2014 b. at 35-40% by echo in 05/2016    Peptic ulcer disease    Pneumonia    Yrs ago   Type 2 diabetes mellitus ([place])    Ureteral colic    Vertigo    tx with valium prn   Wears glasses    Wears hearing aid in both ears    Past Surgical History:  Procedure Laterality Date   ANAL FISSURE REPAIR     APPENDECTOMY     BIOPSY  08/17/2020   Procedure: BIOPSY;  Surgeon: Harvel Quale, MD;  [facility];  Service: Gastroenterology;;   Wilmon Pali RELEASE Left 10/16/2017   Procedure: LEFT CARPAL TUNNEL RELEASE;  Surgeon: Earlie Server, MD;  Location: [place];  Service: Orthopedics;  Laterality: Left;   CARPAL TUNNEL RELEASE Right 11/25/2019   Procedure: CARPAL TUNNEL RELEASE;  Surgeon: Earlie Server, MD;  [facility];  Service: Orthopedics;  Laterality: Right;   CATARACT EXTRACTION, BILATERAL     CERVICAL DISC ARTHROPLASTY N/A 01/05/2018   Procedure: Artificial  Cervical Disc ReplacementCervical Three-Four, Cervical Four-Five;  Surgeon: Kristeen Miss, MD;  Location: [place];  Service: Neurosurgery;  Laterality: N/A;   CERVICAL FUSION     CHOLECYSTECTOMY     COLONOSCOPY N/A 06/27/2015   Procedure: COLONOSCOPY;  Surgeon: Rogene Houston, MD;  [facility];  Service: Endoscopy;  Laterality: N/A;  730   COLONOSCOPY WITH PROPOFOL N/A 08/17/2020   Procedure: COLONOSCOPY WITH PROPOFOL;  Surgeon: Harvel Quale, MD;  [facility];  Service: Gastroenterology;  Laterality: N/A;  11:15   COLONOSCOPY WITH PROPOFOL N/A 10/23/2021   Procedure: COLONOSCOPY WITH PROPOFOL;  Surgeon: Harvel Quale, MD;  [facility];  Service: Gastroenterology;  Laterality: N/A;  9:45am   ESOPHAGOGASTRODUODENOSCOPY (EGD) WITH PROPOFOL N/A 08/17/2020   Procedure: ESOPHAGOGASTRODUODENOSCOPY (EGD) WITH PROPOFOL;  Surgeon: Harvel Quale, MD;  [facility];  Service: Gastroenterology;  Laterality: N/A;   KNEE SURGERY     x3   LEFT AND RIGHT HEART CATHETERIZATION WITH CORONARY ANGIOGRAM N/A 12/12/2013   Procedure: LEFT AND RIGHT HEART CATHETERIZATION WITH CORONARY ANGIOGRAM;  Surgeon: [name], MD;  [facility];  Service: Cardiovascular;  Laterality: N/A;   LEFT HEART CATH AND CORONARY ANGIOGRAPHY N/A 09/17/2018   Procedure: LEFT HEART CATH AND CORONARY ANGIOGRAPHY;  Surgeon: Fletcher Anon,  Mertie Clause, MD;  Location: [place] CV LAB;  Service: Cardiovascular;  Laterality: N/A;   LUMBAR FUSION     LUMBAR LAMINECTOMY/DECOMPRESSION MICRODISCECTOMY Bilateral 01/02/2022   Procedure: Bilateral Lumbar Three-Four/Lumbar Four-Five Sublaminar decompression;  Surgeon: Kristeen Miss, MD;  Location: [place];  Service: Neurosurgery;  Laterality: Bilateral;  3C/RM 21 To Follow Dr Annette Stable   POLYPECTOMY  08/17/2020   Procedure: POLYPECTOMY;  Surgeon: Harvel Quale, MD;  [facility];  Service: Gastroenterology;;    POLYPECTOMY  10/23/2021   Procedure: POLYPECTOMY;  Surgeon: Harvel Quale, MD;  [facility];  Service: Gastroenterology;;   SHOULDER ACROMIOPLASTY Right 07/18/2015   Procedure: SHOULDER ACROMIOPLASTY;  Surgeon: Earlie Server, MD;  Location: [place];  Service: Orthopedics;  Laterality: Right;   SHOULDER ARTHROSCOPY Right 07/18/2015   Procedure: Right shoulder arthroscopy with debridement;  Surgeon: Earlie Server, MD;  Location: [place];  Service: Orthopedics;  Laterality: Right;   SHOULDER SURGERY     x3   TONSILLECTOMY      Allergies  Allergies  Allergen Reactions   Lisinopril Cough    History of Present Illness    THOMS BARTHELEMY is a 71 y.o. male with a hx of nonobstructive coronary artery disease, HFrEF with nonischemic cardiomyopathy, HLD, DM2, GERD last seen 02/13/22.  Last seen 02/13/22 by Dr. Domenic Polite recovering well from lumbar spine surgery in May. He noted NYHA I dyspnea. Updated echo ordered. Echo 02/17/22 EF 45%, RV normal size/function, trivial MR, moderate AI, mild dilation ascending aorta 61m.  His wife contacted the office yesterday noting increased shortness of breath for 2 weeks. Saw primary care three days prior with CXR, given Xanax for anxiety regarding his shortness of breath, Toprol increased to '25mg'$  QD.  He presents today for follow up with his wife. Tells me his breathing feels like it did when his EF was 15%. Also notes difficulties with allergies and has been seen by ENT Dr. TBenjamine Mola He tells me his chest xray was overall clear with no pulmonary edema. No chest pain, pressure, tightness. No edema. Some orthopnea, no PND. Tells me heart rate at home has been elevated as high as 114 bpm does note he feels a bit more fatigued with increasing the Toprol. Heart rate has been down. Does note he has been coughing.   EKGs/Labs/Other Studies Reviewed:   The following studies were reviewed today:  Echo 02/17/22  1.  Left ventricular ejection fraction, by estimation, is 45%. The left  ventricle has mildly decreased function. The left ventricle demonstrates  global hypokinesis. Left ventricular diastolic parameters are  indeterminate. Elevated left atrial pressure.  The average left ventricular global longitudinal strain is -11.4 %. The  global longitudinal strain is abnormal.   2. Right ventricular systolic function is normal. The right ventricular  size is normal. Tricuspid regurgitation signal is inadequate for assessing  PA pressure.   3. The mitral valve is abnormal. Trivial mitral valve regurgitation. No  evidence of mitral stenosis.   4. The aortic valve is tricuspid. There is mild calcification of the  aortic valve. There is mild thickening of the aortic valve. Aortic valve  regurgitation is moderate. No aortic stenosis is present.   5. Aortic dilatation noted. There is mild dilatation of the ascending  aorta, measuring 37 mm.   6. The inferior vena cava is normal in size with greater than 50%  respiratory variability, suggesting right atrial pressure of 3 mmHg.   Comparison(s): Echocardiogram done  02/24/21 showed an EF of 40-45%.   EKG:  No EKG today.  Recent Labs: 10/13/2021: B Natriuretic Peptide 137.0 11/03/2021: ALT 19; Magnesium 1.8 12/29/2021: BUN 16; Creatinine, Ser 0.98; Hemoglobin 15.2; Platelets 224; Potassium 3.8; Sodium 135  Recent Lipid Panel    Component Value Date/Time   CHOL 223 (H) 12/11/2013 0723   TRIG 122 12/11/2013 0723   HDL 36 (L) 12/11/2013 0723   CHOLHDL 6.2 12/11/2013 0723   VLDL 24 12/11/2013 0723   LDLCALC 163 (H) 12/11/2013 0723    Home Medications   Current Meds  Medication Sig   acetaminophen (TYLENOL) 650 MG CR tablet Take 650-1,300 mg by mouth every 8 (eight) hours as needed for pain.   ALPRAZolam (XANAX) 0.25 MG tablet Take 0.25 mg by mouth at bedtime as needed.   aspirin EC 81 MG tablet Take 81 mg by mouth in the morning.   atorvastatin (LIPITOR)  40 MG tablet Take 1 tablet (40 mg total) by mouth daily.   Cholecalciferol (VITAMIN D3) 125 MCG (5000 UT) TABS Take 1 tablet by mouth in the morning.   diazepam (VALIUM) 5 MG tablet Take 5 mg by mouth daily as needed (vertigo).   ENTRESTO 24-26 MG Take 1 tablet by mouth twice daily   furosemide (LASIX) 20 MG tablet Take 1 tablet (20 mg total) by mouth daily.   guaiFENesin (MUCINEX) 600 MG 12 hr tablet Take 600 mg by mouth daily as needed (congestion.).   JARDIANCE 10 MG TABS tablet Take 10 mg by mouth in the morning.   methocarbamol (ROBAXIN) 500 MG tablet Take 1 tablet (500 mg total) by mouth every 6 (six) hours as needed for muscle spasms.   metoprolol succinate (TOPROL-XL) 25 MG 24 hr tablet Take 1 tablet (25 mg total) by mouth daily. (Patient taking differently: Take 12.5 mg by mouth in the morning.)   naproxen sodium (ALEVE) 220 MG tablet Take 220-440 mg by mouth daily as needed (pain.).   omeprazole (PRILOSEC) 20 MG capsule Take 20 mg by mouth daily before breakfast.   ondansetron (ZOFRAN) 4 MG tablet Take 1 tablet (4 mg total) by mouth every 6 (six) hours as needed for nausea.   repaglinide (PRANDIN) 2 MG tablet Take 2 mg by mouth 3 (three) times daily before meals.    spironolactone (ALDACTONE) 25 MG tablet Take 1/2 (one-half) tablet by mouth once daily   zinc gluconate 50 MG tablet Take 50 mg by mouth in the morning.     Review of Systems      All other systems reviewed and are otherwise negative except as noted above.  Physical Exam    VS:  BP 108/70   Pulse 88   Ht '5\' 6"'$  (1.676 m)   Wt 187 lb 9.6 oz (85.1 kg)   BMI 30.28 kg/m  , BMI Body mass index is 30.28 kg/m.  Wt Readings from Last 3 Encounters:  05/23/22 187 lb 9.6 oz (85.1 kg)  02/13/22 186 lb 9.6 oz (84.6 kg)  01/02/22 181 lb (82.1 kg)    GEN: Well nourished, well developed, in no acute distress. HEENT: normal. Neck: Supple, no JVD, carotid bruits, or masses. Cardiac: RRR, no murmurs, rubs, or gallops. No  clubbing, cyanosis, edema.  Radials/PT 2+ and equal bilaterally.  Respiratory:  Respirations regular and unlabored, clear to auscultation bilaterally. LLL inspiratory crackles.  GI: Soft, nontender, nondistended. MS: No deformity or atrophy. Skin: Warm and dry, no rash. Neuro:  Strength and sensation are intact. Psych: Normal affect.  Assessment & Plan    HFrEF -02/2022 echo LVEF 45-50% similar compared to previous.  Notes 2 to 3-week history of worsening exertional dyspnea.  No edema, chest pain.  Does note some orthopnea.  Also upper respiratory symptoms with nasal congestion, sinus drainage.  LLL inspiratory Crackles noted on exam.  Chest x-ray a few days ago at primary care unremarkable per his report.  Increase Lasix to 20 mg twice daily for 3 days.  Will provide albuterol as needed for shortness of breath as this previously helped.  Consider etiology ischemia (work-up below), volume overload, pulmonary.  If aforementioned work-up unremarkable could consider limited echo to reassess LVEF. 05/2022 creatinine 1.09, GFR 72, Hb 15.5, K 4.7.  Nonobstructive CAD / HLD -No chest pain.  Does note some exertional dyspnea.  [place] 2020 with 40% RCA stenosis.  Update cardiac CTA due to exertional dyspnea and exercise intolerance concerning for anginal equivalent.  He will take ivabradine 10 mg 2 hours prior to study.  He is aware of use of nitroglycerin during study.  Present GDMT includes aspirin, atorvastatin, metoprolol, Jardiance.  Consider repeat lipid panel at follow-up as last is 31-year-old. 05/2021 LDL 81. 05/2022 ALT 16, AST 13.        Disposition: Follow up in 6 week(s) with [name], MD or APP.  Signed, Loel Dubonnet, NP 05/23/2022, 8:40 AM [place]

## 2022-05-23 NOTE — Patient Instructions (Signed)
Medication Instructions:  Your physician has recommended you make the following change in your medication:  Start: Lasix '20mg'$  twice daily for 3 days then resume lasix '20mg'$  daily   Start: Albuterol Inhaler as needed for shortness of breath    *If you need a refill on your cardiac medications before your next appointment, please call your pharmacy*  Testing/Procedures:   Your cardiac CT will be scheduled at one of the below locations:   [facility] [address] [place], [place] [address] [phone]   If scheduled at [facility], please arrive at the [facility] and Children's Entrance (Entrance C2) of [facility] 30 minutes prior to test start time. You can use the FREE valet parking offered at entrance C (encouraged to control the heart rate for the test)  Proceed to the [facility] Radiology Department (first floor) to check-in and test prep.  All radiology patients and guests should use entrance C2 at [facility], accessed from [facility], even though the hospital's physical address listed is [address]     Please follow these instructions carefully (unless otherwise directed):  Hold all erectile dysfunction medications at least 3 days (72 hrs) prior to test. (Ie viagra, cialis, sildenafil, tadalafil, etc) We will administer nitroglycerin during this exam.   On the Night Before the Test: Be sure to Drink plenty of water. Do not consume any caffeinated/decaffeinated beverages or chocolate 12 hours prior to your test. Do not take any antihistamines 12 hours prior to your test.  On the Day of the Test: Drink plenty of water until 1 hour prior to the test. Do not eat any food 1 hour prior to test. You may take your regular medications prior to the test.  Take Half tablet of Metoprolol Succinate and Ivabradine '10mg'$  two hours prior to cardiac CT.  HOLD Furosemide/Hydrochlorothiazide morning of the test.      After  the Test: Drink plenty of water. After receiving IV contrast, you may experience a mild flushed feeling. This is normal. On occasion, you may experience a mild rash up to 24 hours after the test. This is not dangerous. If this occurs, you can take Benadryl 25 mg and increase your fluid intake. If you experience trouble breathing, this can be serious. If it is severe call 911 IMMEDIATELY. If it is mild, please call our office. If you take any of these medications: Glipizide/Metformin, Avandament, Glucavance, please do not take 48 hours after completing test unless otherwise instructed.  We will call to schedule your test 2-4 weeks out understanding that some insurance companies will need an authorization prior to the service being performed.   For non-scheduling related questions, please contact the cardiac imaging nurse navigator should you have any questions/concerns: Marchia Bond, Cardiac Imaging Nurse Navigator Gordy Clement, Cardiac Imaging Nurse Navigator [facility] Direct Office Dial: [phone]   For scheduling needs, including cancellations and rescheduling, please call [place], [phone].    Follow-Up: At [facility], you and your health needs are our priority.  As part of our continuing mission to provide you with exceptional heart care, we have created designated Provider Care Teams.  These Care Teams include your primary Cardiologist (physician) and Advanced Practice Providers (APPs -  Physician Assistants and Nurse Practitioners) who all work together to provide you with the care you need, when you need it.  We recommend signing up for the patient portal called "MyChart".  Sign up information  is provided on this After Visit Summary.  MyChart is used to connect with patients for Virtual Visits (Telemedicine).  Patients are able to view lab/test results, encounter notes, upcoming appointments, etc.  Non-urgent messages can be sent to your  provider as well.   To learn more about what you can do with MyChart, go to [email or web address].    Your next appointment:   6 week(s)  The format for your next appointment:   In Person  Provider:   You may see [name], MD or one of the following Advanced Practice Providers on your designated Care Team:   Laurann Montana, NP   Other Instructions Heart Healthy Diet Recommendations: A low-salt diet is recommended. Meats should be grilled, baked, or boiled. Avoid fried foods. Focus on lean protein sources like fish or chicken with vegetables and fruits. The American Heart Association is a Microbiologist!  American Heart Association Diet and Lifeystyle Recommendations   Exercise recommendations: The American Heart Association recommends 150 minutes of moderate intensity exercise weekly. Try 30 minutes of moderate intensity exercise 4-5 times per week. This could include walking, jogging, or swimming.   Important Information About Sugar

## 2022-06-03 DIAGNOSIS — I25118 Atherosclerotic heart disease of native coronary artery with other forms of angina pectoris: Secondary | ICD-10-CM | POA: Diagnosis not present

## 2022-06-03 DIAGNOSIS — Z7189 Other specified counseling: Secondary | ICD-10-CM | POA: Diagnosis not present

## 2022-06-03 DIAGNOSIS — Z1339 Encounter for screening examination for other mental health and behavioral disorders: Secondary | ICD-10-CM | POA: Diagnosis not present

## 2022-06-03 DIAGNOSIS — I10 Essential (primary) hypertension: Secondary | ICD-10-CM | POA: Diagnosis not present

## 2022-06-03 DIAGNOSIS — E7800 Pure hypercholesterolemia, unspecified: Secondary | ICD-10-CM | POA: Diagnosis not present

## 2022-06-03 DIAGNOSIS — Z0000 Encounter for general adult medical examination without abnormal findings: Secondary | ICD-10-CM | POA: Diagnosis not present

## 2022-06-03 DIAGNOSIS — Z125 Encounter for screening for malignant neoplasm of prostate: Secondary | ICD-10-CM | POA: Diagnosis not present

## 2022-06-03 DIAGNOSIS — Z79899 Other long term (current) drug therapy: Secondary | ICD-10-CM | POA: Diagnosis not present

## 2022-06-03 DIAGNOSIS — I509 Heart failure, unspecified: Secondary | ICD-10-CM | POA: Diagnosis not present

## 2022-06-03 DIAGNOSIS — Z299 Encounter for prophylactic measures, unspecified: Secondary | ICD-10-CM | POA: Diagnosis not present

## 2022-06-03 DIAGNOSIS — Z1331 Encounter for screening for depression: Secondary | ICD-10-CM | POA: Diagnosis not present

## 2022-06-03 DIAGNOSIS — Z789 Other specified health status: Secondary | ICD-10-CM | POA: Diagnosis not present

## 2022-06-03 DIAGNOSIS — R5383 Other fatigue: Secondary | ICD-10-CM | POA: Diagnosis not present

## 2022-06-04 ENCOUNTER — Telehealth (HOSPITAL_COMMUNITY): Payer: Self-pay | Admitting: *Deleted

## 2022-06-04 NOTE — Telephone Encounter (Signed)
Reaching out to patient to offer assistance regarding upcoming cardiac imaging study; pt verbalizes understanding of appt date/time, parking situation and where to check in, medications ordered, and verified current allergies; name and call back number provided for further questions should they arise  Gordy Clement RN Navigator Cardiac Imaging [facility] [phone] office [phone] cell  Patient to take '10mg'$  ivabradine two hours prior to his cardiac CT scan. He is aware to arrive at 1:30pm.

## 2022-06-05 ENCOUNTER — Ambulatory Visit (HOSPITAL_COMMUNITY)
Admission: RE | Admit: 2022-06-05 | Discharge: 2022-06-05 | Disposition: A | Discharge status: Home / Self Care | Payer: Medicare PPO | Source: Ambulatory Visit | Attending: Family | Admitting: Family

## 2022-06-05 ENCOUNTER — Encounter (HOSPITAL_COMMUNITY): Payer: Self-pay

## 2022-06-05 DIAGNOSIS — R072 Precordial pain: Secondary | ICD-10-CM

## 2022-06-05 DIAGNOSIS — Z01812 Encounter for preprocedural laboratory examination: Secondary | ICD-10-CM

## 2022-06-05 DIAGNOSIS — R059 Cough, unspecified: Secondary | ICD-10-CM | POA: Diagnosis not present

## 2022-06-05 NOTE — Progress Notes (Signed)
HR 100-105. BP 971 systolic. Dr. Oval Linsey notified. Pt to be cancelled for today and will be rescheduled.

## 2022-06-06 ENCOUNTER — Emergency Department (HOSPITAL_COMMUNITY): Payer: Medicare PPO

## 2022-06-06 ENCOUNTER — Encounter (HOSPITAL_COMMUNITY): Payer: Self-pay | Admitting: Emergency Medicine

## 2022-06-06 ENCOUNTER — Inpatient Hospital Stay (HOSPITAL_COMMUNITY)
Admission: EM | Admit: 2022-06-06 | Discharge: 2022-06-10 | DRG: 193 | Disposition: A | Discharge status: Home / Self Care | Payer: Medicare PPO | Attending: Internal Medicine | Admitting: Internal Medicine

## 2022-06-06 DIAGNOSIS — J189 Pneumonia, unspecified organism: Principal | ICD-10-CM | POA: Diagnosis present

## 2022-06-06 DIAGNOSIS — Z8679 Personal history of other diseases of the circulatory system: Secondary | ICD-10-CM

## 2022-06-06 DIAGNOSIS — I5023 Acute on chronic systolic (congestive) heart failure: Secondary | ICD-10-CM | POA: Diagnosis present

## 2022-06-06 DIAGNOSIS — R0609 Other forms of dyspnea: Secondary | ICD-10-CM | POA: Diagnosis not present

## 2022-06-06 DIAGNOSIS — R0602 Shortness of breath: Principal | ICD-10-CM

## 2022-06-06 DIAGNOSIS — I10 Essential (primary) hypertension: Secondary | ICD-10-CM | POA: Diagnosis present

## 2022-06-06 DIAGNOSIS — Z9842 Cataract extraction status, left eye: Secondary | ICD-10-CM

## 2022-06-06 DIAGNOSIS — Z79899 Other long term (current) drug therapy: Secondary | ICD-10-CM

## 2022-06-06 DIAGNOSIS — E7800 Pure hypercholesterolemia, unspecified: Secondary | ICD-10-CM | POA: Diagnosis present

## 2022-06-06 DIAGNOSIS — Z974 Presence of external hearing-aid: Secondary | ICD-10-CM

## 2022-06-06 DIAGNOSIS — K219 Gastro-esophageal reflux disease without esophagitis: Secondary | ICD-10-CM | POA: Diagnosis present

## 2022-06-06 DIAGNOSIS — Z981 Arthrodesis status: Secondary | ICD-10-CM

## 2022-06-06 DIAGNOSIS — I509 Heart failure, unspecified: Secondary | ICD-10-CM | POA: Diagnosis not present

## 2022-06-06 DIAGNOSIS — J168 Pneumonia due to other specified infectious organisms: Secondary | ICD-10-CM | POA: Diagnosis not present

## 2022-06-06 DIAGNOSIS — Z87891 Personal history of nicotine dependence: Secondary | ICD-10-CM

## 2022-06-06 DIAGNOSIS — E785 Hyperlipidemia, unspecified: Secondary | ICD-10-CM | POA: Diagnosis present

## 2022-06-06 DIAGNOSIS — I110 Hypertensive heart disease with heart failure: Secondary | ICD-10-CM | POA: Diagnosis present

## 2022-06-06 DIAGNOSIS — Z20822 Contact with and (suspected) exposure to covid-19: Secondary | ICD-10-CM | POA: Diagnosis present

## 2022-06-06 DIAGNOSIS — Z7984 Long term (current) use of oral hypoglycemic drugs: Secondary | ICD-10-CM | POA: Diagnosis not present

## 2022-06-06 DIAGNOSIS — Z7982 Long term (current) use of aspirin: Secondary | ICD-10-CM | POA: Diagnosis not present

## 2022-06-06 DIAGNOSIS — Z9841 Cataract extraction status, right eye: Secondary | ICD-10-CM

## 2022-06-06 DIAGNOSIS — I2510 Atherosclerotic heart disease of native coronary artery without angina pectoris: Secondary | ICD-10-CM | POA: Diagnosis present

## 2022-06-06 DIAGNOSIS — E1110 Type 2 diabetes mellitus with ketoacidosis without coma: Secondary | ICD-10-CM | POA: Diagnosis present

## 2022-06-06 DIAGNOSIS — C9241 Acute promyelocytic leukemia, in remission: Secondary | ICD-10-CM | POA: Diagnosis present

## 2022-06-06 DIAGNOSIS — I428 Other cardiomyopathies: Secondary | ICD-10-CM | POA: Diagnosis present

## 2022-06-06 DIAGNOSIS — Z888 Allergy status to other drugs, medicaments and biological substances status: Secondary | ICD-10-CM

## 2022-06-06 DIAGNOSIS — E1142 Type 2 diabetes mellitus with diabetic polyneuropathy: Secondary | ICD-10-CM | POA: Diagnosis present

## 2022-06-06 DIAGNOSIS — J811 Chronic pulmonary edema: Secondary | ICD-10-CM | POA: Diagnosis not present

## 2022-06-06 DIAGNOSIS — G479 Sleep disorder, unspecified: Secondary | ICD-10-CM | POA: Diagnosis present

## 2022-06-06 DIAGNOSIS — E782 Mixed hyperlipidemia: Secondary | ICD-10-CM | POA: Diagnosis not present

## 2022-06-06 DIAGNOSIS — J90 Pleural effusion, not elsewhere classified: Secondary | ICD-10-CM | POA: Diagnosis not present

## 2022-06-06 DIAGNOSIS — Z8711 Personal history of peptic ulcer disease: Secondary | ICD-10-CM | POA: Diagnosis not present

## 2022-06-06 LAB — URINALYSIS, ROUTINE W REFLEX MICROSCOPIC
Bacteria, UA: NONE SEEN
Bilirubin Urine: NEGATIVE
Glucose, UA: >=500 mg/dL — AB
Ketones, ur: 80 mg/dL — AB
Leukocytes,Ua: NEGATIVE
Nitrite: NEGATIVE
Protein, ur: 30 mg/dL — AB
Specific Gravity, Urine: 1.031 — ABNORMAL HIGH (ref 1.005–1.030)
pH: 5 (ref 5.0–8.0)

## 2022-06-06 LAB — BASIC METABOLIC PANEL
Anion gap: 14 (ref 5–15)
BUN: 16 mg/dL (ref 8–23)
CO2: 21 mmol/L — ABNORMAL LOW (ref 22–32)
Calcium: 9.5 mg/dL (ref 8.9–10.3)
Chloride: 103 mmol/L (ref 98–111)
Creatinine, Ser: 1.17 mg/dL (ref 0.61–1.24)
GFR, Estimated: >60 mL/min (ref >60)
Glucose, Bld: 206 mg/dL — ABNORMAL HIGH (ref 70–99)
Potassium: 4.3 mmol/L (ref 3.5–5.1)
Sodium: 138 mmol/L (ref 135–145)

## 2022-06-06 LAB — CBC
HCT: 44.9 % (ref 39.0–52.0)
Hemoglobin: 14.9 g/dL (ref 13.0–17.0)
MCH: 31.6 pg (ref 26.0–34.0)
MCHC: 33.2 g/dL (ref 30.0–36.0)
MCV: 95.1 fL (ref 80.0–100.0)
Platelets: 243 10*3/uL (ref 150–400)
RBC: 4.72 MIL/uL (ref 4.22–5.81)
RDW: 15.1 % (ref 11.5–15.5)
WBC: 12 10*3/uL — ABNORMAL HIGH (ref 4.0–10.5)
nRBC: 0 % (ref 0.0–0.2)

## 2022-06-06 LAB — TROPONIN I (HIGH SENSITIVITY)
Troponin I (High Sensitivity): 19 ng/L — ABNORMAL HIGH (ref <18)
Troponin I (High Sensitivity): 31 ng/L — ABNORMAL HIGH (ref <18)

## 2022-06-06 LAB — BRAIN NATRIURETIC PEPTIDE: B Natriuretic Peptide: 1624.1 pg/mL — ABNORMAL HIGH (ref 0.0–100.0)

## 2022-06-06 LAB — D-DIMER, QUANTITATIVE: D-Dimer, Quant: 0.69 ug/mL-FEU — ABNORMAL HIGH (ref 0.00–0.50)

## 2022-06-06 MED ORDER — FUROSEMIDE 20 MG PO TABS: ORAL_TABLET | ORAL | 0 refills | Status: DC

## 2022-06-06 MED ORDER — FUROSEMIDE 10 MG/ML IJ SOLN
40.0000 mg | Freq: Once | INTRAMUSCULAR | Status: AC
  Administered 2022-06-06: 40 mg via INTRAVENOUS
  Filled 2022-06-06: qty 4

## 2022-06-06 MED ORDER — POTASSIUM CHLORIDE ER 10 MEQ PO TBCR: 10.0000 meq | EXTENDED_RELEASE_TABLET | Freq: Every day | ORAL | 0 refills | Status: DC

## 2022-06-06 MED ORDER — IOHEXOL 350 MG/ML SOLN
80.0000 mL | Freq: Once | INTRAVENOUS | Status: AC | PRN
  Administered 2022-06-06: 80 mL via INTRAVENOUS

## 2022-06-06 NOTE — ED Triage Notes (Signed)
Patient here with complaint of intermittent shortness of breath that started several months ago. Patient is alert, oriented, and speaking in complete sentences.

## 2022-06-06 NOTE — Discharge Instructions (Addendum)
I recommend you increase your Lasix to 40 mg in the morning and 20 mg in the evening for the next 7 days.  Call your cardiology office to discuss this visit and plan.  One of your doctor's offices should recheck your BMP (kidney function, potassium) in 1 week with this increase in your lasix.

## 2022-06-06 NOTE — ED Provider Triage Note (Signed)
Emergency Medicine Provider Triage Evaluation Note  Ricardo Zuniga , a 71 y.o. male  was evaluated in triage.  Pt complains of sob intermittently for several months. States 7-8 times a day. Episodes last moments.   No CP but states some chest tightness.   No fevers, LH, Dizziness.   No recent surgeries, hospitalization, long travel, hemoptysis, estrogen containing OCP, cancer history.  No unilateral leg swelling.  No history of PE or VTE.   Takes lasix daily.   States he gets very SOB when he lays down. Sleeps reclined.   Review of Systems  Positive: SOB Negative: Fever   Physical Exam  BP 123/66 (BP Location: Left Arm)   Pulse (!) 119   Temp 98.2 F (36.8 C) (Oral)   Resp 16   SpO2 96%  Gen:   Awake, no distress   Resp:  Normal effort  MSK:   Moves extremities without difficulty  Other:  BL foot swelling. No sign edema of calves, faint crackles.   Medical Decision Making  Medically screening exam initiated at 4:36 PM.  Appropriate orders placed.  Ricardo Zuniga was informed that the remainder of the evaluation will be completed by another provider, this initial triage assessment does not replace that evaluation, and the importance of remaining in the ED until their evaluation is complete.  Labs, cxr ekg   Ricardo Zuniga [place], [place] 06/06/22 1642

## 2022-06-06 NOTE — ED Provider Notes (Signed)
[facility] EMERGENCY DEPARTMENT Provider Note   CSN: [id number] Arrival date & time: 06/06/22  1520     History  Chief Complaint  Patient presents with   Shortness of Breath    Ricardo Zuniga is a 71 y.o. male with a history congestive heart failure, on 20 mg Lasix, presented ED with shortness of breath.  He is accompanied by his wife who provides supplemental history.  He reports he has been short of breath for the past month, but abruptly worsening in the past 1 to 2 weeks.  He describes orthopnea, difficulty sleeping, minor leg swelling, dyspnea on exertion.  His wife reports that he has a subjective fever at home, although he denies productive cough or hemoptysis.  Denies history of DVT or PE.  He states he went to an urgent care yesterday where he had a negative COVID and flu test.  He reports he has a chronic baseline tachycardia.  He said this was the reason they cannot perform an outpatient coronary CT scan yesterday, because of his elevated heart rate.   His last heart cath, left, in Feb 2020 with single vessel disease, 40% occlusion prox RCA  External records reviewed, last echocardiogram July 2023, 3 months ago, impression:  IMPRESSIONS     1. Left ventricular ejection fraction, by estimation, is 45%. The left  ventricle has mildly decreased function. The left ventricle demonstrates  global hypokinesis. Left ventricular diastolic parameters are  indeterminate. Elevated left atrial pressure.  The average left ventricular global longitudinal strain is -11.4 %. The  global longitudinal strain is abnormal.   2. Right ventricular systolic function is normal. The right ventricular  size is normal. Tricuspid regurgitation signal is inadequate for assessing  PA pressure.   3. The mitral valve is abnormal. Trivial mitral valve regurgitation. No  evidence of mitral stenosis.   4. The aortic valve is tricuspid. There is mild calcification of the  aortic  valve. There is mild thickening of the aortic valve. Aortic valve  regurgitation is moderate. No aortic stenosis is present.   5. Aortic dilatation noted. There is mild dilatation of the ascending  aorta, measuring 37 mm.   6. The inferior vena cava is normal in size with greater than 50%  respiratory variability, suggesting right atrial pressure of 3 mmHg.   HPI     Home Medications Prior to Admission medications   Medication Sig Start Date End Date Taking? Authorizing Provider  furosemide (LASIX) 20 MG tablet Take 40 mg by mouth in the morning, and 20 mg by mouth in the evening, for 7 days 06/06/22  Yes Drako Maese, Carola Rhine, MD  potassium chloride (KLOR-CON) 10 MEQ tablet Take 1 tablet (10 mEq total) by mouth daily for 30 doses. 06/06/22 07/06/22 Yes Aislee Landgren, Carola Rhine, MD  acetaminophen (TYLENOL) 650 MG CR tablet Take 650-1,300 mg by mouth every 8 (eight) hours as needed for pain.    [provider]  albuterol (VENTOLIN HFA) 108 (90 Base) MCG/ACT inhaler Inhale 2 puffs into the lungs every 6 (six) hours as needed for wheezing or shortness of breath. 05/23/22   Loel Dubonnet, NP  ALPRAZolam Duanne Moron) 0.25 MG tablet Take 0.25 mg by mouth at bedtime as needed. 05/13/22   [provider]  aspirin EC 81 MG tablet Take 81 mg by mouth in the morning.    [provider]  atorvastatin (LIPITOR) 40 MG tablet Take 1 tablet (40 mg total) by mouth daily. 03/21/22   Domenic Polite,  Aloha Gell, MD  Cholecalciferol (VITAMIN D3) 125 MCG (5000 UT) TABS Take 1 tablet by mouth in the morning.    [provider]  diazepam (VALIUM) 5 MG tablet Take 5 mg by mouth daily as needed (vertigo). 10/17/21   [provider]  ENTRESTO 24-26 MG Take 1 tablet by mouth twice daily 02/24/22   Satira Sark, MD  furosemide (LASIX) 20 MG tablet Take 1 tablet (20 mg total) by mouth daily. 02/28/22   Satira Sark, MD  guaiFENesin (MUCINEX) 600 MG 12 hr tablet Take 600 mg by mouth daily as  needed (congestion.).    [provider]  JARDIANCE 10 MG TABS tablet Take 10 mg by mouth in the morning. 12/18/21   [provider]  methocarbamol (ROBAXIN) 500 MG tablet Take 1 tablet (500 mg total) by mouth every 6 (six) hours as needed for muscle spasms. 01/03/22   Kristeen Miss, MD  metoprolol succinate (TOPROL-XL) 25 MG 24 hr tablet Take 1 tablet (25 mg total) by mouth daily. Patient taking differently: Take 12.5 mg by mouth in the morning. 03/18/21   Verta Ellen., NP  naproxen sodium (ALEVE) 220 MG tablet Take 220-440 mg by mouth daily as needed (pain.).    [provider]  omeprazole (PRILOSEC) 20 MG capsule Take 20 mg by mouth daily before breakfast.    [provider]  ondansetron (ZOFRAN) 4 MG tablet Take 1 tablet (4 mg total) by mouth every 6 (six) hours as needed for nausea. 11/03/21   Raiford Noble Latif, DO  repaglinide (PRANDIN) 2 MG tablet Take 2 mg by mouth 3 (three) times daily before meals.  07/06/17   [provider]  spironolactone (ALDACTONE) 25 MG tablet Take 1/2 (one-half) tablet by mouth once daily 03/19/22   Satira Sark, MD  zinc gluconate 50 MG tablet Take 50 mg by mouth in the morning.    [provider]      Allergies    Lisinopril    Review of Systems   Review of Systems  Physical Exam Updated Vital Signs BP 115/67 (BP Location: Right Arm)   Pulse (!) 104   Temp 99.8 F (37.7 C)   Resp 18   SpO2 94%  Physical Exam Constitutional:      General: He is not in acute distress. HENT:     Head: Normocephalic and atraumatic.  Eyes:     Conjunctiva/sclera: Conjunctivae normal.     Pupils: Pupils are equal, round, and reactive to light.  Cardiovascular:     Rate and Rhythm: Regular rhythm. Tachycardia present.  Pulmonary:     Effort: Pulmonary effort is normal. No respiratory distress.     Comments: 95% on room air, speaking comfortably in full sentences Crackles in the mid and lower lung  fields Abdominal:     General: There is no distension.     Tenderness: There is no abdominal tenderness.  Musculoskeletal:     Comments: Trace symmetrical pitting edema to the mid tibia  Skin:    General: Skin is warm and dry.  Neurological:     General: No focal deficit present.     Mental Status: He is alert. Mental status is at baseline.  Psychiatric:        Mood and Affect: Mood normal.        Behavior: Behavior normal.     ED Results / Procedures / Treatments   Labs (all labs ordered are listed, but only abnormal results  are displayed) Labs Reviewed  BASIC METABOLIC PANEL - Abnormal; Notable for the following components:      Result Value   CO2 21 (*)    Glucose, Bld 206 (*)    All other components within normal limits  CBC - Abnormal; Notable for the following components:   WBC 12.0 (*)    All other components within normal limits  D-DIMER, QUANTITATIVE - Abnormal; Notable for the following components:   D-Dimer, Quant 0.69 (*)    All other components within normal limits  BRAIN NATRIURETIC PEPTIDE - Abnormal; Notable for the following components:   B Natriuretic Peptide 1,624.1 (*)    All other components within normal limits  TROPONIN I (HIGH SENSITIVITY) - Abnormal; Notable for the following components:   Troponin I (High Sensitivity) 19 (*)    All other components within normal limits  TROPONIN I (HIGH SENSITIVITY) - Abnormal; Notable for the following components:   Troponin I (High Sensitivity) 31 (*)    All other components within normal limits  URINALYSIS, ROUTINE W REFLEX MICROSCOPIC    EKG EKG Interpretation  Date/Time:  Friday June 06 2022 16:47:59 EDT Ventricular Rate:  119 PR Interval:  164 QRS Duration: 80 QT Interval:  318 QTC Calculation: 447 R Axis:   75 Text Interpretation: Sinus tachycardia Low voltage QRS Borderline ECG When compared with ECG of 30-Oct-2021 14:42, PREVIOUS ECG IS PRESENT No sig changes Confirmed by Octaviano Glow  [phone]) on 06/06/2022 9:42:04 PM  Radiology DG Chest 2 View  Result Date: 06/06/2022 CLINICAL DATA:  Shortness of breath EXAM: CHEST - 2 VIEW COMPARISON:  Chest x-ray 05/20/2022 FINDINGS: Heart is mildly enlarged. There is central pulmonary vascular congestion. The lungs are clear. There is no pleural effusion or pneumothorax. There surgical clips in the upper abdomen. No acute fractures are seen. IMPRESSION: Mild cardiomegaly with central pulmonary vascular congestion. Electronically Signed   By: Ronney Asters M.D.   On: 06/06/2022 17:12    Procedures Procedures    Medications Ordered in ED Medications  furosemide (LASIX) injection 40 mg (40 mg Intravenous Given 06/06/22 2305)    ED Course/ Medical Decision Making/ A&P Clinical Course as of 06/06/22 2312  Fri Jun 06, 2022  2309 Signed out to Dr Roxanne Mins. [MT]    Clinical Course User Index [MT] Kvon Mcilhenny, Carola Rhine, MD                           Medical Decision Making Amount and/or Complexity of Data Reviewed Labs: ordered. Radiology: ordered.  Risk Prescription drug management.   This patient presents to the ED with concern for short of breath. This involves an extensive number of treatment options, and is a complaint that carries with it a high risk of complications and morbidity.  The differential diagnosis includes COPD versus PE versus pneumonia versus pneumothorax versus congestive heart failure versus other  Clinically his presentation is most consistent with congestive heart failure exacerbation with pulmonary edema.  We will look for secondary cause of this.  Co-morbidities that complicate the patient evaluation: History of congestive heart failure at high risk for CHF exacerbation  Additional history obtained from the patient's wife at bedside  External records from outside source obtained and reviewed including most recent echocardiogram results, last left heart catheterization  I ordered and personally interpreted  labs.  The pertinent results include: BNP is elevated above baseline.  Remainder of labs appear to be near baseline level.  Mild leukocytosis.  I ordered imaging studies including x-ray of the chest, CT PE I independently visualized and interpreted imaging which showed Xray with mild cardiomegaly and pulmonary congestion, no other significant findings.  CT PE pending at the time of signout I agree with the radiologist interpretation  The patient was maintained on a cardiac monitor.  I personally viewed and interpreted the cardiac monitored which showed an underlying rhythm of: Sinus tachycardia  Per my interpretation the patient's ECG shows sinus tachycardia with no acute ischemic findings  I ordered medication including IV Lasix for diuresis  I have reviewed the patients home medicines and have made adjustments as needed  Signed out pending IV diuresis, CT PE, to Dr Roxanne Mins.         Final Clinical Impression(s) / ED Diagnoses Final diagnoses:  Shortness of breath  Acute on chronic congestive heart failure, unspecified heart failure type ([place])    Rx / DC Orders ED Discharge Orders          Ordered    potassium chloride (KLOR-CON) 10 MEQ tablet  Daily        06/06/22 2312    furosemide (LASIX) 20 MG tablet        06/06/22 2312              Wyvonnia Dusky, MD 06/06/22 2314

## 2022-06-06 NOTE — ED Provider Notes (Signed)
Care assumed from Dr. Langston Masker, patient with probable CHF exacerbation, pending CTA chest.  CT angiogram does not show evidence of pulmonary embolism, but does show a right upper lobe infiltrate.  Also, small right pleural effusion is present, possibly parapneumonic, possibly secondary to heart failure.  I have independently viewed the images, and agree with the radiologist's interpretation.  Patient has been having fevers at home, so this does fit his clinical picture.  However, dyspnea is been worsening over a month and I doubt that the pneumonia has been present for a month.  BNP is significantly elevated and I think that the main source of his dyspnea is his heart failure exacerbation.  Troponin was mildly elevated and has increased on repeat.  Therefore, I feel that he will need to be treated as an inpatient.  I have ordered ceftriaxone and azithromycin to treat community-acquired pneumonia.  He has had about 800 mL of urine output since getting intravenous furosemide.  Case is discussed with Dr. Crista Luria of [facility], who agrees to admit the patient.  Results for orders placed or performed during the hospital encounter of 17/79/39  Basic metabolic panel  Result Value Ref Range   Sodium 138 135 - 145 mmol/L   Potassium 4.3 3.5 - 5.1 mmol/L   Chloride 103 98 - 111 mmol/L   CO2 21 (L) 22 - 32 mmol/L   Glucose, Bld 206 (H) 70 - 99 mg/dL   BUN 16 8 - 23 mg/dL   Creatinine, Ser 1.17 0.61 - 1.24 mg/dL   Calcium 9.5 8.9 - 10.3 mg/dL   GFR, Estimated >60 >60 mL/min   Anion gap 14 5 - 15  CBC  Result Value Ref Range   WBC 12.0 (H) 4.0 - 10.5 K/uL   RBC 4.72 4.22 - 5.81 MIL/uL   Hemoglobin 14.9 13.0 - 17.0 g/dL   HCT 44.9 39.0 - 52.0 %   MCV 95.1 80.0 - 100.0 fL   MCH 31.6 26.0 - 34.0 pg   MCHC 33.2 30.0 - 36.0 g/dL   RDW 15.1 11.5 - 15.5 %   Platelets 243 150 - 400 K/uL   nRBC 0.0 0.0 - 0.2 %  D-dimer, quantitative  Result Value Ref Range   D-Dimer, Quant 0.69 (H) 0.00 - 0.50  ug/mL-FEU  Brain natriuretic peptide  Result Value Ref Range   B Natriuretic Peptide 1,624.1 (H) 0.0 - 100.0 pg/mL  Urinalysis, Routine w reflex microscopic  Result Value Ref Range   Color, Urine YELLOW YELLOW   APPearance CLEAR CLEAR   Specific Gravity, Urine 1.031 (H) 1.005 - 1.030   pH 5.0 5.0 - 8.0   Glucose, UA >=500 (A) NEGATIVE mg/dL   Hgb urine dipstick SMALL (A) NEGATIVE   Bilirubin Urine NEGATIVE NEGATIVE   Ketones, ur 80 (A) NEGATIVE mg/dL   Protein, ur 30 (A) NEGATIVE mg/dL   Nitrite NEGATIVE NEGATIVE   Leukocytes,Ua NEGATIVE NEGATIVE   RBC / HPF 0-5 0 - 5 RBC/hpf   WBC, UA 0-5 0 - 5 WBC/hpf   Bacteria, UA NONE SEEN NONE SEEN   Mucus PRESENT   Troponin I (High Sensitivity)  Result Value Ref Range   Troponin I (High Sensitivity) 19 (H) <18 ng/L  Troponin I (High Sensitivity)  Result Value Ref Range   Troponin I (High Sensitivity) 31 (H) <18 ng/L   CT Angio Chest PE W and/or Wo Contrast  Result Date: 06/06/2022 CLINICAL DATA:  Suspected pulmonary embolism. EXAM: CT ANGIOGRAPHY CHEST WITH CONTRAST TECHNIQUE: Multidetector CT  imaging of the chest was performed using the standard protocol during bolus administration of intravenous contrast. Multiplanar CT image reconstructions and MIPs were obtained to evaluate the vascular anatomy. RADIATION DOSE REDUCTION: This exam was performed according to the departmental dose-optimization program which includes automated exposure control, adjustment of the mA and/or kV according to patient size and/or use of iterative reconstruction technique. CONTRAST:  58m OMNIPAQUE IOHEXOL 350 MG/ML SOLN COMPARISON:  None Available. FINDINGS: Cardiovascular: There is mild calcification of the aortic arch, without evidence of aortic aneurysm. Satisfactory opacification of the pulmonary arteries to the segmental level. No evidence of pulmonary embolism. There is mild cardiomegaly and mild coronary artery calcification. No pericardial effusion.  Mediastinum/Nodes: There is mild AP window, pretracheal and subcarinal lymphadenopathy. Thyroid gland, trachea, and esophagus demonstrate no significant findings. Lungs/Pleura: Mild to moderate severity patchy posterior right upper lobe infiltrate is seen. Mild areas of atelectasis and/or early infiltrate are also seen within the anterior aspect of the left upper lobe and posterior aspects of the bilateral lung bases. There is a small right pleural effusion. No pneumothorax is identified. Upper Abdomen: No acute abnormality. Musculoskeletal: No chest wall abnormality. No acute or significant osseous findings. Review of the MIP images confirms the above findings. IMPRESSION: 1. No evidence of pulmonary embolism. 2. Mild to moderate severity posterior right upper lobe infiltrate. 3. Mild left upper lobe and bibasilar atelectasis and/or early infiltrate. 4. Small right pleural effusion. 5. Mild cardiomegaly and mild coronary artery calcification. 6. Aortic atherosclerosis. Aortic Atherosclerosis (ICD10-I70.0). Electronically Signed   By: TVirgina NorfolkM.D.   On: 06/06/2022 23:43   DG Chest 2 View  Result Date: 06/06/2022 CLINICAL DATA:  Shortness of breath EXAM: CHEST - 2 VIEW COMPARISON:  Chest x-ray 05/20/2022 FINDINGS: Heart is mildly enlarged. There is central pulmonary vascular congestion. The lungs are clear. There is no pleural effusion or pneumothorax. There surgical clips in the upper abdomen. No acute fractures are seen. IMPRESSION: Mild cardiomegaly with central pulmonary vascular congestion. Electronically Signed   By: ARonney AstersM.D.   On: 06/06/2022 17:12   DG Chest 2 View  Result Date: 05/21/2022 CLINICAL DATA:  Shortness of breath. EXAM: CHEST - 2 VIEW COMPARISON:  October 30, 2021 FINDINGS: The heart size and mediastinal contours are within normal limits. Mild cephalization of flow is identified. There is no pulmonary edema, focal pneumonia or pleural effusion. The visualized skeletal  structures are stable. Prior cholecystectomy clips are noted. IMPRESSION: Mild cephalization of flow is identified without pulmonary edema. Electronically Signed   By: WAbelardo DieselM.D.   On: 140/[id number]     GDelora Fuel MD [id number]

## 2022-06-07 ENCOUNTER — Encounter (HOSPITAL_COMMUNITY): Payer: Self-pay | Admitting: Internal Medicine

## 2022-06-07 ENCOUNTER — Other Ambulatory Visit: Payer: Self-pay

## 2022-06-07 DIAGNOSIS — I428 Other cardiomyopathies: Secondary | ICD-10-CM | POA: Diagnosis present

## 2022-06-07 DIAGNOSIS — Z20822 Contact with and (suspected) exposure to covid-19: Secondary | ICD-10-CM | POA: Diagnosis present

## 2022-06-07 DIAGNOSIS — J189 Pneumonia, unspecified organism: Secondary | ICD-10-CM | POA: Diagnosis present

## 2022-06-07 DIAGNOSIS — I5023 Acute on chronic systolic (congestive) heart failure: Secondary | ICD-10-CM | POA: Diagnosis present

## 2022-06-07 DIAGNOSIS — I509 Heart failure, unspecified: Secondary | ICD-10-CM | POA: Diagnosis not present

## 2022-06-07 DIAGNOSIS — G479 Sleep disorder, unspecified: Secondary | ICD-10-CM | POA: Diagnosis present

## 2022-06-07 DIAGNOSIS — E782 Mixed hyperlipidemia: Secondary | ICD-10-CM

## 2022-06-07 DIAGNOSIS — Z79899 Other long term (current) drug therapy: Secondary | ICD-10-CM | POA: Diagnosis not present

## 2022-06-07 DIAGNOSIS — Z8679 Personal history of other diseases of the circulatory system: Secondary | ICD-10-CM | POA: Diagnosis not present

## 2022-06-07 DIAGNOSIS — E1142 Type 2 diabetes mellitus with diabetic polyneuropathy: Secondary | ICD-10-CM | POA: Diagnosis present

## 2022-06-07 DIAGNOSIS — R0609 Other forms of dyspnea: Secondary | ICD-10-CM | POA: Diagnosis not present

## 2022-06-07 DIAGNOSIS — K219 Gastro-esophageal reflux disease without esophagitis: Secondary | ICD-10-CM | POA: Diagnosis present

## 2022-06-07 DIAGNOSIS — Z974 Presence of external hearing-aid: Secondary | ICD-10-CM | POA: Diagnosis not present

## 2022-06-07 DIAGNOSIS — Z9842 Cataract extraction status, left eye: Secondary | ICD-10-CM | POA: Diagnosis not present

## 2022-06-07 DIAGNOSIS — Z981 Arthrodesis status: Secondary | ICD-10-CM | POA: Diagnosis not present

## 2022-06-07 DIAGNOSIS — Z9841 Cataract extraction status, right eye: Secondary | ICD-10-CM | POA: Diagnosis not present

## 2022-06-07 DIAGNOSIS — E1110 Type 2 diabetes mellitus with ketoacidosis without coma: Secondary | ICD-10-CM

## 2022-06-07 DIAGNOSIS — C9241 Acute promyelocytic leukemia, in remission: Secondary | ICD-10-CM | POA: Diagnosis present

## 2022-06-07 DIAGNOSIS — I10 Essential (primary) hypertension: Secondary | ICD-10-CM

## 2022-06-07 DIAGNOSIS — I110 Hypertensive heart disease with heart failure: Secondary | ICD-10-CM | POA: Diagnosis present

## 2022-06-07 DIAGNOSIS — Z7984 Long term (current) use of oral hypoglycemic drugs: Secondary | ICD-10-CM | POA: Diagnosis not present

## 2022-06-07 DIAGNOSIS — Z7982 Long term (current) use of aspirin: Secondary | ICD-10-CM | POA: Diagnosis not present

## 2022-06-07 DIAGNOSIS — E7800 Pure hypercholesterolemia, unspecified: Secondary | ICD-10-CM | POA: Diagnosis present

## 2022-06-07 DIAGNOSIS — Z8711 Personal history of peptic ulcer disease: Secondary | ICD-10-CM | POA: Diagnosis not present

## 2022-06-07 DIAGNOSIS — Z87891 Personal history of nicotine dependence: Secondary | ICD-10-CM | POA: Diagnosis not present

## 2022-06-07 DIAGNOSIS — R0602 Shortness of breath: Secondary | ICD-10-CM | POA: Diagnosis present

## 2022-06-07 DIAGNOSIS — I2510 Atherosclerotic heart disease of native coronary artery without angina pectoris: Secondary | ICD-10-CM | POA: Diagnosis present

## 2022-06-07 DIAGNOSIS — Z888 Allergy status to other drugs, medicaments and biological substances status: Secondary | ICD-10-CM | POA: Diagnosis not present

## 2022-06-07 LAB — CBG MONITORING, ED
Glucose-Capillary: 176 mg/dL — ABNORMAL HIGH (ref 70–99)
Glucose-Capillary: 121 mg/dL — ABNORMAL HIGH (ref 70–99)
Glucose-Capillary: 114 mg/dL — ABNORMAL HIGH (ref 70–99)
Glucose-Capillary: 114 mg/dL — ABNORMAL HIGH (ref 70–99)
Glucose-Capillary: 254 mg/dL — ABNORMAL HIGH (ref 70–99)

## 2022-06-07 LAB — RESPIRATORY PANEL BY PCR

## 2022-06-07 LAB — BASIC METABOLIC PANEL
Anion gap: 13 (ref 5–15)
BUN: 14 mg/dL (ref 8–23)
CO2: 23 mmol/L (ref 22–32)
Calcium: 8.8 mg/dL — ABNORMAL LOW (ref 8.9–10.3)
Chloride: 100 mmol/L (ref 98–111)
Creatinine, Ser: 1.03 mg/dL (ref 0.61–1.24)
GFR, Estimated: >60 mL/min (ref >60)
Glucose, Bld: 116 mg/dL — ABNORMAL HIGH (ref 70–99)
Potassium: 3.7 mmol/L (ref 3.5–5.1)
Sodium: 136 mmol/L (ref 135–145)

## 2022-06-07 LAB — HIV ANTIBODY (ROUTINE TESTING W REFLEX): HIV Screen 4th Generation wRfx: NONREACTIVE

## 2022-06-07 LAB — GLUCOSE, CAPILLARY: Glucose-Capillary: 195 mg/dL — ABNORMAL HIGH (ref 70–99)

## 2022-06-07 LAB — PROCALCITONIN: Procalcitonin: <0.1 ng/mL

## 2022-06-07 LAB — BETA-HYDROXYBUTYRIC ACID: Beta-Hydroxybutyric Acid: 0.74 mmol/L — ABNORMAL HIGH (ref 0.05–0.27)

## 2022-06-07 LAB — HEMOGLOBIN A1C
Hgb A1c MFr Bld: 6.6 % — ABNORMAL HIGH (ref 4.8–5.6)
Mean Plasma Glucose: 142.72 mg/dL

## 2022-06-07 LAB — STREP PNEUMONIAE URINARY ANTIGEN: Strep Pneumo Urinary Antigen: NEGATIVE

## 2022-06-07 MED ORDER — PANTOPRAZOLE SODIUM 40 MG PO TBEC
40.0000 mg | DELAYED_RELEASE_TABLET | Freq: Every day | ORAL | Status: DC
  Administered 2022-06-07 – 2022-06-10 (×4): 40 mg via ORAL
  Filled 2022-06-07 (×4): qty 1

## 2022-06-07 MED ORDER — ACETAMINOPHEN 325 MG PO TABS
650.0000 mg | ORAL_TABLET | ORAL | Status: DC | PRN
  Administered 2022-06-08 – 2022-06-09 (×3): 650 mg via ORAL
  Filled 2022-06-07 (×3): qty 2

## 2022-06-07 MED ORDER — ENOXAPARIN SODIUM 40 MG/0.4ML IJ SOSY
40.0000 mg | PREFILLED_SYRINGE | INTRAMUSCULAR | Status: DC
  Administered 2022-06-07 – 2022-06-09 (×3): 40 mg via SUBCUTANEOUS
  Filled 2022-06-07 (×3): qty 0.4

## 2022-06-07 MED ORDER — SODIUM CHLORIDE 0.9 % IV SOLN: 250.0000 mL | INTRAVENOUS | Status: DC | PRN

## 2022-06-07 MED ORDER — EMPAGLIFLOZIN 10 MG PO TABS: 10.0000 mg | ORAL_TABLET | Freq: Every morning | ORAL | Status: DC

## 2022-06-07 MED ORDER — INSULIN REGULAR(HUMAN) IN NACL 100-0.9 UT/100ML-% IV SOLN
INTRAVENOUS | Status: DC
  Administered 2022-06-07: 5 [IU]/h via INTRAVENOUS
  Filled 2022-06-07: qty 100

## 2022-06-07 MED ORDER — REPAGLINIDE 2 MG PO TABS: 2.0000 mg | ORAL_TABLET | Freq: Three times a day (TID) | ORAL | Status: DC

## 2022-06-07 MED ORDER — ASPIRIN 81 MG PO TBEC
81.0000 mg | DELAYED_RELEASE_TABLET | Freq: Every morning | ORAL | Status: DC
  Administered 2022-06-07 – 2022-06-10 (×3): 81 mg via ORAL
  Filled 2022-06-07 (×4): qty 1

## 2022-06-07 MED ORDER — DEXTROSE 50 % IV SOLN: 0.0000 mL | INTRAVENOUS | Status: DC | PRN

## 2022-06-07 MED ORDER — DEXTROSE IN LACTATED RINGERS 5 % IV SOLN: INTRAVENOUS | Status: DC

## 2022-06-07 MED ORDER — SACUBITRIL-VALSARTAN 24-26 MG PO TABS
1.0000 | ORAL_TABLET | Freq: Two times a day (BID) | ORAL | Status: DC
  Administered 2022-06-07 – 2022-06-10 (×7): 1 via ORAL
  Filled 2022-06-07 (×8): qty 1

## 2022-06-07 MED ORDER — METHOCARBAMOL 500 MG PO TABS: 500.0000 mg | ORAL_TABLET | Freq: Four times a day (QID) | ORAL | Status: DC | PRN

## 2022-06-07 MED ORDER — SODIUM CHLORIDE 0.9% FLUSH: 3.0000 mL | INTRAVENOUS | Status: DC | PRN

## 2022-06-07 MED ORDER — ONDANSETRON HCL 4 MG/2ML IJ SOLN
4.0000 mg | Freq: Four times a day (QID) | INTRAMUSCULAR | Status: DC | PRN
  Administered 2022-06-09: 4 mg via INTRAVENOUS
  Filled 2022-06-07: qty 2

## 2022-06-07 MED ORDER — SODIUM CHLORIDE 0.9% FLUSH
3.0000 mL | Freq: Two times a day (BID) | INTRAVENOUS | Status: DC
  Administered 2022-06-07 – 2022-06-09 (×5): 3 mL via INTRAVENOUS

## 2022-06-07 MED ORDER — ALBUTEROL SULFATE (2.5 MG/3ML) 0.083% IN NEBU
2.5000 mg | INHALATION_SOLUTION | Freq: Four times a day (QID) | RESPIRATORY_TRACT | Status: DC | PRN
  Administered 2022-06-08 – 2022-06-10 (×3): 2.5 mg via RESPIRATORY_TRACT
  Filled 2022-06-07 (×3): qty 3

## 2022-06-07 MED ORDER — FUROSEMIDE 10 MG/ML IJ SOLN: 40.0000 mg | Freq: Every day | INTRAMUSCULAR | Status: DC

## 2022-06-07 MED ORDER — SODIUM CHLORIDE 0.9 % IV SOLN
2.0000 g | Freq: Once | INTRAVENOUS | Status: AC
  Administered 2022-06-07: 2 g via INTRAVENOUS
  Filled 2022-06-07: qty 20

## 2022-06-07 MED ORDER — SODIUM CHLORIDE 0.9 % IV SOLN
2.0000 g | INTRAVENOUS | Status: DC
  Administered 2022-06-07 – 2022-06-09 (×3): 2 g via INTRAVENOUS
  Filled 2022-06-07 (×3): qty 20

## 2022-06-07 MED ORDER — SODIUM CHLORIDE 0.9 % IV SOLN
500.0000 mg | INTRAVENOUS | Status: DC
  Administered 2022-06-07: 500 mg via INTRAVENOUS
  Filled 2022-06-07: qty 5

## 2022-06-07 MED ORDER — VITAMIN D 25 MCG (1000 UNIT) PO TABS
5000.0000 [IU] | ORAL_TABLET | Freq: Every morning | ORAL | Status: DC
  Administered 2022-06-07 – 2022-06-10 (×3): 5000 [IU] via ORAL
  Filled 2022-06-07 (×4): qty 5

## 2022-06-07 MED ORDER — ATORVASTATIN CALCIUM 40 MG PO TABS
40.0000 mg | ORAL_TABLET | Freq: Every day | ORAL | Status: DC
  Administered 2022-06-07 – 2022-06-10 (×4): 40 mg via ORAL
  Filled 2022-06-07 (×4): qty 1

## 2022-06-07 MED ORDER — GUAIFENESIN ER 600 MG PO TB12
600.0000 mg | ORAL_TABLET | Freq: Every day | ORAL | Status: DC | PRN
  Administered 2022-06-07: 600 mg via ORAL
  Filled 2022-06-07: qty 1

## 2022-06-07 MED ORDER — OXYMETAZOLINE HCL 0.05 % NA SOLN: 1.0000 | Freq: Two times a day (BID) | NASAL | Status: DC | PRN

## 2022-06-07 MED ORDER — SODIUM CHLORIDE 0.9 % IV SOLN
500.0000 mg | Freq: Once | INTRAVENOUS | Status: AC
  Administered 2022-06-07: 500 mg via INTRAVENOUS
  Filled 2022-06-07: qty 5

## 2022-06-07 MED ORDER — METOPROLOL SUCCINATE ER 25 MG PO TB24
12.5000 mg | ORAL_TABLET | Freq: Every day | ORAL | Status: DC
  Administered 2022-06-07 – 2022-06-09 (×3): 12.5 mg via ORAL
  Filled 2022-06-07 (×3): qty 1

## 2022-06-07 MED ORDER — INSULIN ASPART 100 UNIT/ML IJ SOLN
0.0000 [IU] | Freq: Three times a day (TID) | INTRAMUSCULAR | Status: DC
  Administered 2022-06-07 – 2022-06-08 (×2): 8 [IU] via SUBCUTANEOUS
  Administered 2022-06-08 (×2): 3 [IU] via SUBCUTANEOUS
  Administered 2022-06-09: 5 [IU] via SUBCUTANEOUS
  Administered 2022-06-09 – 2022-06-10 (×2): 3 [IU] via SUBCUTANEOUS

## 2022-06-07 MED ORDER — POTASSIUM CHLORIDE CRYS ER 20 MEQ PO TBCR
40.0000 meq | EXTENDED_RELEASE_TABLET | Freq: Once | ORAL | Status: AC
  Administered 2022-06-07: 40 meq via ORAL
  Filled 2022-06-07: qty 2

## 2022-06-07 MED ORDER — INSULIN GLARGINE-YFGN 100 UNIT/ML ~~LOC~~ SOLN
10.0000 [IU] | Freq: Every day | SUBCUTANEOUS | Status: DC
  Administered 2022-06-07: 10 [IU] via SUBCUTANEOUS
  Filled 2022-06-07 (×2): qty 0.1

## 2022-06-07 MED ORDER — FUROSEMIDE 10 MG/ML IJ SOLN
40.0000 mg | Freq: Every day | INTRAMUSCULAR | Status: DC
  Administered 2022-06-07 – 2022-06-08 (×2): 40 mg via INTRAVENOUS
  Filled 2022-06-07 (×2): qty 4

## 2022-06-07 MED ORDER — LACTATED RINGERS IV SOLN: INTRAVENOUS | Status: DC

## 2022-06-07 MED ORDER — SPIRONOLACTONE 12.5 MG HALF TABLET
12.5000 mg | ORAL_TABLET | Freq: Every day | ORAL | Status: DC
  Administered 2022-06-07 – 2022-06-10 (×4): 12.5 mg via ORAL
  Filled 2022-06-07 (×4): qty 1

## 2022-06-07 MED ORDER — ALPRAZOLAM 0.25 MG PO TABS
0.2500 mg | ORAL_TABLET | Freq: Every evening | ORAL | Status: DC | PRN
  Administered 2022-06-07 – 2022-06-08 (×2): 0.25 mg via ORAL
  Filled 2022-06-07 (×2): qty 1

## 2022-06-07 NOTE — Assessment & Plan Note (Signed)
80 keytones in urine, AG 14.  Suspect very mild DKA at this point in setting of jardiance use + acute illness. Looks like he had mild DKA with BHB of 4 back in March this year. Patient still NOT on insulin at baseline though I question if he needs to be if BHB again comes back elevated today (2 DKA events in a year now). 1. DKA pathway with no IVF bolus 2. Insulin gtt per pathway 3. BMP Q4H 4. IVF: D5LR at just 50 cc/hr (want to keep patient net negative given decompensated CHF). 5. BHB pending

## 2022-06-07 NOTE — Progress Notes (Signed)
Inpatient Diabetes Program Recommendations  AACE/ADA: New Consensus Statement on Inpatient Glycemic Control (2015)  Target Ranges:  Prepandial:   less than 140 mg/dL      Peak postprandial:   less than 180 mg/dL (1-2 hours)      Critically ill patients:  140 - 180 mg/dL   Lab Results  Component Value Date   GLUCAP 114 (H) 06/07/2022   HGBA1C 6.6 (H) 06/07/2022    Review of Glycemic Control  Latest Reference Range & Units 06/07/22 03:50 06/07/22 05:20 06/07/22 06:39  Glucose-Capillary 70 - 99 mg/dL 121 (H) 114 (H) 114 (H)   Diabetes history: DM  Outpatient Diabetes medications:  Jardiance 10 mg daily, Prandin 2 mg tid Current orders for Inpatient glycemic control:  Semglee 10 units daily  Inpatient Diabetes Program Recommendations:    Note acidosis.  Patient transitioned off insulin drip.  A1C=6.6%.  Patients appetite has been reduced and patient is on Jardiance.  Do not recommend restarting Jardiance until appetite/intake improves.  Consider adding Novolog 0-6 units tid with meals and HS and check blood sugars tid and HS.    Thanks,  Adah Perl, RN, BC-ADM Inpatient Diabetes Coordinator Pager [phone]  (8a-5p)

## 2022-06-07 NOTE — H&P (Addendum)
History and Physical    Patient: Ricardo Zuniga MWN:[id number] DOB: 1950-10-07 DOA: 06/06/2022 DOS: the patient was seen and examined on 06/07/2022 PCP: Glenda Chroman, MD  Patient coming from: Home  Chief Complaint:  Chief Complaint  Patient presents with   Shortness of Breath   HPI: Ricardo Zuniga is a 71 y.o. male with medical history significant of HFrEF due to NICM (had endocarditis with chemo that wife thinks resulted in CHF, vs direct chemo toxicity from the ATRA that I presume they had him on or maybe both), APML in remission, DM2, HTN.  Pt with SOB for the past month.  Abruptly worsening in past couple of days with fever at home.  No productive cough.  Has mild BLE Edema, no h/o DVT nor PE.  Chronic baseline Tachycardia per patient.   Review of Systems: As mentioned in the history of present illness. All other systems reviewed and are negative. Past Medical History:  Diagnosis Date   Anal fissure    APML (acute promyelocytic leukemia) in remission ([place])    Completed treatment 04/2013   Coronary artery disease    Nonobstructive at cardiac catheterization 09/2018   Difficult intubation    w/shoulder surgery at surgical center between 2005-2008   Gastroesophageal reflux disease    History of blood transfusion 2014   History of kidney stones    passed stones   Hypercholesteremia    Neuropathy    bilateral feet   Nonischemic cardiomyopathy ([place])    a. EF 20% in 2015 - thought to possibly be viral induced or chemotherapy induced from treatments in 2014 b. at 35-40% by echo in 05/2016    Peptic ulcer disease    Pneumonia    Yrs ago   Type 2 diabetes mellitus ([place])    Ureteral colic    Vertigo    tx with valium prn   Wears glasses    Wears hearing aid in both ears    Past Surgical History:  Procedure Laterality Date   ANAL FISSURE REPAIR     APPENDECTOMY     BIOPSY  08/17/2020   Procedure: BIOPSY;  Surgeon: Harvel Quale, MD;  [facility];  Service: Gastroenterology;;   Wilmon Pali RELEASE Left 10/16/2017   Procedure: LEFT CARPAL TUNNEL RELEASE;  Surgeon: Earlie Server, MD;  Location: [place];  Service: Orthopedics;  Laterality: Left;   CARPAL TUNNEL RELEASE Right 11/25/2019   Procedure: CARPAL TUNNEL RELEASE;  Surgeon: Earlie Server, MD;  Location: [facility];  Service: Orthopedics;  Laterality: Right;   CATARACT EXTRACTION, BILATERAL     CERVICAL DISC ARTHROPLASTY N/A 01/05/2018   Procedure: Artificial Cervical Disc ReplacementCervical Three-Four, Cervical Four-Five;  Surgeon: Kristeen Miss, MD;  Location: [facility];  Service: Neurosurgery;  Laterality: N/A;   CERVICAL FUSION     CHOLECYSTECTOMY     COLONOSCOPY N/A 06/27/2015   Procedure: COLONOSCOPY;  Surgeon: Rogene Houston, MD;  [facility];  Service: Endoscopy;  Laterality: N/A;  730   COLONOSCOPY WITH PROPOFOL N/A 08/17/2020   Procedure: COLONOSCOPY WITH PROPOFOL;  Surgeon: Harvel Quale, MD;  [facility];  Service: Gastroenterology;  Laterality: N/A;  11:15   COLONOSCOPY WITH PROPOFOL N/A 10/23/2021   Procedure: COLONOSCOPY WITH PROPOFOL;  Surgeon: Harvel Quale, MD;  [facility];  Service: Gastroenterology;  Laterality: N/A;  9:45am   ESOPHAGOGASTRODUODENOSCOPY (EGD) WITH PROPOFOL N/A 08/17/2020   Procedure: ESOPHAGOGASTRODUODENOSCOPY (EGD) WITH PROPOFOL;  Surgeon: Harvel Quale,  MD;  [facility];  Service: Gastroenterology;  Laterality: N/A;   KNEE SURGERY     x3   LEFT AND RIGHT HEART CATHETERIZATION WITH CORONARY ANGIOGRAM N/A 12/12/2013   Procedure: LEFT AND RIGHT HEART CATHETERIZATION WITH CORONARY ANGIOGRAM;  Surgeon: [name], MD;  [facility];  Service: Cardiovascular;  Laterality: N/A;   LEFT HEART CATH AND CORONARY ANGIOGRAPHY N/A 09/17/2018   Procedure: LEFT HEART CATH AND CORONARY ANGIOGRAPHY;  Surgeon: Wellington Hampshire, MD;  Location: [facility] CV [facility];  Service:  Cardiovascular;  Laterality: N/A;   LUMBAR FUSION     LUMBAR LAMINECTOMY/DECOMPRESSION MICRODISCECTOMY Bilateral 01/02/2022   Procedure: Bilateral Lumbar Three-Four/Lumbar Four-Five Sublaminar decompression;  Surgeon: Kristeen Miss, MD;  Location: [place];  Service: Neurosurgery;  Laterality: Bilateral;  3C/RM 21 To Follow Dr Annette Stable   POLYPECTOMY  08/17/2020   Procedure: POLYPECTOMY;  Surgeon: Harvel Quale, MD;  [facility];  Service: Gastroenterology;;   POLYPECTOMY  10/23/2021   Procedure: POLYPECTOMY;  Surgeon: Harvel Quale, MD;  [facility];  Service: Gastroenterology;;   SHOULDER ACROMIOPLASTY Right 07/18/2015   Procedure: SHOULDER ACROMIOPLASTY;  Surgeon: Earlie Server, MD;  Location: [place];  Service: Orthopedics;  Laterality: Right;   SHOULDER ARTHROSCOPY Right 07/18/2015   Procedure: Right shoulder arthroscopy with debridement;  Surgeon: Earlie Server, MD;  Location: [place];  Service: Orthopedics;  Laterality: Right;   SHOULDER SURGERY     x3   TONSILLECTOMY     Social History:  reports that he quit smoking about 36 years ago. His smoking use included cigarettes. He started smoking about 54 years ago. He has a 34.00 pack-year smoking history. He has never used smokeless tobacco. He reports that he does not drink alcohol and does not use drugs.  Allergies  Allergen Reactions   Lisinopril Cough    Family History  Problem Relation Age of Onset   Colon cancer Other     Prior to Admission medications   Medication Sig Start Date End Date Taking? Authorizing Provider  furosemide (LASIX) 20 MG tablet Take 40 mg by mouth in the morning, and 20 mg by mouth in the evening, for 7 days 06/06/22  Yes Trifan, Carola Rhine, MD  potassium chloride (KLOR-CON) 10 MEQ tablet Take 1 tablet (10 mEq total) by mouth daily for 30 doses. 06/06/22 07/06/22 Yes Trifan, Carola Rhine, MD  acetaminophen (TYLENOL) 650 MG CR tablet  Take 650-1,300 mg by mouth every 8 (eight) hours as needed for pain.    [provider]  albuterol (VENTOLIN HFA) 108 (90 Base) MCG/ACT inhaler Inhale 2 puffs into the lungs every 6 (six) hours as needed for wheezing or shortness of breath. 05/23/22   Loel Dubonnet, NP  ALPRAZolam Duanne Moron) 0.25 MG tablet Take 0.25 mg by mouth at bedtime as needed. 05/13/22   [provider]  aspirin EC 81 MG tablet Take 81 mg by mouth in the morning.    [provider]  atorvastatin (LIPITOR) 40 MG tablet Take 1 tablet (40 mg total) by mouth daily. 03/21/22   Satira Sark, MD  Cholecalciferol (VITAMIN D3) 125 MCG (5000 UT) TABS Take 1 tablet by mouth in the morning.    [provider]  diazepam (VALIUM) 5 MG tablet Take 5 mg by mouth daily as needed (vertigo). 10/17/21   [provider]  ENTRESTO 24-26 MG Take 1 tablet by mouth twice daily 02/24/22   [name]  G, MD  furosemide (LASIX) 20 MG tablet Take 1 tablet (20 mg total) by mouth daily. 02/28/22   Satira Sark, MD  guaiFENesin (MUCINEX) 600 MG 12 hr tablet Take 600 mg by mouth daily as needed (congestion.).    [provider]  JARDIANCE 10 MG TABS tablet Take 10 mg by mouth in the morning. 12/18/21   [provider]  methocarbamol (ROBAXIN) 500 MG tablet Take 1 tablet (500 mg total) by mouth every 6 (six) hours as needed for muscle spasms. 01/03/22   Kristeen Miss, MD  metoprolol succinate (TOPROL-XL) 25 MG 24 hr tablet Take 1 tablet (25 mg total) by mouth daily. Patient taking differently: Take 12.5 mg by mouth in the morning. 03/18/21   Verta Ellen., NP  naproxen sodium (ALEVE) 220 MG tablet Take 220-440 mg by mouth daily as needed (pain.).    [provider]  omeprazole (PRILOSEC) 20 MG capsule Take 20 mg by mouth daily before breakfast.    [provider]  ondansetron (ZOFRAN) 4 MG tablet Take 1 tablet (4 mg total) by mouth every 6 (six) hours as needed for  nausea. 11/03/21   Raiford Noble Latif, DO  repaglinide (PRANDIN) 2 MG tablet Take 2 mg by mouth 3 (three) times daily before meals.  07/06/17   [provider]  spironolactone (ALDACTONE) 25 MG tablet Take 1/2 (one-half) tablet by mouth once daily 03/19/22   Satira Sark, MD  zinc gluconate 50 MG tablet Take 50 mg by mouth in the morning.    [provider]    Physical Exam: Vitals:   06/06/22 1630 06/06/22 2127 06/06/22 2315  BP: 123/66 115/67 114/73  Pulse: (!) 119 (!) 104 (!) 102  Resp: '16 18 20  '$ Temp: 98.2 F (36.8 C) 99.8 F (37.7 C)   TempSrc: Oral    SpO2: 96% 94% 94%   Constitutional: NAD, calm, comfortable Eyes: PERRL, lids and conjunctivae normal ENMT: Mucous membranes are moist. Posterior pharynx clear of any exudate or lesions.Normal dentition.  Neck: normal, supple, no masses, no thyromegaly Respiratory: B crackles, no accessory muscle use nor respiratory distress Cardiovascular: Mild tachycardia present, Trace BLE pitting edema Abdomen: no tenderness, no masses palpated. No hepatosplenomegaly. Bowel sounds positive.  Musculoskeletal: no clubbing / cyanosis. No joint deformity upper and lower extremities. Good ROM, no contractures. Normal muscle tone.  Skin: no rashes, lesions, ulcers. No induration Neurologic: CN 2-12 grossly intact. Sensation intact, DTR normal. Strength 5/5 in all 4.  Psychiatric: Normal judgment and insight. Alert and oriented x 3. Normal mood.   Data Reviewed:     CTA chest: IMPRESSION: 1. No evidence of pulmonary embolism. 2. Mild to moderate severity posterior right upper lobe infiltrate. 3. Mild left upper lobe and bibasilar atelectasis and/or early infiltrate. 4. Small right pleural effusion. 5. Mild cardiomegaly and mild coronary artery calcification. 6. Aortic atherosclerosis.  2d echo from July 2023 at [id number]     1. Left ventricular ejection fraction, by estimation, is 45%. The left  ventricle has  mildly decreased function. The left ventricle demonstrates  global hypokinesis. Left ventricular diastolic parameters are  indeterminate. Elevated left atrial pressure.  The average left ventricular global longitudinal strain is -11.4 %. The  global longitudinal strain is abnormal.   2. Right ventricular systolic function is normal. The right ventricular  size is normal. Tricuspid regurgitation signal is inadequate for assessing  PA pressure.   3. The mitral valve is abnormal. Trivial mitral valve regurgitation. No  evidence of mitral stenosis.   4. The aortic valve is tricuspid. There is mild calcification of the  aortic valve. There is mild thickening of the aortic valve. Aortic valve  regurgitation is moderate. No aortic stenosis is present.   5. Aortic dilatation noted. There is mild dilatation of the ascending  aorta, measuring 37 mm.   6. The inferior vena cava is normal in size with greater than 50%  respiratory variability, suggesting right atrial pressure of 3 mmHg.    Assessment and Plan: * CAP (community acquired pneumonia) Patient with acute PNA superimposed on more chronically worsening (over past 1 month) CHF. PNA pathway Rocephin + azithro COVID and flu were negative yesterday RVP S.pneumo urinary antigen  Acute on chronic HFrEF (heart failure with reduced ejection fraction) (HCC) Pt with decompensated CHF over past month or so. CHF pathway Lasix '40mg'$  IV daily (first dose in ED) Strict intake and output Tele monitor Will hold off on on repeating 2d echo for the moment given he just had this 3 months ago at OSH (see results section above). Cont home entresto, metoprolol, and aldactone Holding jardiance (see DKA discussion below).  Diabetic ketoacidosis without coma associated with type 2 diabetes mellitus ([place]) 80 keytones in urine, AG 14.  Suspect very mild DKA at this point in setting of jardiance use + acute illness. Looks like he had mild DKA with BHB of 4  back in March this year. Patient still NOT on insulin at baseline though I question if he needs to be if BHB again comes back elevated today (2 DKA events in a year now). DKA pathway with no IVF bolus Insulin gtt per pathway BMP Q4H IVF: D5LR at just 50 cc/hr (want to keep patient net negative given decompensated CHF). BHB pending  Essential hypertension Continue entresto, aldactone, metoprolol  Hyperlipidemia Continue statin.      Advance Care Planning:   Code Status: Full Code  Consults: None  Family Communication: Wife at bedside  Severity of Illness: The appropriate patient status for this patient is OBSERVATION. Observation status is judged to be reasonable and necessary in order to provide the required intensity of service to ensure the patient's safety. The patient's presenting symptoms, physical exam findings, and initial radiographic and laboratory data in the context of their medical condition is felt to place them at decreased risk for further clinical deterioration. Furthermore, it is anticipated that the patient will be medically stable for discharge from the hospital within 2 midnights of admission.   Author: Etta Quill., DO 06/07/2022 1:03 AM  For on call review [email or web address].

## 2022-06-07 NOTE — Assessment & Plan Note (Signed)
Pt with decompensated CHF over past month or so. 1. CHF pathway 2. Lasix '40mg'$  IV daily (first dose in ED) 3. Strict intake and output 4. Tele monitor 5. Will hold off on on repeating 2d echo for the moment given he just had this 3 months ago at OSH (see results section above). 6. Cont home entresto, metoprolol, and aldactone 7. Holding jardiance (see DKA discussion below).

## 2022-06-07 NOTE — Progress Notes (Addendum)
PROGRESS NOTE    KHY PITRE  YWV:[id number] DOB: 01/29/1951 DOA: 06/06/2022 PCP: Glenda Chroman, MD    Ricardo Zuniga is a 71 y.o. male with medical history significant of Chronic systolic CHF EF 85%,, APML in remission, DM2, HTN. Pt with SOB for the past month Abruptly worsening in past couple of days with subjective fever at home.  Mild cough, has mild BLE Edema, no h/o DVT nor PE. Chronic baseline Tachycardia per patient. In ED, VSS, labs w/ mild leukocytosis, BNP 1624, C Chest w/ Multifocal PNA   Subjective:   Assessment and Plan:  CAP (community acquired pneumonia) -Flu and COVID PCR were negative, follow-up respiratory virus panel -Continue Rocephin and azithromycin  Acute on chronic systolic CHF -Last echo in July with EF of 45% -With progressive dyspnea on exertion over the last few months will repeat echo to ensure no changes from baseline -Continue IV Lasix today -Continue Aldactone, Entresto, beta-blocker -Resume Jardiance tomorrow,  History of nonobstructive CAD -Continue outpatient follow-up for coronary CTA, due to be done soon as outpatient -No ACS at this time -Continue aspirin, metoprolol and statin  Type 2 diabetes mellitus -Mild ketosis, do not suspect DKA, likely with acute illness and poor oral intake -Semglee today, discontinue insulin drip and dextrose -HbA1c is 6.6  Essential hypertension Continue entresto, aldactone, metoprolol  Hyperlipidemia Continue statin.   DVT prophylaxis: Lovenox Code Status: Full Code Family Communication: Patient and wife at bedside Disposition Plan: Home likely 1 to 2 days  Consultants:    Procedures:   Antimicrobials:    Objective: Vitals:   06/06/22 1630 06/06/22 2127 06/06/22 2315 06/07/22 0405  BP: 123/66 115/67 114/73 101/70  Pulse: (!) 119 (!) 104 (!) 102 94  Resp: '16 18 20 16  '$ Temp: 98.2 F (36.8 C) 99.8 F (37.7 C)    TempSrc: Oral     SpO2: 96% 94% 94% 95%    Intake/Output  Summary (Last 24 hours) at 06/07/2022 0618 Last data filed at 06/07/2022 0341 Gross per 24 hour  Intake 350 ml  Output 2100 ml  Net -1750 ml   There were no vitals filed for this visit.  Examination:  General exam: Appears calm and comfortable  Respiratory system: Clear to auscultation Cardiovascular system: S1 & S2 heard, RRR.  Abd: nondistended, soft and nontender.Normal bowel sounds heard. Central nervous system: Alert and oriented. No focal neurological deficits. Extremities: no edema Skin: No rashes Psychiatry:  Mood & affect appropriate.     Data Reviewed:   CBC: Recent Labs  Lab 06/06/22 1644  WBC 12.0*  HGB 14.9  HCT 44.9  MCV 95.1  PLT 462   Basic Metabolic Panel: Recent Labs  Lab 06/06/22 1644 06/07/22 0514  NA 138 136  K 4.3 3.7  CL 103 100  CO2 21* 23  GLUCOSE 206* 116*  BUN 16 14  CREATININE 1.17 1.03  CALCIUM 9.5 8.8*   GFR: CrCl cannot be calculated (Unknown ideal weight.). Liver Function Tests: No results for input(s): "AST", "ALT", "ALKPHOS", "BILITOT", "PROT", "ALBUMIN" in the last 168 hours. No results for input(s): "LIPASE", "AMYLASE" in the last 168 hours. No results for input(s): "AMMONIA" in the last 168 hours. Coagulation Profile: No results for input(s): "INR", "PROTIME" in the last 168 hours. Cardiac Enzymes: No results for input(s): "CKTOTAL", "CKMB", "CKMBINDEX", "TROPONINI" in the last 168 hours. BNP (last 3 results) No results for input(s): "PROBNP" in the last 8760 hours. HbA1C: Recent Labs    06/07/22 0514  HGBA1C  6.6*   CBG: Recent Labs  Lab 06/07/22 0233 06/07/22 0350 06/07/22 0520  GLUCAP 176* 121* 114*   Lipid Profile: No results for input(s): "CHOL", "HDL", "LDLCALC", "TRIG", "CHOLHDL", "LDLDIRECT" in the last 72 hours. Thyroid Function Tests: No results for input(s): "TSH", "T4TOTAL", "FREET4", "T3FREE", "THYROIDAB" in the last 72 hours. Anemia Panel: No results for input(s): "VITAMINB12", "FOLATE",  "FERRITIN", "TIBC", "IRON", "RETICCTPCT" in the last 72 hours. Urine analysis:    Component Value Date/Time   COLORURINE YELLOW 06/06/2022 [place] 06/06/2022 2254   LABSPEC 1.031 (H) 06/06/2022 2254   PHURINE 5.0 06/06/2022 2254   GLUCOSEU >=500 (A) 06/06/2022 2254   HGBUR SMALL (A) 06/06/2022 2254   BILIRUBINUR NEGATIVE 06/06/2022 2254   KETONESUR 80 (A) 06/06/2022 2254   PROTEINUR 30 (A) 06/06/2022 2254   NITRITE NEGATIVE 06/06/2022 2254   LEUKOCYTESUR NEGATIVE 06/06/2022 2254   Sepsis Labs: '@LABRCNTIP'$ (procalcitonin:4,lacticidven:4)  )No results found for this or any previous visit (from the past 240 hour(s)).   Radiology Studies: CT Angio Chest PE W and/or Wo Contrast  Result Date: 06/06/2022 CLINICAL DATA:  Suspected pulmonary embolism. EXAM: CT ANGIOGRAPHY CHEST WITH CONTRAST TECHNIQUE: Multidetector CT imaging of the chest was performed using the standard protocol during bolus administration of intravenous contrast. Multiplanar CT image reconstructions and MIPs were obtained to evaluate the vascular anatomy. RADIATION DOSE REDUCTION: This exam was performed according to the departmental dose-optimization program which includes automated exposure control, adjustment of the mA and/or kV according to patient size and/or use of iterative reconstruction technique. CONTRAST:  58m OMNIPAQUE IOHEXOL 350 MG/ML SOLN COMPARISON:  None Available. FINDINGS: Cardiovascular: There is mild calcification of the aortic arch, without evidence of aortic aneurysm. Satisfactory opacification of the pulmonary arteries to the segmental level. No evidence of pulmonary embolism. There is mild cardiomegaly and mild coronary artery calcification. No pericardial effusion. Mediastinum/Nodes: There is mild AP window, pretracheal and subcarinal lymphadenopathy. Thyroid gland, trachea, and esophagus demonstrate no significant findings. Lungs/Pleura: Mild to moderate severity patchy posterior right  upper lobe infiltrate is seen. Mild areas of atelectasis and/or early infiltrate are also seen within the anterior aspect of the left upper lobe and posterior aspects of the bilateral lung bases. There is a small right pleural effusion. No pneumothorax is identified. Upper Abdomen: No acute abnormality. Musculoskeletal: No chest wall abnormality. No acute or significant osseous findings. Review of the MIP images confirms the above findings. IMPRESSION: 1. No evidence of pulmonary embolism. 2. Mild to moderate severity posterior right upper lobe infiltrate. 3. Mild left upper lobe and bibasilar atelectasis and/or early infiltrate. 4. Small right pleural effusion. 5. Mild cardiomegaly and mild coronary artery calcification. 6. Aortic atherosclerosis. Aortic Atherosclerosis (ICD10-I70.0). Electronically Signed   By: TVirgina NorfolkM.D.   On: 06/06/2022 23:43   DG Chest 2 View  Result Date: 06/06/2022 CLINICAL DATA:  Shortness of breath EXAM: CHEST - 2 VIEW COMPARISON:  Chest x-ray 05/20/2022 FINDINGS: Heart is mildly enlarged. There is central pulmonary vascular congestion. The lungs are clear. There is no pleural effusion or pneumothorax. There surgical clips in the upper abdomen. No acute fractures are seen. IMPRESSION: Mild cardiomegaly with central pulmonary vascular congestion. Electronically Signed   By: ARonney AstersM.D.   On: 06/06/2022 17:12     Scheduled Meds:  aspirin EC  81 mg Oral q AM   atorvastatin  40 mg Oral Daily   cholecalciferol  5,000 Units Oral q AM   enoxaparin (LOVENOX) injection  40 mg Subcutaneous  Q24H   [START ON 06/08/2022] furosemide  40 mg Intravenous Daily   metoprolol succinate  12.5 mg Oral Daily   pantoprazole  40 mg Oral Daily   sacubitril-valsartan  1 tablet Oral BID   sodium chloride flush  3 mL Intravenous Q12H   spironolactone  12.5 mg Oral Daily   Continuous Infusions:  sodium chloride     [START ON 06/08/2022] azithromycin     cefTRIAXone (ROCEPHIN)   IV     dextrose 5% lactated ringers 50 mL/hr at 06/07/22 0241   insulin 1.1 Units/hr (06/07/22 0532)   lactated ringers Stopped (06/07/22 0242)     LOS: 0 days    Time spent: 19mn    PDomenic Polite MD [facility]   06/07/2022, 6:18 AM

## 2022-06-07 NOTE — Assessment & Plan Note (Signed)
Continue statin. 

## 2022-06-07 NOTE — ED Notes (Signed)
Patient resting talking with family at bedside. No s/s of any distress. NO verbal c/o pain or discomfort. Will continue to monitor

## 2022-06-07 NOTE — ED Notes (Signed)
Patient declined to be placed in a hospital gown, pt states that he is more comfortable in his clothes.

## 2022-06-07 NOTE — Progress Notes (Addendum)
Patient arrived to [facility] bed 22 from emergency department. Patient alert and oriented x4. Oriented patient to nursing unit and call bell system. No acute distress noted at present time.Patient call bell and personal items within reach.

## 2022-06-07 NOTE — Assessment & Plan Note (Signed)
Continue entresto, aldactone, metoprolol

## 2022-06-07 NOTE — ED Notes (Signed)
ED TO INPATIENT HANDOFF REPORT  ED Nurse Name and Phone #: Philip Aspen Name/Age/Gender Ricardo Zuniga 71 y.o. male Room/Bed: H012C/H012C  Code Status   Code Status: Full Code  Home/SNF/Other Home Patient oriented to: self, place, time, and situation Is this baseline? Yes   Triage Complete: Triage complete  Chief Complaint Acute on chronic HFrEF (heart failure with reduced ejection fraction) ([place]) [I50.23]  Triage Note Patient here with complaint of intermittent shortness of breath that started several months ago. Patient is alert, oriented, and speaking in complete sentences.   Allergies Allergies  Allergen Reactions   Flonase [Fluticasone]    Lisinopril Cough    Level of Care/Admitting Diagnosis ED Disposition     ED Disposition  Admit   Condition  --   Comment  Hospital Area: [place] [[id number]]  Level of Care: Telemetry Cardiac [[id number]]  May admit patient to [facility] or [facility] if equivalent level of care is available:: No  Covid Evaluation: Confirmed COVID Negative  Diagnosis: Acute on chronic HFrEF (heart failure with reduced ejection fraction) [facility]) [[id number]]  Admitting Physician: Etta Quill [phone]  Attending Physician: Domenic Polite [[id number]]  Certification:: I certify this patient will need inpatient services for at least 2 midnights  Estimated Length of Stay: 2          B Medical/Surgery History Past Medical History:  Diagnosis Date   Anal fissure    APML (acute promyelocytic leukemia) in remission ([place])    Completed treatment 04/2013   Coronary artery disease    Nonobstructive at cardiac catheterization 09/2018   Difficult intubation    w/shoulder surgery at surgical center between 2005-2008   Gastroesophageal reflux disease    History of blood transfusion 2014   History of kidney stones    passed stones   Hypercholesteremia    Neuropathy    bilateral feet   Nonischemic cardiomyopathy ([place])    a. EF 20% in  2015 - thought to possibly be viral induced or chemotherapy induced from treatments in 2014 b. at 35-40% by echo in 05/2016    Peptic ulcer disease    Pneumonia    Yrs ago   Type 2 diabetes mellitus ([place])    Ureteral colic    Vertigo    tx with valium prn   Wears glasses    Wears hearing aid in both ears    Past Surgical History:  Procedure Laterality Date   ANAL FISSURE REPAIR     APPENDECTOMY     BIOPSY  08/17/2020   Procedure: BIOPSY;  Surgeon: Harvel Quale, MD;  [facility];  Service: Gastroenterology;;   Wilmon Pali RELEASE Left 10/16/2017   Procedure: LEFT CARPAL TUNNEL RELEASE;  Surgeon: Earlie Server, MD;  Location: [place];  Service: Orthopedics;  Laterality: Left;   CARPAL TUNNEL RELEASE Right 11/25/2019   Procedure: CARPAL TUNNEL RELEASE;  Surgeon: Earlie Server, MD;  Location: [facility];  Service: Orthopedics;  Laterality: Right;   CATARACT EXTRACTION, BILATERAL     CERVICAL DISC ARTHROPLASTY N/A 01/05/2018   Procedure: Artificial Cervical Disc ReplacementCervical Three-Four, Cervical Four-Five;  Surgeon: Kristeen Miss, MD;  Location: [place];  Service: Neurosurgery;  Laterality: N/A;   CERVICAL FUSION     CHOLECYSTECTOMY     COLONOSCOPY N/A 06/27/2015   Procedure: COLONOSCOPY;  Surgeon: Rogene Houston, MD;  [facility];  Service: Endoscopy;  Laterality: N/A;  730   COLONOSCOPY WITH PROPOFOL N/A 08/17/2020  Procedure: COLONOSCOPY WITH PROPOFOL;  Surgeon: Harvel Quale, MD;  [facility];  Service: Gastroenterology;  Laterality: N/A;  11:15   COLONOSCOPY WITH PROPOFOL N/A 10/23/2021   Procedure: COLONOSCOPY WITH PROPOFOL;  Surgeon: Harvel Quale, MD;  [facility];  Service: Gastroenterology;  Laterality: N/A;  9:45am   ESOPHAGOGASTRODUODENOSCOPY (EGD) WITH PROPOFOL N/A 08/17/2020   Procedure: ESOPHAGOGASTRODUODENOSCOPY (EGD) WITH PROPOFOL;  Surgeon: Harvel Quale, MD;  [facility];  Service: Gastroenterology;  Laterality: N/A;   KNEE SURGERY     x3   LEFT AND RIGHT HEART CATHETERIZATION WITH CORONARY ANGIOGRAM N/A 12/12/2013   Procedure: LEFT AND RIGHT HEART CATHETERIZATION WITH CORONARY ANGIOGRAM;  Surgeon: [name], MD;  Location: [facility] CATH LAB;  Service: Cardiovascular;  Laterality: N/A;   LEFT HEART CATH AND CORONARY ANGIOGRAPHY N/A 09/17/2018   Procedure: LEFT HEART CATH AND CORONARY ANGIOGRAPHY;  Surgeon: Wellington [place], MD;  Location: [facility];  Service: Cardiovascular;  Laterality: N/A;   LUMBAR FUSION     LUMBAR LAMINECTOMY/DECOMPRESSION MICRODISCECTOMY Bilateral 01/02/2022   Procedure: Bilateral Lumbar Three-Four/Lumbar Four-Five Sublaminar decompression;  Surgeon: Kristeen Miss, MD;  Location: [place];  Service: Neurosurgery;  Laterality: Bilateral;  3C/RM 21 To Follow Dr Annette Stable   POLYPECTOMY  08/17/2020   Procedure: POLYPECTOMY;  Surgeon: Harvel Quale, MD;  [facility];  Service: Gastroenterology;;   POLYPECTOMY  10/23/2021   Procedure: POLYPECTOMY;  Surgeon: Harvel Quale, MD;  [facility];  Service: Gastroenterology;;   SHOULDER ACROMIOPLASTY Right 07/18/2015   Procedure: SHOULDER ACROMIOPLASTY;  Surgeon: Earlie Server, MD;  Location: [place];  Service: Orthopedics;  Laterality: Right;   SHOULDER ARTHROSCOPY Right 07/18/2015   Procedure: Right shoulder arthroscopy with debridement;  Surgeon: Earlie Server, MD;  Location: [place];  Service: Orthopedics;  Laterality: Right;   SHOULDER SURGERY     x3   TONSILLECTOMY       A IV Location/Drains/Wounds Patient Lines/Drains/Airways Status     Active Line/Drains/Airways     Name Placement date Placement time Site Days   Peripheral IV 06/06/22 20 G Left Antecubital 06/06/22  2304  Antecubital  1   Peripheral IV 06/07/22 20 G Anterior;Right Forearm 06/07/22  0240  Forearm  less than 1   Incision (Closed)  07/18/15 Shoulder Right 07/18/15  1359  -- 2516   Incision (Closed) 10/16/17 Arm Other (Comment) 10/16/17  1251  -- 1695   Incision (Closed) 01/05/18 Neck 01/05/18  1724  -- 1614   Incision (Closed) 11/25/19 Hand Right 11/25/19  0817  -- 925   Incision (Closed) 01/02/22 Back Other (Comment) 01/02/22  1331  -- 156            Intake/Output Last 24 hours  Intake/Output Summary (Last 24 hours) at 06/07/2022 1914 Last data filed at 06/07/2022 0855 Gross per 24 hour  Intake 360.62 ml  Output 2400 ml  Net -2039.38 ml    Labs/Imaging Results for orders placed or performed during the hospital encounter of 06/06/22 (from the past 48 hour(s))  Basic metabolic panel     Status: Abnormal   Collection Time: 06/06/22  4:44 PM  Result Value Ref Range   Sodium 138 135 - 145 mmol/L   Potassium 4.3 3.5 - 5.1 mmol/L   Chloride 103 98 - 111 mmol/L   CO2 21 (L) 22 - 32 mmol/L   Glucose, Bld 206 (H) 70 - 99 mg/dL  Comment: Glucose reference range applies only to samples taken after fasting for at least 8 hours.   BUN 16 8 - 23 mg/dL   Creatinine, Ser 1.17 0.61 - 1.24 mg/dL   Calcium 9.5 8.9 - 10.3 mg/dL   GFR, Estimated >60 >60 mL/min    Comment: (NOTE) Calculated using the CKD-EPI Creatinine Equation (2021)    Anion gap 14 5 - 15    Comment: Performed at [facility] [address], [place], [place] [address]  CBC     Status: Abnormal   Collection Time: 06/06/22  4:44 PM  Result Value Ref Range   WBC 12.0 (H) 4.0 - 10.5 K/uL   RBC 4.72 4.22 - 5.81 MIL/uL   Hemoglobin 14.9 13.0 - 17.0 g/dL   HCT 44.9 39.0 - 52.0 %   MCV 95.1 80.0 - 100.0 fL   MCH 31.6 26.0 - 34.0 pg   MCHC 33.2 30.0 - 36.0 g/dL   RDW 15.1 11.5 - 15.5 %   Platelets 243 150 - 400 K/uL   nRBC 0.0 0.0 - 0.2 %    Comment: Performed at [facility], [place] [address] [place], [place] [address]  Troponin I (High Sensitivity)     Status: Abnormal   Collection Time: 06/06/22  4:44 PM  Result Value Ref Range    Troponin I (High Sensitivity) 19 (H) <18 ng/L    Comment: (NOTE) Elevated high sensitivity troponin I (hsTnI) values and significant  changes across serial measurements may suggest ACS but many other  chronic and acute conditions are known to elevate hsTnI results.  Refer to the "Links" section for chest pain algorithms and additional  guidance. Performed at [facility], [place] [address] [place], [place] [address]   D-dimer, quantitative     Status: Abnormal   Collection Time: 06/06/22  4:44 PM  Result Value Ref Range   D-Dimer, Quant 0.69 (H) 0.00 - 0.50 ug/mL-FEU    Comment: (NOTE) At the manufacturer cut-off value of 0.5 g/mL FEU, this assay has a negative predictive value of 95-100%.This assay is intended for use in conjunction with a clinical pretest probability (PTP) assessment model to exclude pulmonary embolism (PE) and deep venous thrombosis (DVT) in outpatients suspected of PE or DVT. Results should be correlated with clinical presentation. Performed at [facility], [place] [address] [place], [place] [address]   Brain natriuretic peptide     Status: Abnormal   Collection Time: 06/06/22  4:49 PM  Result Value Ref Range   B Natriuretic Peptide 1,624.1 (H) 0.0 - 100.0 pg/mL    Comment: Performed at [facility] [address], [place], [place] [address]  Troponin I (High Sensitivity)     Status: Abnormal   Collection Time: 06/06/22  7:51 PM  Result Value Ref Range   Troponin I (High Sensitivity) 31 (H) <18 ng/L    Comment: (NOTE) Elevated high sensitivity troponin I (hsTnI) values and significant  changes across serial measurements may suggest ACS but many other  chronic and acute conditions are known to elevate hsTnI results.  Refer to the "Links" section for chest pain algorithms and additional  guidance. Performed at [facility], [place] [address], [place], [place] [address]   Urinalysis, Routine w reflex microscopic     Status: Abnormal    Collection Time: 06/06/22 10:54 PM  Result Value Ref Range   Color, Urine YELLOW YELLOW   APPearance CLEAR CLEAR   Specific  Gravity, Urine 1.031 (H) 1.005 - 1.030   pH 5.0 5.0 - 8.0   Glucose, UA >=500 (A) NEGATIVE mg/dL   Hgb urine dipstick SMALL (A) NEGATIVE   Bilirubin Urine NEGATIVE NEGATIVE   Ketones, ur 80 (A) NEGATIVE mg/dL   Protein, ur 30 (A) NEGATIVE mg/dL   Nitrite NEGATIVE NEGATIVE   Leukocytes,Ua NEGATIVE NEGATIVE   RBC / HPF 0-5 0 - 5 RBC/hpf   WBC, UA 0-5 0 - 5 WBC/hpf   Bacteria, UA NONE SEEN NONE SEEN   Mucus PRESENT     Comment: Performed at [facility] [address], [place], [place] [address]  CBG monitoring, ED     Status: Abnormal   Collection Time: 06/07/22  2:33 AM  Result Value Ref Range   Glucose-Capillary 176 (H) 70 - 99 mg/dL    Comment: Glucose reference range applies only to samples taken after fasting for at least 8 hours.  CBG monitoring, ED     Status: Abnormal   Collection Time: 06/07/22  3:50 AM  Result Value Ref Range   Glucose-Capillary 121 (H) 70 - 99 mg/dL    Comment: Glucose reference range applies only to samples taken after fasting for at least 8 hours.  Procalcitonin - Baseline     Status: None   Collection Time: 06/07/22  5:14 AM  Result Value Ref Range   Procalcitonin <0.10 ng/mL    Comment:        Interpretation: PCT (Procalcitonin) <= 0.5 ng/mL: Systemic infection (sepsis) is not likely. Local bacterial infection is possible. (NOTE)       Sepsis PCT Algorithm           Lower Respiratory Tract                                      Infection PCT Algorithm    ----------------------------     ----------------------------         PCT < 0.25 ng/mL                PCT < 0.10 ng/mL          Strongly encourage             Strongly discourage   discontinuation of antibiotics    initiation of antibiotics    ----------------------------     -----------------------------       PCT 0.25 - 0.50 ng/mL            PCT 0.10 - 0.25  ng/mL               OR       >80% decrease in PCT            Discourage initiation of                                            antibiotics      Encourage discontinuation           of antibiotics    ----------------------------     -----------------------------         PCT >= 0.50 ng/mL              PCT 0.26 - 0.50 ng/mL  AND        <80% decrease in PCT             Encourage initiation of                                             antibiotics       Encourage continuation           of antibiotics    ----------------------------     -----------------------------        PCT >= 0.50 ng/mL                  PCT > 0.50 ng/mL               AND         increase in PCT                  Strongly encourage                                      initiation of antibiotics    Strongly encourage escalation           of antibiotics                                     -----------------------------                                           PCT <= 0.25 ng/mL                                                 OR                                        > 80% decrease in PCT                                      Discontinue / Do not initiate                                             antibiotics  Performed at [facility], [place] [address], [place], [place] [address]   HIV Antibody (routine testing w rflx)     Status: None   Collection Time: 06/07/22  5:14 AM  Result Value Ref Range   HIV Screen 4th Generation wRfx Non Reactive Non Reactive    Comment: Performed at [facility], [place] [address], [place], [place] [address]  Basic metabolic panel     Status: Abnormal   Collection Time: 06/07/22  5:14 AM  Result Value Ref Range   Sodium 136 135 - 145 mmol/L  Potassium 3.7 3.5 - 5.1 mmol/L   Chloride 100 98 - 111 mmol/L   CO2 23 22 - 32 mmol/L   Glucose, Bld 116 (H) 70 - 99 mg/dL    Comment: Glucose reference range applies only to samples taken after fasting for at least 8 hours.    BUN 14 8 - 23 mg/dL   Creatinine, Ser 1.03 0.61 - 1.24 mg/dL   Calcium 8.8 (L) 8.9 - 10.3 mg/dL   GFR, Estimated >60 >60 mL/min    Comment: (NOTE) Calculated using the CKD-EPI Creatinine Equation (2021)    Anion gap 13 5 - 15    Comment: Performed at [facility] [address], [place], [place] [address]  Beta-hydroxybutyric acid     Status: Abnormal   Collection Time: 06/07/22  5:14 AM  Result Value Ref Range   Beta-Hydroxybutyric Acid 0.74 (H) 0.05 - 0.27 mmol/L    Comment: Performed at [facility] [address] [place], [place] [address]  Hemoglobin A1c     Status: Abnormal   Collection Time: 06/07/22  5:14 AM  Result Value Ref Range   Hgb A1c MFr Bld 6.6 (H) 4.8 - 5.6 %    Comment: (NOTE) Pre diabetes:          5.7%-6.4%  Diabetes:              >6.4%  Glycemic control for   <7.0% adults with diabetes    Mean Plasma Glucose 142.72 mg/dL    Comment: Performed at [facility] [address], [place], [place] [address]  CBG monitoring, ED     Status: Abnormal   Collection Time: 06/07/22  5:20 AM  Result Value Ref Range   Glucose-Capillary 114 (H) 70 - 99 mg/dL    Comment: Glucose reference range applies only to samples taken after fasting for at least 8 hours.  CBG monitoring, ED     Status: Abnormal   Collection Time: 06/07/22  6:39 AM  Result Value Ref Range   Glucose-Capillary 114 (H) 70 - 99 mg/dL    Comment: Glucose reference range applies only to samples taken after fasting for at least 8 hours.  Respiratory (~20 pathogens) panel by PCR     Status: None   Collection Time: 06/07/22  9:43 AM   Specimen: Nasopharyngeal Swab; Respiratory  Result Value Ref Range   Adenovirus NOT DETECTED NOT DETECTED   Coronavirus 229E NOT DETECTED NOT DETECTED    Comment: (NOTE) The Coronavirus on the Respiratory Panel, DOES NOT test for the novel  Coronavirus (2019 nCoV)    Coronavirus HKU1 NOT DETECTED NOT DETECTED   Coronavirus NL63 NOT DETECTED NOT  DETECTED   Coronavirus OC43 NOT DETECTED NOT DETECTED   Metapneumovirus NOT DETECTED NOT DETECTED   Rhinovirus / Enterovirus NOT DETECTED NOT DETECTED   Influenza A NOT DETECTED NOT DETECTED   Influenza B NOT DETECTED NOT DETECTED   Parainfluenza Virus 1 NOT DETECTED NOT DETECTED   Parainfluenza Virus 2 NOT DETECTED NOT DETECTED   Parainfluenza Virus 3 NOT DETECTED NOT DETECTED   Parainfluenza Virus 4 NOT DETECTED NOT DETECTED   Respiratory Syncytial Virus NOT DETECTED NOT DETECTED   Bordetella pertussis NOT DETECTED NOT DETECTED   Bordetella Parapertussis NOT DETECTED NOT DETECTED   Chlamydophila pneumoniae NOT DETECTED NOT DETECTED   Mycoplasma pneumoniae NOT DETECTED NOT DETECTED    Comment: Performed at [facility], [place] [address] [place], [place] [address]  Strep pneumoniae urinary  antigen     Status: None   Collection Time: 06/07/22 11:27 AM  Result Value Ref Range   Strep Pneumo Urinary Antigen NEGATIVE NEGATIVE    Comment:        Infection due to S. pneumoniae cannot be absolutely ruled out since the antigen present may be below the detection limit of the test. Performed at [facility], [address], [place], [place] [address]   CBG monitoring, ED     Status: Abnormal   Collection Time: 06/07/22  4:18 PM  Result Value Ref Range   Glucose-Capillary 254 (H) 70 - 99 mg/dL    Comment: Glucose reference range applies only to samples taken after fasting for at least 8 hours.   CT Angio Chest PE W and/or Wo Contrast  Result Date: 06/06/2022 CLINICAL DATA:  Suspected pulmonary embolism. EXAM: CT ANGIOGRAPHY CHEST WITH CONTRAST TECHNIQUE: Multidetector CT imaging of the chest was performed using the standard protocol during bolus administration of intravenous contrast. Multiplanar CT image reconstructions and MIPs were obtained to evaluate the vascular anatomy. RADIATION DOSE REDUCTION: This exam was performed according to the departmental dose-optimization  program which includes automated exposure control, adjustment of the mA and/or kV according to patient size and/or use of iterative reconstruction technique. CONTRAST:  34m OMNIPAQUE IOHEXOL 350 MG/ML SOLN COMPARISON:  None Available. FINDINGS: Cardiovascular: There is mild calcification of the aortic arch, without evidence of aortic aneurysm. Satisfactory opacification of the pulmonary arteries to the segmental level. No evidence of pulmonary embolism. There is mild cardiomegaly and mild coronary artery calcification. No pericardial effusion. Mediastinum/Nodes: There is mild AP window, pretracheal and subcarinal lymphadenopathy. Thyroid gland, trachea, and esophagus demonstrate no significant findings. Lungs/Pleura: Mild to moderate severity patchy posterior right upper lobe infiltrate is seen. Mild areas of atelectasis and/or early infiltrate are also seen within the anterior aspect of the left upper lobe and posterior aspects of the bilateral lung bases. There is a small right pleural effusion. No pneumothorax is identified. Upper Abdomen: No acute abnormality. Musculoskeletal: No chest wall abnormality. No acute or significant osseous findings. Review of the MIP images confirms the above findings. IMPRESSION: 1. No evidence of pulmonary embolism. 2. Mild to moderate severity posterior right upper lobe infiltrate. 3. Mild left upper lobe and bibasilar atelectasis and/or early infiltrate. 4. Small right pleural effusion. 5. Mild cardiomegaly and mild coronary artery calcification. 6. Aortic atherosclerosis. Aortic Atherosclerosis (ICD10-I70.0). Electronically Signed   By: TVirgina NorfolkM.D.   On: 06/06/2022 23:43   DG Chest 2 View  Result Date: 06/06/2022 CLINICAL DATA:  Shortness of breath EXAM: CHEST - 2 VIEW COMPARISON:  Chest x-ray 05/20/2022 FINDINGS: Heart is mildly enlarged. There is central pulmonary vascular congestion. The lungs are clear. There is no pleural effusion or pneumothorax. There  surgical clips in the upper abdomen. No acute fractures are seen. IMPRESSION: Mild cardiomegaly with central pulmonary vascular congestion. Electronically Signed   By: ARonney AstersM.D.   On: 06/06/2022 17:12    Pending Labs Unresulted Labs (From admission, onward)     Start     Ordered   06/08/22 05397 Basic metabolic panel  Daily at 5am,   R     Comments: As Scheduled for 5 days    06/07/22 0047   06/08/22 0500  CBC  Tomorrow morning,   R        06/07/22 0926            Vitals/Pain Today's Vitals   06/07/22  0945 06/07/22 1207 06/07/22 1310 06/07/22 1554  BP: 126/75  118/73   Pulse: (!) 105  (!) 107   Resp: 14  17   Temp:  98.7 F (37.1 C)  98.7 F (37.1 C)  TempSrc:  Oral  Oral  SpO2: 93%  93%   PainSc:        Isolation Precautions Droplet precaution  Medications Medications  aspirin EC tablet 81 mg (81 mg Oral Given 06/07/22 0800)  ALPRAZolam (XANAX) tablet 0.25 mg (has no administration in time range)  albuterol (PROVENTIL) (2.5 MG/3ML) 0.083% nebulizer solution 2.5 mg (has no administration in time range)  atorvastatin (LIPITOR) tablet 40 mg (40 mg Oral Given 06/07/22 0941)  sacubitril-valsartan (ENTRESTO) 24-26 mg per tablet (1 tablet Oral Given 06/07/22 1008)  metoprolol succinate (TOPROL-XL) 24 hr tablet 12.5 mg (12.5 mg Oral Given 06/07/22 0941)  guaiFENesin (MUCINEX) 12 hr tablet 600 mg (600 mg Oral Given 06/07/22 1627)  spironolactone (ALDACTONE) tablet 12.5 mg (12.5 mg Oral Given 06/07/22 1008)  pantoprazole (PROTONIX) EC tablet 40 mg (40 mg Oral Given 06/07/22 0940)  cholecalciferol (VITAMIN D3) 25 MCG (1000 UNIT) tablet 5,000 Units (5,000 Units Oral Given 06/07/22 0800)  methocarbamol (ROBAXIN) tablet 500 mg (has no administration in time range)  sodium chloride flush (NS) 0.9 % injection 3 mL (3 mLs Intravenous Given 06/07/22 0945)  sodium chloride flush (NS) 0.9 % injection 3 mL (has no administration in time range)  0.9 %  sodium chloride infusion  (has no administration in time range)  acetaminophen (TYLENOL) tablet 650 mg (has no administration in time range)  ondansetron (ZOFRAN) injection 4 mg (has no administration in time range)  enoxaparin (LOVENOX) injection 40 mg (40 mg Subcutaneous Given 06/07/22 0943)  cefTRIAXone (ROCEPHIN) 2 g in sodium chloride 0.9 % 100 mL IVPB (has no administration in time range)  azithromycin (ZITHROMAX) 500 mg in sodium chloride 0.9 % 250 mL IVPB (has no administration in time range)  lactated ringers infusion (0 mLs Intravenous Hold 06/07/22 0242)  dextrose 50 % solution 0-50 mL (has no administration in time range)  insulin glargine-yfgn (SEMGLEE) injection 10 Units (10 Units Subcutaneous Given 06/07/22 0801)  furosemide (LASIX) injection 40 mg (40 mg Intravenous Given 06/07/22 0944)  insulin aspart (novoLOG) injection 0-15 Units (8 Units Subcutaneous Given 06/07/22 1628)  oxymetazoline (AFRIN) 0.05 % nasal spray 1 spray (has no administration in time range)  furosemide (LASIX) injection 40 mg (40 mg Intravenous Given 06/06/22 2305)  iohexol (OMNIPAQUE) 350 MG/ML injection 80 mL (80 mLs Intravenous Contrast Given 06/06/22 2330)  cefTRIAXone (ROCEPHIN) 2 g in sodium chloride 0.9 % 100 mL IVPB (0 g Intravenous Stopped 06/07/22 0240)  azithromycin (ZITHROMAX) 500 mg in sodium chloride 0.9 % 250 mL IVPB (0 mg Intravenous Stopped 06/07/22 0341)  potassium chloride SA (KLOR-CON M) CR tablet 40 mEq (40 mEq Oral Given 06/07/22 0940)    Mobility walks Low fall risk   Focused Assessments SOB   R Recommendations: See Admitting Provider Note  Report given to:   Additional Notes:

## 2022-06-07 NOTE — Assessment & Plan Note (Signed)
Patient with acute PNA superimposed on more chronically worsening (over past 1 month) CHF. 1. PNA pathway 2. Rocephin + azithro 3. COVID and flu were negative yesterday 4. RVP 5. S.pneumo urinary antigen

## 2022-06-08 ENCOUNTER — Inpatient Hospital Stay (HOSPITAL_COMMUNITY): Payer: Medicare PPO

## 2022-06-08 DIAGNOSIS — R0609 Other forms of dyspnea: Secondary | ICD-10-CM | POA: Diagnosis not present

## 2022-06-08 DIAGNOSIS — J189 Pneumonia, unspecified organism: Secondary | ICD-10-CM | POA: Diagnosis not present

## 2022-06-08 LAB — BASIC METABOLIC PANEL
Anion gap: 12 (ref 5–15)
BUN: 17 mg/dL (ref 8–23)
CO2: 20 mmol/L — ABNORMAL LOW (ref 22–32)
Calcium: 9.2 mg/dL (ref 8.9–10.3)
Chloride: 103 mmol/L (ref 98–111)
Creatinine, Ser: 0.85 mg/dL (ref 0.61–1.24)
GFR, Estimated: >60 mL/min (ref >60)
Glucose, Bld: 172 mg/dL — ABNORMAL HIGH (ref 70–99)
Potassium: 3.9 mmol/L (ref 3.5–5.1)
Sodium: 135 mmol/L (ref 135–145)

## 2022-06-08 LAB — ECHOCARDIOGRAM COMPLETE
AR max vel: 2.76 cm2
AV Area VTI: 2.33 cm2
AV Area mean vel: 2.48 cm2
AV Mean grad: 4 mmHg
AV Peak grad: 6.3 mmHg
Ao pk vel: 1.25 m/s
Calc EF: 32.8 %
Height: 69 in
P 1/2 time: 185 msec
S' Lateral: 5.7 cm
Single Plane A2C EF: 30.8 %
Single Plane A4C EF: 34 %
Weight: 2948.8 oz

## 2022-06-08 LAB — CBC
HCT: 39.6 % (ref 39.0–52.0)
Hemoglobin: 13.2 g/dL (ref 13.0–17.0)
MCH: 30.8 pg (ref 26.0–34.0)
MCHC: 33.3 g/dL (ref 30.0–36.0)
MCV: 92.3 fL (ref 80.0–100.0)
Platelets: 216 10*3/uL (ref 150–400)
RBC: 4.29 MIL/uL (ref 4.22–5.81)
RDW: 14.5 % (ref 11.5–15.5)
WBC: 8.2 10*3/uL (ref 4.0–10.5)
nRBC: 0 % (ref 0.0–0.2)

## 2022-06-08 LAB — GLUCOSE, CAPILLARY
Glucose-Capillary: 152 mg/dL — ABNORMAL HIGH (ref 70–99)
Glucose-Capillary: 196 mg/dL — ABNORMAL HIGH (ref 70–99)
Glucose-Capillary: 264 mg/dL — ABNORMAL HIGH (ref 70–99)
Glucose-Capillary: 237 mg/dL — ABNORMAL HIGH (ref 70–99)

## 2022-06-08 MED ORDER — POTASSIUM CHLORIDE CRYS ER 20 MEQ PO TBCR
40.0000 meq | EXTENDED_RELEASE_TABLET | Freq: Once | ORAL | Status: AC
  Administered 2022-06-08: 40 meq via ORAL
  Filled 2022-06-08: qty 2

## 2022-06-08 MED ORDER — AYR SALINE NASAL NA GEL
1.0000 | Freq: Four times a day (QID) | NASAL | Status: DC
  Administered 2022-06-08 – 2022-06-09 (×3): 1 via NASAL
  Filled 2022-06-08 (×2): qty 14.1

## 2022-06-08 MED ORDER — AZITHROMYCIN 250 MG PO TABS
500.0000 mg | ORAL_TABLET | Freq: Every day | ORAL | Status: DC
  Administered 2022-06-08 – 2022-06-09 (×2): 500 mg via ORAL
  Filled 2022-06-08 (×2): qty 2

## 2022-06-08 MED ORDER — LORATADINE 5 MG/5ML PO SOLN
10.0000 mg | Freq: Once | ORAL | Status: DC
  Filled 2022-06-08: qty 10

## 2022-06-08 MED ORDER — LORATADINE 10 MG PO TABS
10.0000 mg | ORAL_TABLET | Freq: Every day | ORAL | Status: DC
  Administered 2022-06-08 – 2022-06-10 (×3): 10 mg via ORAL
  Filled 2022-06-08 (×3): qty 1

## 2022-06-08 MED ORDER — INSULIN GLARGINE-YFGN 100 UNIT/ML ~~LOC~~ SOLN
8.0000 [IU] | Freq: Every day | SUBCUTANEOUS | Status: DC
  Administered 2022-06-08: 8 [IU] via SUBCUTANEOUS
  Filled 2022-06-08 (×2): qty 0.08

## 2022-06-08 NOTE — Progress Notes (Signed)
  Echocardiogram 2D Echocardiogram has been performed.  Ricardo Zuniga 06/08/2022, 3:35 PM

## 2022-06-08 NOTE — Progress Notes (Signed)
   06/08/22 1136  Mobility  Activity Ambulated independently in hallway  Level of Assistance Independent  Assistive Device None  Distance Ambulated (ft) 550 ft  Activity Response Tolerated well  Mobility Referral Yes  $Mobility charge 1 Mobility   Mobility Specialist Progress Note  During Mobility: 94% SpO2  Received pt in bed having no complaints and agreeable to mobility. Had slight SOB. Left EOB w/ call bell in reach and all needs met.   Lucious Groves Mobility Specialist

## 2022-06-08 NOTE — Significant Event (Signed)
Pt is short of breath with non productive cough after walk administered a Neb treatment. O2 sat 100%. Currently with ECHO Lab at beside.

## 2022-06-08 NOTE — Progress Notes (Signed)
PROGRESS NOTE    Ricardo Zuniga  RUE:[id number] DOB: Apr 05, 1951 DOA: 06/06/2022 PCP: Glenda Chroman, MD    Ricardo Zuniga is a 71 y.o. male with medical history significant of Chronic systolic CHF EF 14%,, APML in remission, DM2, HTN. Pt with SOB for the past month Abruptly worsening in past couple of days with subjective fever at home.  Mild cough, has mild BLE Edema, no h/o DVT nor PE. Chronic baseline Tachycardia per patient. In ED, VSS, labs w/ mild leukocytosis, BNP 1624, C Chest w/ Multifocal PNA   Subjective: Now complains of sinus, facial congestion -Breathing improving overall  Assessment and Plan:  CAP (community acquired pneumonia) -Flu and COVID PCR were negative, follow-up respiratory virus panel -Continue Rocephin and azithromycin -For sinus congestion add Claritin, saline nasal spray and Afrin X1 day  Acute on chronic systolic CHF -Last echo in July with EF of 45% -With progressive dyspnea on exertion over the last few months will repeat echo to ensure no changes from baseline -Continue IV Lasix today, he is 2.7 L negative -Continue Aldactone, Entresto, beta-blocker -Mild metabolic acidosis could have been related to Jardiance, now on hold  History of nonobstructive CAD -Continue outpatient follow-up for coronary CTA, due to be done soon as outpatient -No ACS at this time -Continue aspirin, metoprolol and statin  Type 2 diabetes mellitus -Mild ketosis, do not suspect DKA, likely with acute illness and poor oral intake -Semglee today, discontinued insulin drip and dextrose -HbA1c is 6.6  Essential hypertension Continue entresto, aldactone, metoprolol  Hyperlipidemia Continue statin.   DVT prophylaxis: Lovenox Code Status: Full Code Family Communication: Patient and wife at bedside yesterday Disposition Plan: Home likely 1 to 2 days  Consultants:    Procedures:   Antimicrobials:    Objective: Vitals:   06/07/22 2100 06/08/22 0016 06/08/22  0424 06/08/22 0742  BP: 113/83 103/70 104/65 111/71  Pulse: 100 97 92 92  Resp: '20 20 20 18  '$ Temp: 99.3 F (37.4 C) 98.1 F (36.7 C) 98 F (36.7 C) 98.6 F (37 C)  TempSrc: Oral Oral Oral Oral  SpO2: 97% 97% 96% 96%  Weight: 85 kg  83.6 kg   Height: '5\' 9"'$  (1.753 m)       Intake/Output Summary (Last 24 hours) at 06/08/2022 0954 Last data filed at 06/08/2022 7829 Gross per 24 hour  Intake 240 ml  Output 675 ml  Net -435 ml   Filed Weights   06/07/22 2100 06/08/22 0424  Weight: 85 kg 83.6 kg    Examination:  General exam: Pleasant male sitting up in bed, AAOx3, no distress HEENT: No JVD CVS: S1-S2, regular rhythm Lungs: Few basilar rales Abdomen: Soft, nontender, bowel sounds present Extremities: No edema Neuro: Moves all extremities, no localizing signs Skin: No rashes Psychiatry:  Mood & affect appropriate.     Data Reviewed:   CBC: Recent Labs  Lab 06/06/22 1644 06/08/22 0041  WBC 12.0* 8.2  HGB 14.9 13.2  HCT 44.9 39.6  MCV 95.1 92.3  PLT 243 562   Basic Metabolic Panel: Recent Labs  Lab 06/06/22 1644 06/07/22 0514 06/08/22 0041  NA 138 136 135  K 4.3 3.7 3.9  CL 103 100 103  CO2 21* 23 20*  GLUCOSE 206* 116* 172*  BUN '16 14 17  '$ CREATININE 1.17 1.03 0.85  CALCIUM 9.5 8.8* 9.2   GFR: Estimated Creatinine Clearance: 78.6 mL/min (by C-G formula based on SCr of 0.85 mg/dL). Liver Function Tests: No results for input(s): "  AST", "ALT", "ALKPHOS", "BILITOT", "PROT", "ALBUMIN" in the last 168 hours. No results for input(s): "LIPASE", "AMYLASE" in the last 168 hours. No results for input(s): "AMMONIA" in the last 168 hours. Coagulation Profile: No results for input(s): "INR", "PROTIME" in the last 168 hours. Cardiac Enzymes: No results for input(s): "CKTOTAL", "CKMB", "CKMBINDEX", "TROPONINI" in the last 168 hours. BNP (last 3 results) No results for input(s): "PROBNP" in the last 8760 hours. HbA1C: Recent Labs    06/07/22 0514  HGBA1C 6.6*    CBG: Recent Labs  Lab 06/07/22 0520 06/07/22 0639 06/07/22 1618 06/07/22 2208 06/08/22 0607  GLUCAP 114* 114* 254* 195* 152*   Lipid Profile: No results for input(s): "CHOL", "HDL", "LDLCALC", "TRIG", "CHOLHDL", "LDLDIRECT" in the last 72 hours. Thyroid Function Tests: No results for input(s): "TSH", "T4TOTAL", "FREET4", "T3FREE", "THYROIDAB" in the last 72 hours. Anemia Panel: No results for input(s): "VITAMINB12", "FOLATE", "FERRITIN", "TIBC", "IRON", "RETICCTPCT" in the last 72 hours. Urine analysis:    Component Value Date/Time   COLORURINE YELLOW 06/06/2022 [place] 06/06/2022 2254   LABSPEC 1.031 (H) 06/06/2022 2254   PHURINE 5.0 06/06/2022 2254   GLUCOSEU >=500 (A) 06/06/2022 2254   HGBUR SMALL (A) 06/06/2022 2254   BILIRUBINUR NEGATIVE 06/06/2022 2254   KETONESUR 80 (A) 06/06/2022 2254   PROTEINUR 30 (A) 06/06/2022 2254   NITRITE NEGATIVE 06/06/2022 2254   LEUKOCYTESUR NEGATIVE 06/06/2022 2254   Sepsis Labs: '@LABRCNTIP'$ (procalcitonin:4,lacticidven:4)  ) Recent Results (from the past 240 hour(s))  Respiratory (~20 pathogens) panel by PCR     Status: None   Collection Time: 06/07/22  9:43 AM   Specimen: Nasopharyngeal Swab; Respiratory  Result Value Ref Range Status   Adenovirus NOT DETECTED NOT DETECTED Final   Coronavirus 229E NOT DETECTED NOT DETECTED Final    Comment: (NOTE) The Coronavirus on the Respiratory Panel, DOES NOT test for the novel  Coronavirus (2019 nCoV)    Coronavirus HKU1 NOT DETECTED NOT DETECTED Final   Coronavirus NL63 NOT DETECTED NOT DETECTED Final   Coronavirus OC43 NOT DETECTED NOT DETECTED Final   Metapneumovirus NOT DETECTED NOT DETECTED Final   Rhinovirus / Enterovirus NOT DETECTED NOT DETECTED Final   Influenza A NOT DETECTED NOT DETECTED Final   Influenza B NOT DETECTED NOT DETECTED Final   Parainfluenza Virus 1 NOT DETECTED NOT DETECTED Final   Parainfluenza Virus 2 NOT DETECTED NOT DETECTED Final    Parainfluenza Virus 3 NOT DETECTED NOT DETECTED Final   Parainfluenza Virus 4 NOT DETECTED NOT DETECTED Final   Respiratory Syncytial Virus NOT DETECTED NOT DETECTED Final   Bordetella pertussis NOT DETECTED NOT DETECTED Final   Bordetella Parapertussis NOT DETECTED NOT DETECTED Final   Chlamydophila pneumoniae NOT DETECTED NOT DETECTED Final   Mycoplasma pneumoniae NOT DETECTED NOT DETECTED Final    Comment: Performed at [facility], [place] [address], [place], [place] [address]     Radiology Studies: CT Angio Chest PE W and/or Wo Contrast  Result Date: 06/06/2022 CLINICAL DATA:  Suspected pulmonary embolism. EXAM: CT ANGIOGRAPHY CHEST WITH CONTRAST TECHNIQUE: Multidetector CT imaging of the chest was performed using the standard protocol during bolus administration of intravenous contrast. Multiplanar CT image reconstructions and MIPs were obtained to evaluate the vascular anatomy. RADIATION DOSE REDUCTION: This exam was performed according to the departmental dose-optimization program which includes automated exposure control, adjustment of the mA and/or kV according to patient size and/or use of iterative reconstruction technique. CONTRAST:  55m OMNIPAQUE IOHEXOL 350 MG/ML SOLN COMPARISON:  None Available. FINDINGS: Cardiovascular: There is mild calcification of the aortic arch, without evidence of aortic aneurysm. Satisfactory opacification of the pulmonary arteries to the segmental level. No evidence of pulmonary embolism. There is mild cardiomegaly and mild coronary artery calcification. No pericardial effusion. Mediastinum/Nodes: There is mild AP window, pretracheal and subcarinal lymphadenopathy. Thyroid gland, trachea, and esophagus demonstrate no significant findings. Lungs/Pleura: Mild to moderate severity patchy posterior right upper lobe infiltrate is seen. Mild areas of atelectasis and/or early infiltrate are also seen within the anterior aspect of the left upper lobe and posterior  aspects of the bilateral lung bases. There is a small right pleural effusion. No pneumothorax is identified. Upper Abdomen: No acute abnormality. Musculoskeletal: No chest wall abnormality. No acute or significant osseous findings. Review of the MIP images confirms the above findings. IMPRESSION: 1. No evidence of pulmonary embolism. 2. Mild to moderate severity posterior right upper lobe infiltrate. 3. Mild left upper lobe and bibasilar atelectasis and/or early infiltrate. 4. Small right pleural effusion. 5. Mild cardiomegaly and mild coronary artery calcification. 6. Aortic atherosclerosis. Aortic Atherosclerosis (ICD10-I70.0). Electronically Signed   By: Virgina Norfolk M.D.   On: 06/06/2022 23:43   DG Chest 2 View  Result Date: 06/06/2022 CLINICAL DATA:  Shortness of breath EXAM: CHEST - 2 VIEW COMPARISON:  Chest x-ray 05/20/2022 FINDINGS: Heart is mildly enlarged. There is central pulmonary vascular congestion. The lungs are clear. There is no pleural effusion or pneumothorax. There surgical clips in the upper abdomen. No acute fractures are seen. IMPRESSION: Mild cardiomegaly with central pulmonary vascular congestion. Electronically Signed   By: Ronney Asters M.D.   On: 06/06/2022 17:12     Scheduled Meds:  aspirin EC  81 mg Oral q AM   atorvastatin  40 mg Oral Daily   cholecalciferol  5,000 Units Oral q AM   enoxaparin (LOVENOX) injection  40 mg Subcutaneous Q24H   furosemide  40 mg Intravenous Daily   insulin aspart  0-15 Units Subcutaneous TID WC   insulin glargine-yfgn  10 Units Subcutaneous Daily   loratadine  10 mg Oral Daily   metoprolol succinate  12.5 mg Oral Daily   pantoprazole  40 mg Oral Daily   sacubitril-valsartan  1 tablet Oral BID   saline  1 Application Each Nare O5D   sodium chloride flush  3 mL Intravenous Q12H   spironolactone  12.5 mg Oral Daily   Continuous Infusions:  sodium chloride     azithromycin 500 mg (06/07/22 2309)   cefTRIAXone (ROCEPHIN)  IV 2 g  (06/07/22 2216)   lactated ringers Stopped (06/07/22 0242)     LOS: 1 day    Time spent: 48mn    PDomenic Polite MD [facility]   06/08/2022, 9:54 AM

## 2022-06-08 NOTE — Progress Notes (Signed)
PHARMACIST - PHYSICIAN COMMUNICATION DR:   Broadus John CONCERNING: Antibiotic IV to Oral Route Change Policy  RECOMMENDATION: This patient is receiving azithromycin by the intravenous route.  Based on criteria approved by the Pharmacy and Therapeutics Committee, the antibiotic(s) is/are being converted to the equivalent oral dose form(s).   DESCRIPTION: These criteria include: Patient being treated for a respiratory tract infection, urinary tract infection, cellulitis or clostridium difficile associated diarrhea if on metronidazole The patient is not neutropenic and does not exhibit a GI malabsorption state The patient is eating (either orally or via tube) and/or has been taking other orally administered medications for a least 24 hours The patient is improving clinically and has a Tmax < 100.5  If you have questions about this conversion, please contact the Pharmacy Department  '[]'$   [phone] )  [facility] '[]'$   [phone] )  [facility] '[x]'$   [phone] )  [facility] '[]'$   [phone] )  [facility] '[]'$   [phone] )  [facility]

## 2022-06-08 NOTE — Plan of Care (Signed)
  Problem: Respiratory: Goal: Will regain and/or maintain adequate ventilation Outcome: Progressing   Problem: Urinary Elimination: Goal: Ability to achieve and maintain adequate renal perfusion and functioning will improve Outcome: Progressing   Problem: Activity: Goal: Risk for activity intolerance will decrease Outcome: Progressing

## 2022-06-09 ENCOUNTER — Encounter (HOSPITAL_COMMUNITY): Payer: Self-pay | Admitting: Internal Medicine

## 2022-06-09 DIAGNOSIS — I5023 Acute on chronic systolic (congestive) heart failure: Secondary | ICD-10-CM | POA: Diagnosis not present

## 2022-06-09 DIAGNOSIS — J189 Pneumonia, unspecified organism: Secondary | ICD-10-CM | POA: Diagnosis not present

## 2022-06-09 DIAGNOSIS — I509 Heart failure, unspecified: Secondary | ICD-10-CM

## 2022-06-09 LAB — GLUCOSE, CAPILLARY
Glucose-Capillary: 130 mg/dL — ABNORMAL HIGH (ref 70–99)
Glucose-Capillary: 173 mg/dL — ABNORMAL HIGH (ref 70–99)
Glucose-Capillary: 250 mg/dL — ABNORMAL HIGH (ref 70–99)
Glucose-Capillary: 200 mg/dL — ABNORMAL HIGH (ref 70–99)

## 2022-06-09 LAB — BASIC METABOLIC PANEL
Anion gap: 10 (ref 5–15)
BUN: 19 mg/dL (ref 8–23)
CO2: 23 mmol/L (ref 22–32)
Calcium: 9 mg/dL (ref 8.9–10.3)
Chloride: 103 mmol/L (ref 98–111)
Creatinine, Ser: 1.02 mg/dL (ref 0.61–1.24)
GFR, Estimated: >60 mL/min (ref >60)
Glucose, Bld: 145 mg/dL — ABNORMAL HIGH (ref 70–99)
Potassium: 3.9 mmol/L (ref 3.5–5.1)
Sodium: 136 mmol/L (ref 135–145)

## 2022-06-09 LAB — CBC
HCT: 36.3 % — ABNORMAL LOW (ref 39.0–52.0)
Hemoglobin: 12.8 g/dL — ABNORMAL LOW (ref 13.0–17.0)
MCH: 31.8 pg (ref 26.0–34.0)
MCHC: 35.3 g/dL (ref 30.0–36.0)
MCV: 90.1 fL (ref 80.0–100.0)
Platelets: 230 10*3/uL (ref 150–400)
RBC: 4.03 MIL/uL — ABNORMAL LOW (ref 4.22–5.81)
RDW: 14 % (ref 11.5–15.5)
WBC: 6.1 10*3/uL (ref 4.0–10.5)
nRBC: 0 % (ref 0.0–0.2)

## 2022-06-09 MED ORDER — MELATONIN 5 MG PO TABS
5.0000 mg | ORAL_TABLET | Freq: Every day | ORAL | Status: DC
  Administered 2022-06-09: 5 mg via ORAL
  Filled 2022-06-09: qty 1

## 2022-06-09 MED ORDER — ALPRAZOLAM 0.25 MG PO TABS
0.2500 mg | ORAL_TABLET | Freq: Two times a day (BID) | ORAL | Status: DC | PRN
  Administered 2022-06-09 (×2): 0.25 mg via ORAL
  Filled 2022-06-09 (×2): qty 1

## 2022-06-09 MED ORDER — INSULIN GLARGINE-YFGN 100 UNIT/ML ~~LOC~~ SOLN
15.0000 [IU] | Freq: Every day | SUBCUTANEOUS | Status: DC
  Filled 2022-06-09: qty 0.15

## 2022-06-09 MED ORDER — FLUTICASONE PROPIONATE 50 MCG/ACT NA SUSP
1.0000 | Freq: Every day | NASAL | Status: DC
  Administered 2022-06-09 – 2022-06-10 (×2): 1 via NASAL
  Filled 2022-06-09: qty 16

## 2022-06-09 MED ORDER — SODIUM CHLORIDE 0.9% FLUSH
3.0000 mL | Freq: Two times a day (BID) | INTRAVENOUS | Status: DC
  Administered 2022-06-09 – 2022-06-10 (×3): 3 mL via INTRAVENOUS

## 2022-06-09 MED ORDER — BENZONATATE 100 MG PO CAPS
100.0000 mg | ORAL_CAPSULE | Freq: Three times a day (TID) | ORAL | Status: DC
  Administered 2022-06-09 – 2022-06-10 (×4): 100 mg via ORAL
  Filled 2022-06-09 (×4): qty 1

## 2022-06-09 MED ORDER — FUROSEMIDE 40 MG PO TABS
40.0000 mg | ORAL_TABLET | Freq: Once | ORAL | Status: AC
  Administered 2022-06-09: 40 mg via ORAL
  Filled 2022-06-09: qty 1

## 2022-06-09 MED ORDER — INSULIN GLARGINE-YFGN 100 UNIT/ML ~~LOC~~ SOLN
6.0000 [IU] | Freq: Every day | SUBCUTANEOUS | Status: DC
  Administered 2022-06-10: 6 [IU] via SUBCUTANEOUS
  Filled 2022-06-09: qty 0.06

## 2022-06-09 NOTE — Progress Notes (Signed)
Heart Failure Navigator Progress Note  Assessed for Heart & Vascular TOC clinic readiness.  Patient does not meet criteria due to Advanced Heart Failure consult.Earnestine Leys, BSN, RN Heart Failure Transport planner Only

## 2022-06-09 NOTE — Consult Note (Signed)
   [facility] Inpatient Consult   06/09/2022  Ricardo Zuniga 04-Jan-1951 [id number]  [place] Organization [ACO] Patient: [employer] P[employer]  Primary Care Provider:  Glenda Chroman, MD  (Patient on the [facility] banner}  Patient screened for hospitalization for potential [place] Coordination  service needs for post hospital transition.   3:20 pm Rounding on unit and came by to speak with patient however, not on the door states : Do Not Disturb.   Plan:  Will Continue to follow progress and disposition to assess for post hospital care management needs.    For questions contact:   Natividad Brood, RN BSN [facility] Liaison  [phone] business mobile phone Toll free office [phone]  Fax number: [phone] [email or web address]

## 2022-06-09 NOTE — H&P (View-Only) (Signed)
Advanced Heart Failure Team Consult Note   Primary Physician: Glenda Chroman, MD PCP-Cardiologist:  [name], MD  Reason for Consultation: acute on chronic systolic heart failure  HPI:    Ricardo Zuniga is seen today for evaluation of acute on chronic systolic heart failure at the request of Dr. Broadus John, Internal Medicine.    71 y/o male w/ chronic systolic heart failure due to NICM, T2DM,  H/o APML (acute promyelocytic leukemia) completed treatment at [facility] in 2014, now in remission. 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 He was treated w/ abx. Per patient and wife report, his hematologist did not think that his CM was chemo-induced, and felt mostly likely triggered by endocarditis.   Repeat echo in 2017 showed interval improvement,  LVEF up to 35-40%.   Had [place] in 2020 that showed mod nonobstructive one-vessel coronary artery disease, prox RCA 40% stenosed.   Echo 7/21 EF 50%, RV normal   Echo 7/23 EF mildly reduced, 45%.   Over the last month has struggled w/ exertional dyspnea and fatigue. No chest pain. Also bothered w/ head and nasal congestion. W/u in the ED revealed acute CHF. Imaging also suggestive of PNA, though PCT < 0.10. Respiratory panel negative. Chest CT negative for PE, + for mild-mod LUL and bibasilar atelectasis and/or early infiltrate, small rt pleural effusion, mild cardiomyopathy and mild coronary artery calcifications. BNP elevated at 1,624. Hs trop 19>>31.  Admitted by IM and started on abx and IV Lasix, w/ good urinary response. Still feels tired and SOB w/ exertion.   Echo completed this admit, shows further reduction in LVEF down to 30-35%, Global HK, mildly reduced RV and mild MR. AHF team asked to assess.    Other labs, SCr stable 1.02. CO2 23, K 3.9. Also w/ mild ketosis on admit. Glucose 206, beta- hydroxybutyric acid elevated at  0.74. UA + for ketone.   Hgb A1c 6.6 (down from 8.7)  Of note, pt also w/o polyneuropathy including carpal tunnel s/p CTR and spinal stenosis.   Echo 05/2012 EF 50%, RV normal  Echo 05/2014 EF 25-30%, RV mildly reduced Echo 05/2016 EF 35-40%, RV normal  Echo 08/2018 EF 40%, RV normal  Echo 7/21 EF 50%, RV normal  Echo 8/22 EF 40-45%, RV normal  Echo 7/23 EF 45%, RV normal  Echo 06/08/22 EF 30-35%, global HK, RV mildly reduced, mild MR   LHC 09/2018: Moderate nonobstructive one-vessel coronary artery disease, prox RCA 40% stenosed.    Review of Systems: [y] = yes, '[ ]'$  = no   General: Weight gain '[ ]'$ ; Weight loss '[ ]'$ ; Anorexia '[ ]'$ ; Fatigue [ Y]; Fever '[ ]'$ ; Chills '[ ]'$ ; Weakness '[ ]'$   Cardiac: Chest pain/pressure '[ ]'$ ; Resting SOB [Y ]; Exertional SOB [Y ]; Orthopnea '[ ]'$ ; Pedal Edema '[ ]'$ ; Palpitations '[ ]'$ ; Syncope '[ ]'$ ; Presyncope '[ ]'$ ; Paroxysmal nocturnal dyspnea'[ ]'$   Pulmonary: Cough '[ ]'$ ; Wheezing'[ ]'$ ; Hemoptysis'[ ]'$ ; Sputum '[ ]'$ ; Snoring '[ ]'$   GI: Vomiting'[ ]'$ ; Dysphagia'[ ]'$ ; Melena'[ ]'$ ; Hematochezia '[ ]'$ ; Heartburn'[ ]'$ ; Abdominal pain '[ ]'$ ; Constipation '[ ]'$ ; Diarrhea '[ ]'$ ; BRBPR '[ ]'$   GU: Hematuria'[ ]'$ ; Dysuria '[ ]'$ ; Nocturia'[ ]'$   Vascular: Pain in legs with walking '[ ]'$ ; Pain in feet with lying flat '[ ]'$ ; Non-healing sores '[ ]'$ ; Stroke '[ ]'$ ; TIA '[ ]'$ ; Slurred speech '[ ]'$ ;  Neuro:  Headaches'[ ]'$ ; Vertigo'[ ]'$ ; Seizures'[ ]'$ ; Paresthesias'[ ]'$ ;Blurred vision '[ ]'$ ; Diplopia '[ ]'$ ; Vision changes '[ ]'$   Ortho/Skin: Arthritis '[ ]'$ ; Joint pain '[ ]'$ ; Muscle pain '[ ]'$ ; Joint swelling '[ ]'$ ; Back Pain '[ ]'$ ; Rash '[ ]'$   Psych: Depression'[ ]'$ ; Anxiety'[ ]'$   Heme: Bleeding problems '[ ]'$ ; Clotting disorders '[ ]'$ ; Anemia '[ ]'$   Endocrine: Diabetes [Y ]; Thyroid dysfunction'[ ]'$   Home Medications Prior to Admission medications   Medication Sig Start Date End Date Taking? Authorizing Provider  acetaminophen (TYLENOL) 650 MG CR tablet Take 650-1,300 mg by mouth every 8 (eight) hours as needed for pain.   Yes [provider]  albuterol  (VENTOLIN HFA) 108 (90 Base) MCG/ACT inhaler Inhale 2 puffs into the lungs every 6 (six) hours as needed for wheezing or shortness of breath. 05/23/22  Yes Loel Dubonnet, NP  ALPRAZolam Duanne Moron) 0.25 MG tablet Take 0.25 mg by mouth at bedtime as needed for anxiety or sleep. 05/13/22  Yes [provider]  aspirin EC 81 MG tablet Take 81 mg by mouth in the morning.   Yes [provider]  atorvastatin (LIPITOR) 40 MG tablet Take 1 tablet (40 mg total) by mouth daily. 03/21/22  Yes Satira Sark, MD  Cholecalciferol (VITAMIN D3) 125 MCG (5000 UT) TABS Take 1 tablet by mouth in the morning.   Yes [provider]  diazepam (VALIUM) 5 MG tablet Take 5 mg by mouth daily as needed (vertigo). 10/17/21  Yes [provider]  ENTRESTO 24-26 MG Take 1 tablet by mouth twice daily 02/24/22  Yes Satira Sark, MD  furosemide (LASIX) 20 MG tablet Take 1 tablet (20 mg total) by mouth daily. 02/28/22  Yes Satira Sark, MD  furosemide (LASIX) 20 MG tablet Take 40 mg by mouth in the morning, and 20 mg by mouth in the evening, for 7 days 06/06/22  Yes Trifan, Carola Rhine, MD  guaiFENesin (MUCINEX) 600 MG 12 hr tablet Take 600 mg by mouth daily as needed (congestion.).   Yes [provider]  JARDIANCE 10 MG TABS tablet Take 10 mg by mouth every evening. 12/18/21  Yes [provider]  metoprolol succinate (TOPROL-XL) 25 MG 24 hr tablet Take 1 tablet (25 mg total) by mouth daily. Patient taking differently: Take 12.5 mg by mouth in the morning. 03/18/21  Yes Verta Ellen., NP  omeprazole (PRILOSEC) 20 MG capsule Take 20 mg by mouth daily before breakfast.   Yes [provider]  ondansetron (ZOFRAN) 4 MG tablet Take 1 tablet (4 mg total) by mouth every 6 (six) hours as needed for nausea. 11/03/21  Yes Sheikh, Omair Latif, DO  potassium chloride (KLOR-CON) 10 MEQ tablet Take 1 tablet (10 mEq total) by mouth daily for 30 doses. 06/06/22 07/06/22 Yes  Trifan, Carola Rhine, MD  repaglinide (PRANDIN) 2 MG tablet Take 2 mg by mouth 3 (three) times daily before meals.  07/06/17  Yes [provider]  spironolactone (ALDACTONE) 25 MG tablet Take 1/2 (one-half) tablet by mouth once daily 03/19/22  Yes Satira Sark, MD  zinc gluconate 50 MG tablet Take 50 mg by mouth in the morning.   Yes [provider]  methocarbamol (ROBAXIN) 500 MG tablet Take 1 tablet (500 mg total) by mouth every 6 (six) hours as needed for muscle spasms. Patient not taking: Reported on 06/07/2022 01/03/22   Kristeen Miss, MD  naproxen sodium (ALEVE) 220 MG tablet Take 220-440 mg by mouth daily as  needed (pain.).    [provider]    Past Medical History: Past Medical History:  Diagnosis Date   Anal fissure    APML (acute promyelocytic leukemia) in remission ([place])    Completed treatment 04/2013   Coronary artery disease    Nonobstructive at cardiac catheterization 09/2018   Difficult intubation    w/shoulder surgery at surgical center between 2005-2008   Gastroesophageal reflux disease    History of blood transfusion 2014   History of kidney stones    passed stones   Hypercholesteremia    Neuropathy    bilateral feet   Nonischemic cardiomyopathy ([place])    a. EF 20% in 2015 - thought to possibly be viral induced or chemotherapy induced from treatments in 2014 b. at 35-40% by echo in 05/2016    Peptic ulcer disease    Pneumonia    Yrs ago   Type 2 diabetes mellitus ([place])    Ureteral colic    Vertigo    tx with valium prn   Wears glasses    Wears hearing aid in both ears     Past Surgical History: Past Surgical History:  Procedure Laterality Date   ANAL FISSURE REPAIR     APPENDECTOMY     BIOPSY  08/17/2020   Procedure: BIOPSY;  Surgeon: Harvel Quale, MD;  [facility];  Service: Gastroenterology;;   Wilmon Pali RELEASE Left 10/16/2017   Procedure: LEFT CARPAL TUNNEL RELEASE;  Surgeon: Earlie Server, MD;   Location: [place];  Service: Orthopedics;  Laterality: Left;   CARPAL TUNNEL RELEASE Right 11/25/2019   Procedure: CARPAL TUNNEL RELEASE;  Surgeon: Earlie Server, MD;  Location: [facility];  Service: Orthopedics;  Laterality: Right;   CATARACT EXTRACTION, BILATERAL     CERVICAL DISC ARTHROPLASTY N/A 01/05/2018   Procedure: Artificial Cervical Disc ReplacementCervical Three-Four, Cervical Four-Five;  Surgeon: Kristeen Miss, MD;  Location: [place];  Service: Neurosurgery;  Laterality: N/A;   CERVICAL FUSION     CHOLECYSTECTOMY     COLONOSCOPY N/A 06/27/2015   Procedure: COLONOSCOPY;  Surgeon: Rogene Houston, MD;  [facility];  Service: Endoscopy;  Laterality: N/A;  730   COLONOSCOPY WITH PROPOFOL N/A 08/17/2020   Procedure: COLONOSCOPY WITH PROPOFOL;  Surgeon: Harvel Quale, MD;  [facility];  Service: Gastroenterology;  Laterality: N/A;  11:15   COLONOSCOPY WITH PROPOFOL N/A 10/23/2021   Procedure: COLONOSCOPY WITH PROPOFOL;  Surgeon: Harvel Quale, MD;  [facility];  Service: Gastroenterology;  Laterality: N/A;  9:45am   ESOPHAGOGASTRODUODENOSCOPY (EGD) WITH PROPOFOL N/A 08/17/2020   Procedure: ESOPHAGOGASTRODUODENOSCOPY (EGD) WITH PROPOFOL;  Surgeon: Harvel Quale, MD;  [facility];  Service: Gastroenterology;  Laterality: N/A;   KNEE SURGERY     x3   LEFT AND RIGHT HEART CATHETERIZATION WITH CORONARY ANGIOGRAM N/A 12/12/2013   Procedure: LEFT AND RIGHT HEART CATHETERIZATION WITH CORONARY ANGIOGRAM;  Surgeon: [name], MD;  Location: [facility] C[facility] LAB;  Service: Cardiovascular;  Laterality: N/A;   LEFT HEART CATH AND CORONARY ANGIOGRAPHY N/A 09/17/2018   Procedure: LEFT HEART CATH AND CORONARY ANGIOGRAPHY;  Surgeon: Wellington Hampshire, MD;  Location: [place] CV LAB;  Service: Cardiovascular;  Laterality: N/A;   LUMBAR FUSION     LUMBAR LAMINECTOMY/DECOMPRESSION MICRODISCECTOMY Bilateral 01/02/2022   Procedure: Bilateral  Lumbar Three-Four/Lumbar Four-Five Sublaminar decompression;  Surgeon: Kristeen Miss, MD;  Location: [place];  Service: Neurosurgery;  Laterality: Bilateral;  3C/RM 21 To Follow Dr Annette Stable  POLYPECTOMY  08/17/2020   Procedure: POLYPECTOMY;  Surgeon: Harvel Quale, MD;  [facility];  Service: Gastroenterology;;   POLYPECTOMY  10/23/2021   Procedure: POLYPECTOMY;  Surgeon: Harvel Quale, MD;  [facility];  Service: Gastroenterology;;   SHOULDER ACROMIOPLASTY Right 07/18/2015   Procedure: SHOULDER ACROMIOPLASTY;  Surgeon: Earlie Server, MD;  Location: [place];  Service: Orthopedics;  Laterality: Right;   SHOULDER ARTHROSCOPY Right 07/18/2015   Procedure: Right shoulder arthroscopy with debridement;  Surgeon: Earlie Server, MD;  Location: [place];  Service: Orthopedics;  Laterality: Right;   SHOULDER SURGERY     x3   TONSILLECTOMY      Family History: Family History  Problem Relation Age of Onset   Colon cancer Other     Social History: Social History   Socioeconomic History   Marital status: Married    Spouse name: Not on file   Number of children: Not on file   Years of education: Not on file   Highest education level: Not on file  Occupational History   Occupation: RETIRED    Comment: [employer]   Occupation: SECURITY GUARD  Tobacco Use   Smoking status: Former    Packs/day: 2.00    Years: 17.00    Total pack years: 34.00    Types: Cigarettes    Start date: 02/17/1968    Quit date: 02/28/1986    Years since quitting: 36.3   Smokeless tobacco: Never  Vaping Use   Vaping Use: Never used  Substance and Sexual Activity   Alcohol use: No    Alcohol/week: 0.0 standard drinks of alcohol   Drug use: No   Sexual activity: Not Currently  Other Topics Concern   Not on file  Social History Narrative   Not on file   Social Determinants of Health   Financial Resource Strain: Not on file  Food  Insecurity: No Food Insecurity (06/07/2022)   Hunger Vital Sign    Worried About Running Out of Food in the Last Year: Never true    [place] in the Last Year: Never true  Transportation Needs: No Transportation Needs (06/07/2022)   PRAPARE - Hydrologist (Medical): No    Lack of Transportation (Non-Medical): No  Physical Activity: Not on file  Stress: Not on file  Social Connections: Not on file    Allergies:  Allergies  Allergen Reactions   Flonase [Fluticasone]    Lisinopril Cough    Objective:    Vital Signs:   Temp:  [97.9 F (36.6 C)-98 F (36.7 C)] 97.9 F (36.6 C) (10/30 0844) Pulse Rate:  [96-109] 96 (10/30 0844) Resp:  [18] 18 (10/30 0844) BP: (100-126)/(63-76) 102/63 (10/30 0844) SpO2:  [95 %-99 %] 95 % (10/30 0844) Weight:  [84.5 kg] 84.5 kg (10/30 0844) Last BM Date : 06/08/22  Weight change: Filed Weights   06/07/22 2100 06/08/22 0424 06/09/22 0844  Weight: 85 kg 83.6 kg 84.5 kg    Intake/Output:   Intake/Output Summary (Last 24 hours) at 06/09/2022 1114 Last data filed at 06/09/2022 0854 Gross per 24 hour  Intake 920.06 ml  Output 1625 ml  Net -704.94 ml      Physical Exam    General:  Well appearing. No resp difficulty HEENT: normal Neck: supple. JVP . Carotids 2+ bilat; no bruits. No lymphadenopathy or thyromegaly appreciated. Cor: PMI nondisplaced. Regular rate & rhythm. No rubs, gallops or murmurs.  Lungs: clear Abdomen: soft, nontender, nondistended. No hepatosplenomegaly. No bruits or masses. Good bowel sounds. Extremities: no cyanosis, clubbing, rash, edema Neuro: alert & orientedx3, cranial nerves grossly intact. moves all 4 extremities w/o difficulty. Affect pleasant   Telemetry   Sinus tach w/ PACs low 100s   EKG    Sinus tach 119 bpm   Labs   Basic Metabolic Panel: Recent Labs  Lab 06/06/22 1644 06/07/22 0514 06/08/22 0041 06/09/22 0035  NA 138 136 135 136  K 4.3 3.7 3.9 3.9   CL 103 100 103 103  CO2 21* 23 20* 23  GLUCOSE 206* 116* 172* 145*  BUN '16 14 17 19  '$ CREATININE 1.17 1.03 0.85 1.02  CALCIUM 9.5 8.8* 9.2 9.0    Liver Function Tests: No results for input(s): "AST", "ALT", "ALKPHOS", "BILITOT", "PROT", "ALBUMIN" in the last 168 hours. No results for input(s): "LIPASE", "AMYLASE" in the last 168 hours. No results for input(s): "AMMONIA" in the last 168 hours.  CBC: Recent Labs  Lab 06/06/22 1644 06/08/22 0041 06/09/22 0035  WBC 12.0* 8.2 6.1  HGB 14.9 13.2 12.8*  HCT 44.9 39.6 36.3*  MCV 95.1 92.3 90.1  PLT 243 216 230    Cardiac Enzymes: No results for input(s): "CKTOTAL", "CKMB", "CKMBINDEX", "TROPONINI" in the last 168 hours.  BNP: BNP (last 3 results) Recent Labs    10/13/21 0920 06/06/22 1649  BNP 137.0* 1,624.1*    ProBNP (last 3 results) No results for input(s): "PROBNP" in the last 8760 hours.   CBG: Recent Labs  Lab 06/08/22 1110 06/08/22 1542 06/08/22 2131 06/09/22 0619 06/09/22 1111  GLUCAP 196* 264* 237* 130* 173*    Coagulation Studies: No results for input(s): "LABPROT", "INR" in the last 72 hours.   Imaging   ECHOCARDIOGRAM COMPLETE  Result Date: 06/08/2022    ECHOCARDIOGRAM REPORT   Patient Name:   Ricardo Zuniga Date of Exam: 06/08/2022 Medical Rec #:  [id number]        Height:       69.0 in Accession #:    [id number]       Weight:       184.3 lb Date of Birth:  Apr 01, 1951        BSA:          1.995 m Patient Age:    32 years         BP:           105/70 mmHg Patient Gender: M                HR:           107 bpm. Exam Location:  Inpatient Procedure: 2D Echo, 3D Echo, Cardiac Doppler and Color Doppler Indications:    Dyspnea  History:        Patient has prior history of Echocardiogram examinations, most                 recent 02/17/2022. CHF and Cardiomyopathy; Risk                 Factors:Hypertension, Diabetes and Former Smoker. HFrEF, APML.  Sonographer:    Clayton Lefort RDCS (AE) Referring Phys: [place]  1. Left ventricular ejection fraction, by estimation, is 30 to 35%. The left ventricle has moderately decreased function. The left ventricle demonstrates global hypokinesis. The left ventricular internal cavity size was moderately dilated. Left ventricular diastolic parameters are indeterminate.  2. Right ventricular systolic function is mildly reduced. The  right ventricular size is mildly enlarged.  3. Left atrial size was mildly dilated.  4. The mitral valve is abnormal. Mild mitral valve regurgitation. No evidence of mitral stenosis.  5. The aortic valve is tricuspid. There is mild calcification of the aortic valve. There is mild thickening of the aortic valve. Aortic valve regurgitation is mild. Aortic valve sclerosis/calcification is present, without any evidence of aortic stenosis.  6. The inferior vena cava is dilated in size with >50% respiratory variability, suggesting right atrial pressure of 8 mmHg. FINDINGS  Left Ventricle: Left ventricular ejection fraction, by estimation, is 30 to 35%. The left ventricle has moderately decreased function. The left ventricle demonstrates global hypokinesis. The left ventricular internal cavity size was moderately dilated. There is no left ventricular hypertrophy. Left ventricular diastolic parameters are indeterminate. Right Ventricle: The right ventricular size is mildly enlarged. No increase in right ventricular wall thickness. Right ventricular systolic function is mildly reduced. Left Atrium: Left atrial size was mildly dilated. Right Atrium: Right atrial size was normal in size. Pericardium: Trivial pericardial effusion is present. The pericardial effusion is posterior to the left ventricle. Mitral Valve: The mitral valve is abnormal. There is mild thickening of the mitral valve leaflet(s). There is mild calcification of the mitral valve leaflet(s). Mild mitral valve regurgitation. No evidence of mitral valve stenosis. Tricuspid Valve: The  tricuspid valve is normal in structure. Tricuspid valve regurgitation is mild . No evidence of tricuspid stenosis. Aortic Valve: The aortic valve is tricuspid. There is mild calcification of the aortic valve. There is mild thickening of the aortic valve. Aortic valve regurgitation is mild. Aortic regurgitation PHT measures 185 msec. Aortic valve sclerosis/calcification is present, without any evidence of aortic stenosis. Aortic valve mean gradient measures 4.0 mmHg. Aortic valve peak gradient measures 6.2 mmHg. Aortic valve area, by VTI measures 2.33 cm. Pulmonic Valve: The pulmonic valve was normal in structure. Pulmonic valve regurgitation is mild. No evidence of pulmonic stenosis. Aorta: The aortic root is normal in size and structure. Venous: The inferior vena cava is dilated in size with greater than 50% respiratory variability, suggesting right atrial pressure of 8 mmHg. IAS/Shunts: No atrial level shunt detected by color flow Doppler.  LEFT VENTRICLE PLAX 2D LVIDd:         6.40 cm LVIDs:         5.70 cm LV PW:         0.80 cm LV IVS:        0.80 cm LVOT diam:     2.00 cm LV SV:         50 LV SV Index:   25 LVOT Area:     3.14 cm  LV Volumes (MOD) LV vol d, MOD A2C: 159.0 ml LV vol d, MOD A4C: 156.0 ml LV vol s, MOD A2C: 110.0 ml LV vol s, MOD A4C: 103.0 ml LV SV MOD A2C:     49.0 ml LV SV MOD A4C:     156.0 ml LV SV MOD BP:      53.0 ml RIGHT VENTRICLE             IVC RV Basal diam:  3.50 cm     IVC diam: 2.10 cm RV S prime:     10.40 cm/s TAPSE (M-mode): 1.9 cm LEFT ATRIUM             Index        RIGHT ATRIUM           Index  LA diam:        3.90 cm 1.95 cm/m   RA Area:     14.60 cm LA Vol (A2C):   64.5 ml 32.32 ml/m  RA Volume:   41.20 ml  20.65 ml/m LA Vol (A4C):   71.0 ml 35.58 ml/m LA Biplane Vol: 67.6 ml 33.88 ml/m  AORTIC VALVE AV Area (Vmax):    2.76 cm AV Area (Vmean):   2.48 cm AV Area (VTI):     2.33 cm AV Vmax:           125.00 cm/s AV Vmean:          91.300 cm/s AV VTI:             0.214 m AV Peak Grad:      6.2 mmHg AV Mean Grad:      4.0 mmHg LVOT Vmax:         110.00 cm/s LVOT Vmean:        72.100 cm/s LVOT VTI:          0.159 m LVOT/AV VTI ratio: 0.74 AI PHT:            185 msec  AORTA Ao Root diam: 3.60 cm Ao Asc diam:  3.60 cm  SHUNTS Systemic VTI:  0.16 m Systemic Diam: 2.00 cm Jenkins Rouge MD Electronically signed by Jenkins Rouge MD Signature Date/Time: 06/08/2022/4:36:03 PM    Final      Medications:     Current Medications:  aspirin EC  81 mg Oral q AM   atorvastatin  40 mg Oral Daily   azithromycin  500 mg Oral q1800   benzonatate  100 mg Oral TID   cholecalciferol  5,000 Units Oral q AM   enoxaparin (LOVENOX) injection  40 mg Subcutaneous Q24H   fluticasone  1 spray Each Nare Daily   furosemide  40 mg Oral Once   insulin aspart  0-15 Units Subcutaneous TID WC   [START ON 06/10/2022] insulin glargine-yfgn  6 Units Subcutaneous Daily   loratadine  10 mg Oral Daily   melatonin  5 mg Oral QHS   metoprolol succinate  12.5 mg Oral Daily   pantoprazole  40 mg Oral Daily   sacubitril-valsartan  1 tablet Oral BID   saline  1 Application Each Nare R4Y   sodium chloride flush  3 mL Intravenous Q12H   spironolactone  12.5 mg Oral Daily    Infusions:  sodium chloride     cefTRIAXone (ROCEPHIN)  IV 2 g (06/08/22 2239)   lactated ringers Stopped (06/07/22 0242)      Patient Profile   71 y/o male w/ chronic systolic HF, most recent EF ~45%, NICM, mod- nonobstructive 1VCAD by cath in 2020 (40% p RCA), Type 2DM and h/o APML (acute promyelocytic leukemia) completed treatment at [facility] in 2014, now in remission, w/ subsequent [place] cath related infection/ bacteremia and endocarditis in 2015, admitted w/ a/c CHF and suspected PNA. AHF team consulted given further drop in LVEF, now 30-35%, RV mildly reduced.   Assessment/Plan   1. Acute on Chronic Systolic Heart Failure - Had drop in EF in 2015, down to 25-30%, in setting of endocarditis. Also after completion  of chemo for APML, though CM not felt to be due to chemo per pt report  - EF improved, 35-40% in 2017 - EF 40% in 2020  - Gantt 1v CAD 40% pRCA>>NICM  - EF 50% in 2021 - EF 40% 7/23, now down further to  30-35%, RV mildly reduced. Denies ischemic CP  - etiology uncertain. Given known RCA disease on prior cath will restudy w/ [facility] tomorrow  - plan cMRI if cath unrevealing  - good response to IV Lasix, still w/ mild fluid overload. Continue IV diuretics today and assess filling pressures by [facility] tomorrow  - continue Entesto 26-26 mg bid. BP too soft for titration  - continue spironolactone 12.5 mg daily  - continue Toprol XL 12.5 mg daily  - would avoid SGLT2i given ketosis on admit  - may need digoxin if sinus tach persist   2. CAP  - Chest CT suggestive of PNA.  - PCT < 0.10, Respiratory panel negative - abx per primary team   3. CAD - mod- nonobstructive 1VCAD by cath in 2020 , 40% pRCA - denies CP, HS trop 19>>31 - given drop in EF, will plan LHC tomorrow  - continue ASA, statin + ? blocker  4. Type 2DM  - Hgb A1c 6.6  - Mild ketosis on admit. Primary team does not suspect DKA, likely due to acute illness and poor oral intake - Semglee dose decreased   5. APML - treated at [facility] in 2014 - in remission   6. H/o Endocarditis - 2015  - line associated bacteremia (Hickman cath for chemo) - treated w/ abx  Length of Stay: 2  Lyda Jester, PA-C  06/09/2022, 11:14 AM  Advanced Heart Failure Team Pager [phone] (M-F; 7a - 5p)  Please contact [facility] for night-coverage after hours (4p -7a ) and weekends on [email or web address]

## 2022-06-09 NOTE — Progress Notes (Signed)
Pt. Asleep this am. Unable to given scheduled meds and obtain morning VS. Oncoming RN aware.

## 2022-06-09 NOTE — Progress Notes (Signed)
Pt. C/o of insomnia during the night. States he hasn't slept since Thursday night. PRN Xanax given and ineffective. On call for [facility] paged to make aware. No new orders received.

## 2022-06-09 NOTE — Progress Notes (Signed)
PROGRESS NOTE    Ricardo Zuniga  UVO:[id number] DOB: [birth year] DOA: 06/06/2022 PCP: Glenda Chroman, MD    Ricardo Zuniga is a 71 y.o. male with medical history significant of Chronic systolic CHF EF 74%,, APML in remission, DM2, HTN. Pt with SOB for the past month Abruptly worsening in past couple of days with subjective fever at home.  Mild cough, has mild BLE Edema, no h/o DVT nor PE. -was scheduled for Coronary CTA just prior to this admission for DoE In ED, VSS, labs w/ mild leukocytosis, BNP 1624, CT Chest w/ Multifocal PNA   Subjective: -Still has dyspnea on exertion, upset about his echocardiogram, still reports sinus congestion  Assessment and Plan:  CAP (community acquired pneumonia) -Flu and COVID PCR, respiratory virus panel negative -Continue Rocephin and azithromycin -For sinus congestion continue Claritin, saline nasal spray   Acute on chronic systolic CHF -Last echo in July with EF of 45% -With progressive dyspnea on exertion over the last few months-was due to have an outpatient coronary CTA just prior to this admission, repeat echo with EF down to 30-35%, will request cardiology input -Clinically appears euvolemic now, stop IV Lasix today, asked to he is 2.8 L negative -Continue Aldactone, Entresto, beta-blocker -Mild metabolic acidosis on admission could have been related to [place], now on hold  History of nonobstructive CAD -was scheduled for coronary CTA just prior to admission, repeat echo with EF down to 30-35%, will request cards input -No ACS at this time -Continue aspirin, metoprolol and statin  Type 2 diabetes mellitus -Mild ketosis, do not suspect DKA, likely with acute illness and poor oral intake -Decrease Semglee dose -HbA1c is 6.6  Essential hypertension Continue entresto, aldactone, metoprolol  Hyperlipidemia Continue statin.   DVT prophylaxis: Lovenox Code Status: Full Code Family Communication: Patient and wife at bedside  yesterday Disposition Plan: Home likely 1 to 2 days  Consultants:    Procedures:   Antimicrobials:    Objective: Vitals:   06/08/22 1600 06/08/22 1947 06/08/22 2232 06/09/22 0844  BP: 104/66 126/76 100/70 102/63  Pulse: (!) 104 (!) 109 (!) 104 96  Resp: '18 18  18  '$ Temp: 97.9 F (36.6 C) 97.9 F (36.6 C) 98 F (36.7 C) 97.9 F (36.6 C)  TempSrc: Oral Oral Oral   SpO2: 96% 99% 96% 95%  Weight:    84.5 kg  Height:        Intake/Output Summary (Last 24 hours) at 06/09/2022 1004 Last data filed at 06/09/2022 0854 Gross per 24 hour  Intake 920.06 ml  Output 1625 ml  Net -704.94 ml   Filed Weights   06/07/22 2100 06/08/22 0424 06/09/22 0844  Weight: 85 kg 83.6 kg 84.5 kg    Examination:  General exam: Pleasant male sitting up in bed, AAOx3, no distress HEENT: No JVD CVS: S1-S2, regular rhythm Lungs: Rare basilar rales Abdomen: Soft, nontender, bowel sounds present Extremities: No edema  Neuro: Moves all extremities, no localizing signs Skin: No rashes Psychiatry:  Mood & affect appropriate.     Data Reviewed:   CBC: Recent Labs  Lab 06/06/22 1644 06/08/22 0041 06/09/22 0035  WBC 12.0* 8.2 6.1  HGB 14.9 13.2 12.8*  HCT 44.9 39.6 36.3*  MCV 95.1 92.3 90.1  PLT 243 216 259   Basic Metabolic Panel: Recent Labs  Lab 06/06/22 1644 06/07/22 0514 06/08/22 0041 06/09/22 0035  NA 138 136 135 136  K 4.3 3.7 3.9 3.9  CL 103 100 103 103  CO2  21* 23 20* 23  GLUCOSE 206* 116* 172* 145*  BUN '16 14 17 19  '$ CREATININE 1.17 1.03 0.85 1.02  CALCIUM 9.5 8.8* 9.2 9.0   GFR: Estimated Creatinine Clearance: 65.5 mL/min (by C-G formula based on SCr of 1.02 mg/dL). Liver Function Tests: No results for input(s): "AST", "ALT", "ALKPHOS", "BILITOT", "PROT", "ALBUMIN" in the last 168 hours. No results for input(s): "LIPASE", "AMYLASE" in the last 168 hours. No results for input(s): "AMMONIA" in the last 168 hours. Coagulation Profile: No results for input(s):  "INR", "PROTIME" in the last 168 hours. Cardiac Enzymes: No results for input(s): "CKTOTAL", "CKMB", "CKMBINDEX", "TROPONINI" in the last 168 hours. BNP (last 3 results) No results for input(s): "PROBNP" in the last 8760 hours. HbA1C: Recent Labs    06/07/22 0514  HGBA1C 6.6*   CBG: Recent Labs  Lab 06/08/22 0607 06/08/22 1110 06/08/22 1542 06/08/22 2131 06/09/22 0619  GLUCAP 152* 196* 264* 237* 130*   Lipid Profile: No results for input(s): "CHOL", "HDL", "LDLCALC", "TRIG", "CHOLHDL", "LDLDIRECT" in the last 72 hours. Thyroid Function Tests: No results for input(s): "TSH", "T4TOTAL", "FREET4", "T3FREE", "THYROIDAB" in the last 72 hours. Anemia Panel: No results for input(s): "VITAMINB12", "FOLATE", "FERRITIN", "TIBC", "IRON", "RETICCTPCT" in the last 72 hours. Urine analysis:    Component Value Date/Time   COLORURINE YELLOW 06/06/2022 [place] 06/06/2022 2254   LABSPEC 1.031 (H) 06/06/2022 2254   PHURINE 5.0 06/06/2022 2254   GLUCOSEU >=500 (A) 06/06/2022 2254   HGBUR SMALL (A) 06/06/2022 2254   BILIRUBINUR NEGATIVE 06/06/2022 2254   KETONESUR 80 (A) 06/06/2022 2254   PROTEINUR 30 (A) 06/06/2022 2254   NITRITE NEGATIVE 06/06/2022 2254   LEUKOCYTESUR NEGATIVE 06/06/2022 2254   Sepsis Labs: '@LABRCNTIP'$ (procalcitonin:4,lacticidven:4)  ) Recent Results (from the past 240 hour(s))  Respiratory (~20 pathogens) panel by PCR     Status: None   Collection Time: 06/07/22  9:43 AM   Specimen: Nasopharyngeal Swab; Respiratory  Result Value Ref Range Status   Adenovirus NOT DETECTED NOT DETECTED Final   Coronavirus 229E NOT DETECTED NOT DETECTED Final    Comment: (NOTE) The Coronavirus on the Respiratory Panel, DOES NOT test for the novel  Coronavirus (2019 nCoV)    Coronavirus HKU1 NOT DETECTED NOT DETECTED Final   Coronavirus NL63 NOT DETECTED NOT DETECTED Final   Coronavirus OC43 NOT DETECTED NOT DETECTED Final   Metapneumovirus NOT DETECTED NOT  DETECTED Final   Rhinovirus / Enterovirus NOT DETECTED NOT DETECTED Final   Influenza A NOT DETECTED NOT DETECTED Final   Influenza B NOT DETECTED NOT DETECTED Final   Parainfluenza Virus 1 NOT DETECTED NOT DETECTED Final   Parainfluenza Virus 2 NOT DETECTED NOT DETECTED Final   Parainfluenza Virus 3 NOT DETECTED NOT DETECTED Final   Parainfluenza Virus 4 NOT DETECTED NOT DETECTED Final   Respiratory Syncytial Virus NOT DETECTED NOT DETECTED Final   Bordetella pertussis NOT DETECTED NOT DETECTED Final   Bordetella Parapertussis NOT DETECTED NOT DETECTED Final   Chlamydophila pneumoniae NOT DETECTED NOT DETECTED Final   Mycoplasma pneumoniae NOT DETECTED NOT DETECTED Final    Comment: Performed at [facility], [place] [address] [place], [place] [address]     Radiology Studies: ECHOCARDIOGRAM COMPLETE  Result Date: 06/08/2022    ECHOCARDIOGRAM REPORT   Patient Name:   RIDGE LAFOND Date of Exam: 06/08/2022 Medical Rec #:  [id number]        Height:       69.0 in Accession #:  [id number]       Weight:       184.3 lb Date of Birth:  October 02, 1950        BSA:          1.995 m Patient Age:    74 years         BP:           105/70 mmHg Patient Gender: M                HR:           107 bpm. Exam Location:  Inpatient Procedure: 2D Echo, 3D Echo, Cardiac Doppler and Color Doppler Indications:    Dyspnea  History:        Patient has prior history of Echocardiogram examinations, most                 recent 02/17/2022. CHF and Cardiomyopathy; Risk                 Factors:Hypertension, Diabetes and Former Smoker. HFrEF, APML.  Sonographer:    Clayton Lefort RDCS (AE) Referring Phys: [place]  1. Left ventricular ejection fraction, by estimation, is 30 to 35%. The left ventricle has moderately decreased function. The left ventricle demonstrates global hypokinesis. The left ventricular internal cavity size was moderately dilated. Left ventricular diastolic parameters are indeterminate.  2.  Right ventricular systolic function is mildly reduced. The right ventricular size is mildly enlarged.  3. Left atrial size was mildly dilated.  4. The mitral valve is abnormal. Mild mitral valve regurgitation. No evidence of mitral stenosis.  5. The aortic valve is tricuspid. There is mild calcification of the aortic valve. There is mild thickening of the aortic valve. Aortic valve regurgitation is mild. Aortic valve sclerosis/calcification is present, without any evidence of aortic stenosis.  6. The inferior vena cava is dilated in size with >50% respiratory variability, suggesting right atrial pressure of 8 mmHg. FINDINGS  Left Ventricle: Left ventricular ejection fraction, by estimation, is 30 to 35%. The left ventricle has moderately decreased function. The left ventricle demonstrates global hypokinesis. The left ventricular internal cavity size was moderately dilated. There is no left ventricular hypertrophy. Left ventricular diastolic parameters are indeterminate. Right Ventricle: The right ventricular size is mildly enlarged. No increase in right ventricular wall thickness. Right ventricular systolic function is mildly reduced. Left Atrium: Left atrial size was mildly dilated. Right Atrium: Right atrial size was normal in size. Pericardium: Trivial pericardial effusion is present. The pericardial effusion is posterior to the left ventricle. Mitral Valve: The mitral valve is abnormal. There is mild thickening of the mitral valve leaflet(s). There is mild calcification of the mitral valve leaflet(s). Mild mitral valve regurgitation. No evidence of mitral valve stenosis. Tricuspid Valve: The tricuspid valve is normal in structure. Tricuspid valve regurgitation is mild . No evidence of tricuspid stenosis. Aortic Valve: The aortic valve is tricuspid. There is mild calcification of the aortic valve. There is mild thickening of the aortic valve. Aortic valve regurgitation is mild. Aortic regurgitation PHT measures  185 msec. Aortic valve sclerosis/calcification is present, without any evidence of aortic stenosis. Aortic valve mean gradient measures 4.0 mmHg. Aortic valve peak gradient measures 6.2 mmHg. Aortic valve area, by VTI measures 2.33 cm. Pulmonic Valve: The pulmonic valve was normal in structure. Pulmonic valve regurgitation is mild. No evidence of pulmonic stenosis. Aorta: The aortic root is normal in size and structure. Venous: The inferior vena cava is dilated in size with  greater than 50% respiratory variability, suggesting right atrial pressure of 8 mmHg. IAS/Shunts: No atrial level shunt detected by color flow Doppler.  LEFT VENTRICLE PLAX 2D LVIDd:         6.40 cm LVIDs:         5.70 cm LV PW:         0.80 cm LV IVS:        0.80 cm LVOT diam:     2.00 cm LV SV:         50 LV SV Index:   25 LVOT Area:     3.14 cm  LV Volumes (MOD) LV vol d, MOD A2C: 159.0 ml LV vol d, MOD A4C: 156.0 ml LV vol s, MOD A2C: 110.0 ml LV vol s, MOD A4C: 103.0 ml LV SV MOD A2C:     49.0 ml LV SV MOD A4C:     156.0 ml LV SV MOD BP:      53.0 ml RIGHT VENTRICLE             IVC RV Basal diam:  3.50 cm     IVC diam: 2.10 cm RV S prime:     10.40 cm/s TAPSE (M-mode): 1.9 cm LEFT ATRIUM             Index        RIGHT ATRIUM           Index LA diam:        3.90 cm 1.95 cm/m   RA Area:     14.60 cm LA Vol (A2C):   64.5 ml 32.32 ml/m  RA Volume:   41.20 ml  20.65 ml/m LA Vol (A4C):   71.0 ml 35.58 ml/m LA Biplane Vol: 67.6 ml 33.88 ml/m  AORTIC VALVE AV Area (Vmax):    2.76 cm AV Area (Vmean):   2.48 cm AV Area (VTI):     2.33 cm AV Vmax:           125.00 cm/s AV Vmean:          91.300 cm/s AV VTI:            0.214 m AV Peak Grad:      6.2 mmHg AV Mean Grad:      4.0 mmHg LVOT Vmax:         110.00 cm/s LVOT Vmean:        72.100 cm/s LVOT VTI:          0.159 m LVOT/AV VTI ratio: 0.74 AI PHT:            185 msec  AORTA Ao Root diam: 3.60 cm Ao Asc diam:  3.60 cm  SHUNTS Systemic VTI:  0.16 m Systemic Diam: 2.00 cm Jenkins Rouge MD  Electronically signed by Jenkins Rouge MD Signature Date/Time: 06/08/2022/4:36:03 PM    Final      Scheduled Meds:  aspirin EC  81 mg Oral q AM   atorvastatin  40 mg Oral Daily   azithromycin  500 mg Oral q1800   cholecalciferol  5,000 Units Oral q AM   enoxaparin (LOVENOX) injection  40 mg Subcutaneous Q24H   insulin aspart  0-15 Units Subcutaneous TID WC   insulin glargine-yfgn  15 Units Subcutaneous Daily   loratadine  10 mg Oral Daily   metoprolol succinate  12.5 mg Oral Daily   pantoprazole  40 mg Oral Daily   sacubitril-valsartan  1 tablet Oral BID   saline  1 Application Each Nare  Q6H   sodium chloride flush  3 mL Intravenous Q12H   spironolactone  12.5 mg Oral Daily   Continuous Infusions:  sodium chloride     cefTRIAXone (ROCEPHIN)  IV 2 g (06/08/22 2239)   lactated ringers Stopped (06/07/22 0242)     LOS: 2 days    Time spent: 36mn    PDomenic Polite MD [facility]   06/09/2022, 10:04 AM

## 2022-06-09 NOTE — Consult Note (Signed)
Advanced Heart Failure Team Consult Note   Primary Physician: Glenda Chroman, MD PCP-Cardiologist:  [name], MD  Reason for Consultation: acute on chronic systolic heart failure  HPI:    Ricardo Zuniga is seen today for evaluation of acute on chronic systolic heart failure at the request of Dr. Broadus John, Internal Medicine.    71 y/o male w/ chronic systolic heart failure due to NICM, T2DM,  H/o APML (acute promyelocytic leukemia) completed treatment at [facility] in 2014, now in remission. 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 He was treated w/ abx. Per patient and wife report, his hematologist did not think that his CM was chemo-induced, and felt mostly likely triggered by endocarditis.   Repeat echo in 2017 showed interval improvement,  LVEF up to 35-40%.   Had [place] in 2020 that showed mod nonobstructive one-vessel coronary artery disease, prox RCA 40% stenosed.   Echo 7/21 EF 50%, RV normal   Echo 7/23 EF mildly reduced, 45%.   Over the last month has struggled w/ exertional dyspnea and fatigue. No chest pain. Also bothered w/ head and nasal congestion. W/u in the ED revealed acute CHF. Imaging also suggestive of PNA, though PCT < 0.10. Respiratory panel negative. Chest CT negative for PE, + for mild-mod LUL and bibasilar atelectasis and/or early infiltrate, small rt pleural effusion, mild cardiomyopathy and mild coronary artery calcifications. BNP elevated at 1,624. Hs trop 19>>31.  Admitted by IM and started on abx and IV Lasix, w/ good urinary response. Still feels tired and SOB w/ exertion.   Echo completed this admit, shows further reduction in LVEF down to 30-35%, Global HK, mildly reduced RV and mild MR. AHF team asked to assess.    Other labs, SCr stable 1.02. CO2 23, K 3.9. Also w/ mild ketosis on admit. Glucose 206, beta- hydroxybutyric acid elevated at  0.74. UA + for ketone.   Hgb A1c 6.6 (down from 8.7)  Of note, pt also w/o polyneuropathy including carpal tunnel s/p CTR and spinal stenosis.   Echo 05/2012 EF 50%, RV normal  Echo 05/2014 EF 25-30%, RV mildly reduced Echo 05/2016 EF 35-40%, RV normal  Echo 08/2018 EF 40%, RV normal  Echo 7/21 EF 50%, RV normal  Echo 8/22 EF 40-45%, RV normal  Echo 7/23 EF 45%, RV normal  Echo 06/08/22 EF 30-35%, global HK, RV mildly reduced, mild MR   LHC 09/2018: Moderate nonobstructive one-vessel coronary artery disease, prox RCA 40% stenosed.    Review of Systems: [y] = yes, '[ ]'$  = no   General: Weight gain '[ ]'$ ; Weight loss '[ ]'$ ; Anorexia '[ ]'$ ; Fatigue [ Y]; Fever '[ ]'$ ; Chills '[ ]'$ ; Weakness '[ ]'$   Cardiac: Chest pain/pressure '[ ]'$ ; Resting SOB [Y ]; Exertional SOB [Y ]; Orthopnea '[ ]'$ ; Pedal Edema '[ ]'$ ; Palpitations '[ ]'$ ; Syncope '[ ]'$ ; Presyncope '[ ]'$ ; Paroxysmal nocturnal dyspnea'[ ]'$   Pulmonary: Cough '[ ]'$ ; Wheezing'[ ]'$ ; Hemoptysis'[ ]'$ ; Sputum '[ ]'$ ; Snoring '[ ]'$   GI: Vomiting'[ ]'$ ; Dysphagia'[ ]'$ ; Melena'[ ]'$ ; Hematochezia '[ ]'$ ; Heartburn'[ ]'$ ; Abdominal pain '[ ]'$ ; Constipation '[ ]'$ ; Diarrhea '[ ]'$ ; BRBPR '[ ]'$   GU: Hematuria'[ ]'$ ; Dysuria '[ ]'$ ; Nocturia'[ ]'$   Vascular: Pain in legs with walking '[ ]'$ ; Pain in feet with lying flat '[ ]'$ ; Non-healing sores '[ ]'$ ; Stroke '[ ]'$ ; TIA '[ ]'$ ; Slurred speech '[ ]'$ ;  Neuro:  Headaches'[ ]'$ ; Vertigo'[ ]'$ ; Seizures'[ ]'$ ; Paresthesias'[ ]'$ ;Blurred vision '[ ]'$ ; Diplopia '[ ]'$ ; Vision changes '[ ]'$   Ortho/Skin: Arthritis '[ ]'$ ; Joint pain '[ ]'$ ; Muscle pain '[ ]'$ ; Joint swelling '[ ]'$ ; Back Pain '[ ]'$ ; Rash '[ ]'$   Psych: Depression'[ ]'$ ; Anxiety'[ ]'$   Heme: Bleeding problems '[ ]'$ ; Clotting disorders '[ ]'$ ; Anemia '[ ]'$   Endocrine: Diabetes [Y ]; Thyroid dysfunction'[ ]'$   Home Medications Prior to Admission medications   Medication Sig Start Date End Date Taking? Authorizing Provider  acetaminophen (TYLENOL) 650 MG CR tablet Take 650-1,300 mg by mouth every 8 (eight) hours as needed for pain.   Yes [provider]  albuterol  (VENTOLIN HFA) 108 (90 Base) MCG/ACT inhaler Inhale 2 puffs into the lungs every 6 (six) hours as needed for wheezing or shortness of breath. 05/23/22  Yes Loel Dubonnet, NP  ALPRAZolam Duanne Moron) 0.25 MG tablet Take 0.25 mg by mouth at bedtime as needed for anxiety or sleep. 05/13/22  Yes [provider]  aspirin EC 81 MG tablet Take 81 mg by mouth in the morning.   Yes [provider]  atorvastatin (LIPITOR) 40 MG tablet Take 1 tablet (40 mg total) by mouth daily. 03/21/22  Yes Satira Sark, MD  Cholecalciferol (VITAMIN D3) 125 MCG (5000 UT) TABS Take 1 tablet by mouth in the morning.   Yes [provider]  diazepam (VALIUM) 5 MG tablet Take 5 mg by mouth daily as needed (vertigo). 10/17/21  Yes [provider]  ENTRESTO 24-26 MG Take 1 tablet by mouth twice daily 02/24/22  Yes Satira Sark, MD  furosemide (LASIX) 20 MG tablet Take 1 tablet (20 mg total) by mouth daily. 02/28/22  Yes Satira Sark, MD  furosemide (LASIX) 20 MG tablet Take 40 mg by mouth in the morning, and 20 mg by mouth in the evening, for 7 days 06/06/22  Yes Trifan, Carola Rhine, MD  guaiFENesin (MUCINEX) 600 MG 12 hr tablet Take 600 mg by mouth daily as needed (congestion.).   Yes [provider]  JARDIANCE 10 MG TABS tablet Take 10 mg by mouth every evening. 12/18/21  Yes [provider]  metoprolol succinate (TOPROL-XL) 25 MG 24 hr tablet Take 1 tablet (25 mg total) by mouth daily. Patient taking differently: Take 12.5 mg by mouth in the morning. 03/18/21  Yes Verta Ellen., NP  omeprazole (PRILOSEC) 20 MG capsule Take 20 mg by mouth daily before breakfast.   Yes [provider]  ondansetron (ZOFRAN) 4 MG tablet Take 1 tablet (4 mg total) by mouth every 6 (six) hours as needed for nausea. 11/03/21  Yes Sheikh, Omair Latif, DO  potassium chloride (KLOR-CON) 10 MEQ tablet Take 1 tablet (10 mEq total) by mouth daily for 30 doses. 06/06/22 07/06/22 Yes  Trifan, Carola Rhine, MD  repaglinide (PRANDIN) 2 MG tablet Take 2 mg by mouth 3 (three) times daily before meals.  07/06/17  Yes [provider]  spironolactone (ALDACTONE) 25 MG tablet Take 1/2 (one-half) tablet by mouth once daily 03/19/22  Yes Satira Sark, MD  zinc gluconate 50 MG tablet Take 50 mg by mouth in the morning.   Yes [provider]  methocarbamol (ROBAXIN) 500 MG tablet Take 1 tablet (500 mg total) by mouth every 6 (six) hours as needed for muscle spasms. Patient not taking: Reported on 06/07/2022 01/03/22   Kristeen Miss, MD  naproxen sodium (ALEVE) 220 MG tablet Take 220-440 mg by mouth daily as  needed (pain.).    [provider]    Past Medical History: Past Medical History:  Diagnosis Date   Anal fissure    APML (acute promyelocytic leukemia) in remission ([place])    Completed treatment 04/2013   Coronary artery disease    Nonobstructive at cardiac catheterization 09/2018   Difficult intubation    w/shoulder surgery at surgical center between 2005-2008   Gastroesophageal reflux disease    History of blood transfusion 2014   History of kidney stones    passed stones   Hypercholesteremia    Neuropathy    bilateral feet   Nonischemic cardiomyopathy ([place])    a. EF 20% in 2015 - thought to possibly be viral induced or chemotherapy induced from treatments in 2014 b. at 35-40% by echo in 05/2016    Peptic ulcer disease    Pneumonia    Yrs ago   Type 2 diabetes mellitus ([place])    Ureteral colic    Vertigo    tx with valium prn   Wears glasses    Wears hearing aid in both ears     Past Surgical History: Past Surgical History:  Procedure Laterality Date   ANAL FISSURE REPAIR     APPENDECTOMY     BIOPSY  08/17/2020   Procedure: BIOPSY;  Surgeon: Harvel Quale, MD;  [facility];  Service: Gastroenterology;;   Wilmon Pali RELEASE Left 10/16/2017   Procedure: LEFT CARPAL TUNNEL RELEASE;  Surgeon: Earlie Server, MD;   Location: [place];  Service: Orthopedics;  Laterality: Left;   CARPAL TUNNEL RELEASE Right 11/25/2019   Procedure: CARPAL TUNNEL RELEASE;  Surgeon: Earlie Server, MD;  Location: [facility];  Service: Orthopedics;  Laterality: Right;   CATARACT EXTRACTION, BILATERAL     CERVICAL DISC ARTHROPLASTY N/A 01/05/2018   Procedure: Artificial Cervical Disc ReplacementCervical Three-Four, Cervical Four-Five;  Surgeon: Kristeen Miss, MD;  Location: [place];  Service: Neurosurgery;  Laterality: N/A;   CERVICAL FUSION     CHOLECYSTECTOMY     COLONOSCOPY N/A 06/27/2015   Procedure: COLONOSCOPY;  Surgeon: Rogene Houston, MD;  [facility];  Service: Endoscopy;  Laterality: N/A;  730   COLONOSCOPY WITH PROPOFOL N/A 08/17/2020   Procedure: COLONOSCOPY WITH PROPOFOL;  Surgeon: Harvel Quale, MD;  [facility];  Service: Gastroenterology;  Laterality: N/A;  11:15   COLONOSCOPY WITH PROPOFOL N/A 10/23/2021   Procedure: COLONOSCOPY WITH PROPOFOL;  Surgeon: Harvel Quale, MD;  [facility];  Service: Gastroenterology;  Laterality: N/A;  9:45am   ESOPHAGOGASTRODUODENOSCOPY (EGD) WITH PROPOFOL N/A 08/17/2020   Procedure: ESOPHAGOGASTRODUODENOSCOPY (EGD) WITH PROPOFOL;  Surgeon: Harvel Quale, MD;  [facility];  Service: Gastroenterology;  Laterality: N/A;   KNEE SURGERY     x3   LEFT AND RIGHT HEART CATHETERIZATION WITH CORONARY ANGIOGRAM N/A 12/12/2013   Procedure: LEFT AND RIGHT HEART CATHETERIZATION WITH CORONARY ANGIOGRAM;  Surgeon: [name], MD;  Location: [facility] C[facility] LAB;  Service: Cardiovascular;  Laterality: N/A;   LEFT HEART CATH AND CORONARY ANGIOGRAPHY N/A 09/17/2018   Procedure: LEFT HEART CATH AND CORONARY ANGIOGRAPHY;  Surgeon: Wellington Hampshire, MD;  Location: [place] CV LAB;  Service: Cardiovascular;  Laterality: N/A;   LUMBAR FUSION     LUMBAR LAMINECTOMY/DECOMPRESSION MICRODISCECTOMY Bilateral 01/02/2022   Procedure: Bilateral  Lumbar Three-Four/Lumbar Four-Five Sublaminar decompression;  Surgeon: Kristeen Miss, MD;  Location: [place];  Service: Neurosurgery;  Laterality: Bilateral;  3C/RM 21 To Follow Dr Annette Stable  POLYPECTOMY  08/17/2020   Procedure: POLYPECTOMY;  Surgeon: Harvel Quale, MD;  [facility];  Service: Gastroenterology;;   POLYPECTOMY  10/23/2021   Procedure: POLYPECTOMY;  Surgeon: Harvel Quale, MD;  [facility];  Service: Gastroenterology;;   SHOULDER ACROMIOPLASTY Right 07/18/2015   Procedure: SHOULDER ACROMIOPLASTY;  Surgeon: Earlie Server, MD;  Location: [place];  Service: Orthopedics;  Laterality: Right;   SHOULDER ARTHROSCOPY Right 07/18/2015   Procedure: Right shoulder arthroscopy with debridement;  Surgeon: Earlie Server, MD;  Location: [place];  Service: Orthopedics;  Laterality: Right;   SHOULDER SURGERY     x3   TONSILLECTOMY      Family History: Family History  Problem Relation Age of Onset   Colon cancer Other     Social History: Social History   Socioeconomic History   Marital status: Married    Spouse name: Not on file   Number of children: Not on file   Years of education: Not on file   Highest education level: Not on file  Occupational History   Occupation: RETIRED    Comment: [employer]   Occupation: SECURITY GUARD  Tobacco Use   Smoking status: Former    Packs/day: 2.00    Years: 17.00    Total pack years: 34.00    Types: Cigarettes    Start date: 02/17/1968    Quit date: 02/28/1986    Years since quitting: 36.3   Smokeless tobacco: Never  Vaping Use   Vaping Use: Never used  Substance and Sexual Activity   Alcohol use: No    Alcohol/week: 0.0 standard drinks of alcohol   Drug use: No   Sexual activity: Not Currently  Other Topics Concern   Not on file  Social History Narrative   Not on file   Social Determinants of Health   Financial Resource Strain: Not on file  Food  Insecurity: No Food Insecurity (06/07/2022)   Hunger Vital Sign    Worried About Running Out of Food in the Last Year: Never true    [place] in the Last Year: Never true  Transportation Needs: No Transportation Needs (06/07/2022)   PRAPARE - Hydrologist (Medical): No    Lack of Transportation (Non-Medical): No  Physical Activity: Not on file  Stress: Not on file  Social Connections: Not on file    Allergies:  Allergies  Allergen Reactions   Flonase [Fluticasone]    Lisinopril Cough    Objective:    Vital Signs:   Temp:  [97.9 F (36.6 C)-98 F (36.7 C)] 97.9 F (36.6 C) (10/30 0844) Pulse Rate:  [96-109] 96 (10/30 0844) Resp:  [18] 18 (10/30 0844) BP: (100-126)/(63-76) 102/63 (10/30 0844) SpO2:  [95 %-99 %] 95 % (10/30 0844) Weight:  [84.5 kg] 84.5 kg (10/30 0844) Last BM Date : 06/08/22  Weight change: Filed Weights   06/07/22 2100 06/08/22 0424 06/09/22 0844  Weight: 85 kg 83.6 kg 84.5 kg    Intake/Output:   Intake/Output Summary (Last 24 hours) at 06/09/2022 1114 Last data filed at 06/09/2022 0854 Gross per 24 hour  Intake 920.06 ml  Output 1625 ml  Net -704.94 ml      Physical Exam    General:  Well appearing. No resp difficulty HEENT: normal Neck: supple. JVP . Carotids 2+ bilat; no bruits. No lymphadenopathy or thyromegaly appreciated. Cor: PMI nondisplaced. Regular rate & rhythm. No rubs, gallops or murmurs.  Lungs: clear Abdomen: soft, nontender, nondistended. No hepatosplenomegaly. No bruits or masses. Good bowel sounds. Extremities: no cyanosis, clubbing, rash, edema Neuro: alert & orientedx3, cranial nerves grossly intact. moves all 4 extremities w/o difficulty. Affect pleasant   Telemetry   Sinus tach w/ PACs low 100s   EKG    Sinus tach 119 bpm   Labs   Basic Metabolic Panel: Recent Labs  Lab 06/06/22 1644 06/07/22 0514 06/08/22 0041 06/09/22 0035  NA 138 136 135 136  K 4.3 3.7 3.9 3.9   CL 103 100 103 103  CO2 21* 23 20* 23  GLUCOSE 206* 116* 172* 145*  BUN '16 14 17 19  '$ CREATININE 1.17 1.03 0.85 1.02  CALCIUM 9.5 8.8* 9.2 9.0    Liver Function Tests: No results for input(s): "AST", "ALT", "ALKPHOS", "BILITOT", "PROT", "ALBUMIN" in the last 168 hours. No results for input(s): "LIPASE", "AMYLASE" in the last 168 hours. No results for input(s): "AMMONIA" in the last 168 hours.  CBC: Recent Labs  Lab 06/06/22 1644 06/08/22 0041 06/09/22 0035  WBC 12.0* 8.2 6.1  HGB 14.9 13.2 12.8*  HCT 44.9 39.6 36.3*  MCV 95.1 92.3 90.1  PLT 243 216 230    Cardiac Enzymes: No results for input(s): "CKTOTAL", "CKMB", "CKMBINDEX", "TROPONINI" in the last 168 hours.  BNP: BNP (last 3 results) Recent Labs    10/13/21 0920 06/06/22 1649  BNP 137.0* 1,624.1*    ProBNP (last 3 results) No results for input(s): "PROBNP" in the last 8760 hours.   CBG: Recent Labs  Lab 06/08/22 1110 06/08/22 1542 06/08/22 2131 06/09/22 0619 06/09/22 1111  GLUCAP 196* 264* 237* 130* 173*    Coagulation Studies: No results for input(s): "LABPROT", "INR" in the last 72 hours.   Imaging   ECHOCARDIOGRAM COMPLETE  Result Date: 06/08/2022    ECHOCARDIOGRAM REPORT   Patient Name:   Ricardo Zuniga Date of Exam: 06/08/2022 Medical Rec #:  [id number]        Height:       69.0 in Accession #:    [id number]       Weight:       184.3 lb Date of Birth:  Sep 22, 1950        BSA:          1.995 m Patient Age:    76 years         BP:           105/70 mmHg Patient Gender: M                HR:           107 bpm. Exam Location:  Inpatient Procedure: 2D Echo, 3D Echo, Cardiac Doppler and Color Doppler Indications:    Dyspnea  History:        Patient has prior history of Echocardiogram examinations, most                 recent 02/17/2022. CHF and Cardiomyopathy; Risk                 Factors:Hypertension, Diabetes and Former Smoker. HFrEF, APML.  Sonographer:    Clayton Lefort RDCS (AE) Referring Phys: [place]  1. Left ventricular ejection fraction, by estimation, is 30 to 35%. The left ventricle has moderately decreased function. The left ventricle demonstrates global hypokinesis. The left ventricular internal cavity size was moderately dilated. Left ventricular diastolic parameters are indeterminate.  2. Right ventricular systolic function is mildly reduced. The  right ventricular size is mildly enlarged.  3. Left atrial size was mildly dilated.  4. The mitral valve is abnormal. Mild mitral valve regurgitation. No evidence of mitral stenosis.  5. The aortic valve is tricuspid. There is mild calcification of the aortic valve. There is mild thickening of the aortic valve. Aortic valve regurgitation is mild. Aortic valve sclerosis/calcification is present, without any evidence of aortic stenosis.  6. The inferior vena cava is dilated in size with >50% respiratory variability, suggesting right atrial pressure of 8 mmHg. FINDINGS  Left Ventricle: Left ventricular ejection fraction, by estimation, is 30 to 35%. The left ventricle has moderately decreased function. The left ventricle demonstrates global hypokinesis. The left ventricular internal cavity size was moderately dilated. There is no left ventricular hypertrophy. Left ventricular diastolic parameters are indeterminate. Right Ventricle: The right ventricular size is mildly enlarged. No increase in right ventricular wall thickness. Right ventricular systolic function is mildly reduced. Left Atrium: Left atrial size was mildly dilated. Right Atrium: Right atrial size was normal in size. Pericardium: Trivial pericardial effusion is present. The pericardial effusion is posterior to the left ventricle. Mitral Valve: The mitral valve is abnormal. There is mild thickening of the mitral valve leaflet(s). There is mild calcification of the mitral valve leaflet(s). Mild mitral valve regurgitation. No evidence of mitral valve stenosis. Tricuspid Valve: The  tricuspid valve is normal in structure. Tricuspid valve regurgitation is mild . No evidence of tricuspid stenosis. Aortic Valve: The aortic valve is tricuspid. There is mild calcification of the aortic valve. There is mild thickening of the aortic valve. Aortic valve regurgitation is mild. Aortic regurgitation PHT measures 185 msec. Aortic valve sclerosis/calcification is present, without any evidence of aortic stenosis. Aortic valve mean gradient measures 4.0 mmHg. Aortic valve peak gradient measures 6.2 mmHg. Aortic valve area, by VTI measures 2.33 cm. Pulmonic Valve: The pulmonic valve was normal in structure. Pulmonic valve regurgitation is mild. No evidence of pulmonic stenosis. Aorta: The aortic root is normal in size and structure. Venous: The inferior vena cava is dilated in size with greater than 50% respiratory variability, suggesting right atrial pressure of 8 mmHg. IAS/Shunts: No atrial level shunt detected by color flow Doppler.  LEFT VENTRICLE PLAX 2D LVIDd:         6.40 cm LVIDs:         5.70 cm LV PW:         0.80 cm LV IVS:        0.80 cm LVOT diam:     2.00 cm LV SV:         50 LV SV Index:   25 LVOT Area:     3.14 cm  LV Volumes (MOD) LV vol d, MOD A2C: 159.0 ml LV vol d, MOD A4C: 156.0 ml LV vol s, MOD A2C: 110.0 ml LV vol s, MOD A4C: 103.0 ml LV SV MOD A2C:     49.0 ml LV SV MOD A4C:     156.0 ml LV SV MOD BP:      53.0 ml RIGHT VENTRICLE             IVC RV Basal diam:  3.50 cm     IVC diam: 2.10 cm RV S prime:     10.40 cm/s TAPSE (M-mode): 1.9 cm LEFT ATRIUM             Index        RIGHT ATRIUM           Index  LA diam:        3.90 cm 1.95 cm/m   RA Area:     14.60 cm LA Vol (A2C):   64.5 ml 32.32 ml/m  RA Volume:   41.20 ml  20.65 ml/m LA Vol (A4C):   71.0 ml 35.58 ml/m LA Biplane Vol: 67.6 ml 33.88 ml/m  AORTIC VALVE AV Area (Vmax):    2.76 cm AV Area (Vmean):   2.48 cm AV Area (VTI):     2.33 cm AV Vmax:           125.00 cm/s AV Vmean:          91.300 cm/s AV VTI:             0.214 m AV Peak Grad:      6.2 mmHg AV Mean Grad:      4.0 mmHg LVOT Vmax:         110.00 cm/s LVOT Vmean:        72.100 cm/s LVOT VTI:          0.159 m LVOT/AV VTI ratio: 0.74 AI PHT:            185 msec  AORTA Ao Root diam: 3.60 cm Ao Asc diam:  3.60 cm  SHUNTS Systemic VTI:  0.16 m Systemic Diam: 2.00 cm Jenkins Rouge MD Electronically signed by Jenkins Rouge MD Signature Date/Time: 06/08/2022/4:36:03 PM    Final      Medications:     Current Medications:  aspirin EC  81 mg Oral q AM   atorvastatin  40 mg Oral Daily   azithromycin  500 mg Oral q1800   benzonatate  100 mg Oral TID   cholecalciferol  5,000 Units Oral q AM   enoxaparin (LOVENOX) injection  40 mg Subcutaneous Q24H   fluticasone  1 spray Each Nare Daily   furosemide  40 mg Oral Once   insulin aspart  0-15 Units Subcutaneous TID WC   [START ON 06/10/2022] insulin glargine-yfgn  6 Units Subcutaneous Daily   loratadine  10 mg Oral Daily   melatonin  5 mg Oral QHS   metoprolol succinate  12.5 mg Oral Daily   pantoprazole  40 mg Oral Daily   sacubitril-valsartan  1 tablet Oral BID   saline  1 Application Each Nare I6N   sodium chloride flush  3 mL Intravenous Q12H   spironolactone  12.5 mg Oral Daily    Infusions:  sodium chloride     cefTRIAXone (ROCEPHIN)  IV 2 g (06/08/22 2239)   lactated ringers Stopped (06/07/22 0242)      Patient Profile   71 y/o male w/ chronic systolic HF, most recent EF ~45%, NICM, mod- nonobstructive 1VCAD by cath in 2020 (40% p RCA), Type 2DM and h/o APML (acute promyelocytic leukemia) completed treatment at [facility] in 2014, now in remission, w/ subsequent Hickman cath related infection/ bacteremia and endocarditis in 2015, admitted w/ a/c CHF and suspected PNA. AHF team consulted given further drop in LVEF, now 30-35%, RV mildly reduced.   Assessment/Plan   1. Acute on Chronic Systolic Heart Failure - Had drop in EF in 2015, down to 25-30%, in setting of endocarditis. Also after completion  of chemo for APML, though CM not felt to be due to chemo per pt report  - EF improved, 35-40% in 2017 - EF 40% in 2020  - [place] 1v CAD 40% pRCA>>NICM  - EF 50% in 2021 - EF 40% 7/23, now down further to  30-35%, RV mildly reduced. Denies ischemic CP  - etiology uncertain. Given known RCA disease on prior cath will restudy w/ [facility] tomorrow  - plan cMRI if cath unrevealing  - good response to IV Lasix, still w/ mild fluid overload. Continue IV diuretics today and assess filling pressures by RHC tomorrow  - continue Entesto 26-26 mg bid. BP too soft for titration  - continue spironolactone 12.5 mg daily  - continue Toprol XL 12.5 mg daily  - would avoid SGLT2i given ketosis on admit  - may need digoxin if sinus tach persist   2. CAP  - Chest CT suggestive of PNA.  - PCT < 0.10, Respiratory panel negative - abx per primary team   3. CAD - mod- nonobstructive 1VCAD by cath in 2020 , 40% pRCA - denies CP, HS trop 19>>31 - given drop in EF, will plan LHC tomorrow  - continue ASA, statin + ? blocker  4. Type 2DM  - Hgb A1c 6.6  - Mild ketosis on admit. Primary team does not suspect DKA, likely due to acute illness and poor oral intake - Semglee dose decreased   5. APML - treated at [facility] in 2014 - in remission   6. H/o Endocarditis - 2015  - line associated bacteremia (Hickman cath for chemo) - treated w/ abx  Length of Stay: 2  Lyda Jester, PA-C  06/09/2022, 11:14 AM  Advanced Heart Failure Team Pager [phone] (M-F; 7a - 5p)  Please contact [facility] for night-coverage after hours (4p -7a ) and weekends on [email or web address]

## 2022-06-09 NOTE — Progress Notes (Signed)
Consent form for cardiac cath signed and placed in chart.

## 2022-06-09 NOTE — Progress Notes (Signed)
Mobility Specialist Progress Note:   06/09/22 1145  Mobility  Activity Ambulated with assistance in hallway  Level of Assistance Independent  Assistive Device None  Distance Ambulated (ft) 450 ft  Activity Response Tolerated well  $Mobility charge 1 Mobility   Pt received in chair willing to participate in mobility. No complaints of pain. Left in chair with call bell in reach and all needs met.   [facility] Surveyor, mining Chat only

## 2022-06-09 NOTE — Progress Notes (Signed)
71 y.o. male with medical history significant of Chronic systolic CHF EF 29%,, APML in remission, DM2, HTN. Pt with SOB for the past month Abruptly worsening in past couple of days with subjective fever at home.  Mild cough, has mild BLE Edema, no h/o DVT nor PE. CAP, CHF, CAD. On IV abx. TOC following to assist with potential DC needs.

## 2022-06-10 ENCOUNTER — Encounter (HOSPITAL_COMMUNITY): Payer: Self-pay | Admitting: Cardiology

## 2022-06-10 ENCOUNTER — Encounter (HOSPITAL_COMMUNITY)
Admission: EM | Disposition: A | Discharge status: Home / Self Care | Payer: Self-pay | Source: Home / Self Care | Attending: Internal Medicine

## 2022-06-10 DIAGNOSIS — I5023 Acute on chronic systolic (congestive) heart failure: Secondary | ICD-10-CM | POA: Diagnosis not present

## 2022-06-10 DIAGNOSIS — I509 Heart failure, unspecified: Secondary | ICD-10-CM | POA: Diagnosis not present

## 2022-06-10 DIAGNOSIS — J189 Pneumonia, unspecified organism: Secondary | ICD-10-CM | POA: Diagnosis not present

## 2022-06-10 HISTORY — PX: RIGHT/LEFT HEART CATH AND CORONARY ANGIOGRAPHY: CATH118266

## 2022-06-10 LAB — POCT I-STAT EG7
Acid-base deficit: 1 mmol/L (ref 0.0–2.0)
Acid-base deficit: 2 mmol/L (ref 0.0–2.0)
Bicarbonate: 23.9 mmol/L (ref 20.0–28.0)
Bicarbonate: 24.2 mmol/L (ref 20.0–28.0)
Calcium, Ion: 1.28 mmol/L (ref 1.15–1.40)
Calcium, Ion: 1.3 mmol/L (ref 1.15–1.40)
HCT: 36 % — ABNORMAL LOW (ref 39.0–52.0)
HCT: 36 % — ABNORMAL LOW (ref 39.0–52.0)
Hemoglobin: 12.2 g/dL — ABNORMAL LOW (ref 13.0–17.0)
Hemoglobin: 12.2 g/dL — ABNORMAL LOW (ref 13.0–17.0)
O2 Saturation: 60 %
O2 Saturation: 60 %
Potassium: 3.9 mmol/L (ref 3.5–5.1)
Potassium: 4 mmol/L (ref 3.5–5.1)
Sodium: 135 mmol/L (ref 135–145)
Sodium: 137 mmol/L (ref 135–145)
TCO2: 25 mmol/L (ref 22–32)
TCO2: 25 mmol/L (ref 22–32)
pCO2, Ven: 43.5 mmHg — ABNORMAL LOW (ref 44–60)
pCO2, Ven: 44.4 mmHg (ref 44–60)
pH, Ven: 7.338 (ref 7.25–7.43)
pH, Ven: 7.353 (ref 7.25–7.43)
pO2, Ven: 33 mmHg (ref 32–45)
pO2, Ven: 33 mmHg (ref 32–45)

## 2022-06-10 LAB — GLUCOSE, CAPILLARY
Glucose-Capillary: 147 mg/dL — ABNORMAL HIGH (ref 70–99)
Glucose-Capillary: 180 mg/dL — ABNORMAL HIGH (ref 70–99)

## 2022-06-10 LAB — BASIC METABOLIC PANEL
Anion gap: 12 (ref 5–15)
BUN: 16 mg/dL (ref 8–23)
CO2: 22 mmol/L (ref 22–32)
Calcium: 9.5 mg/dL (ref 8.9–10.3)
Chloride: 103 mmol/L (ref 98–111)
Creatinine, Ser: 1.04 mg/dL (ref 0.61–1.24)
GFR, Estimated: >60 mL/min (ref >60)
Glucose, Bld: 156 mg/dL — ABNORMAL HIGH (ref 70–99)
Potassium: 4.4 mmol/L (ref 3.5–5.1)
Sodium: 137 mmol/L (ref 135–145)

## 2022-06-10 LAB — LIPID PANEL
Cholesterol: 79 mg/dL (ref 0–200)
HDL: 27 mg/dL — ABNORMAL LOW (ref >40)
LDL Cholesterol: 40 mg/dL (ref 0–99)
Total CHOL/HDL Ratio: 2.9 RATIO
Triglycerides: 60 mg/dL (ref <150)
VLDL: 12 mg/dL (ref 0–40)

## 2022-06-10 SURGERY — RIGHT/LEFT HEART CATH AND CORONARY ANGIOGRAPHY
Anesthesia: LOCAL

## 2022-06-10 MED ORDER — ACETAMINOPHEN 325 MG PO TABS: 650.0000 mg | ORAL_TABLET | ORAL | Status: DC | PRN

## 2022-06-10 MED ORDER — HYDRALAZINE HCL 20 MG/ML IJ SOLN: 10.0000 mg | INTRAMUSCULAR | Status: AC | PRN

## 2022-06-10 MED ORDER — HEPARIN SODIUM (PORCINE) 1000 UNIT/ML IJ SOLN
INTRAMUSCULAR | Status: AC
  Filled 2022-06-10: qty 10

## 2022-06-10 MED ORDER — SODIUM CHLORIDE 0.9 % IV SOLN: INTRAVENOUS | Status: DC

## 2022-06-10 MED ORDER — HEPARIN (PORCINE) IN NACL 1000-0.9 UT/500ML-% IV SOLN
INTRAVENOUS | Status: DC | PRN
  Administered 2022-06-10 (×2): 500 mL

## 2022-06-10 MED ORDER — CEFDINIR 300 MG PO CAPS
300.0000 mg | ORAL_CAPSULE | Freq: Two times a day (BID) | ORAL | Status: DC
  Administered 2022-06-10: 300 mg via ORAL
  Filled 2022-06-10: qty 1

## 2022-06-10 MED ORDER — IOHEXOL 350 MG/ML SOLN
INTRAVENOUS | Status: DC | PRN
  Administered 2022-06-10: 60 mL

## 2022-06-10 MED ORDER — CEFDINIR 300 MG PO CAPS: 300.0000 mg | ORAL_CAPSULE | Freq: Two times a day (BID) | ORAL | 0 refills | Status: AC

## 2022-06-10 MED ORDER — MIDAZOLAM HCL 2 MG/2ML IJ SOLN
INTRAMUSCULAR | Status: DC | PRN
  Administered 2022-06-10: 1 mg via INTRAVENOUS

## 2022-06-10 MED ORDER — VERAPAMIL HCL 2.5 MG/ML IV SOLN
INTRAVENOUS | Status: DC | PRN
  Administered 2022-06-10: 10 mL via INTRA_ARTERIAL

## 2022-06-10 MED ORDER — SODIUM CHLORIDE 0.9 % IV SOLN: 250.0000 mL | INTRAVENOUS | Status: DC | PRN

## 2022-06-10 MED ORDER — MIDAZOLAM HCL 2 MG/2ML IJ SOLN
INTRAMUSCULAR | Status: AC
  Filled 2022-06-10: qty 2

## 2022-06-10 MED ORDER — ENOXAPARIN SODIUM 40 MG/0.4ML IJ SOSY: 40.0000 mg | PREFILLED_SYRINGE | Freq: Every day | INTRAMUSCULAR | Status: DC

## 2022-06-10 MED ORDER — ASPIRIN 81 MG PO CHEW: 81.0000 mg | CHEWABLE_TABLET | ORAL | Status: DC

## 2022-06-10 MED ORDER — LIDOCAINE HCL (PF) 1 % IJ SOLN
INTRAMUSCULAR | Status: AC
  Filled 2022-06-10: qty 30

## 2022-06-10 MED ORDER — LIDOCAINE HCL (PF) 1 % IJ SOLN
INTRAMUSCULAR | Status: DC | PRN
  Administered 2022-06-10: 2 mL
  Administered 2022-06-10: 5 mL

## 2022-06-10 MED ORDER — SODIUM CHLORIDE 0.9% FLUSH: 3.0000 mL | INTRAVENOUS | Status: DC | PRN

## 2022-06-10 MED ORDER — FUROSEMIDE 40 MG PO TABS
40.0000 mg | ORAL_TABLET | Freq: Once | ORAL | Status: AC
  Administered 2022-06-10: 40 mg via ORAL
  Filled 2022-06-10: qty 1

## 2022-06-10 MED ORDER — FENTANYL CITRATE (PF) 100 MCG/2ML IJ SOLN
INTRAMUSCULAR | Status: AC
  Filled 2022-06-10: qty 2

## 2022-06-10 MED ORDER — HEPARIN (PORCINE) IN NACL 1000-0.9 UT/500ML-% IV SOLN
INTRAVENOUS | Status: AC
  Filled 2022-06-10: qty 1000

## 2022-06-10 MED ORDER — FENTANYL CITRATE (PF) 100 MCG/2ML IJ SOLN
INTRAMUSCULAR | Status: DC | PRN
  Administered 2022-06-10: 25 ug via INTRAVENOUS

## 2022-06-10 MED ORDER — LABETALOL HCL 5 MG/ML IV SOLN: 10.0000 mg | INTRAVENOUS | Status: AC | PRN

## 2022-06-10 MED ORDER — METOPROLOL SUCCINATE ER 25 MG PO TB24
25.0000 mg | ORAL_TABLET | Freq: Every day | ORAL | Status: DC
  Administered 2022-06-10: 25 mg via ORAL
  Filled 2022-06-10: qty 1

## 2022-06-10 MED ORDER — SODIUM CHLORIDE 0.9% FLUSH
3.0000 mL | Freq: Two times a day (BID) | INTRAVENOUS | Status: DC
  Administered 2022-06-10: 3 mL via INTRAVENOUS

## 2022-06-10 MED ORDER — FUROSEMIDE 20 MG PO TABS: 20.0000 mg | ORAL_TABLET | Freq: Every day | ORAL | Status: DC

## 2022-06-10 MED ORDER — ONDANSETRON HCL 4 MG/2ML IJ SOLN: 4.0000 mg | Freq: Four times a day (QID) | INTRAMUSCULAR | Status: DC | PRN

## 2022-06-10 MED ORDER — VERAPAMIL HCL 2.5 MG/ML IV SOLN
INTRAVENOUS | Status: AC
  Filled 2022-06-10: qty 2

## 2022-06-10 MED ORDER — HEPARIN SODIUM (PORCINE) 1000 UNIT/ML IJ SOLN
INTRAMUSCULAR | Status: DC | PRN
  Administered 2022-06-10: 4000 [IU] via INTRAVENOUS

## 2022-06-10 SURGICAL SUPPLY — 10 items
BAND ZEPHYR COMPRESS 30 LONG (HEMOSTASIS) IMPLANT
CATH OPTITORQUE TIG 4.0 5F (CATHETERS) IMPLANT
CATH SWAN GANZ 7F STRAIGHT (CATHETERS) IMPLANT
GLIDESHEATH SLEND A-KIT 6F 22G (SHEATH) IMPLANT
GUIDEWIRE INQWIRE 1.5J.035X260 (WIRE) IMPLANT
INQWIRE 1.5J .035X260CM (WIRE) ×1
KIT MICROPUNCTURE NIT STIFF (SHEATH) IMPLANT
PACK CARDIAC CATHETERIZATION (CUSTOM PROCEDURE TRAY) ×1 IMPLANT
SHEATH PINNACLE 7F 10CM (SHEATH) IMPLANT
TRANSDUCER W/STOPCOCK (MISCELLANEOUS) ×1 IMPLANT

## 2022-06-10 NOTE — Progress Notes (Signed)
PROGRESS NOTE    Ricardo Zuniga  PXT:[id number] DOB: 1950-10-26 DOA: 06/06/2022 PCP: Glenda Chroman, MD    Ricardo Zuniga is a 71 y.o. male with medical history significant of Chronic systolic CHF EF 62%,, APML in remission, DM2, HTN. Pt with SOB for the past month Abruptly worsening in past couple of days with subjective fever at home.  Mild cough, has mild BLE Edema, no h/o DVT nor PE. -was scheduled for Coronary CTA just prior to this admission for DoE In ED, VSS, labs w/ mild leukocytosis, BNP 1624, CT Chest w/ Multifocal PNA   Subjective: -Feels better, breathing has improved overall, cath today  Assessment and Plan:  CAP (community acquired pneumonia) -Flu and COVID PCR, respiratory virus panel negative -Continue Rocephin and azithromycin, changed to p.o. antibiotics -For sinus congestion continue Claritin, saline nasal spray   Acute on chronic systolic CHF -Last echo in July with EF of 45% -With progressive dyspnea on exertion over the last few months-was due to have an outpatient coronary CTA just prior to this admission, repeat echo with EF down to 30-35%, will request cardiology input -Clinically appears euvolemic now, he is 4 L negative, diuretics pending right heart cath -Continue Aldactone, Entresto, beta-blocker -Mild metabolic acidosis on admission could have been related to [place], now on hold  History of nonobstructive CAD -was scheduled for coronary CTA just prior to admission, repeat echo with EF down to 30-35%, will request cards input -No ACS at this time -Continue aspirin, metoprolol and statin  Type 2 diabetes mellitus -Mild ketosis, do not suspect DKA, likely with acute illness and poor oral intake -Decrease Semglee dose -HbA1c is 6.6  Essential hypertension Continue entresto, aldactone, metoprolol  Hyperlipidemia Continue statin.   DVT prophylaxis: Lovenox Code Status: Full Code Family Communication: None present Disposition Plan: Home  later today or in a.m.  Consultants:    Procedures:   Antimicrobials:    Objective: Vitals:   06/10/22 0813 06/10/22 0818 06/10/22 0823 06/10/22 0839  BP: 102/64 1'03/67 99/69 96/64 '$  Pulse: 95 93 93 95  Resp: (!) 24 (!) 21 (!) 30 20  Temp:    99.4 F (37.4 C)  TempSrc:   Oral Oral  SpO2: 96% 90% (!) 87% 95%  Weight:      Height:        Intake/Output Summary (Last 24 hours) at 06/10/2022 0948 Last data filed at 06/10/2022 7035 Gross per 24 hour  Intake 576.94 ml  Output 1600 ml  Net -1023.06 ml   Filed Weights   06/08/22 0424 06/09/22 0844 06/10/22 0533  Weight: 83.6 kg 84.5 kg 84.6 kg    Examination:  General exam: Pleasant male sitting up in bed, AAOx3, no distress HEENT: No JVD CVS: S1-S2, regular rhythm Lungs: Rare basilar rales Abdomen: Soft, nontender, bowel sounds present Extremities: No edema  Neuro: Moves all extremities, no localizing signs Skin: No rashes Psychiatry:  Mood & affect appropriate.     Data Reviewed:   CBC: Recent Labs  Lab 06/06/22 1644 06/08/22 0041 06/09/22 0035  WBC 12.0* 8.2 6.1  HGB 14.9 13.2 12.8*  HCT 44.9 39.6 36.3*  MCV 95.1 92.3 90.1  PLT 243 216 009   Basic Metabolic Panel: Recent Labs  Lab 06/06/22 1644 06/07/22 0514 06/08/22 0041 06/09/22 0035 06/10/22 0550  NA 138 136 135 136 137  K 4.3 3.7 3.9 3.9 4.4  CL 103 100 103 103 103  CO2 21* 23 20* 23 22  GLUCOSE 206* 116* 172*  145* 156*  BUN '16 14 17 19 16  '$ CREATININE 1.17 1.03 0.85 1.02 1.04  CALCIUM 9.5 8.8* 9.2 9.0 9.5   GFR: Estimated Creatinine Clearance: 64.2 mL/min (by C-G formula based on SCr of 1.04 mg/dL). Liver Function Tests: No results for input(s): "AST", "ALT", "ALKPHOS", "BILITOT", "PROT", "ALBUMIN" in the last 168 hours. No results for input(s): "LIPASE", "AMYLASE" in the last 168 hours. No results for input(s): "AMMONIA" in the last 168 hours. Coagulation Profile: No results for input(s): "INR", "PROTIME" in the last 168  hours. Cardiac Enzymes: No results for input(s): "CKTOTAL", "CKMB", "CKMBINDEX", "TROPONINI" in the last 168 hours. BNP (last 3 results) No results for input(s): "PROBNP" in the last 8760 hours. HbA1C: No results for input(s): "HGBA1C" in the last 72 hours.  CBG: Recent Labs  Lab 06/09/22 0619 06/09/22 1111 06/09/22 1600 06/09/22 2119 06/10/22 0603  GLUCAP 130* 173* 250* 200* 147*   Lipid Profile: Recent Labs    06/10/22 0550  CHOL 79  HDL 27*  LDLCALC 40  TRIG 60  CHOLHDL 2.9   Thyroid Function Tests: No results for input(s): "TSH", "T4TOTAL", "FREET4", "T3FREE", "THYROIDAB" in the last 72 hours. Anemia Panel: No results for input(s): "VITAMINB12", "FOLATE", "FERRITIN", "TIBC", "IRON", "RETICCTPCT" in the last 72 hours. Urine analysis:    Component Value Date/Time   COLORURINE YELLOW 06/06/2022 [place] 06/06/2022 2254   LABSPEC 1.031 (H) 06/06/2022 2254   PHURINE 5.0 06/06/2022 2254   GLUCOSEU >=500 (A) 06/06/2022 2254   HGBUR SMALL (A) 06/06/2022 2254   BILIRUBINUR NEGATIVE 06/06/2022 2254   KETONESUR 80 (A) 06/06/2022 2254   PROTEINUR 30 (A) 06/06/2022 2254   NITRITE NEGATIVE 06/06/2022 2254   LEUKOCYTESUR NEGATIVE 06/06/2022 2254   Sepsis Labs: '@LABRCNTIP'$ (procalcitonin:4,lacticidven:4)  ) Recent Results (from the past 240 hour(s))  Respiratory (~20 pathogens) panel by PCR     Status: None   Collection Time: 06/07/22  9:43 AM   Specimen: Nasopharyngeal Swab; Respiratory  Result Value Ref Range Status   Adenovirus NOT DETECTED NOT DETECTED Final   Coronavirus 229E NOT DETECTED NOT DETECTED Final    Comment: (NOTE) The Coronavirus on the Respiratory Panel, DOES NOT test for the novel  Coronavirus (2019 nCoV)    Coronavirus HKU1 NOT DETECTED NOT DETECTED Final   Coronavirus NL63 NOT DETECTED NOT DETECTED Final   Coronavirus OC43 NOT DETECTED NOT DETECTED Final   Metapneumovirus NOT DETECTED NOT DETECTED Final   Rhinovirus /  Enterovirus NOT DETECTED NOT DETECTED Final   Influenza A NOT DETECTED NOT DETECTED Final   Influenza B NOT DETECTED NOT DETECTED Final   Parainfluenza Virus 1 NOT DETECTED NOT DETECTED Final   Parainfluenza Virus 2 NOT DETECTED NOT DETECTED Final   Parainfluenza Virus 3 NOT DETECTED NOT DETECTED Final   Parainfluenza Virus 4 NOT DETECTED NOT DETECTED Final   Respiratory Syncytial Virus NOT DETECTED NOT DETECTED Final   Bordetella pertussis NOT DETECTED NOT DETECTED Final   Bordetella Parapertussis NOT DETECTED NOT DETECTED Final   Chlamydophila pneumoniae NOT DETECTED NOT DETECTED Final   Mycoplasma pneumoniae NOT DETECTED NOT DETECTED Final    Comment: Performed at [facility], [place] [address], [place], [place] [address]     Radiology Studies: ECHOCARDIOGRAM COMPLETE  Result Date: 06/08/2022    ECHOCARDIOGRAM REPORT   Patient Name:   Ricardo Zuniga Date of Exam: 06/08/2022 Medical Rec #:  [id number]        Height:       69.0  in Accession #:    [id number]       Weight:       184.3 lb Date of Birth:  04-Mar-1951        BSA:          1.995 m Patient Age:    49 years         BP:           105/70 mmHg Patient Gender: M                HR:           107 bpm. Exam Location:  Inpatient Procedure: 2D Echo, 3D Echo, Cardiac Doppler and Color Doppler Indications:    Dyspnea  History:        Patient has prior history of Echocardiogram examinations, most                 recent 02/17/2022. CHF and Cardiomyopathy; Risk                 Factors:Hypertension, Diabetes and Former Smoker. HFrEF, APML.  Sonographer:    Clayton Lefort RDCS (AE) Referring Phys: [place]  1. Left ventricular ejection fraction, by estimation, is 30 to 35%. The left ventricle has moderately decreased function. The left ventricle demonstrates global hypokinesis. The left ventricular internal cavity size was moderately dilated. Left ventricular diastolic parameters are indeterminate.  2. Right ventricular systolic  function is mildly reduced. The right ventricular size is mildly enlarged.  3. Left atrial size was mildly dilated.  4. The mitral valve is abnormal. Mild mitral valve regurgitation. No evidence of mitral stenosis.  5. The aortic valve is tricuspid. There is mild calcification of the aortic valve. There is mild thickening of the aortic valve. Aortic valve regurgitation is mild. Aortic valve sclerosis/calcification is present, without any evidence of aortic stenosis.  6. The inferior vena cava is dilated in size with >50% respiratory variability, suggesting right atrial pressure of 8 mmHg. FINDINGS  Left Ventricle: Left ventricular ejection fraction, by estimation, is 30 to 35%. The left ventricle has moderately decreased function. The left ventricle demonstrates global hypokinesis. The left ventricular internal cavity size was moderately dilated. There is no left ventricular hypertrophy. Left ventricular diastolic parameters are indeterminate. Right Ventricle: The right ventricular size is mildly enlarged. No increase in right ventricular wall thickness. Right ventricular systolic function is mildly reduced. Left Atrium: Left atrial size was mildly dilated. Right Atrium: Right atrial size was normal in size. Pericardium: Trivial pericardial effusion is present. The pericardial effusion is posterior to the left ventricle. Mitral Valve: The mitral valve is abnormal. There is mild thickening of the mitral valve leaflet(s). There is mild calcification of the mitral valve leaflet(s). Mild mitral valve regurgitation. No evidence of mitral valve stenosis. Tricuspid Valve: The tricuspid valve is normal in structure. Tricuspid valve regurgitation is mild . No evidence of tricuspid stenosis. Aortic Valve: The aortic valve is tricuspid. There is mild calcification of the aortic valve. There is mild thickening of the aortic valve. Aortic valve regurgitation is mild. Aortic regurgitation PHT measures 185 msec. Aortic valve  sclerosis/calcification is present, without any evidence of aortic stenosis. Aortic valve mean gradient measures 4.0 mmHg. Aortic valve peak gradient measures 6.2 mmHg. Aortic valve area, by VTI measures 2.33 cm. Pulmonic Valve: The pulmonic valve was normal in structure. Pulmonic valve regurgitation is mild. No evidence of pulmonic stenosis. Aorta: The aortic root is normal in size and structure. Venous: The inferior vena  cava is dilated in size with greater than 50% respiratory variability, suggesting right atrial pressure of 8 mmHg. IAS/Shunts: No atrial level shunt detected by color flow Doppler.  LEFT VENTRICLE PLAX 2D LVIDd:         6.40 cm LVIDs:         5.70 cm LV PW:         0.80 cm LV IVS:        0.80 cm LVOT diam:     2.00 cm LV SV:         50 LV SV Index:   25 LVOT Area:     3.14 cm  LV Volumes (MOD) LV vol d, MOD A2C: 159.0 ml LV vol d, MOD A4C: 156.0 ml LV vol s, MOD A2C: 110.0 ml LV vol s, MOD A4C: 103.0 ml LV SV MOD A2C:     49.0 ml LV SV MOD A4C:     156.0 ml LV SV MOD BP:      53.0 ml RIGHT VENTRICLE             IVC RV Basal diam:  3.50 cm     IVC diam: 2.10 cm RV S prime:     10.40 cm/s TAPSE (M-mode): 1.9 cm LEFT ATRIUM             Index        RIGHT ATRIUM           Index LA diam:        3.90 cm 1.95 cm/m   RA Area:     14.60 cm LA Vol (A2C):   64.5 ml 32.32 ml/m  RA Volume:   41.20 ml  20.65 ml/m LA Vol (A4C):   71.0 ml 35.58 ml/m LA Biplane Vol: 67.6 ml 33.88 ml/m  AORTIC VALVE AV Area (Vmax):    2.76 cm AV Area (Vmean):   2.48 cm AV Area (VTI):     2.33 cm AV Vmax:           125.00 cm/s AV Vmean:          91.300 cm/s AV VTI:            0.214 m AV Peak Grad:      6.2 mmHg AV Mean Grad:      4.0 mmHg LVOT Vmax:         110.00 cm/s LVOT Vmean:        72.100 cm/s LVOT VTI:          0.159 m LVOT/AV VTI ratio: 0.74 AI PHT:            185 msec  AORTA Ao Root diam: 3.60 cm Ao Asc diam:  3.60 cm  SHUNTS Systemic VTI:  0.16 m Systemic Diam: 2.00 cm Jenkins Rouge MD Electronically signed by  Jenkins Rouge MD Signature Date/Time: 06/08/2022/4:36:03 PM    Final      Scheduled Meds:  aspirin EC  81 mg Oral q AM   atorvastatin  40 mg Oral Daily   azithromycin  500 mg Oral q1800   benzonatate  100 mg Oral TID   cholecalciferol  5,000 Units Oral q AM   [START ON 06/11/2022] enoxaparin (LOVENOX) injection  40 mg Subcutaneous QHS   fluticasone  1 spray Each Nare Daily   insulin aspart  0-15 Units Subcutaneous TID WC   insulin glargine-yfgn  6 Units Subcutaneous Daily   loratadine  10 mg Oral Daily   melatonin  5 mg Oral  QHS   metoprolol succinate  12.5 mg Oral Daily   pantoprazole  40 mg Oral Daily   sacubitril-valsartan  1 tablet Oral BID   saline  1 Application Each Nare H8I   sodium chloride flush  3 mL Intravenous Q12H   sodium chloride flush  3 mL Intravenous Q12H   sodium chloride flush  3 mL Intravenous Q12H   spironolactone  12.5 mg Oral Daily   Continuous Infusions:  sodium chloride     sodium chloride     cefTRIAXone (ROCEPHIN)  IV 2 g (06/09/22 2200)   lactated ringers Stopped (06/07/22 0242)     LOS: 3 days    Time spent: 53mn    PDomenic Polite MD [facility]   06/10/2022, 9:48 AM

## 2022-06-10 NOTE — Interval H&P Note (Signed)
History and Physical Interval Note:  06/10/2022 7:38 AM  Ricardo Zuniga  has presented today for surgery, with the diagnosis of heart failure.  The various methods of treatment have been discussed with the patient and family. After consideration of risks, benefits and other options for treatment, the patient has consented to  Procedure(s): RIGHT/LEFT HEART CATH AND CORONARY ANGIOGRAPHY (N/A) as a surgical intervention.  The patient's history has been reviewed, patient examined, no change in status, stable for surgery.  I have reviewed the patient's chart and labs.  Questions were answered to the patient's satisfaction.     [name]

## 2022-06-10 NOTE — Progress Notes (Signed)
Reviewed results of R/LHC with [name]. He is stable for discharge from HF perspective.  Will arrange for f/u in HF clinic.  HF meds at discharge: Aspirin 81 daily Atorvastatin 40 daily Lasix 20 daily starting 11/01 Toprol XL 25 daily Entresto 24/26 BID Spiro 12.5 daily

## 2022-06-10 NOTE — Progress Notes (Signed)
   06/10/22 1131  Mobility  Activity Ambulated independently in hallway  Level of Assistance Independent  Assistive Device None  Distance Ambulated (ft) 150 ft  Activity Response Tolerated well  Mobility Referral Yes  $Mobility charge 1 Mobility   Mobility Specialist Progress Note  Received pt in bed having no complaints and agreeable to mobility. Pt was asymptomatic throughout ambulation and returned to room w/o fault. Left in bed w/ call bell in reach and all needs met.   Lucious Groves Mobility Specialist

## 2022-06-10 NOTE — Progress Notes (Addendum)
Advanced Heart Failure Rounding Note  PCP-Cardiologist: [name], MD  AHF: [name]   Subjective:     1.8L in [place] yesterday. Overall, net negative 4L this admit. Renal fx stable, SCr 2.04. K 4.4   BP remains soft, upper 72Y systolic.   No current CP. Breathing improving. Going for cath today    Objective:   Weight Range: 84.6 kg Body mass index is 27.53 kg/m.   Vital Signs:   Temp:  [97.9 F (36.6 C)-99.8 F (37.7 C)] 99.3 F (37.4 C) (10/31 0533) Pulse Rate:  [96-109] 101 (10/31 0533) Resp:  [18-20] 20 (10/31 0533) BP: (95-110)/(61-72) 96/62 (10/31 0533) SpO2:  [92 %-97 %] 92 % (10/31 0533) Weight:  [84.5 kg-84.6 kg] 84.6 kg (10/31 0533) Last BM Date : 06/08/22  Weight change: Filed Weights   06/08/22 0424 06/09/22 0844 06/10/22 0533  Weight: 83.6 kg 84.5 kg 84.6 kg    Intake/Output:   Intake/Output Summary (Last 24 hours) at 06/10/2022 0716 Last data filed at 06/10/2022 0705 Gross per 24 hour  Intake 616.94 ml  Output 2000 ml  Net -1383.06 ml      Physical Exam    General:  Well appearing. No resp difficulty HEENT: Normal Neck: Supple. JVP not well visualized . Carotids 2+ bilat; no bruits. No lymphadenopathy or thyromegaly appreciated. Cor: PMI nondisplaced. Regular rate & rhythm. No rubs, gallops or murmurs. Lungs: Clear Abdomen: Soft, nontender, nondistended. No hepatosplenomegaly. No bruits or masses. Good bowel sounds. Extremities: No cyanosis, clubbing, rash, edema Neuro: Alert & orientedx3, cranial nerves grossly intact. moves all 4 extremities w/o difficulty. Affect pleasant   Telemetry   NSR 90s   EKG    No new EKG to review   Labs    CBC Recent Labs    06/08/22 0041 06/09/22 0035  WBC 8.2 6.1  HGB 13.2 12.8*  HCT 39.6 36.3*  MCV 92.3 90.1  PLT 216 073   Basic Metabolic Panel Recent Labs    06/09/22 0035 06/10/22 0550  NA 136 137  K 3.9 4.4  CL 103 103  CO2 23 22  GLUCOSE 145* 156*  BUN 19 16   CREATININE 1.02 1.04  CALCIUM 9.0 9.5   Liver Function Tests No results for input(s): "AST", "ALT", "ALKPHOS", "BILITOT", "PROT", "ALBUMIN" in the last 72 hours. No results for input(s): "LIPASE", "AMYLASE" in the last 72 hours. Cardiac Enzymes No results for input(s): "CKTOTAL", "CKMB", "CKMBINDEX", "TROPONINI" in the last 72 hours.  BNP: BNP (last 3 results) Recent Labs    10/13/21 0920 06/06/22 1649  BNP 137.0* 1,624.1*    ProBNP (last 3 results) No results for input(s): "PROBNP" in the last 8760 hours.   D-Dimer No results for input(s): "DDIMER" in the last 72 hours. Hemoglobin A1C No results for input(s): "HGBA1C" in the last 72 hours. Fasting Lipid Panel Recent Labs    06/10/22 0550  CHOL 79  HDL 27*  LDLCALC 40  TRIG 60  CHOLHDL 2.9   Thyroid Function Tests No results for input(s): "TSH", "T4TOTAL", "T3FREE", "THYROIDAB" in the last 72 hours.  Invalid input(s): "FREET3"  Other results:   Imaging    No results found.   Medications:     Scheduled Medications:  aspirin  81 mg Oral Pre-Cath   aspirin EC  81 mg Oral q AM   atorvastatin  40 mg Oral Daily   azithromycin  500 mg Oral q1800   benzonatate  100 mg Oral TID   cholecalciferol  5,000 Units Oral q AM   enoxaparin (LOVENOX) injection  40 mg Subcutaneous Q24H   fluticasone  1 spray Each Nare Daily   insulin aspart  0-15 Units Subcutaneous TID WC   insulin glargine-yfgn  6 Units Subcutaneous Daily   loratadine  10 mg Oral Daily   melatonin  5 mg Oral QHS   metoprolol succinate  12.5 mg Oral Daily   pantoprazole  40 mg Oral Daily   sacubitril-valsartan  1 tablet Oral BID   saline  1 Application Each Nare V7Q   sodium chloride flush  3 mL Intravenous Q12H   sodium chloride flush  3 mL Intravenous Q12H   spironolactone  12.5 mg Oral Daily    Infusions:  sodium chloride     sodium chloride     sodium chloride 10 mL/hr at 06/10/22 0636   cefTRIAXone (ROCEPHIN)  IV 2 g (06/09/22 2200)    lactated ringers Stopped (06/07/22 0242)    PRN Medications: sodium chloride, sodium chloride, acetaminophen, albuterol, ALPRAZolam, dextrose, guaiFENesin, methocarbamol, ondansetron (ZOFRAN) IV, oxymetazoline, sodium chloride flush, sodium chloride flush    Patient Profile   71 y/o male w/ chronic systolic HF, most recent EF ~45%, NICM, mod- nonobstructive 1VCAD by cath in 2020 (40% p RCA), Type 2DM and h/o APML (acute promyelocytic leukemia) completed treatment at [facility] in 2014, now in remission, w/ subsequent [facility] cath related infection/ bacteremia and endocarditis in 2015, admitted w/ a/c CHF and suspected PNA. AHF team consulted given further drop in LVEF, now 30-35%, RV mildly reduced.    Assessment/Plan   1. Acute on Chronic Systolic Heart Failure - Had drop in EF in 2015, down to 25-30%, in setting of endocarditis. Also after completion of chemo for APML, though CM not felt to be due to chemo per pt report  - EF improved, 35-40% in 2017 - EF 40% in 2020  - [place] 1v CAD 40% pRCA>>NICM  - EF 50% in 2021 - EF 40% 7/23, now down further to 30-35%, RV mildly reduced. Denies ischemic CP  - etiology uncertain. Given known RCA disease on prior cath will restudy w/ [facility] today  - plan cMRI if cath unrevealing  - good response to Lasix, assess filling pressures by RHC today to guide further diuresis  - continue Entesto 26-26 mg bid. BP too soft for titration  - continue spironolactone 12.5 mg daily  - continue Toprol XL 12.5 mg daily  - would avoid SGLT2i given ketosis on admit  - may need digoxin if sinus tach persist    2. CAP  - Chest CT suggestive of PNA.  - PCT < 0.10, Respiratory panel negative - abx per primary team    3. CAD - mod- nonobstructive 1VCAD by cath in 2020 , 40% pRCA - denies CP, HS trop 19>>31 - given drop in EF, will plan LHC today - continue ASA, statin + ? blocker   4. Type 2DM  - Hgb A1c 6.6  - Mild ketosis on admit. Primary team does not  suspect DKA, likely due to acute illness and poor oral intake - Semglee dose decreased    5. APML - treated at [facility] in 2014 - in remission    6. H/o Endocarditis - 2015  - line associated bacteremia (H[place]man cath for chemo) - treated w/ abx     Length of Stay: 3  Ricardo Bonfiglio, PA-C  06/10/2022, 7:16 AM  Advanced Heart Failure Team Pager [phone] (M-F; 7a - 5p)  Please contact  [facility] for night-coverage after hours (5p -7a ) and weekends on [email or web address]

## 2022-06-10 NOTE — Progress Notes (Signed)
Pt has orders to be discharged. Discharge instructions given and pt has no additional questions at this time. Medication regimen reviewed and pt educated. Pt verbalized understanding and has no additional questions. Telemetry box removed. IV removed and site in good condition. Pt stable and waiting for transportation. 

## 2022-06-10 NOTE — TOC Transition Note (Signed)
Transition of Care [facility]) - CM/SW Discharge Note   Patient Details  Name: Ricardo Zuniga MRN: [id number] Date of Birth: 1950/12/16  Transition of [facility]) CM/SW Contact:  Zenon Mayo, RN Phone Number: 06/10/2022, 2:29 PM   Clinical Narrative:     Patient is for dc today, has no needs.        Patient Goals and CMS Choice        Discharge Placement                       Discharge Plan and Services                                     Social Determinants of Health (SDOH) Interventions     Readmission Risk Interventions    11/01/2021    3:26 PM  Readmission Risk Prevention Plan  Transportation Screening Complete  HRI or [place] Complete  Social Work Consult for [place] Planning/Counseling Complete  Palliative Care Screening Not Applicable  Medication Review [employer]) Complete

## 2022-06-17 DIAGNOSIS — I10 Essential (primary) hypertension: Secondary | ICD-10-CM | POA: Diagnosis not present

## 2022-06-17 DIAGNOSIS — E261 Secondary hyperaldosteronism: Secondary | ICD-10-CM | POA: Diagnosis not present

## 2022-06-17 DIAGNOSIS — I700 Atherosclerosis of aorta: Secondary | ICD-10-CM | POA: Diagnosis not present

## 2022-06-17 DIAGNOSIS — Z6827 Body mass index (BMI) 27.0-27.9, adult: Secondary | ICD-10-CM | POA: Diagnosis not present

## 2022-06-17 DIAGNOSIS — I5022 Chronic systolic (congestive) heart failure: Secondary | ICD-10-CM | POA: Diagnosis not present

## 2022-06-19 NOTE — Discharge Summary (Signed)
Physician Discharge Summary  Ricardo Zuniga:[id number] DOB: [birth year] DOA: 06/06/2022  PCP: Glenda Chroman, MD  Admit date: 06/06/2022 Discharge date: 06/10/2022  Time spent: 35 minutes  Recommendations for Outpatient Follow-up:  PCP in 1 week, please review all imaging studies from this admission Follow-up chest x-ray in 6 to 12 weeks   Discharge Diagnoses:  Principal Problem:   CAP (community acquired pneumonia) Active Problems:   Acute on chronic HFrEF (heart failure with reduced ejection fraction) ([place])   Diabetic ketoacidosis without coma associated with type 2 diabetes mellitus ([place])   Essential hypertension   Hyperlipidemia   Acute on chronic congestive heart failure ([place])   Discharge Condition: Improved  Diet recommendation: Low-sodium, diabetic  Filed Weights   06/08/22 0424 06/09/22 0844 06/10/22 0533  Weight: 83.6 kg 84.5 kg 84.6 kg    History of present illness:  Ricardo Zuniga is a 71 y.o. male with medical history significant of Chronic systolic CHF EF 28%,, APML in remission, DM2, HTN. Pt with SOB for the past month Abruptly worsening in past couple of days with subjective fever at home.  Mild cough, has mild BLE Edema, no h/o DVT nor PE. -was scheduled for Coronary CTA just prior to this admission for DoE In ED, VSS, labs w/ mild leukocytosis, BNP 1624, CT Chest w/ Multifocal PNA  Hospital Course:   CAP (community acquired pneumonia) -Flu and COVID PCR, respiratory virus panel negative -Treated with Rocephin and azithromycin, changed to p.o. antibiotics -For sinus congestion we used Claritin, saline nasal spray  -Improved -Follow-up chest x-ray in 6 to 12 weeks   Acute on chronic systolic CHF -Last echo in July with EF of 45% -With progressive dyspnea on exertion over the last few months-was due to have an outpatient coronary CTA just prior to this admission, repeat echo with EF down to 30-35%, will request cardiology input -Diuresed with IV  Lasix he is 5 L negative, left heart cath today noted nonobstructive CAD -Continue Aldactone, Entresto, beta-blocker, transitioned to oral Lasix -Mild metabolic acidosis on admission could have been related to [place], now discontinued -Follow-up in the CHF clinic   History of nonobstructive CAD -was scheduled for coronary CTA just prior to admission, repeat echo with EF down to 30-35%, will request cards input -No ACS at this time -Continue aspirin, metoprolol and statin   Type 2 diabetes mellitus -Mild ketosis, do not suspect DKA, likely with acute illness and poor oral intake, Jardiance discontinued on account of ketoacidosis, continue repaglinide -HbA1c is 6.6   Essential hypertension Continue entresto, aldactone, metoprolol   Hyperlipidemia Continue statin.   Consultations: Heart failure  Discharge Exam: Vitals:   06/10/22 0839 06/10/22 1036  BP: 96/64 98/67  Pulse: 95 96  Resp: 20   Temp: 99.4 F (37.4 C)   SpO2: 95% 92%   AAOx3, no distress HEENT: No JVD CVS: S1-S2, regular rhythm Lungs: Rare basilar rales Abdomen: Soft, nontender, bowel sounds present Extremities: No edema  Neuro: Moves all extremities, no localizing signs Skin: No rashes Psychiatry:  Mood & affect appropriate.   Discharge Instructions   Discharge Instructions     AMB Referral to Cardiac Rehabilitation - Phase II   Complete by: As directed    Diagnosis: Heart Failure (see criteria below if ordering Phase II)   Heart Failure Type: Chronic Systolic   After initial evaluation and assessments completed: Virtual Based Care may be provided alone or in conjunction with Phase 2 Cardiac Rehab based on patient barriers.: Yes  Intensive Cardiac Rehabilitation (ICR) [facility] location only OR Traditional Cardiac Rehabilitation (TCR) *If criteria for ICR are not met will enroll in TCR [facility] only): Yes   Diet - low sodium heart healthy   Complete by: As directed    Increase activity slowly   Complete  by: As directed       Allergies as of 06/10/2022       Reactions   Flonase [fluticasone]    Lisinopril Cough        Medication List     STOP taking these medications    Jardiance 10 MG Tabs tablet Generic drug: empagliflozin   methocarbamol 500 MG tablet Commonly known as: ROBAXIN   naproxen sodium 220 MG tablet Commonly known as: ALEVE       TAKE these medications    acetaminophen 650 MG CR tablet Commonly known as: TYLENOL Take 650-1,300 mg by mouth every 8 (eight) hours as needed for pain.   albuterol 108 (90 Base) MCG/ACT inhaler Commonly known as: VENTOLIN HFA Inhale 2 puffs into the lungs every 6 (six) hours as needed for wheezing or shortness of breath.   ALPRAZolam 0.25 MG tablet Commonly known as: XANAX Take 0.25 mg by mouth at bedtime as needed for anxiety or sleep.   aspirin EC 81 MG tablet Take 81 mg by mouth in the morning.   atorvastatin 40 MG tablet Commonly known as: LIPITOR Take 1 tablet (40 mg total) by mouth daily.   diazepam 5 MG tablet Commonly known as: VALIUM Take 5 mg by mouth daily as needed (vertigo).   Entresto 24-26 MG Generic drug: sacubitril-valsartan Take 1 tablet by mouth twice daily   furosemide 20 MG tablet Commonly known as: LASIX Take 1 tablet (20 mg total) by mouth daily.   guaiFENesin 600 MG 12 hr tablet Commonly known as: MUCINEX Take 600 mg by mouth daily as needed (congestion.).   metoprolol succinate 25 MG 24 hr tablet Commonly known as: TOPROL-XL Take 1 tablet (25 mg total) by mouth daily. What changed:  how much to take when to take this   omeprazole 20 MG capsule Commonly known as: PRILOSEC Take 20 mg by mouth daily before breakfast.   ondansetron 4 MG tablet Commonly known as: ZOFRAN Take 1 tablet (4 mg total) by mouth every 6 (six) hours as needed for nausea.   repaglinide 2 MG tablet Commonly known as: PRANDIN Take 2 mg by mouth 3 (three) times daily before meals.   spironolactone 25  MG tablet Commonly known as: ALDACTONE Take 1/2 (one-half) tablet by mouth once daily   Vitamin D3 125 MCG (5000 UT) Tabs Take 1 tablet by mouth in the morning.   zinc gluconate 50 MG tablet Take 50 mg by mouth in the morning.       ASK your doctor about these medications    cefdinir 300 MG capsule Commonly known as: OMNICEF Take 1 capsule (300 mg total) by mouth every 12 (twelve) hours for 4 days. Ask about: Should I take this medication?       Allergies  Allergen Reactions   Flonase [Fluticasone]    Lisinopril Cough    Follow-up Information     Glenda Chroman, MD. Go on 06/17/2022.   Specialty: Internal Medicine Why: _0 :15pm Contact information: [place] [id number] Follow up on 06/24/2022.   Specialty: Cardiology Why: [facility] 3  pm Entrance C, Free Valet Parking Contact information: [address] [id number] [place] [phone]                 The results of significant diagnostics from this hospitalization (including imaging, microbiology, ancillary and laboratory) are listed below for reference.    Significant Diagnostic Studies: CARDIAC CATHETERIZATION  Result Date: 06/10/2022   Prox RCA lesion is 40% stenosed. HEMODYNAMICS: RA:   8 mmHg (mean) RV:   56/5-8 mmHg PA:   52/25 mmHg (34 mean) PCWP:  14 mmHg (mean)    Estimated Fick CO/CI   4.3 L/min, 2.1 L/min/m2    TPG    20  mmHg     PVR     4.6 Wood Units PAPi      3.4  IMPRESSION: 1.   Mildly elevated pre-capillary filling pressures and PVR likely secondary to chronic HFrEF 2.    Normal left sided filling pressures after IV diuresis. 3.   Moderately reduced cardiac index 4.    No obstructive coronary artery disease. Aditya Sabharwal Advanced Heart Failure 2:04 PM  ECHOCARDIOGRAM COMPLETE  Result Date: 06/08/2022    ECHOCARDIOGRAM REPORT   Patient Name:    RENE SIZELOVE Date of Exam: 06/08/2022 Medical Rec #:  [id number]        Height:       69.0 in Accession #:    [id number]       Weight:       184.3 lb Date of Birth:  09-27-1950        BSA:          1.995 m Patient Age:    24 years         BP:           105/70 mmHg Patient Gender: M                HR:           107 bpm. Exam Location:  Inpatient Procedure: 2D Echo, 3D Echo, Cardiac Doppler and Color Doppler Indications:    Dyspnea  History:        Patient has prior history of Echocardiogram examinations, most                 recent 02/17/2022. CHF and Cardiomyopathy; Risk                 Factors:Hypertension, Diabetes and Former Smoker. HFrEF, APML.  Sonographer:    Clayton Lefort RDCS (AE) Referring Phys: New Site  1. Left ventricular ejection fraction, by estimation, is 30 to 35%. The left ventricle has moderately decreased function. The left ventricle demonstrates global hypokinesis. The left ventricular internal cavity size was moderately dilated. Left ventricular diastolic parameters are indeterminate.  2. Right ventricular systolic function is mildly reduced. The right ventricular size is mildly enlarged.  3. Left atrial size was mildly dilated.  4. The mitral valve is abnormal. Mild mitral valve regurgitation. No evidence of mitral stenosis.  5. The aortic valve is tricuspid. There is mild calcification of the aortic valve. There is mild thickening of the aortic valve. Aortic valve regurgitation is mild. Aortic valve sclerosis/calcification is present, without any evidence of aortic stenosis.  6. The inferior vena cava is dilated in size with >50% respiratory variability, suggesting right atrial pressure of 8 mmHg. FINDINGS  Left Ventricle: Left ventricular ejection fraction, by estimation, is 30 to 35%. The left ventricle has moderately decreased function. The left ventricle  demonstrates global hypokinesis. The left ventricular internal cavity size was moderately dilated. There is no left  ventricular hypertrophy. Left ventricular diastolic parameters are indeterminate. Right Ventricle: The right ventricular size is mildly enlarged. No increase in right ventricular wall thickness. Right ventricular systolic function is mildly reduced. Left Atrium: Left atrial size was mildly dilated. Right Atrium: Right atrial size was normal in size. Pericardium: Trivial pericardial effusion is present. The pericardial effusion is posterior to the left ventricle. Mitral Valve: The mitral valve is abnormal. There is mild thickening of the mitral valve leaflet(s). There is mild calcification of the mitral valve leaflet(s). Mild mitral valve regurgitation. No evidence of mitral valve stenosis. Tricuspid Valve: The tricuspid valve is normal in structure. Tricuspid valve regurgitation is mild . No evidence of tricuspid stenosis. Aortic Valve: The aortic valve is tricuspid. There is mild calcification of the aortic valve. There is mild thickening of the aortic valve. Aortic valve regurgitation is mild. Aortic regurgitation PHT measures 185 msec. Aortic valve sclerosis/calcification is present, without any evidence of aortic stenosis. Aortic valve mean gradient measures 4.0 mmHg. Aortic valve peak gradient measures 6.2 mmHg. Aortic valve area, by VTI measures 2.33 cm. Pulmonic Valve: The pulmonic valve was normal in structure. Pulmonic valve regurgitation is mild. No evidence of pulmonic stenosis. Aorta: The aortic root is normal in size and structure. Venous: The inferior vena cava is dilated in size with greater than 50% respiratory variability, suggesting right atrial pressure of 8 mmHg. IAS/Shunts: No atrial level shunt detected by color flow Doppler.  LEFT VENTRICLE PLAX 2D LVIDd:         6.40 cm LVIDs:         5.70 cm LV PW:         0.80 cm LV IVS:        0.80 cm LVOT diam:     2.00 cm LV SV:         50 LV SV Index:   25 LVOT Area:     3.14 cm  LV Volumes (MOD) LV vol d, MOD A2C: 159.0 ml LV vol d, MOD A4C: 156.0 ml  LV vol s, MOD A2C: 110.0 ml LV vol s, MOD A4C: 103.0 ml LV SV MOD A2C:     49.0 ml LV SV MOD A4C:     156.0 ml LV SV MOD BP:      53.0 ml RIGHT VENTRICLE             IVC RV Basal diam:  3.50 cm     IVC diam: 2.10 cm RV S prime:     10.40 cm/s TAPSE (M-mode): 1.9 cm LEFT ATRIUM             Index        RIGHT ATRIUM           Index LA diam:        3.90 cm 1.95 cm/m   RA Area:     14.60 cm LA Vol (A2C):   64.5 ml 32.32 ml/m  RA Volume:   41.20 ml  20.65 ml/m LA Vol (A4C):   71.0 ml 35.58 ml/m LA Biplane Vol: 67.6 ml 33.88 ml/m  AORTIC VALVE AV Area (Vmax):    2.76 cm AV Area (Vmean):   2.48 cm AV Area (VTI):     2.33 cm AV Vmax:           125.00 cm/s AV Vmean:          91.300 cm/s AV  VTI:            0.214 m AV Peak Grad:      6.2 mmHg AV Mean Grad:      4.0 mmHg LVOT Vmax:         110.00 cm/s LVOT Vmean:        72.100 cm/s LVOT VTI:          0.159 m LVOT/AV VTI ratio: 0.74 AI PHT:            185 msec  AORTA Ao Root diam: 3.60 cm Ao Asc diam:  3.60 cm  SHUNTS Systemic VTI:  0.16 m Systemic Diam: 2.00 cm Jenkins Rouge MD Electronically signed by Jenkins Rouge MD Signature Date/Time: 06/08/2022/4:36:03 PM    Final    CT Angio Chest PE W and/or Wo Contrast  Result Date: 06/06/2022 CLINICAL DATA:  Suspected pulmonary embolism. EXAM: CT ANGIOGRAPHY CHEST WITH CONTRAST TECHNIQUE: Multidetector CT imaging of the chest was performed using the standard protocol during bolus administration of intravenous contrast. Multiplanar CT image reconstructions and MIPs were obtained to evaluate the vascular anatomy. RADIATION DOSE REDUCTION: This exam was performed according to the departmental dose-optimization program which includes automated exposure control, adjustment of the mA and/or kV according to patient size and/or use of iterative reconstruction technique. CONTRAST:  84m OMNIPAQUE IOHEXOL 350 MG/ML SOLN COMPARISON:  None Available. FINDINGS: Cardiovascular: There is mild calcification of the aortic arch, without  evidence of aortic aneurysm. Satisfactory opacification of the pulmonary arteries to the segmental level. No evidence of pulmonary embolism. There is mild cardiomegaly and mild coronary artery calcification. No pericardial effusion. Mediastinum/Nodes: There is mild AP window, pretracheal and subcarinal lymphadenopathy. Thyroid gland, trachea, and esophagus demonstrate no significant findings. Lungs/Pleura: Mild to moderate severity patchy posterior right upper lobe infiltrate is seen. Mild areas of atelectasis and/or early infiltrate are also seen within the anterior aspect of the left upper lobe and posterior aspects of the bilateral lung bases. There is a small right pleural effusion. No pneumothorax is identified. Upper Abdomen: No acute abnormality. Musculoskeletal: No chest wall abnormality. No acute or significant osseous findings. Review of the MIP images confirms the above findings. IMPRESSION: 1. No evidence of pulmonary embolism. 2. Mild to moderate severity posterior right upper lobe infiltrate. 3. Mild left upper lobe and bibasilar atelectasis and/or early infiltrate. 4. Small right pleural effusion. 5. Mild cardiomegaly and mild coronary artery calcification. 6. Aortic atherosclerosis. Aortic Atherosclerosis (ICD10-I70.0). Electronically Signed   By: TVirgina NorfolkM.D.   On: 06/06/2022 23:43   DG Chest 2 View  Result Date: 06/06/2022 CLINICAL DATA:  Shortness of breath EXAM: CHEST - 2 VIEW COMPARISON:  Chest x-ray 05/20/2022 FINDINGS: Heart is mildly enlarged. There is central pulmonary vascular congestion. The lungs are clear. There is no pleural effusion or pneumothorax. There surgical clips in the upper abdomen. No acute fractures are seen. IMPRESSION: Mild cardiomegaly with central pulmonary vascular congestion. Electronically Signed   By: ARonney AstersM.D.   On: 06/06/2022 17:12   DG Chest 2 View  Result Date: 05/21/2022 CLINICAL DATA:  Shortness of breath. EXAM: CHEST - 2 VIEW  COMPARISON:  October 30, 2021 FINDINGS: The heart size and mediastinal contours are within normal limits. Mild cephalization of flow is identified. There is no pulmonary edema, focal pneumonia or pleural effusion. The visualized skeletal structures are stable. Prior cholecystectomy clips are noted. IMPRESSION: Mild cephalization of flow is identified without pulmonary edema. Electronically Signed   By: WMallie DartingD.  On: 05/21/2022 13:48    Microbiology: No results found for this or any previous visit (from the past 240 hour(s)).   Labs: Basic Metabolic Panel: No results for input(s): "NA", "K", "CL", "CO2", "GLUCOSE", "BUN", "CREATININE", "CALCIUM", "MG", "PHOS" in the last 168 hours. Liver Function Tests: No results for input(s): "AST", "ALT", "ALKPHOS", "BILITOT", "PROT", "ALBUMIN" in the last 168 hours. No results for input(s): "LIPASE", "AMYLASE" in the last 168 hours. No results for input(s): "AMMONIA" in the last 168 hours. CBC: No results for input(s): "WBC", "NEUTROABS", "HGB", "HCT", "MCV", "PLT" in the last 168 hours. Cardiac Enzymes: No results for input(s): "CKTOTAL", "CKMB", "CKMBINDEX", "TROPONINI" in the last 168 hours. BNP: BNP (last 3 results) Recent Labs    10/13/21 0920 06/06/22 1649  BNP 137.0* 1,624.1*    ProBNP (last 3 results) No results for input(s): "PROBNP" in the last 8760 hours.  CBG: No results for input(s): "GLUCAP" in the last 168 hours.     Signed:  Domenic Polite MD.  [facility] 06/19/2022, 1:42 PM

## 2022-06-24 ENCOUNTER — Encounter (HOSPITAL_COMMUNITY): Payer: Self-pay

## 2022-06-24 ENCOUNTER — Ambulatory Visit (HOSPITAL_COMMUNITY)
Admit: 2022-06-24 | Discharge: 2022-06-24 | Disposition: A | Discharge status: Home / Self Care | Payer: Medicare PPO | Attending: Family Medicine | Admitting: Family Medicine

## 2022-06-24 VITALS — BP 114/72 | HR 102 | Wt 183.8 lb

## 2022-06-24 DIAGNOSIS — J189 Pneumonia, unspecified organism: Secondary | ICD-10-CM

## 2022-06-24 DIAGNOSIS — I2510 Atherosclerotic heart disease of native coronary artery without angina pectoris: Secondary | ICD-10-CM | POA: Insufficient documentation

## 2022-06-24 DIAGNOSIS — I5022 Chronic systolic (congestive) heart failure: Secondary | ICD-10-CM | POA: Diagnosis not present

## 2022-06-24 DIAGNOSIS — Z79899 Other long term (current) drug therapy: Secondary | ICD-10-CM | POA: Insufficient documentation

## 2022-06-24 DIAGNOSIS — C9241 Acute promyelocytic leukemia, in remission: Secondary | ICD-10-CM | POA: Insufficient documentation

## 2022-06-24 DIAGNOSIS — E1140 Type 2 diabetes mellitus with diabetic neuropathy, unspecified: Secondary | ICD-10-CM | POA: Insufficient documentation

## 2022-06-24 DIAGNOSIS — Z7982 Long term (current) use of aspirin: Secondary | ICD-10-CM | POA: Diagnosis not present

## 2022-06-24 DIAGNOSIS — Z8679 Personal history of other diseases of the circulatory system: Secondary | ICD-10-CM | POA: Diagnosis not present

## 2022-06-24 DIAGNOSIS — I428 Other cardiomyopathies: Secondary | ICD-10-CM | POA: Diagnosis not present

## 2022-06-24 DIAGNOSIS — E1169 Type 2 diabetes mellitus with other specified complication: Secondary | ICD-10-CM | POA: Diagnosis not present

## 2022-06-24 DIAGNOSIS — Z7984 Long term (current) use of oral hypoglycemic drugs: Secondary | ICD-10-CM | POA: Insufficient documentation

## 2022-06-24 LAB — BASIC METABOLIC PANEL
Anion gap: 11 (ref 5–15)
BUN: 21 mg/dL (ref 8–23)
CO2: 20 mmol/L — ABNORMAL LOW (ref 22–32)
Calcium: 9 mg/dL (ref 8.9–10.3)
Chloride: 103 mmol/L (ref 98–111)
Creatinine, Ser: 1.01 mg/dL (ref 0.61–1.24)
GFR, Estimated: >60 mL/min (ref >60)
Glucose, Bld: 301 mg/dL — ABNORMAL HIGH (ref 70–99)
Potassium: 4.2 mmol/L (ref 3.5–5.1)
Sodium: 134 mmol/L — ABNORMAL LOW (ref 135–145)

## 2022-06-24 LAB — BRAIN NATRIURETIC PEPTIDE: B Natriuretic Peptide: 1721.3 pg/mL — ABNORMAL HIGH (ref 0.0–100.0)

## 2022-06-24 MED ORDER — METOPROLOL SUCCINATE ER 25 MG PO TB24: 25.0000 mg | ORAL_TABLET | Freq: Every day | ORAL | 7 refills | Status: DC

## 2022-06-24 NOTE — Progress Notes (Signed)
ADVANCED HF CLINIC CONSULT NOTE  Primary Care: Glenda Chroman, MD Primary Cardiologist: Dr. Domenic Polite HF Cardiologist: [name]  HPI: 71 y.o. male w/ chronic systolic heart failure due to NICM, T2DM,  H/o APML (acute promyelocytic leukemia) completed treatment at [facility] in 2014, now in remission. 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 He was treated w/ abx. Per patient and wife report, his hematologist did not think that his CM was chemo-induced, and felt mostly likely triggered by endocarditis.    Repeat echo in 2017 showed interval improvement,  LVEF up to 35-40%.    Had [place] in 2020 that showed mod nonobstructive one-vessel coronary artery disease, prox RCA 40% stenosed.    Echo 7/21 EF 50%, RV normal    Echo 7/23 EF 45%.    Admitted 10/23 with a/c CHF and PNA. Diuresed with IV lasix. Echo showed EF down to 30-35%, Global HK, mildly reduced RV and mild MR. AHF team consulted. Of note, pt also w/o polyneuropathy including carpal tunnel s/p CTR and spinal stenosis. Underwent R/LHC showing mildly elevated pre-capillary filling pressures and PVR, likely secondary to chronic HFrEF, normal left sided filling pressures, moderately reduced CO and no obstructive CAD. GDMT titrated but Jardiance held with concern of ketosis on admit. He was discharged home, weight 186 lbs.  Today he returns for post hospital HF follow up. He has "good days and bad days." Feels fatigued. He has mild SOB walking on flat ground, but he can walk up a flight of stairs without dyspnea if he takes his time. He restarted his Jardiance since discharge. Denies palpitations, abnormal bleeding, CP, dizziness, edema, or PND/Orthopnea. Appetite ok. No fever or chills. Weight at home 175 pounds. Taking all medications. Retired Scientist, forensic. Wife is a Marine scientist.   Cardiac Studies    - R/LHC (10/23): nonobstructive CAD RA 8 PA 52/25 (34 mean) PCWP 14 (mean) CO/CI (Fick): 4.3/2.1                                 PVR 4.6 WU          PAPi 3.4              - Echo (06/08/22): EF 30-35%, global HK, RV mildly reduced, mild MR    - Echo (7/23): EF 45%, RV normal   - Echo (8/22): EF 40-45%, RV normal   - Echo (7/21): EF 50%, RV normal  - LHC (09/2018): Moderate nonobstructive one-vessel coronary artery disease, prox RCA 40% stenosed.   - Echo (08/2018): EF 40%, RV normal  - Echo (05/2016): EF 35-40%, RV normal   - Echo (05/2014): EF 25-30%, RV mildly reduced  - Echo (05/2012): EF 50%, RV normal   Review of Systems: [y] = yes, '[ ]'$  = no   General: Weight gain '[ ]'$ ; Weight loss '[ ]'$ ; Anorexia '[ ]'$ ; Fatigue Blue.Reese ]; Fever '[ ]'$ ; Chills '[ ]'$ ; Weakness Blue.Reese ]  Cardiac: Chest pain/pressure '[ ]'$ ; Resting SOB '[ ]'$ ; Exertional SOB '[ ]'$ ; Orthopnea '[ ]'$ ; Pedal Edema '[ ]'$ ; Palpitations '[ ]'$ ; Syncope '[ ]'$ ; Presyncope '[ ]'$ ; Paroxysmal nocturnal dyspnea'[ ]'$   Pulmonary: Cough '[ ]'$ ; Wheezing'[ ]'$ ; Hemoptysis'[ ]'$ ; Sputum '[ ]'$ ; Snoring '[ ]'$   GI: Vomiting'[ ]'$ ; Dysphagia'[ ]'$ ; Melena'[ ]'$ ; Hematochezia '[ ]'$ ; Heartburn'[ ]'$ ; Abdominal  pain '[ ]'$ ; Constipation '[ ]'$ ; Diarrhea '[ ]'$ ; BRBPR '[ ]'$   GU: Hematuria'[ ]'$ ; Dysuria '[ ]'$ ; Nocturia'[ ]'$   Vascular: Pain in legs with walking '[ ]'$ ; Pain in feet with lying flat '[ ]'$ ; Non-healing sores '[ ]'$ ; Stroke '[ ]'$ ; TIA '[ ]'$ ; Slurred speech '[ ]'$ ;  Neuro: Headaches'[ ]'$ ; Vertigo'[ ]'$ ; Seizures'[ ]'$ ; Paresthesias'[ ]'$ ;Blurred vision '[ ]'$ ; Diplopia '[ ]'$ ; Vision changes '[ ]'$   Ortho/Skin: Arthritis '[ ]'$ ; Joint pain '[ ]'$ ; Muscle pain '[ ]'$ ; Joint swelling '[ ]'$ ; Back Pain '[ ]'$ ; Rash '[ ]'$   Psych: Depression'[ ]'$ ; Anxiety'[ ]'$   Heme: Bleeding problems '[ ]'$ ; Clotting disorders '[ ]'$ ; Anemia Blue.Reese ]  Endocrine: Diabetes Blue.Reese ]; Thyroid dysfunction'[ ]'$   Past Medical History:  Diagnosis Date   Anal fissure    APML (acute promyelocytic leukemia) in remission ([place])    Completed treatment 04/2013   Coronary artery disease    Nonobstructive at  cardiac catheterization 09/2018   Difficult intubation    w/shoulder surgery at surgical center between 2005-2008   Gastroesophageal reflux disease    History of blood transfusion 2014   History of kidney stones    passed stones   Hypercholesteremia    Neuropathy    bilateral feet   Nonischemic cardiomyopathy ([place])    a. EF 20% in 2015 - thought to possibly be viral induced or chemotherapy induced from treatments in 2014 b. at 35-40% by echo in 05/2016    Peptic ulcer disease    Pneumonia    Yrs ago   Type 2 diabetes mellitus ([place])    Ureteral colic    Vertigo    tx with valium prn   Wears glasses    Wears hearing aid in both ears    Current Outpatient Medications  Medication Sig Dispense Refill   acetaminophen (TYLENOL) 650 MG CR tablet Take 650-1,300 mg by mouth every 8 (eight) hours as needed for pain.     albuterol (VENTOLIN HFA) 108 (90 Base) MCG/ACT inhaler Inhale 2 puffs into the lungs every 6 (six) hours as needed for wheezing or shortness of breath. 8 g 2   ALPRAZolam (XANAX) 0.25 MG tablet Take 0.25 mg by mouth at bedtime as needed for anxiety or sleep.     aspirin EC 81 MG tablet Take 81 mg by mouth in the morning.     atorvastatin (LIPITOR) 40 MG tablet Take 1 tablet (40 mg total) by mouth daily. 90 tablet 1   Cholecalciferol (VITAMIN D3) 125 MCG (5000 UT) TABS Take 1 tablet by mouth in the morning.     diazepam (VALIUM) 5 MG tablet Take 5 mg by mouth daily as needed (vertigo).     empagliflozin (JARDIANCE) 10 MG TABS tablet Take 10 mg by mouth daily.     ENTRESTO 24-26 MG Take 1 tablet by mouth twice daily 180 tablet 1   furosemide (LASIX) 20 MG tablet Take 1 tablet (20 mg total) by mouth daily. 90 tablet 1   guaiFENesin (MUCINEX) 600 MG 12 hr tablet Take 600 mg by mouth daily as needed (congestion.).     metoprolol succinate (TOPROL-XL) 25 MG 24 hr tablet Patient takes 0.5 tablet tablet by mouth in the morning and evening.     omeprazole (PRILOSEC) 20 MG capsule Take  20 mg by mouth daily before breakfast.     ondansetron (ZOFRAN) 4 MG tablet Take 1 tablet (4 mg total) by mouth every 6 (six) hours as needed for nausea. 20 tablet 0  repaglinide (PRANDIN) 2 MG tablet Take 2 mg by mouth 3 (three) times daily before meals.      spironolactone (ALDACTONE) 25 MG tablet Take 1/2 (one-half) tablet by mouth once daily 45 tablet 3   zinc gluconate 50 MG tablet Take 50 mg by mouth in the morning.     No current facility-administered medications for this encounter.   Allergies  Allergen Reactions   Flonase [Fluticasone]    Lisinopril Cough   Social History   Socioeconomic History   Marital status: Married    Spouse name: Not on file   Number of children: Not on file   Years of education: Not on file   Highest education level: Not on file  Occupational History   Occupation: RETIRED    Comment: [employer]   Occupation: SECURITY GUARD  Tobacco Use   Smoking status: Former    Packs/day: 2.00    Years: 17.00    Total pack years: 34.00    Types: Cigarettes    Start date: 02/17/1968    Quit date: 02/28/1986    Years since quitting: 36.3   Smokeless tobacco: Never  Vaping Use   Vaping Use: Never used  Substance and Sexual Activity   Alcohol use: No    Alcohol/week: 0.0 standard drinks of alcohol   Drug use: No   Sexual activity: Not Currently  Other Topics Concern   Not on file  Social History Narrative   Not on file   Social Determinants of Health   Financial Resource Strain: Not on file  Food Insecurity: No Food Insecurity (06/07/2022)   Hunger Vital Sign    Worried About Running Out of Food in the Last Year: Never true    [place] in the Last Year: Never true  Transportation Needs: No Transportation Needs (06/07/2022)   PRAPARE - Hydrologist (Medical): No    Lack of Transportation (Non-Medical): No  Physical Activity: Not on file  Stress: Not on file  Social Connections: Not on file  Intimate  Partner Violence: Not At Risk (06/07/2022)   Humiliation, Afraid, Rape, and Kick questionnaire    Fear of Current or Ex-Partner: No    Emotionally Abused: No    Physically Abused: No    Sexually Abused: No   Family History  Problem Relation Age of Onset   Colon cancer Other    BP 114/72   Pulse (!) 102   Wt 83.4 kg (183 lb 12.8 oz)   SpO2 97%   BMI 27.14 kg/m   Wt Readings from Last 3 Encounters:  06/24/22 83.4 kg (183 lb 12.8 oz)  06/10/22 84.6 kg (186 lb 6.4 oz)  05/23/22 85.1 kg (187 lb 9.6 oz)   PHYSICAL EXAM: General:  NAD. No resp difficulty, walked into clinic HEENT: Normal Neck: Supple. No JVD. Carotids 2+ bilat; no bruits. No lymphadenopathy or thryomegaly appreciated. Cor: PMI nondisplaced. Tachy rate & rhythm. No rubs, gallops or murmurs. Lungs: Clear Abdomen: Soft, nontender, nondistended. No hepatosplenomegaly. No bruits or masses. Good bowel sounds. Extremities: No cyanosis, clubbing, rash, edema Neuro: Alert & oriented x 3, cranial nerves grossly intact. Moves all 4 extremities w/o difficulty. Affect pleasant.  ECG (personally reviewed): ST 116 bpm  ASSESSMENT & PLAN: 1. Chronic Systolic Heart Failure - Had drop in EF in 2015, down to 25-30%, in setting of endocarditis. Also after completion of chemo for APML, though CM not felt to be due to chemo per pt report  -  Echo (2017): EF improved, 35-40%  - Echo (2020): EF 40%  - LHC 2020 1v CAD 40% pRCA>>NICM  - Echo (2021): EF 50% - Echo (7/23): EF 40%  - Echo (10/23): down further to 30-35%, RV mildly reduced.  - R/LHC (10/23) showed non obs CAD, normal left sided filling pressures and moderately reduced CO/CI. - Suspect fall in EF 2/2 progression of NICM and recent PNA. - NYHA II-early III, volume OK today. - Increase Toprol XL to 25 mg qhs. - Consider digoxin if ST persists. - Continue Lasix 20 mg daily. - Continue Entresto 26-26 mg bid. BP too soft for titration  - Continue spironolactone 12.5 mg daily   - Continue Jardiance 10 mg daily. No GU symptoms. - BMET and BNP today. - Repeat echo after GDMT optimized.   2. CAD - Cath 2020: mod- nonobstructive 1VCAD, 40% pRCA - LHC (10/23): non-obstructive CAD, 40% pRCA - No chest pain. - Continue ASA, statin + ? blocker   3. Type 2DM  - Hgb A1c 6.6  - Continue SGLT2i. No GU symptoms.  4. CAP  - Lungs clear on exam, SpO2 97%. - Completed abx.   5. APML - [facility] in 2014 - In remission.    6. H/o Endocarditis - 2015  - CLABSI ([place] cath for chemo) - Treated w/ abx  Follow up in 3 weeks with PharmD (increase spiro and change Lasix to PRN) and 2-3 months with [name] + echo.   Allena Katz, FNP-BC 06/24/22

## 2022-06-24 NOTE — Patient Instructions (Signed)
Thank you for coming in today  Labs were done today, if any labs are abnormal the clinic will call you No news is good news  INCREASE Toprol XL to 25 mg 1 tablet daily at nighttime   Your physician recommends that you schedule a follow-up appointment in:  3 weeks with Pharmacy 2-3 months with [name] with echocardiogram  Your physician has requested that you have an echocardiogram. Echocardiography is a painless test that uses sound waves to create images of your heart. It provides your doctor with information about the size and shape of your heart and how well your heart's chambers and valves are working. This procedure takes approximately one hour. There are no restrictions for this procedure.      Do the following things EVERYDAY: Weigh yourself in the morning before breakfast. Write it down and keep it in a log. Take your medicines as prescribed Eat low salt foods--Limit salt (sodium) to 2000 mg per day.  Stay as active as you can everyday Limit all fluids for the day to less than 2 liters  At the [facility], you and your health needs are our priority. As part of our continuing mission to provide you with exceptional heart care, we have created designated Provider Care Teams. These Care Teams include your primary Cardiologist (physician) and Advanced Practice Providers (APPs- Physician Assistants and Nurse Practitioners) who all work together to provide you with the care you need, when you need it.   You may see any of the following providers on your designated Care Team at your next follow up: Dr Glori Bickers Dr Loralie Champagne Dr. Roxana Hires, NP Lyda Jester, [place] [facility] [place], [place] Forestine Na, NP Audry Riles, PharmD   Please be sure to bring in all your medications bottles to every appointment.   If you have any questions or concerns before your next appointment please send [place] a message through [place] or call  our office at [phone].    TO LEAVE A MESSAGE FOR THE NURSE SELECT OPTION 2, PLEASE LEAVE A MESSAGE INCLUDING: YOUR NAME DATE OF BIRTH CALL BACK NUMBER REASON FOR CALL**this is important as we prioritize the call backs  YOU WILL RECEIVE A CALL BACK THE SAME DAY AS LONG AS YOU CALL BEFORE 4:00 PM

## 2022-06-25 ENCOUNTER — Telehealth (HOSPITAL_COMMUNITY): Payer: Self-pay

## 2022-06-25 ENCOUNTER — Other Ambulatory Visit (HOSPITAL_COMMUNITY): Payer: Self-pay

## 2022-06-25 DIAGNOSIS — I5022 Chronic systolic (congestive) heart failure: Secondary | ICD-10-CM

## 2022-06-25 NOTE — Telephone Encounter (Signed)
Patient aware and agreeable. Labs order and appt set.

## 2022-06-27 ENCOUNTER — Ambulatory Visit (HOSPITAL_BASED_OUTPATIENT_CLINIC_OR_DEPARTMENT_OTHER): Payer: Medicare PPO | Admitting: Family

## 2022-07-02 ENCOUNTER — Ambulatory Visit (HOSPITAL_COMMUNITY)
Admission: RE | Admit: 2022-07-02 | Discharge: 2022-07-02 | Disposition: A | Discharge status: Home / Self Care | Payer: Medicare PPO | Source: Ambulatory Visit | Attending: Internal Medicine | Admitting: Internal Medicine

## 2022-07-02 DIAGNOSIS — I5022 Chronic systolic (congestive) heart failure: Secondary | ICD-10-CM | POA: Insufficient documentation

## 2022-07-02 LAB — BASIC METABOLIC PANEL
Anion gap: 12 (ref 5–15)
BUN: 19 mg/dL (ref 8–23)
CO2: 19 mmol/L — ABNORMAL LOW (ref 22–32)
Calcium: 9.1 mg/dL (ref 8.9–10.3)
Chloride: 106 mmol/L (ref 98–111)
Creatinine, Ser: 1.04 mg/dL (ref 0.61–1.24)
GFR, Estimated: >60 mL/min (ref >60)
Glucose, Bld: 85 mg/dL (ref 70–99)
Potassium: 4.6 mmol/L (ref 3.5–5.1)
Sodium: 137 mmol/L (ref 135–145)

## 2022-07-07 ENCOUNTER — Ambulatory Visit (HOSPITAL_BASED_OUTPATIENT_CLINIC_OR_DEPARTMENT_OTHER): Payer: Medicare PPO | Admitting: Family

## 2022-07-07 ENCOUNTER — Encounter (HOSPITAL_BASED_OUTPATIENT_CLINIC_OR_DEPARTMENT_OTHER): Payer: Self-pay | Admitting: Family

## 2022-07-07 VITALS — BP 100/60 | HR 103 | Ht 69.0 in | Wt 186.0 lb

## 2022-07-07 DIAGNOSIS — E785 Hyperlipidemia, unspecified: Secondary | ICD-10-CM | POA: Diagnosis not present

## 2022-07-07 DIAGNOSIS — I5022 Chronic systolic (congestive) heart failure: Secondary | ICD-10-CM

## 2022-07-07 DIAGNOSIS — E1169 Type 2 diabetes mellitus with other specified complication: Secondary | ICD-10-CM | POA: Diagnosis not present

## 2022-07-07 DIAGNOSIS — I25118 Atherosclerotic heart disease of native coronary artery with other forms of angina pectoris: Secondary | ICD-10-CM

## 2022-07-07 MED ORDER — SPIRONOLACTONE 25 MG PO TABS: 25.0000 mg | ORAL_TABLET | Freq: Every day | ORAL | 3 refills | Status: DC

## 2022-07-07 NOTE — Progress Notes (Signed)
Office Visit    Patient Name: Ricardo Zuniga Date of Encounter: 07/07/2022  PCP:  Glenda Chroman, MD   [place]  Cardiologist:  [name], MD  Advanced Practice Provider:  No care team member to display Electrophysiologist:  None   Chief Complaint    Ricardo Zuniga is a 71 y.o. male presents today for hospital follow up.   Past Medical History    Past Medical History:  Diagnosis Date   Anal fissure    APML (acute promyelocytic leukemia) in remission ([place])    Completed treatment 04/2013   Coronary artery disease    Nonobstructive at cardiac catheterization 09/2018   Difficult intubation    w/shoulder surgery at surgical center between 2005-2008   Gastroesophageal reflux disease    History of blood transfusion 2014   History of kidney stones    passed stones   Hypercholesteremia    Neuropathy    bilateral feet   Nonischemic cardiomyopathy ([place])    a. EF 20% in 2015 - thought to possibly be viral induced or chemotherapy induced from treatments in 2014 b. at 35-40% by echo in 05/2016    Peptic ulcer disease    Pneumonia    Yrs ago   Type 2 diabetes mellitus ([place])    Ureteral colic    Vertigo    tx with valium prn   Wears glasses    Wears hearing aid in both ears    Past Surgical History:  Procedure Laterality Date   ANAL FISSURE REPAIR     APPENDECTOMY     BIOPSY  08/17/2020   Procedure: BIOPSY;  Surgeon: Harvel Quale, MD;  [facility];  Service: Gastroenterology;;   Wilmon Pali RELEASE Left 10/16/2017   Procedure: LEFT CARPAL TUNNEL RELEASE;  Surgeon: Earlie Server, MD;  Location: [place];  Service: Orthopedics;  Laterality: Left;   CARPAL TUNNEL RELEASE Right 11/25/2019   Procedure: CARPAL TUNNEL RELEASE;  Surgeon: Earlie Server, MD;  Location: [facility];  Service: Orthopedics;  Laterality: Right;   CATARACT EXTRACTION, BILATERAL     CERVICAL DISC ARTHROPLASTY N/A 01/05/2018   Procedure: Artificial  Cervical Disc ReplacementCervical Three-Four, Cervical Four-Five;  Surgeon: Kristeen Miss, MD;  Location: [place];  Service: Neurosurgery;  Laterality: N/A;   CERVICAL FUSION     CHOLECYSTECTOMY     COLONOSCOPY N/A 06/27/2015   Procedure: COLONOSCOPY;  Surgeon: Rogene Houston, MD;  [facility];  Service: Endoscopy;  Laterality: N/A;  730   COLONOSCOPY WITH PROPOFOL N/A 08/17/2020   Procedure: COLONOSCOPY WITH PROPOFOL;  Surgeon: Harvel Quale, MD;  [facility];  Service: Gastroenterology;  Laterality: N/A;  11:15   COLONOSCOPY WITH PROPOFOL N/A 10/23/2021   Procedure: COLONOSCOPY WITH PROPOFOL;  Surgeon: Harvel Quale, MD;  [facility];  Service: Gastroenterology;  Laterality: N/A;  9:45am   ESOPHAGOGASTRODUODENOSCOPY (EGD) WITH PROPOFOL N/A 08/17/2020   Procedure: ESOPHAGOGASTRODUODENOSCOPY (EGD) WITH PROPOFOL;  Surgeon: Harvel Quale, MD;  [facility];  Service: Gastroenterology;  Laterality: N/A;   KNEE SURGERY     x3   LEFT AND RIGHT HEART CATHETERIZATION WITH CORONARY ANGIOGRAM N/A 12/12/2013   Procedure: LEFT AND RIGHT HEART CATHETERIZATION WITH CORONARY ANGIOGRAM;  Surgeon: [name], MD;  Location: [facility];  Service: Cardiovascular;  Laterality: N/A;   LEFT HEART CATH AND CORONARY ANGIOGRAPHY N/A 09/17/2018   Procedure: LEFT HEART CATH AND CORONARY ANGIOGRAPHY;  Surgeon: Fletcher Anon,  Mertie Clause, MD;  Location: [place] [facility];  Service: Cardiovascular;  Laterality: N/A;   LUMBAR FUSION     LUMBAR LAMINECTOMY/DECOMPRESSION MICRODISCECTOMY Bilateral 01/02/2022   Procedure: Bilateral Lumbar Three-Four/Lumbar Four-Five Sublaminar decompression;  Surgeon: Kristeen Miss, MD;  Location: [place];  Service: Neurosurgery;  Laterality: Bilateral;  3C/RM 21 To Follow Dr Annette Stable   POLYPECTOMY  08/17/2020   Procedure: POLYPECTOMY;  Surgeon: Harvel Quale, MD;  [facility];  Service: Gastroenterology;;    POLYPECTOMY  10/23/2021   Procedure: POLYPECTOMY;  Surgeon: Montez Morita, Quillian Quince, MD;  [facility];  Service: Gastroenterology;;   RIGHT/LEFT HEART CATH AND CORONARY ANGIOGRAPHY N/A 06/10/2022   Procedure: RIGHT/LEFT HEART CATH AND CORONARY ANGIOGRAPHY;  Surgeon: Hebert Soho, DO;  Location: [place] [facility];  Service: Cardiovascular;  Laterality: N/A;   SHOULDER ACROMIOPLASTY Right 07/18/2015   Procedure: SHOULDER ACROMIOPLASTY;  Surgeon: Earlie Server, MD;  Location: [place];  Service: Orthopedics;  Laterality: Right;   SHOULDER ARTHROSCOPY Right 07/18/2015   Procedure: Right shoulder arthroscopy with debridement;  Surgeon: Earlie Server, MD;  Location: [place];  Service: Orthopedics;  Laterality: Right;   SHOULDER SURGERY     x3   TONSILLECTOMY      Allergies  Allergies  Allergen Reactions   Flonase [Fluticasone]    Lisinopril Cough    History of Present Illness    Ricardo Zuniga is a 71 y.o. male with a hx of nonobstructive coronary artery disease, HFrEF with nonischemic cardiomyopathy, HLD, DM2, GERD last seen 06/24/22 by heart failure clinic.   Last seen 02/13/22 by Dr. Domenic Polite recovering well from lumbar spine surgery in May. He noted NYHA I dyspnea. Updated echo ordered. Echo 02/17/22 EF 45%, RV normal size/function, trivial MR, moderate AI, mild dilation ascending aorta 36m.  Seen 05/23/22 after noting shortness of breath x 2 weeks. LLL inspiratory crackles on exam. He was provided Lasix '20mg'$  QD x 3 days and recommended for cardiac CTA.   Subsequently admitted 10/27-10/31/23 with pneumonia treated with IV abx, repeat echo LVEF 30-35% diuresed 5L. LHC with nonobstructive CAD.   Seen in Heart & Vascular TOC clinic - Toprol increased to '25mg'$  QD. Recommended for repeat visit with pharmacist in 3 weeks and in 2-3 months with Dr. SDaniel Nonesfor echocardiogram.  Via repeat labs 06/24/22 with Spironolactone increased to '25mg'$   daily.   He presents today for follow up with his wife. Notes he has good and bad moments since discharge though overall feeling better. Still will have episodes of shortness of breath even when sitting still. Still with some congested cough during the daytime. Still using Flonase, PRN inhaler. No longer using Muccinex but agreeable to resume. Reports no chest pain, orthopnea, PND, edema.   EKGs/Labs/Other Studies Reviewed:   The following studies were reviewed today:  Echo 02/17/22  1. Left ventricular ejection fraction, by estimation, is 45%. The left  ventricle has mildly decreased function. The left ventricle demonstrates  global hypokinesis. Left ventricular diastolic parameters are  indeterminate. Elevated left atrial pressure.  The average left ventricular global longitudinal strain is -11.4 %. The  global longitudinal strain is abnormal.   2. Right ventricular systolic function is normal. The right ventricular  size is normal. Tricuspid regurgitation signal is inadequate for assessing  PA pressure.   3. The mitral valve is abnormal. Trivial mitral valve regurgitation. No  evidence of mitral stenosis.   4. The aortic valve is tricuspid. There is mild calcification of  the  aortic valve. There is mild thickening of the aortic valve. Aortic valve  regurgitation is moderate. No aortic stenosis is present.   5. Aortic dilatation noted. There is mild dilatation of the ascending  aorta, measuring 37 mm.   6. The inferior vena cava is normal in size with greater than 50%  respiratory variability, suggesting right atrial pressure of 3 mmHg.   Comparison(s): Echocardiogram done 02/24/21 showed an EF of 40-45%.   EKG:  No EKG today.  Recent Labs: 11/03/2021: ALT 19; Magnesium 1.8 06/09/2022: Platelets 230 06/10/2022: Hemoglobin 12.2 06/24/2022: B Natriuretic Peptide 1,721.3 07/02/2022: BUN 19; Creatinine, Ser 1.04; Potassium 4.6; Sodium 137  Recent Lipid Panel    Component Value  Date/Time   CHOL 79 06/10/2022 0550   TRIG 60 06/10/2022 0550   HDL 27 (L) 06/10/2022 0550   CHOLHDL 2.9 06/10/2022 0550   VLDL 12 06/10/2022 0550   LDLCALC 40 06/10/2022 0550    Home Medications   Current Meds  Medication Sig   acetaminophen (TYLENOL) 650 MG CR tablet Take 650-1,300 mg by mouth every 8 (eight) hours as needed for pain.   albuterol (VENTOLIN HFA) 108 (90 Base) MCG/ACT inhaler Inhale 2 puffs into the lungs every 6 (six) hours as needed for wheezing or shortness of breath.   ALPRAZolam (XANAX) 0.25 MG tablet Take 0.25 mg by mouth at bedtime as needed for anxiety or sleep.   aspirin EC 81 MG tablet Take 81 mg by mouth in the morning.   atorvastatin (LIPITOR) 40 MG tablet Take 1 tablet (40 mg total) by mouth daily.   Cholecalciferol (VITAMIN D3) 125 MCG (5000 UT) TABS Take 1 tablet by mouth in the morning.   diazepam (VALIUM) 5 MG tablet Take 5 mg by mouth daily as needed (vertigo).   empagliflozin (JARDIANCE) 10 MG TABS tablet Take 10 mg by mouth daily.   ENTRESTO 24-26 MG Take 1 tablet by mouth twice daily   furosemide (LASIX) 20 MG tablet Take 1 tablet (20 mg total) by mouth daily.   guaiFENesin (MUCINEX) 600 MG 12 hr tablet Take 600 mg by mouth daily as needed (congestion.).   metoprolol succinate (TOPROL-XL) 25 MG 24 hr tablet Take 25 mg by mouth at bedtime.   omeprazole (PRILOSEC) 20 MG capsule Take 20 mg by mouth daily before breakfast.   ondansetron (ZOFRAN) 4 MG tablet Take 1 tablet (4 mg total) by mouth every 6 (six) hours as needed for nausea.   repaglinide (PRANDIN) 2 MG tablet Take 2 mg by mouth 3 (three) times daily before meals.    zinc gluconate 50 MG tablet Take 50 mg by mouth in the morning.   [DISCONTINUED] spironolactone (ALDACTONE) 25 MG tablet Take 25 mg by mouth daily.     Review of Systems      All other systems reviewed and are otherwise negative except as noted above.  Physical Exam    VS:  BP 100/60   Pulse (!) 103   Ht '5\' 9"'$  (1.753 m)    Wt 186 lb (84.4 kg)   BMI 27.47 kg/m  , BMI Body mass index is 27.47 kg/m.  Wt Readings from Last 3 Encounters:  07/07/22 186 lb (84.4 kg)  06/24/22 183 lb 12.8 oz (83.4 kg)  06/10/22 186 lb 6.4 oz (84.6 kg)    GEN: Well nourished, well developed, in no acute distress. HEENT: normal. Neck: Supple, no JVD, carotid bruits, or masses. Cardiac: RRR, no murmurs, rubs, or gallops. No clubbing, cyanosis, edema.  Radials/PT 2+ and equal bilaterally.  Respiratory:  Respirations regular and unlabored, clear to auscultation bilaterally.   GI: Soft, nontender, nondistended. MS: No deformity or atrophy. Skin: Warm and dry, no rash. Neuro:  Strength and sensation are intact. Psych: Normal affect.  Assessment & Plan    HFrEF - 06/08/22 LVEF 30-35%.  Grossly euvolemic on exam. GDMT - Jardiance, Entresto, Lasix, Metoprolol, Spironolactone. Refer to PREP exercise program at [facility]. Follow up as scheduled with pharmacist at [facility] clinic.   Nonobstructive CAD / HLD - LHC 05/2022 nonobstructive CAD.  Present GDMT includes aspirin, atorvastatin, metoprolol, Jardiance.  06/10/22 LDL 40.   PNA - Completed course of antibiotics. Still with congested cough, encouraged OTC Muccinex. Has completed abx.   DM2 - 06/07/22 A1c 6.6. Continue to follow with PCP.        Disposition: Follow up as scheduled with Heart and Vascular HF TOC clinic and as scheduled with [name], MD or APP.  Signed, Loel Dubonnet, NP 07/07/2022, 3:45 PM [facility]

## 2022-07-07 NOTE — Patient Instructions (Addendum)
Medication Instructions:   Recommend taking Guaifenesin (Muccinex) 12 hour extended tablet twice per day to help with congested cough.   *If you need a refill on your cardiac medications before your next appointment, please call your pharmacy*  Lab Work/Testing/Procedures: None ordered today.   Follow-Up: At [facility], you and your health needs are our priority.  As part of our continuing mission to provide you with exceptional heart care, we have created designated Provider Care Teams.  These Care Teams include your primary Cardiologist (physician) and Advanced Practice Providers (APPs -  Physician Assistants and Nurse Practitioners) who all work together to provide you with the care you need, when you need it.  We recommend signing up for the patient portal called "MyChart".  Sign up information is provided on this After Visit Summary.  MyChart is used to connect with patients for Virtual Visits (Telemedicine).  Patients are able to view lab/test results, encounter notes, upcoming appointments, etc.  Non-urgent messages can be sent to your provider as well.   To learn more about what you can do with MyChart, go to [email or web address].    Your next appointment:   As scheduled  Other Instructions  Heart Healthy Diet Recommendations: A low-salt diet is recommended. Meats should be grilled, baked, or boiled. Avoid fried foods. Focus on lean protein sources like fish or chicken with vegetables and fruits. The American Heart Association is a Microbiologist!  American Heart Association Diet and Lifeystyle Recommendations   Exercise recommendations: The American Heart Association recommends 150 minutes of moderate intensity exercise weekly. Try 30 minutes of moderate intensity exercise 4-5 times per week. This could include walking, jogging, or swimming. We have referred you to the PREP program at the [facility].  _______________________________      Provider Referral Exercise  Program (P.R.E.P.)      12 Weeks to Wellness They will call you to schedule       What is included in the PREP?  --Health Coaching and personalized exercise prescription --Full Membership to participating [facility] for the 12 weeks --Pre- and Post-consultations to assess progress and formulate an exercise plan for       continuation of exercise  Who will benefit from PREP?  People with challenges, including (but not limited to): ---Low back pain ---Arthritis ---Hypertension ---Diabetes ---Obesity ---Joint Replacement ---Neuromuscular Disorders ---Cancer Recovery ---Weight Loss ---Many others (as determined by provider)  Get Started Today! Ask your healthcare provider to fill out a Healthcare Provider Referral for Exercise form. Be sure to turn it in before you leave the office and send/fax/email it directly to the Wellness RN to schedule your Initial Consultation. If you have family or friends that would also benefit, please share this referral with them.  Contacts:  [facility] [phone]    Ricardo Zuniga, [place]  [phone]  [email or web address]

## 2022-07-10 DIAGNOSIS — I509 Heart failure, unspecified: Secondary | ICD-10-CM | POA: Diagnosis not present

## 2022-07-10 DIAGNOSIS — Z299 Encounter for prophylactic measures, unspecified: Secondary | ICD-10-CM | POA: Diagnosis not present

## 2022-07-10 DIAGNOSIS — F419 Anxiety disorder, unspecified: Secondary | ICD-10-CM | POA: Diagnosis not present

## 2022-07-10 DIAGNOSIS — I10 Essential (primary) hypertension: Secondary | ICD-10-CM | POA: Diagnosis not present

## 2022-07-10 DIAGNOSIS — Z789 Other specified health status: Secondary | ICD-10-CM | POA: Diagnosis not present

## 2022-07-10 DIAGNOSIS — E1165 Type 2 diabetes mellitus with hyperglycemia: Secondary | ICD-10-CM | POA: Diagnosis not present

## 2022-07-14 ENCOUNTER — Telehealth (HOSPITAL_BASED_OUTPATIENT_CLINIC_OR_DEPARTMENT_OTHER): Payer: Self-pay | Admitting: *Deleted

## 2022-07-14 ENCOUNTER — Ambulatory Visit (HOSPITAL_COMMUNITY)
Admission: RE | Admit: 2022-07-14 | Discharge: 2022-07-14 | Disposition: A | Discharge status: Home / Self Care | Payer: Medicare PPO | Source: Ambulatory Visit | Attending: Internal Medicine | Admitting: Internal Medicine

## 2022-07-14 ENCOUNTER — Telehealth: Payer: Self-pay | Admitting: Cardiology

## 2022-07-14 ENCOUNTER — Other Ambulatory Visit (HOSPITAL_COMMUNITY): Payer: Self-pay | Admitting: Internal Medicine

## 2022-07-14 DIAGNOSIS — I270 Primary pulmonary hypertension: Secondary | ICD-10-CM | POA: Diagnosis not present

## 2022-07-14 DIAGNOSIS — J90 Pleural effusion, not elsewhere classified: Secondary | ICD-10-CM | POA: Diagnosis not present

## 2022-07-14 DIAGNOSIS — I509 Heart failure, unspecified: Secondary | ICD-10-CM | POA: Diagnosis not present

## 2022-07-14 DIAGNOSIS — R0602 Shortness of breath: Secondary | ICD-10-CM | POA: Diagnosis not present

## 2022-07-14 DIAGNOSIS — R059 Cough, unspecified: Secondary | ICD-10-CM | POA: Diagnosis not present

## 2022-07-14 DIAGNOSIS — J189 Pneumonia, unspecified organism: Secondary | ICD-10-CM | POA: Diagnosis not present

## 2022-07-14 DIAGNOSIS — Z299 Encounter for prophylactic measures, unspecified: Secondary | ICD-10-CM | POA: Diagnosis not present

## 2022-07-14 NOTE — Telephone Encounter (Signed)
Received mychart message through appointment request  Comments: Feet and legs are swelling.  Did not sleep last night.  I am short of breath  Spoke with patient who saw his PCP today and Lasix was increased to  60 mg total today, 40 mg tomorrow and keep visit 12/8 with Gwen Her NP   Call back if any issues between now an then

## 2022-07-14 NOTE — Progress Notes (Incomplete)
***In Progress***    Advanced Heart Failure Clinic Note   Primary Care: Glenda Chroman, MD Primary Cardiologist: Dr. Domenic Polite HF Cardiologist: [name]  HPI:  71 y.o. 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 He was treated w/ abx. Per patient and wife report, his hematologist did not think that his CM was chemo-induced, and felt mostly likely triggered by endocarditis.    Repeat echo in 2017 showed interval improvement,  LVEF up to 35-40%.    Had [place] in 2020 that showed mod nonobstructive one-vessel coronary artery disease, prox RCA 40% stenosed.    Echo 02/2020 EF 50%, RV normal    Echo 02/2022 EF 45%.    Admitted 05/2022 with a/c CHF and PNA. Diuresed with IV Lasix. Echo showed EF down to 30-35%, Global HK, mildly reduced RV and mild MR. AHF team consulted. Of note, pt also w/o polyneuropathy including carpal tunnel s/p CTR and spinal stenosis. Underwent R/LHC showing mildly elevated pre-capillary filling pressures and PVR, likely secondary to chronic HFrEF, normal left sided filling pressures, moderately reduced CO and no obstructive CAD. GDMT titrated but Jardiance held with concern of ketosis on admit. He was discharged home, weight 186 lbs.   [facility]ed to [facility] post hospital HF follow up 06/24/22. He reported "good days and bad days." Felt fatigued. He had mild SOB walking on flat ground, but he could walk up a flight of stairs without dyspnea if he takes his time. He restarted his Jardiance since discharge. Denied palpitations, abnormal bleeding, CP, dizziness, edema, or PND/Orthopnea. Appetite was ok. No fever or chills. Weight at home was 175 pounds. Reported taking all medications. Retired Marketing executive. Wife is a Marine scientist.  Today he returns to HF clinic for pharmacist medication titration. At last visit with APP, metoprolol succinate was increased to 25 mg daily. Additionally, spironolactone was increased to 25 mg daily  Shortness of breath/dyspnea on exertion? {YES NO:22349}  Orthopnea/PND? {YES NO:22349} Edema? {YES NO:22349} Lightheadedness/dizziness? {YES NO:22349} Daily weights at home? {YES NO:22349} Blood pressure/heart rate monitoring at home? {YES [id number] Following low-sodium/fluid-restricted diet? {YES NO:22349}  HF Medications: Metoprolol succinate 25 mg daily Entresto 24/26 mg BID Spironolactone 25 mg daily Jardiance 10 mg daily Lasix 20 mg daily  Has the patient been experiencing any side effects to the medications prescribed?  {YES NO:22349}  Does the patient have any problems obtaining medications due to transportation or finances?   {YES NO:22349}  Understanding of regimen: {excellent/good/fair/poor:19665} Understanding of indications: {excellent/good/fair/poor:19665} Potential of compliance: {excellent/good/fair/poor:19665} Patient understands to avoid NSAIDs. Patient understands to avoid decongestants.    Pertinent Lab Values: Serum creatinine ***, BUN ***, Potassium ***, Sodium ***, BNP ***, Magnesium ***, Digoxin ***   Vital Signs: Weight: *** (last clinic weight: ***) Blood pressure: ***  Heart rate: ***   Assessment/Plan: 1. Chronic Systolic Heart Failure - Had drop in EF in 2015, down to 25-30%, in setting of endocarditis. Also after completion of chemo for APML, though CM not felt to be due to chemo per pt report  - Echo (2017): EF improved, 35-40%  - Echo (2020): EF 40%  - LHC 2020 1v CAD 40% pRCA>>NICM  - Echo (2021): EF 50% - Echo (02/2022): EF 40%  -  Echo (05/2022): down further to 30-35%, RV mildly reduced.  - R/LHC (05/2022) showed non obs CAD, normal left sided filling pressures and moderately reduced CO/CI. -  Suspect fall in EF 2/2 progression of NICM and recent PNA. - NYHA II-early III, volume OK today. - Continue Lasix 20 mg daily. - Continue metoprolol XL 25 mg qhs. - Consider digoxin if ST persists. *** - Continue Entresto 26-26 mg BID. BP too soft for titration  - Continue spironolactone 25 mg daily  - Continue Jardiance 10 mg daily. No GU symptoms. - Repeat echo after GDMT optimized.   2. CAD - Cath 2020: mod- nonobstructive 1VCAD, 40% pRCA - LHC (10/23): non-obstructive CAD, 40% pRCA - No chest pain. - Continue ASA, statin + ? blocker   3. Type 2DM  - Hgb A1c 6.6  - Continue SGLT2i. No GU symptoms.   4. CAP  - Lungs clear on exam, SpO2 97%. - Completed abx.   5. APML - Treated at [facility] in 2014 - In remission.    6. H/o Endocarditis - 2015  - CLABSI (Hickman cath for chemo) - Treated w/ abx  Follow up ***   Audry Riles, PharmD, BCPS, BCCP, CPP Heart Failure Clinic Pharmacist [phone]

## 2022-07-14 NOTE — Telephone Encounter (Signed)
Reports BLE swelling and SOB. Reports 5 lbs weight gain over night. Weight today 187 lbs, yesterday 182 lbs. Denies dizziness or chest pain. Medications reviewed. Reports taking an extra furosemide 20 mg this morning. Advised to continue an extra furosemide 20 mg daily for a few days to get weight down. Gave first available appointment to see Ricardo Zuniga on 07/18/22 '@2'$ :30 pm. Advised to continue daily weights. Advised if he develops worsening symptoms, to go to the ED for an evaluation. Verbalized understanding of plan.

## 2022-07-14 NOTE — Telephone Encounter (Signed)
Pt c/o swelling: STAT is pt has developed SOB within 24 hours  If swelling, where is the swelling located? Legs and feet  How much weight have you gained and in what time span? NA  Have you gained 3 pounds in a day or 5 pounds in a week? NA  Do you have a log of your daily weights (if so, list)? No  Are you currently taking a fluid pill? Yes  Are you currently SOB? Yes  Have you traveled recently? No Call transferred

## 2022-07-17 NOTE — Progress Notes (Unsigned)
Cardiology Office Note:    Date:  07/18/2022  ID:  Ricardo Zuniga, DOB June 17, 1951, MRN [id number]  PCP:  Glenda Chroman, MD   [place] Prov[facility] Cardiologist:  [name], MD     Referring MD: Glenda Chroman, MD   CC: [facility] and recent CHF exacerbation  History of Present Illness:    Ricardo Zuniga is a 71 y.o. male with a hx of the following:  Nonobstructive CAD HLD NICM, HFrEF T2DM GERD Hx of leukemia  Patient is a very pleasant 71 year old male with past medical history as mentioned above.  He has been followed by the heart failure clinic and has seen Dr. Domenic Polite in the past.  Echocardiogram in July 2023 revealed EF 45%, trivial MR, moderate AI, normal size and function of RV, mild dilation of ascending aorta at 37 mm.  He was seen in the office on May 23, 2022 with chief complaint of shortness of breath for 2 weeks.  On exam he presented with left lower lobe inspiratory crackles and was provided Lasix 20 mg daily x 3 days and recommended for cardiac CTA.  Had short hospital stay later that month with pneumonia and was treated with IV antibiotics.  Repeat echo revealed EF 30 to 35% and was diuresed.  Left heart cath revealed nonobstructive CAD.  At follow-up visit, Toprol was increased to 25 mg daily and was recommended with close follow-up with clinical Pharm.D. and [name].  Aldactone was increased to 25 mg daily.  Last seen by Laurann Montana, NP on July 07, 2022 for follow-up.  Said he was overall feeling better, still noted some episodes of SHOB at rest and noted some congested cough at times despite completing antibiotic therapy.  Denied any chest pain, swelling, PND/orthopnea.  He was grossly euvolemic on exam and was referred to PREP exercise program at [facility] and was scheduled to follow-up with clinical Pharm.D. at advanced hypertension clinic.  He was told to follow-up with the heart failure clinic and told to continue to follow-up with Dr.  Domenic Polite as scheduled.  He contacted our office on July 14, 2022 reporting swelling in his legs and feet and shortness of breath.  He noted 5 pound weight gain overnight.  Denied chest pain or dizziness, said he took an extra 20 mg of Lasix to improve symptoms.  He was advised to continue an extra dose of Lasix 20 mg daily for few days to get weight down and was scheduled for first available follow-up visit.  Saw his PCP.  Was told to follow-up in office on July 18, 2022.  Today he presents for follow-up for swelling and shortness of breath.  He presents today with his wife who is a retired Therapist, sports. States he was seen at PCP office Monday and had labwork done. proBNP was 6,149. Had worsening SHOB and bilateral lower extremity swelling (R>L), has been taking 40 mg of Lasix daily the rest of this week with an additional 20 mg of Lasix Wednesday of this week. BLE swelling improved. Wife says he does get short of breath even with talking at rest. Does admit to PND but denies orthopnea, sleeps only on 1 pillow. Denies any palpitations, syncope, presyncope, dizziness, acute bleeding, or claudication. Denies any anginal symptoms, but does think he has indigestion. Currently is being treated still for PNA and is taking a course of ABX. Denies any other questions or other concerns today.    Past Medical History:  Diagnosis Date   Anal  fissure    APML (acute promyelocytic leukemia) in remission [facility])    Completed treatment 04/2013   Coronary artery disease    Nonobstructive at cardiac catheterization 09/2018   Difficult intubation    w/shoulder surgery at surgical center between 2005-2008   Gastroesophageal reflux disease    History of blood transfusion 2014   History of kidney stones    passed stones   Hypercholesteremia    Neuropathy    bilateral feet   Nonischemic cardiomyopathy ([place])    a. EF 20% in 2015 - thought to possibly be viral induced or chemotherapy induced from treatments in 2014 b. at  35-40% by echo in 05/2016    Peptic ulcer disease    Pneumonia    Yrs ago   Type 2 diabetes mellitus ([place])    Ureteral colic    Vertigo    tx with valium prn   Wears glasses    Wears hearing aid in both ears     Past Surgical History:  Procedure Laterality Date   ANAL FISSURE REPAIR     APPENDECTOMY     BIOPSY  08/17/2020   Procedure: BIOPSY;  Surgeon: Harvel Quale, MD;  [facility];  Service: Gastroenterology;;   Wilmon Pali RELEASE Left 10/16/2017   Procedure: LEFT CARPAL TUNNEL RELEASE;  Surgeon: Earlie Server, MD;  Location: [place];  Service: Orthopedics;  Laterality: Left;   CARPAL TUNNEL RELEASE Right 11/25/2019   Procedure: CARPAL TUNNEL RELEASE;  Surgeon: Earlie Server, MD;  Location: [facility];  Service: Orthopedics;  Laterality: Right;   CATARACT EXTRACTION, BILATERAL     CERVICAL DISC ARTHROPLASTY N/A 01/05/2018   Procedure: Artificial Cervical Disc ReplacementCervical Three-Four, Cervical Four-Five;  Surgeon: Kristeen Miss, MD;  Location: [place];  Service: Neurosurgery;  Laterality: N/A;   CERVICAL FUSION     CHOLECYSTECTOMY     COLONOSCOPY N/A 06/27/2015   Procedure: COLONOSCOPY;  Surgeon: Rogene Houston, MD;  [facility];  Service: Endoscopy;  Laterality: N/A;  730   COLONOSCOPY WITH PROPOFOL N/A 08/17/2020   Procedure: COLONOSCOPY WITH PROPOFOL;  Surgeon: Harvel Quale, MD;  [facility];  Service: Gastroenterology;  Laterality: N/A;  11:15   COLONOSCOPY WITH PROPOFOL N/A 10/23/2021   Procedure: COLONOSCOPY WITH PROPOFOL;  Surgeon: Harvel Quale, MD;  [facility];  Service: Gastroenterology;  Laterality: N/A;  9:45am   ESOPHAGOGASTRODUODENOSCOPY (EGD) WITH PROPOFOL N/A 08/17/2020   Procedure: ESOPHAGOGASTRODUODENOSCOPY (EGD) WITH PROPOFOL;  Surgeon: Harvel Quale, MD;  [facility];  Service: Gastroenterology;  Laterality: N/A;   KNEE SURGERY     x3   LEFT AND RIGHT  HEART CATHETERIZATION WITH CORONARY ANGIOGRAM N/A 12/12/2013   Procedure: LEFT AND RIGHT HEART CATHETERIZATION WITH CORONARY ANGIOGRAM;  Surgeon: [name], MD;  Location: [facility] C[facility];  Service: Cardiovascular;  Laterality: N/A;   LEFT HEART CATH AND CORONARY ANGIOGRAPHY N/A 09/17/2018   Procedure: LEFT HEART CATH AND CORONARY ANGIOGRAPHY;  Surgeon: Wellington Hampshire, MD;  Location: [facility];  Service: Cardiovascular;  Laterality: N/A;   LUMBAR FUSION     LUMBAR LAMINECTOMY/DECOMPRESSION MICRODISCECTOMY Bilateral 01/02/2022   Procedure: Bilateral Lumbar Three-Four/Lumbar Four-Five Sublaminar decompression;  Surgeon: Kristeen Miss, MD;  Location: [place];  Service: Neurosurgery;  Laterality: Bilateral;  3C/RM 21 To Follow Dr Annette Stable   POLYPECTOMY  08/17/2020   Procedure: POLYPECTOMY;  Surgeon: Harvel Quale, MD;  [facility];  Service: Gastroenterology;;   POLYPECTOMY  10/23/2021   Procedure: POLYPECTOMY;  Surgeon: Montez Morita, Quillian Quince, MD;  [facility];  Service: Gastroenterology;;   RIGHT/LEFT HEART CATH AND CORONARY ANGIOGRAPHY N/A 06/10/2022   Procedure: RIGHT/LEFT HEART CATH AND CORONARY ANGIOGRAPHY;  Surgeon: Hebert Soho, DO;  Location: [place] [facility];  Service: Cardiovascular;  Laterality: N/A;   SHOULDER ACROMIOPLASTY Right 07/18/2015   Procedure: SHOULDER ACROMIOPLASTY;  Surgeon: Earlie Server, MD;  Location: [place];  Service: Orthopedics;  Laterality: Right;   SHOULDER ARTHROSCOPY Right 07/18/2015   Procedure: Right shoulder arthroscopy with debridement;  Surgeon: Earlie Server, MD;  Location: [place];  Service: Orthopedics;  Laterality: Right;   SHOULDER SURGERY     x3   TONSILLECTOMY      Current Medications: Current Meds  Medication Sig   acetaminophen (TYLENOL) 650 MG CR tablet Take 650-1,300 mg by mouth every 8 (eight) hours as needed for pain.   albuterol (VENTOLIN HFA) 108 (90 Base)  MCG/ACT inhaler Inhale 2 puffs into the lungs every 6 (six) hours as needed for wheezing or shortness of breath.   ALPRAZolam (XANAX) 0.25 MG tablet Take 0.25 mg by mouth at bedtime as needed for anxiety or sleep.   aspirin EC 81 MG tablet Take 81 mg by mouth in the morning.   atorvastatin (LIPITOR) 40 MG tablet Take 1 tablet (40 mg total) by mouth daily.   Cholecalciferol (VITAMIN D3) 125 MCG (5000 UT) TABS Take 1 tablet by mouth in the morning.   diazepam (VALIUM) 5 MG tablet Take 5 mg by mouth daily as needed (vertigo).   empagliflozin (JARDIANCE) 10 MG TABS tablet Take 10 mg by mouth daily.   ENTRESTO 24-26 MG Take 1 tablet by mouth twice daily   furosemide (LASIX) 20 MG tablet Take 1 tablet (20 mg total) by mouth daily.   guaiFENesin (MUCINEX) 600 MG 12 hr tablet Take 600 mg by mouth daily as needed (congestion.).   metoprolol succinate (TOPROL-XL) 25 MG 24 hr tablet Take 25 mg by mouth at bedtime.   omeprazole (PRILOSEC) 20 MG capsule Take 20 mg by mouth daily before breakfast.   ondansetron (ZOFRAN) 4 MG tablet Take 1 tablet (4 mg total) by mouth every 6 (six) hours as needed for nausea.   repaglinide (PRANDIN) 2 MG tablet Take 2 mg by mouth 3 (three) times daily before meals.    spironolactone (ALDACTONE) 25 MG tablet Take 1 tablet (25 mg total) by mouth daily.   zinc gluconate 50 MG tablet Take 50 mg by mouth in the morning.     Allergies:   Lisinopril   Social History   Socioeconomic History   Marital status: Married    Spouse name: Not on file   Number of children: Not on file   Years of education: Not on file   Highest education level: Not on file  Occupational History   Occupation: RETIRED    Comment: [employer]   Occupation: SECURITY GUARD  Tobacco Use   Smoking status: Former    Packs/day: 2.00    Years: 17.00    Total pack years: 34.00    Types: Cigarettes    Start date: 02/17/1968    Quit date: 02/28/1986    Years since quitting: 36.4   Smokeless tobacco:  Never  Vaping Use   Vaping Use: Never used  Substance and Sexual Activity   Alcohol use: No    Alcohol/week: 0.0 standard drinks of alcohol   Drug use: No  Sexual activity: Not Currently  Other Topics Concern   Not on file  Social History Narrative   Not on file   Social Determinants of Health   Financial Resource Strain: Not on file  Food Insecurity: No Food Insecurity (06/07/2022)   Hunger Vital Sign    Worried About Running Out of Food in the Last Year: Never true    Ran Out of Food in the Last Year: Never true  Transportation Needs: No Transportation Needs (06/07/2022)   PRAPARE - Hydrologist (Medical): No    Lack of Transportation (Non-Medical): No  Physical Activity: Not on file  Stress: Not on file  Social Connections: Not on file     Family History: The patient's family history includes Colon cancer in an other family member.  ROS:   Review of Systems  Constitutional: Negative.   HENT: Negative.    Eyes: Negative.   Respiratory:  Positive for shortness of breath. Negative for cough, hemoptysis, sputum production and wheezing.   Cardiovascular: Negative.        Recent episode of lower extremity swelling; improved per his report.   Gastrointestinal:  Positive for heartburn. Negative for abdominal pain, blood in stool, constipation, diarrhea, melena, nausea and vomiting.  Genitourinary: Negative.   Musculoskeletal: Negative.   Skin: Negative.   Neurological: Negative.   Endo/Heme/Allergies: Negative.   Psychiatric/Behavioral: Negative.      Please see the history of present illness.    All other systems reviewed and are negative.  EKGs/Labs/Other Studies Reviewed:    The following studies were reviewed today:   EKG:  EKG is not ordered today. EKG dated 06/24/22 reviewed that revealed ST, nonspecific ST abnormality; otherwise nothing acute.   Right and left heart cath on 06/10/22:   Prox RCA lesion is 40% stenosed.    HEMODYNAMICS: RA:                  8 mmHg (mean) RV:                  56/5-8 mmHg PA:                  52/25 mmHg (34 mean) PCWP:            14 mmHg (mean)                                      Estimated Fick CO/CI   4.3 L/min, 2.1 L/min/m2                                      TPG                 20  mmHg                                            PVR                 4.6 Wood Units  PAPi                3.4       IMPRESSION: 1.           Mildly elevated pre-capillary  filling pressures and PVR likely secondary to chronic HFrEF 2.           Normal left sided filling pressures after IV diuresis.  3.           Moderately reduced cardiac index 4.           No obstructive coronary artery disease.      2D Echocardiogram on 06/08/22:  1. Left ventricular ejection fraction, by estimation, is 30 to 35%. The  left ventricle has moderately decreased function. The left ventricle  demonstrates global hypokinesis. The left ventricular internal cavity size  was moderately dilated. Left  ventricular diastolic parameters are indeterminate.   2. Right ventricular systolic function is mildly reduced. The right  ventricular size is mildly enlarged.   3. Left atrial size was mildly dilated.   4. The mitral valve is abnormal. Mild mitral valve regurgitation. No  evidence of mitral stenosis.   5. The aortic valve is tricuspid. There is mild calcification of the  aortic valve. There is mild thickening of the aortic valve. Aortic valve  regurgitation is mild. Aortic valve sclerosis/calcification is present,  without any evidence of aortic  stenosis.   6. The inferior vena cava is dilated in size with >50% respiratory  variability, suggesting right atrial pressure of 8 mmHg.  Left heart cath on 09/17/18: There is moderate to severe left ventricular systolic dysfunction. LV end diastolic pressure is normal. Prox RCA lesion is 40% stenosed.   1.  Moderate nonobstructive one-vessel coronary artery  disease.  The RCA stenosis actually appears to be better than most recent cardiac catheterization in 2015. 2.  Moderately severely reduced LV systolic function with an EF of 30%.  Global hypokinesis. 3.  Mild left ventricular end-diastolic pressure.   Recommendations: The patient has nonischemic cardiomyopathy for which I recommend continuing medical therapy.  He appears to be euvolemic.  Consider switching losartan to Entresto and adding spironolactone.  Lexiscan on 08/30/2018: Nonspecific T wave abnormalities in inferolateral leads throughout study. There was no ST segment deviation noted during stress. Defect 1: There is a large defect of moderate severity present in the mid anterior, mid anteroseptal, mid inferoseptal, mid inferior, mid inferolateral, apical anterior and apical inferior location. Findings consistent with prior myocardial infarction with a mild to moderate degree of peri-infarct ischemia. This is a high risk study. Nuclear stress EF: 34%.   Recent Labs: 11/03/2021: ALT 19; Magnesium 1.8 06/09/2022: Platelets 230 06/10/2022: Hemoglobin 12.2 06/24/2022: B Natriuretic Peptide 1,721.3 07/02/2022: BUN 19; Creatinine, Ser 1.04; Potassium 4.6; Sodium 137  Recent Lipid Panel    Component Value Date/Time   CHOL 79 06/10/2022 0550   TRIG 60 06/10/2022 0550   HDL 27 (L) 06/10/2022 0550   CHOLHDL 2.9 06/10/2022 0550   VLDL 12 06/10/2022 0550   LDLCALC 40 06/10/2022 0550     Risk Assessment/Calculations:    STOP-Bang Score:  2  {  Physical Exam:    VS:  BP 110/70   Pulse 96   Ht '5\' 9"'$  (1.753 m)   Wt 187 lb 6.4 oz (85 kg)   SpO2 97%   BMI 27.67 kg/m     Wt Readings from Last 3 Encounters:  07/18/22 187 lb 6.4 oz (85 kg)  07/07/22 186 lb (84.4 kg)  06/24/22 183 lb 12.8 oz (83.4 kg)    GEN: Well nourished, well developed 71 y.o. Caucasian male in no acute distress HEENT: Normal NECK: No JVD; No carotid bruits CARDIAC: S1/S2, RRR, no murmurs,  rubs, gallops; 2+  peripheral pulses throughout, strong and equal bilaterally RESPIRATORY:  Clear and diminished to auscultation without rales, wheezing or rhonchi  MUSCULOSKELETAL:  No edema; No deformity  SKIN: Warm and dry NEUROLOGIC:  Alert and oriented x 3 PSYCHIATRIC:  Normal, pleasant affect   ASSESSMENT:    1. Chronic systolic heart failure ([place])   2. Medication management   3. Coronary artery disease involving native heart without angina pectoris, unspecified vessel or lesion type   4. Hyperlipidemia, unspecified hyperlipidemia type   5. Shortness of breath    PLAN:    In order of problems listed above:  HFrEF, NICM, medication management Recent symptoms of CHF exacerbation, with improvement in lower extremity swelling; however still having some SHOB. Echo in 05/2022 revealed EF 30-35%, overall euvolemic and well compensated on exam. Recent Pro-BNP - 6,149. BLE swelling has improved while on Lasix 40 mg daily. Will continue Lasix 40 mg daily and may take an additional 20 mg of Lasix daily PRN if weight gain of more than 3 pounds in a day or more than 5 pounds in a week. Repeat BMET and pro-BNP this upcoming Monday.  Continue Entresto, Jardiance, Toprol-XL, and spironolactone. If kidney function stable with next set of labs, plan to increase Entresto to 49/51 mg BID. Low sodium diet, fluid restriction <2L, and daily weights encouraged. Educated to contact our office for weight gain of 2 lbs overnight or 5 lbs in one week. Heart healthy diet and regular cardiovascular exercise encouraged.   Nonobstructive CAD Stable with no anginal symptoms. No indication for ischemic evaluation.  EKG today did not show any acute ischemic changes. Continue aspirin, Lipitor, and Toprol-XL. Heart healthy diet and regular cardiovascular exercise encouraged.   HLD Labs from October 2023 revealed total cholesterol 79, HDL 27, LDL 40, and triglycerides 60.  Continue Lipitor. Heart healthy diet and regular cardiovascular  exercise encouraged.   Shortness of breath Most likely due to recent acute exacerbation of CHF and recent PNA. Getting labs next Monday as mentioned above and increasing Lasix to 40 mg daily as mentioned above (see #1). Low likelihood of OSA. Recommended to use incentive spirometer regularly. Planning to up titrate GDMT for hx of HFrEF. If no improvement in [facility] after tx for PNA and CHF, plan to refer to pulmonology. Has had past hx of COVID and recent episode of PNA.   Disposition: Follow-up with me or APP in 2 weeks or sooner if anything changes.    Medication Adjustments/Labs and Tests Ordered: Current medicines are reviewed at length with the patient today.  Concerns regarding medicines are outlined above.  Orders Placed This Encounter  Procedures   Basic metabolic panel   Brain natriuretic peptide   Meds ordered this encounter  Medications   furosemide (LASIX) 20 MG tablet    Sig: Take 2 tablets (40 mg total) by mouth daily. May take an additional 20 mg daily as needed for weight gain of 3 lbs/day or 5 lbs/week    Dispense:  200 tablet    Refill:  1    Patient Instructions  Medication Instructions:  Your physician has recommended you make the following change in your medication:  Stay on lasix 40 mg daily. You may take an additional 20 mg daily as needed for weight gain of 3 lbs/day or 5 lbs/week Continue other medications the same  Labwork: BMET & BNP 07/21/2022 at [place] Non-fasting  Testing/Procedures: none  Follow-Up: Your physician recommends that you schedule a follow-up  appointment in: 2 weeks  Any Other Special Instructions Will Be Listed Below (If Applicable). Please follow information given to you for [place] diet  If you need a refill on your cardiac medications before your next appointment, please call your pharmacy.   Signed, Finis Bud, NP  07/20/2022 2:25 PM    [place]

## 2022-07-18 ENCOUNTER — Encounter: Payer: Self-pay | Admitting: Nurse Practitioner

## 2022-07-18 ENCOUNTER — Ambulatory Visit: Payer: Medicare PPO | Attending: Nurse Practitioner | Admitting: Nurse Practitioner

## 2022-07-18 VITALS — BP 110/70 | HR 96 | Ht 69.0 in | Wt 187.4 lb

## 2022-07-18 DIAGNOSIS — R0602 Shortness of breath: Secondary | ICD-10-CM

## 2022-07-18 DIAGNOSIS — I5022 Chronic systolic (congestive) heart failure: Secondary | ICD-10-CM

## 2022-07-18 DIAGNOSIS — Z79899 Other long term (current) drug therapy: Secondary | ICD-10-CM

## 2022-07-18 DIAGNOSIS — I2510 Atherosclerotic heart disease of native coronary artery without angina pectoris: Secondary | ICD-10-CM | POA: Diagnosis not present

## 2022-07-18 DIAGNOSIS — E785 Hyperlipidemia, unspecified: Secondary | ICD-10-CM | POA: Diagnosis not present

## 2022-07-18 MED ORDER — FUROSEMIDE 20 MG PO TABS: 40.0000 mg | ORAL_TABLET | Freq: Every day | ORAL | 1 refills | Status: DC

## 2022-07-18 NOTE — Patient Instructions (Addendum)
Medication Instructions:  Your physician has recommended you make the following change in your medication:  Stay on lasix 40 mg daily. You may take an additional 20 mg daily as needed for weight gain of 3 lbs/day or 5 lbs/week Continue other medications the same  Labwork: BMET & BNP 07/21/2022 at [place] Non-fasting  Testing/Procedures: none  Follow-Up: Your physician recommends that you schedule a follow-up appointment in: 2 weeks  Any Other Special Instructions Will Be Listed Below (If Applicable). Please follow information given to you for [place] diet  If you need a refill on your cardiac medications before your next appointment, please call your pharmacy.

## 2022-07-20 ENCOUNTER — Encounter (HOSPITAL_COMMUNITY): Payer: Self-pay | Admitting: *Deleted

## 2022-07-20 ENCOUNTER — Other Ambulatory Visit: Payer: Self-pay

## 2022-07-20 ENCOUNTER — Emergency Department (HOSPITAL_COMMUNITY): Payer: Medicare PPO

## 2022-07-20 ENCOUNTER — Emergency Department (HOSPITAL_COMMUNITY)
Admission: EM | Admit: 2022-07-20 | Discharge: 2022-07-21 | Disposition: A | Discharge status: Home / Self Care | Payer: Medicare PPO | Attending: Emergency Medicine | Admitting: Emergency Medicine

## 2022-07-20 DIAGNOSIS — Z79899 Other long term (current) drug therapy: Secondary | ICD-10-CM | POA: Insufficient documentation

## 2022-07-20 DIAGNOSIS — E119 Type 2 diabetes mellitus without complications: Secondary | ICD-10-CM | POA: Insufficient documentation

## 2022-07-20 DIAGNOSIS — Z7982 Long term (current) use of aspirin: Secondary | ICD-10-CM | POA: Insufficient documentation

## 2022-07-20 DIAGNOSIS — I10 Essential (primary) hypertension: Secondary | ICD-10-CM | POA: Diagnosis not present

## 2022-07-20 DIAGNOSIS — R000 Tachycardia, unspecified: Secondary | ICD-10-CM | POA: Diagnosis not present

## 2022-07-20 DIAGNOSIS — I5022 Chronic systolic (congestive) heart failure: Secondary | ICD-10-CM | POA: Insufficient documentation

## 2022-07-20 DIAGNOSIS — R079 Chest pain, unspecified: Secondary | ICD-10-CM

## 2022-07-20 DIAGNOSIS — R0789 Other chest pain: Secondary | ICD-10-CM | POA: Diagnosis not present

## 2022-07-20 LAB — BASIC METABOLIC PANEL
Anion gap: 11 (ref 5–15)
BUN: 23 mg/dL (ref 8–23)
CO2: 21 mmol/L — ABNORMAL LOW (ref 22–32)
Calcium: 9.2 mg/dL (ref 8.9–10.3)
Chloride: 105 mmol/L (ref 98–111)
Creatinine, Ser: 1.12 mg/dL (ref 0.61–1.24)
GFR, Estimated: >60 mL/min (ref >60)
Glucose, Bld: 222 mg/dL — ABNORMAL HIGH (ref 70–99)
Potassium: 4 mmol/L (ref 3.5–5.1)
Sodium: 137 mmol/L (ref 135–145)

## 2022-07-20 LAB — CBC
HCT: 40.9 % (ref 39.0–52.0)
Hemoglobin: 13.7 g/dL (ref 13.0–17.0)
MCH: 31 pg (ref 26.0–34.0)
MCHC: 33.5 g/dL (ref 30.0–36.0)
MCV: 92.5 fL (ref 80.0–100.0)
Platelets: 234 10*3/uL (ref 150–400)
RBC: 4.42 MIL/uL (ref 4.22–5.81)
RDW: 15.2 % (ref 11.5–15.5)
WBC: 4.7 10*3/uL (ref 4.0–10.5)
nRBC: 0 % (ref 0.0–0.2)

## 2022-07-20 LAB — BRAIN NATRIURETIC PEPTIDE: B Natriuretic Peptide: 1764 pg/mL — ABNORMAL HIGH (ref 0.0–100.0)

## 2022-07-20 LAB — TROPONIN I (HIGH SENSITIVITY)
Troponin I (High Sensitivity): 21 ng/L — ABNORMAL HIGH (ref <18)
Troponin I (High Sensitivity): 23 ng/L — ABNORMAL HIGH (ref <18)

## 2022-07-20 LAB — PHOSPHORUS: Phosphorus: 3.7 mg/dL (ref 2.5–4.6)

## 2022-07-20 LAB — D-DIMER, QUANTITATIVE: D-Dimer, Quant: 0.45 ug/mL-FEU (ref 0.00–0.50)

## 2022-07-20 LAB — MAGNESIUM: Magnesium: 2.2 mg/dL (ref 1.7–2.4)

## 2022-07-20 NOTE — ED Provider Notes (Signed)
[facility] EMERGENCY DEPARTMENT Provider Note   CSN: [id number] Arrival date & time: 07/20/22  1901     History  Chief Complaint  Patient presents with   Chest Pain    Ricardo Zuniga is a 71 y.o. male.  Patient is a 71 year old male with a past medical history of CHF, recent prolonged course of pneumonia, diabetes and hypertension presenting to the emergency department with chest pain.  Patient states that he was at rest around 6:30 PM when he started to feel some chest pressure and felt like his heart was racing.  He states that he checked his heart on 3 different monitors and his heart rate was between the 140s to the 160s.  He states that he is dealt with chronic shortness of breath for the past several months with his pneumonia and had no worsening shortness of breath than usual at this time.  He states that he has had some improvement of his lower extremity swelling as he recently increased his Lasix and has been increasing his heart failure medications.  He did any recent nausea, vomiting or diarrhea.  He states that he was last hospitalized for his pneumonia just over a month ago.  The history is provided by the patient and the spouse.  Chest Pain      Home Medications Prior to Admission medications   Medication Sig Start Date End Date Taking? Authorizing Provider  acetaminophen (TYLENOL) 650 MG CR tablet Take 650-1,300 mg by mouth every 8 (eight) hours as needed for pain.    [provider]  albuterol (VENTOLIN HFA) 108 (90 Base) MCG/ACT inhaler Inhale 2 puffs into the lungs every 6 (six) hours as needed for wheezing or shortness of breath. 05/23/22   Loel Dubonnet, NP  ALPRAZolam Duanne Moron) 0.25 MG tablet Take 0.25 mg by mouth at bedtime as needed for anxiety or sleep. 05/13/22   [provider]  aspirin EC 81 MG tablet Take 81 mg by mouth in the morning.    [provider]  atorvastatin (LIPITOR) 40 MG tablet Take 1 tablet (40 mg total) by  mouth daily. 03/21/22   Satira Sark, MD  Cholecalciferol (VITAMIN D3) 125 MCG (5000 UT) TABS Take 1 tablet by mouth in the morning.    [provider]  diazepam (VALIUM) 5 MG tablet Take 5 mg by mouth daily as needed (vertigo). 10/17/21   [provider]  empagliflozin (JARDIANCE) 10 MG TABS tablet Take 10 mg by mouth daily.    [provider]  ENTRESTO 24-26 MG Take 1 tablet by mouth twice daily 02/24/22   Satira Sark, MD  furosemide (LASIX) 20 MG tablet Take 2 tablets (40 mg total) by mouth daily. May take an additional 20 mg daily as needed for weight gain of 3 lbs/day or 5 lbs/week 07/18/22   Finis Bud, NP  guaiFENesin (MUCINEX) 600 MG 12 hr tablet Take 600 mg by mouth daily as needed (congestion.).    [provider]  metoprolol succinate (TOPROL-XL) 25 MG 24 hr tablet Take 25 mg by mouth at bedtime.    [provider]  omeprazole (PRILOSEC) 20 MG capsule Take 20 mg by mouth daily before breakfast.    [provider]  ondansetron (ZOFRAN) 4 MG tablet Take 1 tablet (4 mg total) by mouth every 6 (six) hours as needed for nausea. 11/03/21   Raiford Noble Latif, DO  repaglinide (PRANDIN) 2 MG tablet Take 2 mg by mouth 3 (three) times daily  before meals.  07/06/17   [provider]  spironolactone (ALDACTONE) 25 MG tablet Take 1 tablet (25 mg total) by mouth daily. 07/07/22   Loel Dubonnet, NP  zinc gluconate 50 MG tablet Take 50 mg by mouth in the morning.    [provider]      Allergies    Lisinopril    Review of Systems   Review of Systems  Cardiovascular:  Positive for chest pain.    Physical Exam Updated Vital Signs BP 104/70   Pulse 88   Temp 98.1 F (36.7 C) (Oral)   Resp (!) 21   Ht '5\' 9"'$  (1.753 m)   Wt 82.1 kg   SpO2 98%   BMI 26.73 kg/m  Physical Exam Vitals and nursing note reviewed.  Constitutional:      General: He is not in acute distress.    Appearance: He is  well-developed.  HENT:     Head: Normocephalic and atraumatic.  Eyes:     Extraocular Movements: Extraocular movements intact.  Neck:     Vascular: No JVD.  Cardiovascular:     Rate and Rhythm: Regular rhythm. Tachycardia present.     Pulses:          Radial pulses are 2+ on the right side and 2+ on the left side.       Dorsalis pedis pulses are 2+ on the right side and 2+ on the left side.     Heart sounds: Normal heart sounds.  Pulmonary:     Effort: Pulmonary effort is normal.     Breath sounds: Examination of the left-lower field reveals rales. Rales present.  Abdominal:     Palpations: Abdomen is soft.     Tenderness: There is no abdominal tenderness.  Musculoskeletal:        General: Normal range of motion.     Cervical back: Normal range of motion and neck supple.     Right lower leg: No edema.     Left lower leg: No edema.  Skin:    General: Skin is warm and dry.  Neurological:     General: No focal deficit present.     Mental Status: He is alert and oriented to person, place, and time.  Psychiatric:        Mood and Affect: Mood normal.        Behavior: Behavior normal.     ED Results / Procedures / Treatments   Labs (all labs ordered are listed, but only abnormal results are displayed) Labs Reviewed  BASIC METABOLIC PANEL - Abnormal; Notable for the following components:      Result Value   CO2 21 (*)    Glucose, Bld 222 (*)    All other components within normal limits  BRAIN NATRIURETIC PEPTIDE - Abnormal; Notable for the following components:   B Natriuretic Peptide 1,764.0 (*)    All other components within normal limits  TROPONIN I (HIGH SENSITIVITY) - Abnormal; Notable for the following components:   Troponin I (High Sensitivity) 21 (*)    All other components within normal limits  TROPONIN I (HIGH SENSITIVITY) - Abnormal; Notable for the following components:   Troponin I (High Sensitivity) 23 (*)    All other components within normal limits  CBC   MAGNESIUM  PHOSPHORUS  D-DIMER, QUANTITATIVE    EKG EKG Interpretation  Date/Time:  Sunday July 20 2022 23:36:20 EST Ventricular Rate:  88 PR Interval:  183 QRS Duration: 113 QT Interval:  355 QTC Calculation: 430 R Axis:   77 Text Interpretation: Sinus rhythm Probable left atrial enlargement Borderline intraventricular conduction delay Nonspecific T abnormalities, lateral leads No significant change since last tracing Confirmed by Leanord Asal ([id number]) on 07/20/2022 11:42:24 PM  Radiology DG Chest 2 View  Result Date: 07/20/2022 CLINICAL DATA:  Chest pain EXAM: CHEST - 2 VIEW COMPARISON:  CXR 07/14/22 FINDINGS: No pleural effusion. No pneumothorax. Unchanged cardiac and mediastinal contours. No displaced rib fractures. Visualized upper abdomen is unremarkable. Unchanged prominent bilateral interstitial opacities compared to 07/14/2022. No focal opacity seen in the right upper lung correlate for findings on prior CT chest dated 06/06/2022. Vertebral body heights are maintained. Surgical clips in the right quadrant. IMPRESSION: No new radiographic finding to explain chest pain or shortness of breath. Unchanged prominent bilateral interstitial opacities compared to 07/14/2022. No focal opacity seen in the right upper lung correlate for findings on prior CT chest dated 06/06/2022. Electronically Signed   By: Marin Roberts M.D.   On: 07/20/2022 19:52    Procedures Procedures    Medications Ordered in ED Medications - No data to display  ED Course/ Medical Decision Making/ A&P Clinical Course as of 07/21/22 0001  Sun Jul 20, 2022  2318 Troponin's are flat and patient is asymptomatic. Offered a dose of IV lasix here with elevated BNP and unchanged CXR from Monday, however patient reports he has cardiology follow up tomorrow and would prefer continued outpatient management. Patient is stable for discharge with close outpatient follow up. [VK]    Clinical Course User Index [VK]  Kemper Durie, DO                           Medical Decision Making This patient presents to the ED with chief complaint(s) of chest pain with pertinent past medical history of CHF, HTN, DM which further complicates the presenting complaint. The complaint involves an extensive differential diagnosis and also carries with it a high risk of complications and morbidity.    The differential diagnosis includes ACS, arrhythmia, electrolyte abnormality, anemia, considering PE with patient's recent hospitalization, worsening pneumonia, pleural effusion, pneumothorax, pulmonary edema, patient does not appear significantly volume overloaded on exam  Additional history obtained: Additional history obtained from spouse Records reviewed recent cardiology records  ED Course and Reassessment: Patient was mildly tachycardic in the 100s and a normal rhythm on his arrival and initial EKG was nonischemic.  He will have labs performed and will be continued to be closely monitored on the telemetry monitor.  Because the patient's chest pain started just prior to arrival he will require 2 troponins.  Independent labs interpretation:  The following labs were independently interpreted: Stable BNP from prior, mildly elevated troponin  Independent visualization of imaging: - I independently visualized the following imaging with scope of interpretation limited to determining acute life threatening conditions related to emergency care: Chest x-ray, which revealed no acute changes  Consultation: - Consulted or discussed management/test interpretation w/ external professional: N/A  Consideration for admission or further workup: Patient has no emergent conditions requiring admission or further work-up at this time and is stable for discharge home with primary care follow-up  Social Determinants of health: N/A    Amount and/or Complexity of Data Reviewed Labs: ordered. Radiology:  ordered.          Final Clinical Impression(s) / ED Diagnoses Final diagnoses:  Nonspecific chest pain  Tachycardia    Rx / DC Orders ED  Discharge Orders     None         Kemper Durie, [place] 07/21/22 0001

## 2022-07-20 NOTE — Discharge Instructions (Addendum)
You were seen in the emergency department for your chest pain.  Your workup showed no signs of heart attack and no change to your pneumonia.  Kidney function was normal here with a creatinine of 1.12.  He remained in a normal sinus rhythm while you are here.  You should follow-up with your cardiologist to have your symptoms rechecked and you may benefit from a short-term heart monitor to determine why your heart is intermittently going into a fast rhythm.  You should return to the emergency department if your heart rate is high and does not go back to normal on its own, you have worsening chest pain, you pass out or if you have any other new or concerning symptoms.

## 2022-07-20 NOTE — ED Notes (Signed)
Ambulatory to tx room  

## 2022-07-20 NOTE — ED Triage Notes (Signed)
Pt generalized chest pain started about 30 minutes ago, elevated HR 145-165 PTA.  +SOB pt with hx of CHF

## 2022-07-21 ENCOUNTER — Other Ambulatory Visit: Payer: Self-pay | Admitting: *Deleted

## 2022-07-21 ENCOUNTER — Other Ambulatory Visit: Payer: Self-pay | Admitting: Nurse Practitioner

## 2022-07-21 ENCOUNTER — Telehealth: Payer: Self-pay | Admitting: Cardiology

## 2022-07-21 ENCOUNTER — Ambulatory Visit: Payer: Medicare PPO | Attending: Nurse Practitioner

## 2022-07-21 DIAGNOSIS — R000 Tachycardia, unspecified: Secondary | ICD-10-CM

## 2022-07-21 DIAGNOSIS — I5022 Chronic systolic (congestive) heart failure: Secondary | ICD-10-CM

## 2022-07-21 DIAGNOSIS — Z79899 Other long term (current) drug therapy: Secondary | ICD-10-CM

## 2022-07-21 NOTE — Telephone Encounter (Signed)
PERCERT:   LONG TERM MONITOR   $0.00

## 2022-07-22 DIAGNOSIS — R000 Tachycardia, unspecified: Secondary | ICD-10-CM | POA: Diagnosis not present

## 2022-07-23 ENCOUNTER — Inpatient Hospital Stay (HOSPITAL_COMMUNITY): Admission: RE | Admit: 2022-07-23 | Payer: Medicare PPO | Source: Ambulatory Visit

## 2022-07-23 ENCOUNTER — Encounter (HOSPITAL_COMMUNITY): Payer: Self-pay

## 2022-07-27 ENCOUNTER — Other Ambulatory Visit (HOSPITAL_BASED_OUTPATIENT_CLINIC_OR_DEPARTMENT_OTHER): Payer: Self-pay | Admitting: Family

## 2022-07-29 ENCOUNTER — Other Ambulatory Visit (HOSPITAL_COMMUNITY)
Admission: RE | Admit: 2022-07-29 | Discharge: 2022-07-29 | Disposition: A | Discharge status: Home / Self Care | Payer: Medicare PPO | Source: Ambulatory Visit | Attending: Nurse Practitioner | Admitting: Nurse Practitioner

## 2022-07-29 DIAGNOSIS — I5022 Chronic systolic (congestive) heart failure: Secondary | ICD-10-CM | POA: Diagnosis not present

## 2022-07-29 DIAGNOSIS — Z79899 Other long term (current) drug therapy: Secondary | ICD-10-CM | POA: Insufficient documentation

## 2022-07-29 LAB — BASIC METABOLIC PANEL
Anion gap: 9 (ref 5–15)
BUN: 21 mg/dL (ref 8–23)
CO2: 24 mmol/L (ref 22–32)
Calcium: 9.2 mg/dL (ref 8.9–10.3)
Chloride: 105 mmol/L (ref 98–111)
Creatinine, Ser: 0.99 mg/dL (ref 0.61–1.24)
GFR, Estimated: >60 mL/min (ref >60)
Glucose, Bld: 152 mg/dL — ABNORMAL HIGH (ref 70–99)
Potassium: 4.4 mmol/L (ref 3.5–5.1)
Sodium: 138 mmol/L (ref 135–145)

## 2022-07-31 DIAGNOSIS — R000 Tachycardia, unspecified: Secondary | ICD-10-CM | POA: Diagnosis not present

## 2022-08-01 ENCOUNTER — Encounter: Payer: Self-pay | Admitting: Nurse Practitioner

## 2022-08-01 ENCOUNTER — Other Ambulatory Visit (HOSPITAL_COMMUNITY)
Admission: RE | Admit: 2022-08-01 | Discharge: 2022-08-01 | Disposition: A | Discharge status: Home / Self Care | Payer: Medicare PPO | Source: Ambulatory Visit | Attending: Nurse Practitioner | Admitting: Nurse Practitioner

## 2022-08-01 ENCOUNTER — Ambulatory Visit: Payer: Medicare PPO | Attending: Nurse Practitioner | Admitting: Nurse Practitioner

## 2022-08-01 VITALS — BP 114/64 | HR 105 | Ht 69.0 in | Wt 185.8 lb

## 2022-08-01 DIAGNOSIS — Z79899 Other long term (current) drug therapy: Secondary | ICD-10-CM | POA: Diagnosis not present

## 2022-08-01 DIAGNOSIS — I471 Supraventricular tachycardia, unspecified: Secondary | ICD-10-CM | POA: Diagnosis not present

## 2022-08-01 DIAGNOSIS — I428 Other cardiomyopathies: Secondary | ICD-10-CM | POA: Diagnosis not present

## 2022-08-01 DIAGNOSIS — I5020 Unspecified systolic (congestive) heart failure: Secondary | ICD-10-CM

## 2022-08-01 DIAGNOSIS — R0602 Shortness of breath: Secondary | ICD-10-CM

## 2022-08-01 DIAGNOSIS — I472 Ventricular tachycardia, unspecified: Secondary | ICD-10-CM | POA: Diagnosis not present

## 2022-08-01 DIAGNOSIS — I5022 Chronic systolic (congestive) heart failure: Secondary | ICD-10-CM | POA: Diagnosis not present

## 2022-08-01 DIAGNOSIS — E785 Hyperlipidemia, unspecified: Secondary | ICD-10-CM | POA: Diagnosis not present

## 2022-08-01 DIAGNOSIS — R000 Tachycardia, unspecified: Secondary | ICD-10-CM | POA: Diagnosis not present

## 2022-08-01 DIAGNOSIS — I2510 Atherosclerotic heart disease of native coronary artery without angina pectoris: Secondary | ICD-10-CM

## 2022-08-01 LAB — BRAIN NATRIURETIC PEPTIDE: B Natriuretic Peptide: 2022 pg/mL — ABNORMAL HIGH (ref 0.0–100.0)

## 2022-08-01 MED ORDER — SPIRONOLACTONE 25 MG PO TABS: 12.5000 mg | ORAL_TABLET | Freq: Every day | ORAL | 6 refills | Status: DC

## 2022-08-01 MED ORDER — METOPROLOL SUCCINATE ER 25 MG PO TB24: 37.5000 mg | ORAL_TABLET | Freq: Every day | ORAL | 6 refills | Status: DC

## 2022-08-01 NOTE — Patient Instructions (Addendum)
Medication Instructions:  Increase Toprol XL to 37.'5mg'$  daily  Decrease Aldactone to 12.'5mg'$  daily  Continue all other medications.     Labwork: BNP - order given today  Office will contact with results via phone, letter or mychart.     Testing/Procedures: none  Follow-Up: Keep follow up as planned with Dr. Domenic Polite  Any Other Special Instructions Will Be Listed Below (If Applicable). You have been referred to Pulmonology   If you need a refill on your cardiac medications before your next appointment, please call your pharmacy.

## 2022-08-01 NOTE — Progress Notes (Unsigned)
Cardiology Office Note:    Date:  07/18/2022 ***  ID:  Ricardo Zuniga, DOB 02-08-1951, MRN [id number]  PCP:  Glenda Chroman, MD   [place] Providers Cardiologist:  [name], MD     Referring MD: Glenda Chroman, MD   CC: [facility] and recent CHF exacerbation  History of Present Illness:    Ricardo Zuniga is a 71 y.o. male with a hx of the following:  Nonobstructive CAD HLD NICM, HFrEF T2DM GERD Hx of leukemia  Patient is a very pleasant 71 year old male with past medical history as mentioned above.  He has been followed by the heart failure clinic and has seen Dr. Domenic Polite in the past.  Echocardiogram in July 2023 revealed EF 45%, trivial MR, moderate AI, normal size and function of RV, mild dilation of ascending aorta at 37 mm.  He was seen in the office on May 23, 2022 with chief complaint of shortness of breath for 2 weeks.  On exam he presented with left lower lobe inspiratory crackles and was provided Lasix 20 mg daily x 3 days and recommended for cardiac CTA.  Had short hospital stay later that month with pneumonia and was treated with IV antibiotics.  Repeat echo revealed EF 30 to 35% and was diuresed.  Left heart cath revealed nonobstructive CAD.  At follow-up visit, Toprol was increased to 25 mg daily and was recommended with close follow-up with clinical Pharm.D. and [name].  Aldactone was increased to 25 mg daily.  Last seen by Laurann Montana, NP on July 07, 2022 for follow-up.  Said he was overall feeling better, still noted some episodes of SHOB at rest and noted some congested cough at times despite completing antibiotic therapy.  Denied any chest pain, swelling, PND/orthopnea.  He was grossly euvolemic on exam and was referred to PREP exercise program at [facility] and was scheduled to follow-up with clinical Pharm.D. at advanced hypertension clinic.  He was told to follow-up with the heart failure clinic and told to continue to follow-up with  Dr. Domenic Polite as scheduled.  He contacted our office on July 14, 2022 reporting swelling in his legs and feet and shortness of breath.  He noted 5 pound weight gain overnight.  Denied chest pain or dizziness, said he took an extra 20 mg of Lasix to improve symptoms.  He was advised to continue an extra dose of Lasix 20 mg daily for few days to get weight down and was scheduled for first available follow-up visit.  Saw his PCP.  Was told to follow-up in office on July 18, 2022.  Today he presents for follow-up for swelling and shortness of breath.  He presents today with his wife who is a retired Therapist, sports. States he was seen at PCP office Monday and had labwork done. proBNP was 6,149. Had worsening SHOB and bilateral lower extremity swelling (R>L), has been taking 40 mg of Lasix daily the rest of this week with an additional 20 mg of Lasix Wednesday of this week. BLE swelling improved. Wife says he does get short of breath even with talking at rest. Does admit to PND but denies orthopnea, sleeps only on 1 pillow. Denies any palpitations, syncope, presyncope, dizziness, acute bleeding, or claudication. Denies any anginal symptoms, but does think he has indigestion. Currently is being treated still for PNA and is taking a course of ABX. Denies any other questions or other concerns today.    Past Medical History:  Diagnosis Date  Anal fissure    APML (acute promyelocytic leukemia) in remission [facility])    Completed treatment 04/2013   CHF (congestive heart failure) (HCC)    Coronary artery disease    Nonobstructive at cardiac catheterization 09/2018   Difficult intubation    w/shoulder surgery at surgical center between 2005-2008   Gastroesophageal reflux disease    History of blood transfusion 2014   History of kidney stones    passed stones   Hypercholesteremia    Neuropathy    bilateral feet   Nonischemic cardiomyopathy ([place])    a. EF 20% in 2015 - thought to possibly be viral induced or  chemotherapy induced from treatments in 2014 b. at 35-40% by echo in 05/2016    Peptic ulcer disease    Pneumonia    Yrs ago   Type 2 diabetes mellitus ([place])    Ureteral colic    Vertigo    tx with valium prn   Wears glasses    Wears hearing aid in both ears     Past Surgical History:  Procedure Laterality Date   ANAL FISSURE REPAIR     APPENDECTOMY     BIOPSY  08/17/2020   Procedure: BIOPSY;  Surgeon: Harvel Quale, MD;  [facility];  Service: Gastroenterology;;   Wilmon Pali RELEASE Left 10/16/2017   Procedure: LEFT CARPAL TUNNEL RELEASE;  Surgeon: Earlie Server, MD;  Location: [place];  Service: Orthopedics;  Laterality: Left;   CARPAL TUNNEL RELEASE Right 11/25/2019   Procedure: CARPAL TUNNEL RELEASE;  Surgeon: Earlie Server, MD;  Location: [facility];  Service: Orthopedics;  Laterality: Right;   CATARACT EXTRACTION, BILATERAL     CERVICAL DISC ARTHROPLASTY N/A 01/05/2018   Procedure: Artificial Cervical Disc ReplacementCervical Three-Four, Cervical Four-Five;  Surgeon: Kristeen Miss, MD;  Location: [place];  Service: Neurosurgery;  Laterality: N/A;   CERVICAL FUSION     CHOLECYSTECTOMY     COLONOSCOPY N/A 06/27/2015   Procedure: COLONOSCOPY;  Surgeon: Rogene Houston, MD;  [facility];  Service: Endoscopy;  Laterality: N/A;  730   COLONOSCOPY WITH PROPOFOL N/A 08/17/2020   Procedure: COLONOSCOPY WITH PROPOFOL;  Surgeon: Harvel Quale, MD;  [facility];  Service: Gastroenterology;  Laterality: N/A;  11:15   COLONOSCOPY WITH PROPOFOL N/A 10/23/2021   Procedure: COLONOSCOPY WITH PROPOFOL;  Surgeon: Harvel Quale, MD;  [facility];  Service: Gastroenterology;  Laterality: N/A;  9:45am   ESOPHAGOGASTRODUODENOSCOPY (EGD) WITH PROPOFOL N/A 08/17/2020   Procedure: ESOPHAGOGASTRODUODENOSCOPY (EGD) WITH PROPOFOL;  Surgeon: Harvel Quale, MD;  [facility];  Service: Gastroenterology;   Laterality: N/A;   KNEE SURGERY     x3   LEFT AND RIGHT HEART CATHETERIZATION WITH CORONARY ANGIOGRAM N/A 12/12/2013   Procedure: LEFT AND RIGHT HEART CATHETERIZATION WITH CORONARY ANGIOGRAM;  Surgeon: [name], MD;  Location: [facility] C[facility];  Service: Cardiovascular;  Laterality: N/A;   LEFT HEART CATH AND CORONARY ANGIOGRAPHY N/A 09/17/2018   Procedure: LEFT HEART CATH AND CORONARY ANGIOGRAPHY;  Surgeon: Wellington Hampshire, MD;  Location: [place] CV [facility];  Service: Cardiovascular;  Laterality: N/A;   LUMBAR FUSION     LUMBAR LAMINECTOMY/DECOMPRESSION MICRODISCECTOMY Bilateral 01/02/2022   Procedure: Bilateral Lumbar Three-Four/Lumbar Four-Five Sublaminar decompression;  Surgeon: Kristeen Miss, MD;  Location: [place];  Service: Neurosurgery;  Laterality: Bilateral;  3C/RM 21 To Follow Dr Annette Stable   POLYPECTOMY  08/17/2020   Procedure: POLYPECTOMY;  Surgeon: Harvel Quale, MD;  [facility];  Service: Gastroenterology;;   POLYPECTOMY  10/23/2021   Procedure: POLYPECTOMY;  Surgeon: Montez Morita, Quillian Quince, MD;  [facility];  Service: Gastroenterology;;   RIGHT/LEFT HEART CATH AND CORONARY ANGIOGRAPHY N/A 06/10/2022   Procedure: RIGHT/LEFT HEART CATH AND CORONARY ANGIOGRAPHY;  Surgeon: Hebert Soho, DO;  Location: [place] CV LAB;  Service: Cardiovascular;  Laterality: N/A;   SHOULDER ACROMIOPLASTY Right 07/18/2015   Procedure: SHOULDER ACROMIOPLASTY;  Surgeon: Earlie Server, MD;  Location: [place];  Service: Orthopedics;  Laterality: Right;   SHOULDER ARTHROSCOPY Right 07/18/2015   Procedure: Right shoulder arthroscopy with debridement;  Surgeon: Earlie Server, MD;  Location: [place];  Service: Orthopedics;  Laterality: Right;   SHOULDER SURGERY     x3   TONSILLECTOMY      Current Medications: Current Meds  Medication Sig   acetaminophen (TYLENOL) 650 MG CR tablet Take 650-1,300 mg by mouth every 8 (eight) hours as  needed for pain.   albuterol (VENTOLIN HFA) 108 (90 Base) MCG/ACT inhaler Inhale 2 puffs into the lungs every 6 (six) hours as needed for wheezing or shortness of breath.   ALPRAZolam (XANAX) 0.25 MG tablet Take 0.25 mg by mouth at bedtime as needed for anxiety or sleep.   aspirin EC 81 MG tablet Take 81 mg by mouth in the morning.   atorvastatin (LIPITOR) 40 MG tablet Take 1 tablet (40 mg total) by mouth daily.   Cholecalciferol (VITAMIN D3) 125 MCG (5000 UT) TABS Take 1 tablet by mouth in the morning.   diazepam (VALIUM) 5 MG tablet Take 5 mg by mouth daily as needed (vertigo).   empagliflozin (JARDIANCE) 10 MG TABS tablet Take 10 mg by mouth daily.   ENTRESTO 24-26 MG Take 1 tablet by mouth twice daily   furosemide (LASIX) 20 MG tablet Take 1 tablet (20 mg total) by mouth daily.   guaiFENesin (MUCINEX) 600 MG 12 hr tablet Take 600 mg by mouth daily as needed (congestion.).   metoprolol succinate (TOPROL-XL) 25 MG 24 hr tablet Take 25 mg by mouth at bedtime.   omeprazole (PRILOSEC) 20 MG capsule Take 20 mg by mouth daily before breakfast.   ondansetron (ZOFRAN) 4 MG tablet Take 1 tablet (4 mg total) by mouth every 6 (six) hours as needed for nausea.   repaglinide (PRANDIN) 2 MG tablet Take 2 mg by mouth 3 (three) times daily before meals.    spironolactone (ALDACTONE) 25 MG tablet Take 1 tablet (25 mg total) by mouth daily.   zinc gluconate 50 MG tablet Take 50 mg by mouth in the morning.     Allergies:   Lisinopril   Social History   Socioeconomic History   Marital status: Married    Spouse name: Not on file   Number of children: Not on file   Years of education: Not on file   Highest education level: Not on file  Occupational History   Occupation: RETIRED    Comment: [employer]   Occupation: SECURITY GUARD  Tobacco Use   Smoking status: Former    Packs/day: 2.00    Years: 17.00    Total pack years: 34.00    Types: Cigarettes    Start date: 02/17/1968    Quit date:  02/28/1986    Years since quitting: 36.4   Smokeless tobacco: Never  Vaping Use   Vaping Use: Never used  Substance and Sexual Activity   Alcohol use: No    Alcohol/week: 0.0 standard  drinks of alcohol   Drug use: No   Sexual activity: Not Currently  Other Topics Concern   Not on file  Social History Narrative   Not on file   Social Determinants of Health   Financial Resource Strain: Not on file  Food Insecurity: No Food Insecurity (06/07/2022)   Hunger Vital Sign    Worried About Running Out of Food in the Last Year: Never true    Ran Out of Food in the Last Year: Never true  Transportation Needs: No Transportation Needs (06/07/2022)   PRAPARE - Hydrologist (Medical): No    Lack of Transportation (Non-Medical): No  Physical Activity: Not on file  Stress: Not on file  Social Connections: Not on file     Family History: The patient's family history includes Colon cancer in an other family member.  ROS:   Review of Systems  Constitutional: Negative.   HENT: Negative.    Eyes: Negative.   Respiratory:  Positive for shortness of breath. Negative for cough, hemoptysis, sputum production and wheezing.   Cardiovascular: Negative.        Recent episode of lower extremity swelling; improved per his report.   Gastrointestinal:  Positive for heartburn. Negative for abdominal pain, blood in stool, constipation, diarrhea, melena, nausea and vomiting.  Genitourinary: Negative.   Musculoskeletal: Negative.   Skin: Negative.   Neurological: Negative.   Endo/Heme/Allergies: Negative.   Psychiatric/Behavioral: Negative.      Please see the history of present illness.    All other systems reviewed and are negative.  EKGs/Labs/Other Studies Reviewed:    The following studies were reviewed today:   EKG:  EKG is not ordered today. EKG dated 06/24/22 reviewed that revealed ST, nonspecific ST abnormality; otherwise nothing acute.   Right and left heart  cath on 06/10/22:   Prox RCA lesion is 40% stenosed.   HEMODYNAMICS: RA:                  8 mmHg (mean) RV:                  56/5-8 mmHg PA:                  52/25 mmHg (34 mean) PCWP:            14 mmHg (mean)                                      Estimated Fick CO/CI   4.3 L/min, 2.1 L/min/m2                                      TPG                 20  mmHg                                            PVR                 4.6 Wood Units  PAPi                3.4       IMPRESSION: 1.  Mildly elevated pre-capillary filling pressures and PVR likely secondary to chronic HFrEF 2.           Normal left sided filling pressures after IV diuresis.  3.           Moderately reduced cardiac index 4.           No obstructive coronary artery disease.      2D Echocardiogram on 06/08/22:  1. Left ventricular ejection fraction, by estimation, is 30 to 35%. The  left ventricle has moderately decreased function. The left ventricle  demonstrates global hypokinesis. The left ventricular internal cavity size  was moderately dilated. Left  ventricular diastolic parameters are indeterminate.   2. Right ventricular systolic function is mildly reduced. The right  ventricular size is mildly enlarged.   3. Left atrial size was mildly dilated.   4. The mitral valve is abnormal. Mild mitral valve regurgitation. No  evidence of mitral stenosis.   5. The aortic valve is tricuspid. There is mild calcification of the  aortic valve. There is mild thickening of the aortic valve. Aortic valve  regurgitation is mild. Aortic valve sclerosis/calcification is present,  without any evidence of aortic  stenosis.   6. The inferior vena cava is dilated in size with >50% respiratory  variability, suggesting right atrial pressure of 8 mmHg.  Left heart cath on 09/17/18: There is moderate to severe left ventricular systolic dysfunction. LV end diastolic pressure is normal. Prox RCA lesion is 40% stenosed.   1.   Moderate nonobstructive one-vessel coronary artery disease.  The RCA stenosis actually appears to be better than most recent cardiac catheterization in 2015. 2.  Moderately severely reduced LV systolic function with an EF of 30%.  Global hypokinesis. 3.  Mild left ventricular end-diastolic pressure.   Recommendations: The patient has nonischemic cardiomyopathy for which I recommend continuing medical therapy.  He appears to be euvolemic.  Consider switching losartan to Entresto and adding spironolactone.  Lexiscan on 08/30/2018: Nonspecific T wave abnormalities in inferolateral leads throughout study. There was no ST segment deviation noted during stress. Defect 1: There is a large defect of moderate severity present in the mid anterior, mid anteroseptal, mid inferoseptal, mid inferior, mid inferolateral, apical anterior and apical inferior location. Findings consistent with prior myocardial infarction with a mild to moderate degree of peri-infarct ischemia. This is a high risk study. Nuclear stress EF: 34%.   Recent Labs: 11/03/2021: ALT 19 07/20/2022: B Natriuretic Peptide 1,764.0; Hemoglobin 13.7; Magnesium 2.2; Platelets 234 07/29/2022: BUN 21; Creatinine, Ser 0.99; Potassium 4.4; Sodium 138  Recent Lipid Panel    Component Value Date/Time   CHOL 79 06/10/2022 0550   TRIG 60 06/10/2022 0550   HDL 27 (L) 06/10/2022 0550   CHOLHDL 2.9 06/10/2022 0550   VLDL 12 06/10/2022 0550   LDLCALC 40 06/10/2022 0550     Risk Assessment/Calculations:    STOP-Bang Score:  2  {  Physical Exam:    VS:  There were no vitals taken for this visit.    Wt Readings from Last 3 Encounters:  07/20/22 181 lb (82.1 kg)  07/18/22 187 lb 6.4 oz (85 kg)  07/07/22 186 lb (84.4 kg)    GEN: Well nourished, well developed 71 y.o. Caucasian male in no acute distress HEENT: Normal NECK: No JVD; No carotid bruits CARDIAC: S1/S2, RRR, no murmurs, rubs, gallops; 2+ peripheral pulses throughout, strong and  equal bilaterally RESPIRATORY:  Clear and diminished to auscultation without rales, wheezing or rhonchi  MUSCULOSKELETAL:  No edema; No deformity  SKIN: Warm and dry NEUROLOGIC:  Alert and oriented x 3 PSYCHIATRIC:  Normal, pleasant affect   ASSESSMENT:    No diagnosis found.  PLAN:    In order of problems listed above:  HFrEF, NICM, medication management Recent symptoms of CHF exacerbation, with improvement in lower extremity swelling; however still having some SHOB. Echo in 05/2022 revealed EF 30-35%, overall euvolemic and well compensated on exam. Recent Pro-BNP - 6,149. BLE swelling has improved while on Lasix 40 mg daily. Will continue Lasix 40 mg daily and may take an additional 20 mg of Lasix daily PRN if weight gain of more than 3 pounds in a day or more than 5 pounds in a week. Repeat BMET and pro-BNP this upcoming Monday.  Continue Entresto, Jardiance, Toprol-XL, and spironolactone. If kidney function stable with next set of labs, plan to increase Entresto to 49/51 mg BID. Low sodium diet, fluid restriction <2L, and daily weights encouraged. Educated to contact our office for weight gain of 2 lbs overnight or 5 lbs in one week. Heart healthy diet and regular cardiovascular exercise encouraged.   Nonobstructive CAD Stable with no anginal symptoms. No indication for ischemic evaluation.  EKG today did not show any acute ischemic changes. Continue aspirin, Lipitor, and Toprol-XL. Heart healthy diet and regular cardiovascular exercise encouraged.   HLD Labs from October 2023 revealed total cholesterol 79, HDL 27, LDL 40, and triglycerides 60.  Continue Lipitor. Heart healthy diet and regular cardiovascular exercise encouraged.   Shortness of breath Most likely due to recent acute exacerbation of CHF and recent PNA. Getting labs next Monday as mentioned above and increasing Lasix to 40 mg daily as mentioned above (see #1). Low likelihood of OSA. Recommended to use incentive spirometer  regularly. Planning to up titrate GDMT for hx of HFrEF. If no improvement in [facility] after tx for PNA and CHF, plan to refer to pulmonology. Has had past hx of COVID and recent episode of PNA.   Disposition: Follow-up with me or APP in 2 weeks or sooner if anything changes.    pSVT  Still get shob not as often, but still copps quicker. Cp from food. Think doing better.   Down on aldactone, up on metoprolol 37.5 mg daily Pulmonology Finished the other round. Working IS.  Probnp          Medication Adjustments/Labs and Tests Ordered: Current medicines are reviewed at length with the patient today.  Concerns regarding medicines are outlined above.  No orders of the defined types were placed in this encounter.  No orders of the defined types were placed in this encounter.   There are no Patient Instructions on file for this visit.   Signed, Finis Bud, NP  08/01/2022 5:47 AM    [place]

## 2022-08-05 ENCOUNTER — Telehealth: Payer: Self-pay

## 2022-08-05 DIAGNOSIS — Z79899 Other long term (current) drug therapy: Secondary | ICD-10-CM

## 2022-08-05 NOTE — Telephone Encounter (Signed)
-----   Message from Finis Bud, NP sent at 08/02/2022  5:43 PM EST ----- Result note sent to patient in [place]. See Mychart comments.   We are having him increase Lasix 40 mg BID x 5 days, then decrease to Lasix 40 mg daily. Please arrange a repeat BMET in 2 weeks.   He will follow up with HF clinic and Dr. Domenic Polite as scheduled. I think torsemide will be too strong a dose for him.   Thanks!   Finis Bud, AGNP-C

## 2022-08-05 NOTE — Telephone Encounter (Signed)
BMET in the system per EP

## 2022-08-19 ENCOUNTER — Other Ambulatory Visit (HOSPITAL_COMMUNITY): Payer: Self-pay

## 2022-08-19 ENCOUNTER — Ambulatory Visit (HOSPITAL_COMMUNITY)
Admission: RE | Admit: 2022-08-19 | Discharge: 2022-08-19 | Disposition: A | Discharge status: Home / Self Care | Payer: Medicare PPO | Source: Ambulatory Visit | Attending: Cardiology | Admitting: Cardiology

## 2022-08-19 ENCOUNTER — Encounter (HOSPITAL_COMMUNITY): Payer: Self-pay | Admitting: Cardiology

## 2022-08-19 VITALS — BP 102/60 | HR 98 | Wt 190.2 lb

## 2022-08-19 DIAGNOSIS — I5022 Chronic systolic (congestive) heart failure: Secondary | ICD-10-CM

## 2022-08-19 DIAGNOSIS — Z79899 Other long term (current) drug therapy: Secondary | ICD-10-CM | POA: Insufficient documentation

## 2022-08-19 DIAGNOSIS — I959 Hypotension, unspecified: Secondary | ICD-10-CM | POA: Insufficient documentation

## 2022-08-19 DIAGNOSIS — Z8679 Personal history of other diseases of the circulatory system: Secondary | ICD-10-CM | POA: Diagnosis not present

## 2022-08-19 DIAGNOSIS — I5032 Chronic diastolic (congestive) heart failure: Secondary | ICD-10-CM

## 2022-08-19 DIAGNOSIS — I25118 Atherosclerotic heart disease of native coronary artery with other forms of angina pectoris: Secondary | ICD-10-CM

## 2022-08-19 DIAGNOSIS — R002 Palpitations: Secondary | ICD-10-CM | POA: Insufficient documentation

## 2022-08-19 DIAGNOSIS — I472 Ventricular tachycardia, unspecified: Secondary | ICD-10-CM | POA: Diagnosis not present

## 2022-08-19 DIAGNOSIS — I471 Supraventricular tachycardia, unspecified: Secondary | ICD-10-CM | POA: Diagnosis not present

## 2022-08-19 DIAGNOSIS — C9241 Acute promyelocytic leukemia, in remission: Secondary | ICD-10-CM | POA: Diagnosis not present

## 2022-08-19 DIAGNOSIS — E1140 Type 2 diabetes mellitus with diabetic neuropathy, unspecified: Secondary | ICD-10-CM | POA: Diagnosis not present

## 2022-08-19 DIAGNOSIS — I2510 Atherosclerotic heart disease of native coronary artery without angina pectoris: Secondary | ICD-10-CM | POA: Insufficient documentation

## 2022-08-19 DIAGNOSIS — I420 Dilated cardiomyopathy: Secondary | ICD-10-CM | POA: Insufficient documentation

## 2022-08-19 LAB — COMPREHENSIVE METABOLIC PANEL
ALT: 38 U/L (ref 0–44)
AST: 24 U/L (ref 15–41)
Albumin: 3.8 g/dL (ref 3.5–5.0)
Alkaline Phosphatase: 74 U/L (ref 38–126)
Anion gap: 10 (ref 5–15)
BUN: 19 mg/dL (ref 8–23)
CO2: 22 mmol/L (ref 22–32)
Calcium: 8.8 mg/dL — ABNORMAL LOW (ref 8.9–10.3)
Chloride: 104 mmol/L (ref 98–111)
Creatinine, Ser: 1.05 mg/dL (ref 0.61–1.24)
GFR, Estimated: >60 mL/min (ref >60)
Glucose, Bld: 126 mg/dL — ABNORMAL HIGH (ref 70–99)
Potassium: 3.9 mmol/L (ref 3.5–5.1)
Sodium: 136 mmol/L (ref 135–145)
Total Bilirubin: 1.1 mg/dL (ref 0.3–1.2)
Total Protein: 7.4 g/dL (ref 6.5–8.1)

## 2022-08-19 LAB — BRAIN NATRIURETIC PEPTIDE: B Natriuretic Peptide: 1916.6 pg/mL — ABNORMAL HIGH (ref 0.0–100.0)

## 2022-08-19 MED ORDER — AMIODARONE HCL 200 MG PO TABS: 200.0000 mg | ORAL_TABLET | Freq: Two times a day (BID) | ORAL | 3 refills | Status: DC

## 2022-08-19 MED ORDER — DIGOXIN 125 MCG PO TABS: 0.1250 mg | ORAL_TABLET | Freq: Every day | ORAL | 3 refills | Status: DC

## 2022-08-19 MED ORDER — METOPROLOL SUCCINATE ER 25 MG PO TB24: 12.5000 mg | ORAL_TABLET | Freq: Every day | ORAL | 3 refills | Status: DC

## 2022-08-19 NOTE — H&P (View-Only) (Signed)
ADVANCED HEART FAILURE CLINIC NOTE  Referring Physician: Glenda Chroman, MD  Primary Care: Glenda Chroman, MD Primary Cardiologist: Charletta Cousin Heart Failure: Hebert Soho  HPI: Ricardo Zuniga is a very pleasant 72 YO WM w/ HFrEF 2/2 nonischemic cardiomyopathy, T2DM and hx of acute promyelocytic leukemia s/p ATRA ([facility] 2014) that was discharged from [facility] in October 2023 after presenting with a 1 week history of orthopnea, PND & LE edema. He had a TTE prior to admission w/ LVEF of 40-45%. Echo during admit was notable for LVEF of 30%-35%. LHC/RHC during admit w/ nonobstructive CAD & moderately reduced CI (2.1 L/min/m2). He was ultimately discharged home on low dose GDMT.   Prior to this admit, his cardiac history dates back to 2015 when he was found to have an LVEF of 25% felt to be triggered by endocarditis.   Interval hx:  Since D/C from [facility] in October uptitration of GDMT has been limited by hypotension. Over the past several weeks, [name] reports becoming increasingly fatigued and SOB. He becomes quickly SOB when walking up 1 flight of stairs and feels that these symptoms are progressing. Most recently his medication doses were reduced at general cardiology appointment due to low Bps. He is also having frequent episodes of SVT.   Activity level/exercise tolerance:  Poor, NYHA III Orthopnea:  Sleeps on 2 pillows Paroxysmal noctural dyspnea:  Infrequent Chest pain/pressure:  no Orthostatic lightheadedness:  infrequent Palpitations:  yes Lower extremity edema:  No Presyncope/syncope:  No Cough:  No  Past Medical History:  Diagnosis Date   Anal fissure    APML (acute promyelocytic leukemia) in remission ([place])    Completed treatment 04/2013   CHF (congestive heart failure) (HCC)    Coronary artery disease    Nonobstructive at cardiac catheterization 09/2018   Difficult intubation    w/shoulder surgery at surgical center between 2005-2008    Gastroesophageal reflux disease    History of blood transfusion 2014   History of kidney stones    passed stones   Hypercholesteremia    Neuropathy    bilateral feet   Nonischemic cardiomyopathy ([place])    a. EF 20% in 2015 - thought to possibly be viral induced or chemotherapy induced from treatments in 2014 b. at 35-40% by echo in 05/2016    Peptic ulcer disease    Pneumonia    Yrs ago   Type 2 diabetes mellitus ([place])    Ureteral colic    Vertigo    tx with valium prn   Wears glasses    Wears hearing aid in both ears     Current Outpatient Medications  Medication Sig Dispense Refill   acetaminophen (TYLENOL) 650 MG CR tablet Take 650-1,300 mg by mouth every 8 (eight) hours as needed for pain.     albuterol (VENTOLIN HFA) 108 (90 Base) MCG/ACT inhaler Inhale 2 puffs into the lungs every 6 (six) hours as needed for wheezing or shortness of breath. 8 g 2   ALPRAZolam (XANAX) 0.25 MG tablet Take 0.25 mg by mouth at bedtime as needed for anxiety or sleep.     amiodarone (PACERONE) 200 MG tablet Take 1 tablet (200 mg total) by mouth 2 (two) times daily. 180 tablet 3   aspirin EC 81 MG tablet Take 81 mg by mouth in the morning.     atorvastatin (LIPITOR) 40 MG tablet Take 1 tablet (40 mg total) by mouth daily. 90 tablet 1   Cholecalciferol (VITAMIN D3) 125  MCG (5000 UT) TABS Take 1 tablet by mouth in the morning.     diazepam (VALIUM) 5 MG tablet Take 5 mg by mouth daily as needed (vertigo).     digoxin (LANOXIN) 0.125 MG tablet Take 1 tablet (0.125 mg total) by mouth daily. 90 tablet 3   empagliflozin (JARDIANCE) 10 MG TABS tablet Take 10 mg by mouth daily.     ENTRESTO 24-26 MG Take 1 tablet by mouth twice daily 180 tablet 1   furosemide (LASIX) 40 MG tablet Take 40 mg by mouth daily. 40 mg in evening prn     guaiFENesin (MUCINEX) 600 MG 12 hr tablet Take 600 mg by mouth daily as needed (congestion.).     omeprazole (PRILOSEC) 20 MG capsule Take 20 mg by mouth daily before breakfast.      ondansetron (ZOFRAN) 4 MG tablet Take 1 tablet (4 mg total) by mouth every 6 (six) hours as needed for nausea. 20 tablet 0   repaglinide (PRANDIN) 2 MG tablet Take 2 mg by mouth 3 (three) times daily before meals.      spironolactone (ALDACTONE) 25 MG tablet Take 0.5 tablets (12.5 mg total) by mouth daily. 15 tablet 6   zinc gluconate 50 MG tablet Take 50 mg by mouth in the morning.     metoprolol succinate (TOPROL-XL) 25 MG 24 hr tablet Take 0.5 tablets (12.5 mg total) by mouth daily. 45 tablet 3   No current facility-administered medications for this encounter.    Allergies  Allergen Reactions   Lisinopril Cough      Social History   Socioeconomic History   Marital status: Married    Spouse name: Not on file   Number of children: Not on file   Years of education: Not on file   Highest education level: Not on file  Occupational History   Occupation: RETIRED    Comment: [employer]   Occupation: SECURITY GUARD  Tobacco Use   Smoking status: Former    Packs/day: 2.00    Years: 17.00    Total pack years: 34.00    Types: Cigarettes    Start date: 02/17/1968    Quit date: 02/28/1986    Years since quitting: 36.4   Smokeless tobacco: Never  Vaping Use   Vaping Use: Never used  Substance and Sexual Activity   Alcohol use: No    Alcohol/week: 0.0 standard drinks of alcohol   Drug use: No   Sexual activity: Not Currently  Other Topics Concern   Not on file  Social History Narrative   Not on file   Social Determinants of Health   Financial Resource Strain: Not on file  Food Insecurity: No Food Insecurity (06/07/2022)   Hunger Vital Sign    Worried About Running Out of Food in the Last Year: Never true    [place] in the Last Year: Never true  Transportation Needs: No Transportation Needs (06/07/2022)   PRAPARE - Hydrologist (Medical): No    Lack of Transportation (Non-Medical): No  Physical Activity: Not on file  Stress: Not  on file  Social Connections: Not on file  Intimate Partner Violence: Not At Risk (06/07/2022)   Humiliation, Afraid, Rape, and Kick questionnaire    Fear of Current or Ex-Partner: No    Emotionally Abused: No    Physically Abused: No    Sexually Abused: No      Family History  Problem Relation Age of Onset  Colon cancer Other     PHYSICAL EXAM: Vitals:   08/19/22 1204  BP: 102/60  Pulse: 98  SpO2: 95%   GENERAL: Well nourished, well developed, and in no apparent distress at rest.  HEENT: Negative for arcus senilis or xanthelasma. There is no scleral icterus.  The mucous membranes are pink and moist.   NECK: Supple, No masses. Normal carotid upstrokes without bruits. No masses or thyromegaly.    CHEST: There are no chest wall deformities. There is no chest wall tenderness. Respirations are unlabored.  Lungs- CTA B/L CARDIAC:  JVP: 9 cm H2O         Normal S1, S2  Normal rate with regular rhythm. No murmurs, rubs or gallops.  Pulses are 2+ and symmetrical in upper and lower extremities. 1+ edema.  ABDOMEN: Soft, non-tender, non-distended. There are no masses or hepatomegaly. There are normal bowel sounds.  EXTREMITIES: Warm and well perfused with no cyanosis, clubbing.  LYMPHATIC: No axillary or supraclavicular lymphadenopathy.  NEUROLOGIC: Patient is oriented x3 with no focal or lateralizing neurologic deficits.  PSYCH: Patients affect is appropriate, there is no evidence of anxiety or depression.  SKIN: Warm and dry; no lesions or wounds.   DATA REVIEW  ECG:NSR  ECHO: - Echo (06/08/22): EF 30-35%, global HK, RV mildly reduced, mild MR   - Echo (7/23): EF 45%, RV normal   - Echo (8/22): EF 40-45%, RV normal   - Echo (7/21): EF 50%, RV normal  - LHC (09/2018): Moderate nonobstructive one-vessel coronary artery disease, prox RCA 40% stenosed.   - Echo (08/2018): EF 40%, RV normal  - Echo (05/2016): EF 35-40%, RV normal   - Echo (05/2014): EF 25-30%, RV mildly reduced  -  Echo (05/2012): EF 50%, RV normal   CATH: R/LHC (10/23): nonobstructive CAD RA 8 PA 52/25 (34 mean) PCWP 14 (mean) CO/CI (Fick): 4.3/2.1                                 PVR 4.6 WU          PAPi 3.4                 ASSESSMENT & PLAN:  Heart failure with reduced ejection fraction Etiology of CZ:[id number] dilated cardiomyopathy; hx of HFrEF dates back to 2015 shortly after being diagnosed with endocarditis from line associated bacteremia. LVEF improved for several years. On bedside TTE today in clinic LVEF appeared to be 20-25% with further dilation of LV cavity.  NYHA class / AHA Stage:III Volume status & Diuretics:  Hypervolemic; Restart lasix '40mg'$  PO daily (curently taking PRN) Vasodilators:Entresto 24/'26mg'$  BID; unable to uptitrate due to hypotension.  Beta-Blocker:decrease toprol to 12.'5mg'$  daily; concern for low output, start digoxin 197mg daily. MXNA:[id number].'5mg'$  daily Cardiometabolic:farxiga '10mg'$   Devices therapies & Valvulopathies:Will require primary prevention ICD; as we start evaluation for advanced therapies will start approval process for lifevest Advanced therapies:Today on bedside TTE, LV function appears to be 20-25% with dilation of LV cavity. In addition, he has had further decline in functional status with inability to uptitrate GDMT. Ziopatch w/ frequent SVT & intermittent NSVT. I had a very lengthy discussion with him today regarding advanced therapies. We will plan to pursue RHC and CPX. If his hemodynamics are dismal plan is to start LVAD evaluation. At this time he is INTERMACS 4 with no other organ dysfunction. He has excellent understanding, his wife is a retired nMarine scientistwho  is very involved with his care and he is very motivated.   2. SVT/NSVT - Frequent palpitations  - amio '200mg'$  BID - start digoxin 134mg daily for HFrEF  3. [facility]   4. Hx of endocarditis - 2015   Jabrea Kallstrom Advanced Heart  Failure Mechanical Circulatory Support

## 2022-08-19 NOTE — Progress Notes (Signed)
ADVANCED HEART FAILURE CLINIC NOTE  Referring Physician: Glenda Chroman, MD  Primary Care: Glenda Chroman, MD Primary Cardiologist: Charletta Cousin Heart Failure: Hebert Soho  HPI: Ricardo Zuniga is a very pleasant 72 YO WM w/ HFrEF 2/2 nonischemic cardiomyopathy, T2DM and hx of acute promyelocytic leukemia s/p ATRA ([facility] 2014) that was discharged from [facility] in October 2023 after presenting with a 1 week history of orthopnea, PND & LE edema. He had a TTE prior to admission w/ LVEF of 40-45%. Echo during admit was notable for LVEF of 30%-35%. LHC/RHC during admit w/ nonobstructive CAD & moderately reduced CI (2.1 L/min/m2). He was ultimately discharged home on low dose GDMT.   Prior to this admit, his cardiac history dates back to 2015 when he was found to have an LVEF of 25% felt to be triggered by endocarditis.   Interval hx:  Since D/C from [facility] in October uptitration of GDMT has been limited by hypotension. Over the past several weeks, [name] reports becoming increasingly fatigued and SOB. He becomes quickly SOB when walking up 1 flight of stairs and feels that these symptoms are progressing. Most recently his medication doses were reduced at general cardiology appointment due to low Bps. He is also having frequent episodes of SVT.   Activity level/exercise tolerance:  Poor, NYHA III Orthopnea:  Sleeps on 2 pillows Paroxysmal noctural dyspnea:  Infrequent Chest pain/pressure:  no Orthostatic lightheadedness:  infrequent Palpitations:  yes Lower extremity edema:  No Presyncope/syncope:  No Cough:  No  Past Medical History:  Diagnosis Date   Anal fissure    APML (acute promyelocytic leukemia) in remission ([place])    Completed treatment 04/2013   CHF (congestive heart failure) (HCC)    Coronary artery disease    Nonobstructive at cardiac catheterization 09/2018   Difficult intubation    w/shoulder surgery at surgical center between 2005-2008    Gastroesophageal reflux disease    History of blood transfusion 2014   History of kidney stones    passed stones   Hypercholesteremia    Neuropathy    bilateral feet   Nonischemic cardiomyopathy ([place])    a. EF 20% in 2015 - thought to possibly be viral induced or chemotherapy induced from treatments in 2014 b. at 35-40% by echo in 05/2016    Peptic ulcer disease    Pneumonia    Yrs ago   Type 2 diabetes mellitus ([place])    Ureteral colic    Vertigo    tx with valium prn   Wears glasses    Wears hearing aid in both ears     Current Outpatient Medications  Medication Sig Dispense Refill   acetaminophen (TYLENOL) 650 MG CR tablet Take 650-1,300 mg by mouth every 8 (eight) hours as needed for pain.     albuterol (VENTOLIN HFA) 108 (90 Base) MCG/ACT inhaler Inhale 2 puffs into the lungs every 6 (six) hours as needed for wheezing or shortness of breath. 8 g 2   ALPRAZolam (XANAX) 0.25 MG tablet Take 0.25 mg by mouth at bedtime as needed for anxiety or sleep.     amiodarone (PACERONE) 200 MG tablet Take 1 tablet (200 mg total) by mouth 2 (two) times daily. 180 tablet 3   aspirin EC 81 MG tablet Take 81 mg by mouth in the morning.     atorvastatin (LIPITOR) 40 MG tablet Take 1 tablet (40 mg total) by mouth daily. 90 tablet 1   Cholecalciferol (VITAMIN D3) 125  MCG (5000 UT) TABS Take 1 tablet by mouth in the morning.     diazepam (VALIUM) 5 MG tablet Take 5 mg by mouth daily as needed (vertigo).     digoxin (LANOXIN) 0.125 MG tablet Take 1 tablet (0.125 mg total) by mouth daily. 90 tablet 3   empagliflozin (JARDIANCE) 10 MG TABS tablet Take 10 mg by mouth daily.     ENTRESTO 24-26 MG Take 1 tablet by mouth twice daily 180 tablet 1   furosemide (LASIX) 40 MG tablet Take 40 mg by mouth daily. 40 mg in evening prn     guaiFENesin (MUCINEX) 600 MG 12 hr tablet Take 600 mg by mouth daily as needed (congestion.).     omeprazole (PRILOSEC) 20 MG capsule Take 20 mg by mouth daily before breakfast.      ondansetron (ZOFRAN) 4 MG tablet Take 1 tablet (4 mg total) by mouth every 6 (six) hours as needed for nausea. 20 tablet 0   repaglinide (PRANDIN) 2 MG tablet Take 2 mg by mouth 3 (three) times daily before meals.      spironolactone (ALDACTONE) 25 MG tablet Take 0.5 tablets (12.5 mg total) by mouth daily. 15 tablet 6   zinc gluconate 50 MG tablet Take 50 mg by mouth in the morning.     metoprolol succinate (TOPROL-XL) 25 MG 24 hr tablet Take 0.5 tablets (12.5 mg total) by mouth daily. 45 tablet 3   No current facility-administered medications for this encounter.    Allergies  Allergen Reactions   Lisinopril Cough      Social History   Socioeconomic History   Marital status: Married    Spouse name: Not on file   Number of children: Not on file   Years of education: Not on file   Highest education level: Not on file  Occupational History   Occupation: RETIRED    Comment: [employer]   Occupation: SECURITY GUARD  Tobacco Use   Smoking status: Former    Packs/day: 2.00    Years: 17.00    Total pack years: 34.00    Types: Cigarettes    Start date: 02/17/1968    Quit date: 02/28/1986    Years since quitting: 36.4   Smokeless tobacco: Never  Vaping Use   Vaping Use: Never used  Substance and Sexual Activity   Alcohol use: No    Alcohol/week: 0.0 standard drinks of alcohol   Drug use: No   Sexual activity: Not Currently  Other Topics Concern   Not on file  Social History Narrative   Not on file   Social Determinants of Health   Financial Resource Strain: Not on file  Food Insecurity: No Food Insecurity (06/07/2022)   Hunger Vital Sign    Worried About Running Out of Food in the Last Year: Never true    [place] in the Last Year: Never true  Transportation Needs: No Transportation Needs (06/07/2022)   PRAPARE - Hydrologist (Medical): No    Lack of Transportation (Non-Medical): No  Physical Activity: Not on file  Stress: Not  on file  Social Connections: Not on file  Intimate Partner Violence: Not At Risk (06/07/2022)   Humiliation, Afraid, Rape, and Kick questionnaire    Fear of Current or Ex-Partner: No    Emotionally Abused: No    Physically Abused: No    Sexually Abused: No      Family History  Problem Relation Age of Onset  Colon cancer Other     PHYSICAL EXAM: Vitals:   08/19/22 1204  BP: 102/60  Pulse: 98  SpO2: 95%   GENERAL: Well nourished, well developed, and in no apparent distress at rest.  HEENT: Negative for arcus senilis or xanthelasma. There is no scleral icterus.  The mucous membranes are pink and moist.   NECK: Supple, No masses. Normal carotid upstrokes without bruits. No masses or thyromegaly.    CHEST: There are no chest wall deformities. There is no chest wall tenderness. Respirations are unlabored.  Lungs- CTA B/L CARDIAC:  JVP: 9 cm H2O         Normal S1, S2  Normal rate with regular rhythm. No murmurs, rubs or gallops.  Pulses are 2+ and symmetrical in upper and lower extremities. 1+ edema.  ABDOMEN: Soft, non-tender, non-distended. There are no masses or hepatomegaly. There are normal bowel sounds.  EXTREMITIES: Warm and well perfused with no cyanosis, clubbing.  LYMPHATIC: No axillary or supraclavicular lymphadenopathy.  NEUROLOGIC: Patient is oriented x3 with no focal or lateralizing neurologic deficits.  PSYCH: Patients affect is appropriate, there is no evidence of anxiety or depression.  SKIN: Warm and dry; no lesions or wounds.   DATA REVIEW  ECG:NSR  ECHO: - Echo (06/08/22): EF 30-35%, global HK, RV mildly reduced, mild MR   - Echo (7/23): EF 45%, RV normal   - Echo (8/22): EF 40-45%, RV normal   - Echo (7/21): EF 50%, RV normal  - LHC (09/2018): Moderate nonobstructive one-vessel coronary artery disease, prox RCA 40% stenosed.   - Echo (08/2018): EF 40%, RV normal  - Echo (05/2016): EF 35-40%, RV normal   - Echo (05/2014): EF 25-30%, RV mildly reduced  -  Echo (05/2012): EF 50%, RV normal   CATH: R/LHC (10/23): nonobstructive CAD RA 8 PA 52/25 (34 mean) PCWP 14 (mean) CO/CI (Fick): 4.3/2.1                                 PVR 4.6 WU          PAPi 3.4                 ASSESSMENT & PLAN:  Heart failure with reduced ejection fraction Etiology of VO:[id number] dilated cardiomyopathy; hx of HFrEF dates back to 2015 shortly after being diagnosed with endocarditis from line associated bacteremia. LVEF improved for several years. On bedside TTE today in clinic LVEF appeared to be 20-25% with further dilation of LV cavity.  NYHA class / AHA Stage:III Volume status & Diuretics:  Hypervolemic; Restart lasix '40mg'$  PO daily (curently taking PRN) Vasodilators:Entresto 24/'26mg'$  BID; unable to uptitrate due to hypotension.  Beta-Blocker:decrease toprol to 12.'5mg'$  daily; concern for low output, start digoxin 127mg daily. MQVZ:[id number].'5mg'$  daily Cardiometabolic:farxiga '10mg'$   Devices therapies & Valvulopathies:Will require primary prevention ICD; as we start evaluation for advanced therapies will start approval process for lifevest Advanced therapies:Today on bedside TTE, LV function appears to be 20-25% with dilation of LV cavity. In addition, he has had further decline in functional status with inability to uptitrate GDMT. Ziopatch w/ frequent SVT & intermittent NSVT. I had a very lengthy discussion with him today regarding advanced therapies. We will plan to pursue RHC and CPX. If his hemodynamics are dismal plan is to start LVAD evaluation. At this time he is INTERMACS 4 with no other organ dysfunction. He has excellent understanding, his wife is a retired nMarine scientistwho  is very involved with his care and he is very motivated.   2. SVT/NSVT - Frequent palpitations  - amio '200mg'$  BID - start digoxin 15mg daily for HFrEF  3. [facility]   4. Hx of endocarditis - 2015   Gedeon Brandow Advanced Heart  Failure Mechanical Circulatory Support

## 2022-08-19 NOTE — Progress Notes (Signed)
MCS EDUCATION NOTE:                Initial VAD teaching completed with pt and his wife [name].     VAD educational packet including "Understanding Your Options with Advanced Heart Failure", "[place] Patient Agreement for VAD Evaluation and Potential Implantation" consent, and [employer] "Heartmate 3 Left Ventricular Device (LVAD) Patient Guide", "[place] HM III Patient Education", "[place] Mechanical Circulatory Support Program", and "Decision Aids for Left Ventricular Assist Device" reviewed in detail and left at bedside for continued reference.   All questions answered regarding VAD implant, hospital stay, and what to expect when discharged home living with a heart pump. Pt identified [name] as his primary caregiver if this therapy should be deemed appropriate. Explained need for 24/7 care when pt is discharged home due to sternal precautions, adaptation to living on support, emotional support, consistent and meticulous exit site care and management, medication adherence and high volume of follow up visits with the [facility] after discharge; both pt and caregiver verbalized understanding of above.   Explained that LVAD can be implanted for two indications in the setting of advanced left ventricular heart failure treatment:  Bridge to transplant - used for patients who cannot safely wait for heart transplant without this device.  Or    Destination therapy - used for patients until end of life or recovery of heart function.  Patient and caregiver(s) acknowledge that the indication at this point in time for LVAD therapy would be for destination therapy due to age.   Provided brief equipment overview and demonstration with HeartMate III training loop including discussion on the following:   a) mobile power unit b) system controller   c) universal Charity fundraiser   d) battery clips   e) Batteries   f)  Perc lock   g) Percutaneous lead   Reviewed and supplied a copy of home inspection  check list stressing that only three pronged grounded power outlets can be used for VAD equipment. Mr Rennaker confirmed home has electrical outlets that will support the equipment along with access working telephone.  Identified the following lifestyle modifications while living on MCS:    1. No driving for at least three months and then only if doctor gives permission to do so.   2. No tub baths while pump implanted, and shower only when doctor gives permission.   3. No swimming or submersion in water while implanted with pump.   4. No contact sports or engaging in jumping activities.   5. Always have a backup controller, charged spare batteries, and battery clips nearby at all times in case of emergency.   6. Call the doctor or hospital contact person if any change in how the pump sounds, feels, or works.   7. Plan to sleep only when connected to the power module.   8. Do not sleep on your stomach.   9. Keep a backup system controller, charged batteries, battery clips, and flashlight near you during sleep in case of electrical power outage.   10. Exit site care including dressing changes, monitoring for infection, and importance of keeping percutaneous lead stabilized at all times.     Extended the option to have one of our current patients and caregiver(s) come to talk with them about living on support to assist with decision making.   Reviewed pictures of VAD drive line, site care, dressing changes, and drive line stabilization including securement attachment device and abdominal binder. Discussed with pt  and family that they will be required to purchase dressing supplies as long as patient has the VAD in place.   Reinforced need for 24 hour/7 day week caregivers. He will also need to abide by sternal precautions with no lifting >10lbs, pushing, pulling and will need assistance with adapting to new life style with VAD equipment and care.   Intermacs patient survival statistics through June  2023 reviewed with patient and caregiver as follows:                                              The patient understands that from this discussion it does not mean that they will receive the device, but that depends on an extensive evaluation process. The patient is aware of the fact that if at anytime they want to stop the evaluation process they can.  All questions have been answered at this time and contact information was provided should they encounter any further questions. They would both like time to discuss this at home before making a decision regarding proceeding with the evaluation process.   Pt is scheduled for RHC 09/01/22 at 0930, and CPX scheduled 09/15/22 at 1100. VAD coordinators will plan to follow along for readiness/appropriateness of VAD evaluation.   Total Session Time: 40 minutes  Emerson Monte RN [place] Coordinator  Office: [phone]  24/7 Pager: [phone]

## 2022-08-19 NOTE — Patient Instructions (Addendum)
START Digoxin 1103mg daily  START Amiodarone 200 mg Twice daily  DECREASE Toprol to 12.'5mg'$  (1/2 Tab ) daily  Labs done today, your results will be available in MyChart, we will contact you for abnormal readings.  Your physician has recommended that you have a cardiopulmonary stress test (CPX). CPX testing is a non-invasive measurement of heart and lung function. It replaces a traditional treadmill stress test. This type of test provides a tremendous amount of information that relates not only to your present condition but also for future outcomes. This test combines measurements of you ventilation, respiratory gas exchange in the lungs, electrocardiogram (EKG), blood pressure and physical response before, during, and following an exercise protocol. YOU WILL BE CALLED TO HAVE THIS TEST ARRANGED.  Your provider has ordered a LArmed forces training and education officerfor you. You will be called to have the life vest delivered to you.  Your physician has requested that you have an echocardiogram. Echocardiography is a painless test that uses sound waves to create images of your heart. It provides your doctor with information about the size and shape of your heart and how well your heart's chambers and valves are working. This procedure takes approximately one hour. There are no restrictions for this procedure. Please do NOT wear cologne, perfume, aftershave, or lotions (deodorant is allowed). Please arrive 15 minutes prior to your appointment time.  You are scheduled for a Cardiac Catheterization on Monday, January 22 with Dr.  SDaniel Nones.  1. [facility] [address] [id number] 7:30 AM (This time is two hours before your procedure to ensure your preparation). Free valet parking service is available.   Special note: Every effort is made to have your procedure done on time. Please understand that emergencies sometimes delay scheduled  procedures.  2. Diet: Do not eat solid foods after midnight.  The patient may have clear liquids until 5am upon the day of the procedure.  3. Medication instructions in preparation for your procedure:   Contrast Allergy: No  HOLD YOUR Spironolactone, Prandin, Jardiance and Lasix the morning of your Procedure   On the morning of your procedure, take your morning medicines NOT listed above.  You may use sips of water.  5. Plan for one night stay--bring personal belongings. 6. Bring a current list of your medications and current insurance cards. 7. You MUST have a responsible person to drive you home. 8. Someone MUST be with you the first 24 hours after you arrive home or your discharge will be delayed. 9. Please wear clothes that are easy to get on and off and wear slip-on shoes.

## 2022-08-20 ENCOUNTER — Telehealth (HOSPITAL_COMMUNITY): Payer: Self-pay

## 2022-08-20 DIAGNOSIS — I700 Atherosclerosis of aorta: Secondary | ICD-10-CM | POA: Diagnosis not present

## 2022-08-20 DIAGNOSIS — I420 Dilated cardiomyopathy: Secondary | ICD-10-CM | POA: Diagnosis not present

## 2022-08-20 DIAGNOSIS — I429 Cardiomyopathy, unspecified: Secondary | ICD-10-CM | POA: Diagnosis not present

## 2022-08-20 DIAGNOSIS — Z299 Encounter for prophylactic measures, unspecified: Secondary | ICD-10-CM | POA: Diagnosis not present

## 2022-08-20 DIAGNOSIS — I10 Essential (primary) hypertension: Secondary | ICD-10-CM | POA: Diagnosis not present

## 2022-08-20 DIAGNOSIS — I509 Heart failure, unspecified: Secondary | ICD-10-CM | POA: Diagnosis not present

## 2022-08-20 NOTE — Telephone Encounter (Signed)
Referral faxed [employer] for Life Vest on 08/20/22

## 2022-08-21 ENCOUNTER — Telehealth (HOSPITAL_COMMUNITY): Payer: Self-pay | Admitting: *Deleted

## 2022-08-21 DIAGNOSIS — I428 Other cardiomyopathies: Secondary | ICD-10-CM | POA: Diagnosis not present

## 2022-08-21 DIAGNOSIS — Z299 Encounter for prophylactic measures, unspecified: Secondary | ICD-10-CM | POA: Diagnosis not present

## 2022-08-21 DIAGNOSIS — I10 Essential (primary) hypertension: Secondary | ICD-10-CM | POA: Diagnosis not present

## 2022-08-21 DIAGNOSIS — I5022 Chronic systolic (congestive) heart failure: Secondary | ICD-10-CM | POA: Diagnosis not present

## 2022-08-21 DIAGNOSIS — E1159 Type 2 diabetes mellitus with other circulatory complications: Secondary | ICD-10-CM | POA: Diagnosis not present

## 2022-08-22 ENCOUNTER — Other Ambulatory Visit: Payer: Self-pay | Admitting: Cardiology

## 2022-08-24 NOTE — Progress Notes (Unsigned)
Cardiology Office Note  Date: 08/25/2022   ID: Ricardo Zuniga, Bas 1951/02/15, MRN [id number]  PCP:  Glenda Chroman, MD  Cardiologist:  [name], MD Electrophysiologist:  None   Chief Complaint  Patient presents with   Cardiac follow-up    History of Present Illness: Ricardo Zuniga is a 72 y.o. male recently seen in the heart failure clinic by [name], I reviewed the note.  He is scheduled for a CPX and right heart catheterization.  He is here with his wife for a follow-up visit.  Reports NYHA class III dyspnea, no fluid retention at this time, his weight is down a few pounds by his scales since last week.  He does not report any palpitations, no syncope.  Currently wearing LifeVest ICD.  Sleeping somewhat better as well.  Recent cardiac monitor showed occasional PVCs and brief episodes of NSVT.  Also several episodes of PSVT, the longest of which lasted for nearly an hour and a half with average heart rate in the 150s.  He was started on both amiodarone and digoxin in the heart failure clinic.  We went over his medications today.  I also reviewed his recent lab work.  He needed a refill on Aldactone, otherwise no changes made.  Past Medical History:  Diagnosis Date   Anal fissure    APML (acute promyelocytic leukemia) in remission ([place])    Completed treatment 04/2013   CHF (congestive heart failure) (HCC)    Coronary artery disease    Nonobstructive at cardiac catheterization 09/2018   Difficult intubation    w/shoulder surgery at surgical center between 2005-2008   Gastroesophageal reflux disease    History of blood transfusion 2014   History of kidney stones    passed stones   Hypercholesteremia    Neuropathy    bilateral feet   Nonischemic cardiomyopathy ([place])    a. EF 20% in 2015 - thought to possibly be viral induced or chemotherapy induced from treatments in 2014 b. at 35-40% by echo in 05/2016    Peptic ulcer disease    Pneumonia    Yrs ago   Type  2 diabetes mellitus ([place])    Ureteral colic    Vertigo    tx with valium prn   Wears glasses    Wears hearing aid in both ears     Current Outpatient Medications  Medication Sig Dispense Refill   acetaminophen (TYLENOL) 650 MG CR tablet Take 650-1,300 mg by mouth every 8 (eight) hours as needed for pain.     albuterol (VENTOLIN HFA) 108 (90 Base) MCG/ACT inhaler Inhale 2 puffs into the lungs every 6 (six) hours as needed for wheezing or shortness of breath. 8 g 2   ALPRAZolam (XANAX) 0.25 MG tablet Take 0.25 mg by mouth at bedtime as needed for anxiety or sleep.     amiodarone (PACERONE) 200 MG tablet Take 1 tablet (200 mg total) by mouth 2 (two) times daily. 180 tablet 3   aspirin EC 81 MG tablet Take 81 mg by mouth in the morning.     atorvastatin (LIPITOR) 40 MG tablet Take 1 tablet (40 mg total) by mouth daily. 90 tablet 1   Cholecalciferol (VITAMIN D3) 125 MCG (5000 UT) TABS Take 1 tablet by mouth in the morning.     diazepam (VALIUM) 5 MG tablet Take 5 mg by mouth daily as needed (vertigo).     digoxin (LANOXIN) 0.125 MG tablet Take 1 tablet (0.125 mg total)  by mouth daily. 90 tablet 3   empagliflozin (JARDIANCE) 10 MG TABS tablet Take 10 mg by mouth daily.     furosemide (LASIX) 40 MG tablet Take 40 mg by mouth daily. 40 mg in evening prn     guaiFENesin (MUCINEX) 600 MG 12 hr tablet Take 600 mg by mouth daily as needed (congestion.).     metoprolol succinate (TOPROL-XL) 25 MG 24 hr tablet Take 0.5 tablets (12.5 mg total) by mouth daily. 45 tablet 3   omeprazole (PRILOSEC) 20 MG capsule Take 20 mg by mouth daily before breakfast.     ondansetron (ZOFRAN) 4 MG tablet Take 1 tablet (4 mg total) by mouth every 6 (six) hours as needed for nausea. 20 tablet 0   repaglinide (PRANDIN) 2 MG tablet Take 2 mg by mouth 3 (three) times daily before meals.      sacubitril-valsartan (ENTRESTO) 24-26 MG Take 1 tablet by mouth twice daily 180 tablet 1   zinc gluconate 50 MG tablet Take 50 mg by  mouth in the morning.     spironolactone (ALDACTONE) 25 MG tablet Take 0.5 tablets (12.5 mg total) by mouth daily. 45 tablet 3   No current facility-administered medications for this visit.   Allergies:  Lisinopril   ROS: No syncope, no orthopnea or PND.  Physical Exam: VS:  BP 100/60   Pulse 89   Ht '5\' 9"'$  (1.753 m)   Wt 185 lb (83.9 kg)   SpO2 97%   BMI 27.32 kg/m , BMI Body mass index is 27.32 kg/m.  Wt Readings from Last 3 Encounters:  08/25/22 185 lb (83.9 kg)  08/19/22 190 lb 3.2 oz (86.3 kg)  08/01/22 185 lb 12.8 oz (84.3 kg)    General: Patient appears comfortable at rest. HEENT: Conjunctiva and lids normal. Neck: Supple, no elevated JVP or carotid bruits. Lungs: Clear to auscultation, nonlabored breathing at rest. Cardiac: Indistinct PMI, RRR, 1/6 systolic murmur, no S3. Extremities: No pitting edema.  ECG:  An ECG dated 07/20/2022 was personally reviewed today and demonstrated:  Sinus tachycardia with nonspecific ST-T changes.  Recent Labwork: 07/20/2022: Hemoglobin 13.7; Magnesium 2.2; Platelets 234 08/19/2022: ALT 38; AST 24; B Natriuretic Peptide 1,916.6; BUN 19; Creatinine, Ser 1.05; Potassium 3.9; Sodium 136     Component Value Date/Time   CHOL 79 06/10/2022 0550   TRIG 60 06/10/2022 0550   HDL 27 (L) 06/10/2022 0550   CHOLHDL 2.9 06/10/2022 0550   VLDL 12 06/10/2022 0550   LDLCALC 40 06/10/2022 0550    Other Studies Reviewed Today:  Echocardiogram 06/08/2022:  1. Left ventricular ejection fraction, by estimation, is 30 to 35%. The  left ventricle has moderately decreased function. The left ventricle  demonstrates global hypokinesis. The left ventricular internal cavity size  was moderately dilated. Left  ventricular diastolic parameters are indeterminate.   2. Right ventricular systolic function is mildly reduced. The right  ventricular size is mildly enlarged.   3. Left atrial size was mildly dilated.   4. The mitral valve is abnormal. Mild  mitral valve regurgitation. No  evidence of mitral stenosis.   5. The aortic valve is tricuspid. There is mild calcification of the  aortic valve. There is mild thickening of the aortic valve. Aortic valve  regurgitation is mild. Aortic valve sclerosis/calcification is present,  without any evidence of aortic  stenosis.   6. The inferior vena cava is dilated in size with >50% respiratory  variability, suggesting right atrial pressure of 8 mmHg.   Cardiac  monitor December 2023: ZIO XT reviewed.  6 days, 17 hours analyzed.   Predominant rhythm is sinus with heart rate ranging from 58 bpm up to 128 bpm and average heart rate 91 bpm. There were rare PACs including atrial couplets and triplets representing less than 1% total beats. There were occasional PVCs representing 3.7% total beats with otherwise rare ventricular couplets and triplets and limited episodes of ventricular bigeminy and trigeminy. There were 3 episodes of NSVT, the longest of which lasted 7 beats. Several episodes of PSVT were noted, the longest of which was sustained for 1 hour and 27 minutes with average heart rate in the 150s. No significant pauses.  Assessment and Plan:  1.  HFrEF with nonischemic cardiomyopathy, LVEF approximately 25% by recent evaluation.  Has had NYHA class III symptoms recently, fluid status generally improved on current diuretic regimen.  He is being followed in the heart failure clinic with plan for CPX and right heart catheterization in the near future.  He has a LifeVest ICD in place as well.  Continue Toprol-XL, Lanoxin, Entresto, Jardiance, Aldactone, and Lasix.  2.  PSVT.  Recent cardiac monitor did show a prolonged episode lasting about an hour and a half.  He does not feel any sense of palpitations.  Question whether he could have some component of a tachycardia induced cardiomyopathy, I agree with initiation of amiodarone recently.  Currently on 200 mg twice daily, recommended that he cut this  down to once daily after a total course of 2 weeks.  LFTs and TSH within the last 6 months normal range.  3.  Nonobstructive CAD, no active angina.  He is on aspirin and Lipitor.  Medication Adjustments/Labs and Tests Ordered: Current medicines are reviewed at length with the patient today.  Concerns regarding medicines are outlined above.   Tests Ordered: No orders of the defined types were placed in this encounter.   Medication Changes: Meds ordered this encounter  Medications   spironolactone (ALDACTONE) 25 MG tablet    Sig: Take 0.5 tablets (12.5 mg total) by mouth daily.    Dispense:  45 tablet    Refill:  3    Dose decreased 08/01/2022    Disposition:  Follow up  3 months.  Signed, Satira Sark, MD, [facility] 08/25/2022 8:56 AM    [place] [facility] at [place], [place], [place] [address] Phone: [phone]; Fax: [phone]

## 2022-08-25 ENCOUNTER — Ambulatory Visit: Payer: Medicare PPO | Attending: Cardiology | Admitting: Cardiology

## 2022-08-25 ENCOUNTER — Encounter: Payer: Self-pay | Admitting: Cardiology

## 2022-08-25 VITALS — BP 100/60 | HR 89 | Ht 69.0 in | Wt 185.0 lb

## 2022-08-25 DIAGNOSIS — I5020 Unspecified systolic (congestive) heart failure: Secondary | ICD-10-CM | POA: Diagnosis not present

## 2022-08-25 DIAGNOSIS — I471 Supraventricular tachycardia, unspecified: Secondary | ICD-10-CM

## 2022-08-25 MED ORDER — SPIRONOLACTONE 25 MG PO TABS: 12.5000 mg | ORAL_TABLET | Freq: Every day | ORAL | 3 refills | Status: DC

## 2022-08-25 NOTE — Patient Instructions (Signed)
Medication Instructions:  ?Your physician recommends that you continue on your current medications as directed. Please refer to the Current Medication list given to you today. ? ? ?Labwork: ?None today ? ?Testing/Procedures: ?None today ? ?Follow-Up: ?3 months ? ?Any Other Special Instructions Will Be Listed Below (If Applicable). ? ?If you need a refill on your cardiac medications before your next appointment, please call your pharmacy. ? ?

## 2022-08-26 ENCOUNTER — Encounter (HOSPITAL_BASED_OUTPATIENT_CLINIC_OR_DEPARTMENT_OTHER): Payer: Self-pay

## 2022-08-27 ENCOUNTER — Telehealth (HOSPITAL_COMMUNITY): Payer: Self-pay

## 2022-08-27 NOTE — Telephone Encounter (Signed)
Patient called and informed of time change of his Cath.

## 2022-08-28 ENCOUNTER — Encounter: Payer: Self-pay | Admitting: Cardiology

## 2022-08-28 ENCOUNTER — Encounter (HOSPITAL_COMMUNITY): Payer: Self-pay | Admitting: Cardiology

## 2022-08-28 ENCOUNTER — Ambulatory Visit: Payer: Medicare PPO | Attending: Cardiology | Admitting: *Deleted

## 2022-08-28 DIAGNOSIS — I480 Paroxysmal atrial fibrillation: Secondary | ICD-10-CM | POA: Diagnosis not present

## 2022-08-28 DIAGNOSIS — I471 Supraventricular tachycardia, unspecified: Secondary | ICD-10-CM

## 2022-08-28 NOTE — Progress Notes (Signed)
Presents for nurse visit for EKG per recent phone/mychart message. EKG done and given to MD for review.

## 2022-08-28 NOTE — Telephone Encounter (Signed)
Reports checking HR with BP with home BP monitor & pulse oximeter. Unable to check manual due to HR irregular and fluctuating.  Reports lightheaded and jittery. Denies chest pain or SOB Reports currently wearing life vest BP 132/64 O2 Sat 97 and HR is fluctuating 40's-90 Reports staying well hydrated Advised that message would be sent to provider and if he develops worsening symptoms, to go to the ED for an evaluation. Verbalized understanding of plan

## 2022-08-28 NOTE — Telephone Encounter (Signed)
Advised to come to office for EKG Verbalized understanding.

## 2022-09-01 ENCOUNTER — Inpatient Hospital Stay (HOSPITAL_COMMUNITY): Payer: Medicare PPO

## 2022-09-01 ENCOUNTER — Encounter (HOSPITAL_COMMUNITY)
Admission: AD | Disposition: A | Discharge status: Home / Self Care | Payer: Self-pay | Source: Home / Self Care | Attending: Cardiology

## 2022-09-01 ENCOUNTER — Other Ambulatory Visit: Payer: Self-pay

## 2022-09-01 ENCOUNTER — Encounter (HOSPITAL_COMMUNITY): Payer: Self-pay | Admitting: Cardiology

## 2022-09-01 ENCOUNTER — Inpatient Hospital Stay (HOSPITAL_COMMUNITY)
Admission: AD | Admit: 2022-09-01 | Discharge: 2022-09-02 | DRG: 287 | Disposition: A | Discharge status: Home / Self Care | Payer: Medicare PPO | Attending: Cardiology | Admitting: Cardiology

## 2022-09-01 ENCOUNTER — Other Ambulatory Visit (HOSPITAL_COMMUNITY): Payer: Medicare PPO

## 2022-09-01 ENCOUNTER — Encounter (HOSPITAL_COMMUNITY): Payer: Medicare PPO

## 2022-09-01 DIAGNOSIS — Z974 Presence of external hearing-aid: Secondary | ICD-10-CM | POA: Diagnosis not present

## 2022-09-01 DIAGNOSIS — K219 Gastro-esophageal reflux disease without esophagitis: Secondary | ICD-10-CM | POA: Diagnosis present

## 2022-09-01 DIAGNOSIS — Z515 Encounter for palliative care: Secondary | ICD-10-CM

## 2022-09-01 DIAGNOSIS — I2722 Pulmonary hypertension due to left heart disease: Secondary | ICD-10-CM | POA: Diagnosis not present

## 2022-09-01 DIAGNOSIS — N200 Calculus of kidney: Secondary | ICD-10-CM | POA: Diagnosis not present

## 2022-09-01 DIAGNOSIS — I420 Dilated cardiomyopathy: Secondary | ICD-10-CM | POA: Diagnosis not present

## 2022-09-01 DIAGNOSIS — I472 Ventricular tachycardia, unspecified: Secondary | ICD-10-CM | POA: Diagnosis not present

## 2022-09-01 DIAGNOSIS — I2510 Atherosclerotic heart disease of native coronary artery without angina pectoris: Secondary | ICD-10-CM | POA: Diagnosis not present

## 2022-09-01 DIAGNOSIS — Z01818 Encounter for other preprocedural examination: Secondary | ICD-10-CM | POA: Diagnosis not present

## 2022-09-01 DIAGNOSIS — E7800 Pure hypercholesterolemia, unspecified: Secondary | ICD-10-CM | POA: Diagnosis present

## 2022-09-01 DIAGNOSIS — C9241 Acute promyelocytic leukemia, in remission: Secondary | ICD-10-CM | POA: Diagnosis present

## 2022-09-01 DIAGNOSIS — Z87891 Personal history of nicotine dependence: Secondary | ICD-10-CM | POA: Diagnosis not present

## 2022-09-01 DIAGNOSIS — Z87442 Personal history of urinary calculi: Secondary | ICD-10-CM

## 2022-09-01 DIAGNOSIS — E876 Hypokalemia: Secondary | ICD-10-CM | POA: Diagnosis present

## 2022-09-01 DIAGNOSIS — I5022 Chronic systolic (congestive) heart failure: Secondary | ICD-10-CM | POA: Diagnosis not present

## 2022-09-01 DIAGNOSIS — Z8711 Personal history of peptic ulcer disease: Secondary | ICD-10-CM | POA: Diagnosis not present

## 2022-09-01 DIAGNOSIS — I471 Supraventricular tachycardia, unspecified: Secondary | ICD-10-CM | POA: Diagnosis not present

## 2022-09-01 DIAGNOSIS — R911 Solitary pulmonary nodule: Secondary | ICD-10-CM | POA: Diagnosis present

## 2022-09-01 DIAGNOSIS — R002 Palpitations: Secondary | ICD-10-CM | POA: Diagnosis present

## 2022-09-01 DIAGNOSIS — Z800 Family history of malignant neoplasm of digestive organs: Secondary | ICD-10-CM | POA: Diagnosis not present

## 2022-09-01 DIAGNOSIS — Z7984 Long term (current) use of oral hypoglycemic drugs: Secondary | ICD-10-CM | POA: Diagnosis not present

## 2022-09-01 DIAGNOSIS — I959 Hypotension, unspecified: Secondary | ICD-10-CM | POA: Diagnosis present

## 2022-09-01 DIAGNOSIS — Z8679 Personal history of other diseases of the circulatory system: Secondary | ICD-10-CM | POA: Diagnosis not present

## 2022-09-01 DIAGNOSIS — J929 Pleural plaque without asbestos: Secondary | ICD-10-CM | POA: Diagnosis not present

## 2022-09-01 DIAGNOSIS — Z79899 Other long term (current) drug therapy: Secondary | ICD-10-CM

## 2022-09-01 DIAGNOSIS — I5023 Acute on chronic systolic (congestive) heart failure: Secondary | ICD-10-CM

## 2022-09-01 DIAGNOSIS — K029 Dental caries, unspecified: Secondary | ICD-10-CM | POA: Diagnosis not present

## 2022-09-01 DIAGNOSIS — Z01810 Encounter for preprocedural cardiovascular examination: Secondary | ICD-10-CM | POA: Diagnosis not present

## 2022-09-01 DIAGNOSIS — J811 Chronic pulmonary edema: Secondary | ICD-10-CM | POA: Diagnosis not present

## 2022-09-01 DIAGNOSIS — J90 Pleural effusion, not elsewhere classified: Secondary | ICD-10-CM | POA: Diagnosis not present

## 2022-09-01 DIAGNOSIS — I5021 Acute systolic (congestive) heart failure: Secondary | ICD-10-CM | POA: Diagnosis not present

## 2022-09-01 DIAGNOSIS — J9811 Atelectasis: Secondary | ICD-10-CM | POA: Diagnosis not present

## 2022-09-01 DIAGNOSIS — E1142 Type 2 diabetes mellitus with diabetic polyneuropathy: Secondary | ICD-10-CM | POA: Diagnosis present

## 2022-09-01 DIAGNOSIS — R188 Other ascites: Secondary | ICD-10-CM | POA: Diagnosis not present

## 2022-09-01 DIAGNOSIS — Z7189 Other specified counseling: Secondary | ICD-10-CM | POA: Diagnosis not present

## 2022-09-01 DIAGNOSIS — Z888 Allergy status to other drugs, medicaments and biological substances status: Secondary | ICD-10-CM

## 2022-09-01 DIAGNOSIS — I700 Atherosclerosis of aorta: Secondary | ICD-10-CM | POA: Diagnosis not present

## 2022-09-01 HISTORY — PX: RIGHT HEART CATH: CATH118263

## 2022-09-01 LAB — CBC
HCT: 42.1 % (ref 39.0–52.0)
Hemoglobin: 14.3 g/dL (ref 13.0–17.0)
MCH: 30.5 pg (ref 26.0–34.0)
MCHC: 34 g/dL (ref 30.0–36.0)
MCV: 89.8 fL (ref 80.0–100.0)
Platelets: 204 10*3/uL (ref 150–400)
RBC: 4.69 MIL/uL (ref 4.22–5.81)
RDW: 15.8 % — ABNORMAL HIGH (ref 11.5–15.5)
WBC: 4.5 10*3/uL (ref 4.0–10.5)
nRBC: 0 % (ref 0.0–0.2)

## 2022-09-01 LAB — CBC WITH DIFFERENTIAL/PLATELET
Abs Immature Granulocytes: 0.02 10*3/uL (ref 0.00–0.07)
Basophils Absolute: 0 10*3/uL (ref 0.0–0.1)
Basophils Relative: 1 %
Eosinophils Absolute: 0.1 10*3/uL (ref 0.0–0.5)
Eosinophils Relative: 2 %
HCT: 41 % (ref 39.0–52.0)
Hemoglobin: 14.6 g/dL (ref 13.0–17.0)
Immature Granulocytes: 0 %
Lymphocytes Relative: 21 %
Lymphs Abs: 1.1 10*3/uL (ref 0.7–4.0)
MCH: 31.1 pg (ref 26.0–34.0)
MCHC: 35.6 g/dL (ref 30.0–36.0)
MCV: 87.2 fL (ref 80.0–100.0)
Monocytes Absolute: 0.4 10*3/uL (ref 0.1–1.0)
Monocytes Relative: 7 %
Neutro Abs: 3.5 10*3/uL (ref 1.7–7.7)
Neutrophils Relative %: 69 %
Platelets: 195 10*3/uL (ref 150–400)
RBC: 4.7 MIL/uL (ref 4.22–5.81)
RDW: 15.8 % — ABNORMAL HIGH (ref 11.5–15.5)
WBC: 5.1 10*3/uL (ref 4.0–10.5)
nRBC: 0 % (ref 0.0–0.2)

## 2022-09-01 LAB — POCT I-STAT EG7
Acid-Base Excess: 0 mmol/L (ref 0.0–2.0)
Acid-Base Excess: 0 mmol/L (ref 0.0–2.0)
Bicarbonate: 24.7 mmol/L (ref 20.0–28.0)
Bicarbonate: 25.3 mmol/L (ref 20.0–28.0)
Calcium, Ion: 1.22 mmol/L (ref 1.15–1.40)
Calcium, Ion: 1.22 mmol/L (ref 1.15–1.40)
HCT: 42 % (ref 39.0–52.0)
HCT: 42 % (ref 39.0–52.0)
Hemoglobin: 14.3 g/dL (ref 13.0–17.0)
Hemoglobin: 14.3 g/dL (ref 13.0–17.0)
O2 Saturation: 58 %
O2 Saturation: 59 %
Potassium: 3.3 mmol/L — ABNORMAL LOW (ref 3.5–5.1)
Potassium: 3.3 mmol/L — ABNORMAL LOW (ref 3.5–5.1)
Sodium: 142 mmol/L (ref 135–145)
Sodium: 143 mmol/L (ref 135–145)
TCO2: 26 mmol/L (ref 22–32)
TCO2: 27 mmol/L (ref 22–32)
pCO2, Ven: 40.7 mmHg — ABNORMAL LOW (ref 44–60)
pCO2, Ven: 41.5 mmHg — ABNORMAL LOW (ref 44–60)
pH, Ven: 7.391 (ref 7.25–7.43)
pH, Ven: 7.394 (ref 7.25–7.43)
pO2, Ven: 31 mmHg — CL (ref 32–45)
pO2, Ven: 31 mmHg — CL (ref 32–45)

## 2022-09-01 LAB — COMPREHENSIVE METABOLIC PANEL
ALT: 24 U/L (ref 0–44)
AST: 18 U/L (ref 15–41)
Albumin: 3.7 g/dL (ref 3.5–5.0)
Alkaline Phosphatase: 61 U/L (ref 38–126)
Anion gap: 13 (ref 5–15)
BUN: 18 mg/dL (ref 8–23)
CO2: 23 mmol/L (ref 22–32)
Calcium: 9.1 mg/dL (ref 8.9–10.3)
Chloride: 102 mmol/L (ref 98–111)
Creatinine, Ser: 1.14 mg/dL (ref 0.61–1.24)
GFR, Estimated: >60 mL/min (ref >60)
Glucose, Bld: 140 mg/dL — ABNORMAL HIGH (ref 70–99)
Potassium: 3.2 mmol/L — ABNORMAL LOW (ref 3.5–5.1)
Sodium: 138 mmol/L (ref 135–145)
Total Bilirubin: 1.1 mg/dL (ref 0.3–1.2)
Total Protein: 7.1 g/dL (ref 6.5–8.1)

## 2022-09-01 LAB — MAGNESIUM: Magnesium: 2.3 mg/dL (ref 1.7–2.4)

## 2022-09-01 LAB — GLUCOSE, CAPILLARY
Glucose-Capillary: 167 mg/dL — ABNORMAL HIGH (ref 70–99)
Glucose-Capillary: 213 mg/dL — ABNORMAL HIGH (ref 70–99)
Glucose-Capillary: 185 mg/dL — ABNORMAL HIGH (ref 70–99)

## 2022-09-01 LAB — TSH: TSH: 2.329 u[IU]/mL (ref 0.350–4.500)

## 2022-09-01 LAB — PROTIME-INR
INR: 1.3 — ABNORMAL HIGH (ref 0.8–1.2)
Prothrombin Time: 15.8 seconds — ABNORMAL HIGH (ref 11.4–15.2)

## 2022-09-01 LAB — DIGOXIN LEVEL: Digoxin Level: 0.4 ng/mL — ABNORMAL LOW (ref 0.8–2.0)

## 2022-09-01 LAB — MRSA NEXT GEN BY PCR, NASAL: MRSA by PCR Next Gen: NOT DETECTED

## 2022-09-01 LAB — BRAIN NATRIURETIC PEPTIDE: B Natriuretic Peptide: 2022.4 pg/mL — ABNORMAL HIGH (ref 0.0–100.0)

## 2022-09-01 LAB — APTT: aPTT: 30 seconds (ref 24–36)

## 2022-09-01 SURGERY — RIGHT HEART CATH
Anesthesia: LOCAL

## 2022-09-01 MED ORDER — HEPARIN (PORCINE) IN NACL 1000-0.9 UT/500ML-% IV SOLN
INTRAVENOUS | Status: DC | PRN
  Administered 2022-09-01: 500 mL

## 2022-09-01 MED ORDER — LIDOCAINE HCL (PF) 1 % IJ SOLN
INTRAMUSCULAR | Status: DC | PRN
  Administered 2022-09-01: 2 mL

## 2022-09-01 MED ORDER — SODIUM CHLORIDE 0.9 % IV SOLN: 250.0000 mL | INTRAVENOUS | Status: DC | PRN

## 2022-09-01 MED ORDER — ALPRAZOLAM 0.25 MG PO TABS
0.2500 mg | ORAL_TABLET | Freq: Two times a day (BID) | ORAL | Status: DC | PRN
  Administered 2022-09-01: 0.25 mg via ORAL
  Filled 2022-09-01: qty 1

## 2022-09-01 MED ORDER — SODIUM CHLORIDE 0.9% FLUSH: 3.0000 mL | Freq: Two times a day (BID) | INTRAVENOUS | Status: DC

## 2022-09-01 MED ORDER — DAPAGLIFLOZIN PROPANEDIOL 10 MG PO TABS
10.0000 mg | ORAL_TABLET | Freq: Every day | ORAL | Status: DC
  Administered 2022-09-02: 10 mg via ORAL
  Filled 2022-09-01: qty 1

## 2022-09-01 MED ORDER — SODIUM CHLORIDE 0.9% FLUSH
3.0000 mL | Freq: Two times a day (BID) | INTRAVENOUS | Status: DC
  Administered 2022-09-01 – 2022-09-02 (×2): 3 mL via INTRAVENOUS

## 2022-09-01 MED ORDER — HEPARIN (PORCINE) IN NACL 1000-0.9 UT/500ML-% IV SOLN
INTRAVENOUS | Status: AC
  Filled 2022-09-01: qty 500

## 2022-09-01 MED ORDER — ACETAMINOPHEN 325 MG PO TABS: 650.0000 mg | ORAL_TABLET | ORAL | Status: DC | PRN

## 2022-09-01 MED ORDER — FUROSEMIDE 10 MG/ML IJ SOLN
40.0000 mg | Freq: Once | INTRAMUSCULAR | Status: AC
  Administered 2022-09-01: 40 mg via INTRAVENOUS
  Filled 2022-09-01: qty 4

## 2022-09-01 MED ORDER — SODIUM CHLORIDE 0.9% FLUSH: 3.0000 mL | INTRAVENOUS | Status: DC | PRN

## 2022-09-01 MED ORDER — DIGOXIN 0.0625 MG HALF TABLET
0.0625 mg | ORAL_TABLET | Freq: Every day | ORAL | Status: DC
  Administered 2022-09-02: 0.0625 mg via ORAL
  Filled 2022-09-01: qty 1

## 2022-09-01 MED ORDER — TRAZODONE HCL 50 MG PO TABS
50.0000 mg | ORAL_TABLET | Freq: Every evening | ORAL | Status: DC | PRN
  Administered 2022-09-01: 50 mg via ORAL
  Filled 2022-09-01: qty 1

## 2022-09-01 MED ORDER — SACUBITRIL-VALSARTAN 24-26 MG PO TABS
1.0000 | ORAL_TABLET | Freq: Two times a day (BID) | ORAL | Status: DC
  Administered 2022-09-01 – 2022-09-02 (×2): 1 via ORAL
  Filled 2022-09-01 (×3): qty 1

## 2022-09-01 MED ORDER — LIDOCAINE HCL (PF) 1 % IJ SOLN
INTRAMUSCULAR | Status: AC
  Filled 2022-09-01: qty 30

## 2022-09-01 MED ORDER — INSULIN ASPART 100 UNIT/ML IJ SOLN
0.0000 [IU] | Freq: Three times a day (TID) | INTRAMUSCULAR | Status: DC
  Administered 2022-09-02: 1 [IU] via SUBCUTANEOUS
  Administered 2022-09-02: 2 [IU] via SUBCUTANEOUS

## 2022-09-01 MED ORDER — SPIRONOLACTONE 12.5 MG HALF TABLET
12.5000 mg | ORAL_TABLET | Freq: Every day | ORAL | Status: DC
  Administered 2022-09-02: 12.5 mg via ORAL
  Filled 2022-09-01: qty 1

## 2022-09-01 MED ORDER — SODIUM CHLORIDE 0.9 % IV SOLN: INTRAVENOUS | Status: DC

## 2022-09-01 SURGICAL SUPPLY — 7 items
CATH BALLN WEDGE 5F 110CM (CATHETERS) IMPLANT
PACK CARDIAC CATHETERIZATION (CUSTOM PROCEDURE TRAY) ×1 IMPLANT
SHEATH GLIDE SLENDER 4/5FR (SHEATH) IMPLANT
TRANSDUCER W/STOPCOCK (MISCELLANEOUS) ×1 IMPLANT
TUBING ART PRESS 72  MALE/FEM (TUBING) ×1
TUBING ART PRESS 72 MALE/FEM (TUBING) IMPLANT
WIRE MICROINTRODUCER 60CM (WIRE) IMPLANT

## 2022-09-01 NOTE — Discharge Instructions (Signed)

## 2022-09-01 NOTE — Progress Notes (Signed)
MCS EDUCATION NOTE:   Patient met in short stay at the request of [name] in regards to VAD evaluation. VAD evaluation consent reviewed and signed by patient and designated caregiver Ricardo Zuniga (wife). Initial VAD teaching completed with pt and caregiver.   VAD educational packet including "Understanding Your Options with Advanced Heart Failure", "[place] Patient Agreement for VAD Evaluation and Potential Implantation" consent, and [employer] "Heartmate 3 Left Ventricular Device (LVAD) Patient Guide", Heartmate 3 Left Ventricular Assist System Patient Education Program DVD", "[place] HM III Patient Education", "[place] Mechanical Circulatory Support Program", and "Decision Aids for Left Ventricular Assist Device" reviewed in detail and left at bedside for continued reference.   All questions answered regarding VAD implant, hospital stay, and what to expect when discharged home living with a heart pump. Pt identified Ricardo Zuniga as his primary caregiver. Explained need for 24/7 care when pt is discharged home due to sternal precautions, adaptation to living on support, emotional support, consistent and meticulous exit site care and management, medication adherence and high volume of follow up visits with the [facility] after discharge; both pt and caregiver verbalized understanding of above.   Explained that LVAD can be implanted for two indications in the setting of advanced left ventricular heart failure treatment:  Bridge to transplant - used for patients who cannot safely wait for heart transplant without this device.  Or    Destination therapy - used for patients until end of life or recovery of heart function.  Patient and caregiver(s) acknowledge that the indication at this point in time for LVAD therapy would be for destination therapy due to advanced age.   Will plan to show patient and wife equipment tomorrow.  Reviewed and supplied a copy of home inspection check  list stressing that only three pronged grounded power outlets can be used for VAD equipment. Patient confirmed home has electrical outlets that will support the equipment along with access working telephone.  Identified the following lifestyle modifications while living on MCS:    1. No driving for at least three months and then only if doctor gives permission to do so.   2. No tub baths while pump implanted, and shower only when doctor gives permission.   3. No swimming or submersion in water while implanted with pump.   4. No contact sports or engaging in jumping activities.   5. Always have a backup controller, charged spare batteries, and battery clips nearby at all times in case of emergency.   6. Call the doctor or hospital contact person if any change in how the pump sounds, feels, or works.   7. Plan to sleep only when connected to the power module.   8. Do not sleep on your stomach.   9. Keep a backup system controller, charged batteries, battery clips, and flashlight near you during sleep in case of electrical power outage.   10. Exit site care including dressing changes, monitoring for infection, and importance of keeping percutaneous lead stabilized at all times.     Will plan for one of our current patients and his wife visited with them tomorrow to answer questions about living on and caring for someone on MCS.  Reviewed pictures of VAD drive line, site care, dressing changes, and drive line stabilization including securement attachment device and abdominal binder. Discussed with pt and family that they will be required to purchase dressing supplies as long as patient has the VAD in place.   Reinforced need for 24  hour/7 day week caregivers; pt designated Ricardo Zuniga as caregiver. He will also need to abide by sternal precautions with no lifting >10lbs, pushing, pulling and will need assistance with adapting to new life style with VAD equipment and care.   Intermacs patient  survival statistics through June 2023 reviewed with patient and caregiver as follows:                                               The patient understands that from this discussion it does not mean that they will receive the device, but that depends on an extensive evaluation process. The patient is aware of the fact that if at anytime they want to stop the evaluation process they can.  All questions have been answered at this time and contact information was provided should they encounter any further questions. They are both agreeable at this time to the evaluation process and will move forward.     Ricardo Morton RN,BSN [place] Coordinator   Office: [phone] 24/7 VAD Pager: [phone]

## 2022-09-01 NOTE — H&P (Signed)
ADVANCED HEART FAILURE H&P  Referring Physician: No ref. provider found  Primary Care: Glenda Chroman, MD Primary Cardiologist: Charletta Cousin Heart Failure: Hebert Soho  HPI: Ricardo Zuniga is a very pleasant 72 YO WM w/ HFrEF 2/2 nonischemic cardiomyopathy, T2DM and hx of acute promyelocytic leukemia s/p ATRA ([facility] 2014) that was discharged from [facility] in October 2023 after presenting with a 1 week history of orthopnea, PND & LE edema. He had a TTE prior to admission w/ LVEF of 40-45%. Echo during admit was notable for LVEF of 30%-35%. LHC/RHC during admit w/ nonobstructive CAD & moderately reduced CI (2.1 L/min/m2). He was ultimately discharged home on low dose GDMT. His cardiac history dates back to 2015 when he was found to have an LVEF of 25% felt to be triggered by endocarditis.Since D/C from [facility] in October uptitration of GDMT has been limited by hypotension. Over the past several weeks, Ricardo Zuniga reports becoming increasingly fatigued and SOB. He becomes quickly SOB when walking up 1 flight of stairs and feels that these symptoms are progressing. Most recently his medication doses were reduced at general cardiology appointment due to low Bps. He is also having frequent episodes of SVT.   Today he had a RHC that demonstrated CI of 1.85. After a lengthy discussion, we elected to admit him for expedited LVAD evaluation.   Past Medical History:  Diagnosis Date   Anal fissure    APML (acute promyelocytic leukemia) in remission ([place])    Completed treatment 04/2013   CHF (congestive heart failure) (HCC)    Coronary artery disease    Nonobstructive at cardiac catheterization 09/2018   Difficult intubation    w/shoulder surgery at surgical center between 2005-2008   Gastroesophageal reflux disease    History of blood transfusion 2014   History of kidney stones    passed stones   Hypercholesteremia    Neuropathy    bilateral feet   Nonischemic cardiomyopathy ([place])     a. EF 20% in 2015 - thought to possibly be viral induced or chemotherapy induced from treatments in 2014 b. at 35-40% by echo in 05/2016    Peptic ulcer disease    Pneumonia    Yrs ago   Type 2 diabetes mellitus (HCC)    Ureteral colic    Vertigo    tx with valium prn   Wears glasses    Wears hearing aid in both ears     Current Facility-Administered Medications  Medication Dose Route Frequency Provider Last Rate Last Admin   0.9 %  sodium chloride infusion  250 mL Intravenous PRN Earnie Larsson, NP       acetaminophen (TYLENOL) tablet 650 mg  650 mg Oral Q4H PRN Earnie Larsson, NP       ALPRAZolam Duanne Moron) tablet 0.25 mg  0.25 mg Oral BID PRN Earnie Larsson, NP       [START ON 09/02/2022] dapagliflozin propanediol (FARXIGA) tablet 10 mg  10 mg Oral Daily Earnie Larsson, NP       [START ON 09/02/2022] digoxin (LANOXIN) tablet 0.0625 mg  0.0625 mg Oral Daily Forestine Na L, NP       furosemide (LASIX) injection 40 mg  40 mg Intravenous Once Earnie Larsson, NP       sacubitril-valsartan (ENTRESTO) 24-26 mg per tablet  1 tablet Oral BID Forestine Na L, NP       sodium chloride flush (NS) 0.9 % injection 3 mL  3  mL Intravenous Q12H Forestine Na L, NP       sodium chloride flush (NS) 0.9 % injection 3 mL  3 mL Intravenous PRN Earnie Larsson, NP       [START ON 09/02/2022] spironolactone (ALDACTONE) tablet 12.5 mg  12.5 mg Oral Daily Earnie Larsson, NP        Allergies  Allergen Reactions   Lisinopril Cough      Social History   Socioeconomic History   Marital status: Married    Spouse name: Not on file   Number of children: Not on file   Years of education: Not on file   Highest education level: Not on file  Occupational History   Occupation: RETIRED    Comment: [employer]   Occupation: SECURITY GUARD  Tobacco Use   Smoking status: Former    Packs/day: 2.00    Years: 17.00    Total pack years: 34.00    Types: Cigarettes    Start date: 02/17/1968    Quit date: 02/28/1986    Years since  quitting: 36.5   Smokeless tobacco: Never  Vaping Use   Vaping Use: Never used  Substance and Sexual Activity   Alcohol use: No    Alcohol/week: 0.0 standard drinks of alcohol   Drug use: No   Sexual activity: Not Currently  Other Topics Concern   Not on file  Social History Narrative   Not on file   Social Determinants of Health   Financial Resource Strain: Not on file  Food Insecurity: No Food Insecurity (06/07/2022)   Hunger Vital Sign    Worried About Running Out of Food in the Last Year: Never true    [place] in the Last Year: Never true  Transportation Needs: No Transportation Needs (06/07/2022)   PRAPARE - Hydrologist (Medical): No    Lack of Transportation (Non-Medical): No  Physical Activity: Not on file  Stress: Not on file  Social Connections: Not on file  Intimate Partner Violence: Not At Risk (06/07/2022)   Humiliation, Afraid, Rape, and Kick questionnaire    Fear of Current or Ex-Partner: No    Emotionally Abused: No    Physically Abused: No    Sexually Abused: No      Family History  Problem Relation Age of Onset   Colon cancer Other     PHYSICAL EXAM: Vitals:   09/01/22 1623 09/01/22 1724  BP: 131/82 136/74  Pulse: 83 92  Resp:  (!) 22  Temp:  97.6 F (36.4 C)  SpO2: 95% 96%   GENERAL: Well nourished, well developed, and in no apparent distress at rest.  HEENT: Negative for arcus senilis or xanthelasma. There is no scleral icterus.  The mucous membranes are pink and moist.   NECK: Supple, No masses. Normal carotid upstrokes without bruits. No masses or thyromegaly.    CHEST: There are no chest wall deformities. There is no chest wall tenderness. Respirations are unlabored.  Lungs- CTA B/L CARDIAC:  JVP: 9 cm H2O         Normal S1, S2  Normal rate with regular rhythm. No murmurs, rubs or gallops.  Pulses are 2+ and symmetrical in upper and lower extremities. 1+ edema.  ABDOMEN: Soft, non-tender,  non-distended. There are no masses or hepatomegaly. There are normal bowel sounds.  EXTREMITIES: Warm and well perfused with no cyanosis, clubbing.  LYMPHATIC: No axillary or supraclavicular lymphadenopathy.  NEUROLOGIC: Patient is oriented x3 with  no focal or lateralizing neurologic deficits.  PSYCH: Patients affect is appropriate, there is no evidence of anxiety or depression.  SKIN: Warm and dry; no lesions or wounds.   DATA REVIEW  ECG:NSR  ECHO: - Echo (06/08/22): EF 30-35%, global HK, RV mildly reduced, mild MR   - Echo (7/23): EF 45%, RV normal   - Echo (8/22): EF 40-45%, RV normal   - Echo (7/21): EF 50%, RV normal  - LHC (09/2018): Moderate nonobstructive one-vessel coronary artery disease, prox RCA 40% stenosed.   - Echo (08/2018): EF 40%, RV normal  - Echo (05/2016): EF 35-40%, RV normal   - Echo (05/2014): EF 25-30%, RV mildly reduced  - Echo (05/2012): EF 50%, RV normal   CATH: R/LHC (10/23): nonobstructive CAD RA 8 PA 52/25 (34 mean) PCWP 14 (mean) CO/CI (Fick): 4.3/2.1                                 PVR 4.6 WU          PAPi 3.4               RHC 09/01/22: HEMODYNAMICS: RA:                  10 mmHg (mean) RV:                  65/10-15 mmHg PA:                  69/28 mmHg (43 mean) PCWP:            20 mmHg (mean)                                      Estimated Fick CO/CI   1.85 L/min, 3.7 L/min/m2                                            TPG                 23  mmHg                                            PVR                 6.22 Wood Units  PAPi                4.1    ASSESSMENT & PLAN:  Heart failure with reduced ejection fraction - NYHA IV, INTERMACS IV.  - Nonischemic cardiomyopathy with progressively worsening LV dilation; RHC today with cardiac index of 1.85 - Plan to admit for expedited LVAD evaluation in the setting of decreasing cardiac index and frequent ectopy.  - IV lasix '40mg'$  x 1 today.   2. Pulmonary hypertension - Combined pre and post  capillary PH; will obtain PFTs, CT scan to assess for parenchymal lung disease. A large component of this is likely Group 2; there is likely some fixed component of PH now but I do not believe this will be a contraindication to LVAD in the setting of preserved RV function.   3. SVT/NSVT -  Continue digoxin 0.0634mg - Amiodarone '200mg'$  BID  4. Hx of promyelocytic [facility]   5. Hx of endocarditis - 2015   Eiza Canniff Advanced Heart Failure Mechanical Circulatory Support

## 2022-09-01 NOTE — Interval H&P Note (Signed)
History and Physical Interval Note:  09/01/2022 10:37 AM  Ricardo Zuniga  has presented today for surgery, with the diagnosis of Heart Failure.  The various methods of treatment have been discussed with the patient and family. After consideration of risks, benefits and other options for treatment, the patient has consented to  Procedure(s): RIGHT HEART CATH (N/A) as a surgical intervention.  The patient's history has been reviewed, patient examined, no change in status, stable for surgery.  I have reviewed the patient's chart and labs.  Questions were answered to the patient's satisfaction.     Georges Victorio

## 2022-09-02 ENCOUNTER — Inpatient Hospital Stay (HOSPITAL_COMMUNITY): Payer: Medicare PPO

## 2022-09-02 ENCOUNTER — Other Ambulatory Visit (HOSPITAL_COMMUNITY): Payer: Self-pay

## 2022-09-02 ENCOUNTER — Encounter (HOSPITAL_COMMUNITY): Payer: Self-pay | Admitting: Cardiology

## 2022-09-02 DIAGNOSIS — I5021 Acute systolic (congestive) heart failure: Secondary | ICD-10-CM

## 2022-09-02 DIAGNOSIS — Z7189 Other specified counseling: Secondary | ICD-10-CM

## 2022-09-02 DIAGNOSIS — I5023 Acute on chronic systolic (congestive) heart failure: Secondary | ICD-10-CM | POA: Diagnosis not present

## 2022-09-02 DIAGNOSIS — Z515 Encounter for palliative care: Secondary | ICD-10-CM

## 2022-09-02 DIAGNOSIS — Z01810 Encounter for preprocedural cardiovascular examination: Secondary | ICD-10-CM | POA: Diagnosis not present

## 2022-09-02 LAB — PULMONARY FUNCTION TEST
DL/VA % pred: 101 %
DL/VA: 4.11 ml/min/mmHg/L
DLCO cor % pred: 64 %
DLCO cor: 15.89 ml/min/mmHg
DLCO unc % pred: 62 %
DLCO unc: 15.61 ml/min/mmHg
FEF 25-75 Post: 4.32 L/sec
FEF 25-75 Pre: 3.39 L/sec
FEF2575-%Change-Post: 27 %
FEF2575-%Pred-Post: 189 %
FEF2575-%Pred-Pre: 148 %
FEV1-%Change-Post: 5 %
FEV1-%Pred-Post: 74 %
FEV1-%Pred-Pre: 70 %
FEV1-Post: 2.28 L
FEV1-Pre: 2.16 L
FEV1FVC-%Change-Post: 3 %
FEV1FVC-%Pred-Pre: 117 %
FEV6-%Change-Post: 1 %
FEV6-%Pred-Post: 64 %
FEV6-%Pred-Pre: 63 %
FEV6-Post: 2.53 L
FEV6-Pre: 2.49 L
FEV6FVC-%Change-Post: 0 %
FEV6FVC-%Pred-Post: 105 %
FEV6FVC-%Pred-Pre: 105 %
FVC-%Change-Post: 1 %
FVC-%Pred-Post: 60 %
FVC-%Pred-Pre: 60 %
FVC-Post: 2.55 L
FVC-Pre: 2.51 L
Post FEV1/FVC ratio: 89 %
Post FEV6/FVC ratio: 99 %
Pre FEV1/FVC ratio: 86 %
Pre FEV6/FVC Ratio: 99 %
RV % pred: 74 %
RV: 1.82 L
TLC % pred: 63 %
TLC: 4.31 L

## 2022-09-02 LAB — COMPREHENSIVE METABOLIC PANEL
ALT: 24 U/L (ref 0–44)
AST: 16 U/L (ref 15–41)
Albumin: 3.5 g/dL (ref 3.5–5.0)
Alkaline Phosphatase: 58 U/L (ref 38–126)
Anion gap: 10 (ref 5–15)
BUN: 19 mg/dL (ref 8–23)
CO2: 24 mmol/L (ref 22–32)
Calcium: 8.7 mg/dL — ABNORMAL LOW (ref 8.9–10.3)
Chloride: 105 mmol/L (ref 98–111)
Creatinine, Ser: 1.11 mg/dL (ref 0.61–1.24)
GFR, Estimated: >60 mL/min (ref >60)
Glucose, Bld: 122 mg/dL — ABNORMAL HIGH (ref 70–99)
Potassium: 3.1 mmol/L — ABNORMAL LOW (ref 3.5–5.1)
Sodium: 139 mmol/L (ref 135–145)
Total Bilirubin: 1.1 mg/dL (ref 0.3–1.2)
Total Protein: 6.6 g/dL (ref 6.5–8.1)

## 2022-09-02 LAB — CBC WITH DIFFERENTIAL/PLATELET
Abs Immature Granulocytes: 0.01 10*3/uL (ref 0.00–0.07)
Basophils Absolute: 0 10*3/uL (ref 0.0–0.1)
Basophils Relative: 1 %
Eosinophils Absolute: 0.1 10*3/uL (ref 0.0–0.5)
Eosinophils Relative: 3 %
HCT: 39.8 % (ref 39.0–52.0)
Hemoglobin: 14 g/dL (ref 13.0–17.0)
Immature Granulocytes: 0 %
Lymphocytes Relative: 18 %
Lymphs Abs: 0.9 10*3/uL (ref 0.7–4.0)
MCH: 30.7 pg (ref 26.0–34.0)
MCHC: 35.2 g/dL (ref 30.0–36.0)
MCV: 87.3 fL (ref 80.0–100.0)
Monocytes Absolute: 0.4 10*3/uL (ref 0.1–1.0)
Monocytes Relative: 9 %
Neutro Abs: 3.3 10*3/uL (ref 1.7–7.7)
Neutrophils Relative %: 69 %
Platelets: 192 10*3/uL (ref 150–400)
RBC: 4.56 MIL/uL (ref 4.22–5.81)
RDW: 15.7 % — ABNORMAL HIGH (ref 11.5–15.5)
WBC: 4.7 10*3/uL (ref 4.0–10.5)
nRBC: 0 % (ref 0.0–0.2)

## 2022-09-02 LAB — ECHOCARDIOGRAM COMPLETE
AR max vel: 2.3 cm2
AV Area VTI: 2.51 cm2
AV Area mean vel: 2.23 cm2
AV Mean grad: 3 mmHg
AV Peak grad: 4.6 mmHg
Ao pk vel: 1.07 m/s
Area-P 1/2: 4.74 cm2
Calc EF: 27.6 %
Est EF: 25
Height: 69 in
MV M vel: 2.93 m/s
MV Peak grad: 34.3 mmHg
P 1/2 time: 226 msec
S' Lateral: 5.1 cm
Single Plane A2C EF: 26.4 %
Single Plane A4C EF: 29.5 %
Weight: 2952.4 oz

## 2022-09-02 LAB — RAPID URINE DRUG SCREEN, HOSP PERFORMED
Amphetamines: NOT DETECTED
Barbiturates: NOT DETECTED
Benzodiazepines: NOT DETECTED
Cocaine: NOT DETECTED
Opiates: NOT DETECTED
Tetrahydrocannabinol: NOT DETECTED

## 2022-09-02 LAB — HEPATITIS B CORE ANTIBODY, TOTAL: Hep B Core Total Ab: NONREACTIVE

## 2022-09-02 LAB — HEMOGLOBIN A1C
Hgb A1c MFr Bld: 7.4 % — ABNORMAL HIGH (ref 4.8–5.6)
Mean Plasma Glucose: 165.68 mg/dL

## 2022-09-02 LAB — GLUCOSE, CAPILLARY
Glucose-Capillary: 143 mg/dL — ABNORMAL HIGH (ref 70–99)
Glucose-Capillary: 183 mg/dL — ABNORMAL HIGH (ref 70–99)

## 2022-09-02 LAB — LIPID PANEL
Cholesterol: 134 mg/dL (ref 0–200)
HDL: 34 mg/dL — ABNORMAL LOW (ref >40)
LDL Cholesterol: 85 mg/dL (ref 0–99)
Total CHOL/HDL Ratio: 3.9 RATIO
Triglycerides: 75 mg/dL (ref <150)
VLDL: 15 mg/dL (ref 0–40)

## 2022-09-02 LAB — HIV ANTIBODY (ROUTINE TESTING W REFLEX): HIV Screen 4th Generation wRfx: NONREACTIVE

## 2022-09-02 LAB — MAGNESIUM: Magnesium: 2.1 mg/dL (ref 1.7–2.4)

## 2022-09-02 LAB — PROTIME-INR
INR: 1.3 — ABNORMAL HIGH (ref 0.8–1.2)
Prothrombin Time: 15.8 seconds — ABNORMAL HIGH (ref 11.4–15.2)

## 2022-09-02 LAB — APTT: aPTT: 30 seconds (ref 24–36)

## 2022-09-02 LAB — HEPATITIS C ANTIBODY: HCV Ab: NONREACTIVE

## 2022-09-02 LAB — VAS US DOPPLER PRE VAD
Left ABI: 1.18
Right ABI: 1.18

## 2022-09-02 LAB — URINALYSIS, ROUTINE W REFLEX MICROSCOPIC
Bacteria, UA: NONE SEEN
Bilirubin Urine: NEGATIVE
Glucose, UA: >=500 mg/dL — AB
Hgb urine dipstick: NEGATIVE
Ketones, ur: 5 mg/dL — AB
Leukocytes,Ua: NEGATIVE
Nitrite: NEGATIVE
Protein, ur: NEGATIVE mg/dL
Specific Gravity, Urine: >1.046 — ABNORMAL HIGH (ref 1.005–1.030)
pH: 6 (ref 5.0–8.0)

## 2022-09-02 LAB — HEPATITIS B SURFACE ANTIGEN: Hepatitis B Surface Ag: NONREACTIVE

## 2022-09-02 LAB — PREALBUMIN: Prealbumin: 20 mg/dL (ref 18–38)

## 2022-09-02 LAB — T4, FREE: Free T4: 1.15 ng/dL — ABNORMAL HIGH (ref 0.61–1.12)

## 2022-09-02 LAB — URIC ACID: Uric Acid, Serum: 7.2 mg/dL (ref 3.7–8.6)

## 2022-09-02 LAB — ABO/RH: ABO/RH(D): A POS

## 2022-09-02 LAB — LACTATE DEHYDROGENASE: LDH: 137 U/L (ref 98–192)

## 2022-09-02 LAB — ANTITHROMBIN III: AntiThromb III Func: 98 % (ref 75–120)

## 2022-09-02 MED ORDER — ATORVASTATIN CALCIUM 40 MG PO TABS
40.0000 mg | ORAL_TABLET | Freq: Every day | ORAL | Status: DC
  Administered 2022-09-02: 40 mg via ORAL
  Filled 2022-09-02: qty 1

## 2022-09-02 MED ORDER — ENOXAPARIN SODIUM 40 MG/0.4ML IJ SOSY
40.0000 mg | PREFILLED_SYRINGE | INTRAMUSCULAR | Status: DC
  Administered 2022-09-02: 40 mg via SUBCUTANEOUS
  Filled 2022-09-02: qty 0.4

## 2022-09-02 MED ORDER — TRAZODONE HCL 50 MG PO TABS
50.0000 mg | ORAL_TABLET | Freq: Every evening | ORAL | 1 refills | Status: DC | PRN
  Filled 2022-09-02: qty 30, 30d supply, fill #0

## 2022-09-02 MED ORDER — IOHEXOL 350 MG/ML SOLN
75.0000 mL | Freq: Once | INTRAVENOUS | Status: AC | PRN
  Administered 2022-09-02: 75 mL via INTRAVENOUS

## 2022-09-02 MED ORDER — ALBUTEROL SULFATE (2.5 MG/3ML) 0.083% IN NEBU
2.5000 mg | INHALATION_SOLUTION | Freq: Once | RESPIRATORY_TRACT | Status: AC
  Administered 2022-09-02: 2.5 mg via RESPIRATORY_TRACT

## 2022-09-02 MED ORDER — FUROSEMIDE 10 MG/ML IJ SOLN
40.0000 mg | Freq: Two times a day (BID) | INTRAMUSCULAR | Status: DC
  Administered 2022-09-02: 40 mg via INTRAVENOUS
  Filled 2022-09-02: qty 4

## 2022-09-02 MED ORDER — ADULT MULTIVITAMIN W/MINERALS CH: 1.0000 | ORAL_TABLET | Freq: Every day | ORAL | Status: DC

## 2022-09-02 MED ORDER — PERFLUTREN LIPID MICROSPHERE
1.0000 mL | INTRAVENOUS | Status: AC | PRN
  Administered 2022-09-02: 2 mL via INTRAVENOUS

## 2022-09-02 MED ORDER — ADULT MULTIVITAMIN W/MINERALS CH
1.0000 | ORAL_TABLET | Freq: Every day | ORAL | 5 refills | Status: DC
  Filled 2022-09-02: qty 30, 30d supply, fill #0

## 2022-09-02 MED ORDER — DIGOXIN 62.5 MCG PO TABS
0.0625 mg | ORAL_TABLET | Freq: Every day | ORAL | 5 refills | Status: DC
  Filled 2022-09-02: qty 30, 30d supply, fill #0

## 2022-09-02 MED ORDER — POTASSIUM CHLORIDE CRYS ER 20 MEQ PO TBCR
40.0000 meq | EXTENDED_RELEASE_TABLET | ORAL | Status: AC
  Administered 2022-09-02 (×2): 40 meq via ORAL
  Filled 2022-09-02 (×2): qty 2

## 2022-09-02 NOTE — Progress Notes (Signed)
Pre-VAD testing has been completed. Preliminary results can be found in CV Proc through chart review.   09/02/22 4:13 PM Ricardo Zuniga RVT

## 2022-09-02 NOTE — Consult Note (Signed)
Consultation Note Date: 09/02/2022   Patient Name: Ricardo Zuniga  DOB: [birth year]  MRN: [id number]  Age / Sex: 72 y.o., male  PCP: Glenda Chroman, MD Referring Physician: Hebert Soho, DO  Reason for Consultation: VAD evaluation  HPI/Patient Profile: 72 y.o. male  with past medical history of HFrEF EF 30-35%, NICM, acute promyelocytic leukemia in remission s/p ATRA (follows at [facility]) diabetes, hypertension, CAD, neuropathy, GERD admitted on 09/01/2022 with VAD evaluation.   Clinical Assessment and Goals of Care: Consult received and extensive chart review completed. I met today with Mortimer Fries and wife, [name], at bedside. [name] is a retired Therapist, sports. They both have good understanding of LVAD process, procedure, and recovery expectations. They understand risks involved. Mortimer Fries shares that he was not at all interested in LVAD when this was first mentioned but as he has learned more over time it has become a more appealing option if the expectation is that LVAD can provide him more quality time with his family. He is very motivated and very active with excellent support system.   Mortimer Fries and [name] share they are working on [employer] and have had the difficult discussions over the years of what Mortimer Fries would want. He wants to be able to live as well as he can for as long as he can. He would not desire artificial measures to prolong life long term and would not want tracheostomy as this would not provide him with acceptable quality of life. [name] confirms that she knows Bobby's wishes and feels she would know what he would desire and would feel comfortable speaking on his behalf if needed. They have a strong faith and Mortimer Fries is not afraid to die but is hopeful for more time with his family.   All questions/concerns addressed. Emotional support provided.   Primary Decision Maker PATIENT    SUMMARY OF RECOMMENDATIONS    - Good candidate for LVAD. No concerns from palliative standpoint.   Code Status/Advance Care Planning: Full code   Symptom Management:  Per heart failure team.   Prognosis:  Unable to determine  Discharge Planning: Home with Home Health      Primary Diagnoses: Present on Admission:  Acute on chronic systolic (congestive) heart failure ([place])   I have reviewed the medical record, interviewed the patient and family, and examined the patient. The following aspects are pertinent.  Past Medical History:  Diagnosis Date   Anal fissure    APML (acute promyelocytic leukemia) in remission [facility])    Completed treatment 04/2013   CHF (congestive heart failure) (HCC)    Coronary artery disease    Nonobstructive at cardiac catheterization 09/2018   Difficult intubation    w/shoulder surgery at surgical center between 2005-2008   Gastroesophageal reflux disease    History of blood transfusion 2014   History of kidney stones    passed stones   Hypercholesteremia    Neuropathy    bilateral feet   Nonischemic cardiomyopathy ([place])    a. EF 20% in 2015 - thought to possibly  be viral induced or chemotherapy induced from treatments in 2014 b. at 35-40% by echo in 05/2016    Peptic ulcer disease    Pneumonia    Yrs ago   Type 2 diabetes mellitus (HCC)    Ureteral colic    Vertigo    tx with valium prn   Wears glasses    Wears hearing aid in both ears    Social History   Socioeconomic History   Marital status: Married    Spouse name: Not on file   Number of children: Not on file   Years of education: Not on file   Highest education level: Not on file  Occupational History   Occupation: RETIRED    Comment: [employer]   Occupation: SECURITY GUARD  Tobacco Use   Smoking status: Former    Packs/day: 2.00    Years: 17.00    Total pack years: 34.00    Types: Cigarettes    Start date: 02/17/1968    Quit date: 02/28/1986    Years since quitting: 36.5   Smokeless tobacco:  Never  Vaping Use   Vaping Use: Never used  Substance and Sexual Activity   Alcohol use: No    Alcohol/week: 0.0 standard drinks of alcohol   Drug use: No   Sexual activity: Not Currently  Other Topics Concern   Not on file  Social History Narrative   Not on file   Social Determinants of Health   Financial Resource Strain: Not on file  Food Insecurity: No Food Insecurity (09/01/2022)   Hunger Vital Sign    Worried About Running Out of Food in the Last Year: Never true    Ran Out of Food in the Last Year: Never true  Transportation Needs: No Transportation Needs (09/01/2022)   PRAPARE - Hydrologist (Medical): No    Lack of Transportation (Non-Medical): No  Physical Activity: Not on file  Stress: Not on file  Social Connections: Not on file   Family History  Problem Relation Age of Onset   Colon cancer Other    Scheduled Meds:  atorvastatin  40 mg Oral Daily   dapagliflozin propanediol  10 mg Oral Daily   digoxin  0.0625 mg Oral Daily   enoxaparin (LOVENOX) injection  40 mg Subcutaneous Q24H   furosemide  40 mg Intravenous BID   insulin aspart  0-9 Units Subcutaneous TID WC   potassium chloride  40 mEq Oral Q4H   sacubitril-valsartan  1 tablet Oral BID   sodium chloride flush  3 mL Intravenous Q12H   spironolactone  12.5 mg Oral Daily   Continuous Infusions:  sodium chloride     PRN Meds:.sodium chloride, acetaminophen, ALPRAZolam, sodium chloride flush, traZODone Allergies  Allergen Reactions   Lisinopril Cough   Review of Systems  Physical Exam  Vital Signs: BP 123/70 (BP Location: Left Arm)   Pulse 88   Temp 97.6 F (36.4 C) (Oral)   Resp 13   Ht 5' 9"$  (1.753 m)   Wt 83.7 kg   SpO2 96%   BMI 27.25 kg/m  Pain Scale: 0-10 POSS *See Group Information*: 1-Acceptable,Awake and alert Pain Score: 0-No pain   SpO2: SpO2: 96 % O2 Device:SpO2: 96 % O2 Flow Rate: .   IO: Intake/output summary:  Intake/Output Summary (Last 24  hours) at 09/02/2022 1439 Last data filed at 09/02/2022 1300 Gross per 24 hour  Intake 963 ml  Output 3575 ml  Net -2612 ml  LBM: Last BM Date : 09/01/22 Baseline Weight: Weight: 84.4 kg Most recent weight: Weight: 83.7 kg     Palliative Assessment/Data:      Time Total: 75 min  Greater than 50%  of this time was spent counseling and coordinating care related to the above assessment and plan.  Signed by: Vinie Sill, NP Palliative Medicine Team Pager # [phone] (M-F 8a-5p) Team Phone # [phone] (Nights/Weekends)

## 2022-09-02 NOTE — Progress Notes (Signed)
Heart Failure Navigator Progress Note  Assessed for Heart & Vascular TOC clinic readiness.  Patient does not meet criteria due to Advanced Heart Failure Team patient.   Navigator will sign off at this time.    Adilyn Humes, BSN, RN Heart Failure Nurse Navigator Secure Chat Only   

## 2022-09-02 NOTE — Progress Notes (Signed)
VAD Coordinator note:  Met with pt this morning. He slept well last night. Discussed VAD again. Questions answered. Pt has already CT scan this morning. He will have dopplers, scans and echo around 1100 and PFTs this afternoon. Pt has not been seen by dietician, palliative, surgeon or social yet. I have arranged for a pt and his wife to meet with the [place] today to discuss VAD. Will continue to follow during evaluation process. Hopefully will be able to d/c tonight or in the morning.  Tanda Rockers RN, BSN VAD Coordinator 24/7 Pager [phone]

## 2022-09-02 NOTE — Progress Notes (Signed)
Mobility Specialist Progress Note    09/02/22 1009  Mobility  Activity Ambulated independently in hallway  Level of Assistance Independent  Assistive Device None  Distance Ambulated (ft) 420 ft  Activity Response Tolerated well  $Mobility charge 1 Mobility   Pre-Mobility: 80 HR, 113/70(84) BP Post-Mobility: 94 HR  Pt received in bed and agreeable. No complaints on walk. Returned to sitting EOB with call bell in reach.    Hildred Alamin Mobility Specialist  Please Psychologist, sport and exercise or Rehab Office at [phone]

## 2022-09-02 NOTE — Progress Notes (Signed)
Advanced Heart Failure Rounding Note  PCP-Cardiologist: [name], MD   Subjective:    Feels good this morning. Denies CP/SOB. Ambulated with mobility specialist with no difficulties.   LVAD workup: Orthopantogram completed yesterday. PFTs pending today.   Chest CT: New irregular solid pulmonary nodule of the posterior left upper lobe measuring 6 mm   Objective:   Weight Range: 83.7 kg Body mass index is 27.25 kg/m.   Vital Signs:   Temp:  [97.6 F (36.4 C)-98 F (36.7 C)] 97.6 F (36.4 C) (01/23 0744) Pulse Rate:  [78-100] 88 (01/23 0744) Resp:  [13-20] 13 (01/23 0744) BP: (101-136)/(59-84) 123/70 (01/23 0744) SpO2:  [94 %-97 %] 96 % (01/23 0744) Weight:  [83.7 kg-84.4 kg] 83.7 kg (01/23 0350) Last BM Date : 09/01/22  Weight change: Filed Weights   09/01/22 1025 09/02/22 0350  Weight: 84.4 kg 83.7 kg    Intake/Output:   Intake/Output Summary (Last 24 hours) at 09/02/2022 0747 Last data filed at 09/02/2022 0746 Gross per 24 hour  Intake 963 ml  Output 2025 ml  Net -1062 ml      Physical Exam    General:  well appearing.  No respiratory difficulty HEENT: normal Neck: supple. JVD ~10 cm. Carotids 2+ bilat; no bruits. No lymphadenopathy or thyromegaly appreciated. Cor: PMI nondisplaced. Regular rate & rhythm. No rubs, gallops or murmurs. Lungs: clear Abdomen: soft, nontender, nondistended. No hepatosplenomegaly. No bruits or masses. Good bowel sounds. Extremities: no cyanosis, clubbing, rash, +1 ankle edema  Neuro: alert & oriented x 3, cranial nerves grossly intact. moves all 4 extremities w/o difficulty. Affect pleasant.   Telemetry   NSR 70s 0-14 PVCs (Personally reviewed)    EKG    NSR with frequent PVCs 80 bpm  Labs    CBC Recent Labs    09/01/22 1817 09/02/22 0547  WBC 5.1 4.7  NEUTROABS 3.5 3.3  HGB 14.6 14.0  HCT 41.0 39.8  MCV 87.2 87.3  PLT 195 202   Basic Metabolic Panel Recent Labs    09/01/22 1817 09/02/22 0547   NA 138 139  K 3.2* 3.1*  CL 102 105  CO2 23 24  GLUCOSE 140* 122*  BUN 18 19  CREATININE 1.14 1.11  CALCIUM 9.1 8.7*  MG 2.3 2.1   Liver Function Tests Recent Labs    09/01/22 1817 09/02/22 0547  AST 18 16  ALT 24 24  ALKPHOS 61 58  BILITOT 1.1 1.1  PROT 7.1 6.6  ALBUMIN 3.7 3.5   No results for input(s): "LIPASE", "AMYLASE" in the last 72 hours. Cardiac Enzymes No results for input(s): "CKTOTAL", "CKMB", "CKMBINDEX", "TROPONINI" in the last 72 hours.  BNP: BNP (last 3 results) Recent Labs    08/01/22 1526 08/19/22 1256 09/01/22 1817  BNP 2,022.0* 1,916.6* 2,022.4*    ProBNP (last 3 results) No results for input(s): "PROBNP" in the last 8760 hours.   D-Dimer No results for input(s): "DDIMER" in the last 72 hours. Hemoglobin A1C Recent Labs    09/02/22 0547  HGBA1C 7.4*   Fasting Lipid Panel Recent Labs    09/02/22 0547  CHOL 134  HDL 34*  LDLCALC 85  TRIG 75  CHOLHDL 3.9   Thyroid Function Tests Recent Labs    09/01/22 1817  TSH 2.329    Other results:   Imaging    DG Orthopantogram  Result Date: 09/01/2022 CLINICAL DATA:  Preoperative evaluation EXAM: ORTHOPANTOGRAM/PANORAMIC COMPARISON:  None Available. FINDINGS: Scattered dental fillings. Small dental caries noted  at RIGHT third maxillary molar. No periodontal lucencies identified. IMPRESSION: Small dental caries at RIGHT third maxillary molar. No periodontal disease identified. Electronically Signed   By: Lavonia Dana M.D.   On: 09/01/2022 19:40   CARDIAC CATHETERIZATION  Result Date: 09/01/2022 HEMODYNAMICS: RA:   10 mmHg (mean) RV:   65/10-15 mmHg PA:   69/28 mmHg (43 mean) PCWP:  20 mmHg (mean)    Estimated Fick CO/CI   1.85 L/min, 3.7 L/min/m2    TPG    23  mmHg     PVR     6.22 Wood Units PAPi      4.1  IMPRESSION: Elevated pre and post capillary filling pressures Elevated PA mean and PVR secondary to combined pre and post capillary pulmonary hypertension. Severely reduced cardiac  index. Hemodynamic parameters suggestive of preserved RV function. Frequent PVCs and ectopy during case. RECOMMENDATIONS: Admit for expedited LVAD evaluation. Aditya Sabharwal Advanced Heart Failure 5:39 PM    Medications:     Scheduled Medications:  dapagliflozin propanediol  10 mg Oral Daily   digoxin  0.0625 mg Oral Daily   insulin aspart  0-9 Units Subcutaneous TID WC   sacubitril-valsartan  1 tablet Oral BID   sodium chloride flush  3 mL Intravenous Q12H   spironolactone  12.5 mg Oral Daily    Infusions:  sodium chloride      PRN Medications: sodium chloride, acetaminophen, ALPRAZolam, sodium chloride flush, traZODone    Patient Profile   Ricardo Zuniga is a very pleasant 72 YO WM w/ HFrEF 2/2 nonischemic cardiomyopathy, T2DM and hx of acute promyelocytic leukemia s/p ATRA ([facility] 2014). Admitted after [place] for VAD workup.   Assessment/Plan   Heart failure with reduced ejection fraction - NYHA IV, INTERMACS IV.  - Nonischemic cardiomyopathy with progressively worsening LV dilation; RHC 1/22 with CI of 1.85 - Admitted for expedited LVAD evaluation in the setting of decreasing cardiac index and frequent ectopy.  - Volume remains slightly elevated. Good UOP with IV lasix last night. Will continue with 40 IV lasix BID - Continue digoxin .5681  - Hgb A1c 7.4, continue farxiga   2. Pulmonary hypertension - Combined pre and post capillary PH; will obtain PFTs, CT scan to assess for parenchymal lung disease. A large component of this is likely Group 2; there is likely some fixed component of PH now but I do not believe this will be a contraindication to LVAD in the setting of preserved RV function.    3. SVT/NSVT - Continue digoxin 0.0680mg - Continue Amiodarone '200mg'$  BID   4. Hx of prom[facility]    5. Hx of endocarditis - 2015  6. Hypokalemia - 3.1 today, will replete - Mg 2.1  7. Hyperlipidemia - LDL 85, continue home  statin   Length of Stay: 1[place] NP  09/02/2022, 7:47 AM  Advanced Heart Failure Team Pager [phone](M-F; 7a - 5p)  Please contact [facility] for night-coverage after hours (5p -7a ) and weekends on [email or web address]

## 2022-09-02 NOTE — Discharge Summary (Signed)
Advanced Heart Failure Team  Discharge Summary   Patient ID: Ricardo Zuniga MRN: [id number], DOB/AGE: 1951-02-17 72 y.o. Admit date: 09/01/2022 D/C date:     09/02/2022   Primary Discharge Diagnoses:  Heart failure with reduced ejection fraction  Secondary Discharge Diagnoses:   Pulmonary hypertension SVT/NSVT Hx of promyelocytic leukemia Hx of endocarditis Hypokalemia Hyperlipidemia  Hospital Course:  Ricardo Zuniga is a very pleasant 72 YO WM w/ HFrEF 2/2 nonischemic cardiomyopathy, T2DM and hx of acute promyelocytic leukemia s/p ATRA ([facility] 2014) . Has been followed in the AHF clinic. Has reported increased SOB and fatigue. GDMT has had to be decreased. RHC scheduled.   RHC showed CI of 1.85. Admitted for LVAD workup. Overall patient did well during admission. Diuresed with IV lasix. Had CT, dopplers, echo and PFTs completed. Saw dietary, palliative, SW, VAD coordinators and Psychologist, sport and exercise. VAD workup complete.  Plan for MRB review soon. Dr. Tenny Zuniga saw and set tentative date (pending MRB approval) for 2/14.   Pt will continue to be followed closely in the HF clinic. Dr Ricardo Zuniga evaluated and deemed appropriate for discharge. Once dates set, will plan to admit 2-3 days prior to scheduled LVAD implant date.   See below for detailed problem list:  Heart failure with reduced ejection fraction - NYHA IV, INTERMACS IV.  - Nonischemic cardiomyopathy with progressively worsening LV dilation; RHC 1/22 with CI of 1.85 - Admitted for expedited LVAD evaluation in the setting of decreasing cardiac index and frequent ectopy.  - Volume remains slightly elevated. Good UOP with IV lasix last night. Will continue with 40 IV lasix BID. Then will continue home dose 40 PO BID - Continue digoxin .5009  - Hgb A1c 7.4, continue farxiga 2. Pulmonary hypertension - Combined pre and post capillary PH; will obtain PFTs, CT scan to assess for parenchymal lung disease. A large component of this is likely Group 2;  there is likely some fixed component of PH now but I do not believe this will be a contraindication to LVAD in the setting of preserved RV function.  3. SVT/NSVT - Continue digoxin 0.0664mg - Continue Amiodarone '200mg'$  BID 4. Hx of prom[facility]ocytic [facility]  5. Hx of endocarditis - 2015 6. Hypokalemia - 3.1 today, will replete - Mg 2.1 7. Hyperlipidemia - LDL 85, continue home statin  Discharge Weight Range: 83.7 kg Discharge Vitals: Blood pressure 123/70, pulse 88, temperature 97.6 F (36.4 C), temperature source Oral, resp. rate 13, height '5\' 9"'$  (1.753 m), weight 83.7 kg, SpO2 96 %.  Labs: Lab Results  Component Value Date   WBC 4.7 09/02/2022   HGB 14.0 09/02/2022   HCT 39.8 09/02/2022   MCV 87.3 09/02/2022   PLT 192 09/02/2022    Recent Labs  Lab 09/02/22 0547  NA 139  K 3.1*  CL 105  CO2 24  BUN 19  CREATININE 1.11  CALCIUM 8.7*  PROT 6.6  BILITOT 1.1  ALKPHOS 58  ALT 24  AST 16  GLUCOSE 122*   Lab Results  Component Value Date   CHOL 134 09/02/2022   HDL 34 (L) 09/02/2022   LDLCALC 85 09/02/2022   TRIG 75 09/02/2022   BNP (last 3 results) Recent Labs    08/01/22 1526 08/19/22 1256 09/01/22 1817  BNP 2,022.0* 1,916.6* 2,022.4*    ProBNP (last 3 results) No results for input(s): "PROBNP" in the last 8760 hours.   Diagnostic Studies/Procedures   VAS [place]OWER EXTREMITY VENOUS (DVT)  Result Date: 09/02/2022  Lower Venous DVT Study Patient Name:  Ricardo Zuniga  Date of Exam:   09/02/2022 Medical Rec #: [id number]         Accession #:    [id number] Date of Birth: February 25, 1951         Patient Gender: M Patient Age:   36 years Exam Location:  [facility] Procedure:      VAS [place] LOWER EXTREMITY VENOUS (DVT) Referring Phys: Hebert Soho --------------------------------------------------------------------------------  Indications: Pre-VAD.  Risk Factors: None identified. Comparison Study: No prior studies. Performing  Technologist: Oliver Hum RVT  Examination Guidelines: A complete evaluation includes B-mode imaging, spectral Doppler, color Doppler, and power Doppler as needed of all accessible portions of each vessel. Bilateral testing is considered an integral part of a complete examination. Limited examinations for reoccurring indications may be performed as noted. The reflux portion of the exam is performed with the patient in reverse Trendelenburg.  +---------+---------------+---------+-----------+----------+--------------+ RIGHT    CompressibilityPhasicitySpontaneityPropertiesThrombus Aging +---------+---------------+---------+-----------+----------+--------------+ CFV      Full           Yes      Yes                                 +---------+---------------+---------+-----------+----------+--------------+ SFJ      Full                                                        +---------+---------------+---------+-----------+----------+--------------+ FV Prox  Full                                                        +---------+---------------+---------+-----------+----------+--------------+ FV Mid   Full                                                        +---------+---------------+---------+-----------+----------+--------------+ FV DistalFull                                                        +---------+---------------+---------+-----------+----------+--------------+ PFV      Full                                                        +---------+---------------+---------+-----------+----------+--------------+ POP      Full           Yes      Yes                                 +---------+---------------+---------+-----------+----------+--------------+ PTV      Full                                                        +---------+---------------+---------+-----------+----------+--------------+  PERO     Full                                                         +---------+---------------+---------+-----------+----------+--------------+   +---------+---------------+---------+-----------+----------+--------------+ LEFT     CompressibilityPhasicitySpontaneityPropertiesThrombus Aging +---------+---------------+---------+-----------+----------+--------------+ CFV      Full           Yes      Yes                                 +---------+---------------+---------+-----------+----------+--------------+ SFJ      Full                                                        +---------+---------------+---------+-----------+----------+--------------+ FV Prox  Full                                                        +---------+---------------+---------+-----------+----------+--------------+ FV Mid   Full                                                        +---------+---------------+---------+-----------+----------+--------------+ FV DistalFull                                                        +---------+---------------+---------+-----------+----------+--------------+ PFV      Full                                                        +---------+---------------+---------+-----------+----------+--------------+ POP      Full           Yes      Yes                                 +---------+---------------+---------+-----------+----------+--------------+ PTV      Full                                                        +---------+---------------+---------+-----------+----------+--------------+ PERO     Full                                                        +---------+---------------+---------+-----------+----------+--------------+  Summary: RIGHT: - There is no evidence of deep vein thrombosis in the lower extremity.  - No cystic structure found in the popliteal fossa.  LEFT: - There is no evidence of deep vein thrombosis in the lower extremity.  - No cystic structure found in  the popliteal fossa.  *See table(s) above for measurements and observations. Electronically signed by Deitra Mayo MD on 09/02/2022 at 4:23:59 PM.    Final    VAS US DOPPLER PRE VAD  Result Date: 09/02/2022 PERIOPERATIVE VASCULAR EVALUATION Patient Name:  DERON POOLE  Date of Exam:   09/02/2022 Medical Rec #: [id number]         Accession #:    [id number] Date of Birth: [birth year]         Patient Gender: M Patient Age:   28 years Exam Location:  [facility] Procedure:      VAS US DOPPLER PRE VAD Referring Phys: Hebert Soho --------------------------------------------------------------------------------  Indications:      Pre-VAD. Risk Factors:     Hypertension, hyperlipidemia, Diabetes. Comparison Study: No prior studies. Performing Technologist: Carlos Levering RVT  Examination Guidelines: A complete evaluation includes B-mode imaging, spectral Doppler, color Doppler, and power Doppler as needed of all accessible portions of each vessel. Bilateral testing is considered an integral part of a complete examination. Limited examinations for reoccurring indications may be performed as noted.  Right Carotid Findings: +----------+--------+--------+--------+-----------------------+--------+           PSV cm/sEDV cm/sStenosisDescribe               Comments +----------+--------+--------+--------+-----------------------+--------+ CCA Prox  69      10              smooth and heterogenous         +----------+--------+--------+--------+-----------------------+--------+ CCA Distal56      11              smooth and heterogenous         +----------+--------+--------+--------+-----------------------+--------+ ICA Prox  53      13              smooth and heterogenous         +----------+--------+--------+--------+-----------------------+--------+ ICA Distal76      20                                     tortuous  +----------+--------+--------+--------+-----------------------+--------+ ECA       128                                                     +----------+--------+--------+--------+-----------------------+--------+ +----------+--------+-------+--------+------------+           PSV cm/sEDV cmsDescribeArm Pressure +----------+--------+-------+--------+------------+ Subclavian119                    125          +----------+--------+-------+--------+------------+ +---------+--------+--+--------+-+---------+ VertebralPSV cm/s71EDV cm/s7Antegrade +---------+--------+--+--------+-+---------+ Left Carotid Findings: +----------+--------+--------+--------+-----------------------+--------+           PSV cm/sEDV cm/sStenosisDescribe               Comments +----------+--------+--------+--------+-----------------------+--------+ CCA Prox  96      18              smooth and heterogenous         +----------+--------+--------+--------+-----------------------+--------+  CCA Distal81      11              smooth and heterogenous         +----------+--------+--------+--------+-----------------------+--------+ ICA Prox  71      11              smooth and heterogenoustortuous +----------+--------+--------+--------+-----------------------+--------+ ICA Distal100     20                                     tortuous +----------+--------+--------+--------+-----------------------+--------+ ECA       99      8                                               +----------+--------+--------+--------+-----------------------+--------+ +----------+--------+--------+--------+------------+ SubclavianPSV cm/sEDV cm/sDescribeArm Pressure +----------+--------+--------+--------+------------+           86                      127          +----------+--------+--------+--------+------------+ +---------+--------+--+--------+--+---------+ VertebralPSV cm/s63EDV cm/s19Antegrade  +---------+--------+--+--------+--+---------+  ABI Findings: +--------+------------------+-----+-----------+--------+ Right   Rt Pressure (mmHg)IndexWaveform   Comment  +--------+------------------+-----+-----------+--------+ [id number]                    triphasic           +--------+------------------+-----+-----------+--------+ PTA     150               1.18 multiphasic         +--------+------------------+-----+-----------+--------+ DP      144               1.13 multiphasic         +--------+------------------+-----+-----------+--------+ +--------+------------------+-----+-----------+-------+ Left    Lt Pressure (mmHg)IndexWaveform   Comment +--------+------------------+-----+-----------+-------+ [id number]                    triphasic          +--------+------------------+-----+-----------+-------+ PTA     149               1.17 multiphasic        +--------+------------------+-----+-----------+-------+ DP      150               1.18 multiphasic        +--------+------------------+-----+-----------+-------+ +-------+---------------+----------------+ ABI/TBIToday's ABI/TBIPrevious ABI/TBI +-------+---------------+----------------+ Right  1.18                            +-------+---------------+----------------+ Left   1.18                            +-------+---------------+----------------+  Summary: Right Carotid: Velocities in the right ICA are consistent with a 1-39% stenosis. Left Carotid: Velocities in the left ICA are consistent with a 1-39% stenosis. Vertebrals: Bilateral vertebral arteries demonstrate antegrade flow.  *See table(s) above for measurements and observations. Right ABI: Resting right ankle-brachial index is within normal range. Left ABI: Resting left ankle-brachial index is within normal range.  Electronically signed by Deitra Mayo MD on 09/02/2022 at 4:20:37 PM.    Final    CT CHEST ABDOMEN PELVIS W CONTRAST  Result  Date: 09/02/2022 CLINICAL DATA:  LVAD  evaluation EXAM: CT CHEST, ABDOMEN, AND PELVIS WITH CONTRAST TECHNIQUE: Multidetector CT imaging of the chest, abdomen and pelvis was performed following the standard protocol during bolus administration of intravenous contrast. RADIATION DOSE REDUCTION: This exam was performed according to the departmental dose-optimization program which includes automated exposure control, adjustment of the mA and/or kV according to patient size and/or use of iterative reconstruction technique. CONTRAST:  18m OMNIPAQUE IOHEXOL 350 MG/ML SOLN COMPARISON:  CT abdomen and pelvis dated May 11, 2019 FINDINGS: CT CHEST FINDINGS Cardiovascular: Cardiomegaly. Trace pericardial effusion. Normal caliber thoracic aorta with moderate atherosclerotic disease. Moderate three-vessel coronary artery calcifications. Mediastinum/Nodes: Esophagus and thyroid are unremarkable. No pathologically enlarged nodes seen in the chest. Lungs/Pleura: Central airways are patent. Mild septal thickening. Trace right-greater-than-left bilateral pleural effusions and bibasilar atelectasis. New irregular solid pulmonary nodule of the posterior left upper lobe measuring 6 mm on series 4, image 52. Musculoskeletal: No chest wall mass or suspicious bone lesions identified. CT ABDOMEN PELVIS FINDINGS Hepatobiliary: No focal liver abnormality is seen. Status post cholecystectomy. No biliary dilatation. Pancreas: Unremarkable. No pancreatic ductal dilatation or surrounding inflammatory changes. Spleen: Normal in size without focal abnormality. Adrenals/Urinary Tract: Bilateral adrenal glands are unremarkable. No hydronephrosis. Bilateral nonobstructing renal stones. No suspicious renal lesions. bladder is unremarkable. Stomach/Bowel: Stomach is within normal limits. Diverticulosis. No evidence of bowel wall thickening, distention, or inflammatory changes. Vascular/Lymphatic: Aortic atherosclerosis.  Trace pelvic ascites.  Reproductive: Prostatomegaly. Other: No abdominal wall hernia or abnormality. No abdominopelvic ascites. Musculoskeletal: No acute or significant osseous findings. IMPRESSION: 1. Cardiomegaly and mild pulmonary edema. 2. New irregular solid pulmonary nodule of the posterior left upper lobe measuring 6 mm. Non-contrast chest CT at 6-12 months is recommended. If the nodule is stable at time of repeat CT, then future CT at 18-24 months (from today's scan) is considered optional for low-risk patients, but is recommended for high-risk patients. This recommendation follows the consensus statement: Guidelines for Management of Incidental Pulmonary Nodules Detected on CT Images: From the Fleischner Society 2017; Radiology 2017; 284:228-243. 3. No acute findings in the abdomen or pelvis. 4. Aortic Atherosclerosis (ICD10-I70.0). Electronically Signed   By: LYetta GlassmanM.D.   On: 09/02/2022 08:36   DG Orthopantogram  Result Date: 09/01/2022 CLINICAL DATA:  Preoperative evaluation EXAM: ORTHOPANTOGRAM/PANORAMIC COMPARISON:  None Available. FINDINGS: Scattered dental fillings. Small dental caries noted at RIGHT third maxillary molar. No periodontal lucencies identified. IMPRESSION: Small dental caries at RIGHT third maxillary molar. No periodontal disease identified. Electronically Signed   By: MLavonia DanaM.D.   On: 09/01/2022 19:40   CARDIAC CATHETERIZATION  Result Date: 09/01/2022 HEMODYNAMICS: RA:   10 mmHg (mean) RV:   65/10-15 mmHg PA:   69/28 mmHg (43 mean) PCWP:  20 mmHg (mean)    Estimated Fick CO/CI   1.85 L/min, 3.7 L/min/m2    TPG    23  mmHg     PVR     6.22 Wood Units PAPi      4.1  IMPRESSION: Elevated pre and post capillary filling pressures Elevated PA mean and PVR secondary to combined pre and post capillary pulmonary hypertension. Severely reduced cardiac index. Hemodynamic parameters suggestive of preserved RV function. Frequent PVCs and ectopy during case. RECOMMENDATIONS: Admit for expedited  LVAD evaluation. Aditya Sabharwal Advanced Heart Failure 5:39 PM   Discharge Medications   Allergies as of 09/02/2022       Reactions   Lisinopril Cough        Medication List     STOP  taking these medications    metoprolol succinate 25 MG 24 hr tablet Commonly known as: TOPROL-XL       TAKE these medications    acetaminophen 650 MG CR tablet Commonly known as: TYLENOL Take 650-1,300 mg by mouth every 8 (eight) hours as needed for pain.   albuterol 108 (90 Base) MCG/ACT inhaler Commonly known as: VENTOLIN HFA Inhale 2 puffs into the lungs every 6 (six) hours as needed for wheezing or shortness of breath.   ALPRAZolam 0.25 MG tablet Commonly known as: XANAX Take 0.25 mg by mouth at bedtime as needed for anxiety or sleep.   amiodarone 200 MG tablet Commonly known as: PACERONE Take 1 tablet (200 mg total) by mouth 2 (two) times daily.   aspirin EC 81 MG tablet Take 81 mg by mouth in the morning.   atorvastatin 40 MG tablet Commonly known as: LIPITOR Take 1 tablet (40 mg total) by mouth daily.   diazepam 5 MG tablet Commonly known as: VALIUM Take 5 mg by mouth daily as needed (vertigo).   Digoxin 62.5 MCG Tabs Take 1 tablet by mouth daily. Start taking on: September 03, 2022 What changed:  medication strength how much to take   Entresto 24-26 MG Generic drug: sacubitril-valsartan Take 1 tablet by mouth twice daily   furosemide 40 MG tablet Commonly known as: LASIX Take 40 mg by mouth 2 (two) times daily.   guaiFENesin 600 MG 12 hr tablet Commonly known as: MUCINEX Take 600 mg by mouth daily as needed (congestion.).   Jardiance 10 MG Tabs tablet Generic drug: empagliflozin Take 10 mg by mouth every evening.   multivitamin with minerals Tabs tablet Take 1 tablet by mouth daily. Start taking on: September 03, 2022   omeprazole 20 MG capsule Commonly known as: PRILOSEC Take 20 mg by mouth daily before breakfast.   ondansetron 4 MG tablet Commonly  known as: ZOFRAN Take 1 tablet (4 mg total) by mouth every 6 (six) hours as needed for nausea.   repaglinide 2 MG tablet Commonly known as: PRANDIN Take 2 mg by mouth 3 (three) times daily before meals.   spironolactone 25 MG tablet Commonly known as: ALDACTONE Take 0.5 tablets (12.5 mg total) by mouth daily.   traZODone 50 MG tablet Commonly known as: DESYREL Take 1 tablet (50 mg total) by mouth at bedtime as needed for sleep.   Vitamin D3 125 MCG (5000 UT) Tabs Take 1 tablet by mouth in the morning.   zinc gluconate 50 MG tablet Take 50 mg by mouth in the morning.        Disposition   The patient will be discharged in stable condition to home. Discharge Instructions     (HEART FAILURE PATIENTS) Call MD:  Anytime you have any of the following symptoms: 1) 3 pound weight gain in 24 hours or 5 pounds in 1 week 2) shortness of breath, with or without a dry hacking cough 3) swelling in the hands, feet or stomach 4) if you have to sleep on extra pillows at night in order to breathe.   Complete by: As directed    Diet - low sodium heart healthy   Complete by: As directed    Diet - low sodium heart healthy   Complete by: As directed    Increase activity slowly   Complete by: As directed    Increase activity slowly   Complete by: As directed           Duration of Discharge  Encounter: Greater than 35 minutes   Signed, Earnie Larsson AGACNP-BC  09/02/2022, 4:58 PM

## 2022-09-02 NOTE — Progress Notes (Signed)
Initial Nutrition Assessment  DOCUMENTATION CODES:   Not applicable  INTERVENTION:   - Liberalize diet to 2 gram sodium to promote PO intake  - MVI with minerals daily  - RD to monitor PO intake and order oral nutrition supplements as needed  NUTRITION DIAGNOSIS:   Increased nutrient needs related to chronic illness (HFrEF) as evidenced by estimated needs.  GOAL:   Patient will meet greater than or equal to 90% of their needs  MONITOR:   PO intake, Supplement acceptance, Labs, Weight trends, I & O's  REASON FOR ASSESSMENT:   Consult LVAD Eval  ASSESSMENT:   72 year old male who presented on 1/22 after RHC that demonstrated cardiac index of 1.85. Pt admitted for expedited LVAD evaluation. PMH of HFrEF 2/2 nonischemic cardiomyopathy, T2DM, acute promyelocytic leukemia s/p ATRA ([facility] 2014), CAD, GERD, PUD.  01/22 - RHC  Spoke with pt's wife at bedside. Pt out of room for testing and did not return during RD visit (30 minute timeframe). RD will re-attempt to see pt tomorrow prior to discharge as schedule allows.  Pt's wife shares that pt has a good appetite at baseline. She reports that he enjoys protein-rich foods like eggs, sausage, bacon, and steak. Pt limits his carbohydrate intake (especially bread) to keep his blood sugars under control. For example, pt will eat a biscuit sandwich with only half of the biscuit or a burger with only half of the bun. Pt typically eats 3 meals daily. Breakfast is his main meal of the day and his favorite. For breakfast, he usually eats eggs, sausage, and bacon. For lunch and dinner, pt may have steak or spaghetti (occasionally) or pizza (occasionally). Pt's wife shares that pt does not like the food here at the hospital. Pt's wife has been bringing pt food from the cafeteria.  Pt's wife reports that pt's weight has been fairly stable between 181-186 lbs if he does not have extra fluid on him. She reports that several years ago, pt weighed 220  lbs but went on a very strict diet (cut out all bread, drastically decreased sodium intake) and lost about 40 lbs. Pt has eased up on the strictness of his diet over the last 6-7 months.  Reviewed weight history in chart. Pt's weight has fluctuated between 80-87 kg over the last year. No real trends noted. Overall, weight is down compared to weight this time last year. Unsure if this is true dry weight loss given fluctuations related to volume status.  Discussed importance of protein intake and blood sugar control in post-op healing. Pt has consumed Ensure supplements in the past and enjoys these. Discussed possibility of liberalizing diet to provide maximum food choices. Noted plan for pt to d/c after expedited LVAD work-up with tentative plan for LVAD placement on 09/24/22.  Admit weight: 84.4 kg Current weight: 83.7 kg  Meal Completion: 75% x 1 documented meal  Medications reviewed and include: farxiga, SSI, klor-con 40 mEq x 2, spironolactone  Labs reviewed: potassium 3.1, HDL 34, INR 1.3, hemoglobin A1C 7.4 CBG's: 143-213 x 24 hours  UOP: 2025 ml x 12 hours I/O's: -1.0 L since admit  NUTRITION - FOCUSED PHYSICAL EXAM:  Unable to complete at this time. Pt out of room for testing.  Diet Order:   Diet Order             Diet 2 gram sodium Room service appropriate? Yes with Assist; Fluid consistency: Thin  Diet effective now  Diet - low sodium heart healthy                   EDUCATION NEEDS:   Education needs have been addressed  Skin:  Skin Assessment: Reviewed RN Assessment  Last BM:  09/01/22  Height:   Ht Readings from Last 1 Encounters:  09/01/22 '5\' 9"'$  (1.753 m)    Weight:   Wt Readings from Last 1 Encounters:  09/02/22 83.7 kg    BMI:  Body mass index is 27.25 kg/m.  Estimated Nutritional Needs:   Kcal:  2200-2400  Protein:  115-130 grams  Fluid:  2.0 L    Gustavus Bryant, MS, RD, LDN Inpatient Clinical Dietitian Please see AMiON for  contact information.

## 2022-09-02 NOTE — Progress Notes (Signed)
  Echocardiogram 2D Echocardiogram has been performed.  Ricardo Zuniga 09/02/2022, 11:52 AM

## 2022-09-02 NOTE — Progress Notes (Signed)
Patient seen and discussed with AHF.   Good candidate for LVAD  Echo reviewed at bedside - LVIDD 6cm, mild-mod AI.  May need park stitch.  Plan Feb 14th   Full consult pending  Justice Rocher MD  CV Surgery

## 2022-09-03 LAB — PSA, TOTAL AND FREE
PSA, Free Pct: 100 %
PSA, Free: 0.2 ng/mL
Prostate Specific Ag, Serum: 0.2 ng/mL (ref 0.0–4.0)

## 2022-09-03 LAB — LUPUS ANTICOAGULANT PANEL
DRVVT: 26.1 s (ref 0.0–47.0)
PTT Lupus Anticoagulant: 32.2 s (ref 0.0–43.5)

## 2022-09-03 NOTE — Progress Notes (Signed)
LVAD Initial Psychosocial Screening  Date/Time Initiated:  09-02-22 11:15 am Referral Source:  Tanda Rockers, LVAD Coordinator Referral Reason:  LVAD Implantation Source of Information:  Pt, wife and chart review  Demographics Name:  Thurlow Gallaga Address:  [address] [place] [address] Cell: [phone] Marital Status:  Married  Faith:  [employer] Primary Language:  English DOB:  [birth year]  Medical & Follow-up Adherence to Medical regimen/INR checks: compliant  Medication adherence: compliant  Physician/Clinic Appointment Attendance: compliant   Advance Directives: Do you have a Living Will or Medical POA? No  Would you like to complete a Living Will and Medical POA prior to surgery?  Yes Currently working with their lawyer Do you have Goals of Care? Yes  Have you had a consult with the Palliative Care Team at [facility]? No  Psychological Health Appearance:  In hospital gown Mental Status:  Alert, oriented Eye Contact:  Good Thought Content:  Coherent Speech:  Logical/coherent Mood:  Appropriate and Positive  Affect:  Appropriate to circumstance Insight:  Good Judgement: Unimpaired Interaction Style:  Engaged  Family/Social Information Who lives in your home? Name:   Relationship:   Paris Lore  [phone]  Other family members/support persons in your life? Name:   Relationship:   Malvin Johns and Corene Cornea DIL/Son [place] Son   [phone]  Caregiving Needs Who is the primary caregiver? [name]  Wife  Health status:  good Do you drive?  yes Do you work?  no Physical Limitations:  none Do you have other care giving responsibilities?  no  Contact number: [phone]  Who is the secondary caregiver? Corene Cornea and [place] status:  good Do you drive?  yes Do you work?  yes Physical Limitations:  none Do you have other care giving responsibilities?  Minor children Contact number: [phone]  Home Environment/Personal Care Do you  have reliable phone service? Yes  If so, what is the number?  [phone] Do you own or rent your home? own Number of steps into the home? 1 step How many levels in the home? 2 levels (1st floor bedroom) Assistive devices in the home? none Electrical needs for LVAD (3 prong outlets)? yes Second hand smoke exposure in the home? no Travel distance from [place]? 35 minutes   Community Are you active with community agencies/resources/homecare? No  Are you active in a church, synagogue, mosque or other faith based community? Yes Faith based institutions name:  [place] other sources do you have for spiritual support? Friends and family Are you active in any clubs or social organizations? Social research officer, government of [place] What do you do for fun?  Hobbies?  Interests? Spending time with my grandchildren  Education/Work Information What is the last grade of school you completed? Associates Degree and multiple law enforcement certificates Preferred method of learning?  Written, Verbal, and Hands on Do you have any problems with reading or writing?  No Are you currently employed?  No  When were you last employed? 2014  Name of employer? 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 No Are you interested in job training or learning new skills?  No Did you serve in the [employer]? 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  [employer], Police and Teachers Retirement plans Do you have difficulty meeting your monthly expenses? No Can you budget for the monthly cost for dressing supplies post procedure? Yes  Primary Health insurance:  Hum[employer] Medicare Secondary Insurance: n/a What are your prescription co-pays? varies Do you use mail order for your  prescriptions?  No Have you ever had to refuse medication due to cost?  No Have you applied for Medicaid?  no Have you applied for Social Security Disability (SSI)   no  Medical Information Briefly describe why you are here for evaluation:Diagnosed with Lukemia in 2015 and found to have a reduced EF post treatment. Do you have a PCP or other medical provider? Glenda Chroman, MD Are you able to complete your ADL's? yes Do you have a history of trauma, physical, emotional, or sexual abuse? no Do you have any family history of heart problems? Father Do you smoke now or past usage? past usage    Quit date: 02-28-1986 Do you drink alcohol now or past usage? past usage    Quit date: September, 1981  Are you currently using illegal drugs or misuse of medication or past usage? never  Have you ever been treated for substance abuse? No    Mental Health History How have you been feeling in the past year? Up and down- 2023 was a rough year Have you ever had any problems with depression, anxiety or other mental health issues? no Do you see a counselor, psychiatrist or therapist?  no If you are currently experiencing problems are you interested in talking with a professional? No Have you or are you taking medications for anxiety/depression or any mental health concerns?  Yes  Current Medications: Xanax for sleep What are your coping strategies under stressful situations?Talking with wife and praying Are there any other stressors in your life? no Have you had any past or current thoughts of suicide? no How many hours do you sleep at night? 6-7 hours How is your appetite? good Would you be interested in attending the LVAD support group? yes  PHQ2 Depression Scale: 0  Legal Do you currently have any legal issues/problems?  None Have you had any legal issues/problems in the past?  none   Plan for VAD Implementation Do you know and understand what happens during the VAD surgery? Patient Verbalizes  Understanding  of surgery and able to describe details What do you know about the risks and side effect associated with VAD surgery? Patient Verbalizes Understanding  of risks (infection, stroke and death) Explain what will happen right after surgery: Patient Verbalizes Understanding  of OR to ICU and will be intubated What is your plan for transportation for the first 8 weeks post-surgery? (Patients are not recommended to drive post-surgery for 8 weeks)  Driver:    wife Do you have airbags in your vehicle?  There is a risk of discharging the device if the airbag were to deploy. What do you know about your diet post-surgery? Patient Verbalizes Understanding  of Heart healthy How do you plan to monitor your medications, current and future?   Self manages How do you plan to complete ADL's post-surgery? Will it be difficult to ask for help from your caregivers?  Wife will assist as needed  Please explain what you hope will be improved about your life as a result of receiving the LVAD? Doing things with my family and improved quality of life Please tell me your biggest concern or fear about living with the LVAD?  Getting constipated Please explain your understanding of how their body will change.  Are you worried about these changes? Patient states I will at first but will adjust Do you see any barriers to your surgery or follow-up? Denies any barriers  Understanding of LVAD Patient states understanding of the following: Surgical procedures and risks, Electrical need for LVAD (3 prong outlets), Safety precautions with LVAD (water, etc.), LVAD daily self-care (dressing changes, computer check, extra supplies), Outpatient follow up (LVAD clinic appts, monitoring blood thinners), and Need for Emergency Planning  Discussed and Reviewed with Patient and Caregiver  Patient's current level of motivation to prepare for LVAD: Highly Motivated Patient's present Level of Consent for LVAD: Ready      Education provided to patient/family/caregiver:   Caregiver role and responsibiltiy, Financial planning for LVAD, Role of Clinical Social Worker, and Signs of Depression and Anxiety    Caregiver questions Please explain what you hope will be improved about your life and loved one's life as a result of receiving the LVAD?  To be able to live life like he did before he got sick. What is your biggest concern or fear about caregiving with an LVAD patient?  "What I don't know" What is your plan for availability to provide care 24/7 x2 weeks post op and dressing changes ongoing?  Wife is home and shared that she is a retired Therapist, sports Who is the relief/backup caregiver and what is their availability?  Son and DIL live minutes away will assist as needed. Preferred method of learning? Written, Verbal, and Hands on  Do you drive? yes How do you handle stressful situations? I am very stoic and will do my thing when I find time alone  Do you think you can do this? yes Is there anything that concerns about caregiving?   no Do you provide caregiving to anyone else?  no   Caregiver's current level of motivation to prepare for LVAD:  very motivated Caregiver's present level of consent for LVAD: ready  Clinical Interventions Needed:    CSW will monitor signs and symptoms of depression and assist with adjustment to life with an LVAD.  CSW will refer patient for HPOA, advanced directives if not completed prior to surgery if still wishing to complete.  CSW encouraged attendance with the LVAD Support Group to assist further with adjustment and post implant peer support.  Clinical Impressions/Recommendations:   Patient is a 72 yo male who is married for 17 years and has 2 adult sons. 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 He is  working with a Chief Executive Officer on obtaining his Advance Directive documents and hopes to have prior to surgery. Patient has a good support sytem with his wife who is a retired Therapist, sports and his Son and DIL who is also an Therapist, sports. He is very active with his church community, member of the executive board for the [employer] and enjoys spending time with his grandchildren. He denies any current use of tobacco, alcohol both he quit many years ago. He never used any illegal substances. He denies any concerns with mental health and scored a 0 on the PHQ2. He does report that he has a prescription for Xanax to help with sleeping. He hopes to have improved quality of life and able to do more with his family as his goal for LVAD  implantation. Patient appears to have a good support system and is a great candidate for LVAD implantation. CSW would recommend patient for implant.   Melba Coon, [place], [place]

## 2022-09-04 LAB — HEPATITIS B SURFACE ANTIBODY, QUANTITATIVE: Hep B S AB Quant (Post): <3.1 m[IU]/mL — ABNORMAL LOW (ref >9.9)

## 2022-09-05 ENCOUNTER — Telehealth (HOSPITAL_COMMUNITY): Payer: Self-pay | Admitting: Unknown Physician Specialty

## 2022-09-05 LAB — TYPE AND SCREEN
ABO/RH(D): A POS
Antibody Screen: POSITIVE
Donor AG Type: NEGATIVE
Donor AG Type: NEGATIVE
PT AG Type: NEGATIVE
Unit division: 0
Unit division: 0

## 2022-09-05 LAB — BPAM RBC
Blood Product Expiration Date: 202402172359
Blood Product Expiration Date: 202402232359
ISSUE DATE / TIME: 202401202128
Unit Type and Rh: 5100
Unit Type and Rh: 5100

## 2022-09-05 NOTE — Telephone Encounter (Signed)
Called pt to discuss VAD evaluation results and process. We were able to complete his evaluation process this week. Pt informed that we are cancelling his CPX per DR Sabharwal as we no longer need this for VAD evaluation. Pt informed that we will discuss his case on 2/5 at our [facility] and call him after the meeting with the plan to move forward. Pt states that he feels good about everything and is ready to proceed with VAD implant.   Tanda Rockers RN, BSN VAD Coordinator 24/7 Pager [phone]

## 2022-09-08 ENCOUNTER — Other Ambulatory Visit (HOSPITAL_COMMUNITY): Payer: Self-pay

## 2022-09-08 NOTE — Progress Notes (Addendum)
Ricardo Zuniga, male    DOB: [birth year]    MRN: [id number]   Brief patient profile:  67   yowm  quit smoking 1987 s sequelae  referred to pulmonary clinic in [place]  09/09/2022 by Dr McDowell's office for doe anticipating LVAD p acute pna Oct 2023   History of Present Illness  09/09/2022  Pulmonary/ 1st office eval/ Ricardo Zuniga / [facility]  Chief Complaint  Patient presents with   Consult    SOB ref by cardiology. Having LVAD procedure on 09/24/2022   Dyspnea:  parking lot into store doe then able [employer] pushing  Cough: episodic x Oct 2023 and assoc w seasonal allergy  Sleep: 20 degrees electric bed s resp cc  SABA use: not now  02: none   No obvious day to day or daytime pattern/variability or assoc excess/ purulent sputum or mucus plugs or hemoptysis or cp or chest tightness, subjective wheeze or overt sinus or hb symptoms.   Sleeping  without nocturnal  or early am exacerbation  of respiratory  c/o's or need for noct saba. Also denies any obvious fluctuation of symptoms with weather or environmental changes or other aggravating or alleviating factors except as outlined above   No unusual exposure hx or h/o childhood pna/ asthma or knowledge of premature birth.  Current Allergies, Complete Past Medical History, Past Surgical History, Family History, and Social History were reviewed in [employer] record.  ROS  The following are not active complaints unless bolded Hoarseness, sore throat, dysphagia, dental problems, itching, sneezing,  nasal congestion or discharge of excess mucus or purulent secretions, ear ache,   fever, chills, sweats, unintended wt loss or wt gain, classically pleuritic or exertional cp,  orthopnea pnd or arm/hand swelling  or leg swelling, presyncope, palpitations, abdominal pain, anorexia, nausea, vomiting, diarrhea  or change in bowel habits or change in bladder habits, change in stools or change in urine, dysuria, hematuria,  rash,  arthralgias, visual complaints, headache, numbness, weakness or ataxia or problems with walking or coordination,  change in mood or  memory.             Past Medical History:  Diagnosis Date   Anal fissure    APML (acute promyelocytic leukemia) in remission ([place])    Completed treatment 04/2013   CHF (congestive heart failure) ([facility])    Coronary artery disease    Nonobstructive at cardiac catheterization 09/2018   Difficult intubation    w/shoulder surgery at surgical center between 2005-2008   Gastroesophageal reflux disease    History of blood transfusion 2014   History of kidney stones    passed stones   Hypercholesteremia    Neuropathy    bilateral feet   Nonischemic cardiomyopathy ([place])    a. EF 20% in 2015 - thought to possibly be viral induced or chemotherapy induced from treatments in 2014 b. at 35-40% by echo in 05/2016    Peptic ulcer disease    Pneumonia    Yrs ago   Type 2 diabetes mellitus ([place])    Ureteral colic    Vertigo    tx with valium prn   Wears glasses    Wears hearing aid in both ears     Outpatient Medications Prior to Visit  Medication Sig Dispense Refill   acetaminophen (TYLENOL) 650 MG CR tablet Take 650-1,300 mg by mouth every 8 (eight) hours as needed for pain.     albuterol (VENTOLIN HFA) 108 (90 Base) MCG/ACT inhaler Inhale  2 puffs into the lungs every 6 (six) hours as needed for wheezing or shortness of breath. 8 g 2   ALPRAZolam (XANAX) 0.25 MG tablet Take 0.25 mg by mouth at bedtime as needed for anxiety or sleep.     amiodarone (PACERONE) 200 MG tablet Take 1 tablet (200 mg total) by mouth 2 (two) times daily. 180 tablet 3   aspirin EC 81 MG tablet Take 81 mg by mouth in the morning.     atorvastatin (LIPITOR) 40 MG tablet Take 1 tablet (40 mg total) by mouth daily. 90 tablet 1   Cholecalciferol (VITAMIN D3) 125 MCG (5000 UT) TABS Take 1 tablet by mouth in the morning.     diazepam (VALIUM) 5 MG tablet Take 5 mg by mouth daily as needed  (vertigo).     Digoxin 62.5 MCG TABS Take 1 tablet by mouth daily. 30 tablet 5   empagliflozin (JARDIANCE) 10 MG TABS tablet Take 10 mg by mouth every evening.     furosemide (LASIX) 40 MG tablet Take 40 mg by mouth 2 (two) times daily.     guaiFENesin (MUCINEX) 600 MG 12 hr tablet Take 600 mg by mouth daily as needed (congestion.).     Multiple Vitamin (MULTIVITAMIN WITH MINERALS) TABS tablet Take 1 tablet by mouth daily. 30 tablet 5   omeprazole (PRILOSEC) 20 MG capsule Take 20 mg by mouth daily before breakfast.     ondansetron (ZOFRAN) 4 MG tablet Take 1 tablet (4 mg total) by mouth every 6 (six) hours as needed for nausea. 20 tablet 0   repaglinide (PRANDIN) 2 MG tablet Take 2 mg by mouth 3 (three) times daily before meals.      sacubitril-valsartan (ENTRESTO) 24-26 MG Take 1 tablet by mouth twice daily 180 tablet 1   spironolactone (ALDACTONE) 25 MG tablet Take 0.5 tablets (12.5 mg total) by mouth daily. 45 tablet 3   traZODone (DESYREL) 50 MG tablet Take 1 tablet (50 mg total) by mouth at bedtime as needed for sleep. 30 tablet 1   zinc gluconate 50 MG tablet Take 50 mg by mouth in the morning.     No facility-administered medications prior to visit.     Objective:     BP 136/78 (BP Location: Left Arm, Patient Position: Sitting)   Pulse 66   Ht 5' 9.5" (1.765 m)   Wt 191 lb (86.6 kg)   SpO2 97% Comment: ra  BMI 27.80 kg/m   SpO2: 97 % (ra)  Pleasantly verbose amb elderly wm nad    HEENT : Oropharynx  clear  /  Nasal turbinates L > R swelling/ non-specific    NECK :  without  apparent JVD/ palpable Nodes/TM    LUNGS: no acc muscle use,  Nl contour chest which is clear to A and P bilaterally without cough on insp or exp maneuvers   CV:  RRR  no s3 or murmur or increase in P2, and 1+ ptting lower ext sym   ABD:  soft and nontender with nl inspiratory excursion in the supine position. No bruits or organomegaly appreciated   MS:  Nl gait/ ext warm without deformities Or  obvious joint restrictions  calf tenderness, cyanosis or clubbing    SKIN: warm and dry without lesions    NEURO:  alert, approp, nl sensorium with  no motor or cerebellar deficits apparent.       I personally reviewed images and agree with radiology impression as follows:  Chest CT chest w  contrast 09/02/22 1. Cardiomegaly and mild pulmonary edema. 2. New irregular solid pulmonary nodule of the posterior left upper lobe measuring 6 mm. Non-contrast chest CT at 6-12 months is recommended. If the nodule is stable at time of repeat CT, then future CT at 18-24 months (from today's scan) is considered optional for low-risk patients.     Assessment   DOE (dyspnea on exertion) Quit smoking 1987 s apparent sequelae - PFT's  09/02/22  FEV1 2.28 (74 % ) ratio 0.89  p 5 % improvement from saba p 0 prior to study with DLCO  15.6 (62%)   and FV curve nl  and ERV nl at hgb =14   - 09/09/2022   Walked on RA  x  3  lap(s) =  approx 450  ft  @ moderate pace, stopped due to end of study with lowest 02 sats 93% talking entire time very mild sob all 3   Imp: no significant copd/ AB or ILD:  Comment: When respiratory symptoms begin or become refractory well after a patient reports complete smoking cessation,  especially when this wasn't the case while they were smoking, a red flag is raised based on the work of Dr Kris Mouton which states:  if you quit smoking when your best day FEV1 is still well preserved it is highly unlikely you will progress to severe disease.  That is to say, once the smoking stops,  the symptoms should not suddenly erupt or markedly worsen.  If so, the differential diagnosis should include  obesity/deconditioning,  LPR/Reflux/Aspiration syndromes,  occult CHF, or  especially side effect of medications commonly used in this population.    His coughing spells are likely pnds from rhinitis perhaps accentuated by the effects of entresto on upper airway bradykinins, but they are tolerable  for now   His interstitial changes on cxr are likely chf, esp since not dropping sats with ex, though he is at risk of amiodarone toxicity if high doses are needed for long periods Patients typically have been on amiodarone for 6-12 months before this complication manifests.  Of note, serial clinical evaluation for symptoms such as cough dyspnea or fevers is  the preferred method of monitoring for pulmonary toxicity because a decrease in DLCO or lung volumes is a nonspecific for toxicity.    Risk factors for pulmonary toxicity include age greater than 82, daily dose greater than equal to 400 mg, a high cumulative dose, or pre-existing lung disease   Rec Make sure you check your oxygen saturation at your highest level of activity(NOT after you stop)  to be sure it stays over 90% and keep track of it at least once a week, more often if breathing getting worse, and let me know if losing ground. (Collect the dots to connect the dots approach)    Pulmonary f/u can be prn     Solitary pulmonary nodule on lung CT See Ct chest 1/23 24 x 6 mm LUL nodule> place in reminder for 12 month f/u is he is low risk    Each maintenance medication was reviewed in detail including emphasizing most importantly the difference between maintenance and prns and under what circumstances the prns are to be triggered using an action plan format where appropriate.  Total time for H and P, chart review, counseling,  directly observing portions of ambulatory 02 saturation study/ and generating customized AVS unique to this office visit / same day charting = 48 min  Christinia Gully, MD 09/09/2022

## 2022-09-09 ENCOUNTER — Ambulatory Visit: Payer: Medicare PPO | Admitting: Internal Medicine

## 2022-09-09 ENCOUNTER — Encounter: Payer: Self-pay | Admitting: Internal Medicine

## 2022-09-09 VITALS — BP 136/78 | HR 66 | Ht 69.5 in | Wt 191.0 lb

## 2022-09-09 DIAGNOSIS — R911 Solitary pulmonary nodule: Secondary | ICD-10-CM

## 2022-09-09 DIAGNOSIS — R0609 Other forms of dyspnea: Secondary | ICD-10-CM

## 2022-09-09 NOTE — Patient Instructions (Addendum)
Make sure you check your oxygen saturation at your highest level of activity(NOT after you stop)  to be sure it stays over 90% and keep track of it at least once a week, more often if breathing getting worse, and let me know if losing ground. (Collect the dots to connect the dots approach)     Pulmonary follow up is as needed

## 2022-09-09 NOTE — H&P (View-Only) (Signed)
[address]       [place], [address]             [phone]                    Ricardo Zuniga  Medical Record #[id number] Date of Birth: May 25, 1951  Referring: No ref. provider found Primary Care: Glenda Chroman, MD Primary Cardiologist: [name], MD  Chief Complaint:   No chief complaint on file.   History of Present Illness:    Ricardo Zuniga 72 y.o. male referred for consideration of LVAD.  Hx of NICM that now is limited for GDMT by hypotension. Increasing fatigue over the last several weeks.   Comorbid conditions include hx of acute promyelocytic leukemia s/p ATRA ([facility] 2014)   LHC/RHC during admit w/ nonobstructive CAD & moderately reduced CI (2.1 L/min/m2). He was ultimately discharged home on low dose GDMT.    Prior to this admit, his cardiac history dates back to 2015 when he was found to have an LVEF of 25% felt to be triggered by endocarditis.      Past Medical History:  Diagnosis Date   Anal fissure    APML (acute promyelocytic leukemia) in remission ([place])    Completed treatment 04/2013   CHF (congestive heart failure) (H[facility])    Coronary artery disease    Nonobstructive at cardiac catheterization 09/2018   Difficult intubation    w/shoulder surgery at surgical center between 2005-2008   Gastroesophageal reflux disease    History of blood transfusion 2014   History of kidney stones    passed stones   Hypercholesteremia    Neuropathy    bilateral feet   Nonischemic cardiomyopathy ([place])    a. EF 20% in 2015 - thought to possibly be viral induced or chemotherapy induced from treatments in 2014 b. at 35-40% by echo in 05/2016    Peptic ulcer disease    Pneumonia    Yrs ago   Type 2 diabetes mellitus ([place])    Ureteral colic    Vertigo    tx with valium prn   Wears glasses    Wears hearing aid in both ears     Past Surgical History:  Procedure Laterality Date   ANAL FISSURE REPAIR     APPENDECTOMY     BIOPSY   08/17/2020   Procedure: BIOPSY;  Surgeon: Harvel Quale, MD;  [facility];  Service: Gastroenterology;;   Wilmon Pali RELEASE Left 10/16/2017   Procedure: LEFT CARPAL TUNNEL RELEASE;  Surgeon: Earlie Server, MD;  Location: [place];  Service: Orthopedics;  Laterality: Left;   CARPAL TUNNEL RELEASE Right 11/25/2019   Procedure: CARPAL TUNNEL RELEASE;  Surgeon: Earlie Server, MD;  Location: [facility];  Service: Orthopedics;  Laterality: Right;   CATARACT EXTRACTION, BILATERAL     CERVICAL DISC ARTHROPLASTY N/A 01/05/2018   Procedure: Artificial Cervical Disc ReplacementCervical Three-Four, Cervical Four-Five;  Surgeon: Kristeen Miss, MD;  Location: [place];  Service: Neurosurgery;  Laterality: N/A;   CERVICAL FUSION     CHOLECYSTECTOMY     COLONOSCOPY N/A 06/27/2015   Procedure: COLONOSCOPY;  Surgeon: Rogene Houston, MD;  [facility];  Service: Endoscopy;  Laterality: N/A;  730   COLONOSCOPY WITH PROPOFOL N/A 08/17/2020   Procedure: COLONOSCOPY WITH PROPOFOL;  Surgeon: Harvel Quale, MD;  [facility];  Service: Gastroenterology;  Laterality: N/A;  11:15   COLONOSCOPY WITH PROPOFOL  N/A 10/23/2021   Procedure: COLONOSCOPY WITH PROPOFOL;  Surgeon: Harvel Quale, MD;  [facility];  Service: Gastroenterology;  Laterality: N/A;  9:45am   ESOPHAGOGASTRODUODENOSCOPY (EGD) WITH PROPOFOL N/A 08/17/2020   Procedure: ESOPHAGOGASTRODUODENOSCOPY (EGD) WITH PROPOFOL;  Surgeon: Harvel Quale, MD;  [facility];  Service: Gastroenterology;  Laterality: N/A;   KNEE SURGERY     x3   LEFT AND RIGHT HEART CATHETERIZATION WITH CORONARY ANGIOGRAM N/A 12/12/2013   Procedure: LEFT AND RIGHT HEART CATHETERIZATION WITH CORONARY ANGIOGRAM;  Surgeon: [name], MD;  Location: [facility];  Service: Cardiovascular;  Laterality: N/A;   LEFT HEART CATH AND CORONARY ANGIOGRAPHY N/A 09/17/2018   Procedure: LEFT HEART CATH AND  CORONARY ANGIOGRAPHY;  Surgeon: Wellington Hampshire, MD;  Location: [place] [facility];  Service: Cardiovascular;  Laterality: N/A;   LUMBAR FUSION     LUMBAR LAMINECTOMY/DECOMPRESSION MICRODISCECTOMY Bilateral 01/02/2022   Procedure: Bilateral Lumbar Three-Four/Lumbar Four-Five Sublaminar decompression;  Surgeon: Kristeen Miss, MD;  Location: [place];  Service: Neurosurgery;  Laterality: Bilateral;  3C/RM 21 To Follow Dr Annette Stable   POLYPECTOMY  08/17/2020   Procedure: POLYPECTOMY;  Surgeon: Harvel Quale, MD;  [facility];  Service: Gastroenterology;;   POLYPECTOMY  10/23/2021   Procedure: POLYPECTOMY;  Surgeon: Harvel Quale, MD;  [facility];  Service: Gastroenterology;;   RIGHT HEART CATH N/A 09/01/2022   Procedure: RIGHT HEART CATH;  Surgeon: Hebert Soho, DO;  Location: [place] [facility];  Service: Cardiovascular;  Laterality: N/A;   RIGHT/LEFT HEART CATH AND CORONARY ANGIOGRAPHY N/A 06/10/2022   Procedure: RIGHT/LEFT HEART CATH AND CORONARY ANGIOGRAPHY;  Surgeon: Hebert Soho, DO;  Location: [place] [facility];  Service: Cardiovascular;  Laterality: N/A;   SHOULDER ACROMIOPLASTY Right 07/18/2015   Procedure: SHOULDER ACROMIOPLASTY;  Surgeon: Earlie Server, MD;  Location: [place];  Service: Orthopedics;  Laterality: Right;   SHOULDER ARTHROSCOPY Right 07/18/2015   Procedure: Right shoulder arthroscopy with debridement;  Surgeon: Earlie Server, MD;  Location: [place];  Service: Orthopedics;  Laterality: Right;   SHOULDER SURGERY     x3   TONSILLECTOMY      Family History  Problem Relation Age of Onset   Colon cancer Other      Social History   Tobacco Use  Smoking Status Former   Packs/day: 2.00   Years: 17.00   Total pack years: 34.00   Types: Cigarettes   Start date: 02/17/1968   Quit date: 02/28/1986   Years since quitting: 36.5  Smokeless Tobacco Never    Social History   Substance and  Sexual Activity  Alcohol Use No   Alcohol/week: 0.0 standard drinks of alcohol     Allergies  Allergen Reactions   Lisinopril Cough    No current facility-administered medications for this encounter.   Current Outpatient Medications  Medication Sig Dispense Refill   acetaminophen (TYLENOL) 650 MG CR tablet Take 650-1,300 mg by mouth every 8 (eight) hours as needed for pain.     albuterol (VENTOLIN HFA) 108 (90 Base) MCG/ACT inhaler Inhale 2 puffs into the lungs every 6 (six) hours as needed for wheezing or shortness of breath. 8 g 2   ALPRAZolam (XANAX) 0.25 MG tablet Take 0.25 mg by mouth at bedtime as needed for anxiety or sleep.     amiodarone (PACERONE) 200 MG tablet Take 1 tablet (200 mg total) by mouth 2 (two) times daily. 180 tablet 3  aspirin EC 81 MG tablet Take 81 mg by mouth in the morning.     atorvastatin (LIPITOR) 40 MG tablet Take 1 tablet (40 mg total) by mouth daily. 90 tablet 1   Cholecalciferol (VITAMIN D3) 125 MCG (5000 UT) TABS Take 1 tablet by mouth in the morning.     diazepam (VALIUM) 5 MG tablet Take 5 mg by mouth daily as needed (vertigo).     empagliflozin (JARDIANCE) 10 MG TABS tablet Take 10 mg by mouth every evening.     furosemide (LASIX) 40 MG tablet Take 40 mg by mouth 2 (two) times daily.     guaiFENesin (MUCINEX) 600 MG 12 hr tablet Take 600 mg by mouth daily as needed (congestion.).     omeprazole (PRILOSEC) 20 MG capsule Take 20 mg by mouth daily before breakfast.     ondansetron (ZOFRAN) 4 MG tablet Take 1 tablet (4 mg total) by mouth every 6 (six) hours as needed for nausea. 20 tablet 0   repaglinide (PRANDIN) 2 MG tablet Take 2 mg by mouth 3 (three) times daily before meals.      sacubitril-valsartan (ENTRESTO) 24-26 MG Take 1 tablet by mouth twice daily 180 tablet 1   spironolactone (ALDACTONE) 25 MG tablet Take 0.5 tablets (12.5 mg total) by mouth daily. 45 tablet 3   zinc gluconate 50 MG tablet Take 50 mg by mouth in the morning.     Digoxin  62.5 MCG TABS Take 1 tablet by mouth daily. 30 tablet 5   Multiple Vitamin (MULTIVITAMIN WITH MINERALS) TABS tablet Take 1 tablet by mouth daily. 30 tablet 5   traZODone (DESYREL) 50 MG tablet Take 1 tablet (50 mg total) by mouth at bedtime as needed for sleep. 30 tablet 1    ROS 14 point ROS reviewed and negative except as per HPI   PHYSICAL EXAMINATION: BP 123/70 (BP Location: Left Arm)   Pulse 88   Temp 97.6 F (36.4 C) (Oral)   Resp 13   Ht '5\' 9"'$  (1.753 m)   Wt 83.7 kg   SpO2 96%   BMI 27.25 kg/m   Gen: NAD Neuro: Alert and oriented x3, sitting up, energetic Chest: no prior sternotomy scar Resp: Nonlaboured Cv regular Abd: Soft, ntnd Extr: WWP  Diagnostic Studies & Laboratory data:    ECG:NSR   ECHO: - Echo (06/08/22): EF 30-35%, global HK, RV mildly reduced, mild MR   - Echo (7/23): EF 45%, RV normal   - Echo (8/22): EF 40-45%, RV normal   - Echo (7/21): EF 50%, RV normal  - LHC (09/2018): Moderate nonobstructive one-vessel coronary artery disease, prox RCA 40% stenosed.   - Echo (08/2018): EF 40%, RV normal  - Echo (05/2016): EF 35-40%, RV normal   - Echo (05/2014): EF 25-30%, RV mildly reduced  - Echo (05/2012): EF 50%, RV normal    CATH: R/LHC (10/23): nonobstructive CAD RA 8 PA 52/25 (34 mean) PCWP 14 (mean) CO/CI (Fick): 4.3/2.1                                 PVR 4.6 WU          PAPi 3.4                 Recent Radiology Findings:   ECHOCARDIOGRAM COMPLETE  Result Date: 09/02/2022    ECHOCARDIOGRAM REPORT   Patient Name:   KEILYN NADAL Date of Exam: 09/02/2022  Medical Rec #:  [id number]        Height:       69.0 in Accession #:    [id number]       Weight:       184.5 lb Date of Birth:  09/16/1950        BSA:          1.996 m Patient Age:    69 years         BP:           123/70 mmHg Patient Gender: M                HR:           90 bpm. Exam Location:  Inpatient Procedure: 2D Echo, Cardiac Doppler, Color Doppler and Intracardiac             Opacification Agent Indications:     CHF- Acute systolic  History:         Patient has prior history of Echocardiogram examinations, most                  recent 06/08/2022. CHF and Cardiomyopathy, CAD,                  Arrythmias:NSVT, Signs/Symptoms:Dyspnea, Shortness of Breath,                  Edema and Fatigue; Risk Factors:Diabetes. Hx of acute                  promyelocytic leukemia s/p ATRA.  Sonographer:     Eartha Inch Referring Phys:  [id number] ALMA L DIAZ Diagnosing Phys: Dorris Carnes MD IMPRESSIONS  1. Left ventricular ejection fraction, by estimation, is 25%. Left ventricular ejection fraction by 2D MOD biplane is 27.6 %. The left ventricle has severely decreased function. The left ventricle demonstrates global hypokinesis. The left ventricular internal cavity size was moderately to severely dilated. Left ventricular diastolic parameters are consistent with Grade II diastolic dysfunction (pseudonormalization).  2. Right ventricular systolic function is moderately reduced. The right ventricular size is mildly enlarged.  3. Left atrial size was mildly dilated.  4. Right atrial size was moderately dilated.  5. The mitral valve is normal in structure. Mild to moderate mitral valve regurgitation. No evidence of mitral stenosis.  6. The aortic valve is normal in structure. Aortic valve regurgitation is moderate. No aortic stenosis is present. Aortic regurgitation PHT measures 226 msec. Aortic valve area, by VTI measures 2.51 cm. Aortic valve mean gradient measures 3.0 mmHg. Aortic valve Vmax measures 1.07 m/s.  7. There is borderline dilatation of the ascending aorta, measuring 37 mm.  8. The inferior vena cava is dilated in size with >50% respiratory variability, suggesting right atrial pressure of 8 mmHg. FINDINGS  Left Ventricle: Left ventricular ejection fraction, by estimation, is 25%. Left ventricular ejection fraction by 2D MOD biplane is 27.6 %. The left ventricle has severely decreased function.  The left ventricle demonstrates global hypokinesis. Definity contrast agent was given IV to delineate the left ventricular endocardial borders. The left ventricular internal cavity size was moderately to severely dilated. There is no left ventricular hypertrophy. Left ventricular diastolic parameters are consistent with Grade II diastolic dysfunction (pseudonormalization). Right Ventricle: The right ventricular size is mildly enlarged. No increase in right ventricular wall thickness. Right ventricular systolic function is moderately reduced. Left Atrium: Left atrial size was mildly dilated. Right Atrium: Right atrial size was moderately dilated. Pericardium: Trivial pericardial  effusion is present. Mitral Valve: The mitral valve is normal in structure. Mild to moderate mitral valve regurgitation. No evidence of mitral valve stenosis. Tricuspid Valve: The tricuspid valve is normal in structure. Tricuspid valve regurgitation is mild. Aortic Valve: The aortic valve is normal in structure. Aortic valve regurgitation is moderate. Aortic regurgitation PHT measures 226 msec. No aortic stenosis is present. Aortic valve mean gradient measures 3.0 mmHg. Aortic valve peak gradient measures 4.6 mmHg. Aortic valve area, by VTI measures 2.51 cm. Pulmonic Valve: The pulmonic valve was normal in structure. Pulmonic valve regurgitation is mild. Aorta: The aortic root and ascending aorta are structurally normal, with no evidence of dilitation. There is borderline dilatation of the ascending aorta, measuring 37 mm. Venous: The inferior vena cava is dilated in size with greater than 50% respiratory variability, suggesting right atrial pressure of 8 mmHg. IAS/Shunts: No atrial level shunt detected by color flow Doppler.  LEFT VENTRICLE PLAX 2D                        Biplane EF (MOD) LVIDd:         6.00 cm         LV Biplane EF:   Left LVIDs:         5.10 cm                          ventricular LV PW:         0.80 cm                           ejection LV IVS:        0.60 cm                          fraction by LVOT diam:     2.00 cm                          2D MOD LV SV:         46                               biplane is LV SV Index:   23                               27.6 %. LVOT Area:     3.14 cm                                Diastology                                LV e' medial:    4.60 cm/s LV Volumes (MOD)               LV E/e' medial:  29.3 LV vol d, MOD    163.0 ml      LV e' lateral:   9.07 cm/s A2C:                           LV E/e' lateral: 14.9 LV vol d, MOD  149.0 ml A4C: LV vol s, MOD    120.0 ml A2C: LV vol s, MOD    105.0 ml A4C: LV SV MOD A2C:   43.0 ml LV SV MOD A4C:   149.0 ml LV SV MOD BP:    43.1 ml RIGHT VENTRICLE            IVC RV S prime:     9.21 cm/s  IVC diam: 2.10 cm TAPSE (M-mode): 1.4 cm LEFT ATRIUM             Index        RIGHT ATRIUM           Index LA diam:        4.60 cm 2.30 cm/m   RA Area:     25.40 cm LA Vol (A2C):   80.6 ml 40.37 ml/m  RA Volume:   87.10 ml  43.63 ml/m LA Vol (A4C):   62.3 ml 31.20 ml/m LA Biplane Vol: 74.0 ml 37.07 ml/m  AORTIC VALVE                    PULMONIC VALVE AV Area (Vmax):    2.30 cm     PR End Diast Vel: 11.42 msec AV Area (Vmean):   2.23 cm AV Area (VTI):     2.51 cm AV Vmax:           107.00 cm/s AV Vmean:          84.400 cm/s AV VTI:            0.183 m AV Peak Grad:      4.6 mmHg AV Mean Grad:      3.0 mmHg LVOT Vmax:         78.30 cm/s LVOT Vmean:        60.000 cm/s LVOT VTI:          0.146 m LVOT/AV VTI ratio: 0.80 AI PHT:            226 msec  AORTA Ao Root diam: 3.00 cm Ao Asc diam:  3.70 cm MITRAL VALVE                TRICUSPID VALVE MV Area (PHT): 4.74 cm     TR Peak grad:   28.1 mmHg MV Decel Time: 160 msec     TR Vmax:        265.00 cm/s MR Peak grad: 34.3 mmHg MR Vmax:      293.00 cm/s   SHUNTS MV E velocity: 135.00 cm/s  Systemic VTI:  0.15 m MV A velocity: 96.40 cm/s   Systemic Diam: 2.00 cm MV E/A ratio:  1.40 Dorris Carnes MD Electronically signed by Dorris Carnes MD Signature Date/Time: 09/02/2022/5:45:21 PM    Final    VAS [place] LOWER EXTREMITY VENOUS (DVT)  Result Date: 09/02/2022  Lower Venous DVT Study Patient Name:  DAWIT TANKARD  Date of Exam:   09/02/2022 Medical Rec #: [id number]         Accession #:    [id number] Date of Birth: [birth year]         Patient Gender: M Patient Age:   24 years Exam Location:  [facility] Procedure:      VAS [place] LOWER EXTREMITY VENOUS (DVT) Referring Phys: Hebert Soho --------------------------------------------------------------------------------  Indications: Pre-VAD.  Risk Factors: None identified. Comparison Study: No prior studies. Performing Technologist: Oliver Hum RVT  Examination Guidelines: A complete evaluation includes B-mode imaging, spectral Doppler,  color Doppler, and power Doppler as needed of all accessible portions of each vessel. Bilateral testing is considered an integral part of a complete examination. Limited examinations for reoccurring indications may be performed as noted. The reflux portion of the exam is performed with the patient in reverse Trendelenburg.  +---------+---------------+---------+-----------+----------+--------------+ RIGHT    CompressibilityPhasicitySpontaneityPropertiesThrombus Aging +---------+---------------+---------+-----------+----------+--------------+ CFV      Full           Yes      Yes                                 +---------+---------------+---------+-----------+----------+--------------+ SFJ      Full                                                        +---------+---------------+---------+-----------+----------+--------------+ FV Prox  Full                                                        +---------+---------------+---------+-----------+----------+--------------+ FV Mid   Full                                                        +---------+---------------+---------+-----------+----------+--------------+ FV  DistalFull                                                        +---------+---------------+---------+-----------+----------+--------------+ PFV      Full                                                        +---------+---------------+---------+-----------+----------+--------------+ POP      Full           Yes      Yes                                 +---------+---------------+---------+-----------+----------+--------------+ PTV      Full                                                        +---------+---------------+---------+-----------+----------+--------------+ PERO     Full                                                        +---------+---------------+---------+-----------+----------+--------------+   +---------+---------------+---------+-----------+----------+--------------+  LEFT     CompressibilityPhasicitySpontaneityPropertiesThrombus Aging +---------+---------------+---------+-----------+----------+--------------+ CFV      Full           Yes      Yes                                 +---------+---------------+---------+-----------+----------+--------------+ SFJ      Full                                                        +---------+---------------+---------+-----------+----------+--------------+ FV Prox  Full                                                        +---------+---------------+---------+-----------+----------+--------------+ FV Mid   Full                                                        +---------+---------------+---------+-----------+----------+--------------+ FV DistalFull                                                        +---------+---------------+---------+-----------+----------+--------------+ PFV      Full                                                        +---------+---------------+---------+-----------+----------+--------------+ POP      Full           Yes      Yes                                  +---------+---------------+---------+-----------+----------+--------------+ PTV      Full                                                        +---------+---------------+---------+-----------+----------+--------------+ PERO     Full                                                        +---------+---------------+---------+-----------+----------+--------------+     Summary: RIGHT: - There is no evidence of deep vein thrombosis in the lower extremity.  - No cystic structure found in the popliteal fossa.  LEFT: - There is no evidence of deep vein thrombosis in the lower extremity.  - No  cystic structure found in the popliteal fossa.  *See table(s) above for measurements and observations. Electronically signed by Deitra Mayo MD on 09/02/2022 at 4:23:59 PM.    Final    VAS US DOPPLER PRE VAD  Result Date: 09/02/2022 PERIOPERATIVE VASCULAR EVALUATION Patient Name:  DASHAUN ONSTOTT  Date of Exam:   09/02/2022 Medical Rec #: [id number]         Accession #:    [id number] Date of Birth: 01/18/1951         Patient Gender: M Patient Age:   27 years Exam Location:  [facility] Procedure:      VAS US DOPPLER PRE VAD Referring Phys: Hebert Soho --------------------------------------------------------------------------------  Indications:      Pre-VAD. Risk Factors:     Hypertension, hyperlipidemia, Diabetes. Comparison Study: No prior studies. Performing Technologist: Carlos Levering RVT  Examination Guidelines: A complete evaluation includes B-mode imaging, spectral Doppler, color Doppler, and power Doppler as needed of all accessible portions of each vessel. Bilateral testing is considered an integral part of a complete examination. Limited examinations for reoccurring indications may be performed as noted.  Right Carotid Findings: +----------+--------+--------+--------+-----------------------+--------+           PSV cm/sEDV cm/sStenosisDescribe                Comments +----------+--------+--------+--------+-----------------------+--------+ CCA Prox  69      10              smooth and heterogenous         +----------+--------+--------+--------+-----------------------+--------+ CCA Distal56      11              smooth and heterogenous         +----------+--------+--------+--------+-----------------------+--------+ ICA Prox  53      13              smooth and heterogenous         +----------+--------+--------+--------+-----------------------+--------+ ICA Distal76      20                                     tortuous +----------+--------+--------+--------+-----------------------+--------+ ECA       128                                                     +----------+--------+--------+--------+-----------------------+--------+ +----------+--------+-------+--------+------------+           PSV cm/sEDV cmsDescribeArm Pressure +----------+--------+-------+--------+------------+ Subclavian119                    125          +----------+--------+-------+--------+------------+ +---------+--------+--+--------+-+---------+ VertebralPSV cm/s71EDV cm/s7Antegrade +---------+--------+--+--------+-+---------+ Left Carotid Findings: +----------+--------+--------+--------+-----------------------+--------+           PSV cm/sEDV cm/sStenosisDescribe               Comments +----------+--------+--------+--------+-----------------------+--------+ CCA Prox  96      18              smooth and heterogenous         +----------+--------+--------+--------+-----------------------+--------+ CCA Distal81      11              smooth and heterogenous         +----------+--------+--------+--------+-----------------------+--------+ ICA Prox  71      11  smooth and heterogenoustortuous +----------+--------+--------+--------+-----------------------+--------+ ICA Distal100     20                                      tortuous +----------+--------+--------+--------+-----------------------+--------+ ECA       99      8                                               +----------+--------+--------+--------+-----------------------+--------+ +----------+--------+--------+--------+------------+ SubclavianPSV cm/sEDV cm/sDescribeArm Pressure +----------+--------+--------+--------+------------+           86                      127          +----------+--------+--------+--------+------------+ +---------+--------+--+--------+--+---------+ VertebralPSV cm/s63EDV cm/s19Antegrade +---------+--------+--+--------+--+---------+  ABI Findings: +--------+------------------+-----+-----------+--------+ Right   Rt Pressure (mmHg)IndexWaveform   Comment  +--------+------------------+-----+-----------+--------+ [id number]                    triphasic           +--------+------------------+-----+-----------+--------+ PTA     150               1.18 multiphasic         +--------+------------------+-----+-----------+--------+ DP      144               1.13 multiphasic         +--------+------------------+-----+-----------+--------+ +--------+------------------+-----+-----------+-------+ Left    Lt Pressure (mmHg)IndexWaveform   Comment +--------+------------------+-----+-----------+-------+ [id number]                    triphasic          +--------+------------------+-----+-----------+-------+ PTA     149               1.17 multiphasic        +--------+------------------+-----+-----------+-------+ DP      150               1.18 multiphasic        +--------+------------------+-----+-----------+-------+ +-------+---------------+----------------+ ABI/TBIToday's ABI/TBIPrevious ABI/TBI +-------+---------------+----------------+ Right  1.18                            +-------+---------------+----------------+ Left   1.18                             +-------+---------------+----------------+  Summary: Right Carotid: Velocities in the right ICA are consistent with a 1-39% stenosis. Left Carotid: Velocities in the left ICA are consistent with a 1-39% stenosis. Vertebrals: Bilateral vertebral arteries demonstrate antegrade flow.  *See table(s) above for measurements and observations. Right ABI: Resting right ankle-brachial index is within normal range. Left ABI: Resting left ankle-brachial index is within normal range.  Electronically signed by Deitra Mayo MD on 09/02/2022 at 4:20:37 PM.    Final    CT CHEST ABDOMEN PELVIS W CONTRAST  Result Date: 09/02/2022 CLINICAL DATA:  LVAD evaluation EXAM: CT CHEST, ABDOMEN, AND PELVIS WITH CONTRAST TECHNIQUE: Multidetector CT imaging of the chest, abdomen and pelvis was performed following the standard protocol during bolus administration of intravenous contrast. RADIATION DOSE REDUCTION: This exam was performed according to the departmental dose-optimization program which includes automated exposure control, adjustment of the mA and/or kV according  to patient size and/or use of iterative reconstruction technique. CONTRAST:  43m OMNIPAQUE IOHEXOL 350 MG/ML SOLN COMPARISON:  CT abdomen and pelvis dated May 11, 2019 FINDINGS: CT CHEST FINDINGS Cardiovascular: Cardiomegaly. Trace pericardial effusion. Normal caliber thoracic aorta with moderate atherosclerotic disease. Moderate three-vessel coronary artery calcifications. Mediastinum/Nodes: Esophagus and thyroid are unremarkable. No pathologically enlarged nodes seen in the chest. Lungs/Pleura: Central airways are patent. Mild septal thickening. Trace right-greater-than-left bilateral pleural effusions and bibasilar atelectasis. New irregular solid pulmonary nodule of the posterior left upper lobe measuring 6 mm on series 4, image 52. Musculoskeletal: No chest wall mass or suspicious bone lesions identified. CT ABDOMEN PELVIS FINDINGS Hepatobiliary: No focal  liver abnormality is seen. Status post cholecystectomy. No biliary dilatation. Pancreas: Unremarkable. No pancreatic ductal dilatation or surrounding inflammatory changes. Spleen: Normal in size without focal abnormality. Adrenals/Urinary Tract: Bilateral adrenal glands are unremarkable. No hydronephrosis. Bilateral nonobstructing renal stones. No suspicious renal lesions. bladder is unremarkable. Stomach/Bowel: Stomach is within normal limits. Diverticulosis. No evidence of bowel wall thickening, distention, or inflammatory changes. Vascular/Lymphatic: Aortic atherosclerosis.  Trace pelvic ascites. Reproductive: Prostatomegaly. Other: No abdominal wall hernia or abnormality. No abdominopelvic ascites. Musculoskeletal: No acute or significant osseous findings. IMPRESSION: 1. Cardiomegaly and mild pulmonary edema. 2. New irregular solid pulmonary nodule of the posterior left upper lobe measuring 6 mm. Non-contrast chest CT at 6-12 months is recommended. If the nodule is stable at time of repeat CT, then future CT at 18-24 months (from today's scan) is considered optional for low-risk patients, but is recommended for high-risk patients. This recommendation follows the consensus statement: Guidelines for Management of Incidental Pulmonary Nodules Detected on CT Images: From the Fleischner Society 2017; Radiology 2017; 284:228-243. 3. No acute findings in the abdomen or pelvis. 4. Aortic Atherosclerosis (ICD10-I70.0). Electronically Signed   By: LYetta GlassmanM.D.   On: 09/02/2022 08:36   DG Orthopantogram  Result Date: 09/01/2022 CLINICAL DATA:  Preoperative evaluation EXAM: ORTHOPANTOGRAM/PANORAMIC COMPARISON:  None Available. FINDINGS: Scattered dental fillings. Small dental caries noted at RIGHT third maxillary molar. No periodontal lucencies identified. IMPRESSION: Small dental caries at RIGHT third maxillary molar. No periodontal disease identified. Electronically Signed   By: MLavonia DanaM.D.   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 Severely reduced cardiac index. Hemodynamic parameters suggestive of preserved RV function. Frequent PVCs and ectopy during case. RECOMMENDATIONS: Admit for expedited LVAD evaluation. Aditya Sabharwal Advanced Heart Failure 5:39 PM      I have independently reviewed the above radiology studies  and reviewed the findings with the patient.   Recent Lab Findings: Lab Results  Component Value Date   WBC 4.7 09/02/2022   HGB 14.0 09/02/2022   HCT 39.8 09/02/2022   PLT 192 09/02/2022   GLUCOSE 122 (H) 09/02/2022   CHOL 134 09/02/2022   TRIG 75 09/02/2022   HDL 34 (L) 09/02/2022   LDLCALC 85 09/02/2022   ALT 24 09/02/2022   AST 16 09/02/2022  NA 139 09/02/2022   K 3.1 (L) 09/02/2022   CL 105 09/02/2022   CREATININE 1.11 09/02/2022   BUN 19 09/02/2022   CO2 24 09/02/2022   TSH 2.329 09/01/2022   INR 1.3 (H) 09/02/2022   HGBA1C 7.4 (H) 09/02/2022     Assessment / Plan:   53M with history of leukemia, T2DM, SVT presents for LVAD evaluation.    Good candidate for LVAD. Socially great support, wife at bedside.   Echo reviewed at bedside - LVIDD 6cm, mild-mod AI May need a park stitch.   EF <25% by echo and is limited on medical therapy by hypotension. Admitted current for expidited workup.  We talked about transplant as well and he was not interested in this for psychosocial reasons. He understood his age could be an issue for transplant listing in addition.    Of note, he is listed as a difficult intubation from a shoulder surgery in 2005-2008.    Risks/benefits/alternatives were discussed at length (80% straightforward recovery, 15% morbidity [any organ, bleeding/stroke/reoperation related to LVAD and anticoagulation, as well as predominantly age-related issues I.e. tissue quality, 5% mortality]. Straightforward recovery typically entails 4-6 ICU days, 7-10 days on the floor, seeing me back in clinic at 1 week and 1 month postoperatively. No lifting greater than 30 lbs for 6 weeks. Total recovery expected by 2 months. All questions were asked and answered.   For now we are looking at Feb 14th operative date.    I  spent 60 minutes with  the patient face to face in counseling and coordination of care.    Pierre Bali Recardo Linn 09/09/2022 4:30 PM

## 2022-09-09 NOTE — Assessment & Plan Note (Signed)
See Ct chest 1/23 24 x 6 mm LUL nodule> place in reminder for 12 month f/u is he is low risk

## 2022-09-09 NOTE — H&P (Signed)
[address]       [place] [address]             [phone]                    [name]  Medical Record #[id number] Date of Birth: [birth year]  Referring: No ref. provider found Primary Care: Glenda Chroman, MD Primary Cardiologist: [name], MD  Chief Complaint:   No chief complaint on file.   History of Present Illness:    Ricardo Zuniga 72 y.o. male referred for consideration of LVAD.  Hx of NICM that now is limited for GDMT by hypotension. Increasing fatigue over the last several weeks.   Comorbid conditions include hx of acute promyelocytic leukemia s/p ATRA ([facility] 2014)   LHC/RHC during admit w/ nonobstructive CAD & moderately reduced CI (2.1 L/min/m2). He was ultimately discharged home on low dose GDMT.    Prior to this admit, his cardiac history dates back to 2015 when he was found to have an LVEF of 25% felt to be triggered by endocarditis.      Past Medical History:  Diagnosis Date   Anal fissure    APML (acute promyelocytic leukemia) in remission ([facility])    Completed treatment 04/2013   CHF (congestive heart failure) ([facility])    Coronary artery disease    Nonobstructive at cardiac catheterization 09/2018   Difficult intubation    w/shoulder surgery at surgical center between 2005-2008   Gastroesophageal reflux disease    History of blood transfusion 2014   History of kidney stones    passed stones   Hypercholesteremia    Neuropathy    bilateral feet   Nonischemic cardiomyopathy ([facility])    a. EF 20% in 2015 - thought to possibly be viral induced or chemotherapy induced from treatments in 2014 b. at 35-40% by echo in 05/2016    Peptic ulcer disease    Pneumonia    Yrs ago   Type 2 diabetes mellitus ([facility])    Ureteral colic    Vertigo    tx with valium prn   Wears glasses    Wears hearing aid in both ears     Past Surgical History:  Procedure Laterality Date   ANAL FISSURE REPAIR     APPENDECTOMY     BIOPSY   08/17/2020   Procedure: BIOPSY;  Surgeon: Harvel Quale, MD;  [facility];  Service: Gastroenterology;;   Wilmon Pali RELEASE Left 10/16/2017   Procedure: LEFT CARPAL TUNNEL RELEASE;  Surgeon: Earlie Server, MD;  Location: [facility];  Service: Orthopedics;  Laterality: Left;   CARPAL TUNNEL RELEASE Right 11/25/2019   Procedure: CARPAL TUNNEL RELEASE;  Surgeon: Earlie Server, MD;  Location: [facility];  Service: Orthopedics;  Laterality: Right;   CATARACT EXTRACTION, BILATERAL     CERVICAL DISC ARTHROPLASTY N/A 01/05/2018   Procedure: Artificial Cervical Disc ReplacementCervical Three-Four, Cervical Four-Five;  Surgeon: Kristeen Miss, MD;  Location: [place];  Service: Neurosurgery;  Laterality: N/A;   CERVICAL FUSION     CHOLECYSTECTOMY     COLONOSCOPY N/A 06/27/2015   Procedure: COLONOSCOPY;  Surgeon: Rogene Houston, MD;  [facility];  Service: Endoscopy;  Laterality: N/A;  730   COLONOSCOPY WITH PROPOFOL N/A 08/17/2020   Procedure: COLONOSCOPY WITH PROPOFOL;  Surgeon: Harvel Quale, MD;  [facility];  Service: Gastroenterology;  Laterality: N/A;  11:15   COLONOSCOPY WITH PROPOFOL  N/A 10/23/2021   Procedure: COLONOSCOPY WITH PROPOFOL;  Surgeon: Harvel Quale, MD;  [facility];  Service: Gastroenterology;  Laterality: N/A;  9:45am   ESOPHAGOGASTRODUODENOSCOPY (EGD) WITH PROPOFOL N/A 08/17/2020   Procedure: ESOPHAGOGASTRODUODENOSCOPY (EGD) WITH PROPOFOL;  Surgeon: Harvel Quale, MD;  [facility];  Service: Gastroenterology;  Laterality: N/A;   KNEE SURGERY     x3   LEFT AND RIGHT HEART CATHETERIZATION WITH CORONARY ANGIOGRAM N/A 12/12/2013   Procedure: LEFT AND RIGHT HEART CATHETERIZATION WITH CORONARY ANGIOGRAM;  Surgeon: [name], MD;  Location: [facility];  Service: Cardiovascular;  Laterality: N/A;   LEFT HEART CATH AND CORONARY ANGIOGRAPHY N/A 09/17/2018   Procedure: LEFT HEART CATH AND  CORONARY ANGIOGRAPHY;  Surgeon: Wellington Hampshire, MD;  Location: [place] [facility];  Service: Cardiovascular;  Laterality: N/A;   LUMBAR FUSION     LUMBAR LAMINECTOMY/DECOMPRESSION MICRODISCECTOMY Bilateral 01/02/2022   Procedure: Bilateral Lumbar Three-Four/Lumbar Four-Five Sublaminar decompression;  Surgeon: Kristeen Miss, MD;  Location: [place];  Service: Neurosurgery;  Laterality: Bilateral;  3C/RM 21 To Follow Dr Annette Stable   POLYPECTOMY  08/17/2020   Procedure: POLYPECTOMY;  Surgeon: Harvel Quale, MD;  [facility];  Service: Gastroenterology;;   POLYPECTOMY  10/23/2021   Procedure: POLYPECTOMY;  Surgeon: Harvel Quale, MD;  [facility];  Service: Gastroenterology;;   RIGHT HEART CATH N/A 09/01/2022   Procedure: RIGHT HEART CATH;  Surgeon: Hebert Soho, DO;  Location: [place] [facility];  Service: Cardiovascular;  Laterality: N/A;   RIGHT/LEFT HEART CATH AND CORONARY ANGIOGRAPHY N/A 06/10/2022   Procedure: RIGHT/LEFT HEART CATH AND CORONARY ANGIOGRAPHY;  Surgeon: Hebert Soho, DO;  Location: [place] [facility];  Service: Cardiovascular;  Laterality: N/A;   SHOULDER ACROMIOPLASTY Right 07/18/2015   Procedure: SHOULDER ACROMIOPLASTY;  Surgeon: Earlie Server, MD;  Location: [place];  Service: Orthopedics;  Laterality: Right;   SHOULDER ARTHROSCOPY Right 07/18/2015   Procedure: Right shoulder arthroscopy with debridement;  Surgeon: Earlie Server, MD;  Location: [place];  Service: Orthopedics;  Laterality: Right;   SHOULDER SURGERY     x3   TONSILLECTOMY      Family History  Problem Relation Age of Onset   Colon cancer Other      Social History   Tobacco Use  Smoking Status Former   Packs/day: 2.00   Years: 17.00   Total pack years: 34.00   Types: Cigarettes   Start date: 02/17/1968   Quit date: 02/28/1986   Years since quitting: 36.5  Smokeless Tobacco Never    Social History   Substance and  Sexual Activity  Alcohol Use No   Alcohol/week: 0.0 standard drinks of alcohol     Allergies  Allergen Reactions   Lisinopril Cough    No current facility-administered medications for this encounter.   Current Outpatient Medications  Medication Sig Dispense Refill   acetaminophen (TYLENOL) 650 MG CR tablet Take 650-1,300 mg by mouth every 8 (eight) hours as needed for pain.     albuterol (VENTOLIN HFA) 108 (90 Base) MCG/ACT inhaler Inhale 2 puffs into the lungs every 6 (six) hours as needed for wheezing or shortness of breath. 8 g 2   ALPRAZolam (XANAX) 0.25 MG tablet Take 0.25 mg by mouth at bedtime as needed for anxiety or sleep.     amiodarone (PACERONE) 200 MG tablet Take 1 tablet (200 mg total) by mouth 2 (two) times daily. 180 tablet 3  aspirin EC 81 MG tablet Take 81 mg by mouth in the morning.     atorvastatin (LIPITOR) 40 MG tablet Take 1 tablet (40 mg total) by mouth daily. 90 tablet 1   Cholecalciferol (VITAMIN D3) 125 MCG (5000 UT) TABS Take 1 tablet by mouth in the morning.     diazepam (VALIUM) 5 MG tablet Take 5 mg by mouth daily as needed (vertigo).     empagliflozin (JARDIANCE) 10 MG TABS tablet Take 10 mg by mouth every evening.     furosemide (LASIX) 40 MG tablet Take 40 mg by mouth 2 (two) times daily.     guaiFENesin (MUCINEX) 600 MG 12 hr tablet Take 600 mg by mouth daily as needed (congestion.).     omeprazole (PRILOSEC) 20 MG capsule Take 20 mg by mouth daily before breakfast.     ondansetron (ZOFRAN) 4 MG tablet Take 1 tablet (4 mg total) by mouth every 6 (six) hours as needed for nausea. 20 tablet 0   repaglinide (PRANDIN) 2 MG tablet Take 2 mg by mouth 3 (three) times daily before meals.      sacubitril-valsartan (ENTRESTO) 24-26 MG Take 1 tablet by mouth twice daily 180 tablet 1   spironolactone (ALDACTONE) 25 MG tablet Take 0.5 tablets (12.5 mg total) by mouth daily. 45 tablet 3   zinc gluconate 50 MG tablet Take 50 mg by mouth in the morning.     Digoxin  62.5 MCG TABS Take 1 tablet by mouth daily. 30 tablet 5   Multiple Vitamin (MULTIVITAMIN WITH MINERALS) TABS tablet Take 1 tablet by mouth daily. 30 tablet 5   traZODone (DESYREL) 50 MG tablet Take 1 tablet (50 mg total) by mouth at bedtime as needed for sleep. 30 tablet 1    ROS 14 point ROS reviewed and negative except as per HPI   PHYSICAL EXAMINATION: BP 123/70 (BP Location: Left Arm)   Pulse 88   Temp 97.6 F (36.4 C) (Oral)   Resp 13   Ht '5\' 9"'$  (1.753 m)   Wt 83.7 kg   SpO2 96%   BMI 27.25 kg/m   Gen: NAD Neuro: Alert and oriented x3, sitting up, energetic Chest: no prior sternotomy scar Resp: Nonlaboured Cv regular Abd: Soft, ntnd Extr: WWP  Diagnostic Studies & Laboratory data:    ECG:NSR   ECHO: - Echo (06/08/22): EF 30-35%, global HK, RV mildly reduced, mild MR   - Echo (7/23): EF 45%, RV normal   - Echo (8/22): EF 40-45%, RV normal   - Echo (7/21): EF 50%, RV normal  - LHC (09/2018): Moderate nonobstructive one-vessel coronary artery disease, prox RCA 40% stenosed.   - Echo (08/2018): EF 40%, RV normal  - Echo (05/2016): EF 35-40%, RV normal   - Echo (05/2014): EF 25-30%, RV mildly reduced  - Echo (05/2012): EF 50%, RV normal    CATH: R/LHC (10/23): nonobstructive CAD RA 8 PA 52/25 (34 mean) PCWP 14 (mean) CO/CI ([place]ick): 4.3/2.1                                 PVR 4.6 WU          PAPi 3.4                 Recent Radiology Findings:   ECHOCARDIOGRAM COMPLETE  Result Date: 09/02/2022    ECHOCARDIOGRAM REPORT   Patient Name:   Ricardo Zuniga Date of Exam: 09/02/2022  Medical Rec #:  [id number]        Height:       69.0 in Accession #:    [id number]       Weight:       184.5 lb Date of Birth:  12-13-1950        BSA:          1.996 m Patient Age:    74 years         BP:           123/70 mmHg Patient Gender: M                HR:           90 bpm. Exam Location:  Inpatient Procedure: 2D Echo, Cardiac Doppler, Color Doppler and Intracardiac             Opacification Agent Indications:     CHF- Acute systolic  History:         Patient has prior history of Echocardiogram examinations, most                  recent 06/08/2022. CHF and Cardiomyopathy, CAD,                  Arrythmias:NSVT, Signs/Symptoms:Dyspnea, Shortness of Breath,                  Edema and Fatigue; Risk Factors:Diabetes. Hx of acute                  promyelocytic leukemia s/p ATRA.  Sonographer:     Eartha Inch Referring Phys:  [id number] ALMA L DIAZ Diagnosing Phys: Dorris Carnes MD IMPRESSIONS  1. Left ventricular ejection fraction, by estimation, is 25%. Left ventricular ejection fraction by 2D MOD biplane is 27.6 %. The left ventricle has severely decreased function. The left ventricle demonstrates global hypokinesis. The left ventricular internal cavity size was moderately to severely dilated. Left ventricular diastolic parameters are consistent with Grade II diastolic dysfunction (pseudonormalization).  2. Right ventricular systolic function is moderately reduced. The right ventricular size is mildly enlarged.  3. Left atrial size was mildly dilated.  4. Right atrial size was moderately dilated.  5. The mitral valve is normal in structure. Mild to moderate mitral valve regurgitation. No evidence of mitral stenosis.  6. The aortic valve is normal in structure. Aortic valve regurgitation is moderate. No aortic stenosis is present. Aortic regurgitation PHT measures 226 msec. Aortic valve area, by VTI measures 2.51 cm. Aortic valve mean gradient measures 3.0 mmHg. Aortic valve Vmax measures 1.07 m/s.  7. There is borderline dilatation of the ascending aorta, measuring 37 mm.  8. The inferior vena cava is dilated in size with >50% respiratory variability, suggesting right atrial pressure of 8 mmHg. FINDINGS  Left Ventricle: Left ventricular ejection fraction, by estimation, is 25%. Left ventricular ejection fraction by 2D MOD biplane is 27.6 %. The left ventricle has severely decreased function.  The left ventricle demonstrates global hypokinesis. Definity contrast agent was given IV to delineate the left ventricular endocardial borders. The left ventricular internal cavity size was moderately to severely dilated. There is no left ventricular hypertrophy. Left ventricular diastolic parameters are consistent with Grade II diastolic dysfunction (pseudonormalization). Right Ventricle: The right ventricular size is mildly enlarged. No increase in right ventricular wall thickness. Right ventricular systolic function is moderately reduced. Left Atrium: Left atrial size was mildly dilated. Right Atrium: Right atrial size was moderately dilated. Pericardium: Trivial pericardial  effusion is present. Mitral Valve: The mitral valve is normal in structure. Mild to moderate mitral valve regurgitation. No evidence of mitral valve stenosis. Tricuspid Valve: The tricuspid valve is normal in structure. Tricuspid valve regurgitation is mild. Aortic Valve: The aortic valve is normal in structure. Aortic valve regurgitation is moderate. Aortic regurgitation PHT measures 226 msec. No aortic stenosis is present. Aortic valve mean gradient measures 3.0 mmHg. Aortic valve peak gradient measures 4.6 mmHg. Aortic valve area, by VTI measures 2.51 cm. Pulmonic Valve: The pulmonic valve was normal in structure. Pulmonic valve regurgitation is mild. Aorta: The aortic root and ascending aorta are structurally normal, with no evidence of dilitation. There is borderline dilatation of the ascending aorta, measuring 37 mm. Venous: The inferior vena cava is dilated in size with greater than 50% respiratory variability, suggesting right atrial pressure of 8 mmHg. IAS/Shunts: No atrial level shunt detected by color flow Doppler.  LEFT VENTRICLE PLAX 2D                        Biplane EF (MOD) LVIDd:         6.00 cm         LV Biplane EF:   Left LVIDs:         5.10 cm                          ventricular LV PW:         0.80 cm                           ejection LV IVS:        0.60 cm                          fraction by LVOT diam:     2.00 cm                          2D MOD LV SV:         46                               biplane is LV SV Index:   23                               27.6 %. LVOT Area:     3.14 cm                                Diastology                                LV e' medial:    4.60 cm/s LV Volumes (MOD)               LV E/e' medial:  29.3 LV vol d, MOD    163.0 ml      LV e' lateral:   9.07 cm/s A2C:                           LV E/e' lateral: 14.9 LV vol d, MOD  149.0 ml A4C: LV vol s, MOD    120.0 ml A2C: LV vol s, MOD    105.0 ml A4C: LV SV MOD A2C:   43.0 ml LV SV MOD A4C:   149.0 ml LV SV MOD BP:    43.1 ml RIGHT VENTRICLE            IVC RV S prime:     9.21 cm/s  IVC diam: 2.10 cm TAPSE (M-mode): 1.4 cm LEFT ATRIUM             Index        RIGHT ATRIUM           Index LA diam:        4.60 cm 2.30 cm/m   RA Area:     25.40 cm LA Vol (A2C):   80.6 ml 40.37 ml/m  RA Volume:   87.10 ml  43.63 ml/m LA Vol (A4C):   62.3 ml 31.20 ml/m LA Biplane Vol: 74.0 ml 37.07 ml/m  AORTIC VALVE                    PULMONIC VALVE AV Area (Vmax):    2.30 cm     PR End Diast Vel: 11.42 msec AV Area (Vmean):   2.23 cm AV Area (VTI):     2.51 cm AV Vmax:           107.00 cm/s AV Vmean:          84.400 cm/s AV VTI:            0.183 m AV Peak Grad:      4.6 mmHg AV Mean Grad:      3.0 mmHg LVOT Vmax:         78.30 cm/s LVOT Vmean:        60.000 cm/s LVOT VTI:          0.146 m LVOT/AV VTI ratio: 0.80 AI PHT:            226 msec  AORTA Ao Root diam: 3.00 cm Ao Asc diam:  3.70 cm MITRAL VALVE                TRICUSPID VALVE MV Area (PHT): 4.74 cm     TR Peak grad:   28.1 mmHg MV Decel Time: 160 msec     TR Vmax:        265.00 cm/s MR Peak grad: 34.3 mmHg MR Vmax:      293.00 cm/s   SHUNTS MV E velocity: 135.00 cm/s  Systemic VTI:  0.15 m MV A velocity: 96.40 cm/s   Systemic Diam: 2.00 cm MV E/A ratio:  1.40 Dorris Carnes MD Electronically signed by Dorris Carnes MD Signature Date/Time: 09/02/2022/5:45:21 PM    Final    VAS [place] LOWER EXTREMITY VENOUS (DVT)  Result Date: 09/02/2022  Lower Venous DVT Study Patient Name:  Ricardo Zuniga  Date of Exam:   09/02/2022 Medical Rec #: [id number]         Accession #:    [id number] Date of Birth: July 14, 1951         Patient Gender: M Patient Age:   79 years Exam Location:  [facility] Procedure:      VAS [place] LOWER EXTREMITY VENOUS (DVT) Referring Phys: Hebert Soho --------------------------------------------------------------------------------  Indications: Pre-VAD.  Risk Factors: None identified. Comparison Study: No prior studies. Performing Technologist: Oliver Hum RVT  Examination Guidelines: A complete evaluation includes B-mode imaging, spectral Doppler,  color Doppler, and power Doppler as needed of all accessible portions of each vessel. Bilateral testing is considered an integral part of a complete examination. Limited examinations for reoccurring indications may be performed as noted. The reflux portion of the exam is performed with the patient in reverse Trendelenburg.  +---------+---------------+---------+-----------+----------+--------------+ RIGHT    CompressibilityPhasicitySpontaneityPropertiesThrombus Aging +---------+---------------+---------+-----------+----------+--------------+ CFV      Full           Yes      Yes                                 +---------+---------------+---------+-----------+----------+--------------+ SFJ      Full                                                        +---------+---------------+---------+-----------+----------+--------------+ FV Prox  Full                                                        +---------+---------------+---------+-----------+----------+--------------+ FV Mid   Full                                                        +---------+---------------+---------+-----------+----------+--------------+ FV  DistalFull                                                        +---------+---------------+---------+-----------+----------+--------------+ PFV      Full                                                        +---------+---------------+---------+-----------+----------+--------------+ POP      Full           Yes      Yes                                 +---------+---------------+---------+-----------+----------+--------------+ PTV      Full                                                        +---------+---------------+---------+-----------+----------+--------------+ PERO     Full                                                        +---------+---------------+---------+-----------+----------+--------------+   +---------+---------------+---------+-----------+----------+--------------+  LEFT     CompressibilityPhasicitySpontaneityPropertiesThrombus Aging +---------+---------------+---------+-----------+----------+--------------+ CFV      Full           Yes      Yes                                 +---------+---------------+---------+-----------+----------+--------------+ SFJ      Full                                                        +---------+---------------+---------+-----------+----------+--------------+ FV Prox  Full                                                        +---------+---------------+---------+-----------+----------+--------------+ FV Mid   Full                                                        +---------+---------------+---------+-----------+----------+--------------+ FV DistalFull                                                        +---------+---------------+---------+-----------+----------+--------------+ PFV      Full                                                        +---------+---------------+---------+-----------+----------+--------------+ POP      Full           Yes      Yes                                  +---------+---------------+---------+-----------+----------+--------------+ PTV      Full                                                        +---------+---------------+---------+-----------+----------+--------------+ PERO     Full                                                        +---------+---------------+---------+-----------+----------+--------------+     Summary: RIGHT: - There is no evidence of deep vein thrombosis in the lower extremity.  - No cystic structure found in the popliteal fossa.  LEFT: - There is no evidence of deep vein thrombosis in the lower extremity.  - No  cystic structure found in the popliteal fossa.  *See table(s) above for measurements and observations. Electronically signed by Deitra Mayo MD on 09/02/2022 at 4:23:59 PM.    Final    VAS US DOPPLER PRE VAD  Result Date: 09/02/2022 PERIOPERATIVE VASCULAR EVALUATION Patient Name:  Ricardo Zuniga  Date of Exam:   09/02/2022 Medical Rec #: [id number]         Accession #:    [id number] Date of Birth: 1951/08/01         Patient Gender: M Patient Age:   24 years Exam Location:  [facility] [place] Procedure:      VAS US DOPPLER PRE VAD Referring Phys: Hebert Soho --------------------------------------------------------------------------------  Indications:      Pre-VAD. Risk Factors:     Hypertension, hyperlipidemia, Diabetes. Comparison Study: No prior studies. Performing Technologist: Carlos Levering RVT  Examination Guidelines: A complete evaluation includes B-mode imaging, spectral Doppler, color Doppler, and power Doppler as needed of all accessible portions of each vessel. Bilateral testing is considered an integral part of a complete examination. Limited examinations for reoccurring indications may be performed as noted.  Right Carotid Findings: +----------+--------+--------+--------+-----------------------+--------+           PSV cm/sEDV cm/sStenosisDescribe                Comments +----------+--------+--------+--------+-----------------------+--------+ CCA Prox  69      10              smooth and heterogenous         +----------+--------+--------+--------+-----------------------+--------+ CCA Distal56      11              smooth and heterogenous         +----------+--------+--------+--------+-----------------------+--------+ ICA Prox  53      13              smooth and heterogenous         +----------+--------+--------+--------+-----------------------+--------+ ICA Distal76      20                                     tortuous +----------+--------+--------+--------+-----------------------+--------+ ECA       128                                                     +----------+--------+--------+--------+-----------------------+--------+ +----------+--------+-------+--------+------------+           PSV cm/sEDV cmsDescribeArm Pressure +----------+--------+-------+--------+------------+ Subclavian119                    125          +----------+--------+-------+--------+------------+ +---------+--------+--+--------+-+---------+ VertebralPSV cm/s71EDV cm/s7Antegrade +---------+--------+--+--------+-+---------+ Left Carotid Findings: +----------+--------+--------+--------+-----------------------+--------+           PSV cm/sEDV cm/sStenosisDescribe               Comments +----------+--------+--------+--------+-----------------------+--------+ CCA Prox  96      18              smooth and heterogenous         +----------+--------+--------+--------+-----------------------+--------+ CCA Distal81      11              smooth and heterogenous         +----------+--------+--------+--------+-----------------------+--------+ ICA Prox  71      11  smooth and heterogenoustortuous +----------+--------+--------+--------+-----------------------+--------+ ICA Distal100     20                                      tortuous +----------+--------+--------+--------+-----------------------+--------+ ECA       99      8                                               +----------+--------+--------+--------+-----------------------+--------+ +----------+--------+--------+--------+------------+ SubclavianPSV cm/sEDV cm/sDescribeArm Pressure +----------+--------+--------+--------+------------+           86                      127          +----------+--------+--------+--------+------------+ +---------+--------+--+--------+--+---------+ VertebralPSV cm/s63EDV cm/s19Antegrade +---------+--------+--+--------+--+---------+  ABI Findings: +--------+------------------+-----+-----------+--------+ Right   Rt Pressure (mmHg)IndexWaveform   Comment  +--------+------------------+-----+-----------+--------+ [id number]                    triphasic           +--------+------------------+-----+-----------+--------+ PTA     150               1.18 multiphasic         +--------+------------------+-----+-----------+--------+ DP      144               1.13 multiphasic         +--------+------------------+-----+-----------+--------+ +--------+------------------+-----+-----------+-------+ Left    Lt Pressure (mmHg)IndexWaveform   Comment +--------+------------------+-----+-----------+-------+ [id number]                    triphasic          +--------+------------------+-----+-----------+-------+ PTA     149               1.17 multiphasic        +--------+------------------+-----+-----------+-------+ DP      150               1.18 multiphasic        +--------+------------------+-----+-----------+-------+ +-------+---------------+----------------+ ABI/TBIToday's ABI/TBIPrevious ABI/TBI +-------+---------------+----------------+ Right  1.18                            +-------+---------------+----------------+ Left   1.18                             +-------+---------------+----------------+  Summary: Right Carotid: Velocities in the right ICA are consistent with a 1-39% stenosis. Left Carotid: Velocities in the left ICA are consistent with a 1-39% stenosis. Vertebrals: Bilateral vertebral arteries demonstrate antegrade flow.  *See table(s) above for measurements and observations. Right ABI: Resting right ankle-brachial index is within normal range. Left ABI: Resting left ankle-brachial index is within normal range.  Electronically signed by Deitra Mayo MD on 09/02/2022 at 4:20:37 PM.    Final    CT CHEST ABDOMEN PELVIS W CONTRAST  Result Date: 09/02/2022 CLINICAL DATA:  LVAD evaluation EXAM: CT CHEST, ABDOMEN, AND PELVIS WITH CONTRAST TECHNIQUE: Multidetector CT imaging of the chest, abdomen and pelvis was performed following the standard protocol during bolus administration of intravenous contrast. RADIATION DOSE REDUCTION: This exam was performed according to the departmental dose-optimization program which includes automated exposure control, adjustment of the mA and/or kV according  to patient size and/or use of iterative reconstruction technique. CONTRAST:  38m OMNIPAQUE IOHEXOL 350 MG/ML SOLN COMPARISON:  CT abdomen and pelvis dated May 11, 2019 FINDINGS: CT CHEST FINDINGS Cardiovascular: Cardiomegaly. Trace pericardial effusion. Normal caliber thoracic aorta with moderate atherosclerotic disease. Moderate three-vessel coronary artery calcifications. Mediastinum/Nodes: Esophagus and thyroid are unremarkable. No pathologically enlarged nodes seen in the chest. Lungs/Pleura: Central airways are patent. Mild septal thickening. Trace right-greater-than-left bilateral pleural effusions and bibasilar atelectasis. New irregular solid pulmonary nodule of the posterior left upper lobe measuring 6 mm on series 4, image 52. Musculoskeletal: No chest wall mass or suspicious bone lesions identified. CT ABDOMEN PELVIS FINDINGS Hepatobiliary: No focal  liver abnormality is seen. Status post cholecystectomy. No biliary dilatation. Pancreas: Unremarkable. No pancreatic ductal dilatation or surrounding inflammatory changes. Spleen: Normal in size without focal abnormality. Adrenals/Urinary Tract: Bilateral adrenal glands are unremarkable. No hydronephrosis. Bilateral nonobstructing renal stones. No suspicious renal lesions. bladder is unremarkable. Stomach/Bowel: Stomach is within normal limits. Diverticulosis. No evidence of bowel wall thickening, distention, or inflammatory changes. Vascular/Lymphatic: Aortic atherosclerosis.  Trace pelvic ascites. Reproductive: Prostatomegaly. Other: No abdominal wall hernia or abnormality. No abdominopelvic ascites. Musculoskeletal: No acute or significant osseous findings. IMPRESSION: 1. Cardiomegaly and mild pulmonary edema. 2. New irregular solid pulmonary nodule of the posterior left upper lobe measuring 6 mm. Non-contrast chest CT at 6-12 months is recommended. If the nodule is stable at time of repeat CT, then future CT at 18-24 months (from today's scan) is considered optional for low-risk patients, but is recommended for high-risk patients. This recommendation follows the consensus statement: Guidelines for Management of Incidental Pulmonary Nodules Detected on CT Images: From the Fleischner Society 2017; Radiology 2017; 284:228-243. 3. No acute findings in the abdomen or pelvis. 4. Aortic Atherosclerosis (ICD10-I70.0). Electronically Signed   By: LYetta GlassmanM.D.   On: 09/02/2022 08:36   DG Orthopantogram  Result Date: 09/01/2022 CLINICAL DATA:  Preoperative evaluation EXAM: ORTHOPANTOGRAM/PANORAMIC COMPARISON:  None Available. FINDINGS: Scattered dental fillings. Small dental caries noted at RIGHT third maxillary molar. No periodontal lucencies identified. IMPRESSION: Small dental caries at RIGHT third maxillary molar. No periodontal disease identified. Electronically Signed   By: MLavonia DanaM.D.   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 Severely reduced cardiac index. Hemodynamic parameters suggestive of preserved RV function. Frequent PVCs and ectopy during case. RECOMMENDATIONS: Admit for expedited LVAD evaluation. Aditya Sabharwal Advanced Heart Failure 5:39 PM      I have independently reviewed the above radiology studies  and reviewed the findings with the patient.   Recent Lab Findings: Lab Results  Component Value Date   WBC 4.7 09/02/2022   HGB 14.0 09/02/2022   HCT 39.8 09/02/2022   PLT 192 09/02/2022   GLUCOSE 122 (H) 09/02/2022   CHOL 134 09/02/2022   TRIG 75 09/02/2022   HDL 34 (L) 09/02/2022   LDLCALC 85 09/02/2022   ALT 24 09/02/2022   AST 16 09/02/2022  NA 139 09/02/2022   K 3.1 (L) 09/02/2022   CL 105 09/02/2022   CREATININE 1.11 09/02/2022   BUN 19 09/02/2022   CO2 24 09/02/2022   TSH 2.329 09/01/2022   INR 1.3 (H) 09/02/2022   HGBA1C 7.4 (H) 09/02/2022     Assessment / Plan:   72M with history of leukemia, T2DM, SVT presents for LVAD evaluation.    Good candidate for LVAD. Socially great support, wife at bedside.   Echo reviewed at bedside - LVIDD 6cm, mild-mod AI May need a park stitch.   EF <25% by echo and is limited on medical therapy by hypotension. Admitted current for expidited workup.  We talked about transplant as well and he was not interested in this for psychosocial reasons. He understood his age could be an issue for transplant listing in addition.    Of note, he is listed as a difficult intubation from a shoulder surgery in 2005-2008.    Risks/benefits/alternatives were discussed at length (80% straightforward recovery, 15% morbidity [any organ, bleeding/stroke/reoperation related to LVAD and anticoagulation, as well as predominantly age-related issues I.e. tissue quality, 5% mortality]. Straightforward recovery typically entails 4-6 ICU days, 7-10 days on the floor, seeing me back in clinic at 1 week and 1 month postoperatively. No lifting greater than 30 lbs for 6 weeks. Total recovery expected by 2 months. All questions were asked and answered.   For now we are looking at Feb 14th operative date.    I  spent 60 minutes with  the patient face to face in counseling and coordination of care.    Pierre Bali Sakura Denis 09/09/2022 4:30 PM

## 2022-09-09 NOTE — Assessment & Plan Note (Addendum)
Quit smoking 1987 s apparent sequelae - PFT's  09/02/22  FEV1 2.28 (74 % ) ratio 0.89  p 5 % improvement from saba p 0 prior to study with DLCO  15.6 (62%)   and FV curve nl  and ERV nl at hgb =14   - 09/09/2022   Walked on RA  x  3  lap(s) =  approx 450  ft  @ moderate pace, stopped due to end of study with lowest 02 sats 93% talking entire time very mild sob all 3   Imp: no significant copd/ ab or ILD:  Comment: When respiratory symptoms begin or become refractory well after a patient reports complete smoking cessation,  especially when this wasn't the case while they were smoking, a red flag is raised based on the work of Dr Kris Mouton which states:  if you quit smoking when your best day FEV1 is still well preserved it is highly unlikely you will progress to severe disease.  That is to say, once the smoking stops,  the symptoms should not suddenly erupt or markedly worsen.  If so, the differential diagnosis should include  obesity/deconditioning,  LPR/Reflux/Aspiration syndromes,  occult CHF, or  especially side effect of medications commonly used in this population.    His coughing spells are likely pnds from rhinitis perhaps accentuated by the effects of entresto on upper airway bradykinins, but they are tolerable for now   His interstitial changes on cxr are likely chf, esp since not dropping sats with ex, though he is at risk of amiodarone toxicity if high doses are needed for long periods Patients typically have been on amiodarone for 6-12 months before this complication manifests.  Of note, serial clinical evaluation for symptoms such as cough dyspnea or fevers is  the preferred method of monitoring for pulmonary toxicity because a decrease in DLCO or lung volumes is a nonspecific for toxicity.    Risk factors for pulmonary toxicity include age greater than 48, daily dose greater than equal to 400 mg, a high cumulative dose, or pre-existing lung disease   Rec Make sure you check your oxygen  saturation at your highest level of activity(NOT after you stop)  to be sure it stays over 90% and keep track of it at least once a week, more often if breathing getting worse, and let me know if losing ground. (Collect the dots to connect the dots approach)    Pulmonary f/u can be prn   Each maintenance medication was reviewed in detail including emphasizing most importantly the difference between maintenance and prns and under what circumstances the prns are to be triggered using an action plan format where appropriate.  Total time for H and P, chart review, counseling,  directly observing portions of ambulatory 02 saturation study/ and generating customized AVS unique to this office visit / same day charting = 48 min

## 2022-09-10 LAB — FACTOR 5 LEIDEN

## 2022-09-11 ENCOUNTER — Other Ambulatory Visit: Payer: Self-pay | Admitting: Cardiology

## 2022-09-11 DIAGNOSIS — J3489 Other specified disorders of nose and nasal sinuses: Secondary | ICD-10-CM | POA: Diagnosis not present

## 2022-09-11 DIAGNOSIS — I700 Atherosclerosis of aorta: Secondary | ICD-10-CM | POA: Diagnosis not present

## 2022-09-11 DIAGNOSIS — J329 Chronic sinusitis, unspecified: Secondary | ICD-10-CM | POA: Diagnosis not present

## 2022-09-11 DIAGNOSIS — I429 Cardiomyopathy, unspecified: Secondary | ICD-10-CM | POA: Diagnosis not present

## 2022-09-11 DIAGNOSIS — Z299 Encounter for prophylactic measures, unspecified: Secondary | ICD-10-CM | POA: Diagnosis not present

## 2022-09-13 ENCOUNTER — Other Ambulatory Visit: Payer: Self-pay | Admitting: Cardiology

## 2022-09-15 ENCOUNTER — Encounter (HOSPITAL_COMMUNITY): Payer: Medicare PPO

## 2022-09-16 ENCOUNTER — Other Ambulatory Visit (HOSPITAL_COMMUNITY): Payer: Self-pay | Admitting: Unknown Physician Specialty

## 2022-09-16 DIAGNOSIS — I5022 Chronic systolic (congestive) heart failure: Secondary | ICD-10-CM

## 2022-09-16 MED ORDER — SODIUM CHLORIDE 0.9% FLUSH: 3.0000 mL | Freq: Two times a day (BID) | INTRAVENOUS | Status: DC

## 2022-09-19 ENCOUNTER — Telehealth (HOSPITAL_COMMUNITY): Payer: Self-pay | Admitting: *Deleted

## 2022-09-19 ENCOUNTER — Ambulatory Visit (HOSPITAL_BASED_OUTPATIENT_CLINIC_OR_DEPARTMENT_OTHER)
Admission: RE | Admit: 2022-09-19 | Discharge: 2022-09-19 | Disposition: A | Discharge status: Home / Self Care | Payer: Medicare PPO | Source: Ambulatory Visit | Attending: Cardiology | Admitting: Cardiology

## 2022-09-19 DIAGNOSIS — Y92239 Unspecified place in hospital as the place of occurrence of the external cause: Secondary | ICD-10-CM | POA: Diagnosis not present

## 2022-09-19 DIAGNOSIS — E1140 Type 2 diabetes mellitus with diabetic neuropathy, unspecified: Secondary | ICD-10-CM | POA: Insufficient documentation

## 2022-09-19 DIAGNOSIS — I255 Ischemic cardiomyopathy: Secondary | ICD-10-CM | POA: Diagnosis present

## 2022-09-19 DIAGNOSIS — Z87442 Personal history of urinary calculi: Secondary | ICD-10-CM | POA: Insufficient documentation

## 2022-09-19 DIAGNOSIS — I2721 Secondary pulmonary arterial hypertension: Secondary | ICD-10-CM | POA: Diagnosis present

## 2022-09-19 DIAGNOSIS — Z87891 Personal history of nicotine dependence: Secondary | ICD-10-CM | POA: Insufficient documentation

## 2022-09-19 DIAGNOSIS — Z8679 Personal history of other diseases of the circulatory system: Secondary | ICD-10-CM | POA: Diagnosis not present

## 2022-09-19 DIAGNOSIS — Z01818 Encounter for other preprocedural examination: Secondary | ICD-10-CM | POA: Diagnosis not present

## 2022-09-19 DIAGNOSIS — I517 Cardiomegaly: Secondary | ICD-10-CM | POA: Diagnosis not present

## 2022-09-19 DIAGNOSIS — K219 Gastro-esophageal reflux disease without esophagitis: Secondary | ICD-10-CM | POA: Diagnosis present

## 2022-09-19 DIAGNOSIS — I428 Other cardiomyopathies: Secondary | ICD-10-CM | POA: Diagnosis present

## 2022-09-19 DIAGNOSIS — Z95811 Presence of heart assist device: Secondary | ICD-10-CM | POA: Diagnosis not present

## 2022-09-19 DIAGNOSIS — E1165 Type 2 diabetes mellitus with hyperglycemia: Secondary | ICD-10-CM | POA: Diagnosis not present

## 2022-09-19 DIAGNOSIS — J811 Chronic pulmonary edema: Secondary | ICD-10-CM | POA: Diagnosis not present

## 2022-09-19 DIAGNOSIS — Z7984 Long term (current) use of oral hypoglycemic drugs: Secondary | ICD-10-CM | POA: Insufficient documentation

## 2022-09-19 DIAGNOSIS — I2510 Atherosclerotic heart disease of native coronary artery without angina pectoris: Secondary | ICD-10-CM | POA: Insufficient documentation

## 2022-09-19 DIAGNOSIS — Z79899 Other long term (current) drug therapy: Secondary | ICD-10-CM | POA: Diagnosis not present

## 2022-09-19 DIAGNOSIS — I4729 Other ventricular tachycardia: Secondary | ICD-10-CM | POA: Diagnosis not present

## 2022-09-19 DIAGNOSIS — E440 Moderate protein-calorie malnutrition: Secondary | ICD-10-CM | POA: Diagnosis not present

## 2022-09-19 DIAGNOSIS — I509 Heart failure, unspecified: Secondary | ICD-10-CM | POA: Diagnosis not present

## 2022-09-19 DIAGNOSIS — I420 Dilated cardiomyopathy: Secondary | ICD-10-CM

## 2022-09-19 DIAGNOSIS — I5020 Unspecified systolic (congestive) heart failure: Secondary | ICD-10-CM

## 2022-09-19 DIAGNOSIS — J90 Pleural effusion, not elsewhere classified: Secondary | ICD-10-CM | POA: Diagnosis not present

## 2022-09-19 DIAGNOSIS — E7800 Pure hypercholesterolemia, unspecified: Secondary | ICD-10-CM | POA: Diagnosis present

## 2022-09-19 DIAGNOSIS — R17 Unspecified jaundice: Secondary | ICD-10-CM | POA: Diagnosis not present

## 2022-09-19 DIAGNOSIS — R570 Cardiogenic shock: Secondary | ICD-10-CM | POA: Diagnosis not present

## 2022-09-19 DIAGNOSIS — I5022 Chronic systolic (congestive) heart failure: Secondary | ICD-10-CM | POA: Diagnosis not present

## 2022-09-19 DIAGNOSIS — N179 Acute kidney failure, unspecified: Secondary | ICD-10-CM | POA: Diagnosis present

## 2022-09-19 DIAGNOSIS — D62 Acute posthemorrhagic anemia: Secondary | ICD-10-CM | POA: Diagnosis not present

## 2022-09-19 DIAGNOSIS — I110 Hypertensive heart disease with heart failure: Secondary | ICD-10-CM | POA: Insufficient documentation

## 2022-09-19 DIAGNOSIS — E876 Hypokalemia: Secondary | ICD-10-CM | POA: Diagnosis not present

## 2022-09-19 DIAGNOSIS — R110 Nausea: Secondary | ICD-10-CM | POA: Diagnosis not present

## 2022-09-19 DIAGNOSIS — K5909 Other constipation: Secondary | ICD-10-CM | POA: Diagnosis present

## 2022-09-19 DIAGNOSIS — C9241 Acute promyelocytic leukemia, in remission: Secondary | ICD-10-CM | POA: Insufficient documentation

## 2022-09-19 DIAGNOSIS — I3139 Other pericardial effusion (noninflammatory): Secondary | ICD-10-CM | POA: Diagnosis present

## 2022-09-19 DIAGNOSIS — I472 Ventricular tachycardia, unspecified: Secondary | ICD-10-CM | POA: Insufficient documentation

## 2022-09-19 DIAGNOSIS — I493 Ventricular premature depolarization: Secondary | ICD-10-CM | POA: Insufficient documentation

## 2022-09-19 DIAGNOSIS — T8111XA Postprocedural  cardiogenic shock, initial encounter: Secondary | ICD-10-CM | POA: Diagnosis not present

## 2022-09-19 DIAGNOSIS — R0600 Dyspnea, unspecified: Secondary | ICD-10-CM | POA: Insufficient documentation

## 2022-09-19 DIAGNOSIS — I351 Nonrheumatic aortic (valve) insufficiency: Secondary | ICD-10-CM | POA: Diagnosis not present

## 2022-09-19 DIAGNOSIS — R410 Disorientation, unspecified: Secondary | ICD-10-CM | POA: Diagnosis not present

## 2022-09-19 DIAGNOSIS — J9601 Acute respiratory failure with hypoxia: Secondary | ICD-10-CM | POA: Diagnosis not present

## 2022-09-19 DIAGNOSIS — J9811 Atelectasis: Secondary | ICD-10-CM | POA: Diagnosis not present

## 2022-09-19 DIAGNOSIS — R0602 Shortness of breath: Secondary | ICD-10-CM | POA: Insufficient documentation

## 2022-09-19 DIAGNOSIS — E871 Hypo-osmolality and hyponatremia: Secondary | ICD-10-CM | POA: Diagnosis present

## 2022-09-19 DIAGNOSIS — D696 Thrombocytopenia, unspecified: Secondary | ICD-10-CM | POA: Diagnosis not present

## 2022-09-19 DIAGNOSIS — E119 Type 2 diabetes mellitus without complications: Secondary | ICD-10-CM | POA: Diagnosis not present

## 2022-09-19 DIAGNOSIS — R29898 Other symptoms and signs involving the musculoskeletal system: Secondary | ICD-10-CM | POA: Diagnosis not present

## 2022-09-19 DIAGNOSIS — I25119 Atherosclerotic heart disease of native coronary artery with unspecified angina pectoris: Secondary | ICD-10-CM | POA: Diagnosis not present

## 2022-09-19 DIAGNOSIS — I5023 Acute on chronic systolic (congestive) heart failure: Secondary | ICD-10-CM | POA: Diagnosis present

## 2022-09-19 DIAGNOSIS — T8119XA Other postprocedural shock, initial encounter: Secondary | ICD-10-CM | POA: Diagnosis not present

## 2022-09-19 DIAGNOSIS — Y838 Other surgical procedures as the cause of abnormal reaction of the patient, or of later complication, without mention of misadventure at the time of the procedure: Secondary | ICD-10-CM | POA: Diagnosis not present

## 2022-09-19 LAB — COMPREHENSIVE METABOLIC PANEL
ALT: 30 U/L (ref 0–44)
AST: 25 U/L (ref 15–41)
Albumin: 3.6 g/dL (ref 3.5–5.0)
Alkaline Phosphatase: 76 U/L (ref 38–126)
Anion gap: 14 (ref 5–15)
BUN: 18 mg/dL (ref 8–23)
CO2: 21 mmol/L — ABNORMAL LOW (ref 22–32)
Calcium: 8.8 mg/dL — ABNORMAL LOW (ref 8.9–10.3)
Chloride: 103 mmol/L (ref 98–111)
Creatinine, Ser: 1.23 mg/dL (ref 0.61–1.24)
GFR, Estimated: >60 mL/min (ref >60)
Glucose, Bld: 144 mg/dL — ABNORMAL HIGH (ref 70–99)
Potassium: 4 mmol/L (ref 3.5–5.1)
Sodium: 138 mmol/L (ref 135–145)
Total Bilirubin: 1.2 mg/dL (ref 0.3–1.2)
Total Protein: 7.1 g/dL (ref 6.5–8.1)

## 2022-09-19 LAB — CBC
HCT: 40.1 % (ref 39.0–52.0)
Hemoglobin: 14.1 g/dL (ref 13.0–17.0)
MCH: 31.1 pg (ref 26.0–34.0)
MCHC: 35.2 g/dL (ref 30.0–36.0)
MCV: 88.5 fL (ref 80.0–100.0)
Platelets: 223 10*3/uL (ref 150–400)
RBC: 4.53 MIL/uL (ref 4.22–5.81)
RDW: 15.9 % — ABNORMAL HIGH (ref 11.5–15.5)
WBC: 5.8 10*3/uL (ref 4.0–10.5)
nRBC: 0 % (ref 0.0–0.2)

## 2022-09-19 LAB — BRAIN NATRIURETIC PEPTIDE: B Natriuretic Peptide: 1954.3 pg/mL — ABNORMAL HIGH (ref 0.0–100.0)

## 2022-09-19 MED ORDER — SPIRONOLACTONE 25 MG PO TABS: 25.0000 mg | ORAL_TABLET | Freq: Every day | ORAL | 3 refills | Status: DC

## 2022-09-19 MED ORDER — POTASSIUM CHLORIDE CRYS ER 20 MEQ PO TBCR: 40.0000 meq | EXTENDED_RELEASE_TABLET | Freq: Two times a day (BID) | ORAL | Status: DC

## 2022-09-19 MED ORDER — FUROSEMIDE 10 MG/ML IJ SOLN
80.0000 mg | Freq: Once | INTRAMUSCULAR | Status: AC
  Administered 2022-09-19: 80 mg via INTRAVENOUS

## 2022-09-19 MED ORDER — POTASSIUM CHLORIDE CRYS ER 20 MEQ PO TBCR
40.0000 meq | EXTENDED_RELEASE_TABLET | Freq: Once | ORAL | Status: AC
  Administered 2022-09-19: 40 meq via ORAL

## 2022-09-19 MED ORDER — POTASSIUM CHLORIDE CRYS ER 20 MEQ PO TBCR: 40.0000 meq | EXTENDED_RELEASE_TABLET | Freq: Every day | ORAL | 3 refills | Status: DC

## 2022-09-19 NOTE — Progress Notes (Signed)
Patient presents for sick visit in [facility] today with his wife. Recent hospitalization with initiation of VAD evaluation.    Pt called the VAD office today with c/o of SOB and "fluid gathering." Pt was visibly SOB walking to clinic from the waiting room.   Pt is scheduled for RHC on Monday with VAD implant posted for Wednesday next week.   20g PIV started in pts left FA. 80 mg IV lasix given along with 40 mEq of Potassium.  IV removed and gauze with tape applied.   Vital Signs:  HR:  90 BP: 119/70 (93) SPO2: 97 %   Weight: 200 lb w/ life vest Last weight: 191 lb in hosp w/o life vest    Symptom YES NO DETAILS  Angina  x Activity:  Claudication  x How Far:  Syncope  x When:  Stroke  x   Orthopnea x  How many pillows:  PND x  How often:  CPAP  x How many hours:  Pedal Edema x    Abdominal Fullness x    Nausea / Vomit  x   Diaphoresis  x When:  Shortness of Breath x  Activity: walking short distances  Palpitations x  When:  ICD shock  x   Bleeding S/S  x   Tea-colored Urine  x   Hospitalizations  x   Emergency Room  x   Other MD  x   Activity   Fluid   Diet       Device: none Currently wearing life vest   Patient Instructions:    Please take 1 cartridge of Furoscix Saturday and Sunday STOP home Lasix Start Potassium 40 mEq daily. Take a dose this evening. Increase Spironolactone to 25 mg daily Please page VAD pager over the weekend if your condition worsens. Return to admitting Monday morning at 0800 for RHC at 1000. Nothing to eat or drink after midnight Sunday.   Tanda Rockers, RN VAD Coordinator    Office: [phone] 24/7 Emergency VAD Pager: [phone]

## 2022-09-19 NOTE — Addendum Note (Signed)
Encounter addended by: Christinia Gully, RN on: 09/19/2022 2:21 PM  Actions taken: Clinical Note Signed

## 2022-09-19 NOTE — Progress Notes (Signed)
ADVANCED HEART FAILURE CLINIC NOTE  Referring Physician: Glenda Chroman, MD  Primary Care: Glenda Chroman, MD Primary Cardiologist: Charletta Cousin Heart Failure: Hebert Soho  HPI: Ricardo Zuniga is a very pleasant 72 YO WM w/ HFrEF 2/2 nonischemic cardiomyopathy, T2DM and hx of acute promyelocytic leukemia s/p ATRA ([facility] 2014) that was discharged from [facility] in October 2023 after presenting with a 1 week history of orthopnea, PND & LE edema. He had a TTE prior to admission w/ LVEF of 40-45%. Echo during admit was notable for LVEF of 30%-35%. LHC/RHC during admit w/ nonobstructive CAD & moderately reduced CI (2.1 L/min/m2). He was ultimately discharged home on low dose GDMT.   Prior to this admit, his cardiac history dates back to 2015 when he was found to have an LVEF of 25% felt to be triggered by endocarditis.   Echo was done in 1/24 with EF 25%, severe LV dilation, global hypokinesis, moderate RV dysfunction, mild RV enlargement, moderate AI, mild-moderate MR.  RHC in 1/24 showed elevated right and left heart filling pressures, PVR 6.22 WU with mixed pulmonary venous/pulmonary arterial hypertension, CI low at 2.85.  Plan made for LVAD due to rapid deterioration, set up for Monday admission for RHC and Wednesday LVAD placement.   Patient has noted increased dyspnea for about 3 days.  He is short of breath walking across his house or walking up stairs.  Mild orthopnea.  No chest pain.  No lightheadedness.  He has PVCs noted when hooked up to telemetry but no palpitations.  He is wearing his Lifevest.    Labs (1/24): K 3.1, creatinine 1.11, LFTs normal  Past Medical History:  Diagnosis Date   Anal fissure    APML (acute promyelocytic leukemia) in remission ([place])    Completed treatment 04/2013   CHF (congestive heart failure) (H[facility])    Coronary artery disease    Nonobstructive at cardiac catheterization 09/2018   Difficult intubation    w/shoulder surgery at surgical  center between 2005-2008   Gastroesophageal reflux disease    History of blood transfusion 2014   History of kidney stones    passed stones   Hypercholesteremia    Neuropathy    bilateral feet   Nonischemic cardiomyopathy ([place])    a. EF 20% in 2015 - thought to possibly be viral induced or chemotherapy induced from treatments in 2014 b. at 35-40% by echo in 05/2016    Peptic ulcer disease    Pneumonia    Yrs ago   Type 2 diabetes mellitus ([place])    Ureteral colic    Vertigo    tx with valium prn   Wears glasses    Wears hearing aid in both ears     Current Outpatient Medications  Medication Sig Dispense Refill   acetaminophen (TYLENOL) 650 MG CR tablet Take 650-1,300 mg by mouth every 8 (eight) hours as needed for pain.     albuterol (VENTOLIN HFA) 108 (90 Base) MCG/ACT inhaler Inhale 2 puffs into the lungs every 6 (six) hours as needed for wheezing or shortness of breath. 8 g 2   ALPRAZolam (XANAX) 0.25 MG tablet Take 0.25 mg by mouth daily at 6 PM.     amiodarone (PACERONE) 200 MG tablet Take 1 tablet (200 mg total) by mouth 2 (two) times daily. 180 tablet 3   aspirin EC 81 MG tablet Take 81 mg by mouth in the morning.     atorvastatin (LIPITOR) 40 MG tablet Take 1  tablet (40 mg total) by mouth daily. 90 tablet 1   Cholecalciferol (VITAMIN D3) 125 MCG (5000 UT) TABS Take 5,000 Units by mouth in the morning.     diazepam (VALIUM) 5 MG tablet Take 5 mg by mouth daily as needed (vertigo).     Digoxin 62.5 MCG TABS Take 1 tablet by mouth daily. 30 tablet 5   docusate sodium (COLACE) 100 MG capsule Take 200 mg by mouth every evening.     empagliflozin (JARDIANCE) 10 MG TABS tablet Take 10 mg by mouth every evening.     furosemide (LASIX) 20 MG tablet Take 40 mg by mouth 2 (two) times daily.     guaiFENesin (MUCINEX) 600 MG 12 hr tablet Take 600 mg by mouth daily as needed (congestion.).     Multiple Vitamin (MULTIVITAMIN WITH MINERALS) TABS tablet Take 1 tablet by mouth daily. 30  tablet 5   omeprazole (PRILOSEC) 20 MG capsule Take 20 mg by mouth daily before breakfast.     ondansetron (ZOFRAN) 4 MG tablet Take 1 tablet (4 mg total) by mouth every 6 (six) hours as needed for nausea. 20 tablet 0   repaglinide (PRANDIN) 2 MG tablet Take 2-4 mg by mouth See admin instructions. Take 2 tablets (4 mg) by mouth in the morning & take 1 tablet (2 mg) by mouth in the evening.     sacubitril-valsartan (ENTRESTO) 24-26 MG Take 1 tablet by mouth twice daily 180 tablet 1   spironolactone (ALDACTONE) 25 MG tablet Take 0.5 tablets (12.5 mg total) by mouth daily. 45 tablet 3   traZODone (DESYREL) 50 MG tablet Take 1 tablet (50 mg total) by mouth at bedtime as needed for sleep. 30 tablet 1   zinc gluconate 50 MG tablet Take 50 mg by mouth in the morning.     No current facility-administered medications for this encounter.    Allergies  Allergen Reactions   Zestril [Lisinopril] Cough      Social History   Socioeconomic History   Marital status: Married    Spouse name: Not on file   Number of children: Not on file   Years of education: Not on file   Highest education level: Not on file  Occupational History   Occupation: RETIRED    Comment: [employer]   Occupation: SECURITY GUARD  Tobacco Use   Smoking status: Former    Packs/day: 2.00    Years: 17.00    Total pack years: 34.00    Types: Cigarettes    Start date: 02/17/1968    Quit date: 02/28/1986    Years since quitting: 36.5   Smokeless tobacco: Never  Vaping Use   Vaping Use: Never used  Substance and Sexual Activity   Alcohol use: No    Alcohol/week: 0.0 standard drinks of alcohol   Drug use: No   Sexual activity: Not Currently  Other Topics Concern   Not on file  Social History Narrative   Not on file   Social Determinants of Health   Financial Resource Strain: Low Risk  (09/03/2022)   Overall Financial Resource Strain (CARDIA)    Difficulty of Paying Living Expenses: Not hard at all  Food  Insecurity: No Food Insecurity (09/01/2022)   Hunger Vital Sign    Worried About Running Out of Food in the Last Year: Never true    [place] in the Last Year: Never true  Transportation Needs: No Transportation Needs (09/01/2022)   Uncertain - Transportation  Lack of Transportation (Medical): No    Lack of Transportation (Non-Medical): No  Physical Activity: Not on file  Stress: Not on file  Social Connections: Not on file  Intimate Partner Violence: Not At Risk (09/01/2022)   Humiliation, Afraid, Rape, and Kick questionnaire    Fear of Current or Ex-Partner: No    Emotionally Abused: No    Physically Abused: No    Sexually Abused: No      Family History  Problem Relation Age of Onset   Colon cancer Other     PHYSICAL EXAM: BP 127/87, HR 84, oxygen saturation 93% on room air  General: NAD Neck: JVP 14 cm, no thyromegaly or thyroid nodule.  Lungs: Clear to auscultation bilaterally with normal respiratory effort. CV: Nondisplaced PMI.  Heart S1/S2 with PVCs, no S3/S4, no murmur.  2+ edema 3/4 to knees.  No carotid bruit.  Normal pedal pulses.  Abdomen: Soft, nontender, no hepatosplenomegaly, mild distention.  Skin: Intact without lesions or rashes.  Neurologic: Alert and oriented x 3.  Psych: Normal affect. Extremities: No clubbing or cyanosis.  HEENT: Normal.   ECHO: - Echo (06/08/22): EF 30-35%, global HK, RV mildly reduced, mild MR   - Echo (7/23): EF 45%, RV normal   - Echo (8/22): EF 40-45%, RV normal   - Echo (7/21): EF 50%, RV normal  - LHC (09/2018): Moderate nonobstructive one-vessel coronary artery disease, prox RCA 40% stenosed.   - Echo (08/2018): EF 40%, RV normal  - Echo (05/2016): EF 35-40%, RV normal   - Echo (05/2014): EF 25-30%, RV mildly reduced  - Echo (05/2012): EF 50%, RV normal  - Echo (1/24): EF 25%, severe LV dilation, global hypokinesis, moderate RV dysfunction, mild RV enlargement, moderate AI, mild-moderate MR.  CATH: R/LHC (10/23):  nonobstructive CAD RA 8 PA 52/25 (34 mean) PCWP 14 (mean) CO/CI (Fick): 4.3/2.1                                 PVR 4.6 WU          PAPi 3.4             RHC (1/24): RA mean 10 PA 69/28, mean 43 PCWP mean 20 CI 1.85 PVR 6.22 WU PAPi 4.1     ASSESSMENT & PLAN:  1. Chronic systolic CHF: Nonischemic cardiomyopathy.  Echo in 1/24 showed EF 25%, severe LV dilation, global hypokinesis, moderate RV dysfunction, mild RV enlargement, moderate AI, mild-moderate MR. RHC in 1/24 with elevated filling pressures, low cardiac output, mixed pulmonary venous/arterial hypertension.  Given rapidly progressive heart failure, plan for LVAD placement next week.  He is volume overloaded today with NYHA class III symptoms.  Oxygen saturation and BP are ok.   - Lasix 80 mg IV x 1 in the office today.  - Lasix 80 mg [place] (Furoscix) daily on Saturday and Sunday.  - Will be admitted Monday for [place] pre-LVAD.  - He will take KCl 40 mEq bid today and 40 daily starting tomorrow.  - Increase spironolactone to 25 mg daily.  - Continue Entresto 24/26 bid - Continue Jardiance 10 mg daily.  - Continue digoxin 0.0625 daily, check level. - CMET/BNP today.  2. NSVT: PVCs noted on telemetry in office today.   - Continue amiodarone 224m BID - Has Lifevest.  3. [facility]  4. Hx of endocarditis: 2015  He will call if  symptoms worsen over the weekend and we can admit.  Otherwise, will admit Monday for [place] then LVAD Wednesday.   Loralie Champagne 09/19/2022 1:46 PM

## 2022-09-19 NOTE — H&P (View-Only) (Signed)
error 

## 2022-09-19 NOTE — Telephone Encounter (Signed)
Received call from patient's wife reporting 10 lb weight gain, increasing shortness of breath, and abdominal/pedal edema over the last few days. Currently taking Lasix 40 mg BID. She states he is stressed about upcoming surgery and has not been sleeping well.   Discussed with Dr Daniel Nones. Will bring pt to clinic this afternoon for further assessment as pt is scheduled for RHC on Monday with admission to follow for VAD implant 09/24/22.   Spoke with pt's wife regarding the above. Will plan to see pt in VAD clinic at 1:00 pm today. She verbalized understanding.   Emerson Monte RN [place] Coordinator  Office: [phone]  24/7 Pager: [phone]

## 2022-09-19 NOTE — Progress Notes (Signed)
error 

## 2022-09-19 NOTE — Patient Instructions (Addendum)
Please take 1 cartridge of Furoscix Saturday and Sunday STOP home Lasix Start Potassium 40 mEq daily. Take a dose this evening. Increase Spironolactone to 25 mg daily Please page VAD pager over the weekend if your condition worsens.

## 2022-09-22 ENCOUNTER — Other Ambulatory Visit: Payer: Self-pay

## 2022-09-22 ENCOUNTER — Encounter (HOSPITAL_COMMUNITY)
Admission: AD | Disposition: A | Discharge status: Home Health | Payer: Self-pay | Source: Home / Self Care | Attending: Cardiothoracic Surgery

## 2022-09-22 ENCOUNTER — Inpatient Hospital Stay (HOSPITAL_COMMUNITY)
Admission: AD | Admit: 2022-09-22 | Discharge: 2022-10-07 | DRG: 001 | Disposition: A | Discharge status: Home Health | Payer: Medicare PPO | Attending: Cardiothoracic Surgery | Admitting: Cardiothoracic Surgery

## 2022-09-22 DIAGNOSIS — R17 Unspecified jaundice: Secondary | ICD-10-CM | POA: Diagnosis not present

## 2022-09-22 DIAGNOSIS — Y838 Other surgical procedures as the cause of abnormal reaction of the patient, or of later complication, without mention of misadventure at the time of the procedure: Secondary | ICD-10-CM | POA: Diagnosis not present

## 2022-09-22 DIAGNOSIS — I428 Other cardiomyopathies: Secondary | ICD-10-CM | POA: Diagnosis present

## 2022-09-22 DIAGNOSIS — J95821 Acute postprocedural respiratory failure: Secondary | ICD-10-CM | POA: Diagnosis not present

## 2022-09-22 DIAGNOSIS — I110 Hypertensive heart disease with heart failure: Secondary | ICD-10-CM | POA: Diagnosis present

## 2022-09-22 DIAGNOSIS — I255 Ischemic cardiomyopathy: Secondary | ICD-10-CM | POA: Diagnosis present

## 2022-09-22 DIAGNOSIS — R791 Abnormal coagulation profile: Secondary | ICD-10-CM | POA: Diagnosis present

## 2022-09-22 DIAGNOSIS — I3139 Other pericardial effusion (noninflammatory): Secondary | ICD-10-CM | POA: Diagnosis present

## 2022-09-22 DIAGNOSIS — K219 Gastro-esophageal reflux disease without esophagitis: Secondary | ICD-10-CM | POA: Diagnosis present

## 2022-09-22 DIAGNOSIS — D62 Acute posthemorrhagic anemia: Secondary | ICD-10-CM | POA: Diagnosis not present

## 2022-09-22 DIAGNOSIS — C9241 Acute promyelocytic leukemia, in remission: Secondary | ICD-10-CM | POA: Diagnosis present

## 2022-09-22 DIAGNOSIS — R630 Anorexia: Secondary | ICD-10-CM | POA: Diagnosis not present

## 2022-09-22 DIAGNOSIS — T8119XA Other postprocedural shock, initial encounter: Secondary | ICD-10-CM | POA: Diagnosis not present

## 2022-09-22 DIAGNOSIS — Z6828 Body mass index (BMI) 28.0-28.9, adult: Secondary | ICD-10-CM

## 2022-09-22 DIAGNOSIS — I472 Ventricular tachycardia, unspecified: Secondary | ICD-10-CM | POA: Diagnosis present

## 2022-09-22 DIAGNOSIS — J9601 Acute respiratory failure with hypoxia: Secondary | ICD-10-CM | POA: Diagnosis not present

## 2022-09-22 DIAGNOSIS — Z8679 Personal history of other diseases of the circulatory system: Secondary | ICD-10-CM | POA: Diagnosis not present

## 2022-09-22 DIAGNOSIS — I25119 Atherosclerotic heart disease of native coronary artery with unspecified angina pectoris: Secondary | ICD-10-CM | POA: Diagnosis not present

## 2022-09-22 DIAGNOSIS — R29898 Other symptoms and signs involving the musculoskeletal system: Secondary | ICD-10-CM | POA: Diagnosis not present

## 2022-09-22 DIAGNOSIS — E1165 Type 2 diabetes mellitus with hyperglycemia: Secondary | ICD-10-CM | POA: Diagnosis not present

## 2022-09-22 DIAGNOSIS — D696 Thrombocytopenia, unspecified: Secondary | ICD-10-CM | POA: Diagnosis not present

## 2022-09-22 DIAGNOSIS — I5022 Chronic systolic (congestive) heart failure: Secondary | ICD-10-CM

## 2022-09-22 DIAGNOSIS — I493 Ventricular premature depolarization: Secondary | ICD-10-CM | POA: Diagnosis present

## 2022-09-22 DIAGNOSIS — G629 Polyneuropathy, unspecified: Secondary | ICD-10-CM | POA: Diagnosis present

## 2022-09-22 DIAGNOSIS — I5023 Acute on chronic systolic (congestive) heart failure: Secondary | ICD-10-CM | POA: Diagnosis present

## 2022-09-22 DIAGNOSIS — N179 Acute kidney failure, unspecified: Secondary | ICD-10-CM | POA: Diagnosis present

## 2022-09-22 DIAGNOSIS — Z7901 Long term (current) use of anticoagulants: Secondary | ICD-10-CM

## 2022-09-22 DIAGNOSIS — R570 Cardiogenic shock: Secondary | ICD-10-CM | POA: Diagnosis not present

## 2022-09-22 DIAGNOSIS — E876 Hypokalemia: Secondary | ICD-10-CM | POA: Diagnosis not present

## 2022-09-22 DIAGNOSIS — Z95811 Presence of heart assist device: Secondary | ICD-10-CM

## 2022-09-22 DIAGNOSIS — E871 Hypo-osmolality and hyponatremia: Secondary | ICD-10-CM | POA: Diagnosis present

## 2022-09-22 DIAGNOSIS — Y92239 Unspecified place in hospital as the place of occurrence of the external cause: Secondary | ICD-10-CM | POA: Diagnosis not present

## 2022-09-22 DIAGNOSIS — Z7984 Long term (current) use of oral hypoglycemic drugs: Secondary | ICD-10-CM

## 2022-09-22 DIAGNOSIS — Z981 Arthrodesis status: Secondary | ICD-10-CM

## 2022-09-22 DIAGNOSIS — I509 Heart failure, unspecified: Secondary | ICD-10-CM | POA: Diagnosis not present

## 2022-09-22 DIAGNOSIS — E7800 Pure hypercholesterolemia, unspecified: Secondary | ICD-10-CM | POA: Diagnosis present

## 2022-09-22 DIAGNOSIS — Z974 Presence of external hearing-aid: Secondary | ICD-10-CM

## 2022-09-22 DIAGNOSIS — T8111XA Postprocedural  cardiogenic shock, initial encounter: Secondary | ICD-10-CM | POA: Diagnosis not present

## 2022-09-22 DIAGNOSIS — I2510 Atherosclerotic heart disease of native coronary artery without angina pectoris: Secondary | ICD-10-CM | POA: Diagnosis present

## 2022-09-22 DIAGNOSIS — R110 Nausea: Secondary | ICD-10-CM | POA: Diagnosis not present

## 2022-09-22 DIAGNOSIS — I2721 Secondary pulmonary arterial hypertension: Secondary | ICD-10-CM | POA: Diagnosis present

## 2022-09-22 DIAGNOSIS — Z79899 Other long term (current) drug therapy: Secondary | ICD-10-CM | POA: Diagnosis not present

## 2022-09-22 DIAGNOSIS — R509 Fever, unspecified: Secondary | ICD-10-CM | POA: Diagnosis not present

## 2022-09-22 DIAGNOSIS — Z87891 Personal history of nicotine dependence: Secondary | ICD-10-CM | POA: Diagnosis not present

## 2022-09-22 DIAGNOSIS — Z9842 Cataract extraction status, left eye: Secondary | ICD-10-CM

## 2022-09-22 DIAGNOSIS — K5909 Other constipation: Secondary | ICD-10-CM | POA: Diagnosis present

## 2022-09-22 DIAGNOSIS — E440 Moderate protein-calorie malnutrition: Secondary | ICD-10-CM | POA: Diagnosis not present

## 2022-09-22 DIAGNOSIS — R410 Disorientation, unspecified: Secondary | ICD-10-CM | POA: Diagnosis not present

## 2022-09-22 DIAGNOSIS — Z9841 Cataract extraction status, right eye: Secondary | ICD-10-CM

## 2022-09-22 DIAGNOSIS — Z9221 Personal history of antineoplastic chemotherapy: Secondary | ICD-10-CM

## 2022-09-22 DIAGNOSIS — I351 Nonrheumatic aortic (valve) insufficiency: Secondary | ICD-10-CM | POA: Diagnosis not present

## 2022-09-22 HISTORY — PX: RIGHT HEART CATH: CATH118263

## 2022-09-22 LAB — POCT I-STAT EG7
Acid-Base Excess: 2 mmol/L (ref 0.0–2.0)
Acid-Base Excess: 2 mmol/L (ref 0.0–2.0)
Bicarbonate: 26.5 mmol/L (ref 20.0–28.0)
Bicarbonate: 26.8 mmol/L (ref 20.0–28.0)
Calcium, Ion: 1.24 mmol/L (ref 1.15–1.40)
Calcium, Ion: 1.25 mmol/L (ref 1.15–1.40)
HCT: 41 % (ref 39.0–52.0)
HCT: 41 % (ref 39.0–52.0)
Hemoglobin: 13.9 g/dL (ref 13.0–17.0)
Hemoglobin: 13.9 g/dL (ref 13.0–17.0)
O2 Saturation: 62 %
O2 Saturation: 63 %
Potassium: 4 mmol/L (ref 3.5–5.1)
Potassium: 4 mmol/L (ref 3.5–5.1)
Sodium: 142 mmol/L (ref 135–145)
Sodium: 142 mmol/L (ref 135–145)
TCO2: 28 mmol/L (ref 22–32)
TCO2: 28 mmol/L (ref 22–32)
pCO2, Ven: 39.8 mmHg — ABNORMAL LOW (ref 44–60)
pCO2, Ven: 39.8 mmHg — ABNORMAL LOW (ref 44–60)
pH, Ven: 7.432 — ABNORMAL HIGH (ref 7.25–7.43)
pH, Ven: 7.436 — ABNORMAL HIGH (ref 7.25–7.43)
pO2, Ven: 31 mmHg — CL (ref 32–45)
pO2, Ven: 32 mmHg (ref 32–45)

## 2022-09-22 LAB — CBC
HCT: 43.1 % (ref 39.0–52.0)
Hemoglobin: 14.4 g/dL (ref 13.0–17.0)
MCH: 30.7 pg (ref 26.0–34.0)
MCHC: 33.4 g/dL (ref 30.0–36.0)
MCV: 91.9 fL (ref 80.0–100.0)
Platelets: 226 K/uL (ref 150–400)
RBC: 4.69 MIL/uL (ref 4.22–5.81)
RDW: 16.3 % — ABNORMAL HIGH (ref 11.5–15.5)
WBC: 6.5 K/uL (ref 4.0–10.5)
nRBC: 0 % (ref 0.0–0.2)

## 2022-09-22 LAB — URINALYSIS, ROUTINE W REFLEX MICROSCOPIC
Bacteria, UA: NONE SEEN
Bilirubin Urine: NEGATIVE
Glucose, UA: >=500 mg/dL — AB
Hgb urine dipstick: NEGATIVE
Ketones, ur: NEGATIVE mg/dL
Leukocytes,Ua: NEGATIVE
Nitrite: NEGATIVE
Protein, ur: NEGATIVE mg/dL
Specific Gravity, Urine: 1.015 (ref 1.005–1.030)
pH: 6 (ref 5.0–8.0)

## 2022-09-22 LAB — COOXEMETRY PANEL
Carboxyhemoglobin: 1.9 % — ABNORMAL HIGH (ref 0.5–1.5)
Methemoglobin: 0.9 % (ref 0.0–1.5)
O2 Saturation: 55.6 %
Total hemoglobin: 15.3 g/dL (ref 12.0–16.0)

## 2022-09-22 LAB — GLUCOSE, CAPILLARY
Glucose-Capillary: 160 mg/dL — ABNORMAL HIGH (ref 70–99)
Glucose-Capillary: 187 mg/dL — ABNORMAL HIGH (ref 70–99)
Glucose-Capillary: 192 mg/dL — ABNORMAL HIGH (ref 70–99)
Glucose-Capillary: 239 mg/dL — ABNORMAL HIGH (ref 70–99)

## 2022-09-22 LAB — CREATININE, SERUM
Creatinine, Ser: 1.18 mg/dL (ref 0.61–1.24)
GFR, Estimated: >60 mL/min

## 2022-09-22 LAB — SURGICAL PCR SCREEN
MRSA, PCR: NEGATIVE
Staphylococcus aureus: NEGATIVE

## 2022-09-22 SURGERY — RIGHT HEART CATH
Anesthesia: LOCAL

## 2022-09-22 MED ORDER — ATORVASTATIN CALCIUM 40 MG PO TABS
40.0000 mg | ORAL_TABLET | Freq: Every day | ORAL | Status: DC
  Administered 2022-09-23: 40 mg via ORAL
  Filled 2022-09-22: qty 1

## 2022-09-22 MED ORDER — PANTOPRAZOLE SODIUM 40 MG PO TBEC
40.0000 mg | DELAYED_RELEASE_TABLET | Freq: Every day | ORAL | Status: DC
  Administered 2022-09-23: 40 mg via ORAL
  Filled 2022-09-22: qty 1

## 2022-09-22 MED ORDER — MILRINONE LACTATE IN DEXTROSE 20-5 MG/100ML-% IV SOLN
0.1250 ug/kg/min | INTRAVENOUS | Status: DC
  Administered 2022-09-22: 0.125 ug/kg/min via INTRAVENOUS
  Administered 2022-09-23 – 2022-09-24 (×2): 0.25 ug/kg/min via INTRAVENOUS
  Administered 2022-09-25: 0.125 ug/kg/min via INTRAVENOUS
  Administered 2022-09-26 – 2022-09-29 (×5): 0.25 ug/kg/min via INTRAVENOUS
  Administered 2022-09-30: 0.125 ug/kg/min via INTRAVENOUS
  Filled 2022-09-22 (×12): qty 100

## 2022-09-22 MED ORDER — ASPIRIN 81 MG PO TBEC
81.0000 mg | DELAYED_RELEASE_TABLET | Freq: Every day | ORAL | Status: DC
  Administered 2022-09-23: 81 mg via ORAL
  Filled 2022-09-22: qty 1

## 2022-09-22 MED ORDER — ALBUTEROL SULFATE (2.5 MG/3ML) 0.083% IN NEBU
3.0000 mL | INHALATION_SOLUTION | Freq: Four times a day (QID) | RESPIRATORY_TRACT | Status: DC | PRN
  Filled 2022-09-22: qty 3

## 2022-09-22 MED ORDER — REPAGLINIDE 1 MG PO TABS
2.0000 mg | ORAL_TABLET | Freq: Every day | ORAL | Status: DC
  Administered 2022-09-22: 2 mg via ORAL
  Filled 2022-09-22 (×2): qty 1
  Filled 2022-09-22 (×2): qty 2

## 2022-09-22 MED ORDER — TRAZODONE HCL 50 MG PO TABS
50.0000 mg | ORAL_TABLET | Freq: Every evening | ORAL | Status: DC | PRN
  Administered 2022-09-22 – 2022-10-02 (×7): 50 mg via ORAL
  Filled 2022-09-22 (×8): qty 1

## 2022-09-22 MED ORDER — SODIUM CHLORIDE 0.9% FLUSH
3.0000 mL | INTRAVENOUS | Status: DC | PRN
  Administered 2022-09-22: 3 mL via INTRAVENOUS

## 2022-09-22 MED ORDER — SODIUM CHLORIDE 0.9% FLUSH: 3.0000 mL | INTRAVENOUS | Status: DC | PRN

## 2022-09-22 MED ORDER — SODIUM CHLORIDE 0.9 % IV SOLN: INTRAVENOUS | Status: DC

## 2022-09-22 MED ORDER — LIDOCAINE HCL (PF) 1 % IJ SOLN
INTRAMUSCULAR | Status: DC | PRN
  Administered 2022-09-22: 15 mL

## 2022-09-22 MED ORDER — SODIUM CHLORIDE 0.9 % IV SOLN: 250.0000 mL | INTRAVENOUS | Status: DC | PRN

## 2022-09-22 MED ORDER — REPAGLINIDE 1 MG PO TABS
4.0000 mg | ORAL_TABLET | Freq: Every day | ORAL | Status: DC
  Filled 2022-09-22: qty 4
  Filled 2022-09-22: qty 2

## 2022-09-22 MED ORDER — DOCUSATE SODIUM 100 MG PO CAPS
200.0000 mg | ORAL_CAPSULE | Freq: Every evening | ORAL | Status: DC
  Administered 2022-09-22: 200 mg via ORAL
  Filled 2022-09-22: qty 2

## 2022-09-22 MED ORDER — FUROSEMIDE 10 MG/ML IJ SOLN
INTRAMUSCULAR | Status: AC
  Filled 2022-09-22: qty 4

## 2022-09-22 MED ORDER — INSULIN ASPART 100 UNIT/ML IJ SOLN
0.0000 [IU] | Freq: Every day | INTRAMUSCULAR | Status: DC
  Administered 2022-09-23: 2 [IU] via SUBCUTANEOUS

## 2022-09-22 MED ORDER — HEPARIN (PORCINE) IN NACL 1000-0.9 UT/500ML-% IV SOLN
INTRAVENOUS | Status: DC | PRN
  Administered 2022-09-22: 500 mL

## 2022-09-22 MED ORDER — LIDOCAINE HCL (PF) 1 % IJ SOLN
INTRAMUSCULAR | Status: AC
  Filled 2022-09-22: qty 30

## 2022-09-22 MED ORDER — AMIODARONE HCL 200 MG PO TABS
200.0000 mg | ORAL_TABLET | Freq: Two times a day (BID) | ORAL | Status: DC
  Administered 2022-09-22 – 2022-09-23 (×3): 200 mg via ORAL
  Filled 2022-09-22 (×3): qty 1

## 2022-09-22 MED ORDER — SODIUM CHLORIDE 0.9% FLUSH
3.0000 mL | Freq: Two times a day (BID) | INTRAVENOUS | Status: DC
  Administered 2022-09-22 – 2022-09-23 (×3): 3 mL via INTRAVENOUS

## 2022-09-22 MED ORDER — CHLORHEXIDINE GLUCONATE CLOTH 2 % EX PADS
6.0000 | MEDICATED_PAD | Freq: Every day | CUTANEOUS | Status: DC
  Administered 2022-09-22 – 2022-09-23 (×2): 6 via TOPICAL

## 2022-09-22 MED ORDER — HEPARIN SODIUM (PORCINE) 5000 UNIT/ML IJ SOLN
5000.0000 [IU] | Freq: Three times a day (TID) | INTRAMUSCULAR | Status: DC
  Administered 2022-09-22 – 2022-09-23 (×4): 5000 [IU] via SUBCUTANEOUS
  Filled 2022-09-22 (×5): qty 1

## 2022-09-22 MED ORDER — FUROSEMIDE 10 MG/ML IJ SOLN
40.0000 mg | Freq: Once | INTRAMUSCULAR | Status: AC
  Administered 2022-09-22: 40 mg via INTRAVENOUS

## 2022-09-22 MED ORDER — SPIRONOLACTONE 25 MG PO TABS
25.0000 mg | ORAL_TABLET | Freq: Every day | ORAL | Status: DC
  Administered 2022-09-22 – 2022-09-23 (×2): 25 mg via ORAL
  Filled 2022-09-22 (×2): qty 1

## 2022-09-22 MED ORDER — HEPARIN (PORCINE) IN NACL 1000-0.9 UT/500ML-% IV SOLN
INTRAVENOUS | Status: AC
  Filled 2022-09-22: qty 500

## 2022-09-22 MED ORDER — DIGOXIN 0.0625 MG HALF TABLET
0.0625 mg | ORAL_TABLET | Freq: Every day | ORAL | Status: DC
  Administered 2022-09-23: 0.0625 mg via ORAL
  Filled 2022-09-22 (×3): qty 1

## 2022-09-22 MED ORDER — ACETAMINOPHEN 500 MG PO TABS
500.0000 mg | ORAL_TABLET | Freq: Three times a day (TID) | ORAL | Status: DC | PRN
  Administered 2022-10-06: 1000 mg via ORAL
  Filled 2022-09-22: qty 2

## 2022-09-22 MED ORDER — ALPRAZOLAM 0.25 MG PO TABS
0.2500 mg | ORAL_TABLET | Freq: Every day | ORAL | Status: DC
  Administered 2022-09-22 – 2022-09-23 (×2): 0.25 mg via ORAL
  Filled 2022-09-22 (×2): qty 1

## 2022-09-22 SURGICAL SUPPLY — 8 items
CATH SWAN GANZ VIP 7.5F (CATHETERS) IMPLANT
PACK CARDIAC CATHETERIZATION (CUSTOM PROCEDURE TRAY) ×1 IMPLANT
SHEATH PINNACLE 8F 10CM (SHEATH) IMPLANT
SLEEVE REPOSITIONING LENGTH 30 (MISCELLANEOUS) IMPLANT
TRANSDUCER W/STOPCOCK (MISCELLANEOUS) ×1 IMPLANT
TUBING ART PRESS 72  MALE/FEM (TUBING) ×1
TUBING ART PRESS 72 MALE/FEM (TUBING) IMPLANT
WIRE MICRO SET SILHO 5FR 7 (SHEATH) IMPLANT

## 2022-09-22 NOTE — Interval H&P Note (Signed)
History and Physical Interval Note:  09/22/2022 10:15 AM  Ricardo Zuniga  has presented today for surgery, with the diagnosis of heart failure.  The various methods of treatment have been discussed with the patient and family. After consideration of risks, benefits and other options for treatment, the patient has consented to  Procedure(s): RIGHT HEART CATH (N/A) as a surgical intervention.  The patient's history has been reviewed, patient examined, no change in status, stable for surgery.  I have reviewed the patient's chart and labs.  Questions were answered to the patient's satisfaction.     Tjay Velazquez

## 2022-09-22 NOTE — H&P (Addendum)
Advanced Heart Failure Team History and Physical Note   PCP:  Glenda Chroman, MD  PCP-Cardiology: [name], MD    AHF: [name]   Reason for Admission: Optimization of HF Prior to Scheduled VAD Implant    HPI:    Ricardo Zuniga is a very pleasant 72 YO WM w/ HFrEF 2/2 nonischemic cardiomyopathy, T2DM and hx of acute promyelocytic leukemia s/p ATRA ([facility] 2014) that was discharged from [facility] in October 2023 after presenting with a 1 week history of orthopnea, PND & LE edema. He had a TTE prior to admission w/ LVEF of 40-45%. Echo during admit was notable for LVEF of 30%-35%. LHC/RHC during admit w/ nonobstructive CAD & moderately reduced CI (2.1 L/min/m2). He was ultimately discharged home on low dose GDMT.    Prior to recent admit, his cardiac history dates back to 2015 when he was found to have an LVEF of 25% felt to be triggered by endocarditis.    Echo was done in 1/24 with EF 25%, severe LV dilation, global hypokinesis, moderate RV dysfunction, mild RV enlargement, moderate AI, mild-moderate MR.  RHC in 1/24 showed elevated right and left heart filling pressures, PVR 6.22 WU with mixed pulmonary venous/pulmonary arterial hypertension, CI low at 1.85.  Plan made for LVAD due to rapid deterioration, tentatively scheduled for 12/14.  He is being direct admitted today for RHC and optimization prior to VAD implant.   RHC today   HEMODYNAMICS: RA:                  13 mmHg (mean) RV:                  62/2-13 mmHg PA:                  64/28 mmHg (40 mean) PCWP:            23 mmHg (mean)                                      Estimated Fick CO/CI: 4.3 L/min, 2.17 L/min/m2    Thermodilution CO/CI: 4 L/min, 2 L/min/m2                                         TPG                 17  mmHg                                            PVR                 3.9-4.3 Wood Units  PAPi                2.76     BSA: 1.99 NIBP: 115/72 O2 sat: 93% PA sat: 63%, 62%      IMPRESSION: Moderately elevated pre and post capillary filling pressures.  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      Review of Systems: [y] = yes, [ ]$  = no   General: Weight  gain [ ]$ ; Weight loss [ ]$ ; Anorexia [ ]$ ; Fatigue [ ]$ ; Fever [ ]$ ; Chills [ ]$ ; Weakness [ ]$   Cardiac: Chest pain/pressure [ ]$ ; Resting SOB [ Y]; Exertional SOB [ Y]; Orthopnea [ ]$ ; Pedal Edema [ ]$ ; Palpitations [ ]$ ; Syncope [ ]$ ; Presyncope [ ]$ ; Paroxysmal nocturnal dyspnea[ ]$   Pulmonary: Cough [ ]$ ; Wheezing[ ]$ ; Hemoptysis[ ]$ ; Sputum [ ]$ ; Snoring [ ]$   GI: Vomiting[ ]$ ; Dysphagia[ ]$ ; Melena[ ]$ ; Hematochezia [ ]$ ; Heartburn[ ]$ ; Abdominal pain [ ]$ ; Constipation [ ]$ ; Diarrhea [ ]$ ; BRBPR [ ]$   GU: Hematuria[ ]$ ; Dysuria [ ]$ ; Nocturia[ ]$   Vascular: Pain in legs with walking [ ]$ ; Pain in feet with lying flat [ ]$ ; Non-healing sores [ ]$ ; Stroke [ ]$ ; TIA [ ]$ ; Slurred speech [ ]$ ;  Neuro: Headaches[ ]$ ; Vertigo[ ]$ ; Seizures[ ]$ ; Paresthesias[ ]$ ;Blurred vision [ ]$ ; Diplopia [ ]$ ; Vision changes [ ]$   Ortho/Skin: Arthritis [ ]$ ; Joint pain [ ]$ ; Muscle pain [ ]$ ; Joint swelling [ ]$ ; Back Pain [ ]$ ; Rash [ ]$   Psych: Depression[ ]$ ; Anxiety[ ]$   Heme: Bleeding problems [ ]$ ; Clotting disorders [ ]$ ; Anemia [ ]$   Endocrine: Diabetes [ ]$ ; Thyroid dysfunction[ ]$    Home Medications Prior to Admission medications   Medication Sig Start Date End Date Taking? Authorizing Provider  acetaminophen (TYLENOL) 650 MG CR tablet Take 650-1,300 mg by mouth every 8 (eight) hours as needed for pain.   Yes [provider]  albuterol (VENTOLIN HFA) 108 (90 Base) MCG/ACT inhaler Inhale 2 puffs into the lungs every 6 (six) hours as needed for wheezing or shortness of breath. 05/23/22  Yes Loel Dubonnet, NP  ALPRAZolam Duanne Moron) 0.25 MG tablet Take 0.25 mg by mouth daily at 6 PM. 05/13/22  Yes [provider]  amiodarone (PACERONE) 200 MG tablet Take 1 tablet (200 mg total) by mouth 2 (two) times daily. 08/19/22  Yes Sabharwal, Aditya, DO  aspirin EC 81 MG tablet Take 81 mg by mouth in the morning.   Yes [provider]  atorvastatin (LIPITOR) 40 MG tablet Take 1 tablet (40 mg total) by mouth daily. 03/21/22  Yes Satira Sark, MD  Cholecalciferol (VITAMIN D3) 125 MCG (5000 UT) TABS Take 5,000 Units by mouth in the morning.   Yes [provider]  Digoxin 62.5 MCG TABS Take 1 tablet by mouth daily. 09/03/22  Yes Earnie Larsson, NP  docusate sodium (COLACE) 100 MG capsule Take 200 mg by mouth every evening.   Yes [provider]  empagliflozin (JARDIANCE) 10 MG TABS tablet Take 10 mg by mouth every evening.   Yes [provider]  furosemide (LASIX) 20 MG tablet Take 40 mg by mouth 2 (two) times daily.   Yes [provider]  guaiFENesin (MUCINEX) 600 MG 12 hr tablet Take 600 mg by mouth daily as needed (congestion.).   Yes [provider]  Multiple Vitamin (MULTIVITAMIN WITH MINERALS) TABS tablet Take 1 tablet by mouth daily. 09/03/22  Yes Earnie Larsson, NP  omeprazole (PRILOSEC) 20 MG capsule Take 20 mg by mouth daily before breakfast.   Yes [provider]  ondansetron (ZOFRAN) 4 MG tablet Take 1 tablet (4 mg total) by mouth every 6 (six) hours as needed for nausea. 11/03/21  Yes Sheikh, Omair Latif, DO  potassium chloride SA (KLOR-CON M) 20 MEQ tablet Take 2 tablets (40 mEq total) by mouth daily. 09/19/22  Yes Larey Dresser, MD  repaglinide (PRANDIN) 2 MG  tablet Take 2-4 mg by mouth See admin instructions. Take 2 tablets (4 mg) by mouth in the morning & take 1 tablet (2 mg) by mouth in the evening. 07/06/17  Yes [provider]  sacubitril-valsartan Delene Loll) 24-26 MG Take 1 tablet by mouth twice daily 08/22/22  Yes Satira Sark, MD  spironolactone (ALDACTONE) 25 MG tablet Take 1 tablet (25 mg total) by mouth daily.  09/19/22  Yes Larey Dresser, MD  traZODone (DESYREL) 50 MG tablet Take 1 tablet (50 mg total) by mouth at bedtime as needed for sleep. 09/02/22  Yes Earnie Larsson, NP  zinc gluconate 50 MG tablet Take 50 mg by mouth in the morning.   Yes [provider]  diazepam (VALIUM) 5 MG tablet Take 5 mg by mouth daily as needed (vertigo). 10/17/21   [provider]    Past Medical History: Past Medical History:  Diagnosis Date   Anal fissure    APML (acute promyelocytic leukemia) in remission ([place])    Completed treatment 04/2013   CHF (congestive heart failure) (HCC)    Coronary artery disease    Nonobstructive at cardiac catheterization 09/2018   Difficult intubation    w/shoulder surgery at surgical center between 2005-2008   Gastroesophageal reflux disease    History of blood transfusion 2014   History of kidney stones    passed stones   Hypercholesteremia    Neuropathy    bilateral feet   Nonischemic cardiomyopathy ([place])    a. EF 20% in 2015 - thought to possibly be viral induced or chemotherapy induced from treatments in 2014 b. at 35-40% by echo in 05/2016    Peptic ulcer disease    Pneumonia    Yrs ago   Type 2 diabetes mellitus ([place])    Ureteral colic    Vertigo    tx with valium prn   Wears glasses    Wears hearing aid in both ears     Past Surgical History: Past Surgical History:  Procedure Laterality Date   ANAL FISSURE REPAIR     APPENDECTOMY     BIOPSY  08/17/2020   Procedure: BIOPSY;  Surgeon: Harvel Quale, MD;  [facility]: [facility];  Service: Gastroenterology;;   Wilmon Pali RELEASE Left 10/16/2017   Procedure: LEFT CARPAL TUNNEL RELEASE;  Surgeon: Earlie Server, MD;  Location: [place];  Service: Orthopedics;  Laterality: Left;   CARPAL TUNNEL RELEASE Right 11/25/2019   Procedure: CARPAL TUNNEL RELEASE;  Surgeon: Earlie Server, MD;  Location: [facility];  Service: Orthopedics;  Laterality: Right;   CATARACT EXTRACTION, BILATERAL      CERVICAL DISC ARTHROPLASTY N/A 01/05/2018   Procedure: Artificial Cervical Disc ReplacementCervical Three-Four, Cervical Four-Five;  Surgeon: Kristeen Miss, MD;  Location: [place];  Service: Neurosurgery;  Laterality: N/A;   CERVICAL FUSION     CHOLECYSTECTOMY     COLONOSCOPY N/A 06/27/2015   Procedure: COLONOSCOPY;  Surgeon: Rogene Houston, MD;  [facility];  Service: Endoscopy;  Laterality: N/A;  730   COLONOSCOPY WITH PROPOFOL N/A 08/17/2020   Procedure: COLONOSCOPY WITH PROPOFOL;  Surgeon: Harvel Quale, MD;  [facility];  Service: Gastroenterology;  Laterality: N/A;  11:15   COLONOSCOPY WITH PROPOFOL N/A 10/23/2021   Procedure: COLONOSCOPY WITH PROPOFOL;  Surgeon: Harvel Quale, MD;  [facility];  Service: Gastroenterology;  Laterality: N/A;  9:45am   ESOPHAGOGASTRODUODENOSCOPY (EGD) WITH PROPOFOL N/A 08/17/2020   Procedure: ESOPHAGOGASTRODUODENOSCOPY (EGD) WITH PROPOFOL;  Surgeon:  Harvel Quale, MD;  [facility];  Service: Gastroenterology;  Laterality: N/A;   KNEE SURGERY     x3   LEFT AND RIGHT HEART CATHETERIZATION WITH CORONARY ANGIOGRAM N/A 12/12/2013   Procedure: LEFT AND RIGHT HEART CATHETERIZATION WITH CORONARY ANGIOGRAM;  Surgeon: [name], MD;  [facility];  Service: Cardiovascular;  Laterality: N/A;   LEFT HEART CATH AND CORONARY ANGIOGRAPHY N/A 09/17/2018   Procedure: LEFT HEART CATH AND CORONARY ANGIOGRAPHY;  Surgeon: Wellington Hampshire, MD;  Location: [facility];  Service: Cardiovascular;  Laterality: N/A;   LUMBAR FUSION     LUMBAR LAMINECTOMY/DECOMPRESSION MICRODISCECTOMY Bilateral 01/02/2022   Procedure: Bilateral Lumbar Three-Four/Lumbar Four-Five Sublaminar decompression;  Surgeon: Kristeen Miss, MD;  Location: [place];  Service: Neurosurgery;  Laterality: Bilateral;  3C/RM 21 To Follow Dr Annette Stable   POLYPECTOMY  08/17/2020   Procedure: POLYPECTOMY;  Surgeon: Harvel Quale, MD;  [facility];  Service: Gastroenterology;;   POLYPECTOMY  10/23/2021   Procedure: POLYPECTOMY;  Surgeon: Harvel Quale, MD;  [facility];  Service: Gastroenterology;;   RIGHT HEART CATH N/A 09/01/2022   Procedure: RIGHT HEART CATH;  Surgeon: Hebert Soho, DO;  Location: [facility];  Service: Cardiovascular;  Laterality: N/A;   RIGHT/LEFT HEART CATH AND CORONARY ANGIOGRAPHY N/A 06/10/2022   Procedure: RIGHT/LEFT HEART CATH AND CORONARY ANGIOGRAPHY;  Surgeon: Hebert Soho, DO;  Location: [place] [facility];  Service: Cardiovascular;  Laterality: N/A;   SHOULDER ACROMIOPLASTY Right 07/18/2015   Procedure: SHOULDER ACROMIOPLASTY;  Surgeon: Earlie Server, MD;  Location: [facility];  Service: Orthopedics;  Laterality: Right;   SHOULDER ARTHROSCOPY Right 07/18/2015   Procedure: Right shoulder arthroscopy with debridement;  Surgeon: Earlie Server, MD;  Location: [facility];  Service: Orthopedics;  Laterality: Right;   SHOULDER SURGERY     x3   TONSILLECTOMY      Family History:  Family History  Problem Relation Age of Onset   Colon cancer Other     Social History: Social History   Socioeconomic History   Marital status: Married    Spouse name: Not on file   Number of children: Not on file   Years of education: Not on file   Highest education level: Not on file  Occupational History   Occupation: RETIRED    Comment: [employer]   Occupation: SECURITY GUARD  Tobacco Use   Smoking status: Former    Packs/day: 2.00    Years: 17.00    Total pack years: 34.00    Types: Cigarettes    Start date: 02/17/1968    Quit date: 02/28/1986    Years since quitting: 36.5   Smokeless tobacco: Never  Vaping Use   Vaping Use: Never used  Substance and Sexual Activity   Alcohol use: No    Alcohol/week: 0.0 standard drinks of alcohol   Drug use: No   Sexual activity: Not Currently  Other Topics  Concern   Not on file  Social History Narrative   Not on file   Social Determinants of Health   Financial Resource Strain: Low Risk  (09/03/2022)   Overall Financial Resource Strain (CARDIA)    Difficulty of Paying Living Expenses: Not hard at all  Food Insecurity: No Food Insecurity (09/01/2022)   Hunger Vital Sign    Worried About Running Out of Food in the Last Year: Never true    [place] in the  Last Year: Never true  Transportation Needs: No Transportation Needs (09/01/2022)   PRAPARE - Hydrologist (Medical): No    Lack of Transportation (Non-Medical): No  Physical Activity: Not on file  Stress: Not on file  Social Connections: Not on file    Allergies:  Allergies  Allergen Reactions   Zestril [Lisinopril] Cough    Objective:    Vital Signs:   Temp:  [97.1 F (36.2 C)] 97.1 F (36.2 C) (02/12 0824) Pulse Rate:  [64-88] 88 (02/12 1101) Resp:  [16-22] 16 (02/12 1103) BP: (116-141)/(73-78) 141/78 (02/12 1103) SpO2:  [95 %] 95 % (02/12 1012) Weight:  [82.6 kg] 82.6 kg (02/12 0824)   Filed Weights   09/22/22 0824  Weight: 82.6 kg     Physical Exam     General:  Well appearing. No respiratory difficulty HEENT: Normal Neck: Supple. JVD 13 cm. Carotids 2+ bilat; no bruits. No lymphadenopathy or thyromegaly appreciated. Cor: PMI nondisplaced. Regular rate & rhythm. No rubs, gallops or murmurs. Lungs: Clear Abdomen: Soft, nontender, nondistended. No hepatosplenomegaly. No bruits or masses. Good bowel sounds. Extremities: No cyanosis, clubbing, rash, edema Neuro: Alert & oriented x 3, cranial nerves grossly intact. moves all 4 extremities w/o difficulty. Affect pleasant.   Telemetry   NSR 70s   EKG   Pending   Labs     Basic Metabolic Panel: Recent Labs  Lab 09/19/22 1330  NA 138  K 4.0  CL 103  CO2 21*  GLUCOSE 144*  BUN 18  CREATININE 1.23  CALCIUM 8.8*    Liver Function Tests: Recent Labs  Lab  09/19/22 1330  AST 25  ALT 30  ALKPHOS 76  BILITOT 1.2  PROT 7.1  ALBUMIN 3.6   No results for input(s): "LIPASE", "AMYLASE" in the last 168 hours. No results for input(s): "AMMONIA" in the last 168 hours.  CBC: Recent Labs  Lab 09/19/22 1330  WBC 5.8  HGB 14.1  HCT 40.1  MCV 88.5  PLT 223    Cardiac Enzymes: No results for input(s): "CKTOTAL", "CKMB", "CKMBINDEX", "TROPONINI" in the last 168 hours.  BNP: BNP (last 3 results) Recent Labs    08/19/22 1256 09/01/22 1817 09/19/22 1330  BNP 1,916.6* 2,022.4* 1,954.3*    ProBNP (last 3 results) No results for input(s): "PROBNP" in the last 8760 hours.   CBG: Recent Labs  Lab 09/22/22 0824  GLUCAP 160*    Coagulation Studies: No results for input(s): "LABPROT", "INR" in the last 72 hours.  Imaging: CARDIAC CATHETERIZATION  Result Date: 09/22/2022 HEMODYNAMICS: RA:   13 mmHg (mean) RV:   62/2-13 mmHg PA:   64/28 mmHg (40 mean) PCWP:  23 mmHg (mean)    Estimated Fick CO/CI: 4.3 L/min, 2.17 L/min/m2   Thermodilution CO/CI: 4 L/min, 2 L/min/m2    TPG    17  mmHg     PVR     3.9-4.3 Wood Units PAPi      2.76  BSA: 1.99 NIBP: 115/72 O2 sat: 93% PA sat: 63%, 62% IMPRESSION: Moderately elevated pre and post capillary filling pressures. 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 Aditya Sabharwal Advanced Heart Failure Mechanical Circulatory Support 10:55 AM     Patient Profile   72 y/o male w/ long standing h/o chronic systolic heart failure due to nonischemic cardiomyopathy, T2DM and hx of acute promyelocytic leukemia s/p ATRA ([facility] 2014).  Recent development of rapidly progressive heart failure,  NYHA class IIIb-IV symptoms. Admitted for for RHC and optimization prior to VAD implant.  Assessment/Plan   1. Acute on Chronic Systolic CHF: Nonischemic cardiomyopathy.  Echo  in 1/24 showed EF 25%, severe LV dilation, global hypokinesis, moderate RV dysfunction, mild RV enlargement, moderate AI, mild-moderate MR. RHC in 1/24 with elevated filling pressures, low cardiac output, mixed pulmonary venous/arterial hypertension.  Given rapidly progressive heart failure, plan for LVAD placement later this week. Will optimize prior to implant. RHC this admission w/ elevated filling pressures and low output (RA 13, PAP 64/26 (40), PCWP 23, CI 2 L/min/m2 ) - Start milrinone 0.125 - leave swan in placement of hemodynamic monitoring  - plan VAD implant on Wednesday  - Give 40 mg IV Lasix x 1  - Continue spironolactone to 25 mg daily.  - Stop Entresto ahead of OR  - Hold Jardiance - Continue digoxin 0.0625 daily 2. H/o NSVT/PVCs  - Continue amiodarone 26m BID. Monitor while on milrinone. Low threshold to transition to amio gtt  3. [facility]  4. Hx of endocarditis: 2015     BLyda Jester PA-C 09/22/2022, 11:13 AM  Advanced Heart Failure Team Pager [phone](M-F; 7a - 5p)  Please contact [email or web address] for night-coverage after hours (4p -7a ) and weekends on [email or web address]

## 2022-09-22 NOTE — Progress Notes (Signed)
Heart Failure Navigator Progress Note  Assessed for Heart & Vascular TOC clinic readiness.  Patient does not meet criteria due to Advanced Heart failure Team patient.   Navigator will sign off at this time.   Earnestine Leys, BSN, Clinical cytogeneticist Only

## 2022-09-23 ENCOUNTER — Inpatient Hospital Stay (HOSPITAL_COMMUNITY): Payer: Medicare PPO

## 2022-09-23 ENCOUNTER — Other Ambulatory Visit (HOSPITAL_COMMUNITY): Payer: Medicare PPO

## 2022-09-23 ENCOUNTER — Encounter (HOSPITAL_COMMUNITY): Payer: Self-pay | Admitting: Cardiology

## 2022-09-23 DIAGNOSIS — I5023 Acute on chronic systolic (congestive) heart failure: Secondary | ICD-10-CM | POA: Diagnosis not present

## 2022-09-23 LAB — BASIC METABOLIC PANEL
Anion gap: 11 (ref 5–15)
BUN: 22 mg/dL (ref 8–23)
CO2: 25 mmol/L (ref 22–32)
Calcium: 9.3 mg/dL (ref 8.9–10.3)
Chloride: 98 mmol/L (ref 98–111)
Creatinine, Ser: 1.24 mg/dL (ref 0.61–1.24)
GFR, Estimated: >60 mL/min (ref >60)
Glucose, Bld: 217 mg/dL — ABNORMAL HIGH (ref 70–99)
Potassium: 4.2 mmol/L (ref 3.5–5.1)
Sodium: 134 mmol/L — ABNORMAL LOW (ref 135–145)

## 2022-09-23 LAB — MAGNESIUM: Magnesium: 2.1 mg/dL (ref 1.7–2.4)

## 2022-09-23 LAB — GLUCOSE, CAPILLARY
Glucose-Capillary: 234 mg/dL — ABNORMAL HIGH (ref 70–99)
Glucose-Capillary: 177 mg/dL — ABNORMAL HIGH (ref 70–99)
Glucose-Capillary: 162 mg/dL — ABNORMAL HIGH (ref 70–99)
Glucose-Capillary: 226 mg/dL — ABNORMAL HIGH (ref 70–99)

## 2022-09-23 LAB — CBC
HCT: 39.7 % (ref 39.0–52.0)
Hemoglobin: 13.3 g/dL (ref 13.0–17.0)
MCH: 30.7 pg (ref 26.0–34.0)
MCHC: 33.5 g/dL (ref 30.0–36.0)
MCV: 91.7 fL (ref 80.0–100.0)
Platelets: 190 10*3/uL (ref 150–400)
RBC: 4.33 MIL/uL (ref 4.22–5.81)
RDW: 16.1 % — ABNORMAL HIGH (ref 11.5–15.5)
WBC: 5.6 10*3/uL (ref 4.0–10.5)
nRBC: 0 % (ref 0.0–0.2)

## 2022-09-23 LAB — COMPREHENSIVE METABOLIC PANEL
ALT: 24 U/L (ref 0–44)
AST: 19 U/L (ref 15–41)
Albumin: 3.3 g/dL — ABNORMAL LOW (ref 3.5–5.0)
Alkaline Phosphatase: 66 U/L (ref 38–126)
Anion gap: 11 (ref 5–15)
BUN: 21 mg/dL (ref 8–23)
CO2: 26 mmol/L (ref 22–32)
Calcium: 9.3 mg/dL (ref 8.9–10.3)
Chloride: 103 mmol/L (ref 98–111)
Creatinine, Ser: 1 mg/dL (ref 0.61–1.24)
GFR, Estimated: >60 mL/min (ref >60)
Glucose, Bld: 127 mg/dL — ABNORMAL HIGH (ref 70–99)
Potassium: 3.3 mmol/L — ABNORMAL LOW (ref 3.5–5.1)
Sodium: 140 mmol/L (ref 135–145)
Total Bilirubin: 1 mg/dL (ref 0.3–1.2)
Total Protein: 6.4 g/dL — ABNORMAL LOW (ref 6.5–8.1)

## 2022-09-23 LAB — COOXEMETRY PANEL
Carboxyhemoglobin: 2.1 % — ABNORMAL HIGH (ref 0.5–1.5)
Methemoglobin: 0.8 % (ref 0.0–1.5)
O2 Saturation: 63.6 %
Total hemoglobin: 13.2 g/dL (ref 12.0–16.0)

## 2022-09-23 MED ORDER — VASOPRESSIN 20 UNITS/100 ML INFUSION FOR SHOCK
0.0400 [IU]/min | INTRAVENOUS | Status: DC
  Filled 2022-09-23: qty 100

## 2022-09-23 MED ORDER — TRANEXAMIC ACID (OHS) BOLUS VIA INFUSION
15.0000 mg/kg | INTRAVENOUS | Status: AC
  Administered 2022-09-24: 1281 mg via INTRAVENOUS
  Filled 2022-09-23: qty 1281

## 2022-09-23 MED ORDER — CHLORHEXIDINE GLUCONATE 4 % EX LIQD
60.0000 mL | Freq: Once | CUTANEOUS | Status: AC
  Administered 2022-09-24: 4 via TOPICAL
  Filled 2022-09-23: qty 15

## 2022-09-23 MED ORDER — INSULIN REGULAR(HUMAN) IN NACL 100-0.9 UT/100ML-% IV SOLN
INTRAVENOUS | Status: AC
  Administered 2022-09-24: 4.4 [IU]/h via INTRAVENOUS
  Filled 2022-09-23: qty 100

## 2022-09-23 MED ORDER — VANCOMYCIN HCL 1 G IV SOLR: 1000.0000 mg | INTRAVENOUS | Status: DC

## 2022-09-23 MED ORDER — TRANEXAMIC ACID (OHS) PUMP PRIME SOLUTION
2.0000 mg/kg | INTRAVENOUS | Status: DC
  Filled 2022-09-23: qty 1.71

## 2022-09-23 MED ORDER — DOPAMINE-DEXTROSE 3.2-5 MG/ML-% IV SOLN: 0.0000 ug/kg/min | INTRAVENOUS | Status: DC

## 2022-09-23 MED ORDER — CEFAZOLIN SODIUM-DEXTROSE 2-4 GM/100ML-% IV SOLN: 2.0000 g | INTRAVENOUS | Status: DC

## 2022-09-23 MED ORDER — SODIUM CHLORIDE 0.9 % IV SOLN
600.0000 mg | INTRAVENOUS | Status: AC
  Administered 2022-09-24: 600 mg via INTRAVENOUS
  Filled 2022-09-23: qty 10

## 2022-09-23 MED ORDER — NOREPINEPHRINE 4 MG/250ML-% IV SOLN: 0.0000 ug/min | INTRAVENOUS | Status: DC

## 2022-09-23 MED ORDER — SORBITOL 70 % SOLN
30.0000 mL | Freq: Once | Status: DC
  Filled 2022-09-23: qty 30

## 2022-09-23 MED ORDER — DEXMEDETOMIDINE HCL IN NACL 400 MCG/100ML IV SOLN: 0.1000 ug/kg/h | INTRAVENOUS | Status: DC

## 2022-09-23 MED ORDER — NITROGLYCERIN IN D5W 200-5 MCG/ML-% IV SOLN: 0.0000 ug/min | INTRAVENOUS | Status: DC

## 2022-09-23 MED ORDER — MILRINONE LACTATE IN DEXTROSE 20-5 MG/100ML-% IV SOLN: 0.3000 ug/kg/min | INTRAVENOUS | Status: DC

## 2022-09-23 MED ORDER — SODIUM CHLORIDE 0.9 % IV SOLN: 600.0000 mg | INTRAVENOUS | Status: DC

## 2022-09-23 MED ORDER — POTASSIUM CHLORIDE CRYS ER 20 MEQ PO TBCR
40.0000 meq | EXTENDED_RELEASE_TABLET | Freq: Once | ORAL | Status: AC
  Administered 2022-09-23: 40 meq via ORAL
  Filled 2022-09-23: qty 2

## 2022-09-23 MED ORDER — DEXMEDETOMIDINE HCL IN NACL 400 MCG/100ML IV SOLN
0.1000 ug/kg/h | INTRAVENOUS | Status: AC
  Administered 2022-09-24: .7 ug/kg/h via INTRAVENOUS
  Filled 2022-09-23: qty 100

## 2022-09-23 MED ORDER — PHENYLEPHRINE HCL-NACL 20-0.9 MG/250ML-% IV SOLN
0.0000 ug/min | INTRAVENOUS | Status: AC
  Administered 2022-09-24: 25 ug/min via INTRAVENOUS
  Filled 2022-09-23: qty 250

## 2022-09-23 MED ORDER — CHLORHEXIDINE GLUCONATE 0.12 % MT SOLN
15.0000 mL | Freq: Once | OROMUCOSAL | Status: AC
  Administered 2022-09-24: 15 mL via OROMUCOSAL
  Filled 2022-09-23: qty 15

## 2022-09-23 MED ORDER — FUROSEMIDE 10 MG/ML IJ SOLN
60.0000 mg | Freq: Once | INTRAMUSCULAR | Status: AC
  Administered 2022-09-23: 60 mg via INTRAVENOUS
  Filled 2022-09-23: qty 6

## 2022-09-23 MED ORDER — NOREPINEPHRINE 4 MG/250ML-% IV SOLN
0.0000 ug/min | INTRAVENOUS | Status: AC
  Administered 2022-09-24: 1 ug/min via INTRAVENOUS
  Filled 2022-09-23: qty 250

## 2022-09-23 MED ORDER — FLUCONAZOLE IN SODIUM CHLORIDE 400-0.9 MG/200ML-% IV SOLN: 400.0000 mg | INTRAVENOUS | Status: DC

## 2022-09-23 MED ORDER — TRANEXAMIC ACID (OHS) BOLUS VIA INFUSION: 15.0000 mg/kg | INTRAVENOUS | Status: DC

## 2022-09-23 MED ORDER — EPINEPHRINE HCL 5 MG/250ML IV SOLN IN NS: 0.0000 ug/min | INTRAVENOUS | Status: DC

## 2022-09-23 MED ORDER — TEMAZEPAM 7.5 MG PO CAPS: 15.0000 mg | ORAL_CAPSULE | Freq: Once | ORAL | Status: DC | PRN

## 2022-09-23 MED ORDER — DOBUTAMINE IN D5W 4-5 MG/ML-% IV SOLN
2.0000 ug/kg/min | INTRAVENOUS | Status: DC
  Filled 2022-09-23: qty 250

## 2022-09-23 MED ORDER — INSULIN ASPART 100 UNIT/ML IJ SOLN
0.0000 [IU] | Freq: Three times a day (TID) | INTRAMUSCULAR | Status: DC
  Administered 2022-09-23: 3 [IU] via SUBCUTANEOUS
  Administered 2022-09-23: 5 [IU] via SUBCUTANEOUS
  Administered 2022-09-23: 3 [IU] via SUBCUTANEOUS

## 2022-09-23 MED ORDER — MAGNESIUM SULFATE 50 % IJ SOLN
40.0000 meq | INTRAMUSCULAR | Status: DC
  Filled 2022-09-23: qty 9.85

## 2022-09-23 MED ORDER — TRANEXAMIC ACID (OHS) PUMP PRIME SOLUTION: 2.0000 mg/kg | INTRAVENOUS | Status: DC

## 2022-09-23 MED ORDER — BISACODYL 5 MG PO TBEC: 5.0000 mg | DELAYED_RELEASE_TABLET | Freq: Once | ORAL | Status: DC

## 2022-09-23 MED ORDER — MUPIROCIN 2 % EX OINT
1.0000 | TOPICAL_OINTMENT | Freq: Two times a day (BID) | CUTANEOUS | Status: DC
  Administered 2022-09-23: 1 via NASAL
  Filled 2022-09-23: qty 22

## 2022-09-23 MED ORDER — CHLORHEXIDINE GLUCONATE CLOTH 2 % EX PADS
6.0000 | MEDICATED_PAD | Freq: Every day | CUTANEOUS | Status: DC
  Administered 2022-09-23 – 2022-10-07 (×16): 6 via TOPICAL

## 2022-09-23 MED ORDER — DOPAMINE-DEXTROSE 3.2-5 MG/ML-% IV SOLN
0.0000 ug/kg/min | INTRAVENOUS | Status: DC
  Filled 2022-09-23: qty 250

## 2022-09-23 MED ORDER — INSULIN REGULAR(HUMAN) IN NACL 100-0.9 UT/100ML-% IV SOLN: INTRAVENOUS | Status: DC

## 2022-09-23 MED ORDER — NITROGLYCERIN IN D5W 200-5 MCG/ML-% IV SOLN
0.0000 ug/min | INTRAVENOUS | Status: DC
  Filled 2022-09-23: qty 250

## 2022-09-23 MED ORDER — POTASSIUM CHLORIDE 2 MEQ/ML IV SOLN
80.0000 meq | INTRAVENOUS | Status: DC
  Filled 2022-09-23: qty 40

## 2022-09-23 MED ORDER — MAGNESIUM SULFATE 50 % IJ SOLN: 40.0000 meq | INTRAMUSCULAR | Status: DC

## 2022-09-23 MED ORDER — FUROSEMIDE 10 MG/ML IJ SOLN
80.0000 mg | Freq: Once | INTRAMUSCULAR | Status: AC
  Administered 2022-09-23: 80 mg via INTRAVENOUS
  Filled 2022-09-23: qty 8

## 2022-09-23 MED ORDER — MILRINONE LACTATE IN DEXTROSE 20-5 MG/100ML-% IV SOLN
0.3000 ug/kg/min | INTRAVENOUS | Status: AC
  Administered 2022-09-24: .25 ug/kg/min via INTRAVENOUS
  Filled 2022-09-23: qty 100

## 2022-09-23 MED ORDER — POTASSIUM CHLORIDE CRYS ER 20 MEQ PO TBCR
40.0000 meq | EXTENDED_RELEASE_TABLET | ORAL | Status: AC
  Administered 2022-09-23 (×2): 40 meq via ORAL
  Filled 2022-09-23 (×2): qty 2

## 2022-09-23 MED ORDER — TRANEXAMIC ACID 1000 MG/10ML IV SOLN: 1.5000 mg/kg/h | INTRAVENOUS | Status: DC

## 2022-09-23 MED ORDER — VANCOMYCIN HCL 1500 MG/300ML IV SOLN
1500.0000 mg | INTRAVENOUS | Status: AC
  Administered 2022-09-24: 1500 mg via INTRAVENOUS
  Filled 2022-09-23: qty 300

## 2022-09-23 MED ORDER — VASOPRESSIN 20 UNITS/100 ML INFUSION FOR SHOCK: 0.0400 [IU]/min | INTRAVENOUS | Status: DC

## 2022-09-23 MED ORDER — CEFAZOLIN SODIUM-DEXTROSE 2-4 GM/100ML-% IV SOLN
2.0000 g | INTRAVENOUS | Status: AC
  Administered 2022-09-24: 2 g via INTRAVENOUS
  Filled 2022-09-23: qty 100

## 2022-09-23 MED ORDER — HEPARIN 30,000 UNITS/1000 ML (OHS) CELLSAVER SOLUTION: Status: DC

## 2022-09-23 MED ORDER — EPINEPHRINE HCL 5 MG/250ML IV SOLN IN NS
0.0000 ug/min | INTRAVENOUS | Status: AC
  Administered 2022-09-24: 4 ug/min via INTRAVENOUS
  Filled 2022-09-23: qty 250

## 2022-09-23 MED ORDER — VANCOMYCIN HCL 1500 MG/300ML IV SOLN: 1500.0000 mg | INTRAVENOUS | Status: DC

## 2022-09-23 MED ORDER — POTASSIUM CHLORIDE 2 MEQ/ML IV SOLN: 80.0000 meq | INTRAVENOUS | Status: DC

## 2022-09-23 MED ORDER — DOBUTAMINE IN D5W 4-5 MG/ML-% IV SOLN: 2.0000 ug/kg/min | INTRAVENOUS | Status: DC

## 2022-09-23 MED ORDER — FLUCONAZOLE IN SODIUM CHLORIDE 400-0.9 MG/200ML-% IV SOLN
400.0000 mg | INTRAVENOUS | Status: AC
  Administered 2022-09-24: 400 mg via INTRAVENOUS
  Filled 2022-09-23: qty 200

## 2022-09-23 MED ORDER — ORAL CARE MOUTH RINSE: 15.0000 mL | OROMUCOSAL | Status: DC | PRN

## 2022-09-23 MED ORDER — CHLORHEXIDINE GLUCONATE 4 % EX LIQD
60.0000 mL | Freq: Once | CUTANEOUS | Status: AC
  Administered 2022-09-23: 4 via TOPICAL
  Filled 2022-09-23: qty 15

## 2022-09-23 MED ORDER — VANCOMYCIN HCL 1 G IV SOLR
1000.0000 mg | INTRAVENOUS | Status: DC
  Filled 2022-09-23: qty 20

## 2022-09-23 MED ORDER — TRANEXAMIC ACID 1000 MG/10ML IV SOLN
1.5000 mg/kg/h | INTRAVENOUS | Status: AC
  Administered 2022-09-24: 1.5 mg/kg/h via INTRAVENOUS
  Filled 2022-09-23: qty 25

## 2022-09-23 MED ORDER — HEPARIN 30,000 UNITS/1000 ML (OHS) CELLSAVER SOLUTION
Status: DC
  Filled 2022-09-23: qty 1000

## 2022-09-23 MED ORDER — SENNOSIDES-DOCUSATE SODIUM 8.6-50 MG PO TABS: 2.0000 | ORAL_TABLET | Freq: Every day | ORAL | Status: DC

## 2022-09-23 MED ORDER — PHENYLEPHRINE HCL-NACL 20-0.9 MG/250ML-% IV SOLN: 0.0000 ug/min | INTRAVENOUS | Status: DC

## 2022-09-23 MED ORDER — POLYETHYLENE GLYCOL 3350 17 G PO PACK
17.0000 g | PACK | Freq: Every day | ORAL | Status: DC
  Administered 2022-09-26 – 2022-10-07 (×8): 17 g via ORAL
  Filled 2022-09-23 (×8): qty 1

## 2022-09-23 NOTE — Progress Notes (Signed)
Met with patient and family at bedside to sign procedural consent and answer questions. Patient in good spirits and states he is ready for surgery in the morning. All questions answered. VAD Coordinator to accompany patient to the OR tomorrow for implant.  Bobbye Morton RN, BSN VAD Coordinator 24/7 Pager [phone]

## 2022-09-23 NOTE — Progress Notes (Signed)
Advanced Heart Failure Rounding Note  PCP-Cardiologist: Ricardo Lesches, MD AHF: Ricardo Zuniga   Patient Profile   72 y/o male w/ long standing h/o chronic systolic heart failure due to nonischemic cardiomyopathy, T2DM and hx of acute promyelocytic leukemia s/p ATRA ([facility] 2014). Recent development of rapidly progressive heart failure,  NYHA class IIIb-IV symptoms. Admitted for for RHC and optimization prior to VAD implant.  RHC w/ elevated filling pressures and low output (RA 13, PAP 64/26 (40), PCWP 23, CI 2 L/min/m2) =>started on Milrinone  Subjective:    On Milrinone 0.25. Co-ox 64%. Good UOP w/ IV Lasix, -2.2L. CVP 10. SCr stable 1.00. K 3.3, Mg 2.1  Swan #s CVP 10  PAP 47/22 PCW 20  CO 4.4 CI 2.2  Co-ox 64% PAPi 2.5   Feels well today. No current dyspnea. Only complaint is constipation.    Objective:   Weight Range: 85.4 kg Body mass index is 27.8 kg/m.   Vital Signs:   Temp:  [96.8 F (36 C)-98.4 F (36.9 C)] 97 F (36.1 C) (02/13 0725) Pulse Rate:  [41-103] 41 (02/13 0725) Resp:  [11-28] 20 (02/13 0725) BP: (100-154)/(57-106) 112/57 (02/13 0725) SpO2:  [87 %-100 %] 93 % (02/13 0725) Weight:  [82.6 kg-85.4 kg] 85.4 kg (02/13 0500) Last BM Date : 09/21/22  Weight change: Filed Weights   09/22/22 0824 09/22/22 1508 09/23/22 0500  Weight: 82.6 kg 82.6 kg 85.4 kg    Intake/Output:   Intake/Output Summary (Last 24 hours) at 09/23/2022 0922 Last data filed at 09/23/2022 0700 Gross per 24 hour  Intake 300.87 ml  Output 2250 ml  Net -1949.13 ml      Physical Exam    CVP 10  General:  Well appearing. No resp difficulty HEENT: Normal Neck: Supple. JVP 10 cm + Rt IJ swan . Carotids 2+ bilat; no bruits. No lymphadenopathy or thyromegaly appreciated. Cor: PMI nondisplaced. Regular rate & rhythm. No rubs, gallops or murmurs. Lungs: Clear Abdomen: Soft, nontender, nondistended. No hepatosplenomegaly. No bruits or masses. Good bowel  sounds. Extremities: No cyanosis, clubbing, rash, edema Neuro: Alert & orientedx3, cranial nerves grossly intact. moves all 4 extremities w/o difficulty. Affect pleasant   Telemetry   NSR w/ PVCs (bigeminy)   EKG    N/A   Labs    CBC Recent Labs    09/22/22 1843 09/23/22 0416  WBC 6.5 5.6  HGB 14.4 13.3  HCT 43.1 39.7  MCV 91.9 91.7  PLT 226 [id number]   Basic Metabolic Panel Recent Labs    09/22/22 1031 09/22/22 1843 09/23/22 0416  NA 142  142  --  140  K 4.0  4.0  --  3.3*  CL  --   --  103  CO2  --   --  26  GLUCOSE  --   --  127*  BUN  --   --  21  CREATININE  --  1.18 1.00  CALCIUM  --   --  9.3  MG  --   --  2.1   Liver Function Tests Recent Labs    09/23/22 0416  AST 19  ALT 24  ALKPHOS 66  BILITOT 1.0  PROT 6.4*  ALBUMIN 3.3*   No results for input(s): "LIPASE", "AMYLASE" in the last 72 hours. Cardiac Enzymes No results for input(s): "CKTOTAL", "CKMB", "CKMBINDEX", "TROPONINI" in the last 72 hours.  BNP: BNP (last 3 results) Recent Labs    08/19/22 1256 09/01/22 1817 09/19/22 1330  BNP 1,916.6*  2,022.4* 1,954.3*    ProBNP (last 3 results) No results for input(s): "PROBNP" in the last 8760 hours.   D-Dimer No results for input(s): "DDIMER" in the last 72 hours. Hemoglobin A1C No results for input(s): "HGBA1C" in the last 72 hours. Fasting Lipid Panel No results for input(s): "CHOL", "HDL", "LDLCALC", "TRIG", "CHOLHDL", "LDLDIRECT" in the last 72 hours. Thyroid Function Tests No results for input(s): "TSH", "T4TOTAL", "T3FREE", "THYROIDAB" in the last 72 hours.  Invalid input(s): "FREET3"  Other results:   Imaging    DG Chest Port 1 View  Result Date: 09/23/2022 CLINICAL DATA:  Preop EXAM: PORTABLE CHEST 1 VIEW COMPARISON:  07/20/2022. FINDINGS: Enlarged cardiac silhouette. Pulmonary vascular congestion. No pneumothorax or pleural effusion. Calcified aorta. Right-sided Swan-Ganz catheter tip overlies right pulmonary artery.  IMPRESSION: Findings are consistent with CHF. Electronically Signed   By: Ricardo Zuniga M.D.   On: 09/23/2022 08:11   CARDIAC CATHETERIZATION  Result Date: 09/23/2022 HEMODYNAMICS: RA:   13 mmHg (mean) RV:   62/2-13 mmHg PA:   64/28 mmHg (40 mean) PCWP:  23 mmHg (mean)    Estimated Fick CO/CI: 4.3 L/min, 2.17 L/min/m2   Thermodilution CO/CI: 4 L/min, 2 L/min/m2    TPG    17  mmHg     PVR     3.9-4.3 Wood Units PAPi      2.76  BSA: 1.99 NIBP: 115/72 O2 sat: 93% PA sat: 63%, 62% IMPRESSION: Moderately elevated pre and post capillary filling pressures. 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 Ricardo Zuniga Advanced Heart Failure Mechanical Circulatory Support 10:55 AM     Medications:     Scheduled Medications:  ALPRAZolam  0.25 mg Oral q1800   amiodarone  200 mg Oral BID   aspirin EC  81 mg Oral Daily   atorvastatin  40 mg Oral Daily   Chlorhexidine Gluconate Cloth  6 each Topical Daily   digoxin  0.0625 mg Oral Daily   docusate sodium  200 mg Oral QPM   furosemide  60 mg Intravenous Once   heparin  5,000 Units Subcutaneous Q8H   insulin aspart  0-15 Units Subcutaneous TID WC   insulin aspart  0-6 Units Subcutaneous QHS   pantoprazole  40 mg Oral Daily   potassium chloride  40 mEq Oral Q4H   sodium chloride flush  3 mL Intravenous Q12H   spironolactone  25 mg Oral Daily    Infusions:  sodium chloride     milrinone 0.25 mcg/kg/min (09/23/22 0852)    PRN Medications: sodium chloride, acetaminophen, albuterol, mouth rinse, sodium chloride flush, traZODone    Patient Profile   72 y/o male w/ long standing h/o chronic systolic heart failure due to nonischemic cardiomyopathy, T2DM and hx of acute promyelocytic leukemia s/p ATRA ([facility] 2014). Recent development of rapidly progressive heart failure,  NYHA class IIIb-IV symptoms.  Admitted for for RHC and optimization prior to VAD implant.   Assessment/Plan   1. Acute on Chronic Systolic CHF: Nonischemic cardiomyopathy.  Echo in 1/24 showed EF 25%, severe LV dilation, global hypokinesis, moderate RV dysfunction, mild RV enlargement, moderate AI, mild-moderate MR. RHC in 1/24 with elevated filling pressures, low cardiac output, mixed pulmonary venous/arterial hypertension.  Given rapidly progressive heart failure, plan is for LVAD. RHC this admission w/ elevated filling pressures and low output (RA 13, PAP 64/26 (40), PCWP 23, CI 2 L/min/m2 ). Now on Milrinone 0.25.  Co-ox this morning 64%, CI 2.2. PAP improving, CVP 10, PCW 20. PAPi ok 2.5.  - Continue milrinone 0.25 mcg/kg/min - Continue diuresis today, 60 mg IV Lasix x 1  - plan VAD implant tomorrow  - Continue spironolactone 25 mg daily.  - Continue digoxin 0.0625 daily 2. H/o NSVT/PVCs  - Continue amiodarone 251m BID. Monitor while on milrinone. Low threshold to transition to amio gtt  3. [facility]ocytic [facility]  4. Hx of endocarditis: 2015 5. Hypokalemia: 3.3 - supp w/ diuresis  6. Constipation:  - start bowl regimen    Length of Stay: 1  Ivey Cina, PA-C  09/23/2022, 9:22 AM  Advanced Heart Failure Team Pager [phone](M-F; 7a - 5p)  Please contact [email or web address] for night-coverage after hours (5p -7a ) and weekends on [email or web address]

## 2022-09-23 NOTE — Progress Notes (Signed)
Ricardo Zuniga has been discussed with the VAD Medical Review board on 09/15/22. The team feels as if the patient is a good candidate for Destination LVAD therapy. The patient meets criteria for a LVAD implant as listed below:  1) NYHA Class: IV documented on 09/23/22  2) Has a left ventricular ejection fraction (LVEF) < 25%   *EF 25% by echo 09/02/22  3) Must meet one of the following:   Is inotrope dependent   *On inotropes Milrinone .25 mcg/kg/min started 09/22/22  OR  Has a Cardiac Index (CI) < 2.2  L/min/m2 while not on inotropes:   *CI: 2.0 09/22/22   4) Must meet one of the following:   ___X___ Is on optimal medical management (OMM), based on current heart failure practice guidelines for at least 39 of the last 60 days and are failing to respond   ______ Has advanced heart failure for at least 14 days and are dependent on an intra-aortic balloon pump (IABP) or similar temporary mechanical circulatory support for at least 7 days      ____ IABP inserted (date) ____      ____ Impella inserted (date) ____  5)  Social work and palliative care evaluations demonstrate appropriate support system in place for discharge to home with a VAD and that end of life discussions have taken place. Both services have expressed no concern regarding patient's candidacy.         *Social work consult (date): Raquel Sarna 09/02/22        *Palliative Care Consult (date): Vinie Sill [id number]  6)  Primary caretaker identified that can be taught along with the patient how to manage        the VAD equipment.        *Name: Ricardo Zuniga  7)  Deemed appropriate by our financial coordinator: Cecilio Asper        Prior approval: Ref #: [id number]  8)  VAD Coordinator, Bobbye Morton has met with patient and caregiver, shown them the VAD equipment and discussed with the patient and caregiver about lifestyle changes necessary for success on mechanical circulatory device.        *Met with patient and  wife [name] on 09/01/22.       *Consent for VAD Evaluation/Caregiver Agreement/HIPPA Release/Photo Release signed on 09/01/22   9)  Six Minute Walk:  Unable to perform due to [name] Catheter placement  10) KCCQ Pre VAD:  [facility] Cardiomyopathy Questionnaire  KCCQ-12    1 a. Ability to shower/bathe Not at all limited   1 b. Ability to walk 1 block Quite a bit limited   1 c. Ability to hurry/jog Extremely limited   2. Edema feet/ankles/legs Every morning   3. Limited by fatigue 3 or more times per week but not everyday   4. Limited by dyspnea 3 or more times per week but not everyday   5. Sitting up / on 3+ pillows Never over the past 2 weeks   6. Limited enjoyment of life It has moderately limited my enjoyment of life   7. Rest of life w/ symptoms Somewhat satisfied    8 a. Participation in hobbies Moderately limited   8 b. Participation in chores Moderately limited   8 c. Visiting family/friends Slightly limited    11)  Intermacs profile: 3  INTERMACS 1: Critical cardiogenic shock describes a patient who is "crashing and burning", in which a patient has life-threatening hypotension and rapidly escalating inotropic pressor support, with critical organ  hypoperfusion often confirmed by worsening acidosis and lactate levels.  INTERMACS 2: Progressive decline describes a patient who has been demonstrated "dependent" on inotropic support but nonetheless shows signs of continuing deterioration in nutrition, renal function, fluid retention, or other major status indicator. Patient profile 2 can also describe a patient with refractory volume overload, perhaps with evidence of impaired perfusion, in whom inotropic infusions cannot be maintained due to tachyarrhythmias, clinical ischemia, or other intolerance.  INTERMACS 3: Stable but inotrope dependent describes a patient who is clinically stable on mild-moderate doses of intravenous inotropes (or has a temporary circulatory support  device) after repeated documentation of failure to wean without symptomatic hypotension, worsening symptoms, or progressive organ dysfunction (usually renal). It is critical to monitor nutrition, renal function, fluid balance, and overall status carefully in order to distinguish between a   patient who is truly stable at Patient Profile 3 and a patient who has unappreciated decline rendering them Patient Profile 2. This patient may be either at home or in the hospital.      INTERMACS 4: Resting symptoms describes a patient who is at home on oral therapy but frequently has symptoms of congestion at rest or with activities of daily living (ADL). He or she may have orthopnea, shortness of breath during ADL such as dressing or bathing, gastrointestinal symptoms (abdominal discomfort, nausea, poor appetite), disabling ascites or severe lower extremity edema. This patient should be carefully considered for more intensive management and surveillance programs, which may in some cases, reveal poor compliance that would compromise outcomes with any therapy.   .   INTERMACS 5: Exertion Intolerant describes a patient who is comfortable at rest but unable to engage in any activity, living predominantly within the house or housebound. This patient has no congestive symptoms, but may have chronically elevated volume status, frequently with renal dysfunction, and may be characterized as exercise intolerant.      INTERMACS 6: Exertion Limited also describes a patient who is comfortable at rest without evidence of fluid overload, but who is able to do some mild activity. Activities of daily living are comfortable and minor activities outside the home such as visiting friends or going to a restaurant can be performed, but fatigue results within a few minutes of any meaningful physical exertion. This patient has occasional episodes of worsening symptoms and is likely to have had a hospitalization for heart failure within the  past year.   INTERMACS 7: Advanced NYHA Class 3 describes a patient who is clinically stable with a reasonable level of comfortable activity, despite history of previous decompensation that is not recent. This patient is usually able to walk more than a block. Any decompensation requiring intravenous diuretics or hospitalization within the previous month should make this person a Patient Profile 6 or lower.

## 2022-09-23 NOTE — TOC Initial Note (Signed)
Transition of Care [facility]) - Initial/Assessment Note    Patient Details  Name: Ricardo Zuniga MRN: [id number] Date of Birth: 1951-05-14  Transition of Care [facility] CM/SW Contact:    Erenest Rasher, RN Phone Number: 09/23/2022, 3:11 PM  Clinical Narrative:                 Spoke to pt and wife at bedside. Wife is a Therapist, sports and states she will be in the home to assist pt after surgery. Pt has scale at home for daily weights. He was independent PTA. Will continue to follow for dc needs.   Expected Discharge Plan: IP Rehab Facility Barriers to Discharge: Continued Medical Work up   Patient Goals and CMS Choice Patient states their goals for this hospitalization and ongoing recovery are:: wants to do well with surgery CMS Medicare.gov Compare Post Acute Care list provided to:: Patient        Expected Discharge Plan and Services   Discharge Planning Services: CM Consult   Living arrangements for the past 2 months: Single Family Home                                      Prior Living Arrangements/Services Living arrangements for the past 2 months: Single Family Home Lives with:: Spouse Patient language and need for interpreter reviewed:: Yes        Need for Family Participation in Patient Care: No (Comment) Care giver support system in place?: Yes (comment)   Criminal Activity/Legal Involvement Pertinent to Current Situation/Hospitalization: No - Comment as needed  Activities of Daily Living Home Assistive Devices/Equipment: None ADL Screening (condition at time of admission) Patient's cognitive ability adequate to safely complete daily activities?: Yes Is the patient deaf or have difficulty hearing?: No Does the patient have difficulty seeing, even when wearing glasses/contacts?: No Does the patient have difficulty concentrating, remembering, or making decisions?: No Patient able to express need for assistance with ADLs?: Yes Does the patient have difficulty dressing  or bathing?: No Independently performs ADLs?: Yes (appropriate for developmental age) Does the patient have difficulty walking or climbing stairs?: No Weakness of Arms/Hands: None  Permission Sought/Granted Permission sought to share information with : Case Manager, Family Supports Permission granted to share information with : Yes, Verbal Permission Granted  Share Information with NAME: Janzen Klebe     Permission granted to share info w Relationship: wife  Permission granted to share info w Contact Information: [phone]  Emotional Assessment Appearance:: Appears stated age Attitude/Demeanor/Rapport: Engaged Affect (typically observed): Accepting Orientation: : Oriented to Self, Oriented to Place, Oriented to  Time, Oriented to Situation   Psych Involvement: No (comment)  Admission diagnosis:  Acute on chronic systolic heart failure, NYHA class 4 (HCC) [I50.23] Patient Active Problem List   Diagnosis Date Noted   Acute on chronic systolic heart failure, NYHA class 4 ([place]) 09/22/2022   Solitary pulmonary nodule on lung CT 09/09/2022   Acute on chronic systolic (congestive) heart failure ([place]) 09/01/2022   Acute on chronic congestive heart failure ([place]) 06/09/2022   Acute on chronic HFrEF (heart failure with reduced ejection fraction) ([place]) 06/07/2022   CAP (community acquired pneumonia) 06/07/2022   Lumbar stenosis with neurogenic claudication 01/02/2022   Hypophosphatemia 11/02/2021   Normocytic anemia 0000000   Metabolic acidosis [id number]   Diabetic ketoacidosis without coma associated with type 2 diabetes mellitus ([place]) 10/31/2021   Hyponatremia 10/31/2021  Hyperbilirubinemia 10/31/2021   Diarrhea 10/31/2021   Facial cellulitis 10/30/2021   Sepsis ([place]) 10/30/2021   IBS (irritable bowel syndrome) 06/21/2020   History of colonic polyps 06/21/2020   Abnormal stress test    DOE (dyspnea on exertion)    Cervical spondylosis with myelopathy 01/05/2018    Transient hypotension 05/17/2016   GERD (gastroesophageal reflux disease) 05/17/2016   Chest pain 05/16/2016   Normal coronary arteries- cath 12/13/13 12/14/2013   Dyslipidemia 12/14/2013   Unstable angina ([place]) 12/11/2013   Intermediate coronary syndrome ([place]) 12/11/2013   Acute combined systolic and diastolic heart failure ([place]) 12/10/2013   DM type 2 (diabetes mellitus, type 2) ([place]) 12/10/2013   APML (acute promyelocytic leukemia) in remission ([place]) 12/10/2013   Congestive dilated cardiomyopathy- EF 20% at cath 12/13/13 08/10/2012   Abnormal echocardiogram 07/01/2012   Aortic insufficiency 07/01/2012   Hyperlipidemia 07/01/2012   PERS [place] 01/14/2010   Essential hypertension 01/13/2010   CHEST PAIN 01/13/2010   PCP:  Glenda Chroman, MD Pharmacy:   [facility] [address], [place] [address] Phone: [phone] Fax: [phone]  [facility] [address] [place] [address] Phone: [phone] Fax: [phone]     Social Determinants of Health (SDOH) Social History: SDOH Screenings   Food Insecurity: No Food Insecurity (09/01/2022)  Housing: Low Risk  (09/01/2022)  Transportation Needs: No Transportation Needs (09/01/2022)  Utilities: Not At Risk (09/01/2022)  Depression (PHQ2-9): Low Risk  (09/03/2022)  Financial Resource Strain: Low Risk  (09/03/2022)  Tobacco Use: Medium Risk (09/23/2022)   SDOH Interventions:     Readmission Risk Interventions    11/01/2021    3:26 PM  Readmission Risk Prevention Plan  Transportation Screening Complete  HRI or [place] Complete  Social Work Consult for [place] Planning/Counseling Complete  Palliative Care Screening Not Applicable  Medication Review Press photographer) Complete

## 2022-09-23 NOTE — Progress Notes (Signed)
CSW met with patient and wife at bedside. Patient provided Advanced Directives and CSW placed in the chart. Patient states he is in good spirits and ready for surgery tomorrow. CSW provided supportive intervention to patient and wife and will continue to follow. Raquel Sarna, [place], [place]

## 2022-09-24 ENCOUNTER — Inpatient Hospital Stay (HOSPITAL_COMMUNITY): Admission: RE | Admit: 2022-09-24 | Payer: Medicare PPO | Source: Home / Self Care | Admitting: Cardiothoracic Surgery

## 2022-09-24 ENCOUNTER — Other Ambulatory Visit: Payer: Self-pay

## 2022-09-24 ENCOUNTER — Inpatient Hospital Stay (HOSPITAL_COMMUNITY): Payer: Medicare PPO

## 2022-09-24 ENCOUNTER — Inpatient Hospital Stay (HOSPITAL_COMMUNITY)
Admission: AD | Disposition: A | Discharge status: Home Health | Payer: Self-pay | Source: Home / Self Care | Attending: Cardiothoracic Surgery

## 2022-09-24 ENCOUNTER — Inpatient Hospital Stay (HOSPITAL_COMMUNITY): Payer: Medicare PPO | Admitting: Certified Registered Nurse Anesthetist

## 2022-09-24 ENCOUNTER — Encounter (HOSPITAL_COMMUNITY): Payer: Self-pay | Admitting: Cardiology

## 2022-09-24 DIAGNOSIS — R570 Cardiogenic shock: Secondary | ICD-10-CM | POA: Diagnosis not present

## 2022-09-24 DIAGNOSIS — I255 Ischemic cardiomyopathy: Secondary | ICD-10-CM

## 2022-09-24 DIAGNOSIS — I110 Hypertensive heart disease with heart failure: Secondary | ICD-10-CM | POA: Diagnosis not present

## 2022-09-24 DIAGNOSIS — I351 Nonrheumatic aortic (valve) insufficiency: Secondary | ICD-10-CM | POA: Diagnosis not present

## 2022-09-24 DIAGNOSIS — I509 Heart failure, unspecified: Secondary | ICD-10-CM

## 2022-09-24 DIAGNOSIS — I428 Other cardiomyopathies: Secondary | ICD-10-CM | POA: Diagnosis not present

## 2022-09-24 DIAGNOSIS — I5023 Acute on chronic systolic (congestive) heart failure: Secondary | ICD-10-CM | POA: Diagnosis not present

## 2022-09-24 DIAGNOSIS — I25119 Atherosclerotic heart disease of native coronary artery with unspecified angina pectoris: Secondary | ICD-10-CM | POA: Diagnosis not present

## 2022-09-24 DIAGNOSIS — Z87891 Personal history of nicotine dependence: Secondary | ICD-10-CM

## 2022-09-24 HISTORY — PX: TEE WITHOUT CARDIOVERSION: SHX5443

## 2022-09-24 HISTORY — PX: INSERTION OF IMPLANTABLE LEFT VENTRICULAR ASSIST DEVICE: SHX5866

## 2022-09-24 LAB — POCT I-STAT 7, (LYTES, BLD GAS, ICA,H+H)
Acid-Base Excess: 1 mmol/L (ref 0.0–2.0)
Acid-Base Excess: 0 mmol/L (ref 0.0–2.0)
Acid-base deficit: 1 mmol/L (ref 0.0–2.0)
Acid-base deficit: 1 mmol/L (ref 0.0–2.0)
Acid-base deficit: 2 mmol/L (ref 0.0–2.0)
Acid-base deficit: 4 mmol/L — ABNORMAL HIGH (ref 0.0–2.0)
Bicarbonate: 24.2 mmol/L (ref 20.0–28.0)
Bicarbonate: 25.7 mmol/L (ref 20.0–28.0)
Bicarbonate: 23.7 mmol/L (ref 20.0–28.0)
Bicarbonate: 23.7 mmol/L (ref 20.0–28.0)
Bicarbonate: 23.5 mmol/L (ref 20.0–28.0)
Bicarbonate: 21.5 mmol/L (ref 20.0–28.0)
Calcium, Ion: 1.23 mmol/L (ref 1.15–1.40)
Calcium, Ion: 1.27 mmol/L (ref 1.15–1.40)
Calcium, Ion: 1.07 mmol/L — ABNORMAL LOW (ref 1.15–1.40)
Calcium, Ion: 1.07 mmol/L — ABNORMAL LOW (ref 1.15–1.40)
Calcium, Ion: 1.22 mmol/L (ref 1.15–1.40)
Calcium, Ion: 1.21 mmol/L (ref 1.15–1.40)
HCT: 39 % (ref 39.0–52.0)
HCT: 40 % (ref 39.0–52.0)
HCT: 30 % — ABNORMAL LOW (ref 39.0–52.0)
HCT: 30 % — ABNORMAL LOW (ref 39.0–52.0)
HCT: 31 % — ABNORMAL LOW (ref 39.0–52.0)
HCT: 26 % — ABNORMAL LOW (ref 39.0–52.0)
Hemoglobin: 13.3 g/dL (ref 13.0–17.0)
Hemoglobin: 13.6 g/dL (ref 13.0–17.0)
Hemoglobin: 10.2 g/dL — ABNORMAL LOW (ref 13.0–17.0)
Hemoglobin: 10.2 g/dL — ABNORMAL LOW (ref 13.0–17.0)
Hemoglobin: 10.5 g/dL — ABNORMAL LOW (ref 13.0–17.0)
Hemoglobin: 8.8 g/dL — ABNORMAL LOW (ref 13.0–17.0)
O2 Saturation: 96 %
O2 Saturation: 100 %
O2 Saturation: 100 %
O2 Saturation: 100 %
O2 Saturation: 99 %
O2 Saturation: 100 %
Patient temperature: 97.3
Patient temperature: 36.9
Patient temperature: 36.6
Potassium: 4 mmol/L (ref 3.5–5.1)
Potassium: 4.3 mmol/L (ref 3.5–5.1)
Potassium: 4 mmol/L (ref 3.5–5.1)
Potassium: 3.5 mmol/L (ref 3.5–5.1)
Potassium: 3.5 mmol/L (ref 3.5–5.1)
Potassium: 3.7 mmol/L (ref 3.5–5.1)
Sodium: 136 mmol/L (ref 135–145)
Sodium: 139 mmol/L (ref 135–145)
Sodium: 138 mmol/L (ref 135–145)
Sodium: 141 mmol/L (ref 135–145)
Sodium: 141 mmol/L (ref 135–145)
Sodium: 140 mmol/L (ref 135–145)
TCO2: 25 mmol/L (ref 22–32)
TCO2: 27 mmol/L (ref 22–32)
TCO2: 25 mmol/L (ref 22–32)
TCO2: 25 mmol/L (ref 22–32)
TCO2: 25 mmol/L (ref 22–32)
TCO2: 23 mmol/L (ref 22–32)
pCO2 arterial: 33.3 mmHg (ref 32–48)
pCO2 arterial: 44.5 mmHg (ref 32–48)
pCO2 arterial: 40.1 mmHg (ref 32–48)
pCO2 arterial: 39.7 mmHg (ref 32–48)
pCO2 arterial: 44.2 mmHg (ref 32–48)
pCO2 arterial: 39.3 mmHg (ref 32–48)
pH, Arterial: 7.467 — ABNORMAL HIGH (ref 7.35–7.45)
pH, Arterial: 7.37 (ref 7.35–7.45)
pH, Arterial: 7.38 (ref 7.35–7.45)
pH, Arterial: 7.383 (ref 7.35–7.45)
pH, Arterial: 7.334 — ABNORMAL LOW (ref 7.35–7.45)
pH, Arterial: 7.344 — ABNORMAL LOW (ref 7.35–7.45)
pO2, Arterial: 72 mmHg — ABNORMAL LOW (ref 83–108)
pO2, Arterial: 333 mmHg — ABNORMAL HIGH (ref 83–108)
pO2, Arterial: 508 mmHg — ABNORMAL HIGH (ref 83–108)
pO2, Arterial: 415 mmHg — ABNORMAL HIGH (ref 83–108)
pO2, Arterial: 144 mmHg — ABNORMAL HIGH (ref 83–108)
pO2, Arterial: 194 mmHg — ABNORMAL HIGH (ref 83–108)

## 2022-09-24 LAB — GLUCOSE, CAPILLARY
Glucose-Capillary: 149 mg/dL — ABNORMAL HIGH (ref 70–99)
Glucose-Capillary: 138 mg/dL — ABNORMAL HIGH (ref 70–99)
Glucose-Capillary: 145 mg/dL — ABNORMAL HIGH (ref 70–99)
Glucose-Capillary: 141 mg/dL — ABNORMAL HIGH (ref 70–99)
Glucose-Capillary: 166 mg/dL — ABNORMAL HIGH (ref 70–99)
Glucose-Capillary: 161 mg/dL — ABNORMAL HIGH (ref 70–99)
Glucose-Capillary: 156 mg/dL — ABNORMAL HIGH (ref 70–99)
Glucose-Capillary: 156 mg/dL — ABNORMAL HIGH (ref 70–99)
Glucose-Capillary: 143 mg/dL — ABNORMAL HIGH (ref 70–99)
Glucose-Capillary: 157 mg/dL — ABNORMAL HIGH (ref 70–99)

## 2022-09-24 LAB — CBC
HCT: 38.5 % — ABNORMAL LOW (ref 39.0–52.0)
HCT: 30.3 % — ABNORMAL LOW (ref 39.0–52.0)
HCT: 25.9 % — ABNORMAL LOW (ref 39.0–52.0)
Hemoglobin: 13.4 g/dL (ref 13.0–17.0)
Hemoglobin: 10.4 g/dL — ABNORMAL LOW (ref 13.0–17.0)
Hemoglobin: 8.9 g/dL — ABNORMAL LOW (ref 13.0–17.0)
MCH: 31.5 pg (ref 26.0–34.0)
MCH: 31.4 pg (ref 26.0–34.0)
MCH: 31.7 pg (ref 26.0–34.0)
MCHC: 34.8 g/dL (ref 30.0–36.0)
MCHC: 34.3 g/dL (ref 30.0–36.0)
MCHC: 34.4 g/dL (ref 30.0–36.0)
MCV: 90.4 fL (ref 80.0–100.0)
MCV: 91.5 fL (ref 80.0–100.0)
MCV: 92.2 fL (ref 80.0–100.0)
Platelets: 188 10*3/uL (ref 150–400)
Platelets: 132 10*3/uL — ABNORMAL LOW (ref 150–400)
Platelets: 131 10*3/uL — ABNORMAL LOW (ref 150–400)
RBC: 4.26 MIL/uL (ref 4.22–5.81)
RBC: 3.31 MIL/uL — ABNORMAL LOW (ref 4.22–5.81)
RBC: 2.81 MIL/uL — ABNORMAL LOW (ref 4.22–5.81)
RDW: 15.9 % — ABNORMAL HIGH (ref 11.5–15.5)
RDW: 15.6 % — ABNORMAL HIGH (ref 11.5–15.5)
RDW: 15.8 % — ABNORMAL HIGH (ref 11.5–15.5)
WBC: 5.5 10*3/uL (ref 4.0–10.5)
WBC: 15.4 10*3/uL — ABNORMAL HIGH (ref 4.0–10.5)
WBC: 13.1 10*3/uL — ABNORMAL HIGH (ref 4.0–10.5)
nRBC: 0 % (ref 0.0–0.2)
nRBC: 0 % (ref 0.0–0.2)
nRBC: 0 % (ref 0.0–0.2)

## 2022-09-24 LAB — BASIC METABOLIC PANEL
Anion gap: 9 (ref 5–15)
Anion gap: 10 (ref 5–15)
BUN: 22 mg/dL (ref 8–23)
BUN: 19 mg/dL (ref 8–23)
CO2: 26 mmol/L (ref 22–32)
CO2: 25 mmol/L (ref 22–32)
Calcium: 9.2 mg/dL (ref 8.9–10.3)
Calcium: 8.7 mg/dL — ABNORMAL LOW (ref 8.9–10.3)
Chloride: 101 mmol/L (ref 98–111)
Chloride: 103 mmol/L (ref 98–111)
Creatinine, Ser: 1.14 mg/dL (ref 0.61–1.24)
Creatinine, Ser: 1.17 mg/dL (ref 0.61–1.24)
GFR, Estimated: >60 mL/min (ref >60)
GFR, Estimated: >60 mL/min (ref >60)
Glucose, Bld: 122 mg/dL — ABNORMAL HIGH (ref 70–99)
Glucose, Bld: 133 mg/dL — ABNORMAL HIGH (ref 70–99)
Potassium: 3.8 mmol/L (ref 3.5–5.1)
Potassium: 3.4 mmol/L — ABNORMAL LOW (ref 3.5–5.1)
Sodium: 136 mmol/L (ref 135–145)
Sodium: 138 mmol/L (ref 135–145)

## 2022-09-24 LAB — POCT I-STAT, CHEM 8
BUN: 23 mg/dL (ref 8–23)
BUN: 23 mg/dL (ref 8–23)
BUN: 20 mg/dL (ref 8–23)
BUN: 23 mg/dL (ref 8–23)
BUN: 21 mg/dL (ref 8–23)
BUN: 22 mg/dL (ref 8–23)
BUN: 24 mg/dL — ABNORMAL HIGH (ref 8–23)
Calcium, Ion: 1.28 mmol/L (ref 1.15–1.40)
Calcium, Ion: 1.19 mmol/L (ref 1.15–1.40)
Calcium, Ion: 0.92 mmol/L — ABNORMAL LOW (ref 1.15–1.40)
Calcium, Ion: 1.22 mmol/L (ref 1.15–1.40)
Calcium, Ion: 1.09 mmol/L — ABNORMAL LOW (ref 1.15–1.40)
Calcium, Ion: 1.04 mmol/L — ABNORMAL LOW (ref 1.15–1.40)
Calcium, Ion: 0.97 mmol/L — ABNORMAL LOW (ref 1.15–1.40)
Chloride: 102 mmol/L (ref 98–111)
Chloride: 104 mmol/L (ref 98–111)
Chloride: 102 mmol/L (ref 98–111)
Chloride: 103 mmol/L (ref 98–111)
Chloride: 102 mmol/L (ref 98–111)
Chloride: 101 mmol/L (ref 98–111)
Chloride: 102 mmol/L (ref 98–111)
Creatinine, Ser: 1 mg/dL (ref 0.61–1.24)
Creatinine, Ser: 1 mg/dL (ref 0.61–1.24)
Creatinine, Ser: 0.9 mg/dL (ref 0.61–1.24)
Creatinine, Ser: 1 mg/dL (ref 0.61–1.24)
Creatinine, Ser: 1 mg/dL (ref 0.61–1.24)
Creatinine, Ser: 1 mg/dL (ref 0.61–1.24)
Creatinine, Ser: 1 mg/dL (ref 0.61–1.24)
Glucose, Bld: 157 mg/dL — ABNORMAL HIGH (ref 70–99)
Glucose, Bld: 145 mg/dL — ABNORMAL HIGH (ref 70–99)
Glucose, Bld: 120 mg/dL — ABNORMAL HIGH (ref 70–99)
Glucose, Bld: 136 mg/dL — ABNORMAL HIGH (ref 70–99)
Glucose, Bld: 121 mg/dL — ABNORMAL HIGH (ref 70–99)
Glucose, Bld: 129 mg/dL — ABNORMAL HIGH (ref 70–99)
Glucose, Bld: 193 mg/dL — ABNORMAL HIGH (ref 70–99)
HCT: 38 % — ABNORMAL LOW (ref 39.0–52.0)
HCT: 42 % (ref 39.0–52.0)
HCT: 30 % — ABNORMAL LOW (ref 39.0–52.0)
HCT: 38 % — ABNORMAL LOW (ref 39.0–52.0)
HCT: 29 % — ABNORMAL LOW (ref 39.0–52.0)
HCT: 27 % — ABNORMAL LOW (ref 39.0–52.0)
HCT: 29 % — ABNORMAL LOW (ref 39.0–52.0)
Hemoglobin: 12.9 g/dL — ABNORMAL LOW (ref 13.0–17.0)
Hemoglobin: 14.3 g/dL (ref 13.0–17.0)
Hemoglobin: 10.2 g/dL — ABNORMAL LOW (ref 13.0–17.0)
Hemoglobin: 12.9 g/dL — ABNORMAL LOW (ref 13.0–17.0)
Hemoglobin: 9.9 g/dL — ABNORMAL LOW (ref 13.0–17.0)
Hemoglobin: 9.2 g/dL — ABNORMAL LOW (ref 13.0–17.0)
Hemoglobin: 9.9 g/dL — ABNORMAL LOW (ref 13.0–17.0)
Potassium: 4.3 mmol/L (ref 3.5–5.1)
Potassium: 4.3 mmol/L (ref 3.5–5.1)
Potassium: 3.9 mmol/L (ref 3.5–5.1)
Potassium: 4.1 mmol/L (ref 3.5–5.1)
Potassium: 4.2 mmol/L (ref 3.5–5.1)
Potassium: 4.4 mmol/L (ref 3.5–5.1)
Potassium: 3.5 mmol/L (ref 3.5–5.1)
Sodium: 139 mmol/L (ref 135–145)
Sodium: 139 mmol/L (ref 135–145)
Sodium: 139 mmol/L (ref 135–145)
Sodium: 139 mmol/L (ref 135–145)
Sodium: 139 mmol/L (ref 135–145)
Sodium: 140 mmol/L (ref 135–145)
Sodium: 141 mmol/L (ref 135–145)
TCO2: 26 mmol/L (ref 22–32)
TCO2: 25 mmol/L (ref 22–32)
TCO2: 26 mmol/L (ref 22–32)
TCO2: 26 mmol/L (ref 22–32)
TCO2: 27 mmol/L (ref 22–32)
TCO2: 27 mmol/L (ref 22–32)
TCO2: 26 mmol/L (ref 22–32)

## 2022-09-24 LAB — GLOBAL TEG PANEL
CFF Max Amplitude: 14.3 mm — ABNORMAL LOW (ref 15–32)
CK with Heparinase (R): 7.1 min (ref 4.3–8.3)
Citrated Functional Fibrinogen: 260.9 mg/dL — ABNORMAL LOW (ref 278–581)
Citrated Kaolin (K): 2.3 min — ABNORMAL HIGH (ref 0.8–2.1)
Citrated Kaolin (MA): 51.2 mm — ABNORMAL LOW (ref 52–69)
Citrated Kaolin (R): 7.4 min (ref 4.6–9.1)
Citrated Kaolin Angle: 65.1 deg (ref 63–78)
Citrated Rapid TEG (MA): 53.2 mm (ref 52–70)

## 2022-09-24 LAB — DIC (DISSEMINATED INTRAVASCULAR COAGULATION)PANEL
D-Dimer, Quant: 0.96 ug/mL-FEU — ABNORMAL HIGH (ref 0.00–0.50)
Fibrinogen: 273 mg/dL (ref 210–475)
INR: 1.7 — ABNORMAL HIGH (ref 0.8–1.2)
Platelets: 132 10*3/uL — ABNORMAL LOW (ref 150–400)
Prothrombin Time: 19.8 seconds — ABNORMAL HIGH (ref 11.4–15.2)
Smear Review: NONE SEEN
aPTT: 31 seconds (ref 24–36)

## 2022-09-24 LAB — ECHO INTRAOPERATIVE TEE
AV Mean grad: 3 mmHg
Height: 69 in
Weight: 2895.96 oz

## 2022-09-24 LAB — LACTIC ACID, PLASMA
Lactic Acid, Venous: 2.5 mmol/L (ref 0.5–1.9)
Lactic Acid, Venous: 2.4 mmol/L (ref 0.5–1.9)

## 2022-09-24 LAB — MAGNESIUM
Magnesium: 2 mg/dL (ref 1.7–2.4)
Magnesium: 2 mg/dL (ref 1.7–2.4)
Magnesium: 2.6 mg/dL — ABNORMAL HIGH (ref 1.7–2.4)

## 2022-09-24 LAB — COOXEMETRY PANEL
Carboxyhemoglobin: 3 % — ABNORMAL HIGH (ref 0.5–1.5)
Carboxyhemoglobin: 2.5 % — ABNORMAL HIGH (ref 0.5–1.5)
Methemoglobin: <0.7 % (ref 0.0–1.5)
Methemoglobin: <0.7 % (ref 0.0–1.5)
O2 Saturation: 65.7 %
O2 Saturation: 83.6 %
Total hemoglobin: 13.7 g/dL (ref 12.0–16.0)
Total hemoglobin: 9.9 g/dL — ABNORMAL LOW (ref 12.0–16.0)

## 2022-09-24 LAB — HEMOGLOBIN A1C
Hgb A1c MFr Bld: 7 % — ABNORMAL HIGH (ref 4.8–5.6)
Mean Plasma Glucose: 154.2 mg/dL

## 2022-09-24 LAB — PREPARE RBC (CROSSMATCH)

## 2022-09-24 LAB — HEMOGLOBIN AND HEMATOCRIT, BLOOD
HCT: 27 % — ABNORMAL LOW (ref 39.0–52.0)
Hemoglobin: 9 g/dL — ABNORMAL LOW (ref 13.0–17.0)

## 2022-09-24 LAB — APTT: aPTT: 31 seconds (ref 24–36)

## 2022-09-24 LAB — PLATELET COUNT: Platelets: 139 10*3/uL — ABNORMAL LOW (ref 150–400)

## 2022-09-24 LAB — PROTIME-INR
INR: 1.3 — ABNORMAL HIGH (ref 0.8–1.2)
Prothrombin Time: 15.6 seconds — ABNORMAL HIGH (ref 11.4–15.2)

## 2022-09-24 LAB — FIBRINOGEN: Fibrinogen: 251 mg/dL (ref 210–475)

## 2022-09-24 SURGERY — INSERTION OF IMPLANTABLE LEFT VENTRICULAR ASSIST DEVICE
Anesthesia: General | Site: Chest

## 2022-09-24 MED ORDER — FENTANYL CITRATE (PF) 250 MCG/5ML IJ SOLN
INTRAMUSCULAR | Status: AC
  Filled 2022-09-24: qty 5

## 2022-09-24 MED ORDER — VANCOMYCIN HCL 1000 MG IV SOLR
INTRAVENOUS | Status: DC | PRN
  Administered 2022-09-24: 1000 mL

## 2022-09-24 MED ORDER — AMIODARONE HCL IN DEXTROSE 360-4.14 MG/200ML-% IV SOLN: 30.0000 mg/h | INTRAVENOUS | Status: DC

## 2022-09-24 MED ORDER — PHENYLEPHRINE 80 MCG/ML (10ML) SYRINGE FOR IV PUSH (FOR BLOOD PRESSURE SUPPORT)
PREFILLED_SYRINGE | INTRAVENOUS | Status: DC | PRN
  Administered 2022-09-24: 160 ug via INTRAVENOUS
  Administered 2022-09-24 (×4): 80 ug via INTRAVENOUS

## 2022-09-24 MED ORDER — ACETAMINOPHEN 650 MG RE SUPP
650.0000 mg | Freq: Once | RECTAL | Status: AC
  Administered 2022-09-24: 650 mg via RECTAL

## 2022-09-24 MED ORDER — HEPARIN SODIUM (PORCINE) 1000 UNIT/ML IJ SOLN
INTRAMUSCULAR | Status: DC | PRN
  Administered 2022-09-24: 29000 [IU] via INTRAVENOUS

## 2022-09-24 MED ORDER — ASPIRIN 81 MG PO CHEW
324.0000 mg | CHEWABLE_TABLET | Freq: Every day | ORAL | Status: DC
  Administered 2022-09-25 – 2022-09-28 (×2): 324 mg
  Filled 2022-09-24 (×2): qty 4

## 2022-09-24 MED ORDER — CALCIUM CHLORIDE 10 % IV SOLN
INTRAVENOUS | Status: DC | PRN
  Administered 2022-09-24 (×5): 200 mg via INTRAVENOUS

## 2022-09-24 MED ORDER — ROCURONIUM BROMIDE 10 MG/ML (PF) SYRINGE
PREFILLED_SYRINGE | INTRAVENOUS | Status: DC | PRN
  Administered 2022-09-24: 50 mg via INTRAVENOUS
  Administered 2022-09-24: 70 mg via INTRAVENOUS
  Administered 2022-09-24: 30 mg via INTRAVENOUS
  Administered 2022-09-24: 50 mg via INTRAVENOUS

## 2022-09-24 MED ORDER — PANTOPRAZOLE SODIUM 40 MG PO TBEC: 40.0000 mg | DELAYED_RELEASE_TABLET | Freq: Every day | ORAL | Status: DC

## 2022-09-24 MED ORDER — VANCOMYCIN HCL 1000 MG IV SOLR
INTRAVENOUS | Status: DC | PRN
  Administered 2022-09-24: 1000 mg via TOPICAL

## 2022-09-24 MED ORDER — PHENYLEPHRINE HCL (PRESSORS) 10 MG/ML IV SOLN
INTRAVENOUS | Status: AC
  Filled 2022-09-24: qty 1

## 2022-09-24 MED ORDER — PROPOFOL 10 MG/ML IV BOLUS
INTRAVENOUS | Status: DC | PRN
  Administered 2022-09-24: 30 mg via INTRAVENOUS
  Administered 2022-09-24: 50 mg via INTRAVENOUS

## 2022-09-24 MED ORDER — LACTATED RINGERS IV SOLN: INTRAVENOUS | Status: DC

## 2022-09-24 MED ORDER — EPINEPHRINE HCL 5 MG/250ML IV SOLN IN NS
1.0000 ug/min | INTRAVENOUS | Status: DC
  Administered 2022-09-25: 5 ug/min via INTRAVENOUS
  Administered 2022-09-26 – 2022-09-27 (×2): 4 ug/min via INTRAVENOUS
  Administered 2022-09-28: 2 ug/min via INTRAVENOUS
  Filled 2022-09-24 (×4): qty 250

## 2022-09-24 MED ORDER — PROTAMINE SULFATE 10 MG/ML IV SOLN
INTRAVENOUS | Status: DC | PRN
  Administered 2022-09-24: 290 mg via INTRAVENOUS

## 2022-09-24 MED ORDER — SODIUM CHLORIDE 0.9% IV SOLUTION: Freq: Once | INTRAVENOUS | Status: AC

## 2022-09-24 MED ORDER — SODIUM CHLORIDE 0.9 % IV SOLN: 250.0000 mL | INTRAVENOUS | Status: DC

## 2022-09-24 MED ORDER — CEFAZOLIN SODIUM-DEXTROSE 2-4 GM/100ML-% IV SOLN
2.0000 g | Freq: Three times a day (TID) | INTRAVENOUS | Status: AC
  Administered 2022-09-24 – 2022-09-26 (×6): 2 g via INTRAVENOUS
  Filled 2022-09-24 (×5): qty 100

## 2022-09-24 MED ORDER — SODIUM CHLORIDE 0.9 % IV SOLN: INTRAVENOUS | Status: DC

## 2022-09-24 MED ORDER — SODIUM CHLORIDE 0.9% FLUSH
3.0000 mL | Freq: Two times a day (BID) | INTRAVENOUS | Status: DC
  Administered 2022-09-25 – 2022-10-07 (×19): 3 mL via INTRAVENOUS

## 2022-09-24 MED ORDER — ACETAMINOPHEN 500 MG PO TABS
1000.0000 mg | ORAL_TABLET | Freq: Four times a day (QID) | ORAL | Status: AC
  Administered 2022-09-26 – 2022-09-29 (×11): 1000 mg via ORAL
  Filled 2022-09-24 (×12): qty 2

## 2022-09-24 MED ORDER — NOREPINEPHRINE 4 MG/250ML-% IV SOLN
2.0000 ug/min | INTRAVENOUS | Status: DC
  Administered 2022-09-24 – 2022-09-25 (×3): 13 ug/min via INTRAVENOUS
  Filled 2022-09-24 (×3): qty 250

## 2022-09-24 MED ORDER — MAGNESIUM SULFATE 4 GM/100ML IV SOLN
4.0000 g | Freq: Once | INTRAVENOUS | Status: AC
  Administered 2022-09-24: 4 g via INTRAVENOUS
  Filled 2022-09-24: qty 100

## 2022-09-24 MED ORDER — SODIUM CHLORIDE (PF) 0.9 % IJ SOLN
INTRAMUSCULAR | Status: DC | PRN
  Administered 2022-09-24: 1000 mL

## 2022-09-24 MED ORDER — BUPIVACAINE LIPOSOME 1.3 % IJ SUSP
INTRAMUSCULAR | Status: DC | PRN
  Administered 2022-09-24: 50 mL

## 2022-09-24 MED ORDER — DOCUSATE SODIUM 100 MG PO CAPS: 200.0000 mg | ORAL_CAPSULE | Freq: Every day | ORAL | Status: DC

## 2022-09-24 MED ORDER — 0.9 % SODIUM CHLORIDE (POUR BTL) OPTIME
TOPICAL | Status: DC | PRN
  Administered 2022-09-24: 6000 mL
  Administered 2022-09-24: 1000 mL

## 2022-09-24 MED ORDER — INSULIN REGULAR(HUMAN) IN NACL 100-0.9 UT/100ML-% IV SOLN
INTRAVENOUS | Status: DC
  Administered 2022-09-25: 2.4 [IU]/h via INTRAVENOUS
  Filled 2022-09-24: qty 100

## 2022-09-24 MED ORDER — ALBUMIN HUMAN 5 % IV SOLN
250.0000 mL | INTRAVENOUS | Status: AC | PRN
  Administered 2022-09-24: 12.5 g via INTRAVENOUS
  Filled 2022-09-24 (×2): qty 250

## 2022-09-24 MED ORDER — SODIUM CHLORIDE 0.9% IV SOLUTION: Freq: Once | INTRAVENOUS | Status: DC

## 2022-09-24 MED ORDER — HEMOSTATIC AGENTS (NO CHARGE) OPTIME
TOPICAL | Status: DC | PRN
  Administered 2022-09-24 (×3): 1 via TOPICAL

## 2022-09-24 MED ORDER — DEXTROSE 50 % IV SOLN: 0.0000 mL | INTRAVENOUS | Status: DC | PRN

## 2022-09-24 MED ORDER — ETOMIDATE 2 MG/ML IV SOLN
INTRAVENOUS | Status: AC
  Filled 2022-09-24: qty 10

## 2022-09-24 MED ORDER — CHLORHEXIDINE GLUCONATE 0.12 % MT SOLN
15.0000 mL | OROMUCOSAL | Status: AC
  Administered 2022-09-24: 15 mL via OROMUCOSAL
  Filled 2022-09-24: qty 15

## 2022-09-24 MED ORDER — ALBUMIN HUMAN 5 % IV SOLN
250.0000 mL | INTRAVENOUS | Status: DC | PRN
  Administered 2022-09-24 (×2): 12.5 g via INTRAVENOUS
  Filled 2022-09-24: qty 250

## 2022-09-24 MED ORDER — ASPIRIN 300 MG RE SUPP: 300.0000 mg | Freq: Every day | RECTAL | Status: DC

## 2022-09-24 MED ORDER — SODIUM CHLORIDE (PF) 0.9 % IJ SOLN
INTRAMUSCULAR | Status: AC
  Filled 2022-09-24: qty 10

## 2022-09-24 MED ORDER — TRAMADOL HCL 50 MG PO TABS
50.0000 mg | ORAL_TABLET | ORAL | Status: DC | PRN
  Administered 2022-09-26 – 2022-09-30 (×6): 100 mg via ORAL
  Filled 2022-09-24 (×6): qty 2

## 2022-09-24 MED ORDER — MIDAZOLAM HCL (PF) 5 MG/ML IJ SOLN
INTRAMUSCULAR | Status: DC | PRN
  Administered 2022-09-24: 2 mg via INTRAVENOUS
  Administered 2022-09-24 (×5): 1 mg via INTRAVENOUS

## 2022-09-24 MED ORDER — HEPARIN SODIUM (PORCINE) 1000 UNIT/ML IJ SOLN
INTRAMUSCULAR | Status: AC
  Filled 2022-09-24: qty 1

## 2022-09-24 MED ORDER — LACTATED RINGERS IV SOLN: INTRAVENOUS | Status: DC | PRN

## 2022-09-24 MED ORDER — FUROSEMIDE 10 MG/ML IJ SOLN
INTRAMUSCULAR | Status: DC | PRN
  Administered 2022-09-24: 40 mg via INTRAMUSCULAR

## 2022-09-24 MED ORDER — SODIUM CHLORIDE 0.45 % IV SOLN: INTRAVENOUS | Status: DC | PRN

## 2022-09-24 MED ORDER — SODIUM CHLORIDE 0.9% FLUSH
10.0000 mL | Freq: Two times a day (BID) | INTRAVENOUS | Status: DC
  Administered 2022-09-25 – 2022-09-29 (×9): 10 mL
  Administered 2022-09-30: 20 mL
  Administered 2022-09-30 – 2022-10-04 (×9): 10 mL

## 2022-09-24 MED ORDER — VANCOMYCIN HCL 1000 MG IV SOLR
INTRAVENOUS | Status: AC
  Filled 2022-09-24: qty 40

## 2022-09-24 MED ORDER — OXYCODONE HCL 5 MG PO TABS
5.0000 mg | ORAL_TABLET | ORAL | Status: DC | PRN
  Administered 2022-09-24 – 2022-09-26 (×7): 10 mg via ORAL
  Filled 2022-09-24 (×7): qty 2

## 2022-09-24 MED ORDER — MORPHINE SULFATE (PF) 2 MG/ML IV SOLN
1.0000 mg | INTRAVENOUS | Status: DC | PRN
  Administered 2022-09-24: 2 mg via INTRAVENOUS
  Administered 2022-09-24: 4 mg via INTRAVENOUS
  Administered 2022-09-25 – 2022-09-26 (×2): 2 mg via INTRAVENOUS
  Administered 2022-09-26 (×2): 4 mg via INTRAVENOUS
  Administered 2022-09-26 (×2): 2 mg via INTRAVENOUS
  Filled 2022-09-24 (×4): qty 1
  Filled 2022-09-24 (×2): qty 2
  Filled 2022-09-24: qty 1
  Filled 2022-09-24: qty 2

## 2022-09-24 MED ORDER — FAMOTIDINE IN NACL 20-0.9 MG/50ML-% IV SOLN
20.0000 mg | Freq: Two times a day (BID) | INTRAVENOUS | Status: AC
  Administered 2022-09-24 – 2022-09-25 (×2): 20 mg via INTRAVENOUS
  Filled 2022-09-24 (×2): qty 50

## 2022-09-24 MED ORDER — ACETAMINOPHEN 160 MG/5ML PO SOLN
1000.0000 mg | Freq: Four times a day (QID) | ORAL | Status: DC
  Administered 2022-09-25 (×3): 1000 mg
  Filled 2022-09-24 (×3): qty 40.6

## 2022-09-24 MED ORDER — BISACODYL 5 MG PO TBEC
10.0000 mg | DELAYED_RELEASE_TABLET | Freq: Every day | ORAL | Status: DC
  Administered 2022-09-25: 10 mg via ORAL
  Filled 2022-09-24: qty 2

## 2022-09-24 MED ORDER — ORAL CARE MOUTH RINSE: 15.0000 mL | OROMUCOSAL | Status: DC | PRN

## 2022-09-24 MED ORDER — DEXMEDETOMIDINE HCL IN NACL 400 MCG/100ML IV SOLN
0.0000 ug/kg/h | INTRAVENOUS | Status: DC
  Administered 2022-09-24 – 2022-09-25 (×2): 0.7 ug/kg/h via INTRAVENOUS
  Filled 2022-09-24 (×2): qty 100

## 2022-09-24 MED ORDER — SODIUM CHLORIDE 0.9% FLUSH: 10.0000 mL | INTRAVENOUS | Status: DC | PRN

## 2022-09-24 MED ORDER — ALBUMIN HUMAN 5 % IV SOLN: INTRAVENOUS | Status: DC | PRN

## 2022-09-24 MED ORDER — ORAL CARE MOUTH RINSE
15.0000 mL | OROMUCOSAL | Status: DC
  Administered 2022-09-25 (×6): 15 mL via OROMUCOSAL

## 2022-09-24 MED ORDER — SODIUM CHLORIDE 0.9 % IV SOLN
600.0000 mg | Freq: Once | INTRAVENOUS | Status: AC
  Administered 2022-09-25: 600 mg via INTRAVENOUS
  Filled 2022-09-24: qty 10

## 2022-09-24 MED ORDER — BUPIVACAINE HCL (PF) 0.5 % IJ SOLN
INTRAMUSCULAR | Status: AC
  Filled 2022-09-24: qty 30

## 2022-09-24 MED ORDER — MIDAZOLAM HCL 2 MG/2ML IJ SOLN
2.0000 mg | INTRAMUSCULAR | Status: DC | PRN
  Administered 2022-09-24: 2 mg via INTRAVENOUS
  Filled 2022-09-24: qty 2

## 2022-09-24 MED ORDER — VANCOMYCIN HCL IN DEXTROSE 1-5 GM/200ML-% IV SOLN
1000.0000 mg | Freq: Two times a day (BID) | INTRAVENOUS | Status: AC
  Administered 2022-09-24 – 2022-09-26 (×3): 1000 mg via INTRAVENOUS
  Filled 2022-09-24 (×3): qty 200

## 2022-09-24 MED ORDER — BISACODYL 10 MG RE SUPP: 10.0000 mg | Freq: Every day | RECTAL | Status: DC

## 2022-09-24 MED ORDER — ACETAMINOPHEN 160 MG/5ML PO SOLN: 650.0000 mg | Freq: Once | ORAL | Status: AC

## 2022-09-24 MED ORDER — PROPOFOL 10 MG/ML IV BOLUS
INTRAVENOUS | Status: AC
  Filled 2022-09-24: qty 20

## 2022-09-24 MED ORDER — FENTANYL CITRATE (PF) 250 MCG/5ML IJ SOLN
INTRAMUSCULAR | Status: DC | PRN
  Administered 2022-09-24: 100 ug via INTRAVENOUS
  Administered 2022-09-24 (×2): 50 ug via INTRAVENOUS
  Administered 2022-09-24: 100 ug via INTRAVENOUS
  Administered 2022-09-24 (×2): 50 ug via INTRAVENOUS

## 2022-09-24 MED ORDER — VANCOMYCIN HCL 1000 MG IV SOLR
INTRAVENOUS | Status: AC
  Filled 2022-09-24: qty 20

## 2022-09-24 MED ORDER — ONDANSETRON HCL 4 MG/2ML IJ SOLN
4.0000 mg | Freq: Four times a day (QID) | INTRAMUSCULAR | Status: DC | PRN
  Administered 2022-09-25 – 2022-10-01 (×14): 4 mg via INTRAVENOUS
  Filled 2022-09-24 (×14): qty 2

## 2022-09-24 MED ORDER — ASPIRIN 325 MG PO TBEC
325.0000 mg | DELAYED_RELEASE_TABLET | Freq: Every day | ORAL | Status: DC
  Administered 2022-09-26 – 2022-09-27 (×2): 325 mg via ORAL
  Filled 2022-09-24 (×2): qty 1

## 2022-09-24 MED ORDER — POTASSIUM CHLORIDE 10 MEQ/50ML IV SOLN
10.0000 meq | INTRAVENOUS | Status: AC
  Administered 2022-09-24 – 2022-09-25 (×3): 10 meq via INTRAVENOUS
  Filled 2022-09-24 (×3): qty 50

## 2022-09-24 MED ORDER — MIDAZOLAM HCL (PF) 10 MG/2ML IJ SOLN
INTRAMUSCULAR | Status: AC
  Filled 2022-09-24: qty 2

## 2022-09-24 MED ORDER — SODIUM CHLORIDE 0.9 % IV SOLN: INTRAVENOUS | Status: DC | PRN

## 2022-09-24 MED ORDER — FLUCONAZOLE IN SODIUM CHLORIDE 400-0.9 MG/200ML-% IV SOLN
400.0000 mg | Freq: Once | INTRAVENOUS | Status: AC
  Administered 2022-09-25: 400 mg via INTRAVENOUS
  Filled 2022-09-24: qty 200

## 2022-09-24 MED ORDER — SODIUM CHLORIDE 0.9% FLUSH: 3.0000 mL | INTRAVENOUS | Status: DC | PRN

## 2022-09-24 MED ORDER — POTASSIUM CHLORIDE 10 MEQ/50ML IV SOLN
10.0000 meq | INTRAVENOUS | Status: AC
  Administered 2022-09-24 (×3): 10 meq via INTRAVENOUS

## 2022-09-24 MED ORDER — LACTATED RINGERS IV SOLN: 500.0000 mL | Freq: Once | INTRAVENOUS | Status: DC | PRN

## 2022-09-24 SURGICAL SUPPLY — 125 items
ADAPTER CARDIO PERF ANTE/RETRO (ADAPTER) IMPLANT
ANCHOR CATH FOLEY SECURE (MISCELLANEOUS) IMPLANT
APPLIER CLIP 11 MED OPEN (CLIP) ×2
APPLIER CLIP 9.375 MED OPEN (MISCELLANEOUS) ×4
APPLIER CLIP 9.375 SM OPEN (CLIP) ×2
BAG DECANTER FOR FLEXI CONT (MISCELLANEOUS) ×2 IMPLANT
BLADE CLIPPER SURG (BLADE) ×2 IMPLANT
BLADE STERNUM SYSTEM 6 (BLADE) ×2 IMPLANT
BLADE SURG 11 STRL SS (BLADE) ×2 IMPLANT
BLADE SURG 15 STRL LF DISP TIS (BLADE) IMPLANT
BLADE SURG 15 STRL SS (BLADE)
BOOT SUTURE AID YELLOW STND (SUTURE) IMPLANT
CANISTER SUCT 3000ML PPV (MISCELLANEOUS) ×2 IMPLANT
CANNULA AORTIC ROOT 9FR (CANNULA) IMPLANT
CANNULA ARTERIAL NVNT 3/8 20FR (MISCELLANEOUS) ×2 IMPLANT
CANNULA CARDIOPLEGIA 14FRX32 (CANNULA) IMPLANT
CANNULA CARDIOPLEGIA 15FR (CANNULA) ×2 IMPLANT
CANNULA EZ GLIDE AORTIC 21FR (CANNULA) IMPLANT
CANNULA MC2 2 STG 29/37 NON-V (CANNULA) ×2 IMPLANT
CANNULA MC2 TWO STAGE (CANNULA) ×2
CANNULA SUMP PERICARDIAL (CANNULA) ×2 IMPLANT
CANNULA VENOUS LOW PROF 34X46 (CANNULA) ×2 IMPLANT
CATH HEART VENT LEFT (CATHETERS) IMPLANT
CATH ROBINSON RED A/P 18FR (CATHETERS) ×4 IMPLANT
CATH THOR STR 32F SOFT 20 RADI (CATHETERS) IMPLANT
CATH THORACIC 28FR (CATHETERS) ×2 IMPLANT
CATH THORACIC 28FR RT ANG (CATHETERS) ×2 IMPLANT
CATH THORACIC 36FR RT ANG (CATHETERS) IMPLANT
CLIP APPLIE 11 MED OPEN (CLIP) IMPLANT
CLIP APPLIE 9.375 MED OPEN (MISCELLANEOUS) ×2 IMPLANT
CLIP APPLIE 9.375 SM OPEN (CLIP) IMPLANT
CNTNR URN SCR LID CUP LEK RST (MISCELLANEOUS) IMPLANT
CONN ST 1/4X3/8  BEN (MISCELLANEOUS) ×4
CONN ST 1/4X3/8 BEN (MISCELLANEOUS) IMPLANT
CONT SPEC 4OZ STRL OR WHT (MISCELLANEOUS) ×2
DERMABOND ADVANCED .7 DNX12 (GAUZE/BANDAGES/DRESSINGS) IMPLANT
DEV SUMP PERICARDIAL 20F 15IN (MISCELLANEOUS) ×2 IMPLANT
DRAIN CHANNEL 24FR (DRAIN) ×4 IMPLANT
DRAPE INCISE IOBAN 66X45 STRL (DRAPES) ×4 IMPLANT
DRAPE PERI GROIN 82X75IN TIB (DRAPES) ×2 IMPLANT
DRAPE WARM FLUID 44X44 (DRAPES) ×2 IMPLANT
DRSG AQUACEL AG ADV 3.5X14 (GAUZE/BANDAGES/DRESSINGS) IMPLANT
ELECT BLADE 4.0 EZ CLEAN MEGAD (MISCELLANEOUS) ×2
ELECT CAUTERY BLADE 6.4 (BLADE) IMPLANT
ELECT REM PT RETURN 9FT ADLT (ELECTROSURGICAL) ×4
ELECTRODE BLDE 4.0 EZ CLN MEGD (MISCELLANEOUS) ×2 IMPLANT
ELECTRODE REM PT RTRN 9FT ADLT (ELECTROSURGICAL) ×4 IMPLANT
FELT TEFLON 1X6 (MISCELLANEOUS) IMPLANT
FELT TEFLON 6X6 (MISCELLANEOUS) ×2 IMPLANT
GAUZE 4X4 16PLY ~~LOC~~+RFID DBL (SPONGE) IMPLANT
GAUZE SPONGE 4X4 12PLY STRL (GAUZE/BANDAGES/DRESSINGS) ×4 IMPLANT
GLOVE BIO SURGEON STRL SZ 6 (GLOVE) IMPLANT
GLOVE BIO SURGEON STRL SZ 6.5 (GLOVE) IMPLANT
GLOVE BIOGEL PI IND STRL 6.5 (GLOVE) ×4 IMPLANT
GLOVE BIOGEL PI IND STRL 7.0 (GLOVE) IMPLANT
GLOVE SURG SS PI 6.0 STRL IVOR (GLOVE) IMPLANT
GLOVE SURG SS PI 7.5 STRL IVOR (GLOVE) IMPLANT
GOWN STRL REUS W/ TWL LRG LVL3 (GOWN DISPOSABLE) ×8 IMPLANT
GOWN STRL REUS W/ TWL XL LVL3 (GOWN DISPOSABLE) ×8 IMPLANT
GOWN STRL REUS W/TWL LRG LVL3 (GOWN DISPOSABLE) ×12
GOWN STRL REUS W/TWL XL LVL3 (GOWN DISPOSABLE) ×10
HEMOSTAT SURGICEL 2X14 (HEMOSTASIS) IMPLANT
HEMOSTAT SURGICEL NU-KNIT 6X9 (HEMOSTASIS) ×2 IMPLANT
INSERT FOGARTY XLG (MISCELLANEOUS) ×2 IMPLANT
IV ADAPTER SYR DOUBLE MALE LL (MISCELLANEOUS) IMPLANT
KIT BASIN OR (CUSTOM PROCEDURE TRAY) ×2 IMPLANT
KIT LVAD HEARTMATE 3 W-CNTRL (Prosthesis & Implant Heart) IMPLANT
KIT SUCTION CATH 14FR (SUCTIONS) ×2 IMPLANT
KIT TURNOVER KIT B (KITS) ×2 IMPLANT
LEAD PACING MYOCARDI (MISCELLANEOUS) IMPLANT
LINE VENT (MISCELLANEOUS) IMPLANT
MARKER SKIN DUAL TIP RULER LAB (MISCELLANEOUS) ×2 IMPLANT
NDL HYPO 22X1.5 SAFETY MO (MISCELLANEOUS) ×2 IMPLANT
NEEDLE HYPO 22X1.5 SAFETY MO (MISCELLANEOUS) ×2 IMPLANT
NEEDLE SAFETY HYPO 22GAX1.5 (MISCELLANEOUS) ×2
NS IRRIG 1000ML POUR BTL (IV SOLUTION) ×10 IMPLANT
PACK OPEN HEART (CUSTOM PROCEDURE TRAY) ×2 IMPLANT
PAD ARMBOARD 7.5X6 YLW CONV (MISCELLANEOUS) ×4 IMPLANT
PAD DEFIB R2 (MISCELLANEOUS) ×2 IMPLANT
PAD ELECT DEFIB RADIOL ZOLL (MISCELLANEOUS) IMPLANT
PENCIL BUTTON HOLSTER BLD 10FT (ELECTRODE) IMPLANT
POSITIONER HEAD DONUT 9IN (MISCELLANEOUS) ×2 IMPLANT
PUNCH AORTIC ROTATE 4.5MM 8IN (MISCELLANEOUS) ×2 IMPLANT
SET CARDIO DLP MULTI-PER 4-LEG (CARDIAC RHYTHM DISPOSABLE) IMPLANT
SET MPS 3-ND DEL (MISCELLANEOUS) IMPLANT
SOL PREP POV-IOD 4OZ 10% (MISCELLANEOUS) IMPLANT
SOL SCRUB PVP POV-IOD 4OZ 7.5% (MISCELLANEOUS) ×4
SOLUTION SCRB POV-IOD 4OZ 7.5% (MISCELLANEOUS) IMPLANT
SPONGE T-LAP 18X18 ~~LOC~~+RFID (SPONGE) IMPLANT
STOPCOCK 4 WAY LG BORE MALE ST (IV SETS) IMPLANT
SUT ETHIBOND 2 0 SH (SUTURE) ×8
SUT ETHIBOND 2 0 SH 36X2 (SUTURE) ×6 IMPLANT
SUT ETHIBOND NAB MH 2-0 36IN (SUTURE) ×12 IMPLANT
SUT MNCRL AB 3-0 PS2 18 (SUTURE) ×4 IMPLANT
SUT PROLENE 1 XLH (SUTURE) ×4 IMPLANT
SUT PROLENE 1 XLH 60 (SUTURE) IMPLANT
SUT PROLENE 4 0 RB 1 (SUTURE) ×24
SUT PROLENE 4-0 RB1 .5 CRCL 36 (SUTURE) ×8 IMPLANT
SUT PROLENE 5 0 C1 (SUTURE) IMPLANT
SUT SILK  1 MH (SUTURE) ×24
SUT SILK 1 MH (SUTURE) ×24 IMPLANT
SUT SILK 1 TIES 10X30 (SUTURE) ×2 IMPLANT
SUT SILK 2 0 SH CR/8 (SUTURE) ×4 IMPLANT
SUT STEEL 6MS V (SUTURE) IMPLANT
SUT STEEL SZ 6 DBL 3X14 BALL (SUTURE) IMPLANT
SUT VIC AB 1 CTX 27 (SUTURE) IMPLANT
SUT VIC AB 1 CTX 36 (SUTURE) ×4
SUT VIC AB 1 CTX36XBRD ANBCTR (SUTURE) ×4 IMPLANT
SUT VIC AB 2-0 CTX 27 (SUTURE) ×4 IMPLANT
SUT VIC AB 2-0 CTX 36 (SUTURE) IMPLANT
SUT VIC AB 3-0 X1 27 (SUTURE) ×2 IMPLANT
SYR 10ML KIT SKIN ADHESIVE (MISCELLANEOUS) ×2 IMPLANT
SYSTEM SAHARA CHEST DRAIN ATS (WOUND CARE) ×4 IMPLANT
TAPE CLOTH SURG 4X10 WHT LF (GAUZE/BANDAGES/DRESSINGS) IMPLANT
TOWEL GREEN STERILE (TOWEL DISPOSABLE) ×2 IMPLANT
TOWEL GREEN STERILE FF (TOWEL DISPOSABLE) IMPLANT
TRAY CATH LUMEN 1 20CM STRL (SET/KITS/TRAYS/PACK) ×2 IMPLANT
TRAY FOLEY SLVR 16FR TEMP STAT (SET/KITS/TRAYS/PACK) ×2 IMPLANT
TUBE CONNECTING 12X1/4 (SUCTIONS) ×2 IMPLANT
TUBE SUCT INTRACARD DLP 20F (MISCELLANEOUS) IMPLANT
TUBING ART PRESS 48 MALE/FEM (TUBING) IMPLANT
UNDERPAD 30X36 HEAVY ABSORB (UNDERPADS AND DIAPERS) ×2 IMPLANT
VENT LEFT HEART 12002 (CATHETERS) ×2
WATER STERILE IRR 1000ML POUR (IV SOLUTION) ×4 IMPLANT
YANKAUER SUCT BULB TIP NO VENT (SUCTIONS) ×2 IMPLANT

## 2022-09-24 NOTE — Progress Notes (Addendum)
Brief Nutrition Note:   Dietitian Consult received for LVAD evaluation. Pt currently in OR for LVAD placement. LOS day 2 (<48 hours).   Noted recorded po intake 100% of all 3 meal trays on Heart Healthy Carb Mod Diet with 1800 mL fluid restriction.   Currently NPO  Noted pt assessed on last admission on 09/02/22 by Inpatient RD. Appetite prior to that admission had been good with pt eating well at home but not liking the hospital food and wife bringing pt food from cafeteria. Diet was liberalized and RD discussed possible oral nutrition supplements pending appetite, especially post immediate LVAD placement  Pt not available at this time for RD complete initial assessment-out of room in OR. RD to follow-up and perform initial assessment.   Kerman Passey MS, RDN, LDN, CNSC Registered Dietitian 3 Clinical Nutrition RD Pager and On-Call Pager Number Located in [place]

## 2022-09-24 NOTE — Anesthesia Procedure Notes (Signed)
Central Venous Catheter Insertion Performed by: Effie Berkshire, MD, anesthesiologist Start/End2/14/2024 7:55 AM, 09/24/2022 8:00 AM Patient location: Pre-op. Preanesthetic checklist: patient identified, IV checked, site marked, risks and benefits discussed, surgical consent, monitors and equipment checked, pre-op evaluation, timeout performed and anesthesia consent Hand hygiene performed  and maximum sterile barriers used  PA cath was placed.Swan type:thermodilution Procedure performed without using ultrasound guided technique. Attempts: 1 Patient tolerated the procedure well with no immediate complications.

## 2022-09-24 NOTE — Progress Notes (Signed)
Advanced Heart Failure Rounding Note  PCP-Cardiologist: [name], MD AHF: [name]   Patient Profile   72 y/o male w/ long standing h/o chronic systolic heart failure due to nonischemic cardiomyopathy, T2DM and hx of acute promyelocytic leukemia s/p ATRA ([facility] 2014). Recent development of rapidly progressive heart failure,  NYHA class IIIb-IV symptoms. Admitted for for RHC and optimization prior to VAD implant.  RHC w/ elevated filling pressures and low output (RA 13, PAP 64/26 (40), PCWP 23, CI 2 L/min/m2) =>started on Milrinone  Subjective:    - S/P HMIII LVAD today; POD #0 - PA pressures 47/19, CVP 11, MAP 78 on low dose epi, nitric 20 ppm, levophed and milrinone 0.38mg/kg/min.   Objective:   Weight Range: 82.1 kg Body mass index is 26.73 kg/m.   Vital Signs:   Temp:  [97 F (36.1 C)-98.6 F (37 C)] 98.1 F (36.7 C) (02/14 1915) Pulse Rate:  [75-98] 82 (02/14 1915) Resp:  [12-26] 20 (02/14 1915) BP: (91-131)/(50-105) 97/82 (02/14 1900) SpO2:  [89 %-100 %] 93 % (02/14 1915) Arterial Line BP: (78-104)/(62-73) 90/66 (02/14 1915) FiO2 (%):  [50 %] 50 % (02/14 1543) Weight:  [82.1 kg] 82.1 kg (02/14 0451) Last BM Date : 09/23/22  Weight change: Filed Weights   09/22/22 1508 09/23/22 0500 09/24/22 0451  Weight: 82.6 kg 85.4 kg 82.1 kg    Intake/Output:   Intake/Output Summary (Last 24 hours) at 09/24/2022 1944 Last data filed at 09/24/2022 1900 Gross per 24 hour  Intake 5082.4 ml  Output 5120 ml  Net -37.6 ml       Physical Exam     General:  intubated/sedated.  HEENT: Normal Neck: ET tube in place.  Cor: PMI nondisplaced. Regular rate & rhythm. No rubs, gallops or murmurs. Lungs: Clear Abdomen: Soft, nontender, nondistended. No hepatosplenomegaly. No bruits or masses. Good bowel sounds. Extremities: No cyanosis, clubbing, rash, edema Neuro: intubated/sedated.    Telemetry   NSR w  EKG    N/A   Labs    CBC Recent Labs     09/24/22 0324 09/24/22 0431 09/24/22 1301 09/24/22 1335 09/24/22 1338 09/24/22 1534  WBC 5.5  --   --   --   --  15.4*  HGB 13.4   < > 9.0*   < > 9.9* 10.4*  HCT 38.5*   < > 27.0*   < > 29.0* 30.3*  MCV 90.4  --   --   --   --  91.5  PLT 188  --  139*  --   --  132*  132*   < > = values in this interval not displayed.    Basic Metabolic Panel Recent Labs    09/24/22 0324 09/24/22 0431 09/24/22 1338 09/24/22 1534  NA 136   < > 141 138  K 3.8   < > 3.5 3.4*  CL 101   < > 102 103  CO2 26  --   --  25  GLUCOSE 122*   < > 193* 133*  BUN 22   < > 24* 19  CREATININE 1.14   < > 1.00 1.17  CALCIUM 9.2  --   --  8.7*  MG 2.0  --   --  2.0   < > = values in this interval not displayed.    Liver Function Tests Recent Labs    09/23/22 0416  AST 19  ALT 24  ALKPHOS 66  BILITOT 1.0  PROT 6.4*  ALBUMIN  3.3*    No results for input(s): "LIPASE", "AMYLASE" in the last 72 hours. Cardiac Enzymes No results for input(s): "CKTOTAL", "CKMB", "CKMBINDEX", "TROPONINI" in the last 72 hours.  BNP: BNP (last 3 results) Recent Labs    08/19/22 1256 09/01/22 1817 09/19/22 1330  BNP 1,916.6* 2,022.4* 1,954.3*     ProBNP (last 3 results) No results for input(s): "PROBNP" in the last 8760 hours.   D-Dimer Recent Labs    09/24/22 1534  DDIMER 0.96*   Hemoglobin A1C Recent Labs    09/24/22 0324  HGBA1C 7.0*   Fasting Lipid Panel No results for input(s): "CHOL", "HDL", "LDLCALC", "TRIG", "CHOLHDL", "LDLDIRECT" in the last 72 hours. Thyroid Function Tests No results for input(s): "TSH", "T4TOTAL", "T3FREE", "THYROIDAB" in the last 72 hours.  Invalid input(s): "FREET3"  Other results:   Imaging    DG Chest Port 1 View  Result Date: 09/24/2022 CLINICAL DATA:  Heart failure EXAM: PORTABLE CHEST 1 VIEW COMPARISON:  Chest x-ray 09/23/2022 FINDINGS: There are new sternotomy wires. Endotracheal tube tip is 4.7 cm above the carina. Swan-Ganz catheter tip is at the  level of the main pulmonary artery. Enteric tube extends below the diaphragm. New LVAD in place. The heart is enlarged. There central pulmonary vascular congestion. There is no focal lung infiltrate, pleural effusion or pneumothorax. No acute fractures are seen. IMPRESSION: 1. Cardiomegaly with central pulmonary vascular congestion. 2. Support lines and tubes as above. Electronically Signed   By: Ronney Asters M.D.   On: 09/24/2022 16:02   ECHO INTRAOPERATIVE TEE  Result Date: 09/24/2022  *INTRAOPERATIVE TRANSESOPHAGEAL REPORT *  Patient Name:   KASTIN ZAMBO Date of Exam: 09/24/2022 Medical Rec #:  [id number]        Height:       69.0 in Accession #:    [id number]       Weight:       181.0 lb Date of Birth:  Mar 25, 1951        BSA:          1.98 m Patient Age:    89 years         BP:           110/75 mmHg Patient Gender: M                HR:           70 bpm. Exam Location:  Anesthesiology Transesophogeal exam was perform intraoperatively during surgical procedure. Patient was closely monitored under general anesthesia during the entirety of examination. Indications:     Heart Failure Performing Phys: Suella Broad MD PROCEDURE: Intraoperative Transesophogeal LVAD. Complications: No known complications during this procedure.            POST-OP IMPRESSIONS s/p LVAD placement & Aortic Valve closure _ Left Ventricle: LVAD in place. Inflow cannula in good position. No bowing or suction events noted. _ Right Ventricle: Improved RV function with inotrope support. _ Aorta: No dissection noted. _ Left Atrium: The left atrium appears unchanged from pre-bypass. _ Left Atrial Appendage: The left atrial appendage appears unchanged from pre-bypass. _ Aortic Valve: No significant insufficiency noted by color doppler. Leaflets fixed in the closed position. _ Mitral Valve: The mitral valve appears unchanged from pre-bypass. _ Tricuspid Valve: The tricuspid valve appears unchanged from pre-bypass. _ Pulmonic Valve: The pulmonic  valve appears unchanged from pre-bypass. _ Interatrial Septum: The interatrial septum appears unchanged from pre-bypass. _ Interventricular Septum: The interventricular septum appears unchanged from pre-bypass. _  Pericardium: No pericardial effusion is present.  - Left Ventricular Assist Device; Ramp Study: The aortic valve does not appear to open, consistent with appropriate LVAD function and no significant native left ventricular ejection. The inflow cannula was visualized. LVAD appropriately decompressed. LVAD function appears to be normal. PRE-OP FINDINGS  Left Ventricle: The left ventricle ejection fraction is 15 - 20%. The cavity size was moderately dilated. Left ventricular severe diffuse hypokinesis. There is no left ventricular hypertrophy. Left ventricular diastolic function was not evaluated. Right Ventricle: The right ventricle has moderatly reduced systolic function. The cavity was dilated. There is no increase in right ventricular wall thickness. Right ventricular systolic pressure is moderately elevated. Catheter present in the right ventricle. There is no aneurysm seen. Left Atrium: Left atrial size was dilated. No left atrial/left atrial appendage thrombus was detected. The left atrial appendage is well visualized and there is no evidence of thrombus present. Left atrial appendage velocity is reduced at less than 40 cm/s. Right Atrium: Right atrial size is dilated in size. Catheter present in the right atrium. Interatrial Septum: No atrial level shunt detected by color flow Doppler. Agitated saline contrast bubble study was negative, with no evidence of any interatrial shunt. There is no evidence of a patent foramen ovale. Pericardium: A small pericardial effusion is present. The pericardial effusion is circumferential. There is no pleural effusion. Mitral Valve: The mitral valve is normal in structure. Mitral valve regurgitation is mild to moderate by color flow Doppler. The MR jet is  centrally-directed. There is no evidence of mitral valve vegetation. There is no evidence of mitral stenosis. Tricuspid Valve: The tricuspid valve was normal in structure. Tricuspid valve regurgitation is mild by color flow Doppler. The jet is directed centrally. No evidence of tricuspid stenosis is present. There is no evidence of tricuspid valve vegetation. Aortic Valve: The aortic valve is tricuspid with moderate aortic regurgitation by color flow Doppler. The jet is centrally-directed. There is no stenosis of the aortic valve. There is no evidence of aortic valve vegetation. Pulmonic Valve: The pulmonic valve was normal in structure, with normal leaflet motion. No evidence of pumonic stenosis. Pulmonic valve regurgitation is mild by color flow Doppler. Aorta: The aortic root and ascending aorta are normal in size and structure. There is evidence of plaque in the aortic arch and descending aorta; Grade I, measuring 1-67m in size. Pulmonary Artery: SGordy Councilmancatheter present on the right. The pulmonary artery is of normal size. Pulmonary hypertension is moderate. Venous: The inferior vena cava was not well visualized. Shunts: There is no evidence of an atrial septal defect. +-------------+--------++ AORTIC VALVE          +-------------+--------++ AV Mean Grad:3.0 mmHg +-------------+--------++  KSuella BroadMD Electronically signed by KSuella BroadMD Signature Date/Time: 09/24/2022/3:47:55 PM    Final    EP STUDY  Result Date: 09/24/2022 See surgical note for result.    Medications:     Scheduled Medications:  sodium chloride   Intravenous Once   sodium chloride   Intravenous Once   [START ON 09/25/2022] acetaminophen  1,000 mg Oral Q6H   Or   [START ON 09/25/2022] acetaminophen (TYLENOL) oral liquid 160 mg/5 mL  1,000 mg Per Tube Q6H   [START ON 09/25/2022] aspirin EC  325 mg Oral Daily   Or   [START ON 09/25/2022] aspirin  324 mg Per Tube Daily   Or   [START ON 09/25/2022] aspirin  300 mg  Rectal Daily   atorvastatin  40 mg  Oral Daily   [START ON 09/25/2022] bisacodyl  10 mg Oral Daily   Or   [START ON 09/25/2022] bisacodyl  10 mg Rectal Daily   Chlorhexidine Gluconate Cloth  6 each Topical Daily   digoxin  0.0625 mg Oral Daily   [START ON 09/25/2022] docusate sodium  200 mg Oral Daily   [START ON 09/26/2022] pantoprazole  40 mg Oral Daily   polyethylene glycol  17 g Oral Daily   [START ON 09/25/2022] sodium chloride flush  3 mL Intravenous Q12H   vancomycin (VANCOCIN) 1,000 mg in sodium chloride 0.9 % 1,000 mL irrigation   Irrigation To OR    Infusions:  sodium chloride Stopped (09/24/22 1652)   [START ON 09/25/2022] sodium chloride     sodium chloride 10 mL/hr at 09/24/22 1800   albumin human     amiodarone      ceFAZolin (ANCEF) IV 2 g (09/24/22 1849)   dexmedetomidine (PRECEDEX) IV infusion 0.7 mcg/kg/hr (09/24/22 1810)   epinephrine 5 mcg/min (09/24/22 1800)   famotidine (PEPCID) IV Stopped (09/24/22 1642)   [START ON 09/25/2022] fluconazole (DIFLUCAN) IV     insulin 2.2 mL/hr at 09/24/22 1800   lactated ringers     lactated ringers     lactated ringers 10 mL/hr at 09/24/22 1800   magnesium sulfate 20 mL/hr at 09/24/22 1800   milrinone 0.125 mcg/kg/min (09/24/22 1838)   norepinephrine (LEVOPHED) Adult infusion 13 mcg/min (09/24/22 1800)   [START ON 09/25/2022] rifampin (RIFADIN) 600 mg in sodium chloride 0.9 % 100 mL IVPB     vancomycin      PRN Medications: sodium chloride, acetaminophen, albumin human, albuterol, dextrose, lactated ringers, midazolam, morphine injection, ondansetron (ZOFRAN) IV, mouth rinse, oxyCODONE, [START ON 09/25/2022] sodium chloride flush, traMADol, traZODone    Patient Profile   72 y/o male w/ long standing h/o chronic systolic heart failure due to nonischemic cardiomyopathy, T2DM and hx of acute promyelocytic leukemia s/p ATRA ([facility] 2014). Recent development of rapidly progressive heart failure,  NYHA class IIIb-IV symptoms. Admitted  for for RHC and optimization prior to VAD implant.   Assessment/Plan   1. Chronic systolic heart failure, NYHA III, INTERMACS 4 S/P HMIII LVAD (09/24/22):  - HMIII LVAD today with no immediate post-operative complications - Hemodynamics: PA 47/19, CVP 11, MAP 78 on low dose epi, levophed, nitric 20ppm and milrinone 0.60mg/kg/min. PAPi 2.5, CBE:[id number].  - Continue RV afterload reduction w/ nitric tonight, will start to wean within the next 12hours. Will plan to start weaning pressors tomorrow if hemodynamics permit.  - LVAD speed 5100, flow 3.9 L/min, PI 2.4, power 3.5 2. H/o NSVT/PVCs  - Continue amiodarone 2068mBID.  3. [facility]. Hx of endocarditis: 2015   Length of Stay: 2  Lorriane Dehart, DO  09/24/2022, 7:44 PM  Advanced Heart Failure Team Pager [phone]-F; 7a - 5p)  Please contact [facility] for night-coverage after hours (5p -7a ) and weekends on [email or web address]  CRITICAL CARE Performed by: AdHebert Soho Total critical care time: 35 minutes  Critical care time was exclusive of separately billable procedures and treating other patients.  Critical care was necessary to treat or prevent imminent or life-threatening deterioration.  Critical care was time spent personally by me on the following activities: development of treatment plan with patient and/or surrogate as well as nursing, discussions with consultants, evaluation of patient's response to treatment, examination of patient, obtaining history from patient or  surrogate, ordering and performing treatments and interventions, ordering and review of laboratory studies, ordering and review of radiographic studies, pulse oximetry and re-evaluation of patient's condition.

## 2022-09-24 NOTE — Anesthesia Preprocedure Evaluation (Signed)
Anesthesia Evaluation  Patient identified by MRN, date of birth, ID band Patient awake    Reviewed: Allergy & Precautions, NPO status , Patient's Chart, lab work & pertinent test results  Airway Mallampati: II  TM Distance: >3 FB Neck ROM: Full    Dental  (+) Teeth Intact, Dental Advisory Given   Pulmonary former smoker   breath sounds clear to auscultation       Cardiovascular hypertension, + angina  + CAD, +CHF and + DOE   Rhythm:Regular Rate:Normal  Echo: 1. Left ventricular ejection fraction, by estimation, is 25%. Left  ventricular ejection fraction by 2D MOD biplane is 27.6 %. The left  ventricle has severely decreased function. The left ventricle demonstrates  global hypokinesis. The left ventricular  internal cavity size was moderately to severely dilated. Left ventricular  diastolic parameters are consistent with Grade II diastolic dysfunction  (pseudonormalization).   2. Right ventricular systolic function is moderately reduced. The right  ventricular size is mildly enlarged.   3. Left atrial size was mildly dilated.   4. Right atrial size was moderately dilated.   5. The mitral valve is normal in structure. Mild to moderate mitral valve  regurgitation. No evidence of mitral stenosis.   6. The aortic valve is normal in structure. Aortic valve regurgitation is  moderate. No aortic stenosis is present. Aortic regurgitation PHT measures  226 msec. Aortic valve area, by VTI measures 2.51 cm. Aortic valve mean  gradient measures 3.0 mmHg.  Aortic valve Vmax measures 1.07 m/s.   7. There is borderline dilatation of the ascending aorta, measuring 37  mm.   8. The inferior vena cava is dilated in size with >50% respiratory  variability, suggesting right atrial pressure of 8 mmHg.     Neuro/Psych negative neurological ROS  negative psych ROS   GI/Hepatic Neg liver ROS, PUD,GERD  ,,  Endo/Other  diabetes     Renal/GU negative Renal ROS     Musculoskeletal  (+) Arthritis ,    Abdominal   Peds  Hematology negative hematology ROS (+)   Anesthesia Other Findings   Reproductive/Obstetrics                             Anesthesia Physical Anesthesia Plan  ASA: 4  Anesthesia Plan: General   Post-op Pain Management:    Induction: Intravenous  PONV Risk Score and Plan: 2 and Ondansetron and Midazolam  Airway Management Planned: Oral ETT and Video Laryngoscope Planned  Additional Equipment: Arterial line, CVP, PA Cath, TEE and Ultrasound Guidance Line Placement  Intra-op Plan:   Post-operative Plan: Post-operative intubation/ventilation  Informed Consent: I have reviewed the patients History and Physical, chart, labs and discussed the procedure including the risks, benefits and alternatives for the proposed anesthesia with the patient or authorized representative who has indicated his/her understanding and acceptance.       Plan Discussed with: CRNA  Anesthesia Plan Comments:        Anesthesia Quick Evaluation

## 2022-09-24 NOTE — Progress Notes (Signed)
Notified Dr. Tenny Craw of Hg 8.8, decreased UOP, decreased CVP&PADs, given order for 1u pRBC.

## 2022-09-24 NOTE — Anesthesia Procedure Notes (Signed)
Arterial Line Insertion Start/End2/14/2024 7:35 AM, 09/24/2022 7:40 AM Performed by: Colin Benton, CRNA, CRNA  Patient location: Pre-op. Preanesthetic checklist: patient identified, IV checked, site marked, risks and benefits discussed, surgical consent, monitors and equipment checked, pre-op evaluation, timeout performed and anesthesia consent Lidocaine 1% used for infiltration Left, radial was placed Catheter size: 20 G Hand hygiene performed , maximum sterile barriers used  and Seldinger technique used Allen's test indicative of satisfactory collateral circulation Attempts: 1 Procedure performed without using ultrasound guided technique. Following insertion, dressing applied and Biopatch. Post procedure assessment: normal and unchanged  Patient tolerated the procedure well with no immediate complications.

## 2022-09-24 NOTE — Anesthesia Postprocedure Evaluation (Signed)
Anesthesia Post Note  Patient: Ricardo Zuniga  Procedure(s) Performed: INSERTION OF IMPLANTABLE LEFT VENTRICULAR ASSIST DEVICE AND AORTIC VALVE CLOSURE (Chest) TRANSESOPHAGEAL ECHOCARDIOGRAM     Patient location during evaluation: PACU Anesthesia Type: General Level of consciousness: awake and alert Pain management: pain level controlled Vital Signs Assessment: post-procedure vital signs reviewed and stable Respiratory status: spontaneous breathing, nonlabored ventilation, respiratory function stable and patient connected to nasal cannula oxygen Cardiovascular status: blood pressure returned to baseline and stable Postop Assessment: no apparent nausea or vomiting Anesthetic complications: no  No notable events documented.  Last Vitals:  Vitals:   09/24/22 0827 09/24/22 1543  BP:    Pulse: 78   Resp: 18   Temp:    SpO2: 97% 100%    Last Pain:  Vitals:   09/24/22 0400  TempSrc: Core  PainSc: 0-No pain                 Effie Berkshire

## 2022-09-24 NOTE — Progress Notes (Signed)
iNO weaned from 20ppm to 15ppm.   VAD flow 3.9, PAP 35/16, CVP 9. Numbers unchanged post-wean.

## 2022-09-24 NOTE — Transfer of Care (Signed)
Immediate Anesthesia Transfer of Care Note  Patient: Ricardo Zuniga  Procedure(s) Performed: INSERTION OF IMPLANTABLE LEFT VENTRICULAR ASSIST DEVICE AND AORTIC VALVE CLOSURE (Chest) TRANSESOPHAGEAL ECHOCARDIOGRAM  Patient Location: ICU  Anesthesia Type:General  Level of Consciousness: sedated and Patient remains intubated per anesthesia plan  Airway & Oxygen Therapy: Patient remains intubated per anesthesia plan and Patient placed on Ventilator (see vital sign flow sheet for setting)  Post-op Assessment: Report given to RN and Post -op Vital signs reviewed and stable  Post vital signs: Reviewed and stable  Last Vitals:  Vitals Value Taken Time  BP 68/57   63 1522 09/24/22  Temp    Pulse 83 1522 09/24/22  Resp 21 1522 09/24/22  SpO2 99 1522 09/24/22    Last Pain:  Vitals:   09/24/22 0400  TempSrc: Core  PainSc: 0-No pain         Complications: No notable events documented.

## 2022-09-24 NOTE — Progress Notes (Signed)
iNO weaned from 15 to 10ppm by respiratory. CVP 10, VAD Flow 3.9, unchanged post-wean.

## 2022-09-24 NOTE — Interval H&P Note (Signed)
History and Physical Interval Note:  09/24/2022 7:49 AM  Ricardo Zuniga  has presented today for surgery, with the diagnosis of ICM.  The various methods of treatment have been discussed with the patient and family. After consideration of risks, benefits and other options for treatment, the patient has consented to  Procedure(s): INSERTION OF IMPLANTABLE LEFT VENTRICULAR ASSIST DEVICE (N/A) TRANSESOPHAGEAL ECHOCARDIOGRAM (N/A) as a surgical intervention.  The patient's history has been reviewed, patient examined, no change in status, stable for surgery.  I have reviewed the patient's chart and labs.  Questions were answered to the patient's satisfaction.     Pierre Bali Jamilet Ambroise

## 2022-09-24 NOTE — Progress Notes (Signed)
EVENING ROUNDS NOTE :     [address]       [place] [address]             [phone]                 Day of Surgery Procedure(s) (LRB): INSERTION OF IMPLANTABLE LEFT VENTRICULAR ASSIST DEVICE AND AORTIC VALVE CLOSURE (N/A) TRANSESOPHAGEAL ECHOCARDIOGRAM (N/A)   Total Length of Stay:  LOS: 2 days  Events:   Making urine Good hemodynamics    BP 91/78 (BP Location: Left Arm)   Pulse 86   Temp 97.9 F (36.6 C)   Resp (!) 21   Ht 5' 9"$  (1.753 m)   Wt 82.1 kg   SpO2 94%   BMI 26.73 kg/m   PAP: (33-62)/(7-29) 44/15 CVP:  [0 mmHg-40 mmHg] 10 mmHg CO:  [4.6 L/min-5.4 L/min] 5.4 L/min CI:  [2.3 L/min/m2-2.7 L/min/m2] 2.7 L/min/m2  Vent Mode: PRVC FiO2 (%):  [50 %] 50 % Set Rate:  [15 bmp] 15 bmp Vt Set:  [560 mL] 560 mL PEEP:  [5 cmH20] 5 cmH20   sodium chloride 10 mL/hr at 09/24/22 1601   [START ON 09/25/2022] sodium chloride     sodium chloride 10 mL/hr at 09/24/22 1515   albumin human 12.5 g (09/24/22 1716)   amiodarone      ceFAZolin (ANCEF) IV     dexmedetomidine (PRECEDEX) IV infusion 0.7 mcg/kg/hr (09/24/22 1553)   epinephrine 5 mcg/min (09/24/22 1604)   famotidine (PEPCID) IV 20 mg (09/24/22 1609)   [START ON 09/25/2022] fluconazole (DIFLUCAN) IV     insulin 2.2 Units/hr (09/24/22 1555)   lactated ringers     lactated ringers     lactated ringers 10 mL/hr at 09/24/22 1605   magnesium sulfate 4 g (09/24/22 1643)   milrinone 0.25 mcg/kg/min (09/24/22 1557)   norepinephrine (LEVOPHED) Adult infusion 13 mcg/min (09/24/22 1754)   potassium chloride 10 mEq (09/24/22 1741)   [START ON 09/25/2022] rifampin (RIFADIN) 600 mg in sodium chloride 0.9 % 100 mL IVPB     vancomycin      I/O last 3 completed shifts: In: 810.6 [P.O.:600; I.V.:210.6] Out: [id number] [Urine:4650; Stool:1]      Latest Ref Rng & Units 09/24/2022    3:34 PM 09/24/2022    1:38 PM 09/24/2022    1:35 PM  CBC  WBC 4.0 - 10.5 K/uL 15.4     Hemoglobin 13.0 - 17.0 g/dL 10.4  9.9   10.2   Hematocrit 39.0 - 52.0 % 30.3  29.0  30.0   Platelets 150 - 400 K/uL 150 - 400 K/uL 132    132          Latest Ref Rng & Units 09/24/2022    3:34 PM 09/24/2022    1:38 PM 09/24/2022    1:35 PM  BMP  Glucose 70 - 99 mg/dL 133  193    BUN 8 - 23 mg/dL 19  24    Creatinine 0.61 - 1.24 mg/dL 1.17  1.00    Sodium 135 - 145 mmol/L 138  141  141   Potassium 3.5 - 5.1 mmol/L 3.4  3.5  3.5   Chloride 98 - 111 mmol/L 103  102    CO2 22 - 32 mmol/L 25     Calcium 8.9 - 10.3 mg/dL 8.7       ABG    Component Value Date/Time   PHART 7.383 09/24/2022 1335   PCO2ART 39.7 09/24/2022 1335  PO2ART 415 (H) 09/24/2022 1335   HCO3 23.7 09/24/2022 1335   TCO2 26 09/24/2022 1338   ACIDBASEDEF 1.0 09/24/2022 1335   O2SAT 100 09/24/2022 [place], MD 09/24/2022 5:59 PM

## 2022-09-24 NOTE — Progress Notes (Signed)
CSW met with patient's family in the waiting room. Wife asked appropriate questions and grateful for the ongoing communication from the OR by the VAD Coordinator. She plans to stay and see patient post surgery and then will go home for the evening and retrun in the AM. CSW provided supportive intervention and will continue to follow throughout implant hospitalization. Raquel Sarna, [place], [place]

## 2022-09-24 NOTE — Anesthesia Procedure Notes (Signed)
Procedure Name: Intubation Date/Time: 09/24/2022 8:57 AM  Performed by: Colin Benton, CRNAPre-anesthesia Checklist: Patient identified, Emergency Drugs available, Suction available and Patient being monitored Patient Re-evaluated:Patient Re-evaluated prior to induction Oxygen Delivery Method: Circle system utilized Preoxygenation: Pre-oxygenation with 100% oxygen Induction Type: IV induction Ventilation: Mask ventilation without difficulty and Oral airway inserted - appropriate to patient size Laryngoscope Size: 4 and Glidescope Grade View: Grade I Tube type: Oral Tube size: 8.0 mm Number of attempts: 1 Airway Equipment and Method: Oral airway, Rigid stylet and Video-laryngoscopy Placement Confirmation: ETT inserted through vocal cords under direct vision, positive ETCO2 and breath sounds checked- equal and bilateral Secured at: 22 cm Tube secured with: Tape Dental Injury: Teeth and Oropharynx as per pre-operative assessment

## 2022-09-24 NOTE — Brief Op Note (Signed)
09/22/2022 - 09/24/2022  2:42 PM  PATIENT:  Ricardo Zuniga  72 y.o. male  PRE-OPERATIVE DIAGNOSIS:  Ischemic Cardiomyopathy  POST-OPERATIVE DIAGNOSIS:  Ischemic Cardiomyopathy  PROCEDURES:   -INSERTION OF IMPLANTABLE LEFT VENTRICULAR ASSIST DEVICE AND AORTIC VALVE CLOSURE  -MEDIAN STERNOTOMY -TRANSESOPHAGEAL ECHOCARDIOGRAM   SURGEONS:     * Enter, Pierre Bali, MD - Primary    * Dahlia Byes, MD - Assisting  PHYSICIAN ASSISTANT: Yoltzin Barg  ASSISTANTS: Bruins, Brooklyn L, Scrub Person Sammie Bench, RN, RN First Assistant   ANESTHESIA:   general  EBL: 247m  BLOOD ADMINISTERED: 3633m FFP, 65083mell Saver blood  DRAINS:  Mediastinal drains    LOCAL MEDICATIONS USED:  parasternal Exparel   COUNTS:  Correct  DICTATION: .Dragon Dictation  PLAN OF CARE: Admit to inpatient   PATIENT DISPOSITION:  ICU - intubated and hemodynamically stable.   Delay start of Pharmacological VTE agent (>24hrs) due to surgical blood loss or risk of bleeding: yes

## 2022-09-24 NOTE — H&P (Signed)
NAME:  Ricardo Zuniga, MRN:  [id number], DOB:  09-05-1950, LOS: 2 ADMISSION DATE:  09/22/2022, CONSULTATION DATE:  09/24/2022 REFERRING MD:  Enter (CT surgery), CHIEF COMPLAINT: Vent management s/p LVAD implantation  History of Present Illness:  The patient is a 72 yo M with PMH of HFrEF (possibly d/t endocarditis), T2DM, APML s/p ATRA in 2014 who underwent LVAD implantation 2/14.  Was previously admitted in 05/2022 for orthopnea, PND, and LE edema and echo showed LVEF of 30-35%.  Heart cath showed CAD and patient was discharged on GDMT.  Repeat echo 1/24 showed EF 25%, severe LV dilation, global hypokinesis, moderate RV dysfunction, mild RV enlargement, and mild-to moderate MR; rapid deterioration from previous health.  LVAD implantation was urgently planned.  Procedure was successful and patient transferred to ICU 2/14 PM sedated and intubated.  Did require Glide for difficult airway.  Received rifampin, vancomycin, and cefazolin for preoperative prophylaxis.  Now in ICU on pressors, NO, and milrinone, weaning off ventilator and NO.  Pertinent  Medical History   Diagnosis   APML (acute promyelocytic leukemia) in remission ([place])   Completed treatment 04/2013   HFrEF   EF 25% on 09/03/2022   Coronary artery disease   Nonobstructive at cardiac catheterization 09/2018   Gastroesophageal reflux disease   Hypercholesteremia   Neuropathy   bilateral feet   Nonischemic cardiomyopathy ([place])   a. EF 20% in 2015 - thought to possibly be viral induced or chemotherapy induced from treatments in 2014 b. at 35-40% by echo in 05/2016    Peptic ulcer disease   Type 2 diabetes mellitus ([place])   Vertigo   tx with valium prn    Significant Hospital Events: Including procedures, antibiotic start and stop dates in addition to other pertinent events   2/12 - Admitted for RHC and optimization 2/14 - LVAD implantation, transferred to ICU  Interim History / Subjective:  Patient arrived hypotensive and  volume down.  Received 1 unit FFP and TXA postop.  On epi, norepi, milrinone, and NO. Sedated with Precedex and propofol.  Did have 260 mL blood drainage from surgical site on arrival.  Objective   Blood pressure (!) 122/105, pulse 78, temperature (!) 97.2 F (36.2 C), resp. rate 18, height 5' 9"$  (1.753 m), weight 82.1 kg, SpO2 97 %. PAP: (33-62)/(7-29) 45/28 CVP:  [0 mmHg-40 mmHg] 8 mmHg CO:  [4.6 L/min] 4.6 L/min CI:  [2.3 L/min/m2] 2.3 L/min/m2      Intake/Output Summary (Last 24 hours) at 09/24/2022 1301 Last data filed at 09/24/2022 1300 Gross per 24 hour  Intake 1732.57 ml  Output 3505 ml  Net -1772.43 ml   Filed Weights   09/22/22 1508 09/23/22 0500 09/24/22 0451  Weight: 82.6 kg 85.4 kg 82.1 kg   Examination: General: Elderly male, sedated and intubated. In no acute distress. HEENT: Pupils constricted and nonreactive, dry mucous membranes, NG tube in place. Lungs: Mechanical breath sounds. Bronchial breath sounds at lung bases bilaterally. Cardiovascular: Normal rate, regular rhythm. Sternal rub audible. Continuous mechanical murmur at apex. Sternal dressing in place, no crepitus. Actively draining thin bloody fluid in surgical site drains. Abdomen: Nondistended, soft. GU: Foley in place and draining clear urine. Extremities: Capillary refill 3 seconds. Extremities pink and perfused. + pulses on Doppler. Skin: Warm, dry. No rashes or petechiae. Neuro: Heavily sedated. Not responsive to stimuli, not following instructions.  [facility]  CXR post-op: Mild pulmonary vascular congestion, LVAD in place, Swan-Ganz catheter in place, otherwise unremarkable. EKG  post-op: Too much artifact  Assessment & Plan:  HFrEF (NYHA class III) s/p LVAD implantation Priority is hemostasis, possible that will require further platelets and/or blood products.  Does appear to be actively draining fair amount of blood from surgical site, suspect Von Willebrand factor shearing in  setting of cardiac surgery.  Currently providing fluid resuscitation, MAP and CVP stable on medication regimen as below. - Admitted to ICU, attending Dr. Lynetta Mare - Volume resuscitation with albumin solution - Milrinone and epi per CT surgery - NO for RV dilation, wean by 5 ppm/hr to 5 ppm by 7 am tomorrow - MAP goal 65-80, norepi titrated as needed - Amiodarone 30 mg/hr - Morphine injection Q1h PRN for pain control - f/u TEG panel, COOX panels Q1 - f/u STAT lactate and then monitor Q3h lactate overnight - BMP and Mag with goal K > 4 and Mg > 2, replete as needed - CBC and trend H&H - Foley in place, monitory I/Os closely - Continuous cardiac monitoring  Acute hypoxic respiratory failure Ventilator requirement in setting of postoperative sedation. - PRVC; Delayed SIMV Rapid Wean Protocol per surgery - On Precedex, propofol - VAP prevention bundle  T2DM - Q6 CBGs - Continuous insulin infusion postoperatively - Likely SSI when off ventilator and tolerating PO diet  HLD - Holding home atorvastatin in setting of recent surgery  Best Practice (right click and "Reselect all SmartList Selections" daily)   Diet/type: NPO DVT prophylaxis: systemic dose LMWH, SCDs GI prophylaxis: H2B Lines: Central line, Arterial Line, and yes and it is still needed Foley:  Yes, and it is still needed Code Status:  full code Last date of multidisciplinary goals of care discussion: family to visit in PM today  Labs   CBC: Recent Labs  Lab 09/19/22 1330 09/22/22 1031 09/22/22 1843 09/23/22 0416 09/24/22 0324 09/24/22 0431 09/24/22 1100 09/24/22 1113 09/24/22 1118 09/24/22 1202 09/24/22 1239  WBC 5.8  --  6.5 5.6 5.5  --   --   --   --   --   --   HGB 14.1   < > 14.4 13.3 13.4   < > 12.9* 10.2* 10.2* 9.9* 9.2*  HCT 40.1   < > 43.1 39.7 38.5*   < > 38.0* 30.0* 30.0* 29.0* 27.0*  MCV 88.5  --  91.9 91.7 90.4  --   --   --   --   --   --   PLT 223  --  226 190 188  --   --   --   --   --    --    < > = values in this interval not displayed.    Basic Metabolic Panel: Recent Labs  Lab 09/19/22 1330 09/22/22 1031 09/23/22 0416 09/23/22 1810 09/24/22 0324 09/24/22 0431 09/24/22 1008 09/24/22 1100 09/24/22 1113 09/24/22 1118 09/24/22 1202 09/24/22 1239  NA 138   < > 140 134* 136   < > 139 139 138 139 139 140  K 4.0   < > 3.3* 4.2 3.8   < > 4.3 3.9 4.0 4.1 4.2 4.4  CL 103  --  103 98 101   < > 104 103  --  102 102 101  CO2 21*  --  26 25 26  $ --   --   --   --   --   --   --   GLUCOSE 144*  --  127* 217* 122*   < > 145* 136*  --  120* 121* 129*  BUN 18  --  21 22 22   $ < > 23 23  --  20 21 22  $ CREATININE 1.23   < > 1.00 1.24 1.14   < > 1.00 1.00  --  0.90 1.00 1.00  CALCIUM 8.8*  --  9.3 9.3 9.2  --   --   --   --   --   --   --   MG  --   --  2.1  --  2.0  --   --   --   --   --   --   --    < > = values in this interval not displayed.   GFR: Estimated Creatinine Clearance: 66.8 mL/min (by C-G formula based on SCr of 1 mg/dL). Recent Labs  Lab 09/19/22 1330 09/22/22 1843 09/23/22 0416 09/24/22 0324  WBC 5.8 6.5 5.6 5.5    Liver Function Tests: Recent Labs  Lab 09/19/22 1330 09/23/22 0416  AST 25 19  ALT 30 24  ALKPHOS 76 66  BILITOT 1.2 1.0  PROT 7.1 6.4*  ALBUMIN 3.6 3.3*   No results for input(s): "LIPASE", "AMYLASE" in the last 168 hours. No results for input(s): "AMMONIA" in the last 168 hours.  ABG    Component Value Date/Time   PHART 7.380 09/24/2022 1113   PCO2ART 40.1 09/24/2022 1113   PO2ART 508 (H) 09/24/2022 1113   HCO3 23.7 09/24/2022 1113   TCO2 27 09/24/2022 1239   ACIDBASEDEF 1.0 09/24/2022 1113   O2SAT 100 09/24/2022 1113     Coagulation Profile: Recent Labs  Lab 09/24/22 0324  INR 1.3*    Cardiac Enzymes: No results for input(s): "CKTOTAL", "CKMB", "CKMBINDEX", "TROPONINI" in the last 168 hours.  HbA1C: Hgb A1c MFr Bld  Date/Time Value Ref Range Status  09/24/2022 03:24 AM 7.0 (H) 4.8 - 5.6 % Final    Comment:     (NOTE) Pre diabetes:          5.7%-6.4%  Diabetes:              >6.4%  Glycemic control for   <7.0% adults with diabetes   09/02/2022 05:47 AM 7.4 (H) 4.8 - 5.6 % Final    Comment:    (NOTE) Pre diabetes:          5.7%-6.4%  Diabetes:              >6.4%  Glycemic control for   <7.0% adults with diabetes     CBG: Recent Labs  Lab 09/23/22 0740 09/23/22 1142 09/23/22 1647 09/23/22 2204 09/24/22 0625  GLUCAP 234* 177* 162* 226* 149*    Review of Systems:   Per HPI  Past Medical History:  He,  has a past medical history of Anal fissure, APML (acute promyelocytic leukemia) in remission ([place]), CHF (congestive heart failure) ([place]), Coronary artery disease, Difficult intubation, Gastroesophageal reflux disease, History of blood transfusion (2014), History of kidney stones, Hypercholesteremia, Neuropathy, Nonischemic cardiomyopathy ([place]), Peptic ulcer disease, Pneumonia, Type 2 diabetes mellitus ([place]), Ureteral colic, Vertigo, Wears glasses, and Wears hearing aid in both ears.   Surgical History:   Past Surgical History:  Procedure Laterality Date   ANAL FISSURE REPAIR     APPENDECTOMY     BIOPSY  08/17/2020   Procedure: BIOPSY;  Surgeon: Harvel Quale, MD;  [facility];  Service: Gastroenterology;;   CARPAL TUNNEL RELEASE Left 10/16/2017   Procedure: LEFT CARPAL TUNNEL RELEASE;  Surgeon: French Ana,  Quillian Quince, MD;  Location: [facility];  Service: Orthopedics;  Laterality: Left;   CARPAL TUNNEL RELEASE Right 11/25/2019   Procedure: CARPAL TUNNEL RELEASE;  Surgeon: Earlie Server, MD;  Location: [facility];  Service: Orthopedics;  Laterality: Right;   CATARACT EXTRACTION, BILATERAL     CERVICAL DISC ARTHROPLASTY N/A 01/05/2018   Procedure: Artificial Cervical Disc ReplacementCervical Three-Four, Cervical Four-Five;  Surgeon: Kristeen Miss, MD;  Location: [facility];  Service: Neurosurgery;  Laterality: N/A;   CERVICAL FUSION     CHOLECYSTECTOMY     COLONOSCOPY N/A  06/27/2015   Procedure: COLONOSCOPY;  Surgeon: Rogene Houston, MD;  [facility];  Service: Endoscopy;  Laterality: N/A;  730   COLONOSCOPY WITH PROPOFOL N/A 08/17/2020   Procedure: COLONOSCOPY WITH PROPOFOL;  Surgeon: Harvel Quale, MD;  [facility];  Service: Gastroenterology;  Laterality: N/A;  11:15   COLONOSCOPY WITH PROPOFOL N/A 10/23/2021   Procedure: COLONOSCOPY WITH PROPOFOL;  Surgeon: Harvel Quale, MD;  [facility];  Service: Gastroenterology;  Laterality: N/A;  9:45am   ESOPHAGOGASTRODUODENOSCOPY (EGD) WITH PROPOFOL N/A 08/17/2020   Procedure: ESOPHAGOGASTRODUODENOSCOPY (EGD) WITH PROPOFOL;  Surgeon: Harvel Quale, MD;  [facility];  Service: Gastroenterology;  Laterality: N/A;   KNEE SURGERY     x3   LEFT AND RIGHT HEART CATHETERIZATION WITH CORONARY ANGIOGRAM N/A 12/12/2013   Procedure: LEFT AND RIGHT HEART CATHETERIZATION WITH CORONARY ANGIOGRAM;  Surgeon: [name], MD;  Location: [facility];  Service: Cardiovascular;  Laterality: N/A;   LEFT HEART CATH AND CORONARY ANGIOGRAPHY N/A 09/17/2018   Procedure: LEFT HEART CATH AND CORONARY ANGIOGRAPHY;  Surgeon: Wellington Hampshire, MD;  Location: [place];  Service: Cardiovascular;  Laterality: N/A;   LUMBAR FUSION     LUMBAR LAMINECTOMY/DECOMPRESSION MICRODISCECTOMY Bilateral 01/02/2022   Procedure: Bilateral Lumbar Three-Four/Lumbar Four-Five Sublaminar decompression;  Surgeon: Kristeen Miss, MD;  Location: [facility];  Service: Neurosurgery;  Laterality: Bilateral;  3C/RM 21 To Follow Dr Annette Stable   POLYPECTOMY  08/17/2020   Procedure: POLYPECTOMY;  Surgeon: Harvel Quale, MD;  [facility];  Service: Gastroenterology;;   POLYPECTOMY  10/23/2021   Procedure: POLYPECTOMY;  Surgeon: Harvel Quale, MD;  [facility];  Service: Gastroenterology;;   RIGHT HEART CATH N/A 09/01/2022   Procedure: RIGHT HEART CATH;   Surgeon: Hebert Soho, DO;  Location: [place];  Service: Cardiovascular;  Laterality: N/A;   RIGHT HEART CATH N/A 09/22/2022   Procedure: RIGHT HEART CATH;  Surgeon: Hebert Soho, DO;  Location: [facility];  Service: Cardiovascular;  Laterality: N/A;   RIGHT/LEFT HEART CATH AND CORONARY ANGIOGRAPHY N/A 06/10/2022   Procedure: RIGHT/LEFT HEART CATH AND CORONARY ANGIOGRAPHY;  Surgeon: Hebert Soho, DO;  Location: [place];  Service: Cardiovascular;  Laterality: N/A;   SHOULDER ACROMIOPLASTY Right 07/18/2015   Procedure: SHOULDER ACROMIOPLASTY;  Surgeon: Earlie Server, MD;  Location: [facility];  Service: Orthopedics;  Laterality: Right;   SHOULDER ARTHROSCOPY Right 07/18/2015   Procedure: Right shoulder arthroscopy with debridement;  Surgeon: Earlie Server, MD;  Location: [facility];  Service: Orthopedics;  Laterality: Right;   SHOULDER SURGERY     x3   TONSILLECTOMY       Social History:   reports that he quit smoking about 36 years ago. His smoking use included cigarettes. He started smoking about 54 years ago. He has a 34.00 pack-year smoking history. He has never  used smokeless tobacco. He reports that he does not drink alcohol and does not use drugs.   Family History:  His family history includes Colon cancer in an other family member.   Allergies Allergies  Allergen Reactions   Zestril [Lisinopril] Cough     Home Medications  Prior to Admission medications   Medication Sig Start Date End Date Taking? Authorizing Provider  acetaminophen (TYLENOL) 650 MG CR tablet Take 650-1,300 mg by mouth every 8 (eight) hours as needed for pain.   Yes [provider]  albuterol (VENTOLIN HFA) 108 (90 Base) MCG/ACT inhaler Inhale 2 puffs into the lungs every 6 (six) hours as needed for wheezing or shortness of breath. 05/23/22  Yes Loel Dubonnet, NP  ALPRAZolam Duanne Moron) 0.25 MG tablet Take 0.25 mg by mouth daily at  6 PM. 05/13/22  Yes [provider]  amiodarone (PACERONE) 200 MG tablet Take 1 tablet (200 mg total) by mouth 2 (two) times daily. 08/19/22  Yes Sabharwal, Aditya, DO  aspirin EC 81 MG tablet Take 81 mg by mouth in the morning.   Yes [provider]  atorvastatin (LIPITOR) 40 MG tablet Take 1 tablet (40 mg total) by mouth daily. 03/21/22  Yes Satira Sark, MD  Cholecalciferol (VITAMIN D3) 125 MCG (5000 UT) TABS Take 5,000 Units by mouth in the morning.   Yes [provider]  Digoxin 62.5 MCG TABS Take 1 tablet by mouth daily. 09/03/22  Yes Earnie Larsson, NP  docusate sodium (COLACE) 100 MG capsule Take 200 mg by mouth every evening.   Yes [provider]  empagliflozin (JARDIANCE) 10 MG TABS tablet Take 10 mg by mouth every evening.   Yes [provider]  furosemide (LASIX) 20 MG tablet Take 40 mg by mouth 2 (two) times daily.   Yes [provider]  guaiFENesin (MUCINEX) 600 MG 12 hr tablet Take 600 mg by mouth daily as needed (congestion.).   Yes [provider]  Multiple Vitamin (MULTIVITAMIN WITH MINERALS) TABS tablet Take 1 tablet by mouth daily. 09/03/22  Yes Earnie Larsson, NP  omeprazole (PRILOSEC) 20 MG capsule Take 20 mg by mouth daily before breakfast.   Yes [provider]  ondansetron (ZOFRAN) 4 MG tablet Take 1 tablet (4 mg total) by mouth every 6 (six) hours as needed for nausea. 11/03/21  Yes Sheikh, Omair Latif, DO  potassium chloride SA (KLOR-CON M) 20 MEQ tablet Take 2 tablets (40 mEq total) by mouth daily. 09/19/22  Yes Larey Dresser, MD  repaglinide (PRANDIN) 2 MG tablet Take 2-4 mg by mouth See admin instructions. Take 2 tablets (4 mg) by mouth in the morning & take 1 tablet (2 mg) by mouth in the evening. 07/06/17  Yes [provider]  sacubitril-valsartan Delene Loll) 24-26 MG Take 1 tablet by mouth twice daily 08/22/22  Yes Satira Sark, MD  spironolactone (ALDACTONE) 25 MG tablet Take 1 tablet  (25 mg total) by mouth daily. 09/19/22  Yes Larey Dresser, MD  traZODone (DESYREL) 50 MG tablet Take 1 tablet (50 mg total) by mouth at bedtime as needed for sleep. 09/02/22  Yes Earnie Larsson, NP  zinc gluconate 50 MG tablet Take 50 mg by mouth in the morning.   Yes [provider]  diazepam (VALIUM) 5 MG tablet Take 5 mg by mouth daily as needed (vertigo). 10/17/21   [provider]    Daryana Whirley [place], MS4 09/24/2022, 1:01 PM Johnell Comings, PCCM

## 2022-09-24 NOTE — Anesthesia Procedure Notes (Signed)
Central Venous Catheter Insertion Performed by: Effie Berkshire, MD, anesthesiologist Start/End2/14/2024 7:45 AM, 09/24/2022 7:55 AM Patient location: Pre-op. Preanesthetic checklist: patient identified, IV checked, site marked, risks and benefits discussed, surgical consent, monitors and equipment checked, pre-op evaluation, timeout performed and anesthesia consent Position: Trendelenburg Lidocaine 1% used for infiltration and patient sedated Hand hygiene performed , maximum sterile barriers used  and Seldinger technique used Catheter size: 9 Fr Total catheter length 10. Central line was placed.Sheath introducer Swan type:thermodilution PA Cath depth:50 Procedure performed using ultrasound guided technique. Ultrasound Notes:anatomy identified, needle tip was noted to be adjacent to the nerve/plexus identified, no ultrasound evidence of intravascular and/or intraneural injection and image(s) printed for medical record Attempts: 1 Following insertion, line sutured, dressing applied and Biopatch. Post procedure assessment: blood return through all ports, free fluid flow and no air  Patient tolerated the procedure well with no immediate complications.

## 2022-09-24 NOTE — Progress Notes (Signed)
Pharmacy Antibiotic Note  Ricardo Zuniga is a 72 y.o. male admitted on 09/22/2022 for LVAD consideration. S/p LVAD 2/14 . Pharmacy has been consulted for Vancomycin dosing x 48 hours post LVAD. Pt also on Ancef x 48 hours  Plan: Continue Vancomycin 1gm IV q12h x 3 doses (as ordered) to cover x 48 hours post LVAD. Will f/u SCr and pt's clinical condition  Height: 5' 9"$  (175.3 cm) Weight: 82.1 kg (181 lb) IBW/kg (Calculated) : 70.7  Temp (24hrs), Avg:97.6 F (36.4 C), Min:97 F (36.1 C), Max:98.2 F (36.8 C)  Recent Labs  Lab 09/19/22 1330 09/22/22 1843 09/23/22 0416 09/23/22 1810 09/24/22 0324 09/24/22 0911 09/24/22 1100 09/24/22 1118 09/24/22 1202 09/24/22 1239 09/24/22 1338  WBC 5.8 6.5 5.6  --  5.5  --   --   --   --   --   --   CREATININE 1.23 1.18 1.00   < > 1.14   < > 1.00 0.90 1.00 1.00 1.00   < > = values in this interval not displayed.    Estimated Creatinine Clearance: 66.8 mL/min (by C-G formula based on SCr of 1 mg/dL).    Allergies  Allergen Reactions   Zestril [Lisinopril] Cough    Antimicrobials this admission: 2/14 Ancef >> 2/15 2/14 Vanc >> 2/15  Thank you for allowing pharmacy to be a part of this patient's care.  Sherlon Handing, PharmD, BCPS Please see amion for complete clinical pharmacist phone list 09/24/2022 3:52 PM

## 2022-09-24 NOTE — Progress Notes (Signed)
VAD complete  Stay on epi 4 milrinone 0.25.  Levo can titrate to MAP 65-80  Typically I do the following: POD 0-1 - working on nitric POD 2 - consider extubation POD 3 - changing inotropic gtts more.

## 2022-09-24 NOTE — Op Note (Incomplete)
OPERATIVE NOTE: Patient Name: Ricardo Zuniga Date of Birth: 03/19/1951 Date of Operation: @Date$ @  PRE-OPERATIVE DIAGNOSIS:   POST-OPERATIVE DIAGNOSIS: Same  OPERATION: Heartmate III LVAD Insertion   SURGEON: Pierre Bali Romolo Sieling MD   ASSISTANT: ***  EBL 100cc  FINDINGS:  TIMES: XC:  min CPB:  min   SPECIMENS None   COMPLICATIONS: None  TUBES:  2 24 Fr blakes (left pleural and mediastinal) 1 JP to Bulb (mediastinal)   PROCEDURE IN DETAIL: The patient was brought in the operating room and laid in supine position.  The patient was prepped and draped in standard fashion.  An arterial, pulmonary arterial and venous lines were placed by anesthesia along with a single-lumen endotracheal tube. The VAD was opened and prepped.  Local analgesia given to the sternum. Sternotomy was performed and the pericardium was opened. The driveline was tunneled out of the patient. The VAD was set up in the field with a vancomycin lap pad surrounding it.   Full dose heparin was given to achieve an ACT of 480 and aortic and venous cannulas were inserted.The patient was placed on cardiopulmonary bypass and using a cross-clamp and cardiac arrest was achieved with 1.2  L of modified blood [place] cardioplegia antegrade.   Topical cooling was used as well on the heart.    Next, the cross-clamp was released  with the root vent on.  After de-airing the heart came back into regular sinus rhythm and the heart took over the circulation. Bypass was weaned and cannulas were removed, then protamine was given and hemostasis was achieved. Chest tubes were placed as above along with ventricular and ***atrial wires.  The chest was then closed in interrupted steel wire and the presternal layers were closed in 3 layers of absorbable suture.  The patient had a stable status and was transferred to the postoperative care unit on *** of epinephrine and *** of Levophed. All surgical counts were correct.

## 2022-09-24 NOTE — Progress Notes (Signed)
Sacral foam removed and protocol was ended intra-operatively. Pad will be replaced post-procedure to restart protocol.

## 2022-09-24 NOTE — Progress Notes (Signed)
   09/24/22 0740  Spiritual Encounters  Type of Visit Initial  Care provided to: Patient  Conversation partners present during encounter Nurse  Referral source Nurse (RN/NT/LPN)  Reason for visit Surgical  OnCall Visit Yes   Chaplain responded to a call for prayer before surgery. The patient, Mortimer Fries shared that he was ready and had many people praying for him as he under goes this operation. As Bobby's prep continued we created a space for prayer and allowed the words that sat in our hearts to be spoken over the medical team his family and Mortimer Fries.   Danice Goltz  [facility]  [phone]

## 2022-09-25 ENCOUNTER — Encounter (HOSPITAL_COMMUNITY): Payer: Self-pay | Admitting: Cardiothoracic Surgery

## 2022-09-25 ENCOUNTER — Inpatient Hospital Stay (HOSPITAL_COMMUNITY): Payer: Medicare PPO

## 2022-09-25 DIAGNOSIS — I5023 Acute on chronic systolic (congestive) heart failure: Secondary | ICD-10-CM

## 2022-09-25 DIAGNOSIS — R570 Cardiogenic shock: Secondary | ICD-10-CM | POA: Diagnosis not present

## 2022-09-25 LAB — MAGNESIUM
Magnesium: 2.4 mg/dL (ref 1.7–2.4)
Magnesium: 2.4 mg/dL (ref 1.7–2.4)

## 2022-09-25 LAB — POCT I-STAT 7, (LYTES, BLD GAS, ICA,H+H)
Acid-Base Excess: 1 mmol/L (ref 0.0–2.0)
Acid-base deficit: 2 mmol/L (ref 0.0–2.0)
Acid-base deficit: 4 mmol/L — ABNORMAL HIGH (ref 0.0–2.0)
Acid-base deficit: 5 mmol/L — ABNORMAL HIGH (ref 0.0–2.0)
Bicarbonate: 25.7 mmol/L (ref 20.0–28.0)
Bicarbonate: 23.4 mmol/L (ref 20.0–28.0)
Bicarbonate: 21.3 mmol/L (ref 20.0–28.0)
Bicarbonate: 22.2 mmol/L (ref 20.0–28.0)
Calcium, Ion: 1.17 mmol/L (ref 1.15–1.40)
Calcium, Ion: 1.17 mmol/L (ref 1.15–1.40)
Calcium, Ion: 1.17 mmol/L (ref 1.15–1.40)
Calcium, Ion: 1.22 mmol/L (ref 1.15–1.40)
HCT: 30 % — ABNORMAL LOW (ref 39.0–52.0)
HCT: 30 % — ABNORMAL LOW (ref 39.0–52.0)
HCT: 29 % — ABNORMAL LOW (ref 39.0–52.0)
HCT: 30 % — ABNORMAL LOW (ref 39.0–52.0)
Hemoglobin: 10.2 g/dL — ABNORMAL LOW (ref 13.0–17.0)
Hemoglobin: 10.2 g/dL — ABNORMAL LOW (ref 13.0–17.0)
Hemoglobin: 9.9 g/dL — ABNORMAL LOW (ref 13.0–17.0)
Hemoglobin: 10.2 g/dL — ABNORMAL LOW (ref 13.0–17.0)
O2 Saturation: 100 %
O2 Saturation: 99 %
O2 Saturation: 97 %
O2 Saturation: 97 %
Patient temperature: 38.3
Patient temperature: 37.2
Patient temperature: 37
Patient temperature: 36.4
Potassium: 4.6 mmol/L (ref 3.5–5.1)
Potassium: 4.8 mmol/L (ref 3.5–5.1)
Potassium: 4.6 mmol/L (ref 3.5–5.1)
Potassium: 4.6 mmol/L (ref 3.5–5.1)
Sodium: 138 mmol/L (ref 135–145)
Sodium: 138 mmol/L (ref 135–145)
Sodium: 137 mmol/L (ref 135–145)
Sodium: 137 mmol/L (ref 135–145)
TCO2: 27 mmol/L (ref 22–32)
TCO2: 25 mmol/L (ref 22–32)
TCO2: 22 mmol/L (ref 22–32)
TCO2: 24 mmol/L (ref 22–32)
pCO2 arterial: 42.4 mmHg (ref 32–48)
pCO2 arterial: 42.2 mmHg (ref 32–48)
pCO2 arterial: 40.7 mmHg (ref 32–48)
pCO2 arterial: 46.5 mmHg (ref 32–48)
pH, Arterial: 7.395 (ref 7.35–7.45)
pH, Arterial: 7.353 (ref 7.35–7.45)
pH, Arterial: 7.326 — ABNORMAL LOW (ref 7.35–7.45)
pH, Arterial: 7.284 — ABNORMAL LOW (ref 7.35–7.45)
pO2, Arterial: 196 mmHg — ABNORMAL HIGH (ref 83–108)
pO2, Arterial: 133 mmHg — ABNORMAL HIGH (ref 83–108)
pO2, Arterial: 95 mmHg (ref 83–108)
pO2, Arterial: 105 mmHg (ref 83–108)

## 2022-09-25 LAB — BPAM FFP
Blood Product Expiration Date: 202402182359
Blood Product Expiration Date: 202402182359
Blood Product Expiration Date: 202402182359
Blood Product Expiration Date: 202402182359
ISSUE DATE / TIME: 202402140841
ISSUE DATE / TIME: 202402140841
ISSUE DATE / TIME: 202402140841
ISSUE DATE / TIME: 202402140841
Unit Type and Rh: 6200
Unit Type and Rh: 6200
Unit Type and Rh: 6200
Unit Type and Rh: 6200

## 2022-09-25 LAB — BASIC METABOLIC PANEL
Anion gap: 7 (ref 5–15)
BUN: 16 mg/dL (ref 8–23)
CO2: 22 mmol/L (ref 22–32)
Calcium: 8.2 mg/dL — ABNORMAL LOW (ref 8.9–10.3)
Chloride: 105 mmol/L (ref 98–111)
Creatinine, Ser: 1.04 mg/dL (ref 0.61–1.24)
GFR, Estimated: >60 mL/min (ref >60)
Glucose, Bld: 136 mg/dL — ABNORMAL HIGH (ref 70–99)
Potassium: 4.5 mmol/L (ref 3.5–5.1)
Sodium: 134 mmol/L — ABNORMAL LOW (ref 135–145)

## 2022-09-25 LAB — COOXEMETRY PANEL
Carboxyhemoglobin: 2.1 % — ABNORMAL HIGH (ref 0.5–1.5)
Carboxyhemoglobin: 2.8 % — ABNORMAL HIGH (ref 0.5–1.5)
Methemoglobin: <0.7 % (ref 0.0–1.5)
Methemoglobin: 1.2 % (ref 0.0–1.5)
O2 Saturation: 74.2 %
O2 Saturation: 70.4 %
Total hemoglobin: 9.8 g/dL — ABNORMAL LOW (ref 12.0–16.0)
Total hemoglobin: 9.7 g/dL — ABNORMAL LOW (ref 12.0–16.0)

## 2022-09-25 LAB — COMPREHENSIVE METABOLIC PANEL
ALT: 20 U/L (ref 0–44)
AST: 54 U/L — ABNORMAL HIGH (ref 15–41)
Albumin: 3.9 g/dL (ref 3.5–5.0)
Alkaline Phosphatase: 43 U/L (ref 38–126)
Anion gap: 10 (ref 5–15)
BUN: 17 mg/dL (ref 8–23)
CO2: 22 mmol/L (ref 22–32)
Calcium: 8.4 mg/dL — ABNORMAL LOW (ref 8.9–10.3)
Chloride: 105 mmol/L (ref 98–111)
Creatinine, Ser: 1.08 mg/dL (ref 0.61–1.24)
GFR, Estimated: >60 mL/min (ref >60)
Glucose, Bld: 117 mg/dL — ABNORMAL HIGH (ref 70–99)
Potassium: 4.4 mmol/L (ref 3.5–5.1)
Sodium: 137 mmol/L (ref 135–145)
Total Bilirubin: 1.9 mg/dL — ABNORMAL HIGH (ref 0.3–1.2)
Total Protein: 6.1 g/dL — ABNORMAL LOW (ref 6.5–8.1)

## 2022-09-25 LAB — PREPARE FRESH FROZEN PLASMA
Unit division: 0
Unit division: 0
Unit division: 0

## 2022-09-25 LAB — GLUCOSE, CAPILLARY
Glucose-Capillary: 106 mg/dL — ABNORMAL HIGH (ref 70–99)
Glucose-Capillary: 108 mg/dL — ABNORMAL HIGH (ref 70–99)
Glucose-Capillary: 108 mg/dL — ABNORMAL HIGH (ref 70–99)
Glucose-Capillary: 109 mg/dL — ABNORMAL HIGH (ref 70–99)
Glucose-Capillary: 113 mg/dL — ABNORMAL HIGH (ref 70–99)
Glucose-Capillary: 114 mg/dL — ABNORMAL HIGH (ref 70–99)
Glucose-Capillary: 117 mg/dL — ABNORMAL HIGH (ref 70–99)
Glucose-Capillary: 123 mg/dL — ABNORMAL HIGH (ref 70–99)
Glucose-Capillary: 126 mg/dL — ABNORMAL HIGH (ref 70–99)
Glucose-Capillary: 131 mg/dL — ABNORMAL HIGH (ref 70–99)
Glucose-Capillary: 134 mg/dL — ABNORMAL HIGH (ref 70–99)
Glucose-Capillary: 136 mg/dL — ABNORMAL HIGH (ref 70–99)
Glucose-Capillary: 136 mg/dL — ABNORMAL HIGH (ref 70–99)
Glucose-Capillary: 148 mg/dL — ABNORMAL HIGH (ref 70–99)
Glucose-Capillary: 150 mg/dL — ABNORMAL HIGH (ref 70–99)
Glucose-Capillary: 154 mg/dL — ABNORMAL HIGH (ref 70–99)
Glucose-Capillary: 156 mg/dL — ABNORMAL HIGH (ref 70–99)
Glucose-Capillary: 158 mg/dL — ABNORMAL HIGH (ref 70–99)
Glucose-Capillary: 159 mg/dL — ABNORMAL HIGH (ref 70–99)
Glucose-Capillary: 168 mg/dL — ABNORMAL HIGH (ref 70–99)
Glucose-Capillary: 172 mg/dL — ABNORMAL HIGH (ref 70–99)

## 2022-09-25 LAB — SURGICAL PATHOLOGY

## 2022-09-25 LAB — CBC
HCT: 28.5 % — ABNORMAL LOW (ref 39.0–52.0)
Hemoglobin: 9.8 g/dL — ABNORMAL LOW (ref 13.0–17.0)
MCH: 31.6 pg (ref 26.0–34.0)
MCHC: 34.4 g/dL (ref 30.0–36.0)
MCV: 91.9 fL (ref 80.0–100.0)
Platelets: 118 10*3/uL — ABNORMAL LOW (ref 150–400)
RBC: 3.1 MIL/uL — ABNORMAL LOW (ref 4.22–5.81)
RDW: 17.2 % — ABNORMAL HIGH (ref 11.5–15.5)
WBC: 14.5 10*3/uL — ABNORMAL HIGH (ref 4.0–10.5)
nRBC: 0 % (ref 0.0–0.2)

## 2022-09-25 LAB — PROTIME-INR
INR: 1.6 — ABNORMAL HIGH (ref 0.8–1.2)
Prothrombin Time: 18.5 seconds — ABNORMAL HIGH (ref 11.4–15.2)

## 2022-09-25 LAB — CBC WITH DIFFERENTIAL/PLATELET
Abs Immature Granulocytes: 0.05 10*3/uL (ref 0.00–0.07)
Basophils Absolute: 0 10*3/uL (ref 0.0–0.1)
Basophils Relative: 0 %
Eosinophils Absolute: 0 10*3/uL (ref 0.0–0.5)
Eosinophils Relative: 0 %
HCT: 29.5 % — ABNORMAL LOW (ref 39.0–52.0)
Hemoglobin: 9.7 g/dL — ABNORMAL LOW (ref 13.0–17.0)
Immature Granulocytes: 1 %
Lymphocytes Relative: 8 %
Lymphs Abs: 0.9 10*3/uL (ref 0.7–4.0)
MCH: 30.1 pg (ref 26.0–34.0)
MCHC: 32.9 g/dL (ref 30.0–36.0)
MCV: 91.6 fL (ref 80.0–100.0)
Monocytes Absolute: 0.9 10*3/uL (ref 0.1–1.0)
Monocytes Relative: 9 %
Neutro Abs: 8.9 10*3/uL — ABNORMAL HIGH (ref 1.7–7.7)
Neutrophils Relative %: 82 %
Platelets: 137 10*3/uL — ABNORMAL LOW (ref 150–400)
RBC: 3.22 MIL/uL — ABNORMAL LOW (ref 4.22–5.81)
RDW: 16.6 % — ABNORMAL HIGH (ref 11.5–15.5)
WBC: 10.8 10*3/uL — ABNORMAL HIGH (ref 4.0–10.5)
nRBC: 0 % (ref 0.0–0.2)

## 2022-09-25 LAB — PHOSPHORUS: Phosphorus: 3.1 mg/dL (ref 2.5–4.6)

## 2022-09-25 LAB — BRAIN NATRIURETIC PEPTIDE: B Natriuretic Peptide: 1252.3 pg/mL — ABNORMAL HIGH (ref 0.0–100.0)

## 2022-09-25 LAB — LACTIC ACID, PLASMA: Lactic Acid, Venous: 1.5 mmol/L (ref 0.5–1.9)

## 2022-09-25 LAB — LACTATE DEHYDROGENASE: LDH: 284 U/L — ABNORMAL HIGH (ref 98–192)

## 2022-09-25 MED ORDER — DIGOXIN 125 MCG PO TABS
0.0625 mg | ORAL_TABLET | Freq: Every day | ORAL | Status: DC
  Administered 2022-09-26 – 2022-10-07 (×12): 0.0625 mg via ORAL
  Filled 2022-09-25 (×12): qty 1

## 2022-09-25 MED ORDER — PANTOPRAZOLE SODIUM 40 MG PO TBEC
40.0000 mg | DELAYED_RELEASE_TABLET | Freq: Every day | ORAL | Status: DC
  Administered 2022-09-25 – 2022-10-07 (×13): 40 mg via ORAL
  Filled 2022-09-25 (×13): qty 1

## 2022-09-25 MED ORDER — ALBUMIN HUMAN 5 % IV SOLN
12.5000 g | Freq: Once | INTRAVENOUS | Status: AC
  Administered 2022-09-25: 12.5 g via INTRAVENOUS

## 2022-09-25 MED ORDER — AMIODARONE HCL 200 MG PO TABS
200.0000 mg | ORAL_TABLET | Freq: Two times a day (BID) | ORAL | Status: DC
  Administered 2022-09-25 – 2022-09-29 (×9): 200 mg via ORAL
  Filled 2022-09-25 (×9): qty 1

## 2022-09-25 MED ORDER — ORAL CARE MOUTH RINSE: 15.0000 mL | OROMUCOSAL | Status: DC | PRN

## 2022-09-25 MED ORDER — AMIODARONE HCL 200 MG PO TABS
200.0000 mg | ORAL_TABLET | Freq: Two times a day (BID) | ORAL | Status: DC
  Administered 2022-09-25: 200 mg
  Filled 2022-09-25: qty 1

## 2022-09-25 MED ORDER — DIGOXIN 0.0625 MG HALF TABLET
0.0625 mg | ORAL_TABLET | Freq: Every day | ORAL | Status: DC
  Administered 2022-09-25: 0.0625 mg
  Filled 2022-09-25: qty 1

## 2022-09-25 MED ORDER — SODIUM CHLORIDE 0.9 % IV SOLN
12.5000 mg | Freq: Three times a day (TID) | INTRAVENOUS | Status: DC | PRN
  Administered 2022-09-25 – 2022-10-02 (×11): 12.5 mg via INTRAVENOUS
  Filled 2022-09-25: qty 0.5
  Filled 2022-09-25 (×4): qty 12.5
  Filled 2022-09-25: qty 0.5
  Filled 2022-09-25 (×4): qty 12.5
  Filled 2022-09-25: qty 0.5

## 2022-09-25 MED ORDER — ATORVASTATIN CALCIUM 40 MG PO TABS
40.0000 mg | ORAL_TABLET | Freq: Every day | ORAL | Status: DC
  Administered 2022-09-26 – 2022-10-07 (×12): 40 mg via ORAL
  Filled 2022-09-25 (×12): qty 1

## 2022-09-25 MED ORDER — ATORVASTATIN CALCIUM 40 MG PO TABS
40.0000 mg | ORAL_TABLET | Freq: Every day | ORAL | Status: DC
  Administered 2022-09-25: 40 mg
  Filled 2022-09-25: qty 1

## 2022-09-25 MED ORDER — VASOPRESSIN 20 UNITS/100 ML INFUSION FOR SHOCK
0.0000 [IU]/min | INTRAVENOUS | Status: DC
  Administered 2022-09-25 – 2022-09-26 (×4): 0.04 [IU]/min via INTRAVENOUS
  Administered 2022-09-27: 0.03 [IU]/min via INTRAVENOUS
  Filled 2022-09-25 (×5): qty 100

## 2022-09-25 NOTE — Progress Notes (Signed)
CSW met at bedside with patient and wife. Patient was extubated and in good spirits. Patient very talkative and engaged in conversation with his wife. CSW provided supportive intervention and will continue to follow. Raquel Sarna, [place], [place]

## 2022-09-25 NOTE — Procedures (Signed)
Extubation Procedure Note  Patient Details:   Name: Ricardo Zuniga DOB: 1951/08/03 MRN: [id number]   Airway Documentation:    Vent end date: 09/25/22 Vent end time: 1045   Evaluation  O2 sats: stable throughout Complications: No apparent complications Patient did tolerate procedure well. Bilateral Breath Sounds: Clear, Diminished   Yes  Pt extubated per order/CCM and placed on 4L [place]. Pt passed all parameters with a VC of 1.42L and a NIF -39. Pt had a positive cuff leak, able to state name and no stridor noted.   [name] 09/25/2022, 10:57 AM

## 2022-09-25 NOTE — Progress Notes (Signed)
RT stopped NO per CCM and switched pt to cpap/ps 10/5 40% to start rapid wean. Pt is tolerating it well with stable VS at this time.

## 2022-09-25 NOTE — Progress Notes (Signed)
iNO weaned from 10 to 5ppm by respiratory. Numbers unchanged.   Will maintain iNO at 5ppm till day shift per MD.

## 2022-09-25 NOTE — Progress Notes (Addendum)
Advanced Heart Failure VAD Team Note  PCP-Cardiologist: [name], MD   Subjective:   2/15 S/P HMIII LVAD . Given 1 PURBCs   Currently on Milrinone 0.125 mcg, Epi 5, Norepi 12 mcg   Febrile overnight.   RA 13  34/17 CO 5.2  CI 2.6   Remains intubated on iNO 5 ppm. Awake   LVAD INTERROGATION:  HeartMate III LVAD:   Flow 4 liters/min, speed 5100, power 4, PI 2.4.  No PI events.   Objective:    Vital Signs:   Temp:  [97.9 F (36.6 C)-101.1 F (38.4 C)] 100 F (37.8 C) (02/15 0705) Pulse Rate:  [73-203] 165 (02/15 0705) Resp:  [12-25] 18 (02/15 0705) BP: (71-122)/(42-105) 71/42 (02/15 0600) SpO2:  [81 %-100 %] 100 % (02/15 0705) Arterial Line BP: (73-104)/(59-75) 78/65 (02/15 0705) FiO2 (%):  [50 %] 50 % (02/15 0400) Weight:  [88 kg] 88 kg (02/15 0500) Last BM Date : 09/23/22 Mean arterial Pressure 70s  Intake/Output:   Intake/Output Summary (Last 24 hours) at 09/25/2022 0707 Last data filed at 09/25/2022 0657 Gross per 24 hour  Intake 7857.72 ml  Output 5535 ml  Net 2322.72 ml    CVP 13-14  Physical Exam    General:  Intubated HEENT: ETT Neck: supple. JVP  difficult to assess  Carotids 2+ bilat; no bruits. No lymphadenopathy or thyromegaly appreciated. RIJ swan  Cor: Mechanical heart sounds with LVAD hum present. Lungs:Coarse throughout  Abdomen: soft, nontender, nondistended. No hepatosplenomegaly. No bruits or masses. Good bowel sounds. Driveline: C/D/I; securement device intact  Extremities: no cyanosis, clubbing, rash, edema Neuro: awake follows commands. MAE x4.  GU: Foley    Telemetry  SR   EKG    N/A   Labs   Basic Metabolic Panel: Recent Labs  Lab 09/23/22 0416 09/23/22 1810 09/24/22 0324 09/24/22 0431 09/24/22 1202 09/24/22 1239 09/24/22 1335 09/24/22 1338 09/24/22 1534 09/24/22 1537 09/24/22 2127 09/24/22 2159 09/25/22 0258 09/25/22 0507  NA 140 134* 136   < > 139 140   < > 141 138 141  --  140 137 138  K 3.3* 4.2  3.8   < > 4.2 4.4   < > 3.5 3.4* 3.5  --  3.7 4.4 4.6  CL 103 98 101   < > 102 101  --  102 103  --   --   --  105  --   CO2 26 25 26  $ --   --   --   --   --  25  --   --   --  22  --   GLUCOSE 127* 217* 122*   < > 121* 129*  --  193* 133*  --   --   --  117*  --   BUN 21 22 22   $ < > 21 22  --  24* 19  --   --   --  17  --   CREATININE 1.00 1.24 1.14   < > 1.00 1.00  --  1.00 1.17  --   --   --  1.08  --   CALCIUM 9.3 9.3 9.2  --   --   --   --   --  8.7*  --   --   --  8.4*  --   MG 2.1  --  2.0  --   --   --   --   --  2.0  --  2.6*  --  2.4  --   PHOS  --   --   --   --   --   --   --   --   --   --   --   --  3.1  --    < > = values in this interval not displayed.    Liver Function Tests: Recent Labs  Lab 09/19/22 1330 09/23/22 0416 09/25/22 0258  AST 25 19 54*  ALT 30 24 20  $ ALKPHOS 76 66 43  BILITOT 1.2 1.0 1.9*  PROT 7.1 6.4* 6.1*  ALBUMIN 3.6 3.3* 3.9   No results for input(s): "LIPASE", "AMYLASE" in the last 168 hours. No results for input(s): "AMMONIA" in the last 168 hours.  CBC: Recent Labs  Lab 09/23/22 0416 09/24/22 0324 09/24/22 0431 09/24/22 1301 09/24/22 1335 09/24/22 1534 09/24/22 1537 09/24/22 2127 09/24/22 2159 09/25/22 0258 09/25/22 0507  WBC 5.6 5.5  --   --   --  15.4*  --  13.1*  --  10.8*  --   NEUTROABS  --   --   --   --   --   --   --   --   --  8.9*  --   HGB 13.3 13.4   < > 9.0*   < > 10.4* 10.5* 8.9* 8.8* 9.7* 10.2*  HCT 39.7 38.5*   < > 27.0*   < > 30.3* 31.0* 25.9* 26.0* 29.5* 30.0*  MCV 91.7 90.4  --   --   --  91.5  --  92.2  --  91.6  --   PLT 190 188  --  139*  --  132*  132*  --  131*  --  137*  --    < > = values in this interval not displayed.    INR: Recent Labs  Lab 09/24/22 0324 09/24/22 1534 09/25/22 0258  INR 1.3* 1.7* 1.6*    Other results: EKG:    Imaging   DG Chest Port 1 View  Result Date: 09/24/2022 CLINICAL DATA:  Heart failure EXAM: PORTABLE CHEST 1 VIEW COMPARISON:  Chest x-ray 09/23/2022  FINDINGS: There are new sternotomy wires. Endotracheal tube tip is 4.7 cm above the carina. Swan-Ganz catheter tip is at the level of the main pulmonary artery. Enteric tube extends below the diaphragm. New LVAD in place. The heart is enlarged. There central pulmonary vascular congestion. There is no focal lung infiltrate, pleural effusion or pneumothorax. No acute fractures are seen. IMPRESSION: 1. Cardiomegaly with central pulmonary vascular congestion. 2. Support lines and tubes as above. Electronically Signed   By: Ronney Asters M.D.   On: 09/24/2022 16:02   ECHO INTRAOPERATIVE TEE  Result Date: 09/24/2022  *INTRAOPERATIVE TRANSESOPHAGEAL REPORT *  Patient Name:   Ricardo Zuniga Date of Exam: 09/24/2022 Medical Rec #:  [id number]        Height:       69.0 in Accession #:    [id number]       Weight:       181.0 lb Date of Birth:  06/23/1951        BSA:          1.98 m Patient Age:    72 years         BP:           110/75 mmHg Patient Gender: M                HR:  70 bpm. Exam Location:  Anesthesiology Transesophogeal exam was perform intraoperatively during surgical procedure. Patient was closely monitored under general anesthesia during the entirety of examination. Indications:     Heart Failure Performing Phys: Suella Broad MD PROCEDURE: Intraoperative Transesophogeal LVAD. Complications: No known complications during this procedure.            POST-OP IMPRESSIONS s/p LVAD placement & Aortic Valve closure _ Left Ventricle: LVAD in place. Inflow cannula in good position. No bowing or suction events noted. _ Right Ventricle: Improved RV function with inotrope support. _ Aorta: No dissection noted. _ Left Atrium: The left atrium appears unchanged from pre-bypass. _ Left Atrial Appendage: The left atrial appendage appears unchanged from pre-bypass. _ Aortic Valve: No significant insufficiency noted by color doppler. Leaflets fixed in the closed position. _ Mitral Valve: The mitral valve appears  unchanged from pre-bypass. _ Tricuspid Valve: The tricuspid valve appears unchanged from pre-bypass. _ Pulmonic Valve: The pulmonic valve appears unchanged from pre-bypass. _ Interatrial Septum: The interatrial septum appears unchanged from pre-bypass. _ Interventricular Septum: The interventricular septum appears unchanged from pre-bypass. _ Pericardium: No pericardial effusion is present.  - Left Ventricular Assist Device; Ramp Study: The aortic valve does not appear to open, consistent with appropriate LVAD function and no significant native left ventricular ejection. The inflow cannula was visualized. LVAD appropriately decompressed. LVAD function appears to be normal. PRE-OP FINDINGS  Left Ventricle: The left ventricle ejection fraction is 15 - 20%. The cavity size was moderately dilated. Left ventricular severe diffuse hypokinesis. There is no left ventricular hypertrophy. Left ventricular diastolic function was not evaluated. Right Ventricle: The right ventricle has moderatly reduced systolic function. The cavity was dilated. There is no increase in right ventricular wall thickness. Right ventricular systolic pressure is moderately elevated. Catheter present in the right ventricle. There is no aneurysm seen. Left Atrium: Left atrial size was dilated. No left atrial/left atrial appendage thrombus was detected. The left atrial appendage is well visualized and there is no evidence of thrombus present. Left atrial appendage velocity is reduced at less than 40 cm/s. Right Atrium: Right atrial size is dilated in size. Catheter present in the right atrium. Interatrial Septum: No atrial level shunt detected by color flow Doppler. Agitated saline contrast bubble study was negative, with no evidence of any interatrial shunt. There is no evidence of a patent foramen ovale. Pericardium: A small pericardial effusion is present. The pericardial effusion is circumferential. There is no pleural effusion. Mitral Valve: The  mitral valve is normal in structure. Mitral valve regurgitation is mild to moderate by color flow Doppler. The MR jet is centrally-directed. There is no evidence of mitral valve vegetation. There is no evidence of mitral stenosis. Tricuspid Valve: The tricuspid valve was normal in structure. Tricuspid valve regurgitation is mild by color flow Doppler. The jet is directed centrally. No evidence of tricuspid stenosis is present. There is no evidence of tricuspid valve vegetation. Aortic Valve: The aortic valve is tricuspid with moderate aortic regurgitation by color flow Doppler. The jet is centrally-directed. There is no stenosis of the aortic valve. There is no evidence of aortic valve vegetation. Pulmonic Valve: The pulmonic valve was normal in structure, with normal leaflet motion. No evidence of pumonic stenosis. Pulmonic valve regurgitation is mild by color flow Doppler. Aorta: The aortic root and ascending aorta are normal in size and structure. There is evidence of plaque in the aortic arch and descending aorta; Grade I, measuring 1-37m in size. Pulmonary Artery: SGordy Councilman  catheter present on the right. The pulmonary artery is of normal size. Pulmonary hypertension is moderate. Venous: The inferior vena cava was not well visualized. Shunts: There is no evidence of an atrial septal defect. +-------------+--------++ AORTIC VALVE          +-------------+--------++ AV Mean Grad:3.0 mmHg +-------------+--------++  Suella Broad MD Electronically signed by Suella Broad MD Signature Date/Time: 09/24/2022/3:47:55 PM    Final    EP STUDY  Result Date: 09/24/2022 See surgical note for result.    Medications:     Scheduled Medications:  sodium chloride   Intravenous Once   sodium chloride   Intravenous Once   acetaminophen  1,000 mg Oral Q6H   Or   acetaminophen (TYLENOL) oral liquid 160 mg/5 mL  1,000 mg Per Tube Q6H   aspirin EC  325 mg Oral Daily   Or   aspirin  324 mg Per Tube Daily   Or    aspirin  300 mg Rectal Daily   atorvastatin  40 mg Oral Daily   bisacodyl  10 mg Oral Daily   Or   bisacodyl  10 mg Rectal Daily   Chlorhexidine Gluconate Cloth  6 each Topical Daily   digoxin  0.0625 mg Oral Daily   docusate sodium  200 mg Oral Daily   mouth rinse  15 mL Mouth Rinse Q2H   [START ON 09/26/2022] pantoprazole  40 mg Oral Daily   polyethylene glycol  17 g Oral Daily   sodium chloride flush  10-40 mL Intracatheter Q12H   sodium chloride flush  3 mL Intravenous Q12H   vancomycin (VANCOCIN) 1,000 mg in sodium chloride 0.9 % 1,000 mL irrigation   Irrigation To OR    Infusions:  sodium chloride Stopped (09/25/22 0606)   sodium chloride     sodium chloride 10 mL/hr at 09/25/22 0657   albumin human Stopped (09/24/22 2050)   amiodarone      ceFAZolin (ANCEF) IV Stopped (09/25/22 0603)   dexmedetomidine (PRECEDEX) IV infusion Stopped (09/25/22 0549)   epinephrine 5 mcg/min (09/25/22 0657)   fluconazole (DIFLUCAN) IV 100 mL/hr at 09/25/22 0657   insulin 5 Units/hr (09/25/22 0657)   lactated ringers     lactated ringers     lactated ringers 10 mL/hr at 09/25/22 0657   milrinone 0.125 mcg/kg/min (09/25/22 0657)   norepinephrine (LEVOPHED) Adult infusion 12 mcg/min (09/25/22 0657)   rifampin (RIFADIN) 600 mg in sodium chloride 0.9 % 100 mL IVPB     vancomycin Stopped (09/24/22 2339)    PRN Medications: sodium chloride, acetaminophen, albumin human, albuterol, dextrose, lactated ringers, midazolam, morphine injection, ondansetron (ZOFRAN) IV, mouth rinse, mouth rinse, oxyCODONE, sodium chloride flush, sodium chloride flush, traMADol, traZODone   Patient Profile  72 y/o male w/ long standing h/o chronic systolic heart failure due to nonischemic cardiomyopathy, T2DM and hx of acute promyelocytic leukemia s/p ATRA ([facility] 2014). Recent development of rapidly progressive heart failure,  NYHA class IIIb-IV symptoms. Admitted for for RHC and optimization prior to VAD implant.   RHC  w/ elevated filling pressures and low output (RA 13, PAP 64/26 (40), PCWP 23, CI 2 L/min/m2) =>started on Milrinone  2/14 S/P HMIII VAD   Assessment/Plan:   1. Chronic HFrEF--> S/P HMIII LVAD  -Remains intubated on iNO. Extubation later this morning.  -Currently on norepi 12 mcg , Epi 5 mcg, and milrinone 0.125 mcg.  -CVP 13 Pa 34/17  CO 5.2 CI 2.6 - Consider IV lasix when ok with surgery.  -Continue  Aspirin 325 mg until INR adequate.  -Follow daily LDH/INR  -Renal function stable.  - Ramp echo once off pressors 2.Acute blood loss anemia Given 1UPRBCs  8.8>9.7  Follow daily CBC.  3.  NSVT/PVCS Rare PVCs.  4.  Hx of promyelocytic leukemia: S/P ATRA in 2014 at [facility].  5. . Hx of endocarditis: 2015   I reviewed the LVAD parameters from today, and compared the results to the patient's prior recorded data.  No programming changes were made.  The LVAD is functioning within specified parameters.  The patient performs LVAD self-test daily.  LVAD interrogation was negative for any significant power changes, alarms or PI events/speed drops.  LVAD equipment check completed and is in good working order.  Back-up equipment present.   LVAD education done on emergency procedures and precautions and reviewed exit site care.  Length of Stay: 3  Amy Clegg, NP 09/25/2022, 7:07 AM  VAD Team --- VAD ISSUES ONLY--- Pager [phone] (7am - 7am)  Advanced Heart Failure Team  Pager [phone] (M-F; 7a - 5p)  Please contact [facility] for night-coverage after hours (5p -7a ) and weekends on [email or web address]  ATTESTATION--------------------------------------------  Subjective: - Extubated today; awake/alert. Moving all extremities.    Exam: General: NAD HEENT: Normal.  Neck: Thick neck. No JVD, no thyromegaly or thyroid nodule.  Lungs: coarse lung sounds; mild inspiratory crackles. CV: normal LVAD hum   Abdomen: Soft, nontender, no hepatosplenomegaly, no distention.  Skin: Intact without lesions or  rashes.  Neurologic: awake/alert, no gross FND.  Psych: Normal affect. Extremities: No clubbing or cyanosis.    LVAD INTERROGATION:  HeartMate III LVAD:   Flow 4 liters/min, speed 5100, power 4, PI 2.4.  No PI events.      A/P - POD #1 after HMIII LVAD placement yesterday - While intubated this AM, CVP 123456, PA diastolic less than 18, PAPI 1.8-2.1 on iNO 5PPM, levophed 14mg with low dose epi and milrinone 0.1245m/kg/min  - Due to low filling pressures, administered 250cc IVF bolus; transitioned to vasopressin 0.04 and weaned off levophed without difficulty.  - Now extubated w/ BP 90/68 (a-line waveform consistent w/ native LV pulsatility), CVP 11, PA 36/16 on vaso 0.04, milrinone 0.12575mkg/min, and epi <5.  - Mixed venous 70, estimated Fick CI of 3.3 L/min/m2; elevated due to post-op vasoplegia.  - Repeat post-extubation ABG pending; repeat lactic acid pending. Plan for bedside TTE today to evaluate RV function.    Weyman Bogdon Advanced Heart Failure   CRITICAL CARE Performed by: AdiHebert Soho  Total critical care time: 45 minutes   Critical care time was exclusive of separately billable procedures and treating other patients.   Critical care was necessary to treat or prevent imminent or life-threatening deterioration.   Critical care was time spent personally by me on the following activities: development of treatment plan with patient and/or surrogate as well as nursing, discussions with consultants, evaluation of patient's response to treatment, examination of patient, obtaining history from patient or surrogate, ordering and performing treatments and interventions, ordering and review of laboratory studies, ordering and review of radiographic studies, pulse oximetry and re-evaluation of patient's condition.

## 2022-09-25 NOTE — Progress Notes (Signed)
LVAD Coordinator Rounding Note:  Admitted 09/22/22 to Dr. Conception Chancy service for Ricardo Zuniga and optimization prior to VAD implant.   HeartMate III LVAD implanted on 09/25/22 by Dr.Enter under DT criteria.   Patient awake on vent this morning actively weaning on the ventilator upon my arrival. VAD interrogated no events or alarms. VAD Coordinator returned this afternoon to change dressing with observation from wife Ricardo Zuniga. Patient alert and talkative. Dangled on side of the bed with nursing staff and tolerated it well. States his pain is well controlled. Vital signs: Temp: 99.7  HR:  86 Arterial line BP:  84/69 (77) Doppler MAP:76 O2 Sat: 98 Wt: 194lbs  LVAD interrogation reveals:  Speed:  5100 Flow: 3.6 Power:3.5w PI: 4.1 Alarms: none  Events: none Fixed speed:  5100 Low speed limit: 4800  Drive Line:  Existing VAD dressing removed and site care performed using sterile technique. Drive line exit site cleaned with Chlora prep applicators x 2, allowed to dry, and gauze dressing with Silver strip applied. Exit site with 3 sutures and unincorporated, the velour is fully implanted at exit site. Small amount of bloody drainage. No redness, tenderness, foul odor or rash noted. Drive line anchor re-applied. Continue daily dressing changes by VAD coordinator or Nurse champion. Next dressing change due 09/26/22.   Labs:  Hgb:  9.7  WBC:  10.8  LDH trend:  284  INR trend:  1.6  Co-ox: 74.2  Creatinine: 4.4  Intra-op blood products: 4 FFP Cell Saver 650 mL  ICU Blood Products: 09/25/22 1 PRBC  Arrhythmias:   Gtts: Epinephrine: 88mg/min Vasopressin: 0.04 units/min Milrinone: 0.1222m/kg/min  Ventilator Status:  Extubated 09/25/22  Nitric oxide:  Weaned off 09/25/22  Anticoagulation Plan: INR Goal: 2-2.5 ASA dose:32457mAdverse Events on VAD:   Patient Education: Patient's wife Ricardo Zuniga dressing change. Will plan to meet tomorrow with Ricardo Shirer her to perform  dressing change with assistance from VAD Coordinator.   Plan/Recommendations:  1. Page VAD coordinator for any issues with VAD equipment or driveline. 2. Daily dressing changes by VAD coordinator or nurse champion.   OliBobbye Morton, BSN VAD Coordinator 24/7 Pager [phone]

## 2022-09-25 NOTE — Progress Notes (Signed)
OT Cancellation Note  Patient Details Name: Ricardo Zuniga MRN: [id number] DOB: October 21, 1950   Cancelled Treatment:    Reason Eval/Treat Not Completed: Order Received. Patient not medically ready (Intubated/sedated. will assess when medically appropriate)  Cadyn Fann,HILLARY 09/25/2022, 7:06 AM Maurie Boettcher, OT/L   Acute OT Clinical Specialist Acute Rehabilitation Services Pager [phone] Office [phone]

## 2022-09-25 NOTE — Progress Notes (Signed)
Plan for 09/25/22  Discussed with Adi HF MD  Sitting up doing well   On 5 of nitric  On 12 of levo 5 of epi  3.03/5099 LVAD  TCTS Progress Note: 1 Day Post-Op Procedure(s) (LRB): INSERTION OF IMPLANTABLE LEFT VENTRICULAR ASSIST DEVICE AND AORTIC VALVE CLOSURE (N/A) TRANSESOPHAGEAL ECHOCARDIOGRAM (N/A)  LOS: 3 days      Latest Ref Rng & Units 09/25/2022    5:07 AM 09/25/2022    2:58 AM 09/24/2022    9:59 PM  CBC  WBC 4.0 - 10.5 K/uL  10.8    Hemoglobin 13.0 - 17.0 g/dL 10.2  9.7  8.8   Hematocrit 39.0 - 52.0 % 30.0  29.5  26.0   Platelets 150 - 400 K/uL  137         Latest Ref Rng & Units 09/25/2022    5:07 AM 09/25/2022    2:58 AM 09/24/2022    9:59 PM  CMP  Glucose 70 - 99 mg/dL  117    BUN 8 - 23 mg/dL  17    Creatinine 0.61 - 1.24 mg/dL  1.08    Sodium 135 - 145 mmol/L 138  137  140   Potassium 3.5 - 5.1 mmol/L 4.6  4.4  3.7   Chloride 98 - 111 mmol/L  105    CO2 22 - 32 mmol/L  22    Calcium 8.9 - 10.3 mg/dL  8.4    Total Protein 6.5 - 8.1 g/dL  6.1    Total Bilirubin 0.3 - 1.2 mg/dL  1.9    Alkaline Phos 38 - 126 U/L  43    AST 15 - 41 U/L  54    ALT 0 - 44 U/L  20      ABG    Component Value Date/Time   PHART 7.395 09/25/2022 0507   PCO2ART 42.4 09/25/2022 0507   PO2ART 196 (H) 09/25/2022 0507   HCO3 25.7 09/25/2022 0507   TCO2 27 09/25/2022 0507   ACIDBASEDEF 4.0 (H) 09/24/2022 2159   O2SAT 100 09/25/2022 0507    Vent Mode: PRVC FiO2 (%):  [50 %] 50 % Set Rate:  [15 bmp] 15 bmp Vt Set:  [560 mL] 560 mL PEEP:  [5 cmH20] 5 cmH20   A/P: POD 1 LVAD / AV closure Neuro: normal, understanding complex conversation CV: LVAD flows 3.8 / 5100 RPM.  Levo higher than expected Resp: nitric at 1 going to extubate this AM.  GI: no issues GU: uop stable, give lasix this AM Heme: gave 1 u rbc last night, dilutional but need around 10 when on sig pressors.  ID: periop abx, no issues Endo: no issues   PLAN: ADD VASO 0.04 GIVE 250 ALBUMIN  KEEP EPI  >=4, MILRINONE 0.125 NITRIC OFF EXTUBATE AFTER VASO ON 1-2 HOURS.

## 2022-09-25 NOTE — Progress Notes (Signed)
NAME:  Ricardo Zuniga, MRN:  [id number], DOB:  1951-07-04, LOS: 3 ADMISSION DATE:  09/22/2022, CONSULTATION DATE:  09/24/2022 REFERRING MD:  Enter (CT surgery), CHIEF COMPLAINT: Vent management s/p LVAD implantation  History of Present Illness:  The patient is a 72 yo M with PMH of HFrEF (possibly d/t endocarditis), T2DM, APML s/p ATRA in 2014 who just underwent LVAD implantation on 2/14.  Was previously admitted in 05/2022 for orthopnea, PND, and LE edema and echo showed LVEF of 30-35%.  Heart cath showed CAD and patient was discharged on GDMT.  Repeat echo 1/24 showed EF 25%, severe LV dilation, global hypokinesis, moderate RV dysfunction, mild RV enlargement, and mild-to moderate MR; rapid deterioration from previous health.  LVAD implantation was urgently planned.   Procedure was successful and patient transferred to ICU 2/14 PM, sedated and intubated.  Did require Glide for difficult airway.  Received rifampin, vancomycin, and cefazolin for preoperative prophylaxis.  Transferred to ICU on pressors, NO, and milrinone, weaning off ventilator and NO.  Extubated morning 2/15 and doing well.  Pertinent  Medical History   Diagnosis   APML (acute promyelocytic leukemia) in remission ([place])    Completed treatment 04/2013   HFrEF    EF 25% on 09/03/2022   Coronary artery disease    Nonobstructive at cardiac catheterization 09/2018   Gastroesophageal reflux disease   Hypercholesteremia   Neuropathy    bilateral feet   Nonischemic cardiomyopathy ([place])    a. EF 20% in 2015 - thought to possibly be viral induced or chemotherapy induced from treatments in 2014 b. at 35-40% by echo in 05/2016    Peptic ulcer disease   Type 2 diabetes mellitus ([place])   Vertigo    tx with valium prn    Significant Hospital Events: Including procedures, antibiotic start and stop dates in addition to other pertinent events   2/12 - Admitted for RHC and optimization 2/14 - LVAD implantation, transferred to ICU 2/15 -  Extubated, progressive ambulation and diet.  Interim History / Subjective:  Newly febrile overnight, but downtrending leukocytes.  This morning, patient is awake and fully conscious, though still on ventilator, thus unable to speak.  Indicates that he has throat discomfort and intermittently gags/coughs.  Does nod in agreement that he feels clammy and some chills.  Is otherwise doing well, alert and oriented.  iNO weaned, 5 epi, 12 levo, 0.125 milrinone.  Objective   Blood pressure (!) 152/138, pulse (!) 165, temperature 100 F (37.8 C), resp. rate 18, height 5' 9"$  (1.753 m), weight 88 kg, SpO2 100 %. PAP: (27-51)/(11-29) 34/17 CVP:  [8 mmHg-14 mmHg] 13 mmHg CO:  [4.6 L/min-5.4 L/min] 4.6 L/min CI:  [2.3 L/min/m2-2.7 L/min/m2] 2.3 L/min/m2  Vent Mode: PRVC FiO2 (%):  [50 %] 50 % Set Rate:  [15 bmp] 15 bmp Vt Set:  [560 mL] 560 mL PEEP:  [5 cmH20] 5 cmH20   Intake/Output Summary (Last 24 hours) at 09/25/2022 0725 Last data filed at 09/25/2022 0700 Gross per 24 hour  Intake 7867.11 ml  Output 5535 ml  Net 2332.11 ml   Filed Weights   09/23/22 0500 09/24/22 0451 09/25/22 0500  Weight: 85.4 kg 82.1 kg 88 kg   Examination: General: Elderly male sitting up in bed, awake and on ventilator. In no acute distress. HEENT: MMM. NCAT. PERRLA. Lungs: Clear to auscultation bilaterally. Cardiovascular: Continuous mechanical whir. Normal S1/S2. No rubs. Abdomen: Soft, nondistended, no TTP. GU: Foley in place, draining amber urine. Extremities: 1+ pitting edema  bilaterally. Feet warm, perfused. Skin: Diaphoretic, mildly clammy. Warm. Neuro: A&Ox4, no focal deficits. Following instructions, good cough.  Select New Labs & Diagnostics  BNP: 1954.3 > 1252.3 (baseline) INR: 1.7 > 1.6 Lactate: 2.5 > 2.4 WBC: 15.4 > 13.1 > 10.8  CXR 2/15: Minimal left basilar atelectasis, unchanged from yesterday. EKG 2/15: Significant artifact  Assessment & Plan:  HFrEF (NYHA class III) s/p LVAD  implantation Postoperative expected cardiogenic and vasoplegic shock Now hemostatic and progressing well.  Remains on levo SVR support as well as pulmonary vasodilation.  Milrinone also assisting with decreasing pulmonary resistance to prevent RV overload. iNO weaned successfully. - Milrinone and epi per CT surgery - MAP goal 65-80, norepi titrated as needed - Amiodarone 30 mg/hr - Morphine injection Q1h PRN for pain control - BMP and Mag with goal K > 4 and Mg > 2, replete as needed - CBC and trend H&H - Foley in place, monitory I/Os closely - Continuous cardiac monitoring   Postoperative mechanical ventilation Now off ventilator and on supplemental O2.  Oxygenating well.   T2DM - Q6 CBGs - Continuous insulin infusion postoperatively - Likely SSI when off ventilator and tolerating PO diet   HLD - Holding home atorvastatin in setting of recent surgery  Best Practice (right click and "Reselect all SmartList Selections" daily)   Diet/type: NPO DVT prophylaxis: SCD GI prophylaxis: H2B Lines: Central line, Arterial Line, and yes and it is still needed Foley:  Yes, and it is still needed Code Status:  full code Last date of multidisciplinary goals of care discussion: 2/15 updated patient in room  Mitchelle Sultan Humptulips, MS4 09/25/2022, 7:25 AM Johnell Comings, PCCM

## 2022-09-26 ENCOUNTER — Inpatient Hospital Stay (HOSPITAL_COMMUNITY): Payer: Medicare PPO

## 2022-09-26 DIAGNOSIS — R570 Cardiogenic shock: Secondary | ICD-10-CM | POA: Diagnosis not present

## 2022-09-26 DIAGNOSIS — D62 Acute posthemorrhagic anemia: Secondary | ICD-10-CM | POA: Diagnosis not present

## 2022-09-26 DIAGNOSIS — I5023 Acute on chronic systolic (congestive) heart failure: Secondary | ICD-10-CM

## 2022-09-26 DIAGNOSIS — E871 Hypo-osmolality and hyponatremia: Secondary | ICD-10-CM

## 2022-09-26 LAB — COMPREHENSIVE METABOLIC PANEL
ALT: 20 U/L (ref 0–44)
AST: 70 U/L — ABNORMAL HIGH (ref 15–41)
Albumin: 3.8 g/dL (ref 3.5–5.0)
Alkaline Phosphatase: 49 U/L (ref 38–126)
Anion gap: 9 (ref 5–15)
BUN: 16 mg/dL (ref 8–23)
CO2: 22 mmol/L (ref 22–32)
Calcium: 8.4 mg/dL — ABNORMAL LOW (ref 8.9–10.3)
Chloride: 102 mmol/L (ref 98–111)
Creatinine, Ser: 0.92 mg/dL (ref 0.61–1.24)
GFR, Estimated: >60 mL/min (ref >60)
Glucose, Bld: 104 mg/dL — ABNORMAL HIGH (ref 70–99)
Potassium: 4.7 mmol/L (ref 3.5–5.1)
Sodium: 133 mmol/L — ABNORMAL LOW (ref 135–145)
Total Bilirubin: 1.4 mg/dL — ABNORMAL HIGH (ref 0.3–1.2)
Total Protein: 6.4 g/dL — ABNORMAL LOW (ref 6.5–8.1)

## 2022-09-26 LAB — CBC WITH DIFFERENTIAL/PLATELET
Abs Immature Granulocytes: 0.08 10*3/uL — ABNORMAL HIGH (ref 0.00–0.07)
Basophils Absolute: 0 10*3/uL (ref 0.0–0.1)
Basophils Relative: 0 %
Eosinophils Absolute: 0 10*3/uL (ref 0.0–0.5)
Eosinophils Relative: 0 %
HCT: 28.6 % — ABNORMAL LOW (ref 39.0–52.0)
Hemoglobin: 9.6 g/dL — ABNORMAL LOW (ref 13.0–17.0)
Immature Granulocytes: 1 %
Lymphocytes Relative: 4 %
Lymphs Abs: 0.7 10*3/uL (ref 0.7–4.0)
MCH: 31 pg (ref 26.0–34.0)
MCHC: 33.6 g/dL (ref 30.0–36.0)
MCV: 92.3 fL (ref 80.0–100.0)
Monocytes Absolute: 1.4 10*3/uL — ABNORMAL HIGH (ref 0.1–1.0)
Monocytes Relative: 9 %
Neutro Abs: 13.4 10*3/uL — ABNORMAL HIGH (ref 1.7–7.7)
Neutrophils Relative %: 86 %
Platelets: 126 10*3/uL — ABNORMAL LOW (ref 150–400)
RBC: 3.1 MIL/uL — ABNORMAL LOW (ref 4.22–5.81)
RDW: 17.5 % — ABNORMAL HIGH (ref 11.5–15.5)
WBC: 15.5 10*3/uL — ABNORMAL HIGH (ref 4.0–10.5)
nRBC: 0 % (ref 0.0–0.2)

## 2022-09-26 LAB — GLUCOSE, CAPILLARY
Glucose-Capillary: 111 mg/dL — ABNORMAL HIGH (ref 70–99)
Glucose-Capillary: 118 mg/dL — ABNORMAL HIGH (ref 70–99)
Glucose-Capillary: 119 mg/dL — ABNORMAL HIGH (ref 70–99)
Glucose-Capillary: 127 mg/dL — ABNORMAL HIGH (ref 70–99)
Glucose-Capillary: 130 mg/dL — ABNORMAL HIGH (ref 70–99)
Glucose-Capillary: 149 mg/dL — ABNORMAL HIGH (ref 70–99)
Glucose-Capillary: 167 mg/dL — ABNORMAL HIGH (ref 70–99)
Glucose-Capillary: 167 mg/dL — ABNORMAL HIGH (ref 70–99)
Glucose-Capillary: 169 mg/dL — ABNORMAL HIGH (ref 70–99)
Glucose-Capillary: 235 mg/dL — ABNORMAL HIGH (ref 70–99)
Glucose-Capillary: 85 mg/dL (ref 70–99)
Glucose-Capillary: 96 mg/dL (ref 70–99)

## 2022-09-26 LAB — COOXEMETRY PANEL
Carboxyhemoglobin: 2.2 % — ABNORMAL HIGH (ref 0.5–1.5)
Methemoglobin: <0.7 % (ref 0.0–1.5)
O2 Saturation: 68.7 %
Total hemoglobin: 9.5 g/dL — ABNORMAL LOW (ref 12.0–16.0)

## 2022-09-26 LAB — PROTIME-INR
INR: 1.7 — ABNORMAL HIGH (ref 0.8–1.2)
Prothrombin Time: 19.9 seconds — ABNORMAL HIGH (ref 11.4–15.2)

## 2022-09-26 LAB — LACTATE DEHYDROGENASE: LDH: 338 U/L — ABNORMAL HIGH (ref 98–192)

## 2022-09-26 LAB — MAGNESIUM: Magnesium: 2.3 mg/dL (ref 1.7–2.4)

## 2022-09-26 LAB — CALCIUM, IONIZED: Calcium, Ionized, Serum: 4.7 mg/dL (ref 4.5–5.6)

## 2022-09-26 LAB — PHOSPHORUS: Phosphorus: 2.5 mg/dL (ref 2.5–4.6)

## 2022-09-26 MED ORDER — DOCUSATE SODIUM 100 MG PO CAPS
200.0000 mg | ORAL_CAPSULE | Freq: Two times a day (BID) | ORAL | Status: DC
  Administered 2022-09-26 – 2022-10-07 (×16): 200 mg via ORAL
  Filled 2022-09-26 (×17): qty 2

## 2022-09-26 MED ORDER — INSULIN ASPART 100 UNIT/ML IJ SOLN
0.0000 [IU] | INTRAMUSCULAR | Status: DC
  Administered 2022-09-26 (×2): 4 [IU] via SUBCUTANEOUS
  Administered 2022-09-27: 8 [IU] via SUBCUTANEOUS
  Administered 2022-09-27 (×3): 4 [IU] via SUBCUTANEOUS
  Administered 2022-09-27: 2 [IU] via SUBCUTANEOUS
  Administered 2022-09-27 – 2022-09-28 (×2): 8 [IU] via SUBCUTANEOUS
  Administered 2022-09-28 – 2022-09-29 (×5): 2 [IU] via SUBCUTANEOUS
  Administered 2022-09-29: 4 [IU] via SUBCUTANEOUS
  Administered 2022-09-29: 2 [IU] via SUBCUTANEOUS
  Administered 2022-09-29 (×2): 4 [IU] via SUBCUTANEOUS
  Administered 2022-09-30: 2 [IU] via SUBCUTANEOUS
  Administered 2022-09-30 (×2): 4 [IU] via SUBCUTANEOUS
  Administered 2022-09-30 – 2022-10-01 (×3): 2 [IU] via SUBCUTANEOUS
  Administered 2022-10-01: 4 [IU] via SUBCUTANEOUS
  Administered 2022-10-01: 2 [IU] via SUBCUTANEOUS
  Administered 2022-10-01: 4 [IU] via SUBCUTANEOUS
  Administered 2022-10-02: 2 [IU] via SUBCUTANEOUS
  Administered 2022-10-02: 8 [IU] via SUBCUTANEOUS
  Administered 2022-10-02: 2 [IU] via SUBCUTANEOUS
  Administered 2022-10-02 – 2022-10-03 (×3): 4 [IU] via SUBCUTANEOUS
  Administered 2022-10-03: 12 [IU] via SUBCUTANEOUS
  Administered 2022-10-03 (×2): 2 [IU] via SUBCUTANEOUS
  Administered 2022-10-04 (×2): 4 [IU] via SUBCUTANEOUS
  Administered 2022-10-04: 8 [IU] via SUBCUTANEOUS
  Administered 2022-10-04 (×2): 2 [IU] via SUBCUTANEOUS
  Administered 2022-10-04: 4 [IU] via SUBCUTANEOUS
  Administered 2022-10-05 (×2): 12 [IU] via SUBCUTANEOUS
  Administered 2022-10-05 (×3): 2 [IU] via SUBCUTANEOUS

## 2022-09-26 MED ORDER — WARFARIN - PHYSICIAN DOSING INPATIENT: Freq: Every day | Status: DC

## 2022-09-26 MED ORDER — METOCLOPRAMIDE HCL 5 MG/ML IJ SOLN
10.0000 mg | Freq: Three times a day (TID) | INTRAMUSCULAR | Status: DC
  Administered 2022-09-26 – 2022-09-30 (×13): 10 mg via INTRAVENOUS
  Filled 2022-09-26 (×13): qty 2

## 2022-09-26 MED ORDER — FUROSEMIDE 10 MG/ML IJ SOLN
40.0000 mg | Freq: Once | INTRAMUSCULAR | Status: AC
  Administered 2022-09-26: 40 mg via INTRAVENOUS
  Filled 2022-09-26: qty 4

## 2022-09-26 MED ORDER — BISACODYL 10 MG RE SUPP: 10.0000 mg | Freq: Every day | RECTAL | Status: DC

## 2022-09-26 MED ORDER — HEPARIN SODIUM (PORCINE) 5000 UNIT/ML IJ SOLN
5000.0000 [IU] | Freq: Three times a day (TID) | INTRAMUSCULAR | Status: DC
  Administered 2022-09-26 – 2022-09-28 (×7): 5000 [IU] via SUBCUTANEOUS
  Filled 2022-09-26 (×7): qty 1

## 2022-09-26 MED ORDER — WARFARIN SODIUM 5 MG PO TABS
5.0000 mg | ORAL_TABLET | Freq: Once | ORAL | Status: AC
  Administered 2022-09-26: 5 mg via ORAL
  Filled 2022-09-26: qty 1

## 2022-09-26 MED ORDER — BISACODYL 5 MG PO TBEC
10.0000 mg | DELAYED_RELEASE_TABLET | Freq: Every day | ORAL | Status: DC
  Administered 2022-09-26 – 2022-09-28 (×3): 10 mg via ORAL
  Filled 2022-09-26 (×3): qty 2

## 2022-09-26 MED ORDER — BOOST / RESOURCE BREEZE PO LIQD CUSTOM
1.0000 | Freq: Three times a day (TID) | ORAL | Status: DC
  Administered 2022-09-26 – 2022-09-28 (×4): 1 via ORAL

## 2022-09-26 NOTE — Progress Notes (Signed)
Initial Nutrition Assessment  DOCUMENTATION CODES:   Not applicable  INTERVENTION:   Recommend no dietary restrictions once diet advanced to solid foods  (i.e. Regular diet)  Hopefully po intake will improve once nausea, pain managed  Boost Breeze po TID, each supplement provides 250 kcal and 9 grams of protein.Marland Kitchen Recommend changing to Ensure once diet advanced   NUTRITION DIAGNOSIS:   Inadequate oral intake related to nausea, poor appetite, acute illness as evidenced by per patient/family report.  GOAL:   Patient will meet greater than or equal to 90% of their needs  MONITOR:   PO intake, Supplement acceptance, Labs, Weight trends  REASON FOR ASSESSMENT:   Consult Assessment of nutrition requirement/status, LVAD Eval  ASSESSMENT:   72 yo male admitted for LVAD placement with recent development of rapidly progressive heart failure. PMH includes DM, acute promyelocytic leukemia s/p ATRA, CHF  2/12 Admitted 2/14 LVAD placed 2/15 Extubated  Pt feels poorly today, +significant nausea and pain. MD has been addressing both. Pt did not eat breakfast this AM, diet downgraded to CL from heart healthy/carb modified due to nausea.  Noted reglan added today, zofran continued. Pt on bowel regimen  Recorded po intake prior to surgery, 100% of meals. No real po intake since post extubation diet advancement  Pt reports good po intake prior to admission with no non-fluid related weight changes  Chest tube 620 mL in 24 hours, 160 mL today  UOP 940 mL in 24 hours, 1.6 L thus far today  Net negative 2L per I/O flow sheet but weight remains up, 88.8 kg  Labs: sodium 133 (L), Creatinine wdl Meds: ss novolog, colace, reglan ,  NUTRITION - FOCUSED PHYSICAL EXAM:  Flowsheet Row Most Recent Value  Orbital Region No depletion  Upper Arm Region No depletion  Thoracic and Lumbar Region No depletion  Buccal Region No depletion  Temple Region No depletion  Clavicle Bone Region No  depletion  Clavicle and Acromion Bone Region No depletion  Scapular Bone Region No depletion  Dorsal Hand No depletion  Patellar Region Unable to assess  Anterior Thigh Region Unable to assess  Posterior Calf Region Unable to assess  Edema (RD Assessment) Mild       Diet Order:   Diet Order             Diet clear liquid Room service appropriate? Yes; Fluid consistency: Thin  Diet effective now                   EDUCATION NEEDS:   Education needs have been addressed  Skin:  Skin Assessment: Skin Integrity Issues: Skin Integrity Issues:: Incisions Incisions: new LVAD-driveline  Last BM:  2/13 large type 2  Height:   Ht Readings from Last 1 Encounters:  09/22/22 5' 9"$  (1.753 m)    Weight:   Wt Readings from Last 1 Encounters:  09/26/22 88.8 kg     BMI:  Body mass index is 28.91 kg/m.  Estimated Nutritional Needs:   Kcal:  2150-2350 kcals  Protein:  110-130 g  Fluid:  >/= 2L    Kerman Passey MS, RDN, LDN, CNSC Registered Dietitian 3 Clinical Nutrition RD Pager and On-Call Pager Number Located in [place]

## 2022-09-26 NOTE — Progress Notes (Signed)
Advanced Heart Failure VAD Team Note  PCP-Cardiologist: [name], MD   Subjective:   2/15 S/P HMIII LVAD . Given 1 PURBCs . Extubated. Switched from Norepi to Vaso 0.04 units.   Currently on Milrinone 0.25 mcg, Epi 4, vaso 0.04 units.   CO-OX 69%  RA 17-18 PA 50/20 (30) CO4.4 CI 2.2  Complaining of nausea overnight. Given zofran with some improvement. Concerned about constipation.   LVAD INTERROGATION:  HeartMate III LVAD:   Flow 3.2 liters/min, speed 5100, power 4, PI 5.2. Rare PI events.   Objective:    Vital Signs:   Temp:  [97.5 F (36.4 C)-99.7 F (37.6 C)] 98.6 F (37 C) (02/16 0700) Pulse Rate:  [62-249] 97 (02/16 0700) Resp:  [13-30] 30 (02/16 0700) BP: (73-105)/(57-94) 105/94 (02/16 0700) SpO2:  [84 %-100 %] 98 % (02/16 0700) Arterial Line BP: (73-112)/(60-87) 112/87 (02/16 0700) FiO2 (%):  [40 %-50 %] 40 % (02/15 0956) Weight:  [88.8 kg] 88.8 kg (02/16 0300) Last BM Date : 09/23/22 Mean arterial Pressure 90s  Intake/Output:   Intake/Output Summary (Last 24 hours) at 09/26/2022 0707 Last data filed at 09/26/2022 0700 Gross per 24 hour  Intake 2780.87 ml  Output 1560 ml  Net 1220.87 ml    CVP 17-18 Physical Exam    Physical Exam: GENERAL: No acute distress. HEENT: normal  NECK: Supple, JVP  to jaw .  2+ bilaterally, no bruits.  No lymphadenopathy or thyromegaly appreciated.  RIJ swan CARDIAC:  Mechanical heart sounds with LVAD hum present.  LUNGS:  Clear to auscultation bilaterally.  ABDOMEN:  Soft, round, nontender, sluggish bowel sounds x4.     LVAD exit site:  Dressing dry and intact.  No erythema or drainage.    EXTREMITIES:  Warm and dry, no cyanosis, clubbing, rash. R and LLE 2+  or edema . L radial art line.  NEUROLOGIC:  Alert and oriented x 3.    No aphasia.  No dysarthria.  Affect pleasant.    GU: foley    Telemetry  SR 90s   EKG    N/A   Labs   Basic Metabolic Panel: Recent Labs  Lab 09/24/22 0324 09/24/22 0431  09/24/22 1338 09/24/22 1534 09/24/22 1537 09/24/22 2127 09/24/22 2159 09/25/22 0258 09/25/22 0507 09/25/22 1017 09/25/22 1202 09/25/22 1600 09/26/22 0348  NA 136   < > 141 138   < >  --    < > 137 138 138 137 134*  137 133*  K 3.8   < > 3.5 3.4*   < >  --    < > 4.4 4.6 4.8 4.6 4.5  4.6 4.7  CL 101   < > 102 103  --   --   --  105  --   --   --  105 102  CO2 26  --   --  25  --   --   --  22  --   --   --  22 22  GLUCOSE 122*   < > 193* 133*  --   --   --  117*  --   --   --  136* 104*  BUN 22   < > 24* 19  --   --   --  17  --   --   --  16 16  CREATININE 1.14   < > 1.00 1.17  --   --   --  1.08  --   --   --  1.04 0.92  CALCIUM 9.2  --   --  8.7*  --   --   --  8.4*  --   --   --  8.2* 8.4*  MG 2.0  --   --  2.0  --  2.6*  --  2.4  --   --   --  2.4 2.3  PHOS  --   --   --   --   --   --   --  3.1  --   --   --   --  2.5   < > = values in this interval not displayed.    Liver Function Tests: Recent Labs  Lab 09/19/22 1330 09/23/22 0416 09/25/22 0258 09/26/22 0348  AST 25 19 54* 70*  ALT 30 24 20 20  $ ALKPHOS 76 66 43 49  BILITOT 1.2 1.0 1.9* 1.4*  PROT 7.1 6.4* 6.1* 6.4*  ALBUMIN 3.6 3.3* 3.9 3.8   No results for input(s): "LIPASE", "AMYLASE" in the last 168 hours. No results for input(s): "AMMONIA" in the last 168 hours.  CBC: Recent Labs  Lab 09/24/22 1534 09/24/22 1537 09/24/22 2127 09/24/22 2159 09/25/22 0258 09/25/22 0507 09/25/22 1017 09/25/22 1202 09/25/22 1600 09/26/22 0348  WBC 15.4*  --  13.1*  --  10.8*  --   --   --  14.5* 15.5*  NEUTROABS  --   --   --   --  8.9*  --   --   --   --  13.4*  HGB 10.4*   < > 8.9*   < > 9.7* 10.2* 10.2* 9.9* 9.8*  10.2* 9.6*  HCT 30.3*   < > 25.9*   < > 29.5* 30.0* 30.0* 29.0* 28.5*  30.0* 28.6*  MCV 91.5  --  92.2  --  91.6  --   --   --  91.9 92.3  PLT 132*  132*  --  131*  --  137*  --   --   --  118* 126*   < > = values in this interval not displayed.    INR: Recent Labs  Lab 09/24/22 0324  09/24/22 1534 09/25/22 0258 09/26/22 0348  INR 1.3* 1.7* 1.6* 1.7*    Other results: EKG:    Imaging   DG CHEST PORT 1 VIEW  Result Date: 09/26/2022 CLINICAL DATA:  Pulmonary edema with congestive heart failure EXAM: PORTABLE CHEST 1 VIEW COMPARISON:  Yesterday FINDINGS: LVAD in place. The trachea and esophagus have been extubated. Swan-Ganz catheter with tip at the main pulmonary artery level. Postoperative pneumomediastinum. Chest tubes remain. Trace pleural fluid is possible. Mild vascular congestion. No visible pneumothorax. IMPRESSION: 1. Unchanged remaining hardware. 2. Atelectasis and vascular congestion. 3. No pneumothorax. Electronically Signed   By: Jorje Guild M.D.   On: 09/26/2022 06:41   DG Chest Port 1 View  Result Date: 09/25/2022 CLINICAL DATA:  Heart failure. EXAM: PORTABLE CHEST 1 VIEW COMPARISON:  September 24, 2022. FINDINGS: Stable cardiomegaly. Left ventricular assist device is noted. Endotracheal and nasogastric tubes are again noted. Right internal jugular Swan-Ganz catheter is noted with tip in expected position of main pulmonary artery. Right lung is clear. Minimal left basilar subsegmental atelectasis is noted. Bony thorax is unremarkable. IMPRESSION: Stable support apparatus. Minimal left basilar subsegmental atelectasis. Electronically Signed   By: Marijo Conception M.D.   On: 09/25/2022 07:51   DG Chest Port 1 View  Result Date: 09/24/2022 CLINICAL DATA:  Heart failure EXAM: PORTABLE  CHEST 1 VIEW COMPARISON:  Chest x-ray 09/23/2022 FINDINGS: There are new sternotomy wires. Endotracheal tube tip is 4.7 cm above the carina. Swan-Ganz catheter tip is at the level of the main pulmonary artery. Enteric tube extends below the diaphragm. New LVAD in place. The heart is enlarged. There central pulmonary vascular congestion. There is no focal lung infiltrate, pleural effusion or pneumothorax. No acute fractures are seen. IMPRESSION: 1. Cardiomegaly with central pulmonary  vascular congestion. 2. Support lines and tubes as above. Electronically Signed   By: Ronney Asters M.D.   On: 09/24/2022 16:02   ECHO INTRAOPERATIVE TEE  Result Date: 09/24/2022  *INTRAOPERATIVE TRANSESOPHAGEAL REPORT *  Patient Name:   Ricardo Zuniga Date of Exam: 09/24/2022 Medical Rec #:  [id number]        Height:       69.0 in Accession #:    [id number]       Weight:       181.0 lb Date of Birth:  08/26/1950        BSA:          1.98 m Patient Age:    72 years         BP:           110/75 mmHg Patient Gender: M                HR:           70 bpm. Exam Location:  Anesthesiology Transesophogeal exam was perform intraoperatively during surgical procedure. Patient was closely monitored under general anesthesia during the entirety of examination. Indications:     Heart Failure Performing Phys: Suella Broad MD PROCEDURE: Intraoperative Transesophogeal LVAD. Complications: No known complications during this procedure.            POST-OP IMPRESSIONS s/p LVAD placement & Aortic Valve closure _ Left Ventricle: LVAD in place. Inflow cannula in good position. No bowing or suction events noted. _ Right Ventricle: Improved RV function with inotrope support. _ Aorta: No dissection noted. _ Left Atrium: The left atrium appears unchanged from pre-bypass. _ Left Atrial Appendage: The left atrial appendage appears unchanged from pre-bypass. _ Aortic Valve: No significant insufficiency noted by color doppler. Leaflets fixed in the closed position. _ Mitral Valve: The mitral valve appears unchanged from pre-bypass. _ Tricuspid Valve: The tricuspid valve appears unchanged from pre-bypass. _ Pulmonic Valve: The pulmonic valve appears unchanged from pre-bypass. _ Interatrial Septum: The interatrial septum appears unchanged from pre-bypass. _ Interventricular Septum: The interventricular septum appears unchanged from pre-bypass. _ Pericardium: No pericardial effusion is present.  - Left Ventricular Assist Device; Ramp Study: The  aortic valve does not appear to open, consistent with appropriate LVAD function and no significant native left ventricular ejection. The inflow cannula was visualized. LVAD appropriately decompressed. LVAD function appears to be normal. PRE-OP FINDINGS  Left Ventricle: The left ventricle ejection fraction is 15 - 20%. The cavity size was moderately dilated. Left ventricular severe diffuse hypokinesis. There is no left ventricular hypertrophy. Left ventricular diastolic function was not evaluated. Right Ventricle: The right ventricle has moderatly reduced systolic function. The cavity was dilated. There is no increase in right ventricular wall thickness. Right ventricular systolic pressure is moderately elevated. Catheter present in the right ventricle. There is no aneurysm seen. Left Atrium: Left atrial size was dilated. No left atrial/left atrial appendage thrombus was detected. The left atrial appendage is well visualized and there is no evidence of thrombus present. Left atrial appendage velocity is  reduced at less than 40 cm/s. Right Atrium: Right atrial size is dilated in size. Catheter present in the right atrium. Interatrial Septum: No atrial level shunt detected by color flow Doppler. Agitated saline contrast bubble study was negative, with no evidence of any interatrial shunt. There is no evidence of a patent foramen ovale. Pericardium: A small pericardial effusion is present. The pericardial effusion is circumferential. There is no pleural effusion. Mitral Valve: The mitral valve is normal in structure. Mitral valve regurgitation is mild to moderate by color flow Doppler. The MR jet is centrally-directed. There is no evidence of mitral valve vegetation. There is no evidence of mitral stenosis. Tricuspid Valve: The tricuspid valve was normal in structure. Tricuspid valve regurgitation is mild by color flow Doppler. The jet is directed centrally. No evidence of tricuspid stenosis is present. There is no  evidence of tricuspid valve vegetation. Aortic Valve: The aortic valve is tricuspid with moderate aortic regurgitation by color flow Doppler. The jet is centrally-directed. There is no stenosis of the aortic valve. There is no evidence of aortic valve vegetation. Pulmonic Valve: The pulmonic valve was normal in structure, with normal leaflet motion. No evidence of pumonic stenosis. Pulmonic valve regurgitation is mild by color flow Doppler. Aorta: The aortic root and ascending aorta are normal in size and structure. There is evidence of plaque in the aortic arch and descending aorta; Grade I, measuring 1-27m in size. Pulmonary Artery: SGordy Councilmancatheter present on the right. The pulmonary artery is of normal size. Pulmonary hypertension is moderate. Venous: The inferior vena cava was not well visualized. Shunts: There is no evidence of an atrial septal defect. +-------------+--------++ AORTIC VALVE          +-------------+--------++ AV Mean Grad:3.0 mmHg +-------------+--------++  KSuella BroadMD Electronically signed by KSuella BroadMD Signature Date/Time: 09/24/2022/3:47:55 PM    Final    EP STUDY  Result Date: 09/24/2022 See surgical note for result.    Medications:     Scheduled Medications:  sodium chloride   Intravenous Once   sodium chloride   Intravenous Once   acetaminophen  1,000 mg Oral Q6H   amiodarone  200 mg Oral BID   aspirin EC  325 mg Oral Daily   Or   aspirin  324 mg Per Tube Daily   Or   aspirin  300 mg Rectal Daily   atorvastatin  40 mg Oral Daily   bisacodyl  10 mg Oral Daily   Or   bisacodyl  10 mg Rectal Daily   Chlorhexidine Gluconate Cloth  6 each Topical Daily   digoxin  0.0625 mg Oral Daily   docusate sodium  200 mg Oral Daily   pantoprazole  40 mg Oral Daily   polyethylene glycol  17 g Oral Daily   sodium chloride flush  10-40 mL Intracatheter Q12H   sodium chloride flush  3 mL Intravenous Q12H    Infusions:  sodium chloride 10 mL/hr at 09/26/22  0700   sodium chloride     sodium chloride 10 mL/hr at 09/26/22 0700    ceFAZolin (ANCEF) IV Stopped (09/26/22 0533)   dexmedetomidine (PRECEDEX) IV infusion Stopped (09/25/22 0549)   epinephrine 4 mcg/min (09/26/22 0700)   insulin 2.2 Units/hr (09/26/22 0700)   lactated ringers     lactated ringers     lactated ringers 20 mL/hr at 09/26/22 0700   milrinone 0.25 mcg/kg/min (09/26/22 0700)   promethazine (PHENERGAN) injection (IM or IVPB) Stopped (09/25/22 1615)   vasopressin 0.04  Units/min (09/26/22 0700)    PRN Medications: sodium chloride, acetaminophen, albuterol, dextrose, lactated ringers, morphine injection, ondansetron (ZOFRAN) IV, mouth rinse, mouth rinse, mouth rinse, oxyCODONE, promethazine (PHENERGAN) injection (IM or IVPB), sodium chloride flush, sodium chloride flush, traMADol, traZODone   Patient Profile  72 y/o male w/ long standing h/o chronic systolic heart failure due to nonischemic cardiomyopathy, T2DM and hx of acute promyelocytic leukemia s/p ATRA ([facility] 2014). Recent development of rapidly progressive heart failure,  NYHA class IIIb-IV symptoms. Admitted for for RHC and optimization prior to VAD implant.   RHC w/ elevated filling pressures and low output (RA 13, PAP 64/26 (40), PCWP 23, CI 2 L/min/m2) =>started on Milrinone  2/14 S/P HMIII VAD   Assessment/Plan:   1. Chronic HFrEF, NICM -> 09/24/22 S/P HMIII LVAD  -Remains intubated on iNO. Extubation later this morning.  -Currently on vaso 0. .04, Epi 4 mcg, and milrinone 0.25 mcg.  -CO-OX 69% RA 17-18 PA 50/20 (30) CO 4.4 CI 2.2 - Consider IV lasix when ok with surgery. CVP rising. CXR wet. Renal function stable.  -Continue Aspirin 325 mg until INR adequate.  -LDH 284>338  - INR 1.7  - LVAD parameters with rare PI events.  - Ramp echo once off pressors 2.Acute blood loss anemia Given 1UPRBCs  8.8>9.7>9.6   Follow daily CBC.  3.  NSVT/PVCS Rare PVCs.  4.  Hx of promyelocytic leukemia: S/P ATRA in 2014  at [facility].  5. . Hx of endocarditis: 2015   I reviewed the LVAD parameters from today, and compared the results to the patient's prior recorded data.  No programming changes were made.  The LVAD is functioning within specified parameters.  The patient performs LVAD self-test daily.  LVAD interrogation was negative for any significant power changes, alarms or PI events/speed drops.  LVAD equipment check completed and is in good working order.  Back-up equipment present.   LVAD education done on emergency procedures and precautions and reviewed exit site care.  Length of Stay: 4  Remmy Crass, NP 09/26/2022, 7:07 AM  VAD Team --- VAD ISSUES ONLY--- Pager [phone] (7am - 7am)  Advanced Heart Failure Team  Pager [phone] (M-F; 7a - 5p)  Please contact [facility] for night-coverage after hours (5p -7a ) and weekends on [email or web address]

## 2022-09-26 NOTE — Progress Notes (Signed)
NAME:  Ricardo Zuniga, MRN:  [id number], DOB:  [birth year], LOS: 4 ADMISSION DATE:  09/22/2022, CONSULTATION DATE:  09/24/2022 REFERRING MD:  Enter (CT surgery), CHIEF COMPLAINT: Vent management s/p LVAD implantation  History of Present Illness:  The patient is a 72 yo M with PMH of HFrEF (possibly d/t endocarditis), T2DM, APML s/p ATRA in 2014 who just underwent LVAD implantation on 2/14.  Was previously admitted in 05/2022 for orthopnea, PND, and LE edema and echo showed LVEF of 30-35%.  Heart cath showed CAD and patient was discharged on GDMT.  Repeat echo 1/24 showed EF 25%, severe LV dilation, global hypokinesis, moderate RV dysfunction, mild RV enlargement, and mild-to moderate MR; rapid deterioration from previous health.  LVAD implantation was urgently planned.   Procedure was successful and patient transferred to ICU 2/14 PM, sedated and intubated.  Did require Glide for difficult airway.  Received rifampin, vancomycin, and cefazolin for preoperative prophylaxis.  Transferred to ICU on pressors, NO, and milrinone, weaning off ventilator and NO.  Extubated morning 2/15 and working on recovery since.  Pertinent  Medical History   Diagnosis   APML (acute promyelocytic leukemia) in remission ([place])    Completed treatment 04/2013   HFrEF    EF 25% on 09/03/2022   Coronary artery disease    Nonobstructive at cardiac catheterization 09/2018   Gastroesophageal reflux disease   Hypercholesteremia   Neuropathy    bilateral feet   Nonischemic cardiomyopathy ([place])    a. EF 20% in 2015 - thought to possibly be viral induced or chemotherapy induced from treatments in 2014 b. at 35-40% by echo in 05/2016    Peptic ulcer disease   Type 2 diabetes mellitus ([place])   Vertigo    tx with valium prn   Significant Hospital Events: Including procedures, antibiotic start and stop dates in addition to other pertinent events   2/12 - Admitted for RHC and optimization 2/14 - LVAD implantation, transferred  to ICU 2/15 - Extubated, progressive ambulation and diet 2/16 - Nausea, still no BM, clear liquid diet as tolerated.  Interim History / Subjective:  This morning, the patient remains minimally short of breath but is unable to use his incentive spirometer in the setting of increased nausea.  Now getting Reglan for said nausea. Otherwise on 4 epi, 0.25 milrinone, and 0.04 vasopressin, which is being weaned.  Receiving Lasix 40 mg BID today.  Has not yet had a BM since his surgery (2 days).  Objective   Blood pressure (!) 105/94, pulse 97, temperature 98.6 F (37 C), resp. rate (!) 30, height 5' 9"$  (1.753 m), weight 88.8 kg, SpO2 98 %. PAP: (33-61)/(12-31) 61/31 CVP:  [8 mmHg-25 mmHg] 25 mmHg CO:  [3.8 L/min-5.5 L/min] 5.5 L/min CI:  [1.9 L/min/m2-2.8 L/min/m2] 2.8 L/min/m2  Vent Mode: CPAP;PSV FiO2 (%):  [40 %-50 %] 40 % Set Rate:  [15 bmp] 15 bmp Vt Set:  [560 mL] 560 mL PEEP:  [5 cmH20] 5 cmH20 Pressure Support:  [10 cmH20] 10 cmH20 Plateau Pressure:  [17 cmH20] 17 cmH20   Intake/Output Summary (Last 24 hours) at 09/26/2022 0730 Last data filed at 09/26/2022 0700 Gross per 24 hour  Intake 2780.87 ml  Output 1560 ml  Net 1220.87 ml   Filed Weights   09/24/22 0451 09/25/22 0500 09/26/22 0300  Weight: 82.1 kg 88 kg 88.8 kg   Examination: General: Elderly male resting in bed, in no acute distress but moderately uncomfortable. HEENT: MMM. PERRLA. Lungs: Mild bibasilar rhonchi. Normal  WOB on 4 L [place]. Cardiovascular: LVAD mechanical hum audible. Normal Sternal incision dressed and not leaking. No crepitus or fluctuance palpable. Abdomen: Nontender, mild distension. No guarding. Normoactive bowel sounds. GU: Foley catheter placed and draining amber to clear urine. Extremities: 1+ BLE edema. Warm, well-perfused. Capillary refill < 2 seconds. Skin: Warm, dry, no ecchymosis. Neuro: A&Ox4, moving all extremities equally.  [facility]  LDH: 284 > 338 INR: 1.7 > 1.6 >  1.7 WBC: 10.8 > 14.5 > 15.5 Hgb: 10.2 > 9.8 > 9.6  CXR 2/16: Increased atelectasis, vascular congestion.  Assessment & Plan:  HFrEF (NYHA class III) s/p LVAD implantation Postoperative expected cardiogenic and vasoplegic shock Milrinone also assisting with decreasing pulmonary resistance to prevent RV overload. - Milrinone 0.25 and Epi 4 per CT surgery - Weaning vasopressin, MAP goal 75-85 - Morphine IM, oxycodone, and tramadol for pain control, minimize morphine in setting of constipation - BMP and Mag with goal K > 4 and Mg > 2, replete as needed - CBC and trend H&H - Foley in place, monitory I/Os closely - Continuous cardiac monitoring - Lasix 40 mg BID, the reevaluate after today  Nausea Constipation Goal to avoid vomiting to prevent aspiration.  Also monitoring for first BM following surgery, opioid analgesics likely contributing to constipation but necessary in setting of postoperative chest pain and to encourage incentive spirometry. - Reglan for nausea - Miralax, bisacodyl, Colace BID bowel regimen - Now on clear liquid diet, advance as tolerated  T2DM A1c 7.0 this admission. - Q4 CBGs - SSI   HLD - Holding home atorvastatin in setting of recent surgery  Best Practice (right click and "Reselect all SmartList Selections" daily)   Diet/type: clear liquids, advance as tolerated DVT prophylaxis: SCD GI prophylaxis: H2B Lines: Central line, Arterial Line, and yes and it is still needed Foley:  Yes, and it is still needed Code Status:  full code Last date of multidisciplinary goals of care discussion: updated patient and wife in room 2/16  Iantha Titsworth [place], MS4 09/26/2022, 7:30 AM Johnell Comings, PCCM

## 2022-09-26 NOTE — Evaluation (Signed)
Physical Therapy Evaluation Patient Details Name: Ricardo Zuniga MRN: [id number] DOB: Oct 18, 1950 Today's Date: 09/26/2022  History of Present Illness  Pt is 72 year old [facility]  [facility]  for RHC prior to LVAD placement on 09/24/22. PMH - acute leukemia, nonischemic cardiomyopathy, multiple back surgeries, neck fusion, DM, neuropathy, shoulder surgery x 3.  Clinical Impression  Pt admitted with above diagnosis and presents to PT with functional limitations due to deficits listed below (See PT problem list). Pt needs skilled PT to maximize independence and safety to allow discharge to home with family. Expect pt will make good progress and will likely be ambulatory on his own at dc. Will continue to work to [employer] mobility and LVAD education.        Recommendations for follow up therapy are one component of a multi-disciplinary discharge planning process, led by the attending physician.  Recommendations may be updated based on patient status, additional functional criteria and insurance authorization.  Follow Up Recommendations Home health PT      Assistance Recommended at Discharge Intermittent Supervision/Assistance  Patient can return home with the following  Help with stairs or ramp for entrance;Assist for transportation;Assistance with cooking/housework;A little help with walking and/or transfers    Equipment Recommendations Other (comment) (To be determined)  Recommendations for Other Services       Functional Status Assessment Patient has had a recent decline in their functional status and demonstrates the ability to make significant improvements in function in a reasonable and predictable amount of time.     Precautions / Restrictions Precautions Precautions: Sternal;Fall;Other (comment) Precaution Booklet Issued: No Precaution Comments: LVAD      Mobility  Bed Mobility Overal bed mobility: Needs Assistance Bed Mobility: Supine to Sit, Sit to Sidelying      Supine to sit: +2 for physical assistance, Mod assist   Sit to sidelying: +2 for physical assistance, Mod assist General bed mobility comments: Assist to elevate trunk into sitting and bring hips to EOB. Assist lower trunk and bring legs back up into bed.    Transfers Overall transfer level: Needs assistance Equipment used: 2 person hand held assist Transfers: Sit to/from Stand Sit to Stand: +2 physical assistance, Min assist           General transfer comment: Assist to bring hips up and to stabilize. Pt holding heart pillow for sternal precautions    Ambulation/Gait             Pre-gait activities: Side stepped up side of be 2' with +2 mod assist. Pt with significant posterior lean    Stairs            Wheelchair Mobility    Modified Rankin (Stroke Patients Only)       Balance Overall balance assessment: Needs assistance Sitting-balance support: No upper extremity supported, Feet supported Sitting balance-Leahy Scale: Fair     Standing balance support: Bilateral upper extremity supported, During functional activity Standing balance-Leahy Scale: Poor Standing balance comment: Bilateral HHA with min assist for static standing                             Pertinent Vitals/Pain Pain Assessment Pain Assessment: Faces Faces Pain Scale: Hurts even more Pain Location: pocket pain, chest Pain Descriptors / Indicators: Guarding, Operative site guarding Pain Intervention(s): Premedicated before session, Limited activity within patient's tolerance, Monitored during session, Repositioned    Home Living Family/patient expects to be discharged to::  Private residence Living Arrangements: Spouse/significant other Available Help at Discharge: Family;Available 24 hours/day Type of Home: House Home Access: Stairs to enter Entrance Stairs-Rails: None Entrance Stairs-Number of Steps: 1 Alternate Level Stairs-Number of Steps: 5-6 landing 5-6 to bed  room Home Layout: Two level;Able to live on main level with bedroom/bathroom;Bed/bath upstairs;Full bath on main level Home Equipment: Rolling Walker (2 wheels);Cane - single point;Shower seat - built in      Prior Function Prior Level of Function : Independent/Modified Independent;Driving                     Hand Dominance   Dominant Hand: Right    Extremity/Trunk Assessment   Upper Extremity Assessment Upper Extremity Assessment: Defer to OT evaluation    Lower Extremity Assessment Lower Extremity Assessment: Generalized weakness       Communication   Communication: No difficulties  Cognition Arousal/Alertness: Awake/alert Behavior During Therapy: WFL for tasks assessed/performed Overall Cognitive Status: Within Functional Limits for tasks assessed                                          General Comments General comments (skin integrity, edema, etc.): VSS    Exercises     Assessment/Plan    PT Assessment Patient needs continued PT services  PT Problem List Decreased strength;Decreased activity tolerance;Decreased balance;Decreased mobility;Decreased knowledge of precautions;Pain       PT Treatment Interventions DME instruction;Gait training;Stair training;Functional mobility training;Therapeutic activities;Therapeutic exercise;Balance training;Patient/family education    PT Goals (Current goals can be found in the Care Plan section)  Acute Rehab PT Goals Patient Stated Goal: return home PT Goal Formulation: With patient Time For Goal Achievement: 10/10/22 Potential to Achieve Goals: Good    Frequency Min 3X/week     Co-evaluation               AM-PAC PT "6 Clicks" Mobility  Outcome Measure Help needed turning from your back to your side while in a flat bed without using bedrails?: A Lot Help needed moving from lying on your back to sitting on the side of a flat bed without using bedrails?: Total Help needed moving to and  from a bed to a chair (including a wheelchair)?: Total Help needed standing up from a chair using your arms (e.g., wheelchair or bedside chair)?: Total Help needed to walk in hospital room?: Total Help needed climbing 3-5 steps with a railing? : Total 6 Click Score: 7    End of Session Equipment Utilized During Treatment: Oxygen Activity Tolerance: Patient tolerated treatment well Patient left: in bed;with call bell/phone within reach;with nursing/sitter in room Nurse Communication: Mobility status (nurse assisted with mobility) PT Visit Diagnosis: Other abnormalities of gait and mobility (R26.89);Muscle weakness (generalized) (M62.81);Pain Pain - part of body:  (chest, pocket pain)    Time: [id number] PT Time Calculation (min) (ACUTE ONLY): 20 min   Charges:   PT Evaluation $PT Eval High Complexity: 1 High          Nnamdi Dacus [place] 09/26/2022, 6:04 PM

## 2022-09-26 NOTE — Progress Notes (Deleted)
OT Cancellation Note  Patient Details Name: Ricardo Zuniga MRN: [id number] DOB: 1950-11-13   Cancelled Treatment:    Reason Eval/Treat Not Completed: Medical issues which prohibited therapy.  Very SOB and on 40L O2 and going to CT.  OT to continue efforts as appropriate.    Daivd Fredericksen D Kealani Leckey 09/26/2022, 8:59 AM 09/26/2022  RP, OTR/L  Acute Rehabilitation Services  Office:  [phone]

## 2022-09-26 NOTE — Progress Notes (Signed)
LVAD Coordinator Rounding Note:  Admitted 09/22/22 to Dr. Conception Chancy service for [place] and optimization prior to VAD implant.   HeartMate III LVAD implanted on 09/25/22 by Dr.Enter under DT criteria.   Patient awake and alert this morning complaining of soreness in chest along with ongoing nausea. Nursing reported patient was dry heaving overnight. Patient states he feels phlegm in his throat and when he tries to cough he starts dry heaving. Dicussed in morning rounds will continue Zofran and add Reglan. Patient to have clear liquids as tolerated. Scheduled bowel regimen ordered per protocol.  Patient VAD flows at 3.2L this morning of note patient is more hypertensive. Plan to wean Vasopressin for a MAP of 75-85 per MD. Epi and Milrinone to remain at current rate until otherwise specified by MD.Pain regimen dicussed at bedside with providers. Patient received IV Morphine overnight plan to incorporate p.o. pain medications today for better control.   CVP 16 this morning. UOP 425 overnight. CT output 260 overnight serosanguinous fluid. Plan to diurese with 70m of IV Lasix today.   Vital signs: Temp: 99.7  HR:  86 Arterial line BP:  84/69 (77) Doppler MAP:76 O2 Sat: 98 Wt: 194lbs  LVAD interrogation reveals:  Speed:  5100 Flow: 3.6 Power:3.5w PI: 4.1 Alarms: none  Events: none Fixed speed:  5100 Low speed limit: 4800  Drive Line:  Existing VAD dressing CDI. Plan to change dressing with VOrlie Dakin(wife) this afternoon. See separate note for details.  Labs:  Hgb:  9.7>9.6  WBC:  10.8>15.5  LDH trend:  284>338  INR trend:  1.6>1.7  Co-ox: 74.2>68.7  Creatinine: 1.4>0.92  LDH: 284>338  Intra-op blood products: 4 FFP Cell Saver 650 mL  ICU Blood Products: 09/25/22 1 PRBC  Arrhythmias:   Gtts: Epinephrine: 469m/min Vasopressin: 0.04 units/min plan to wean today for a MAP of 75-85 per Dr. EnTenny Crawilrinone: 0.2565mkg/min  Ventilator Status:  Extubated 09/25/22  Nitric  oxide:  Weaned off 09/25/22  Anticoagulation Plan: INR Goal: 2-2.5 ASA dose:324m30mumadin 5mg 29mnned to start tonight  Adverse Events on VAD:   Patient Education: Patient's wife VanesLorriane Shirerved dressing change. Will plan to meet this afternoon with VanesLorriane Shireher to perform dressing change with assistance from VAD CBufordlan/Recommendations:  1. Page VAD coordinator for any issues with VAD equipment or driveline. 2. Daily dressing changes by VAD coordinator or nurse champion.   OliviBobbye MortonBSN VAD Coordinator 24/7 Pager [phone]

## 2022-09-26 NOTE — Progress Notes (Signed)
T[facility] Progress Note: 2 Days Post-Op Procedure(s) (LRB): INSERTION OF IMPLANTABLE LEFT VENTRICULAR ASSIST DEVICE AND AORTIC VALVE CLOSURE (N/A) TRANSESOPHAGEAL ECHOCARDIOGRAM (N/A)  LOS: 4 days   Nausea no vomiting Abdomen not ttp outside of driveline  [place] - CVP up to 18. MAPs of 90  VAD flows 3.9L on 5100.   Plan today: 40 lasix Wean vaso to MAP 75-85 Keep floor of epi 4 and milrinone 0.25 Start DVT ppx Coumadin 53m Reglan - schedule it Give pain meds TID. Can be be either tramadol or oxy Scheduled bowel regimen (bisacodyl / docusate / miralax are all scheduled) Insulin transition Limit to clears for nausea     Latest Ref Rng & Units 09/26/2022    3:48 AM 09/25/2022    4:00 PM 09/25/2022   12:02 PM  CBC  WBC 4.0 - 10.5 K/uL 15.5  14.5    Hemoglobin 13.0 - 17.0 g/dL 9.6  9.8    10.2  9.9   Hematocrit 39.0 - 52.0 % 28.6  28.5    30.0  29.0   Platelets 150 - 400 K/uL 126  118         Latest Ref Rng & Units 09/26/2022    3:48 AM 09/25/2022    4:00 PM 09/25/2022   12:02 PM  CMP  Glucose 70 - 99 mg/dL 104  136    BUN 8 - 23 mg/dL 16  16    Creatinine 0.61 - 1.24 mg/dL 0.92  1.04    Sodium 135 - 145 mmol/L 133  134    137  137   Potassium 3.5 - 5.1 mmol/L 4.7  4.5    4.6  4.6   Chloride 98 - 111 mmol/L 102  105    CO2 22 - 32 mmol/L 22  22    Calcium 8.9 - 10.3 mg/dL 8.4  8.2    Total Protein 6.5 - 8.1 g/dL 6.4     Total Bilirubin 0.3 - 1.2 mg/dL 1.4     Alkaline Phos 38 - 126 U/L 49     AST 15 - 41 U/L 70     ALT 0 - 44 U/L 20       ABG    Component Value Date/Time   PHART 7.284 (L) 09/25/2022 1600   PCO2ART 46.5 09/25/2022 1600   PO2ART 105 09/25/2022 1600   HCO3 22.2 09/25/2022 1600   TCO2 24 09/25/2022 1600   ACIDBASEDEF 5.0 (H) 09/25/2022 1600   O2SAT 68.7 09/26/2022 0405    Vent Mode: CPAP;PSV FiO2 (%):  [40 %] 40 % PEEP:  [5 cmH20] 5 cmH20 Pressure Support:  [10 cmH20] 10 cmH20

## 2022-09-26 NOTE — Progress Notes (Signed)
Inpatient Diabetes Program Recommendations  AACE/ADA: New Consensus Statement on Inpatient Glycemic Control (2015)  Target Ranges:  Prepandial:   less than 140 mg/dL      Peak postprandial:   less than 180 mg/dL (1-2 hours)      Critically ill patients:  140 - 180 mg/dL   Lab Results  Component Value Date   GLUCAP 130 (H) 09/26/2022   HGBA1C 7.0 (H) 09/24/2022    Review of Glycemic Control  Latest Reference Range & Units 09/26/22 01:25 09/26/22 02:33 09/26/22 03:59 09/26/22 05:06 09/26/22 07:06 09/26/22 09:16  Glucose-Capillary 70 - 99 mg/dL 167 (H) 127 (H) 119 (H) 118 (H) 111 (H) 130 (H)   Diabetes history: DM  Outpatient Diabetes medications:  Prandin 2-4 mg daily, Jardiance 10 mg q PM Current orders for Inpatient glycemic control:  Novolog 0-24 units q 4 hours  Inpatient Diabetes Program Recommendations:    If blood sugars>180 mg/dL, consider adding Levemir 8 units bid.   Thanks,  Adah Perl, RN, BC-ADM Inpatient Diabetes Coordinator Pager [phone]  (8a-5p)

## 2022-09-26 NOTE — Progress Notes (Addendum)
Drive Line:  Existing VAD dressing removed and site care performed using sterile technique by caregiver [name] with VAD Coordinator supervision. Drive line exit site cleaned with Chlora prep applicators x 2, allowed to dry, and gauze dressing with Silver strip applied. Exit site with 3 sutures and unincorporated, the velour is fully implanted at exit site. Scant amount of bloody drainage. No redness, tenderness, foul odor or rash noted. Drive line anchor re-applied. Advance to every other day dressing changes by VAD coordinator or Nurse champion. Next dressing change due 09/28/22 by nurse champion.       Bobbye Morton RN, BSN VAD Coordinator 24/7 Pager [phone]

## 2022-09-27 ENCOUNTER — Inpatient Hospital Stay (HOSPITAL_COMMUNITY): Payer: Medicare PPO

## 2022-09-27 DIAGNOSIS — R570 Cardiogenic shock: Secondary | ICD-10-CM | POA: Diagnosis not present

## 2022-09-27 DIAGNOSIS — E871 Hypo-osmolality and hyponatremia: Secondary | ICD-10-CM | POA: Diagnosis not present

## 2022-09-27 DIAGNOSIS — I5023 Acute on chronic systolic (congestive) heart failure: Secondary | ICD-10-CM | POA: Diagnosis not present

## 2022-09-27 DIAGNOSIS — N179 Acute kidney failure, unspecified: Secondary | ICD-10-CM | POA: Diagnosis not present

## 2022-09-27 LAB — CBC WITH DIFFERENTIAL/PLATELET
Abs Immature Granulocytes: 0.06 10*3/uL (ref 0.00–0.07)
Abs Immature Granulocytes: 0.1 10*3/uL — ABNORMAL HIGH (ref 0.00–0.07)
Basophils Absolute: 0 10*3/uL (ref 0.0–0.1)
Basophils Absolute: 0 10*3/uL (ref 0.0–0.1)
Basophils Relative: 0 %
Basophils Relative: 0 %
Eosinophils Absolute: 0 10*3/uL (ref 0.0–0.5)
Eosinophils Absolute: 0 10*3/uL (ref 0.0–0.5)
Eosinophils Relative: 0 %
Eosinophils Relative: 0 %
HCT: 27.6 % — ABNORMAL LOW (ref 39.0–52.0)
HCT: 27.9 % — ABNORMAL LOW (ref 39.0–52.0)
Hemoglobin: 8.8 g/dL — ABNORMAL LOW (ref 13.0–17.0)
Hemoglobin: 9.1 g/dL — ABNORMAL LOW (ref 13.0–17.0)
Immature Granulocytes: 0 %
Immature Granulocytes: 1 %
Lymphocytes Relative: 3 %
Lymphocytes Relative: 5 %
Lymphs Abs: 0.5 10*3/uL — ABNORMAL LOW (ref 0.7–4.0)
Lymphs Abs: 0.7 10*3/uL (ref 0.7–4.0)
MCH: 30.3 pg (ref 26.0–34.0)
MCH: 30.7 pg (ref 26.0–34.0)
MCHC: 31.9 g/dL (ref 30.0–36.0)
MCHC: 32.6 g/dL (ref 30.0–36.0)
MCV: 94.3 fL (ref 80.0–100.0)
MCV: 95.2 fL (ref 80.0–100.0)
Monocytes Absolute: 1.3 10*3/uL — ABNORMAL HIGH (ref 0.1–1.0)
Monocytes Absolute: 1.6 10*3/uL — ABNORMAL HIGH (ref 0.1–1.0)
Monocytes Relative: 11 %
Monocytes Relative: 9 %
Neutro Abs: 12.2 10*3/uL — ABNORMAL HIGH (ref 1.7–7.7)
Neutro Abs: 12.4 10*3/uL — ABNORMAL HIGH (ref 1.7–7.7)
Neutrophils Relative %: 84 %
Neutrophils Relative %: 87 %
Platelets: 128 10*3/uL — ABNORMAL LOW (ref 150–400)
Platelets: 129 10*3/uL — ABNORMAL LOW (ref 150–400)
RBC: 2.9 MIL/uL — ABNORMAL LOW (ref 4.22–5.81)
RBC: 2.96 MIL/uL — ABNORMAL LOW (ref 4.22–5.81)
RDW: 17 % — ABNORMAL HIGH (ref 11.5–15.5)
RDW: 17.2 % — ABNORMAL HIGH (ref 11.5–15.5)
WBC: 14.1 10*3/uL — ABNORMAL HIGH (ref 4.0–10.5)
WBC: 14.8 10*3/uL — ABNORMAL HIGH (ref 4.0–10.5)
nRBC: 0.1 % (ref 0.0–0.2)
nRBC: 0.1 % (ref 0.0–0.2)

## 2022-09-27 LAB — BASIC METABOLIC PANEL
Anion gap: 10 (ref 5–15)
BUN: 24 mg/dL — ABNORMAL HIGH (ref 8–23)
CO2: 22 mmol/L (ref 22–32)
Calcium: 8.2 mg/dL — ABNORMAL LOW (ref 8.9–10.3)
Chloride: 97 mmol/L — ABNORMAL LOW (ref 98–111)
Creatinine, Ser: 1.41 mg/dL — ABNORMAL HIGH (ref 0.61–1.24)
GFR, Estimated: 53 mL/min — ABNORMAL LOW (ref >60)
Glucose, Bld: 245 mg/dL — ABNORMAL HIGH (ref 70–99)
Potassium: 5 mmol/L (ref 3.5–5.1)
Sodium: 129 mmol/L — ABNORMAL LOW (ref 135–145)

## 2022-09-27 LAB — COMPREHENSIVE METABOLIC PANEL
ALT: 17 U/L (ref 0–44)
AST: 39 U/L (ref 15–41)
Albumin: 3.5 g/dL (ref 3.5–5.0)
Alkaline Phosphatase: 58 U/L (ref 38–126)
Anion gap: 10 (ref 5–15)
BUN: 26 mg/dL — ABNORMAL HIGH (ref 8–23)
CO2: 24 mmol/L (ref 22–32)
Calcium: 8.4 mg/dL — ABNORMAL LOW (ref 8.9–10.3)
Chloride: 96 mmol/L — ABNORMAL LOW (ref 98–111)
Creatinine, Ser: 1.49 mg/dL — ABNORMAL HIGH (ref 0.61–1.24)
GFR, Estimated: 50 mL/min — ABNORMAL LOW (ref >60)
Glucose, Bld: 213 mg/dL — ABNORMAL HIGH (ref 70–99)
Potassium: 5 mmol/L (ref 3.5–5.1)
Sodium: 130 mmol/L — ABNORMAL LOW (ref 135–145)
Total Bilirubin: 0.5 mg/dL (ref 0.3–1.2)
Total Protein: 6.2 g/dL — ABNORMAL LOW (ref 6.5–8.1)

## 2022-09-27 LAB — POCT I-STAT 7, (LYTES, BLD GAS, ICA,H+H)
Acid-base deficit: 3 mmol/L — ABNORMAL HIGH (ref 0.0–2.0)
Bicarbonate: 23.1 mmol/L (ref 20.0–28.0)
Calcium, Ion: 1.21 mmol/L (ref 1.15–1.40)
HCT: 33 % — ABNORMAL LOW (ref 39.0–52.0)
Hemoglobin: 11.2 g/dL — ABNORMAL LOW (ref 13.0–17.0)
O2 Saturation: 94 %
Patient temperature: 36.8
Potassium: 5 mmol/L (ref 3.5–5.1)
Sodium: 130 mmol/L — ABNORMAL LOW (ref 135–145)
TCO2: 24 mmol/L (ref 22–32)
pCO2 arterial: 45.6 mmHg (ref 32–48)
pH, Arterial: 7.312 — ABNORMAL LOW (ref 7.35–7.45)
pO2, Arterial: 78 mmHg — ABNORMAL LOW (ref 83–108)

## 2022-09-27 LAB — LACTATE DEHYDROGENASE: LDH: 268 U/L — ABNORMAL HIGH (ref 98–192)

## 2022-09-27 LAB — GLUCOSE, CAPILLARY
Glucose-Capillary: 160 mg/dL — ABNORMAL HIGH (ref 70–99)
Glucose-Capillary: 172 mg/dL — ABNORMAL HIGH (ref 70–99)
Glucose-Capillary: 178 mg/dL — ABNORMAL HIGH (ref 70–99)
Glucose-Capillary: 191 mg/dL — ABNORMAL HIGH (ref 70–99)
Glucose-Capillary: 210 mg/dL — ABNORMAL HIGH (ref 70–99)
Glucose-Capillary: 215 mg/dL — ABNORMAL HIGH (ref 70–99)

## 2022-09-27 LAB — COOXEMETRY PANEL
Carboxyhemoglobin: 1.7 % — ABNORMAL HIGH (ref 0.5–1.5)
Methemoglobin: 1.2 % (ref 0.0–1.5)
O2 Saturation: 75.3 %
Total hemoglobin: 9.4 g/dL — ABNORMAL LOW (ref 12.0–16.0)

## 2022-09-27 LAB — PROTIME-INR
INR: 1.8 — ABNORMAL HIGH (ref 0.8–1.2)
Prothrombin Time: 20.3 seconds — ABNORMAL HIGH (ref 11.4–15.2)

## 2022-09-27 LAB — PHOSPHORUS: Phosphorus: 3.3 mg/dL (ref 2.5–4.6)

## 2022-09-27 LAB — MAGNESIUM: Magnesium: 2.3 mg/dL (ref 1.7–2.4)

## 2022-09-27 MED ORDER — WARFARIN SODIUM 5 MG PO TABS
5.0000 mg | ORAL_TABLET | Freq: Every day | ORAL | Status: DC
  Administered 2022-09-27: 5 mg via ORAL
  Filled 2022-09-27 (×2): qty 1

## 2022-09-27 MED ORDER — SODIUM CHLORIDE 0.9 % IV SOLN: INTRAVENOUS | Status: DC | PRN

## 2022-09-27 MED ORDER — SENNA 8.6 MG PO TABS
2.0000 | ORAL_TABLET | Freq: Every day | ORAL | Status: DC
  Administered 2022-09-27 – 2022-09-28 (×2): 17.2 mg via ORAL
  Filled 2022-09-27 (×2): qty 2

## 2022-09-27 MED ORDER — FUROSEMIDE 10 MG/ML IJ SOLN
4.0000 mg/h | INTRAVENOUS | Status: DC
  Administered 2022-09-27 – 2022-09-28 (×2): 8 mg/h via INTRAVENOUS
  Administered 2022-09-29: 4 mg/h via INTRAVENOUS
  Filled 2022-09-27 (×3): qty 20

## 2022-09-27 MED ORDER — FUROSEMIDE 10 MG/ML IJ SOLN
40.0000 mg | Freq: Once | INTRAMUSCULAR | Status: AC
  Administered 2022-09-27: 40 mg via INTRAVENOUS
  Filled 2022-09-27: qty 4

## 2022-09-27 MED ORDER — FUROSEMIDE 10 MG/ML IJ SOLN
60.0000 mg | Freq: Once | INTRAMUSCULAR | Status: AC
  Administered 2022-09-27: 60 mg via INTRAVENOUS
  Filled 2022-09-27: qty 6

## 2022-09-27 MED ORDER — CEFAZOLIN SODIUM-DEXTROSE 1-4 GM/50ML-% IV SOLN
1.0000 g | Freq: Three times a day (TID) | INTRAVENOUS | Status: AC
  Administered 2022-09-27 – 2022-09-28 (×3): 1 g via INTRAVENOUS
  Filled 2022-09-27 (×3): qty 50

## 2022-09-27 NOTE — Progress Notes (Addendum)
I was called in OR with a cardiac trauma  I came out and saw patient after that  RT attempting A line at beside.   Sounds like 1 hr of decreased mentation and low BP. This was right after the aline stopped working.   Vaso restarted and BP increased to MAP 80s.   UOP apparently 40cc / 4 hrs  CVP 18.   Reportedly flow stable at 3.8 entire time.   Giving 40 lasix now.   RT attempting aline  If they can't get aline, then will need hourly MAP checks with stethoscope.   MAP goal 75-85

## 2022-09-27 NOTE — Progress Notes (Signed)
NAME:  Ricardo Zuniga, MRN:  [id number], DOB:  1951/01/17, LOS: 5 ADMISSION DATE:  09/22/2022, CONSULTATION DATE:  09/24/2022 REFERRING MD:  Enter (CT surgery), CHIEF COMPLAINT: Vent management s/p LVAD implantation  History of Present Illness:  The patient is a 72 yo M with PMH of HFrEF (possibly d/t endocarditis), T2DM, APML s/p ATRA in 2014 who just underwent LVAD implantation on 2/14.  Was previously admitted in 05/2022 for orthopnea, PND, and LE edema and echo showed LVEF of 30-35%.  Heart cath showed CAD and patient was discharged on GDMT.  Repeat echo 1/24 showed EF 25%, severe LV dilation, global hypokinesis, moderate RV dysfunction, mild RV enlargement, and mild-to moderate MR; rapid deterioration from previous health.  LVAD implantation was urgently planned.   Procedure was successful and patient transferred to ICU 2/14 PM, sedated and intubated.  Did require Glide for difficult airway.  Received rifampin, vancomycin, and cefazolin for preoperative prophylaxis.  Transferred to ICU on pressors, NO, and milrinone, weaning off ventilator and NO.  Extubated morning 2/15 and working on recovery since.  Pertinent  Medical History   Diagnosis   APML (acute promyelocytic leukemia) in remission ([place])    Completed treatment 04/2013   HFrEF    EF 25% on 09/03/2022   Coronary artery disease    Nonobstructive at cardiac catheterization 09/2018   Gastroesophageal reflux disease   Hypercholesteremia   Neuropathy    bilateral feet   Nonischemic cardiomyopathy ([place])    a. EF 20% in 2015 - thought to possibly be viral induced or chemotherapy induced from treatments in 2014 b. at 35-40% by echo in 05/2016    Peptic ulcer disease   Type 2 diabetes mellitus ([place])   Vertigo    tx with valium prn   Significant Hospital Events: Including procedures, antibiotic start and stop dates in addition to other pertinent events   2/12 - Admitted for RHC and optimization 2/14 - LVAD implantation, transferred  to ICU 2/15 - Extubated, progressive ambulation and diet 2/16 - Nausea, still no BM, clear liquid diet as tolerated.  Interim History / Subjective:  Nausea better, but still not resolved. No appetite. Has been able to get small amounts of italian ice, jello down.  Objective   Blood pressure 96/79, pulse 100, temperature 97.8 F (36.6 C), temperature source Oral, resp. rate 15, height 5' 9"$  (1.753 m), weight 90.7 kg, SpO2 95 %. PAP: (29-67)/(13-30) 29/17 CVP:  [9 mmHg-23 mmHg] 13 mmHg CO:  [5.2 L/min-6.1 L/min] 5.2 L/min CI:  [2.6 L/min/m2-3.1 L/min/m2] 2.6 L/min/m2      Intake/Output Summary (Last 24 hours) at 09/27/2022 1714 Last data filed at 09/27/2022 1600 Gross per 24 hour  Intake 1269.69 ml  Output 2042 ml  Net -772.31 ml    Filed Weights   09/25/22 0500 09/26/22 0300 09/27/22 0400  Weight: 88 kg 88.8 kg 90.7 kg   Examination: General: Ill-appearing man sitting up in bed no acute distress HEENT: [place]/AT, eyes anicteric Lungs: Breathing comfortably on nasal cannula, reduced basilar breath sounds.  No rhonchi Cardiovascular: S1-S2, mechanical hum of LVAD.   Abdomen: Soft, nontender, hypoactive bowel sounds GU: Foley with yellow urine Extremities: Mild edema, no cyanosis. Skin: Warm, dry Neuro: Awake, alert, answering all questions frequently  Sodium 130 BUN 26 Creatinine 1.49 LDH 268 WBC 14.8 H/H 9.1/27.9 Platelets 128 Co. oximetry 75% CXR personally reviewed-basilar atelectasis, chest tubes and Swan in place  Assessment & Plan:  Acute on chronic HFrEF with cardiogenic shock s/p LVAD implantation  Postoperative expected vasoplegic shock -Continue Swan for hemodynamic monitoring - Continue milrinone and epi - Weaning vasopressin as able - Continue pain control per protocol - Chest tube and postop management per cardiothoracic surgery. - Postop antibiotics - Digoxin - Diuresis w/ lasix gtt -Appreciate management per advanced heart failure.  Resume GDMT as  tolerated. -Warfarin dosing per pharmacy  Nausea, poor appetite postop Constipation due to medications -Reglan - Zofran and Phenergan as needed - Trial of boost breeze to improve protein intake.  Advance diet as he can tolerate. - Increase bowel regimen-senna and MiraLAX today  T2DM, A1c 7.0  -Sliding scale insulin as needed - Anticipate that he will require some amount of long-acting insulin - Goal blood glucose less than 180 -Hopefully can resume Jardiance soon   HLD -Statin  Worsening leukocytosis - Continue antibiotics  Acute blood loss anemia, expected operative blood loss Consumptive thrombocytopenia - Monitor - Serial LDH monitoring to assess for hemolysis - No current indication for transfusion.  Hypervolemic hyponatremia - Diuresis  AKI, likely congestive - Diuresis - Strict I's/O - Renally dose meds and avoid nephrotoxic meds  Best Practice (right click and "Reselect all SmartList Selections" daily)   Diet/type: clear liquids, advance as tolerated DVT prophylaxis: prophylactic heparin  GI prophylaxis: H2B Lines: Central line, Arterial Line, and yes and it is still needed Foley:  Yes, and it is still needed Code Status:  full code Last date of multidisciplinary goals of care discussion: per primary  This patient is critically ill with multiple organ system failure which requires frequent high complexity decision making, assessment, support, evaluation, and titration of therapies. This was completed through the application of advanced monitoring technologies and extensive interpretation of multiple databases. During this encounter critical care time was devoted to patient care services described in this note for 36 minutes.  Julian Hy, DO 09/27/22 5:22 PM [facility]  For contact information, see Amion. If no response to pager, please call PCCM consult pager. After hours, 7PM- 7AM, please call Elink.

## 2022-09-27 NOTE — Progress Notes (Signed)
RT attempted to place arterial line x2 without success. MD and RN aware.

## 2022-09-27 NOTE — Progress Notes (Signed)
TCTS Progress Note: 3 Days Post-Op Procedure(s) (LRB): INSERTION OF IMPLANTABLE LEFT VENTRICULAR ASSIST DEVICE AND AORTIC VALVE CLOSURE (N/A) TRANSESOPHAGEAL ECHOCARDIOGRAM (N/A)  LOS: 5 days   See night overnight  Cvp 12 MAP 88  3.5L 5100 rpm  Plan today Up 100 speed Echo - looks great Rv good, IV septum midline, LV a little bigger than operative (good can increase speed) Lasix 60 now Lasix gtt 8 - want 100-250cc /hr uop and tomorrow AM to be net neg 3L . Was net neg 1L today. (P02 78 overnight - this needs to be better w diuresis as well) 33m coumadin tonight Likely DC Chest tubes tomorrow Anceph x 24 hrs - multiple a line attempts overnight, hx of line infections for pt.  Keep gtts same     Latest Ref Rng & Units 09/27/2022    5:46 AM 09/27/2022    4:15 AM 09/26/2022   11:52 PM  CBC  WBC 4.0 - 10.5 K/uL  14.8  14.1   Hemoglobin 13.0 - 17.0 g/dL 11.2  9.1  8.8   Hematocrit 39.0 - 52.0 % 33.0  27.9  27.6   Platelets 150 - 400 K/uL  128  129        Latest Ref Rng & Units 09/27/2022    5:46 AM 09/27/2022    4:15 AM 09/26/2022   11:52 PM  CMP  Glucose 70 - 99 mg/dL  213  245   BUN 8 - 23 mg/dL  26  24   Creatinine 0.61 - 1.24 mg/dL  1.49  1.41   Sodium 135 - 145 mmol/L 130  130  129   Potassium 3.5 - 5.1 mmol/L 5.0  5.0  5.0   Chloride 98 - 111 mmol/L  96  97   CO2 22 - 32 mmol/L  24  22   Calcium 8.9 - 10.3 mg/dL  8.4  8.2   Total Protein 6.5 - 8.1 g/dL  6.2    Total Bilirubin 0.3 - 1.2 mg/dL  0.5    Alkaline Phos 38 - 126 U/L  58    AST 15 - 41 U/L  39    ALT 0 - 44 U/L  17      ABG    Component Value Date/Time   PHART 7.312 (L) 09/27/2022 0546   PCO2ART 45.6 09/27/2022 0546   PO2ART 78 (L) 09/27/2022 0546   HCO3 23.1 09/27/2022 0546   TCO2 24 09/27/2022 0546   ACIDBASEDEF 3.0 (H) 09/27/2022 0546   O2SAT 94 09/27/2022 0546

## 2022-09-27 NOTE — Progress Notes (Signed)
Advanced Heart Failure VAD Team Note  PCP-Cardiologist: [name], MD   Subjective:   2/15 S/P HMIII LVAD . Given 1 PURBCs . Extubated.   Hypotensive overnight; vasopressin restarted.  -1L over the past 24H  Currently on Milrinone 0.25 mcg, Epi 4, vaso 0.02 (restarted overnight)  LVAD INTERROGATION:  HeartMate III LVAD:   Flow 3.5 liters/min, speed 5200, power 4, PI 4.4. Rare PI events.   Objective:    Vital Signs:   Temp:  [98.1 F (36.7 C)-98.8 F (37.1 C)] 98.2 F (36.8 C) (02/17 0600) Pulse Rate:  [83-108] 98 (02/17 0600) Resp:  [10-19] 18 (02/17 0600) BP: (94-103)/(61-89) 97/80 (02/17 0800) SpO2:  [78 %-100 %] 94 % (02/17 0600) Arterial Line BP: (62-114)/(57-110) 72/60 (02/16 2345) Weight:  [90.7 kg] 90.7 kg (02/17 0400) Last BM Date : 09/23/22 Mean arterial Pressure 90s  Intake/Output:   Intake/Output Summary (Last 24 hours) at 09/27/2022 1057 Last data filed at 09/27/2022 0600 Gross per 24 hour  Intake 1346.4 ml  Output 1637 ml  Net -290.6 ml     CVP 12 Physical Exam    Physical Exam: GENERAL: No acute distress. HEENT: normal  NECK: Supple, JVP  to midneck .  2+ bilaterally, no bruits.  No lymphadenopathy or thyromegaly appreciated.  RIJ swan CARDIAC:  Mechanical heart sounds with LVAD hum present.  LUNGS:  Clear to auscultation bilaterally.  ABDOMEN:  Soft, round, nontender, sluggish bowel sounds x4.     LVAD exit site:  Dressing dry and intact.  No erythema or drainage.    EXTREMITIES:  Warm and dry, no cyanosis, clubbing, rash. R and LLE 2+  or edema . L radial art line.  NEUROLOGIC:  Alert and oriented x 3.    No aphasia.  No dysarthria.  Affect pleasant.    GU: foley    Telemetry  SR 90s   EKG    N/A   Labs   Basic Metabolic Panel: Recent Labs  Lab 09/24/22 2127 09/24/22 2159 09/25/22 0258 09/25/22 0507 09/25/22 1600 09/26/22 0348 09/26/22 2352 09/27/22 0415 09/27/22 0546  NA  --    < > 137   < > 134*  137 133* 129* 130*  130*  K  --    < > 4.4   < > 4.5  4.6 4.7 5.0 5.0 5.0  CL  --   --  105  --  105 102 97* 96*  --   CO2  --   --  22  --  22 22 22 24  $ --   GLUCOSE  --   --  117*  --  136* 104* 245* 213*  --   BUN  --   --  17  --  16 16 24* 26*  --   CREATININE  --   --  1.08  --  1.04 0.92 1.41* 1.49*  --   CALCIUM  --   --  8.4*  --  8.2* 8.4* 8.2* 8.4*  --   MG 2.6*  --  2.4  --  2.4 2.3  --  2.3  --   PHOS  --   --  3.1  --   --  2.5  --  3.3  --    < > = values in this interval not displayed.     Liver Function Tests: Recent Labs  Lab 09/23/22 0416 09/25/22 0258 09/26/22 0348 09/27/22 0415  AST 19 54* 70* 39  ALT 24 20 20 17  $ ALKPHOS  66 43 49 58  BILITOT 1.0 1.9* 1.4* 0.5  PROT 6.4* 6.1* 6.4* 6.2*  ALBUMIN 3.3* 3.9 3.8 3.5    No results for input(s): "LIPASE", "AMYLASE" in the last 168 hours. No results for input(s): "AMMONIA" in the last 168 hours.  CBC: Recent Labs  Lab 09/25/22 0258 09/25/22 0507 09/25/22 1600 09/26/22 0348 09/26/22 2352 09/27/22 0415 09/27/22 0546  WBC 10.8*  --  14.5* 15.5* 14.1* 14.8*  --   NEUTROABS 8.9*  --   --  13.4* 12.2* 12.4*  --   HGB 9.7*   < > 9.8*  10.2* 9.6* 8.8* 9.1* 11.2*  HCT 29.5*   < > 28.5*  30.0* 28.6* 27.6* 27.9* 33.0*  MCV 91.6  --  91.9 92.3 95.2 94.3  --   PLT 137*  --  118* 126* 129* 128*  --    < > = values in this interval not displayed.     INR: Recent Labs  Lab 09/24/22 0324 09/24/22 1534 09/25/22 0258 09/26/22 0348 09/27/22 0415  INR 1.3* 1.7* 1.6* 1.7* 1.8*     Other results: EKG:    Imaging   DG Chest Port 1 View  Result Date: 09/27/2022 CLINICAL DATA:  Heart failure following cardiac surgery. EXAM: PORTABLE CHEST 1 VIEW COMPARISON:  09/26/2022 FINDINGS: The Swan-Ganz catheter is stable. The mediastinal drain tubes are stable. Left ventricular assist device without complicating features. Stable cardiac enlargement, central vascular congestion, mild interstitial edema and small pleural effusions.  IMPRESSION: 1. Stable support apparatus. 2. Cardiac enlargement, central vascular congestion, mild interstitial edema and small pleural effusions. Electronically Signed   By: Marijo Sanes M.D.   On: 09/27/2022 08:35   DG CHEST PORT 1 VIEW  Result Date: 09/26/2022 CLINICAL DATA:  Pulmonary edema with congestive heart failure EXAM: PORTABLE CHEST 1 VIEW COMPARISON:  Yesterday FINDINGS: LVAD in place. The trachea and esophagus have been extubated. Swan-Ganz catheter with tip at the main pulmonary artery level. Postoperative pneumomediastinum. Chest tubes remain. Trace pleural fluid is possible. Mild vascular congestion. No visible pneumothorax. IMPRESSION: 1. Unchanged remaining hardware. 2. Atelectasis and vascular congestion. 3. No pneumothorax. Electronically Signed   By: Jorje Guild M.D.   On: 09/26/2022 06:41     Medications:     Scheduled Medications:  sodium chloride   Intravenous Once   sodium chloride   Intravenous Once   acetaminophen  1,000 mg Oral Q6H   amiodarone  200 mg Oral BID   aspirin EC  325 mg Oral Daily   Or   aspirin  324 mg Per Tube Daily   Or   aspirin  300 mg Rectal Daily   atorvastatin  40 mg Oral Daily   bisacodyl  10 mg Oral Daily   Or   bisacodyl  10 mg Rectal Daily   Chlorhexidine Gluconate Cloth  6 each Topical Daily   digoxin  0.0625 mg Oral Daily   docusate sodium  200 mg Oral BID   feeding supplement  1 Container Oral TID BM   furosemide  60 mg Intravenous Once   heparin injection (subcutaneous)  5,000 Units Subcutaneous Q8H   insulin aspart  0-24 Units Subcutaneous Q4H   metoCLOPramide (REGLAN) injection  10 mg Intravenous Q8H   pantoprazole  40 mg Oral Daily   polyethylene glycol  17 g Oral Daily   sodium chloride flush  10-40 mL Intracatheter Q12H   sodium chloride flush  3 mL Intravenous Q12H   warfarin  5 mg Oral Daily  Warfarin - Physician Dosing Inpatient   Does not apply q1600    Infusions:  sodium chloride 10 mL/hr at 09/27/22  0600   sodium chloride     sodium chloride 10 mL/hr at 09/27/22 0600   sodium chloride      ceFAZolin (ANCEF) IV     epinephrine 4 mcg/min (09/27/22 0600)   furosemide (LASIX) 200 mg in dextrose 5 % 100 mL (2 mg/mL) infusion     insulin Stopped (09/26/22 1455)   lactated ringers     lactated ringers     lactated ringers 20 mL/hr at 09/27/22 0600   milrinone 0.25 mcg/kg/min (09/27/22 0600)   promethazine (PHENERGAN) injection (IM or IVPB) 12.5 mg (09/27/22 0658)   vasopressin 0.03 Units/min (09/27/22 0600)    PRN Medications: sodium chloride, Place/Maintain arterial line **AND** sodium chloride, acetaminophen, albuterol, dextrose, lactated ringers, morphine injection, ondansetron (ZOFRAN) IV, mouth rinse, mouth rinse, mouth rinse, oxyCODONE, promethazine (PHENERGAN) injection (IM or IVPB), sodium chloride flush, sodium chloride flush, traMADol, traZODone   Patient Profile  72 y/o male w/ long standing h/o chronic systolic heart failure due to nonischemic cardiomyopathy, T2DM and hx of acute promyelocytic leukemia s/p ATRA ([facility] 2014). Recent development of rapidly progressive heart failure,  NYHA class IIIb-IV symptoms. Admitted for for RHC and optimization prior to VAD implant.   RHC w/ elevated filling pressures and low output (RA 13, PAP 64/26 (40), PCWP 23, CI 2 L/min/m2) =>started on Milrinone  2/14 S/P HMIII VAD   Assessment/Plan:   1. Chronic HFrEF, NICM -> 09/24/22 S/P HMIII LVAD  - POD #3 - Overnight, arterial line removed; hypotensive briefly with vasopressin added back on.  - This morning after net negative 1L q24h hemodynamics: PA 46/19, CVP 12, PCW 17; CXR w/ pulmonary edema. Bedside TTE w/ mildly reduced RV function (improved from prior), IVS midline.  - Increase speed by 100.  - IV lasix 10m x 1 followed by lasix gtt at 828mhr to target net negative 2-3L over the next 24h.  - INR 1.8, LDH 268 from 338.  2.Acute blood loss anemia - Hgb stable at 9.1 Follow daily  CBC.  3.  NSVT/PVCS Rare PVCs.  4.  Hx of promyelocytic leukemia: [facility] . Hx of endocarditis: 2015  I reviewed the LVAD parameters from today, and compared the results to the patient's prior recorded data.  No programming changes were made.  The LVAD is functioning within specified parameters.  The patient performs LVAD self-test daily.  LVAD interrogation was negative for any significant power changes, alarms or PI events/speed drops.  LVAD equipment check completed and is in good working order.  Back-up equipment present.   LVAD education done on emergency procedures and precautions and reviewed exit site care.  Length of Stay: 5 GlascoDO 09/27/2022, 10:57 AM  VAD Team --- VAD ISSUES ONLY--- Pager [phone] - 7am)  Advanced Heart Failure Team  Pager [phone]-F; 7a - 5p)  Please contact [facility] for night-coverage after hours (5p -7a ) and weekends on [email or web address]  CRITICAL CARE Performed by: AdHebert Soho Total critical care time: 40 minutes  Critical care time was exclusive of separately billable procedures and treating other patients.  Critical care was necessary to treat or prevent imminent or life-threatening deterioration.  Critical care was time spent personally by me on the following activities: development of treatment plan with patient and/or surrogate as well as nursing, discussions with consultants, evaluation  of patient's response to treatment, examination of patient, obtaining history from patient or surrogate, ordering and performing treatments and interventions, ordering and review of laboratory studies, ordering and review of radiographic studies, pulse oximetry and re-evaluation of patient's condition.

## 2022-09-27 NOTE — Progress Notes (Signed)
Drive Line:  Existing VAD dressing removed and site care performed using sterile technique. Drive line exit site cleaned with Chlora prep applicators x 2, allowed to dry, and gauze dressing with Silver strip applied. Exit site with 3 sutures and unincorporated, the velour is fully implanted at exit site. No drainage. No redness, tenderness, foul odor or rash noted. Drive line anchor re-applied. Secure. Next dressing change due 09/29/22 by nurse champion.

## 2022-09-28 ENCOUNTER — Inpatient Hospital Stay (HOSPITAL_COMMUNITY): Payer: Medicare PPO

## 2022-09-28 DIAGNOSIS — R110 Nausea: Secondary | ICD-10-CM | POA: Diagnosis not present

## 2022-09-28 DIAGNOSIS — R570 Cardiogenic shock: Secondary | ICD-10-CM | POA: Diagnosis not present

## 2022-09-28 DIAGNOSIS — I5023 Acute on chronic systolic (congestive) heart failure: Secondary | ICD-10-CM | POA: Diagnosis not present

## 2022-09-28 DIAGNOSIS — J9601 Acute respiratory failure with hypoxia: Secondary | ICD-10-CM

## 2022-09-28 LAB — CBC WITH DIFFERENTIAL/PLATELET
Abs Immature Granulocytes: 0.03 10*3/uL (ref 0.00–0.07)
Basophils Absolute: 0 10*3/uL (ref 0.0–0.1)
Basophils Relative: 0 %
Eosinophils Absolute: 0 10*3/uL (ref 0.0–0.5)
Eosinophils Relative: 0 %
HCT: 26.2 % — ABNORMAL LOW (ref 39.0–52.0)
Hemoglobin: 8.8 g/dL — ABNORMAL LOW (ref 13.0–17.0)
Immature Granulocytes: 0 %
Lymphocytes Relative: 8 %
Lymphs Abs: 0.7 10*3/uL (ref 0.7–4.0)
MCH: 31.1 pg (ref 26.0–34.0)
MCHC: 33.6 g/dL (ref 30.0–36.0)
MCV: 92.6 fL (ref 80.0–100.0)
Monocytes Absolute: 0.8 10*3/uL (ref 0.1–1.0)
Monocytes Relative: 9 %
Neutro Abs: 7.3 10*3/uL (ref 1.7–7.7)
Neutrophils Relative %: 83 %
Platelets: 137 10*3/uL — ABNORMAL LOW (ref 150–400)
RBC: 2.83 MIL/uL — ABNORMAL LOW (ref 4.22–5.81)
RDW: 16.1 % — ABNORMAL HIGH (ref 11.5–15.5)
WBC: 8.8 10*3/uL (ref 4.0–10.5)
nRBC: 0 % (ref 0.0–0.2)

## 2022-09-28 LAB — PROTIME-INR
INR: 5.5 (ref 0.8–1.2)
INR: 6.7 (ref 0.8–1.2)
Prothrombin Time: 49.3 seconds — ABNORMAL HIGH (ref 11.4–15.2)
Prothrombin Time: 58.2 seconds — ABNORMAL HIGH (ref 11.4–15.2)

## 2022-09-28 LAB — GLUCOSE, CAPILLARY
Glucose-Capillary: 155 mg/dL — ABNORMAL HIGH (ref 70–99)
Glucose-Capillary: 131 mg/dL — ABNORMAL HIGH (ref 70–99)
Glucose-Capillary: 132 mg/dL — ABNORMAL HIGH (ref 70–99)
Glucose-Capillary: 127 mg/dL — ABNORMAL HIGH (ref 70–99)
Glucose-Capillary: 116 mg/dL — ABNORMAL HIGH (ref 70–99)

## 2022-09-28 LAB — BASIC METABOLIC PANEL
Anion gap: 11 (ref 5–15)
BUN: 27 mg/dL — ABNORMAL HIGH (ref 8–23)
CO2: 24 mmol/L (ref 22–32)
Calcium: 8.1 mg/dL — ABNORMAL LOW (ref 8.9–10.3)
Chloride: 97 mmol/L — ABNORMAL LOW (ref 98–111)
Creatinine, Ser: 1.27 mg/dL — ABNORMAL HIGH (ref 0.61–1.24)
GFR, Estimated: >60 mL/min (ref >60)
Glucose, Bld: 141 mg/dL — ABNORMAL HIGH (ref 70–99)
Potassium: 3.6 mmol/L (ref 3.5–5.1)
Sodium: 132 mmol/L — ABNORMAL LOW (ref 135–145)

## 2022-09-28 LAB — HEPATIC FUNCTION PANEL
ALT: 12 U/L (ref 0–44)
AST: 28 U/L (ref 15–41)
Albumin: 3.1 g/dL — ABNORMAL LOW (ref 3.5–5.0)
Alkaline Phosphatase: 57 U/L (ref 38–126)
Bilirubin, Direct: 0.2 mg/dL (ref 0.0–0.2)
Indirect Bilirubin: 0.4 mg/dL (ref 0.3–0.9)
Total Bilirubin: 0.6 mg/dL (ref 0.3–1.2)
Total Protein: 6.1 g/dL — ABNORMAL LOW (ref 6.5–8.1)

## 2022-09-28 LAB — COOXEMETRY PANEL
Carboxyhemoglobin: 2 % — ABNORMAL HIGH (ref 0.5–1.5)
Methemoglobin: <0.7 % (ref 0.0–1.5)
O2 Saturation: 68.4 %
Total hemoglobin: 8.7 g/dL — ABNORMAL LOW (ref 12.0–16.0)

## 2022-09-28 LAB — LACTATE DEHYDROGENASE: LDH: 249 U/L — ABNORMAL HIGH (ref 98–192)

## 2022-09-28 LAB — PHOSPHORUS: Phosphorus: 2.1 mg/dL — ABNORMAL LOW (ref 2.5–4.6)

## 2022-09-28 LAB — MAGNESIUM: Magnesium: 2 mg/dL (ref 1.7–2.4)

## 2022-09-28 MED ORDER — EMPAGLIFLOZIN 10 MG PO TABS
10.0000 mg | ORAL_TABLET | Freq: Every day | ORAL | Status: DC
  Administered 2022-09-28 – 2022-10-07 (×10): 10 mg via ORAL
  Filled 2022-09-28 (×10): qty 1

## 2022-09-28 MED ORDER — SODIUM CHLORIDE 0.9% IV SOLUTION: Freq: Once | INTRAVENOUS | Status: AC

## 2022-09-28 MED ORDER — GLUCERNA SHAKE PO LIQD
237.0000 mL | Freq: Three times a day (TID) | ORAL | Status: DC
  Administered 2022-09-28 – 2022-09-29 (×2): 237 mL via ORAL

## 2022-09-28 MED ORDER — ASPIRIN 81 MG PO CHEW: 81.0000 mg | CHEWABLE_TABLET | Freq: Every day | ORAL | Status: DC

## 2022-09-28 MED ORDER — SENNA 8.6 MG PO TABS
1.0000 | ORAL_TABLET | Freq: Once | ORAL | Status: AC
  Administered 2022-09-28: 8.6 mg via ORAL
  Filled 2022-09-28: qty 1

## 2022-09-28 MED ORDER — POTASSIUM CHLORIDE CRYS ER 20 MEQ PO TBCR
20.0000 meq | EXTENDED_RELEASE_TABLET | ORAL | Status: AC
  Administered 2022-09-28 (×3): 20 meq via ORAL
  Filled 2022-09-28 (×3): qty 1

## 2022-09-28 NOTE — Progress Notes (Signed)
LVAD Coordinator Rounding Note:  Admitted 09/22/22 to Dr. Conception Chancy service for Ricardo Zuniga and optimization prior to VAD implant.   HeartMate III LVAD implanted on 09/25/22 by Dr.Enter under DT criteria.   Patient awake and alert this morning. States his nausea and pain are under much better control.  Patient VAD flows at 4.0L this morning. Speed increase to 5300 per [name].  CVP 6 this morning. UOP 1725 overnight. Lasix gtt decreased to 4 mg/hr. CT output 230 overnight serosanguinous fluid.  Plan to discontinue Swan today and leave chest tubes till INR below 2.5 per MD.   Vital signs: Temp: 97.2  HR:  91 Automatic BP: 92/75 (82) Doppler MAP:80 O2 Sat: 94 2L [place] Wt: 194>195.7>199.9>193.2lbs  LVAD interrogation reveals:  Speed:  5300 Flow: 4.0 Power:3.8w PI: 4.0 Alarms: none  Events: none Fixed speed:  5300 Low speed limit: 5000 HCT: 29  Drive Line:  Existing VAD dressing CDI. Dressing changed overnight with no reported drainage. Continue every other day dressing changes. Next dressing change due 09/29/22 by Lorriane Zuniga (caregiver) with VAD Coordinator supervision.   Labs:  Hgb:  9.7>9.6  WBC:  10.8>15.5  LDH trend:  284>338  INR trend:  1.6>1.7>1.8>5.5  Co-ox: 74.2>68.7>75.3>68.4  Creatinine: 1.4>0.92>1.41>1.49>1.27  LDH: 284>338>268>249  Intra-op blood products: 4 FFP Cell Saver 650 mL  ICU Blood Products: 09/25/22 1 PRBC  Arrhythmias:   Gtts: Epinephrine: 47mg/min Vasopressin: 0.04 units/min stopped 09/27/22 Milrinone: 0.232m/kg/min  Ventilator Status:  Extubated 09/25/22  Nitric oxide:  Weaned off 09/25/22  Anticoagulation Plan: INR Goal: 2-2.5 ASA dose:32429m Aspirin 5m48mdered to start 2/19 Coumadin on hold tonight due to INR of 5.5  Adverse Events on VAD:   Patient Education: Patient's wife Ricardo Shireerved dressing change. Will plan to meet this afternoon with Ricardo Zuniga her to perform dressing change with assistance from VAD TillsonPlan/Recommendations:  1. Page VAD coordinator for any issues with VAD equipment or driveline. 2. Every other day dressing changes by VAD coordinator or nurse champion.   Ricardo Zuniga BSN VAD Coordinator 24/7 Pager [phone]

## 2022-09-28 NOTE — Progress Notes (Signed)
TCTS Progress Note: 4 Days Post-Op Procedure(s) (LRB): INSERTION OF IMPLANTABLE LEFT VENTRICULAR ASSIST DEVICE AND AORTIC VALVE CLOSURE (N/A) TRANSESOPHAGEAL ECHOCARDIOGRAM (N/A)  LOS: 6 days   No sig events overnight  Abd soft ntnd Extr 1+ LE edema  Flows good this AM at 3.9L at 5200 RPM The RPM was turned up to 5300 RPM by HF, I agreed.  Epi 2 milrinone 0.25.       Latest Ref Rng & Units 09/28/2022    5:05 AM 09/27/2022    5:46 AM 09/27/2022    4:15 AM  CBC  WBC 4.0 - 10.5 K/uL 8.8   14.8   Hemoglobin 13.0 - 17.0 g/dL 8.8  11.2  9.1   Hematocrit 39.0 - 52.0 % 26.2  33.0  27.9   Platelets 150 - 400 K/uL 137   128        Latest Ref Rng & Units 09/28/2022   11:29 AM 09/28/2022    5:05 AM 09/27/2022    5:46 AM  CMP  Glucose 70 - 99 mg/dL  141    BUN 8 - 23 mg/dL  27    Creatinine 0.61 - 1.24 mg/dL  1.27    Sodium 135 - 145 mmol/L  132  130   Potassium 3.5 - 5.1 mmol/L  3.6  5.0   Chloride 98 - 111 mmol/L  97    CO2 22 - 32 mmol/L  24    Calcium 8.9 - 10.3 mg/dL  8.1    Total Protein 6.5 - 8.1 g/dL 6.1     Total Bilirubin 0.3 - 1.2 mg/dL 0.6     Alkaline Phos 38 - 126 U/L 57     AST 15 - 41 U/L 28     ALT 0 - 44 U/L 12       ABG    Component Value Date/Time   PHART 7.312 (L) 09/27/2022 0546   PCO2ART 45.6 09/27/2022 0546   PO2ART 78 (L) 09/27/2022 0546   HCO3 23.1 09/27/2022 0546   TCO2 24 09/27/2022 0546   ACIDBASEDEF 3.0 (H) 09/27/2022 0546   O2SAT 68.4 09/28/2022 0505     POD 4 LVAD/ AV closure  Labs Wc is a lot better at 8.8 from 14 Hgb is stable at 8.8 from 9.1 Platelets are 137 from 128  PLAN today Hold coumadin as INR 5 DC swan, keep introducer Lasix to 4 gtt, goal 0 to 1L negative by tomorrow Increased speed to 5300, which is a 100 increase Boost breeze added as it is nutrional and tastes adequate  N: no issues, Is mostly gettign tramadol for pain CV: speed changes as above PAS 30/20s before swan DCD, CVP by nursing was 17, by HF it was 7.   Keeping epi at 2 and milrinone at 0.25, speed change alone is enough for today.  Resp: 2L 02, looks much more comfortable than yesterday in terms of breathing when then his p02 was 78.  XR today looks wet. It looks better than yesterday. It looks overall like it did 2 days ago.  GI: on clears only, pretty nausea, good that WC is improved, makes less likely to be unusual etiology of nausea Got boost breeze 1 time yesterday, is getting more of it.  May need calorie count if not suffiecient.  GU:   Intake/Output Summary (Last 24 hours) at 09/28/2022 1236 Last data filed at 09/28/2022 1000 Gross per 24 hour  Intake 1679.81 ml  Output 4030 ml  Net -2350.19 ml  Neg 2L so we are doing the plan written above  Heme hgb stable inr is 5.5 from 1.8. This may actually be real as the patient has had low po intake due to nausea.  A redraw today has been ordered. If the INR is greater than 3 - I would not administer any coumadin today.  Plts stable ID: now s/p 24 hrs anceph after a lot of access for arterial lines, did not get an arterial line back. Not on abx now.  WC 8.8  Endo - critical care is starting jiardiance so we don't overshoot insulin with short acting.  T/L/DLuiz Blare is out keeping introducer in  No pacing wires as pt is VAD Chest tubes staying in as INR is high.

## 2022-09-28 NOTE — Progress Notes (Signed)
NAME:  Ricardo Zuniga, MRN:  [id number], DOB:  1951/05/02, LOS: 6 ADMISSION DATE:  09/22/2022, CONSULTATION DATE:  09/24/2022 REFERRING MD:  Enter (CT surgery), CHIEF COMPLAINT: Vent management s/p LVAD implantation  History of Present Illness:  The patient is a 72 yo M with PMH of HFrEF (possibly d/t endocarditis), T2DM, APML s/p ATRA in 2014 who just underwent LVAD implantation on 2/14.  Was previously admitted in 05/2022 for orthopnea, PND, and LE edema and echo showed LVEF of 30-35%.  Heart cath showed CAD and patient was discharged on GDMT.  Repeat echo 1/24 showed EF 25%, severe LV dilation, global hypokinesis, moderate RV dysfunction, mild RV enlargement, and mild-to moderate MR; rapid deterioration from previous health.  LVAD implantation was urgently planned.   Procedure was successful and patient transferred to ICU 2/14 PM, sedated and intubated.  Did require Glide for difficult airway.  Received rifampin, vancomycin, and cefazolin for preoperative prophylaxis.  Transferred to ICU on pressors, NO, and milrinone, weaning off ventilator and NO.  Extubated morning 2/15 and working on recovery since.  Pertinent  Medical History   Diagnosis   APML (acute promyelocytic leukemia) in remission ([place])    Completed treatment 04/2013   HFrEF    EF 25% on 09/03/2022   Coronary artery disease    Nonobstructive at cardiac catheterization 09/2018   Gastroesophageal reflux disease   Hypercholesteremia   Neuropathy    bilateral feet   Nonischemic cardiomyopathy ([place])    a. EF 20% in 2015 - thought to possibly be viral induced or chemotherapy induced from treatments in 2014 b. at 35-40% by echo in 05/2016    Peptic ulcer disease   Type 2 diabetes mellitus ([place])   Vertigo    tx with valium prn   Significant Hospital Events: Including procedures, antibiotic start and stop dates in addition to other pertinent events   2/12 - Admitted for RHC and optimization 2/14 - LVAD implantation, transferred  to ICU 2/15 - Extubated, progressive ambulation and diet 2/16 - Nausea, still no BM, clear liquid diet as tolerated.  Interim History / Subjective:  Nausea improved, overall feeling improved today. Appetite slowly increasing.   Objective   Blood pressure 99/78, pulse (!) 117, temperature 97.8 F (36.6 C), temperature source Oral, resp. rate (!) 26, height 5' 9"$  (1.753 m), weight 87.6 kg, SpO2 97 %. PAP: (24-41)/(11-29) 37/19 CVP:  [0 mmHg-21 mmHg] 21 mmHg      Intake/Output Summary (Last 24 hours) at 09/28/2022 1256 Last data filed at 09/28/2022 1000 Gross per 24 hour  Intake 1679.81 ml  Output 4030 ml  Net -2350.19 ml    Filed Weights   09/26/22 0300 09/27/22 0400 09/28/22 0500  Weight: 88.8 kg 90.7 kg 87.6 kg   Examination: General: elderly man sitting up in bed in NAD HEENT: [place]/AT, eyes anicteric Lungs: breathing comfortably on 2L [place], reduced basilar breath sounds Cardiovascular: S1S2, mechanical VAD sounds Abdomen:  soft, NT GU: foley with light yellow urine Extremities: persistent mild edema Skin: warm, dry, no rashes. Neuro: awake, alert, more interactive today. Answering questions appropriately, moving all extremities.   Sodium 132 BUN 27 Creatinine 1.27 LDH 249 WBC 8.8 H/H 8.8/36.2 Platelets 137 Co. oximetry 68%   CXR personally reviewed> bibasilar atelectasis, LVAD, Swan via RIJ. No concerning infiltrates.  Assessment & Plan:  Acute on chronic HFrEF with cardiogenic shock s/p LVAD implantation Postoperative expected vasoplegic shock -swan removed today -serial cooximetry -con't milrinone 0.72mg, reduce dose epi 2 259m - pain  control per protocol - keep chest tubes today due to elevated INR - digoxin -start farxiga today, d/c foley -con't diuresis, lasix 62m /h -hold warfarin today, recheck INR -progress mobility as able  Supratherapeutic INR -hold warfarin -recheck  Nausea, poor appetite postop Constipation due to medications --con't  reglan - zofran and phenergan PRN -address constipation, escalate bowel regimen -glucerna  T2DM, A1c 7.0  -SSI PRN -add jardiance -goal BG <180   HLD -statin  Worsening leukocytosis; resolved -monitor  Acute blood loss anemia, expected operative blood loss Consumptive thrombocytopenia - con't to monitor -serial LDH monitoring; no evidence of significant hemolysis currently. - no current indication for transfusion  Hypervolemic hyponatremia - lasix gtt  AKI, likely congestive; improving with diuresis - con't diuresis - strict I/O -renally dose meds, avoid nephrotoxic meds  Best Practice (right click and "Reselect all SmartList Selections" daily)   Diet/type: clear liquids, advance as tolerated DVT prophylaxis: Coumadin GI prophylaxis: N/A Lines: Central line, Arterial Line, and yes and it is still needed Foley:  removal ordered  Code Status:  full code Last date of multidisciplinary goals of care discussion: per primary  This patient is critically ill with multiple organ system failure which requires frequent high complexity decision making, assessment, support, evaluation, and titration of therapies. This was completed through the application of advanced monitoring technologies and extensive interpretation of multiple databases. During this encounter critical care time was devoted to patient care services described in this note for 37 minutes.  LJulian Hy DO 09/28/22 1:59 PM [facility]  For contact information, see Amion. If no response to pager, please call PCCM consult pager. After hours, 7PM- 7AM, please call Elink.

## 2022-09-28 NOTE — Progress Notes (Signed)
Advanced Heart Failure VAD Team Note  PCP-Cardiologist: [name], MD   Subjective:   2/15 S/P HMIII LVAD . Given 1 PURBCs . Extubated.   -No significant events overnight.  -4.2L over the past 24H, negative 2.2L.  Currently on Milrinone 0.25 mcg, Epi 2, lasix 71m/hr   LVAD INTERROGATION:  HeartMate III LVAD:   Flow 3.9-4 liters/min, speed 5300, power 4, PI 3.5,  Rare PI events.   Objective:    Vital Signs:   Temp:  [97 F (36.1 C)-98.8 F (37.1 C)] 97.7 F (36.5 C) (02/18 1030) Pulse Rate:  [88-192] 93 (02/18 1030) Resp:  [12-30] 19 (02/18 1030) BP: (81-111)/(50-95) 87/68 (02/18 1030) SpO2:  [80 %-100 %] 93 % (02/18 1030) Weight:  [87.6 kg] 87.6 kg (02/18 0500) Last BM Date : 09/23/22 Mean arterial Pressure 90s  Intake/Output:   Intake/Output Summary (Last 24 hours) at 09/28/2022 1051 Last data filed at 09/28/2022 1000 Gross per 24 hour  Intake 1788.27 ml  Output 4615 ml  Net -2826.73 ml     CVP 6 Physical Exam    Physical Exam: GENERAL: No acute distress. HEENT: normal  NECK: Supple, right IJ PA catheter in place.  lymphadenopathy or thyromegaly appreciated.  RIJ swan in place CARDIAC:  Mechanical heart sounds with LVAD hum present.  LUNGS:  Clear to auscultation bilaterally.  ABDOMEN:  Soft, round, nontender, sluggish bowel sounds x4.     LVAD exit site:  Dressing dry and intact.  No erythema or drainage.    EXTREMITIES:  Warm and dry, no cyanosis, clubbing, rash. R and LLE 2+  or edema . L radial art line.  NEUROLOGIC:  Alert and oriented x 3.    No aphasia.  No dysarthria.  Affect pleasant.    GU: foley    Telemetry  SR 90s   EKG    N/A   Labs   Basic Metabolic Panel: Recent Labs  Lab 09/25/22 0258 09/25/22 0507 09/25/22 1600 09/26/22 0348 09/26/22 2352 09/27/22 0415 09/27/22 0546 09/28/22 0505  NA 137   < > 134*  137 133* 129* 130* 130* 132*  K 4.4   < > 4.5  4.6 4.7 5.0 5.0 5.0 3.6  CL 105  --  105 102 97* 96*  --  97*  CO2  22  --  22 22 22 24  $ --  24  GLUCOSE 117*  --  136* 104* 245* 213*  --  141*  BUN 17  --  16 16 24* 26*  --  27*  CREATININE 1.08  --  1.04 0.92 1.41* 1.49*  --  1.27*  CALCIUM 8.4*  --  8.2* 8.4* 8.2* 8.4*  --  8.1*  MG 2.4  --  2.4 2.3  --  2.3  --  2.0  PHOS 3.1  --   --  2.5  --  3.3  --  2.1*   < > = values in this interval not displayed.     Liver Function Tests: Recent Labs  Lab 09/23/22 0416 09/25/22 0258 09/26/22 0348 09/27/22 0415  AST 19 54* 70* 39  ALT 24 20 20 17  $ ALKPHOS 66 43 49 58  BILITOT 1.0 1.9* 1.4* 0.5  PROT 6.4* 6.1* 6.4* 6.2*  ALBUMIN 3.3* 3.9 3.8 3.5    No results for input(s): "LIPASE", "AMYLASE" in the last 168 hours. No results for input(s): "AMMONIA" in the last 168 hours.  CBC: Recent Labs  Lab 09/25/22 0258 09/25/22 0507 09/25/22 1600 09/26/22 0348  09/26/22 2352 09/27/22 0415 09/27/22 0546 09/28/22 0505  WBC 10.8*  --  14.5* 15.5* 14.1* 14.8*  --  8.8  NEUTROABS 8.9*  --   --  13.4* 12.2* 12.4*  --  7.3  HGB 9.7*   < > 9.8*  10.2* 9.6* 8.8* 9.1* 11.2* 8.8*  HCT 29.5*   < > 28.5*  30.0* 28.6* 27.6* 27.9* 33.0* 26.2*  MCV 91.6  --  91.9 92.3 95.2 94.3  --  92.6  PLT 137*  --  118* 126* 129* 128*  --  137*   < > = values in this interval not displayed.     INR: Recent Labs  Lab 09/24/22 1534 09/25/22 0258 09/26/22 0348 09/27/22 0415 09/28/22 0505  INR 1.7* 1.6* 1.7* 1.8* 5.5*     Other results: EKG:    Imaging   DG Chest Port 1 View  Result Date: 09/27/2022 CLINICAL DATA:  Heart failure following cardiac surgery. EXAM: PORTABLE CHEST 1 VIEW COMPARISON:  09/26/2022 FINDINGS: The Swan-Ganz catheter is stable. The mediastinal drain tubes are stable. Left ventricular assist device without complicating features. Stable cardiac enlargement, central vascular congestion, mild interstitial edema and small pleural effusions. IMPRESSION: 1. Stable support apparatus. 2. Cardiac enlargement, central vascular congestion, mild  interstitial edema and small pleural effusions. Electronically Signed   By: Marijo Sanes M.D.   On: 09/27/2022 08:35     Medications:     Scheduled Medications:  sodium chloride   Intravenous Once   sodium chloride   Intravenous Once   acetaminophen  1,000 mg Oral Q6H   amiodarone  200 mg Oral BID   aspirin EC  325 mg Oral Daily   Or   aspirin  324 mg Per Tube Daily   Or   aspirin  300 mg Rectal Daily   atorvastatin  40 mg Oral Daily   bisacodyl  10 mg Oral Daily   Or   bisacodyl  10 mg Rectal Daily   Chlorhexidine Gluconate Cloth  6 each Topical Daily   digoxin  0.0625 mg Oral Daily   docusate sodium  200 mg Oral BID   feeding supplement  1 Container Oral TID BM   heparin injection (subcutaneous)  5,000 Units Subcutaneous Q8H   insulin aspart  0-24 Units Subcutaneous Q4H   metoCLOPramide (REGLAN) injection  10 mg Intravenous Q8H   pantoprazole  40 mg Oral Daily   polyethylene glycol  17 g Oral Daily   potassium chloride  20 mEq Oral Q4H   senna  2 tablet Oral Daily   sodium chloride flush  10-40 mL Intracatheter Q12H   sodium chloride flush  3 mL Intravenous Q12H   warfarin  5 mg Oral Daily   Warfarin - Physician Dosing Inpatient   Does not apply q1600    Infusions:  sodium chloride Stopped (09/28/22 0623)   sodium chloride     sodium chloride 10 mL/hr at 09/28/22 1000   sodium chloride     epinephrine 2 mcg/min (09/28/22 1000)   furosemide (LASIX) 200 mg in dextrose 5 % 100 mL (2 mg/mL) infusion 4 mg/hr (09/28/22 1041)   insulin Stopped (09/26/22 1455)   lactated ringers     lactated ringers     lactated ringers 20 mL/hr at 09/28/22 1000   milrinone 0.25 mcg/kg/min (09/28/22 1000)   promethazine (PHENERGAN) injection (IM or IVPB) Stopped (09/28/22 0007)   vasopressin Stopped (09/27/22 1207)    PRN Medications: sodium chloride, Place/Maintain arterial line **AND** sodium chloride, acetaminophen,  albuterol, dextrose, lactated ringers, morphine injection,  ondansetron (ZOFRAN) IV, mouth rinse, mouth rinse, mouth rinse, oxyCODONE, promethazine (PHENERGAN) injection (IM or IVPB), sodium chloride flush, sodium chloride flush, traMADol, traZODone   Patient Profile  72 y/o male w/ long standing h/o chronic systolic heart failure due to nonischemic cardiomyopathy, T2DM and hx of acute promyelocytic leukemia s/p ATRA ([facility] 2014). Recent development of rapidly progressive heart failure,  NYHA class IIIb-IV symptoms. Admitted for for RHC and optimization prior to VAD implant.   RHC w/ elevated filling pressures and low output (RA 13, PAP 64/26 (40), PCWP 23, CI 2 L/min/m2) =>started on Milrinone  2/14 S/P HMIII VAD   Assessment/Plan:   1. Chronic HFrEF, NICM -> 09/24/22 S/P HMIII LVAD  - POD #4 - This morning, CVP 5, PA 34/13; 4.2 L urine output over the past 24h.  - Increasing speed by 100 to 5300.  - Decrease lasix drip to 58m/hr; will target net even to slightly negative I/Os.  - D/C PA catheter - INR 5 today; will hold warfarin dosing today. Plan to remove chest tubes once INR <2.5.  - Repeat CXR pending. - LDH improving, 249 today.   2.Acute blood loss anemia - Hgb 8.8 Follow daily CBC.   3.  NSVT/PVCS Rare PVCs.   4.  [facility]   5. . Hx of endocarditis: 2015  I reviewed the LVAD parameters from today, and compared the results to the patient's prior recorded data.  No programming changes were made.  The LVAD is functioning within specified parameters.  The patient performs LVAD self-test daily.  LVAD interrogation was negative for any significant power changes, alarms or PI events/speed drops.  LVAD equipment check completed and is in good working order.  Back-up equipment present.   LVAD education done on emergency procedures and precautions and reviewed exit site care.  Length of Stay: 6[place] DO 09/28/2022, 10:51 AM  VAD Team --- VAD ISSUES ONLY--- Pager [phone](7am -  7am)  Advanced Heart Failure Team  Pager [phone](M-F; 7a - 5p)  Please contact [facility] for night-coverage after hours (5p -7a ) and weekends on [email or web address]  CRITICAL CARE Performed by: AHebert Soho  Total critical care time: 35 minutes  Critical care time was exclusive of separately billable procedures and treating other patients.  Critical care was necessary to treat or prevent imminent or life-threatening deterioration.  Critical care was time spent personally by me on the following activities: development of treatment plan with patient and/or surrogate as well as nursing, discussions with consultants, evaluation of patient's response to treatment, examination of patient, obtaining history from patient or surrogate, ordering and performing treatments and interventions, ordering and review of laboratory studies, ordering and review of radiographic studies, pulse oximetry and re-evaluation of patient's condition.

## 2022-09-29 ENCOUNTER — Inpatient Hospital Stay (HOSPITAL_COMMUNITY): Payer: Medicare PPO

## 2022-09-29 DIAGNOSIS — D62 Acute posthemorrhagic anemia: Secondary | ICD-10-CM | POA: Diagnosis not present

## 2022-09-29 DIAGNOSIS — E440 Moderate protein-calorie malnutrition: Secondary | ICD-10-CM | POA: Diagnosis not present

## 2022-09-29 DIAGNOSIS — I5023 Acute on chronic systolic (congestive) heart failure: Secondary | ICD-10-CM | POA: Diagnosis not present

## 2022-09-29 DIAGNOSIS — R570 Cardiogenic shock: Secondary | ICD-10-CM | POA: Diagnosis not present

## 2022-09-29 LAB — TYPE AND SCREEN
ABO/RH(D): A POS
Antibody Screen: POSITIVE
Donor AG Type: NEGATIVE
Donor AG Type: NEGATIVE
Donor AG Type: NEGATIVE
Donor AG Type: NEGATIVE
Unit division: 0
Unit division: 0
Unit division: 0
Unit division: 0
Unit division: 0
Unit division: 0

## 2022-09-29 LAB — PREPARE FRESH FROZEN PLASMA: Unit division: 0

## 2022-09-29 LAB — BASIC METABOLIC PANEL
Anion gap: 11 (ref 5–15)
BUN: 21 mg/dL (ref 8–23)
CO2: 26 mmol/L (ref 22–32)
Calcium: 8.2 mg/dL — ABNORMAL LOW (ref 8.9–10.3)
Chloride: 96 mmol/L — ABNORMAL LOW (ref 98–111)
Creatinine, Ser: 1.17 mg/dL (ref 0.61–1.24)
GFR, Estimated: >60 mL/min (ref >60)
Glucose, Bld: 104 mg/dL — ABNORMAL HIGH (ref 70–99)
Potassium: 3.6 mmol/L (ref 3.5–5.1)
Sodium: 133 mmol/L — ABNORMAL LOW (ref 135–145)

## 2022-09-29 LAB — CBC WITH DIFFERENTIAL/PLATELET
Abs Immature Granulocytes: 0.04 10*3/uL (ref 0.00–0.07)
Basophils Absolute: 0 10*3/uL (ref 0.0–0.1)
Basophils Relative: 0 %
Eosinophils Absolute: 0 10*3/uL (ref 0.0–0.5)
Eosinophils Relative: 1 %
HCT: 24.7 % — ABNORMAL LOW (ref 39.0–52.0)
Hemoglobin: 8.5 g/dL — ABNORMAL LOW (ref 13.0–17.0)
Immature Granulocytes: 1 %
Lymphocytes Relative: 5 %
Lymphs Abs: 0.4 10*3/uL — ABNORMAL LOW (ref 0.7–4.0)
MCH: 31.3 pg (ref 26.0–34.0)
MCHC: 34.4 g/dL (ref 30.0–36.0)
MCV: 90.8 fL (ref 80.0–100.0)
Monocytes Absolute: 0.7 10*3/uL (ref 0.1–1.0)
Monocytes Relative: 9 %
Neutro Abs: 6.7 10*3/uL (ref 1.7–7.7)
Neutrophils Relative %: 84 %
Platelets: 168 10*3/uL (ref 150–400)
RBC: 2.72 MIL/uL — ABNORMAL LOW (ref 4.22–5.81)
RDW: 15.9 % — ABNORMAL HIGH (ref 11.5–15.5)
WBC: 7.9 10*3/uL (ref 4.0–10.5)
nRBC: 0 % (ref 0.0–0.2)

## 2022-09-29 LAB — BPAM RBC
Blood Product Expiration Date: 202403042359
Blood Product Expiration Date: 202403042359
Blood Product Expiration Date: 202403112359
Blood Product Expiration Date: 202403152359
Blood Product Expiration Date: 202403202359
Blood Product Expiration Date: 202403202359
ISSUE DATE / TIME: 202402140926
ISSUE DATE / TIME: 202402141049
ISSUE DATE / TIME: 202402141049
ISSUE DATE / TIME: 202402142244
Unit Type and Rh: 6200
Unit Type and Rh: 6200
Unit Type and Rh: 6200
Unit Type and Rh: 6200
Unit Type and Rh: 6200
Unit Type and Rh: 6200

## 2022-09-29 LAB — BPAM FFP
Blood Product Expiration Date: 202402232359
ISSUE DATE / TIME: 202402181749
Unit Type and Rh: 6200

## 2022-09-29 LAB — PROTIME-INR
INR: 4.6 (ref 0.8–1.2)
Prothrombin Time: 42.8 seconds — ABNORMAL HIGH (ref 11.4–15.2)

## 2022-09-29 LAB — GLUCOSE, CAPILLARY
Glucose-Capillary: 102 mg/dL — ABNORMAL HIGH (ref 70–99)
Glucose-Capillary: 123 mg/dL — ABNORMAL HIGH (ref 70–99)
Glucose-Capillary: 131 mg/dL — ABNORMAL HIGH (ref 70–99)
Glucose-Capillary: 167 mg/dL — ABNORMAL HIGH (ref 70–99)
Glucose-Capillary: 170 mg/dL — ABNORMAL HIGH (ref 70–99)
Glucose-Capillary: 192 mg/dL — ABNORMAL HIGH (ref 70–99)
Glucose-Capillary: 83 mg/dL (ref 70–99)

## 2022-09-29 LAB — COOXEMETRY PANEL
Carboxyhemoglobin: 1.6 % — ABNORMAL HIGH (ref 0.5–1.5)
Carboxyhemoglobin: 3 % — ABNORMAL HIGH (ref 0.5–1.5)
Carboxyhemoglobin: 3.4 % — ABNORMAL HIGH (ref 0.5–1.5)
Methemoglobin: <0.7 % (ref 0.0–1.5)
Methemoglobin: <0.7 % (ref 0.0–1.5)
Methemoglobin: <0.7 % (ref 0.0–1.5)
O2 Saturation: 74.3 %
O2 Saturation: 82.4 %
O2 Saturation: 78.8 %
Total hemoglobin: 9.5 g/dL — ABNORMAL LOW (ref 12.0–16.0)
Total hemoglobin: 8 g/dL — ABNORMAL LOW (ref 12.0–16.0)
Total hemoglobin: 8.5 g/dL — ABNORMAL LOW (ref 12.0–16.0)

## 2022-09-29 LAB — PHOSPHORUS: Phosphorus: 1.9 mg/dL — ABNORMAL LOW (ref 2.5–4.6)

## 2022-09-29 LAB — MAGNESIUM: Magnesium: 2 mg/dL (ref 1.7–2.4)

## 2022-09-29 LAB — LACTATE DEHYDROGENASE: LDH: 245 U/L — ABNORMAL HIGH (ref 98–192)

## 2022-09-29 MED ORDER — POTASSIUM PHOSPHATES 15 MMOLE/5ML IV SOLN
15.0000 mmol | Freq: Once | INTRAVENOUS | Status: DC
  Filled 2022-09-29: qty 5

## 2022-09-29 MED ORDER — PROSOURCE PLUS PO LIQD
30.0000 mL | Freq: Two times a day (BID) | ORAL | Status: DC
  Administered 2022-09-29: 30 mL via ORAL
  Filled 2022-09-29 (×3): qty 30

## 2022-09-29 MED ORDER — SENNA 8.6 MG PO TABS
1.0000 | ORAL_TABLET | Freq: Every day | ORAL | Status: DC
  Administered 2022-09-29 – 2022-10-07 (×6): 8.6 mg via ORAL
  Filled 2022-09-29 (×7): qty 1

## 2022-09-29 MED ORDER — SCOPOLAMINE 1 MG/3DAYS TD PT72
1.0000 | MEDICATED_PATCH | TRANSDERMAL | Status: DC
  Administered 2022-09-29 – 2022-10-02 (×2): 1.5 mg via TRANSDERMAL
  Filled 2022-09-29 (×2): qty 1

## 2022-09-29 MED ORDER — POTASSIUM PHOSPHATES 15 MMOLE/5ML IV SOLN
30.0000 mmol | Freq: Once | INTRAVENOUS | Status: AC
  Administered 2022-09-29: 30 mmol via INTRAVENOUS
  Filled 2022-09-29: qty 10

## 2022-09-29 MED ORDER — EPINEPHRINE HCL 5 MG/250ML IV SOLN IN NS: 1.0000 ug/min | INTRAVENOUS | Status: DC

## 2022-09-29 MED ORDER — ENSURE ENLIVE PO LIQD
237.0000 mL | Freq: Three times a day (TID) | ORAL | Status: DC
  Administered 2022-09-30 – 2022-10-05 (×5): 237 mL via ORAL

## 2022-09-29 NOTE — Progress Notes (Signed)
Physical Therapy Treatment Patient Details Name: Ricardo Zuniga MRN: [id number] DOB: February 14, 1951 Today's Date: 09/29/2022   History of Present Illness 72 yo M admitted 09/22/22  for RHC and optimization prior to LVAD HM3 implantation 09/24/22. 2/15 extubated. PMH: HFrEF, HTN, HLD, GERD, leukemia, NICM due to chemotherapy, multiple back surgeries, neck fusion, DM, neuropathy, shoulder surgery x 3.    PT Comments    Pt pleasant and limited by nausea this session. Pt able to transition from bed to chair with min +2 assist for lines and safety. Pt educated for power transition and battery use with pt able to initiate transition to and from battery and placement of controller neck strap. End of session pt verbalized precautions, LVAD and power parts and transition. Pt motivated and continue to anticipate successful mobility progression. Pt on 1L beginning and end of session at 96% and tolerated RA during session with sats 88-91%  HR 92 Speed 5400, PI 4.1, flow 3.7, power 3.9 BP 96/77 (85)   Recommendations for follow up therapy are one component of a multi-disciplinary discharge planning process, led by the attending physician.  Recommendations may be updated based on patient status, additional functional criteria and insurance authorization.  Follow Up Recommendations  Home health PT     Assistance Recommended at Discharge Intermittent Supervision/Assistance  Patient can return home with the following Help with stairs or ramp for entrance;Assist for transportation;Assistance with cooking/housework;A little help with walking and/or transfers;A little help with bathing/dressing/bathroom   Equipment Recommendations  Rollator (4 wheels);BSC/3in1    Recommendations for Other Services       Precautions / Restrictions Precautions Precautions: Sternal;Fall;Other (comment) Precaution Comments: LVAD, chest tubes     Mobility  Bed Mobility Overal bed mobility: Needs Assistance Bed Mobility:  Rolling, Sidelying to Sit Rolling: Min assist Sidelying to sit: Min assist       General bed mobility comments: cues for sequence,lines and safety with min assist to roll to right and rise from side. pt with good stability in sitting    Transfers Overall transfer level: Needs assistance   Transfers: Sit to/from Stand Sit to Stand: Min guard, +2 safety/equipment           General transfer comment: minguard with cues for hand placement and sequence. Pt able to rise from bed to RW and stand for grossly 2 min for pericare and linen change prior to pivot to chair    Ambulation/Gait               General Gait Details: pt denied attempting due to nausea   Stairs             Wheelchair Mobility    Modified Rankin (Stroke Patients Only)       Balance Overall balance assessment: Mild deficits observed, not formally tested                                          Cognition Arousal/Alertness: Awake/alert Behavior During Therapy: WFL for tasks assessed/performed Overall Cognitive Status: Within Functional Limits for tasks assessed                                 General Comments: educated for precautions        Exercises      General Comments  Pertinent Vitals/Pain Pain Assessment Pain Assessment: No/denies pain    Home Living                          Prior Function            PT Goals (current goals can now be found in the care plan section) Progress towards PT goals: Progressing toward goals    Frequency    Min 3X/week      PT Plan Current plan remains appropriate    Co-evaluation              AM-PAC PT "6 Clicks" Mobility   Outcome Measure  Help needed turning from your back to your side while in a flat bed without using bedrails?: A Little Help needed moving from lying on your back to sitting on the side of a flat bed without using bedrails?: A Little Help needed moving to  and from a bed to a chair (including a wheelchair)?: A Little Help needed standing up from a chair using your arms (e.g., wheelchair or bedside chair)?: A Little Help needed to walk in hospital room?: Total Help needed climbing 3-5 steps with a railing? : Total 6 Click Score: 14    End of Session   Activity Tolerance: Patient tolerated treatment well Patient left: with call bell/phone within reach;with nursing/sitter in room;in chair;with chair alarm set Nurse Communication: Mobility status PT Visit Diagnosis: Other abnormalities of gait and mobility (R26.89);Muscle weakness (generalized) (M62.81);Pain     Time: [id number] PT Time Calculation (min) (ACUTE ONLY): 34 min  Charges:  $Therapeutic Activity: 23-37 mins                     Bayard Males, PT Acute Rehabilitation Services Office: [phone]    [name] 09/29/2022, 9:05 AM

## 2022-09-29 NOTE — Progress Notes (Signed)
Brief Nutrition Note:   Noted diet advanced to [place] but +nausea still continues with is negatively impacting po intake. Noted scopolamine patch added today for nausea. Pt also on reglan, phenergan. Pt denies vomiting. Constipation has resolved, +multiple loose stools.   Pt reports he is able to tolerate Glucerna shakes. Discussed trial of Ensure Plus/Enlive as these contain more calories and more protein in same volume as Glucerna and intake of meals is essentially nil.  Pt agreeable to try. Plan to D/C Boost Breeze as well. Plan to trial protein modular, Pro-Source Plus.  Limited documentation of po intake but pt did not eat breakfast or lunch today. Inadequate x 5 days, discussed that if po appetite and intake does not improve may need to consider possible Cortrak placement with supplemental TF with pt. Pt verbalized understanding.   Noted phosphorus 1.9, receiving potassium phosphate supplementation today   Kerman Passey MS, RDN, LDN, CNSC Registered Dietitian 3 Clinical Nutrition RD Pager and On-Call Pager Number Located in [place]

## 2022-09-29 NOTE — Progress Notes (Signed)
EVENING ROUNDS NOTE :     [address]       [place] [address]             [phone]                 5 Days Post-Op Procedure(s) (LRB): INSERTION OF IMPLANTABLE LEFT VENTRICULAR ASSIST DEVICE AND AORTIC VALVE CLOSURE (N/A) TRANSESOPHAGEAL ECHOCARDIOGRAM (N/A)   Total Length of Stay:  LOS: 7 days  Events:   No events Stable day    BP (!) 102/90 (BP Location: Left Arm)   Pulse (!) 110   Temp 98.2 F (36.8 C) (Core (Comment))   Resp 16   Ht 5' 9"$  (1.753 m)   Wt 86.4 kg   SpO2 97%   BMI 28.13 kg/m   CVP:  [0 mmHg-10 mmHg] 0 mmHg      sodium chloride     sodium chloride     epinephrine Stopped (09/29/22 1201)   furosemide (LASIX) 200 mg in dextrose 5 % 100 mL (2 mg/mL) infusion 4 mg/hr (09/29/22 1600)   milrinone 0.25 mcg/kg/min (09/29/22 1600)   potassium PHOSPHATE IVPB (in mmol) 30 mmol (09/29/22 1629)   promethazine (PHENERGAN) injection (IM or IVPB) Stopped (09/29/22 1413)   vasopressin Stopped (09/27/22 1207)    I/O last 3 completed shifts: In: 2170.4 [P.O.:480; I.V.:1017.6; Blood:423; IV Piggyback:249.9] Out: [id number]; Chest Tube:775]      Latest Ref Rng & Units 09/29/2022    3:56 AM 09/28/2022    5:05 AM 09/27/2022    5:46 AM  CBC  WBC 4.0 - 10.5 K/uL 7.9  8.8    Hemoglobin 13.0 - 17.0 g/dL 8.5  8.8  11.2   Hematocrit 39.0 - 52.0 % 24.7  26.2  33.0   Platelets 150 - 400 K/uL 168  137         Latest Ref Rng & Units 09/29/2022    7:43 AM 09/28/2022    5:05 AM 09/27/2022    5:46 AM  BMP  Glucose 70 - 99 mg/dL 104  141    BUN 8 - 23 mg/dL 21  27    Creatinine 0.61 - 1.24 mg/dL 1.17  1.27    Sodium 135 - 145 mmol/L 133  132  130   Potassium 3.5 - 5.1 mmol/L 3.6  3.6  5.0   Chloride 98 - 111 mmol/L 96  97    CO2 22 - 32 mmol/L 26  24    Calcium 8.9 - 10.3 mg/dL 8.2  8.1      ABG    Component Value Date/Time   PHART 7.312 (L) 09/27/2022 0546   PCO2ART 45.6 09/27/2022 0546   PO2ART 78 (L) 09/27/2022 0546   HCO3 23.1  09/27/2022 0546   TCO2 24 09/27/2022 0546   ACIDBASEDEF 3.0 (H) 09/27/2022 0546   O2SAT 78.8 09/29/2022 1503       Melodie Bouillon, MD 09/29/2022 4:44 PM

## 2022-09-29 NOTE — Evaluation (Signed)
Occupational Therapy Evaluation Patient Details Name: Ricardo Zuniga MRN: [id number] DOB: [birth year] Today's Date: 09/29/2022   History of Present Illness 72 yo M admitted 09/22/22  for RHC and optimization prior to LVAD HM3 implantation 09/24/22. 2/15 extubated. PMH: HFrEF, HTN, HLD, GERD, leukemia, NICM due to chemotherapy, multiple back surgeries, neck fusion, DM, neuropathy, shoulder surgery x 3.   Clinical Impression   Patient admitted for the diagnosis above.  PTA he lies with his spouse, and reports increasing SOB with minimal activity.  Currently he is needing up to [place] for basic mobility, and Mod A for lower body ADL from a sit to stand level.  He is moving very well and should be able to transition home with [facility] OT depending on progress.  OT is indicated in the acute setting to address deficits listed.      Recommendations for follow up therapy are one component of a multi-disciplinary discharge planning process, led by the attending physician.  Recommendations may be updated based on patient status, additional functional criteria and insurance authorization.   Follow Up Recommendations  Home health OT     Assistance Recommended at Discharge Intermittent Supervision/Assistance  Patient can return home with the following Assist for transportation;Assistance with cooking/housework;A little help with bathing/dressing/bathroom;A little help with walking and/or transfers    Functional Status Assessment  Patient has had a recent decline in their functional status and demonstrates the ability to make significant improvements in function in a reasonable and predictable amount of time.  Equipment Recommendations  BSC/3in1    Recommendations for Other Services       Precautions / Restrictions Precautions Precautions: Sternal;Fall Precaution Comments: LVAD, chest tubes Restrictions Weight Bearing Restrictions: Yes      Mobility Bed Mobility Overal bed mobility: Needs  Assistance Bed Mobility: Sidelying to Sit, Sit to Sidelying   Sidelying to sit: Min assist     Sit to sidelying: Min assist   Patient Response: Cooperative  Transfers Overall transfer level: Needs assistance Equipment used: 2 person hand held assist Transfers: Sit to/from Stand Sit to Stand: Min guard                  Balance   Sitting-balance support: No upper extremity supported, Feet supported Sitting balance-Leahy Scale: Good     Standing balance support: Single extremity supported Standing balance-Leahy Scale: Fair                             ADL either performed or assessed with clinical judgement   ADL Overall ADL's : Needs assistance/impaired Eating/Feeding: Set up;Bed level   Grooming: Wash/dry hands;Wash/dry face;Sitting;Supervision/safety   Upper Body Bathing: Minimal assistance;Sitting   Lower Body Bathing: Moderate assistance;Sit to/from stand   Upper Body Dressing : Minimal assistance;Sitting   Lower Body Dressing: Moderate assistance;Sit to/from stand                       Vision Patient Visual Report: No change from baseline       Perception     Praxis      Pertinent Vitals/Pain Pain Assessment Pain Assessment: Faces Faces Pain Scale: Hurts little more Pain Location: chest Pain Descriptors / Indicators: Operative site guarding Pain Intervention(s): Monitored during session     Hand Dominance Left   Extremity/Trunk Assessment Upper Extremity Assessment Upper Extremity Assessment: Overall WFL for tasks assessed   Lower Extremity Assessment Lower Extremity Assessment: Defer to PT  evaluation   Cervical / Trunk Assessment Cervical / Trunk Assessment: Normal   Communication Communication Communication: No difficulties   Cognition Arousal/Alertness: Awake/alert Behavior During Therapy: WFL for tasks assessed/performed Overall Cognitive Status: Within Functional Limits for tasks assessed                                        General Comments   HR 93    Exercises     Shoulder Instructions      Home Living Family/patient expects to be discharged to:: Private residence Living Arrangements: Spouse/significant other Available Help at Discharge: Family;Available 24 hours/day Type of Home: House Home Access: Stairs to enter [employer] of Steps: 1 Entrance Stairs-Rails: None Home Layout: Two level;Able to live on main level with bedroom/bathroom;Bed/bath upstairs;Full bath on main level Alternate Level Stairs-Number of Steps: 5-6 landing 5-6 to bed room Alternate Level Stairs-Rails: Left Bathroom Shower/Tub: Hospital doctor Toilet: Handicapped height Bathroom Accessibility: Yes   Home Equipment: Conservation officer, nature (2 wheels);Cane - single point;Shower seat - built in          Prior Functioning/Environment Prior Level of Function : Independent/Modified Independent;Driving             Mobility Comments: Independent community ambulator, drives ADLs Comments: Independent        OT Problem List: Impaired balance (sitting and/or standing);Decreased activity tolerance;Decreased strength;Pain      OT Treatment/Interventions: Self-care/ADL training;Therapeutic activities;Patient/family education;Balance training;DME and/or AE instruction;Energy conservation    OT Goals(Current goals can be found in the care plan section) Acute Rehab OT Goals Patient Stated Goal: Return home OT Goal Formulation: With patient Time For Goal Achievement: 10/13/22 Potential to Achieve Goals: Good ADL Goals Pt Will Perform Grooming: with supervision;standing Pt Will Perform Lower Body Dressing: with supervision;sit to/from stand Pt Will Transfer to Toilet: with supervision;ambulating;regular height toilet  OT Frequency: Min 2X/week    Co-evaluation              AM-PAC OT "6 Clicks" Daily Activity     Outcome Measure Help from another person eating  meals?: None Help from another person taking care of personal grooming?: A Little Help from another person toileting, which includes using toliet, bedpan, or urinal?: A Lot Help from another person bathing (including washing, rinsing, drying)?: A Lot Help from another person to put on and taking off regular upper body clothing?: A Little Help from another person to put on and taking off regular lower body clothing?: A Lot 6 Click Score: 16   End of Session Nurse Communication: Mobility status  Activity Tolerance: Patient tolerated treatment well Patient left: in bed;with call bell/phone within reach;with family/visitor present  OT Visit Diagnosis: Unsteadiness on feet (R26.81)                Time: [id number] OT Time Calculation (min): 16 min Charges:  OT General Charges $OT Visit: 1 Visit OT Evaluation $OT Eval Moderate Complexity: 1 Mod  09/29/2022  RP, OTR/L  Acute Rehabilitation Services  Office:  [phone]   Metta Clines 09/29/2022, 4:37 PM

## 2022-09-29 NOTE — Progress Notes (Signed)
NAME:  Ricardo Zuniga, MRN:  [id number], DOB:  11-19-1950, LOS: 7 ADMISSION DATE:  09/22/2022, CONSULTATION DATE:  09/24/2022 REFERRING MD:  Enter (CT surgery), CHIEF COMPLAINT: Vent management s/p LVAD implantation  History of Present Illness:  The patient is a 72 yo M with PMH of HFrEF (possibly d/t endocarditis), T2DM, APML s/p ATRA in 2014 who just underwent LVAD implantation on 2/14.  Was previously admitted in 05/2022 for orthopnea, PND, and LE edema and echo showed LVEF of 30-35%.  Heart cath showed CAD and patient was discharged on GDMT.  Repeat echo 1/24 showed EF 25%, severe LV dilation, global hypokinesis, moderate RV dysfunction, mild RV enlargement, and mild-to moderate MR; rapid deterioration from previous health.  LVAD implantation was urgently planned.   Procedure was successful and patient transferred to ICU 2/14 PM, sedated and intubated.  Did require Glide for difficult airway.  Received rifampin, vancomycin, and cefazolin for preoperative prophylaxis.  Transferred to ICU on pressors, NO, and milrinone, weaning off ventilator and NO.  Extubated morning 2/15 and working on recovery since.  Pertinent  Medical History   Diagnosis   APML (acute promyelocytic leukemia) in remission ([place])    Completed treatment 04/2013   HFrEF    EF 25% on 09/03/2022   Coronary artery disease    Nonobstructive at cardiac catheterization 09/2018   Gastroesophageal reflux disease   Hypercholesteremia   Neuropathy    bilateral feet   Nonischemic cardiomyopathy ([place])    a. EF 20% in 2015 - thought to possibly be viral induced or chemotherapy induced from treatments in 2014 b. at 35-40% by echo in 05/2016    Peptic ulcer disease   Type 2 diabetes mellitus ([place])   Vertigo    tx with valium prn   Significant Hospital Events: Including procedures, antibiotic start and stop dates in addition to other pertinent events   2/12 - Admitted for RHC and optimization 2/14 - LVAD implantation, transferred  to ICU 2/15 - Extubated, progressive ambulation and diet 2/16 - Nausea, still no BM, clear liquid diet as tolerated.  Interim History / Subjective:  Still having nausea. No bleeding.    Objective   Blood pressure 92/80, pulse 94, temperature 98.4 F (36.9 C), temperature source Core, resp. rate 17, height 5' 9"$  (1.753 m), weight 86.4 kg, SpO2 97 %. CVP:  [7 mmHg-14 mmHg] 14 mmHg      Intake/Output Summary (Last 24 hours) at 09/29/2022 1825 Last data filed at 09/29/2022 1700 Gross per 24 hour  Intake 1084.52 ml  Output 2875 ml  Net -1790.48 ml    Filed Weights   09/27/22 0400 09/28/22 0500 09/29/22 0441  Weight: 90.7 kg 87.6 kg 86.4 kg   Examination: General: elderly man sitting up in bed in NAD HEENT: [place]/AT, eyes anicteric Lungs:  breathing comfortably on [place], no conversational dyspnea Cardiovascular: mechanical hum of VAD Abdomen:  soft, NT GU: foley with clear yellow urine Extremities: +edema Skin: warm, dry, no rashes Neuro: awake, alert, moving all extremities, normal speech  Sodium 133 BUN 21 Creatinine 1.17 LDH 245 WBC 7.9 H/H 8.5/24.7 Platelets 137 Co. oximetry 82% INR 4.6 CXR personally reviewed> atelectasis, VAD, possible small left effusion   Assessment & Plan:  Acute on chronic HFrEF with cardiogenic shock s/p LVAD implantation Postoperative expected vasoplegic shock -serial coox -con't milrinone, weaning off epinephrine -pain control -Digoxin, farxiga -hold warfarin today; anticipate he will come down slowly due to poor PO intake -lasix, electrolyte repletion -progressive mobility as able -serial  LDH monitoring  Supratherapeutic INR -hold warfarin -daily INR  Nausea, poor appetite postop Constipation due to medications --reglan -scopolamine patch -zofran, phenergan PRN -bowel regimen  T2DM, A1c 7.0  -[place] -SSI PRN -goal BG <180  HLD -statin  Hypokalemia Hypophosphatemia  -repleted -monitor  Acute blood loss anemia,  expected operative blood loss Consumptive thrombocytopenia - monitor closely for bleeding; hold warfarin -no need for transfusion currently  Hypervolemic hyponatremia -lasix gtt  AKI, likely congestive; resolved -diuresis -strict I/O -renally dose meds, avoid nephrotoxic meds  Best Practice (right click and "Reselect all SmartList Selections" daily)   Diet/type: clear liquids, advance as tolerated DVT prophylaxis: Coumadin GI prophylaxis: N/A Lines: Central line, Arterial Line, and yes and it is still needed Foley:  removal ordered  Code Status:  full code Last date of multidisciplinary goals of care discussion: per primary   This patient is critically ill with multiple organ system failure which requires frequent high complexity decision making, assessment, support, evaluation, and titration of therapies. This was completed through the application of advanced monitoring technologies and extensive interpretation of multiple databases. During this encounter critical care time was devoted to patient care services described in this note for 33 minutes.  Julian Hy, DO 09/29/22 6:25 PM [facility]  For contact information, see Amion. If no response to pager, please call PCCM consult pager. After hours, 7PM- 7AM, please call Elink.

## 2022-09-29 NOTE — Progress Notes (Signed)
LVAD Coordinator Rounding Note:  Admitted 09/22/22 to Dr. Conception Chancy service for [place] and optimization prior to VAD implant.   HeartMate III LVAD implanted on 09/25/22 by Dr.Enter under DT criteria.   Patient awake and alert this morning. Pt is c/o extreme nausea this morning. He has several meds on board to help with nausea. We will add scopolamine per PVT.  Patient VAD flows at 4.0L this morning. Speed increase to 5300 per [name].  CVP 10-11 this morning.  Lasix gtt at 4 mg/hr. Plan to leave chest tubes till INR below 2.5 per MD. Per MD will wean off Epi today. INR 4.6 today -  will leave foley.  Pt is not eating, diet advanced to full liquids.   Vital signs: Temp: 98.1  HR:  94 Automatic BP: 98/53 (67) Doppler MAP: 94 O2 Sat: 98 on RA Wt: 194>195.7>199.9>193.2>190.4lbs  LVAD interrogation reveals:  Speed:  5300 Flow: 3.7 Power:3.8w PI: 3.5 Alarms: none  Events: none Fixed speed:  5300 Low speed limit: 5000 HCT: 24  Drive Line: Existing VAD dressing removed and site care performed using sterile technique. Drive line exit site cleaned with Chlora prep applicators x 2, allowed to dry, and [place] dressing with Silver strip applied. Exit site healing and unincorporated, the velour is fully implanted at exit site. No redness, tenderness, drainage, foul odor or rash noted. 3 sutures intact. Drive line anchor re-applied. Pt denies fever or chills. Continue every other day dressing changes. Next dressing change due 10/01/22 by VAD Coordinator or nurse champion.     Labs:  Hgb:  9.7>9.6>8.5  WBC:  10.8>15.5>7.9  LDH trend:  [id number]  INR trend:  1.6>1.7>1.8>5.5>4.6  Co-ox: 74.2>68.7>75.3>68.4>74  Creatinine: 1.4>0.92>1.41>1.49>1.27>1.17  LDH: 284>338>268>249>245  Intra-op blood products: 4 FFP Cell Saver 650 mL  ICU Blood Products: 09/25/22 1 PRBC  Arrhythmias:   Gtts: Epinephrine: 1 mcg/min Vasopressin: 0.04 units/min stopped 09/27/22 Milrinone:  0.5mg/kg/min Lasix 4 mg/hr  Ventilator Status:  Extubated 09/25/22  Nitric oxide:  Weaned off 09/25/22  Anticoagulation Plan: INR Goal: 2-2.5 ASA dose:3223m> Aspirin 8132mn hold Coumadin on hold tonight due to INR of 4.6  Adverse Events on VAD:   Patient Education: Patient's wife VanLorriane Shirerformed dressing change x 1.   Plan/Recommendations:  1. Page VAD coordinator for any issues with VAD equipment or driveline. 2. Every other day dressing changes by VAD coordinator or nurse champion.   SarTanda Rockers, BSN VAD Coordinator 24/7 Pager [phone]

## 2022-09-29 NOTE — Progress Notes (Signed)
T[facility] Progress Note: 5 Days Post-Op Procedure(s) (LRB): INSERTION OF IMPLANTABLE LEFT VENTRICULAR ASSIST DEVICE AND AORTIC VALVE CLOSURE (N/A) TRANSESOPHAGEAL ECHOCARDIOGRAM (N/A)  LOS: 7 days   Sitting up talking Mild nauesae Flows 3.7L / 5300  Plan today Keep lasix same dose aim neg 1L Hold coumadin Wean epi to off Same milrinone Full liquids and supplements Scopalamine patch for nausea     Latest Ref Rng & Units 09/29/2022    3:56 AM 09/28/2022    5:05 AM 09/27/2022    5:46 AM  CBC  WBC 4.0 - 10.5 K/uL 7.9  8.8    Hemoglobin 13.0 - 17.0 g/dL 8.5  8.8  11.2   Hematocrit 39.0 - 52.0 % 24.7  26.2  33.0   Platelets 150 - 400 K/uL 168  137         Latest Ref Rng & Units 09/29/2022    7:43 AM 09/28/2022   11:29 AM 09/28/2022    5:05 AM  CMP  Glucose 70 - 99 mg/dL 104   141   BUN 8 - 23 mg/dL 21   27   Creatinine 0.61 - 1.24 mg/dL 1.17   1.27   Sodium 135 - 145 mmol/L 133   132   Potassium 3.5 - 5.1 mmol/L 3.6   3.6   Chloride 98 - 111 mmol/L 96   97   CO2 22 - 32 mmol/L 26   24   Calcium 8.9 - 10.3 mg/dL 8.2   8.1   Total Protein 6.5 - 8.1 g/dL  6.1    Total Bilirubin 0.3 - 1.2 mg/dL  0.6    Alkaline Phos 38 - 126 U/L  57    AST 15 - 41 U/L  28    ALT 0 - 44 U/L  12      ABG    Component Value Date/Time   PHART 7.312 (L) 09/27/2022 0546   PCO2ART 45.6 09/27/2022 0546   PO2ART 78 (L) 09/27/2022 0546   HCO3 23.1 09/27/2022 0546   TCO2 24 09/27/2022 0546   ACIDBASEDEF 3.0 (H) 09/27/2022 0546   O2SAT 78.8 09/29/2022 1503

## 2022-09-29 NOTE — Progress Notes (Signed)
Patient ID: Ricardo Zuniga, male   DOB: Apr 14, 1951, 72 y.o.   MRN: [id number]   Advanced Heart Failure VAD Team Note  PCP-Cardiologist: [name], MD   Subjective:    2/15 S/P HMIII LVAD.   Currently on Milrinone 0.25 mcg, Epi 2, lasix 4 mg/hr. Co-ox 74% with CVP 11.  I/Os net negative 1840, pending BMET.   Nauseated still, no vomiting.  Had 3 BMs.   LVAD INTERROGATION:  HeartMate III LVAD:   Flow 3.8 liters/min, speed 5300, power 3.8, PI 3.5,  no PI events.   LDH 245 INR 4.7  Objective:    Vital Signs:   Temp:  [97.2 F (36.2 C)-99.5 F (37.5 C)] 98.8 F (37.1 C) (02/19 0215) Pulse Rate:  [90-117] 101 (02/19 0215) Resp:  [12-32] 19 (02/19 0215) BP: (87-118)/(67-100) 118/100 (02/19 0400) SpO2:  [78 %-100 %] 95 % (02/19 0215) Weight:  [86.4 kg] 86.4 kg (02/19 0441) Last BM Date : 09/28/22 Mean arterial Pressure 90s-100s  Intake/Output:   Intake/Output Summary (Last 24 hours) at 09/29/2022 0733 Last data filed at 09/29/2022 0600 Gross per 24 hour  Intake 905.32 ml  Output 2745 ml  Net -1839.68 ml    CVP 11 Physical Exam    General: Well appearing this am. NAD.  HEENT: Normal. Neck: Supple, JVP 10-12 cm. Carotids OK.  Cardiac:  Mechanical heart sounds with LVAD hum present.  Lungs:  CTAB, normal effort.  Abdomen:  NT, ND, no HSM. No bruits or masses. +BS  LVAD exit site: Well-healed and incorporated. Dressing dry and intact. No erythema or drainage. Stabilization device present and accurately applied. Driveline dressing changed daily per sterile technique. Extremities:  Warm and dry. No cyanosis, clubbing, rash. 1+ ankle edema.  Neuro:  Alert & oriented x 3. Cranial nerves grossly intact. Moves all 4 extremities w/o difficulty. Affect pleasant     Telemetry  SR 90s   EKG    N/A   Labs   Basic Metabolic Panel: Recent Labs  Lab 09/25/22 0258 09/25/22 0507 09/25/22 1600 09/26/22 0348 09/26/22 2352 09/27/22 0415 09/27/22 0546 09/28/22 0505  09/29/22 0356  NA 137   < > 134*  137 133* 129* 130* 130* 132*  --   K 4.4   < > 4.5  4.6 4.7 5.0 5.0 5.0 3.6  --   CL 105  --  105 102 97* 96*  --  97*  --   CO2 22  --  22 22 22 24  $ --  24  --   GLUCOSE 117*  --  136* 104* 245* 213*  --  141*  --   BUN 17  --  16 16 24* 26*  --  27*  --   CREATININE 1.08  --  1.04 0.92 1.41* 1.49*  --  1.27*  --   CALCIUM 8.4*  --  8.2* 8.4* 8.2* 8.4*  --  8.1*  --   MG 2.4  --  2.4 2.3  --  2.3  --  2.0 2.0  PHOS 3.1  --   --  2.5  --  3.3  --  2.1* 1.9*   < > = values in this interval not displayed.    Liver Function Tests: Recent Labs  Lab 09/23/22 0416 09/25/22 0258 09/26/22 0348 09/27/22 0415 09/28/22 1129  AST 19 54* 70* 39 28  ALT 24 20 20 17 12  $ ALKPHOS 66 43 49 58 57  BILITOT 1.0 1.9* 1.4* 0.5 0.6  PROT 6.4*  6.1* 6.4* 6.2* 6.1*  ALBUMIN 3.3* 3.9 3.8 3.5 3.1*   No results for input(s): "LIPASE", "AMYLASE" in the last 168 hours. No results for input(s): "AMMONIA" in the last 168 hours.  CBC: Recent Labs  Lab 09/26/22 0348 09/26/22 2352 09/27/22 0415 09/27/22 0546 09/28/22 0505 09/29/22 0356  WBC 15.5* 14.1* 14.8*  --  8.8 7.9  NEUTROABS 13.4* 12.2* 12.4*  --  7.3 6.7  HGB 9.6* 8.8* 9.1* 11.2* 8.8* 8.5*  HCT 28.6* 27.6* 27.9* 33.0* 26.2* 24.7*  MCV 92.3 95.2 94.3  --  92.6 90.8  PLT 126* 129* 128*  --  137* 168    INR: Recent Labs  Lab 09/26/22 0348 09/27/22 0415 09/28/22 0505 09/28/22 1231 09/29/22 0356  INR 1.7* 1.8* 5.5* 6.7* 4.6*    Other results: EKG:    Imaging   DG CHEST PORT 1 VIEW  Result Date: 09/28/2022 CLINICAL DATA:  Shortness of breath. EXAM: PORTABLE CHEST 1 VIEW COMPARISON:  Prior chest radiographs 09/27/2022 and earlier. FINDINGS: Prior median sternotomy. Cardiomegaly. Left ventricular assist device. Aortic atherosclerosis. Right IJ approach introducer sheath with Swan-Ganz catheter in the expected location of the right pulmonary artery. Chest tubes/mediastinal drains. Central pulmonary  vascular congestion without overt pulmonary edema. Small bilateral pleural effusions (greater on the right). Additional opacity at the right lung base which may reflect atelectasis. However, pneumonia cannot be excluded. No evidence of pneumothorax. IMPRESSION: 1. Support apparatus as described. 2. Cardiomegaly 3. Central pulmonary vascular congestion without overt pulmonary edema. 4. Small bilateral pleural effusions (greater on the right), similar to the prior examination of 09/27/2022. Persistent opacity within the right lung base. This may reflect atelectasis. However, correlate clinically to exclude signs/symptoms that would suggest pneumonia. 5. Aortic Atherosclerosis (ICD10-I70.0). Electronically Signed   By: Kellie Simmering D.O.   On: 09/28/2022 11:37     Medications:     Scheduled Medications:  acetaminophen  1,000 mg Oral Q6H   amiodarone  200 mg Oral BID   atorvastatin  40 mg Oral Daily   Chlorhexidine Gluconate Cloth  6 each Topical Daily   digoxin  0.0625 mg Oral Daily   docusate sodium  200 mg Oral BID   empagliflozin  10 mg Oral Daily   feeding supplement  1 Container Oral TID BM   feeding supplement (GLUCERNA SHAKE)  237 mL Oral TID BM   insulin aspart  0-24 Units Subcutaneous Q4H   metoCLOPramide (REGLAN) injection  10 mg Intravenous Q8H   pantoprazole  40 mg Oral Daily   polyethylene glycol  17 g Oral Daily   senna  1 tablet Oral Daily   sodium chloride flush  10-40 mL Intracatheter Q12H   sodium chloride flush  3 mL Intravenous Q12H   Warfarin - Physician Dosing Inpatient   Does not apply q1600    Infusions:  sodium chloride     sodium chloride     epinephrine     furosemide (LASIX) 200 mg in dextrose 5 % 100 mL (2 mg/mL) infusion 4 mg/hr (09/29/22 0200)   milrinone 0.25 mcg/kg/min (09/29/22 0401)   promethazine (PHENERGAN) injection (IM or IVPB) 12.5 mg (09/29/22 0333)   vasopressin Stopped (09/27/22 1207)    PRN Medications: Place/Maintain arterial line **AND**  sodium chloride, acetaminophen, albuterol, dextrose, morphine injection, ondansetron (ZOFRAN) IV, mouth rinse, mouth rinse, mouth rinse, oxyCODONE, promethazine (PHENERGAN) injection (IM or IVPB), sodium chloride flush, sodium chloride flush, traMADol, traZODone   Patient Profile  72 y/o male w/ long standing h/o chronic systolic  heart failure due to nonischemic cardiomyopathy, T2DM and hx of acute promyelocytic leukemia s/p ATRA ([facility] 2014). Recent development of rapidly progressive heart failure,  NYHA class IIIb-IV symptoms. Admitted for for RHC and optimization prior to VAD implant.   RHC w/ elevated filling pressures and low output (RA 13, PAP 64/26 (40), PCWP 23, CI 2 L/min/m2) =>started on Milrinone  2/14 S/P HMIII VAD   Assessment/Plan:    1. Chronic HFrEF, NICM -> 09/24/22 S/P HMIII LVAD: Currently on milrinone 0.25, epinephrine 2, Lasix gtt 4 mg/hr.  CVP 10-11 with co-ox 74%.  I/Os net negative -1840.  LVAD parameters stable, speed increased to 5300 on 2/18.   - Continue Lasix gtt 4 mg/hr aim for gentle net negative.  - Decrease epinephrine to 1 then stop, repeat co-ox after this.  - Continue milrinone 0.25.  - INR 4.7 today; will hold warfarin dosing today. Plan to remove chest tubes once INR <2.5.  - CXR today.  2.Acute blood loss anemia: hgb stable at 8.5.  3. NSVT/PVCS: Rare PVCs. 4.  Hx of promyelocytic leukemia: S/P ATRA in 2014 at [facility].  5.  Hx of endocarditis: 2015 6.  Nausea: Has had BMs.  Still with nausea.  - Advance to full liquids.   I reviewed the LVAD parameters from today, and compared the results to the patient's prior recorded data.  No programming changes were made.  The LVAD is functioning within specified parameters.  The patient performs LVAD self-test daily.  LVAD interrogation was negative for any significant power changes, alarms or PI events/speed drops.  LVAD equipment check completed and is in good working order.  Back-up equipment present.   LVAD  education done on emergency procedures and precautions and reviewed exit site care.  Length of Stay: 7  Loralie Champagne, MD 09/29/2022, 7:33 AM  VAD Team --- VAD ISSUES ONLY--- Pager [phone] (7am - 7am)  Advanced Heart Failure Team  Pager [phone] (M-F; 7a - 5p)  Please contact [facility] for night-coverage after hours (5p -7a ) and weekends on [email or web address]  CRITICAL CARE Performed by: Loralie Champagne  Total critical care time: 35 minutes  Critical care time was exclusive of separately billable procedures and treating other patients.  Critical care was necessary to treat or prevent imminent or life-threatening deterioration.  Critical care was time spent personally by me on the following activities: development of treatment plan with patient and/or surrogate as well as nursing, discussions with consultants, evaluation of patient's response to treatment, examination of patient, obtaining history from patient or surrogate, ordering and performing treatments and interventions, ordering and review of laboratory studies, ordering and review of radiographic studies, pulse oximetry and re-evaluation of patient's condition.

## 2022-09-30 ENCOUNTER — Encounter (HOSPITAL_COMMUNITY): Payer: Medicare PPO | Admitting: Cardiology

## 2022-09-30 DIAGNOSIS — I5023 Acute on chronic systolic (congestive) heart failure: Secondary | ICD-10-CM | POA: Diagnosis not present

## 2022-09-30 DIAGNOSIS — E876 Hypokalemia: Secondary | ICD-10-CM

## 2022-09-30 DIAGNOSIS — R110 Nausea: Secondary | ICD-10-CM | POA: Diagnosis not present

## 2022-09-30 LAB — BASIC METABOLIC PANEL
Anion gap: 12 (ref 5–15)
BUN: 14 mg/dL (ref 8–23)
CO2: 28 mmol/L (ref 22–32)
Calcium: 8.3 mg/dL — ABNORMAL LOW (ref 8.9–10.3)
Chloride: 94 mmol/L — ABNORMAL LOW (ref 98–111)
Creatinine, Ser: 1.02 mg/dL (ref 0.61–1.24)
GFR, Estimated: >60 mL/min (ref >60)
Glucose, Bld: 87 mg/dL (ref 70–99)
Potassium: 3.3 mmol/L — ABNORMAL LOW (ref 3.5–5.1)
Sodium: 134 mmol/L — ABNORMAL LOW (ref 135–145)

## 2022-09-30 LAB — CBC WITH DIFFERENTIAL/PLATELET
Abs Immature Granulocytes: 0.03 10*3/uL (ref 0.00–0.07)
Basophils Absolute: 0 10*3/uL (ref 0.0–0.1)
Basophils Relative: 0 %
Eosinophils Absolute: 0.1 10*3/uL (ref 0.0–0.5)
Eosinophils Relative: 1 %
HCT: 26.7 % — ABNORMAL LOW (ref 39.0–52.0)
Hemoglobin: 9.1 g/dL — ABNORMAL LOW (ref 13.0–17.0)
Immature Granulocytes: 1 %
Lymphocytes Relative: 8 %
Lymphs Abs: 0.5 10*3/uL — ABNORMAL LOW (ref 0.7–4.0)
MCH: 30.6 pg (ref 26.0–34.0)
MCHC: 34.1 g/dL (ref 30.0–36.0)
MCV: 89.9 fL (ref 80.0–100.0)
Monocytes Absolute: 0.7 10*3/uL (ref 0.1–1.0)
Monocytes Relative: 10 %
Neutro Abs: 5.1 10*3/uL (ref 1.7–7.7)
Neutrophils Relative %: 80 %
Platelets: 195 10*3/uL (ref 150–400)
RBC: 2.97 MIL/uL — ABNORMAL LOW (ref 4.22–5.81)
RDW: 15.9 % — ABNORMAL HIGH (ref 11.5–15.5)
WBC: 6.4 10*3/uL (ref 4.0–10.5)
nRBC: 0 % (ref 0.0–0.2)

## 2022-09-30 LAB — GLUCOSE, CAPILLARY
Glucose-Capillary: 73 mg/dL (ref 70–99)
Glucose-Capillary: 100 mg/dL — ABNORMAL HIGH (ref 70–99)
Glucose-Capillary: 193 mg/dL — ABNORMAL HIGH (ref 70–99)
Glucose-Capillary: 135 mg/dL — ABNORMAL HIGH (ref 70–99)
Glucose-Capillary: 153 mg/dL — ABNORMAL HIGH (ref 70–99)
Glucose-Capillary: 196 mg/dL — ABNORMAL HIGH (ref 70–99)

## 2022-09-30 LAB — COOXEMETRY PANEL
Carboxyhemoglobin: 3.2 % — ABNORMAL HIGH (ref 0.5–1.5)
Methemoglobin: <0.7 % (ref 0.0–1.5)
O2 Saturation: 79.5 %
Total hemoglobin: 8.9 g/dL — ABNORMAL LOW (ref 12.0–16.0)

## 2022-09-30 LAB — LACTATE DEHYDROGENASE: LDH: 213 U/L — ABNORMAL HIGH (ref 98–192)

## 2022-09-30 LAB — PROTIME-INR
INR: 3.5 — ABNORMAL HIGH (ref 0.8–1.2)
Prothrombin Time: 34.9 seconds — ABNORMAL HIGH (ref 11.4–15.2)

## 2022-09-30 LAB — MAGNESIUM: Magnesium: 1.8 mg/dL (ref 1.7–2.4)

## 2022-09-30 LAB — PHOSPHORUS: Phosphorus: 2.7 mg/dL (ref 2.5–4.6)

## 2022-09-30 MED ORDER — SODIUM CHLORIDE 0.9 % IV SOLN: INTRAVENOUS | Status: DC | PRN

## 2022-09-30 MED ORDER — POTASSIUM CHLORIDE CRYS ER 20 MEQ PO TBCR
40.0000 meq | EXTENDED_RELEASE_TABLET | Freq: Once | ORAL | Status: AC
  Administered 2022-09-30: 40 meq via ORAL
  Filled 2022-09-30: qty 2

## 2022-09-30 MED ORDER — AMIODARONE HCL 200 MG PO TABS
200.0000 mg | ORAL_TABLET | Freq: Every day | ORAL | Status: DC
  Administered 2022-09-30 – 2022-10-03 (×4): 200 mg via ORAL
  Filled 2022-09-30 (×4): qty 1

## 2022-09-30 MED ORDER — SPIRONOLACTONE 12.5 MG HALF TABLET
12.5000 mg | ORAL_TABLET | Freq: Every day | ORAL | Status: DC
  Administered 2022-09-30: 12.5 mg via ORAL
  Filled 2022-09-30: qty 1

## 2022-09-30 MED ORDER — MAGNESIUM SULFATE 2 GM/50ML IV SOLN
2.0000 g | Freq: Once | INTRAVENOUS | Status: AC
  Administered 2022-09-30: 2 g via INTRAVENOUS
  Filled 2022-09-30: qty 50

## 2022-09-30 MED ORDER — METOCLOPRAMIDE HCL 5 MG/ML IJ SOLN
5.0000 mg | Freq: Three times a day (TID) | INTRAMUSCULAR | Status: DC
  Administered 2022-09-30 – 2022-10-07 (×21): 5 mg via INTRAVENOUS
  Filled 2022-09-30 (×21): qty 2

## 2022-09-30 NOTE — Progress Notes (Addendum)
Patient ID: Ricardo Zuniga, male   DOB: 1951-02-20, 72 y.o.   MRN: [id number]   Advanced Heart Failure VAD Team Note  PCP-Cardiologist: [name], MD   Subjective:    2/15 S/P HMIII LVAD.   Currently on Milrinone 0.25 mcg. CO-OX 79.5%   CVP 9    LVAD INTERROGATION:  HeartMate III LVAD:   Flow 3.6  liters/min, speed 5300, power 4, PI 4.3 ,  no PI events.   LDH 213 INR 3.5   Objective:    Vital Signs:   Temp:  [97.2 F (36.2 C)-99.3 F (37.4 C)] 97.9 F (36.6 C) (02/20 0603) Pulse Rate:  [87-124] 92 (02/20 0603) Resp:  [14-29] 19 (02/20 0603) BP: (90-120)/(53-96) 101/77 (02/20 0603) SpO2:  [81 %-99 %] 95 % (02/20 0603) Weight:  [84.2 kg] 84.2 kg (02/20 0603) Last BM Date : 09/29/22 Mean arterial Pressure  90s  Intake/Output:   Intake/Output Summary (Last 24 hours) at 09/30/2022 0730 Last data filed at 09/30/2022 0650 Gross per 24 hour  Intake 1311.83 ml  Output 3650 ml  Net -2338.17 ml     Physical Exam    Physical Exam: GENERAL: No acute distress. In the chair.  HEENT: normal  NECK: Supple, JVP 7-8 .  2+ bilaterally, no bruits.  No lymphadenopathy or thyromegaly appreciated.   CARDIAC:  Mechanical heart sounds with LVAD hum present.  LUNGS:  Clear to auscultation bilaterally.  ABDOMEN:  Soft, round, nontender, positive bowel sounds x4.     LVAD exit site:  Dressing dry and intact.  No erythema or drainage.  Stabilization device present and accurately applied.   EXTREMITIES:  Warm and dry, no cyanosis, clubbing, rash or edema  NEUROLOGIC:  Alert and oriented x 3.    No aphasia.  No dysarthria.  Affect pleasant.      Telemetry  SR 90s   EKG    N/A   Labs   Basic Metabolic Panel: Recent Labs  Lab 09/26/22 0348 09/26/22 2352 09/27/22 0415 09/27/22 0546 09/28/22 0505 09/29/22 0356 09/29/22 0743 09/30/22 0417  NA 133* 129* 130* 130* 132*  --  133* 134*  K 4.7 5.0 5.0 5.0 3.6  --  3.6 3.3*  CL 102 97* 96*  --  97*  --  96* 94*  CO2 22 22  24  $ --  24  --  26 28  GLUCOSE 104* 245* 213*  --  141*  --  104* 87  BUN 16 24* 26*  --  27*  --  21 14  CREATININE 0.92 1.41* 1.49*  --  1.27*  --  1.17 1.02  CALCIUM 8.4* 8.2* 8.4*  --  8.1*  --  8.2* 8.3*  MG 2.3  --  2.3  --  2.0 2.0  --  1.8  PHOS 2.5  --  3.3  --  2.1* 1.9*  --  2.7    Liver Function Tests: Recent Labs  Lab 09/25/22 0258 09/26/22 0348 09/27/22 0415 09/28/22 1129  AST 54* 70* 39 28  ALT 20 20 17 12  $ ALKPHOS 43 49 58 57  BILITOT 1.9* 1.4* 0.5 0.6  PROT 6.1* 6.4* 6.2* 6.1*  ALBUMIN 3.9 3.8 3.5 3.1*   No results for input(s): "LIPASE", "AMYLASE" in the last 168 hours. No results for input(s): "AMMONIA" in the last 168 hours.  CBC: Recent Labs  Lab 09/26/22 2352 09/27/22 0415 09/27/22 0546 09/28/22 0505 09/29/22 0356 09/30/22 0417  WBC 14.1* 14.8*  --  8.8 7.9 6.4  NEUTROABS 12.2* 12.4*  --  7.3 6.7 5.1  HGB 8.8* 9.1* 11.2* 8.8* 8.5* 9.1*  HCT 27.6* 27.9* 33.0* 26.2* 24.7* 26.7*  MCV 95.2 94.3  --  92.6 90.8 89.9  PLT 129* 128*  --  137* 168 195    INR: Recent Labs  Lab 09/27/22 0415 09/28/22 0505 09/28/22 1231 09/29/22 0356 09/30/22 0417  INR 1.8* 5.5* 6.7* 4.6* 3.5*    Other results: EKG:    Imaging   DG CHEST PORT 1 VIEW  Result Date: 09/29/2022 CLINICAL DATA:  Provided history: Congestive cardiac failure. EXAM: PORTABLE CHEST 1 VIEW COMPARISON:  Chest radiographs 09/28/2022 and FINDINGS: Right IJ approach introducer sheath with Swan-Ganz catheter no longer present. Chest tube/mediastinal drains. Left ventricular cyst device. Cardiomegaly. Aortic atherosclerosis. Central pulmonary vascular congestion without overt pulmonary edema. Persistent small bilateral pleural effusions. Additional ill-defined opacity at the right lung base, favored to reflect atelectasis. No evidence of pneumothorax. No acute bony abnormality identified. IMPRESSION: 1. Swan-Ganz catheter no longer present. A right IJ introducer sheath remains in place. 2.  Remaining support apparatus as described, and otherwise unchanged. 3. Otherwise unchanged appearance of the chest. Cardiomegaly. Central pulmonary vascular congestion without overt pulmonary edema. Small bilateral pleural effusions. Ill-defined opacity at the right lung base, likely reflecting atelectasis. 4. Aortic Atherosclerosis (ICD10-I70.0). Electronically Signed   By: Kellie Simmering D.O.   On: 09/29/2022 08:22   DG CHEST PORT 1 VIEW  Result Date: 09/28/2022 CLINICAL DATA:  Shortness of breath. EXAM: PORTABLE CHEST 1 VIEW COMPARISON:  Prior chest radiographs 09/27/2022 and earlier. FINDINGS: Prior median sternotomy. Cardiomegaly. Left ventricular assist device. Aortic atherosclerosis. Right IJ approach introducer sheath with Swan-Ganz catheter in the expected location of the right pulmonary artery. Chest tubes/mediastinal drains. Central pulmonary vascular congestion without overt pulmonary edema. Small bilateral pleural effusions (greater on the right). Additional opacity at the right lung base which may reflect atelectasis. However, pneumonia cannot be excluded. No evidence of pneumothorax. IMPRESSION: 1. Support apparatus as described. 2. Cardiomegaly 3. Central pulmonary vascular congestion without overt pulmonary edema. 4. Small bilateral pleural effusions (greater on the right), similar to the prior examination of 09/27/2022. Persistent opacity within the right lung base. This may reflect atelectasis. However, correlate clinically to exclude signs/symptoms that would suggest pneumonia. 5. Aortic Atherosclerosis (ICD10-I70.0). Electronically Signed   By: Kellie Simmering D.O.   On: 09/28/2022 11:37     Medications:     Scheduled Medications:  (feeding supplement) PROSource Plus  30 mL Oral BID WC   amiodarone  200 mg Oral BID   atorvastatin  40 mg Oral Daily   Chlorhexidine Gluconate Cloth  6 each Topical Daily   digoxin  0.0625 mg Oral Daily   docusate sodium  200 mg Oral BID   empagliflozin   10 mg Oral Daily   feeding supplement  237 mL Oral TID BM   insulin aspart  0-24 Units Subcutaneous Q4H   metoCLOPramide (REGLAN) injection  5 mg Intravenous Q8H   pantoprazole  40 mg Oral Daily   polyethylene glycol  17 g Oral Daily   scopolamine  1 patch Transdermal Q72H   senna  1 tablet Oral Daily   sodium chloride flush  10-40 mL Intracatheter Q12H   sodium chloride flush  3 mL Intravenous Q12H   Warfarin - Physician Dosing Inpatient   Does not apply q1600    Infusions:  sodium chloride     sodium chloride     sodium chloride 10 mL/hr at 09/30/22  [id number]   epinephrine Stopped (09/29/22 1201)   furosemide (LASIX) 200 mg in dextrose 5 % 100 mL (2 mg/mL) infusion 4 mg/hr (09/30/22 0500)   magnesium sulfate bolus IVPB     milrinone 0.25 mcg/kg/min (09/30/22 0500)   promethazine (PHENERGAN) injection (IM or IVPB) Stopped (09/29/22 1413)   vasopressin Stopped (09/27/22 1207)    PRN Medications: Place/Maintain arterial line **AND** sodium chloride, sodium chloride, acetaminophen, albuterol, dextrose, morphine injection, ondansetron (ZOFRAN) IV, mouth rinse, oxyCODONE, promethazine (PHENERGAN) injection (IM or IVPB), sodium chloride flush, sodium chloride flush, traMADol, traZODone   Patient Profile  72 y/o male w/ long standing h/o chronic systolic heart failure due to nonischemic cardiomyopathy, T2DM and hx of acute promyelocytic leukemia s/p ATRA ([facility] 2014). Recent development of rapidly progressive heart failure,  NYHA class IIIb-IV symptoms. Admitted for for RHC and optimization prior to VAD implant.   RHC w/ elevated filling pressures and low output (RA 13, PAP 64/26 (40), PCWP 23, CI 2 L/min/m2) =>started on Milrinone  2/14 S/P HMIII VAD   Assessment/Plan:    1. Chronic HFrEF, NICM -> 09/24/22 S/P HMIII LVAD:  Currently on milrinone 0.25--> cut back to 0.125 mcg.  - CVP lower. Continue lasix drip today.  Add 12.5 mg spiro  daily. Supp K.  - INR 3.5 today; will hold warfarin  dosing today.  2.Acute blood loss anemia: hgb stable at 9.1   3. NSVT/PVCS: Rare PVCs.. Cut back amio to 200 mg once daily.  4.  Hx of promyelocytic leukemia: S/P ATRA in 2014 at [facility].  5.  Hx of endocarditis: 2015 6.  Nausea: Has had BMs.  Still with nausea. Cut back reglan .  - Advance diet. Ensure added.   Remove foley at 11pm   PT following.   I reviewed the LVAD parameters from today, and compared the results to the patient's prior recorded data.  No programming changes were made.  The LVAD is functioning within specified parameters.  The patient performs LVAD self-test daily.  LVAD interrogation was negative for any significant power changes, alarms or PI events/speed drops.  LVAD equipment check completed and is in good working order.  Back-up equipment present.   LVAD education done on emergency procedures and precautions and reviewed exit site care.  Length of Stay: [place], NP 09/30/2022, 7:30 AM  VAD Team --- VAD ISSUES ONLY--- Pager [phone] (7am - 7am)  Advanced Heart Failure Team  Pager [phone] (M-F; 7a - 5p)  Please contact [facility] for night-coverage after hours (5p -7a ) and weekends on [email or web address]  Patient seen with NP, agree with the above note.    Good diuresis yesterday, I/Os net negative 2338 on Lasix 4 mg/hr.  Now off epinephrine, remains on milrinone 0.25.  Co-ox 79.5%. CVP 8-9.   Less nauseated today.  Eating a little.    General: Well appearing this am. NAD.  HEENT: Normal. Neck: Supple, JVP 8-9 cm. Carotids OK.  Cardiac:  Mechanical heart sounds with LVAD hum present.  Lungs:  CTAB, normal effort.  Abdomen:  NT, ND, no HSM. No bruits or masses. +BS  LVAD exit site: Well-healed and incorporated. Dressing dry and intact. No erythema or drainage. Stabilization device present and accurately applied. Driveline dressing changed daily per sterile technique. Extremities:  Warm and dry. No cyanosis, clubbing, rash. 1+ ankle edema.  Neuro:  Alert &  oriented x 3. Cranial nerves grossly intact. Moves all 4 extremities w/o difficulty. Affect pleasant    Decrease milrinone to  0.125 today.  Continue current Lasix 4 mg/hr for at least 1 more day and replace K.  Will add spironolactone to 12.5 daily.   Remains in NSR, decrease amiodarone to 200 mg daily.   Advance diet to regular.   Mobilize, walk in hall today. Aim to get foley out.   CRITICAL CARE Performed by: Loralie Champagne  Total critical care time: 35 minutes  Critical care time was exclusive of separately billable procedures and treating other patients.  Critical care was necessary to treat or prevent imminent or life-threatening deterioration.  Critical care was time spent personally by me on the following activities: development of treatment plan with patient and/or surrogate as well as nursing, discussions with consultants, evaluation of patient's response to treatment, examination of patient, obtaining history from patient or surrogate, ordering and performing treatments and interventions, ordering and review of laboratory studies, ordering and review of radiographic studies, pulse oximetry and re-evaluation of patient's condition.  Loralie Champagne 09/30/2022 7:56 AM

## 2022-09-30 NOTE — Progress Notes (Signed)
TCTS Progress Note: 6 Days Post-Op Procedure(s) (LRB): INSERTION OF IMPLANTABLE LEFT VENTRICULAR ASSIST DEVICE AND AORTIC VALVE CLOSURE (N/A) TRANSESOPHAGEAL ECHOCARDIOGRAM (N/A)  LOS: 8 days   Daily plan at bedside No coumadin Keep Cts in  Diet as can tolerate Mil to 0.125 Consider milr off tomorrow and TTE     Latest Ref Rng & Units 09/30/2022    4:17 AM 09/29/2022    3:56 AM 09/28/2022    5:05 AM  CBC  WBC 4.0 - 10.5 K/uL 6.4  7.9  8.8   Hemoglobin 13.0 - 17.0 g/dL 9.1  8.5  8.8   Hematocrit 39.0 - 52.0 % 26.7  24.7  26.2   Platelets 150 - 400 K/uL 195  168  137        Latest Ref Rng & Units 09/30/2022    4:17 AM 09/29/2022    7:43 AM 09/28/2022   11:29 AM  CMP  Glucose 70 - 99 mg/dL 87  104    BUN 8 - 23 mg/dL 14  21    Creatinine 0.61 - 1.24 mg/dL 1.02  1.17    Sodium 135 - 145 mmol/L 134  133    Potassium 3.5 - 5.1 mmol/L 3.3  3.6    Chloride 98 - 111 mmol/L 94  96    CO2 22 - 32 mmol/L 28  26    Calcium 8.9 - 10.3 mg/dL 8.3  8.2    Total Protein 6.5 - 8.1 g/dL   6.1   Total Bilirubin 0.3 - 1.2 mg/dL   0.6   Alkaline Phos 38 - 126 U/L   57   AST 15 - 41 U/L   28   ALT 0 - 44 U/L   12     ABG    Component Value Date/Time   PHART 7.312 (L) 09/27/2022 0546   PCO2ART 45.6 09/27/2022 0546   PO2ART 78 (L) 09/27/2022 0546   HCO3 23.1 09/27/2022 0546   TCO2 24 09/27/2022 0546   ACIDBASEDEF 3.0 (H) 09/27/2022 0546   O2SAT 79.5 09/30/2022 0417

## 2022-09-30 NOTE — Progress Notes (Signed)
CSW met briefly with patient at bedside. He states he is doing well and was getting ready to get bathed and back to bed at the time of visit. CSW met with his wife in the waiting area who shared she is doing well. She states that patient ambulated half way around the unit today and was pleased with his progress. She states she is hopeful for further improvement when the chest tubes are removed. Wife very attentive to patient and appears to be feeling positive about recovery so far. CSW will continue to follow and provide supportive needs. Raquel Sarna, [place], [place]

## 2022-09-30 NOTE — Progress Notes (Signed)
Brief Nutrition Follow-up:  RD noted diet advanced to Regular today.  Only one bout of significant nausea today, otherwise improved.   Pt ate some mac and cheese and apples at lunch today. Pt is drinking the Ensure supplement and reports he likes this equally to the Glucerna.   +BM yesterday  Labs reviewed; phosphorus now wdl. Sodium 134, potassium 3.3, CBGs 73-193 Meds: lasix gtt, KCl  Interventions:  1) Continue Regular diet 2) Continue Ensure Enlive po TID, each supplement provides 350 kcal and 20 grams of protein. 3) Continue 30 ml ProSource Plus BID, each supplement provides 100 kcals and 15 grams protein.

## 2022-09-30 NOTE — TOC Progression Note (Signed)
Transition of Care [facility]) - Progression Note    Patient Details  Name: Ricardo Zuniga MRN: [id number] Date of Birth: [birth year]  [facility] CM/SW Contact  Erenest Rasher, RN Phone Number: [phone] 09/30/2022, 11:34 AM  Clinical Narrative:     Spoke to pt and wife at bedside. Offered choice for [facility], provided wife with Medicare.gov list (placed list on chart with ratings). Wife states she will review. Will follow up with arranging need DME closer to dc. Will need HHPT orders with [facility].   Expected Discharge Plan: [place] Barriers to Discharge: Continued Medical Work up  Expected Discharge Plan and Services   Discharge Planning Services: CM Consult Post Acute Care Choice: [facility] arrangements for the past 2 months: Single Family Home                    Social Determinants of Health (SDOH) Interventions SDOH Screenings   Food Insecurity: No Food Insecurity (09/01/2022)  Housing: Low Risk  (09/01/2022)  Transportation Needs: No Transportation Needs (09/01/2022)  Utilities: Not At Risk (09/01/2022)  Depression (PHQ2-9): Low Risk  (09/03/2022)  Financial Resource Strain: Low Risk  (09/03/2022)  Tobacco Use: Medium Risk (09/25/2022)    Readmission Risk Interventions    11/01/2021    3:26 PM  Readmission Risk Prevention Plan  Transportation Screening Complete  HRI or [place] Complete  Social Work Consult for [place] Planning/Counseling Complete  Palliative Care Screening Not Applicable  Medication Review [employer] photographer) Complete

## 2022-09-30 NOTE — Progress Notes (Signed)
Physical Therapy Treatment Patient Details Name: BENJIMIN STROUGH MRN: [id number] DOB: 07-06-1951 Today's Date: 09/30/2022   History of Present Illness 72 yo M admitted 09/22/22  for RHC and optimization prior to LVAD HM3 implantation 09/24/22. 2/15 extubated. PMH: HFrEF, HTN, HLD, GERD, leukemia, NICM due to chemotherapy, multiple back surgeries, neck fusion, DM, neuropathy, shoulder surgery x 3.    PT Comments    Pt pleasant and eager to progress mobility this session with transition of power sources to battery and donning holster. Pt performing power transition with min cues and able to walk in hall and perform HEP. Pt educated for precautions and mobility progression with wife able to assist as needed. Will continue to follow.   SPO2 92% on 1L HR 102-105 Speed 5300, PI 6.8, power 3.9, flow 3.0    Recommendations for follow up therapy are one component of a multi-disciplinary discharge planning process, led by the attending physician.  Recommendations may be updated based on patient status, additional functional criteria and insurance authorization.  Follow Up Recommendations  Home health PT     Assistance Recommended at Discharge Intermittent Supervision/Assistance  Patient can return home with the following Help with stairs or ramp for entrance;Assist for transportation;Assistance with cooking/housework;A little help with walking and/or transfers;A little help with bathing/dressing/bathroom   Equipment Recommendations  Rollator (4 wheels);BSC/3in1    Recommendations for Other Services       Precautions / Restrictions Precautions Precautions: Sternal;Fall Precaution Comments: LVAD, chest tubes Restrictions Weight Bearing Restrictions: (P) Yes     Mobility  Bed Mobility               General bed mobility comments: pt in chair on arrival and end of session    Transfers Overall transfer level: Needs assistance   Transfers: Sit to/from Stand Sit to Stand: Min  assist           General transfer comment: min assist to rise and cues for hand placement to maintain sternal precautions, assist for lines and safety with very limited tactile assist to rise    Ambulation/Gait Ambulation/Gait assistance: Min assist, +2 safety/equipment Gait Distance (Feet): 150 Feet Assistive device: Rolling walker (2 wheels) Gait Pattern/deviations: Decreased stride length, Step-through pattern   Gait velocity interpretation: 1.31 - 2.62 ft/sec, indicative of limited community ambulator   General Gait Details: Pt with assist to direct RW, chair follow and physical assist for lines. Pt on 1L throughout gait with good tolerance, fatigued end of session   Stairs             Wheelchair Mobility    Modified Rankin (Stroke Patients Only)       Balance Overall balance assessment: Mild deficits observed, not formally tested                                          Cognition Arousal/Alertness: Awake/alert Behavior During Therapy: WFL for tasks assessed/performed Overall Cognitive Status: Impaired/Different from baseline Area of Impairment: Memory                     Memory: Decreased recall of precautions         General Comments: decreased recall of precautions and sequence for LVAd power change with repetition of education and cues during session        Exercises General Exercises - Lower Extremity Long Arc Quad: AROM, Both, 15  reps, Seated Hip Flexion/Marching: AROM, Both, 15 reps, Seated    General Comments        Pertinent Vitals/Pain Pain Assessment Pain Assessment: No/denies pain    Home Living                          Prior Function            PT Goals (current goals can now be found in the care plan section) Progress towards PT goals: Progressing toward goals    Frequency    Min 3X/week      PT Plan Current plan remains appropriate    Co-evaluation               AM-PAC PT "6 Clicks" Mobility   Outcome Measure  Help needed turning from your back to your side while in a flat bed without using bedrails?: A Little Help needed moving from lying on your back to sitting on the side of a flat bed without using bedrails?: A Little Help needed moving to and from a bed to a chair (including a wheelchair)?: A Little Help needed standing up from a chair using your arms (e.g., wheelchair or bedside chair)?: A Little Help needed to walk in hospital room?: A Lot Help needed climbing 3-5 steps with a railing? : Total 6 Click Score: 15    End of Session Equipment Utilized During Treatment: Oxygen;Gait belt Activity Tolerance: Patient tolerated treatment well Patient left: with call bell/phone within reach;with nursing/sitter in room;in chair Nurse Communication: Mobility status PT Visit Diagnosis: Other abnormalities of gait and mobility (R26.89);Muscle weakness (generalized) (M62.81);Pain     Time: [id number] PT Time Calculation (min) (ACUTE ONLY): 24 min  Charges:  $Gait Training: 8-22 mins $Therapeutic Activity: 8-22 mins                     Bayard Males, PT Acute Rehabilitation Services Office: [place] 09/30/2022, 10:46 AM

## 2022-09-30 NOTE — Progress Notes (Signed)
NAME:  Ricardo Zuniga, MRN:  [id number], DOB:  25-Sep-1950, LOS: 8 ADMISSION DATE:  09/22/2022, CONSULTATION DATE:  09/24/2022 REFERRING MD:  Enter (CT surgery), CHIEF COMPLAINT: Vent management s/p LVAD implantation  History of Present Illness:  The patient is a 72 yo M with PMH of HFrEF (possibly d/t endocarditis), T2DM, APML s/p ATRA in 2014 who just underwent LVAD implantation on 2/14.  Was previously admitted in 05/2022 for orthopnea, PND, and LE edema and echo showed LVEF of 30-35%.  Heart cath showed CAD and patient was discharged on GDMT.  Repeat echo 1/24 showed EF 25%, severe LV dilation, global hypokinesis, moderate RV dysfunction, mild RV enlargement, and mild-to moderate MR; rapid deterioration from previous health.  LVAD implantation was urgently planned.   Procedure was successful and patient transferred to ICU 2/14 PM, sedated and intubated.  Did require Glide for difficult airway.  Received rifampin, vancomycin, and cefazolin for preoperative prophylaxis.  Transferred to ICU on pressors, NO, and milrinone, weaning off ventilator and NO.  Extubated morning 2/15 and working on recovery since.  Pertinent  Medical History   Diagnosis   APML (acute promyelocytic leukemia) in remission ([place])    Completed treatment 04/2013   HFrEF    EF 25% on 09/03/2022   Coronary artery disease    Nonobstructive at cardiac catheterization 09/2018   Gastroesophageal reflux disease   Hypercholesteremia   Neuropathy    bilateral feet   Nonischemic cardiomyopathy ([place])    a. EF 20% in 2015 - thought to possibly be viral induced or chemotherapy induced from treatments in 2014 b. at 35-40% by echo in 05/2016    Peptic ulcer disease   Type 2 diabetes mellitus ([place])   Vertigo    tx with valium prn   Significant Hospital Events: Including procedures, antibiotic start and stop dates in addition to other pertinent events   2/12 - Admitted for RHC and optimization 2/14 - LVAD implantation, transferred  to ICU 2/15 - Extubated, progressive ambulation and diet 2/16 - Nausea, still no BM, clear liquid diet as tolerated.  Interim History / Subjective:  Nausea overnight, slightly better this morning.  Objective   Blood pressure (!) 111/96, pulse 90, temperature 97.9 F (36.6 C), resp. rate 18, height 5' 9"$  (1.753 m), weight 86.4 kg, SpO2 96 %. CVP:  [2 mmHg-14 mmHg] 11 mmHg      Intake/Output Summary (Last 24 hours) at 09/30/2022 0648 Last data filed at 09/30/2022 0500 Gross per 24 hour  Intake 1326.03 ml  Output 3390 ml  Net -2063.97 ml    Filed Weights   09/27/22 0400 09/28/22 0500 09/29/22 0441  Weight: 90.7 kg 87.6 kg 86.4 kg   Examination: General: ill appearing man sitting up in the recliner in NAD HEENT: [place]/AT, eyes anicteric Lungs:  breathing comfortably on [place], reduced basilar breath sounds Cardiovascular: hum on VAD, minimal bloody output from chest tubes Abdomen:  soft, NT GU: foley with clear yellow urine Extremities: edema in feet Skin: warm, dry, no diffuse rashes Neuro: awake, alert, moving all extremities. Answering questions appropriately.  Sodium 134 K+ 3.3 BUN 14 Creatinine 1.02 LDH 213 WBC 6.4 H/H 9.1/26.7 Platelets 195 Co. oximetry 80% INR 3.5   BG 70-190s   Assessment & Plan:  Acute on chronic HFrEF with cardiogenic shock s/p LVAD implantation Postoperative expected vasoplegic shock- resolved -con't milrinone -serial cooximetry -pain control per protocol- oxycodone, tramadol, morphine PRN; not requiring much  -con't digoxin, farxiga -warfarin dosing per pharmacy -lasix + electrolyte  repletion as needed. Adding spironolactone. -reduce amiodarone to once daily dosing -warfarin dosing per pharmacy -progress mobility -serial LDH monitoring  Supratherapeutic INR- resolved -warfarin dosing per pharmacy -goal INR 2.5-3.5  Nausea, poor appetite postop Constipation due to medications --reglan; reduce dose to 55m -con't scopolamine  patch -zoftan, phenergan PRN -con't bowel regimen -encourage PO intake as tolerated  T2DM, A1c 7.0  -con't jardiance -SSI PRN -goal BG <180  HLD -statin  Hypokalemia Hypophosphatemia , resolved -replete electrolytes, con't to monitor -add spironolactone  Acute blood loss anemia, expected operative blood loss Consumptive thrombocytopenia - warfarin dosing per pharmacy -monitor for bleeding -no current indication for transfusion  Hypervolemic hyponatremia -lasix gtt, con't to monitor  AKI, likely congestive; resolved -con't diuresis -strict I/O -renally dose meds, avoid nephrotoxic meds   Best Practice (right click and "Reselect all SmartList Selections" daily)   Diet/type: Regular consistency (see orders) DVT prophylaxis: Coumadin GI prophylaxis: N/A Lines: Central line and Arterial Line Foley:  removal ordered  Code Status:  full code Last date of multidisciplinary goals of care discussion: per primary   This patient is critically ill with multiple organ system failure which requires frequent high complexity decision making, assessment, support, evaluation, and titration of therapies. This was completed through the application of advanced monitoring technologies and extensive interpretation of multiple databases. During this encounter critical care time was devoted to patient care services described in this note for 33 minutes.  LJulian Hy DO 09/30/22 9:23 AM [facility]  For contact information, see Amion. If no response to pager, please call PCCM consult pager. After hours, 7PM- 7AM, please call Elink.

## 2022-09-30 NOTE — Progress Notes (Signed)
LVAD Coordinator Rounding Note:  Admitted 09/22/22 to Dr. Conception Chancy service for [place] and optimization prior to VAD implant.   HeartMate III LVAD implanted on 09/25/22 by Dr.Enter under DT criteria.   Patient awake and alert this morning sitting up in the chair. Pt states that his nausea is a little better this morning. He has several meds on board to help with nausea.   CVP 9 this morning.  Lasix gtt at 4 mg/hr. Plan to leave chest tubes till INR below 2.5 per MD. Per MD will increase Milrinone to 0.125 mcg/kg/min today. INR 3.5 today. Foley to be d/c at 11p tonight.  Diet advanced to Regular.   Vital signs: Temp: 98.6  HR:  90 Automatic BP: 87/70 (78) Doppler MAP: 88 O2 Sat: 100 on RA Wt: 194>195.7>199.9>193.2>190.4>185.6lbs  LVAD interrogation reveals:  Speed:  5300 Flow: 3.9 Power:3.8w PI: 3.3 Alarms: none  Events:1 PI in last 24 hrs Fixed speed:  5300 Low speed limit: 5000 HCT: 26  Drive Line: CDI. 3 sutures intact. Drive line anchor re-applied. Pt denies fever or chills. Continue every other day dressing changes.Anchor secure. Next dressing change due 10/01/22 by VAD Coordinator or nurse champion.    Labs:  Hgb:  9.7>9.6>8.5>9.1  WBC:  10.8>15.5>7.9>6.4  LDH trend:  284>338>245>213  INR trend:  1.6>1.7>1.8>5.5>4.6>3.5  Co-ox: 74.2>68.7>75.3>68.4>74>79.5  Creatinine: 1.4>0.92>1.41>1.49>1.27>1.17>1.02  LDH: 284>338>268>249>245>213  Intra-op blood products: 4 FFP Cell Saver 650 mL  ICU Blood Products: 09/25/22 1 PRBC 09/28/22 1 FFP  Arrhythmias:   Gtts: Epinephrine: 1 mcg/min-off 09/29/22 Vasopressin: 0.04 units/min stopped 09/27/22 Milrinone: 0.125 mcg/kg/min Lasix 4 mg/hr  Ventilator Status:  Extubated 09/25/22  Nitric oxide:  Weaned off 09/25/22  Anticoagulation Plan: INR Goal: 2-2.5 ASA dose:361m>> Aspirin 830mon hold Coumadin on hold tonight due to INR of 3.5  Adverse Events on VAD:   Patient Education: Patient's wife VaLorriane Shireerformed  dressing change x 1.   Plan/Recommendations:  1. Page VAD coordinator for any issues with VAD equipment or driveline. 2. Every other day dressing changes by VAD coordinator or nurse champion.   SaTanda RockersN, BSN VAD Coordinator 24/7 Pager [phone]

## 2022-10-01 ENCOUNTER — Inpatient Hospital Stay (HOSPITAL_COMMUNITY): Payer: Medicare PPO

## 2022-10-01 DIAGNOSIS — Z95811 Presence of heart assist device: Secondary | ICD-10-CM

## 2022-10-01 DIAGNOSIS — J9601 Acute respiratory failure with hypoxia: Secondary | ICD-10-CM | POA: Diagnosis not present

## 2022-10-01 DIAGNOSIS — E871 Hypo-osmolality and hyponatremia: Secondary | ICD-10-CM | POA: Diagnosis not present

## 2022-10-01 DIAGNOSIS — R29898 Other symptoms and signs involving the musculoskeletal system: Secondary | ICD-10-CM | POA: Diagnosis not present

## 2022-10-01 DIAGNOSIS — I5023 Acute on chronic systolic (congestive) heart failure: Secondary | ICD-10-CM | POA: Diagnosis not present

## 2022-10-01 LAB — CBC WITH DIFFERENTIAL/PLATELET
Abs Immature Granulocytes: 0.05 10*3/uL (ref 0.00–0.07)
Basophils Absolute: 0 10*3/uL (ref 0.0–0.1)
Basophils Relative: 0 %
Eosinophils Absolute: 0.1 10*3/uL (ref 0.0–0.5)
Eosinophils Relative: 1 %
HCT: 28 % — ABNORMAL LOW (ref 39.0–52.0)
Hemoglobin: 9.1 g/dL — ABNORMAL LOW (ref 13.0–17.0)
Immature Granulocytes: 1 %
Lymphocytes Relative: 8 %
Lymphs Abs: 0.6 10*3/uL — ABNORMAL LOW (ref 0.7–4.0)
MCH: 30 pg (ref 26.0–34.0)
MCHC: 32.5 g/dL (ref 30.0–36.0)
MCV: 92.4 fL (ref 80.0–100.0)
Monocytes Absolute: 0.8 10*3/uL (ref 0.1–1.0)
Monocytes Relative: 11 %
Neutro Abs: 5.6 10*3/uL (ref 1.7–7.7)
Neutrophils Relative %: 79 %
Platelets: 229 10*3/uL (ref 150–400)
RBC: 3.03 MIL/uL — ABNORMAL LOW (ref 4.22–5.81)
RDW: 15.9 % — ABNORMAL HIGH (ref 11.5–15.5)
WBC: 7.1 10*3/uL (ref 4.0–10.5)
nRBC: 0 % (ref 0.0–0.2)

## 2022-10-01 LAB — BASIC METABOLIC PANEL
Anion gap: 13 (ref 5–15)
BUN: 13 mg/dL (ref 8–23)
CO2: 29 mmol/L (ref 22–32)
Calcium: 8.4 mg/dL — ABNORMAL LOW (ref 8.9–10.3)
Chloride: 93 mmol/L — ABNORMAL LOW (ref 98–111)
Creatinine, Ser: 0.99 mg/dL (ref 0.61–1.24)
GFR, Estimated: >60 mL/min (ref >60)
Glucose, Bld: 127 mg/dL — ABNORMAL HIGH (ref 70–99)
Potassium: 3.6 mmol/L (ref 3.5–5.1)
Sodium: 135 mmol/L (ref 135–145)

## 2022-10-01 LAB — ECHOCARDIOGRAM LIMITED
Height: 69 in
Weight: 2980.62 oz

## 2022-10-01 LAB — LACTATE DEHYDROGENASE: LDH: 183 U/L (ref 98–192)

## 2022-10-01 LAB — PROTIME-INR
INR: 2.7 — ABNORMAL HIGH (ref 0.8–1.2)
Prothrombin Time: 28.4 seconds — ABNORMAL HIGH (ref 11.4–15.2)

## 2022-10-01 LAB — GLUCOSE, CAPILLARY
Glucose-Capillary: 105 mg/dL — ABNORMAL HIGH (ref 70–99)
Glucose-Capillary: 125 mg/dL — ABNORMAL HIGH (ref 70–99)
Glucose-Capillary: 135 mg/dL — ABNORMAL HIGH (ref 70–99)
Glucose-Capillary: 152 mg/dL — ABNORMAL HIGH (ref 70–99)
Glucose-Capillary: 164 mg/dL — ABNORMAL HIGH (ref 70–99)
Glucose-Capillary: 180 mg/dL — ABNORMAL HIGH (ref 70–99)

## 2022-10-01 LAB — PHOSPHORUS: Phosphorus: 2.8 mg/dL (ref 2.5–4.6)

## 2022-10-01 LAB — COOXEMETRY PANEL
Carboxyhemoglobin: 2.7 % — ABNORMAL HIGH (ref 0.5–1.5)
Methemoglobin: <0.7 % (ref 0.0–1.5)
O2 Saturation: 68.1 %
Total hemoglobin: 9.4 g/dL — ABNORMAL LOW (ref 12.0–16.0)

## 2022-10-01 LAB — BRAIN NATRIURETIC PEPTIDE: B Natriuretic Peptide: 969.5 pg/mL — ABNORMAL HIGH (ref 0.0–100.0)

## 2022-10-01 LAB — MAGNESIUM: Magnesium: 1.9 mg/dL (ref 1.7–2.4)

## 2022-10-01 MED ORDER — ASPIRIN 81 MG PO TBEC
81.0000 mg | DELAYED_RELEASE_TABLET | Freq: Every day | ORAL | Status: DC
  Administered 2022-10-01 – 2022-10-07 (×7): 81 mg via ORAL
  Filled 2022-10-01 (×7): qty 1

## 2022-10-01 MED ORDER — POTASSIUM CHLORIDE CRYS ER 20 MEQ PO TBCR
40.0000 meq | EXTENDED_RELEASE_TABLET | Freq: Once | ORAL | Status: AC
  Administered 2022-10-01: 40 meq via ORAL
  Filled 2022-10-01: qty 2

## 2022-10-01 MED ORDER — SPIRONOLACTONE 25 MG PO TABS
25.0000 mg | ORAL_TABLET | Freq: Every day | ORAL | Status: DC
  Administered 2022-10-01 – 2022-10-07 (×7): 25 mg via ORAL
  Filled 2022-10-01 (×7): qty 1

## 2022-10-01 MED ORDER — FUROSEMIDE 40 MG PO TABS: 40.0000 mg | ORAL_TABLET | Freq: Two times a day (BID) | ORAL | Status: DC

## 2022-10-01 MED ORDER — FUROSEMIDE 10 MG/ML IJ SOLN
40.0000 mg | Freq: Two times a day (BID) | INTRAMUSCULAR | Status: DC
  Administered 2022-10-01 – 2022-10-05 (×9): 40 mg via INTRAVENOUS
  Filled 2022-10-01 (×9): qty 4

## 2022-10-01 MED FILL — Potassium Chloride Inj 2 mEq/ML: INTRAVENOUS | Qty: 40 | Status: AC

## 2022-10-01 MED FILL — Magnesium Sulfate Inj 50%: INTRAMUSCULAR | Qty: 10 | Status: AC

## 2022-10-01 MED FILL — Heparin Sodium (Porcine) Inj 1000 Unit/ML: Qty: 1000 | Status: AC

## 2022-10-01 NOTE — Progress Notes (Signed)
Plan is to Add ASA back in today.   From my perspective good for full dose.   Discussed with pharmacy as well

## 2022-10-01 NOTE — Progress Notes (Signed)
CSW met at bedside with patient and wife. Patient reports he continues to feel nauseous but slightly improved from yesterday. Wife reports she is doing well and visits daily. CSW provided support and will continue to follow throughout implant hospitalization. Raquel Sarna, [place], [place]

## 2022-10-01 NOTE — Progress Notes (Signed)
LVAD Coordinator Rounding Note:  Admitted 09/22/22 to Dr. Conception Chancy service for [place] and optimization prior to VAD implant.   HeartMate III LVAD implanted on 09/25/22 by Dr.Enter under DT criteria.   Patient awake and alert this morning sitting up in the chair. Pt states that his nausea is improved this morning. He ate most of his breakfast tray. Reports he walked in the hallway this morning.   CVP 10 this morning.  Lasix gtt at 4 mg/hr- per MD will stop drip and start Lasix 40 mg BID PO. INR 2.7 today- will hold Coumadin again tonight. Plan to leave chest tubes till INR below 2.3 per Dr Tenny Craw.   On Milrinone to 0.125 mcg/kg/min. Will obtain limited echo to assess for readiness of Milrinone discontinuation per MD. See separate note for documentation.    Vital signs: Temp: 98.4  HR: 97 Automatic BP: 81/68 (74) Doppler MAP: 80 O2 Sat: 94% on RA Wt: 194>195.7>199.9>193.2>190.4>185.6>186.3 lbs  LVAD interrogation reveals:  Speed:  5300 Flow: 3.7 Power: 3.8 w PI: 4.6  Alarms: none  Events: none  Fixed speed:  5300 Low speed limit: 5000 HCT: 28  Drive Line:  Dressing change completed by Terie Purser RN under VAD coordinator observation. Existing VAD dressing removed and site care performed using sterile technique. Drive line exit site cleaned with Chlora prep applicators x 2, allowed to dry, and Sorbaview dressing with Silver strip applied. Exit site healing and unincorporated, the velour is fully implanted at exit site. No redness, tenderness, drainage, foul odor or rash noted. 3 sutures intact- removed today for ease of exit site care and proper anchoring. Drive line anchor repositioned and re-applied. Pt denies fever or chills. Continue every other day dressing changes. Next dressing change due 10/03/22 by VAD Coordinator or nurse champion.  Labs:  Hgb:  9.7>9.6>8.5>9.1>9.1  WBC:  10.8>15.5>7.9>6.4>7.1  LDH trend:  284>338>245>213>183  INR trend:   1.6>1.7>1.8>5.5>4.6>3.5>2.7  Co-ox: 74.2>68.7>75.3>68.4>74>79.5>68.1  Creatinine: 1.4>0.92>1.41>1.49>1.27>1.17>1.02>0.99  LDH: 284>338>268>249>245>213>183  Intra-op blood products: 4 FFP Cell Saver 650 mL  ICU Blood Products: 09/25/22 1 PRBC 09/28/22 1 FFP  Arrhythmias:   Gtts: Epinephrine: 1 mcg/min-off 09/29/22 Vasopressin: 0.04 units/min- stopped 09/27/22 Milrinone: 0.125 mcg/kg/min- stopped 10/01/22 Lasix 4 mg/hr- stopped 10/01/22  Ventilator Status:  Extubated 09/25/22  Nitric oxide:  Weaned off 09/25/22  Anticoagulation Plan: INR Goal: 2-2.5 ASA dose:343m>> Aspirin 843mon hold Coumadin on hold tonight due to INR of 2.7  Adverse Events on VAD:   Patient Education: Patient's wife VaLorriane Shireerformed dressing change x 1.   Plan/Recommendations:  1. Page VAD coordinator for any issues with VAD equipment or driveline. 2. Every other day dressing changes by VAD coordinator or nurse champion.   AlEmerson MonteN VASan Joseoordinator  Office: [phone] Pager: [phone]

## 2022-10-01 NOTE — Progress Notes (Signed)
TCTS Progress Note: 7 Days Post-Op Procedure(s) (LRB): INSERTION OF IMPLANTABLE LEFT VENTRICULAR ASSIST DEVICE AND AORTIC VALVE CLOSURE (N/A) TRANSESOPHAGEAL ECHOCARDIOGRAM (N/A)  LOS: 9 days   Plan is coumadin hold, CT out tomorrow,  TTE today - done and speed increased now flows of 4 Milrinone off  Ate 50-75% of food today  Flows of 4L     Latest Ref Rng & Units 10/01/2022    4:53 AM 09/30/2022    4:17 AM 09/29/2022    3:56 AM  CBC  WBC 4.0 - 10.5 K/uL 7.1  6.4  7.9   Hemoglobin 13.0 - 17.0 g/dL 9.1  9.1  8.5   Hematocrit 39.0 - 52.0 % 28.0  26.7  24.7   Platelets 150 - 400 K/uL 229  195  168        Latest Ref Rng & Units 10/01/2022    4:53 AM 09/30/2022    4:17 AM 09/29/2022    7:43 AM  CMP  Glucose 70 - 99 mg/dL 127  87  104   BUN 8 - 23 mg/dL 13  14  21   $ Creatinine 0.61 - 1.24 mg/dL 0.99  1.02  1.17   Sodium 135 - 145 mmol/L 135  134  133   Potassium 3.5 - 5.1 mmol/L 3.6  3.3  3.6   Chloride 98 - 111 mmol/L 93  94  96   CO2 22 - 32 mmol/L 29  28  26   $ Calcium 8.9 - 10.3 mg/dL 8.4  8.3  8.2     ABG    Component Value Date/Time   PHART 7.312 (L) 09/27/2022 0546   PCO2ART 45.6 09/27/2022 0546   PO2ART 78 (L) 09/27/2022 0546   HCO3 23.1 09/27/2022 0546   TCO2 24 09/27/2022 0546   ACIDBASEDEF 3.0 (H) 09/27/2022 0546   O2SAT 68.1 10/01/2022 0453

## 2022-10-01 NOTE — Progress Notes (Signed)
Occupational Therapy Treatment Patient Details Name: Ricardo Zuniga MRN: [id number] DOB: [birth year] Today's Date: 10/01/2022   History of present illness 72 yo M admitted 09/22/22  for RHC and optimization prior to LVAD HM3 implantation 09/24/22. 2/15 extubated. PMH: HFrEF, HTN, HLD, GERD, leukemia, NICM due to chemotherapy, multiple back surgeries, neck fusion, DM, neuropathy, shoulder surgery x 3.   OT comments  Pt is making solid progress towards their acute OT goals. Overall he required mod-max cues for LVAD management and to maintain sternal precautions throughout session with limited carryover noted. He needed min A physical assist for transfers and ambulated with [place] walking in the hallway with 1x sitting rest break due to dizziness with SOB . OT to continue to follow acutely. Discharge recommendation remains appropriate.    Recommendations for follow up therapy are one component of a multi-disciplinary discharge planning process, led by the attending physician.  Recommendations may be updated based on patient status, additional functional criteria and insurance authorization.    Follow Up Recommendations  Home health OT     Assistance Recommended at Discharge Intermittent Supervision/Assistance  Patient can return home with the following  Assist for transportation;Assistance with cooking/housework;A little help with bathing/dressing/bathroom;A little help with walking and/or transfers   Equipment Recommendations  BSC/3in1       Precautions / Restrictions Precautions Precautions: Sternal;Fall Precaution Comments: LVAD, chest tubes Restrictions Weight Bearing Restrictions: Yes RUE Weight Bearing: Non weight bearing LUE Weight Bearing: Non weight bearing       Mobility Bed Mobility Overal bed mobility: Needs Assistance Bed Mobility: Sit to Sidelying, Rolling Rolling: Min assist       Sit to sidelying: Min assist General bed mobility comments: cues needed     Transfers Overall transfer level: Needs assistance Equipment used: None Transfers: Sit to/from Stand Sit to Stand: Min assist           General transfer comment: holding heart pillow     Balance Overall balance assessment: Needs assistance Sitting-balance support: No upper extremity supported, Feet supported Sitting balance-Leahy Scale: Good     Standing balance support: Single extremity supported Standing balance-Leahy Scale: Fair                             ADL either performed or assessed with clinical judgement   ADL Overall ADL's : Needs assistance/impaired                 Upper Body Dressing : Moderate assistance Upper Body Dressing Details (indicate cue type and reason): mod cues and assist needed to manage LVAD     Toilet Transfer: Minimal assistance;Ambulation (eva walker) Toilet Transfer Details (indicate cue type and reason): cues for hand placement and to power up         Functional mobility during ADLs: Minimal assistance;Cueing for sequencing;Cueing for safety General ADL Comments: mod-maximal cues needed throughout for LVAD management and sternal precautions    Extremity/Trunk Assessment Upper Extremity Assessment Upper Extremity Assessment: Generalized weakness   Lower Extremity Assessment Lower Extremity Assessment: Generalized weakness        Vision   Vision Assessment?: No apparent visual deficits Additional Comments: reading glasses   Perception Perception Perception: Within Functional Limits   Praxis Praxis Praxis: Intact    Cognition Arousal/Alertness: Awake/alert Behavior During Therapy: WFL for tasks assessed/performed Overall Cognitive Status: Impaired/Different from baseline Area of Impairment: Memory  Memory: Decreased recall of precautions         General Comments: decreased recall of precautions and sequence for LVAD power change and sternal precautions         Exercises      Shoulder Instructions       General Comments VSS on RA, pt reported some dizziness after ambulation    Pertinent Vitals/ Pain       Pain Assessment Pain Assessment: Faces Faces Pain Scale: Hurts a little bit Pain Location: chest Pain Descriptors / Indicators: Operative site guarding Pain Intervention(s): Limited activity within patient's tolerance, Monitored during session  Home Living                                          Prior Functioning/Environment              Frequency  Min 2X/week        Progress Toward Goals  OT Goals(current goals can now be found in the care plan section)  Progress towards OT goals: Progressing toward goals  Acute Rehab OT Goals Patient Stated Goal: to feel better OT Goal Formulation: With patient Time For Goal Achievement: 10/13/22 Potential to Achieve Goals: Good ADL Goals Pt Will Perform Grooming: with supervision;standing Pt Will Perform Lower Body Dressing: with supervision;sit to/from stand Pt Will Transfer to Toilet: with supervision;ambulating;regular height toilet  Plan Discharge plan remains appropriate    Co-evaluation                 AM-PAC OT "6 Clicks" Daily Activity     Outcome Measure   Help from another person eating meals?: None Help from another person taking care of personal grooming?: A Little Help from another person toileting, which includes using toliet, bedpan, or urinal?: A Lot Help from another person bathing (including washing, rinsing, drying)?: A Lot Help from another person to put on and taking off regular upper body clothing?: A Lot Help from another person to put on and taking off regular lower body clothing?: A Lot 6 Click Score: 15    End of Session Equipment Utilized During Treatment: Gait belt;Rolling walker (2 wheels)  OT Visit Diagnosis: Unsteadiness on feet (R26.81)   Activity Tolerance Patient tolerated treatment well   Patient Left in  bed;with call bell/phone within reach;with family/visitor present   Nurse Communication Mobility status        Time: [id number] OT Time Calculation (min): 38 min  Charges: OT General Charges $OT Visit: 1 Visit OT Treatments $Self Care/Home Management : 8-22 mins $Therapeutic Activity: 8-22 mins  Shade Flood, OTR/L Acute Rehabilitation Services Office [phone] Secure Chat Communication Preferred   Elliot Cousin 10/01/2022, 4:15 PM

## 2022-10-01 NOTE — Progress Notes (Signed)
Patient ID: Ricardo Zuniga, male   DOB: 1950-11-15, 72 y.o.   MRN: [id number]   Advanced Heart Failure VAD Team Note  PCP-Cardiologist: [name], MD   Subjective:    2/15 S/P HMIII LVAD.   Currently on Milrinone 0.125 mcg. CO-OX 68%   CVP 10.  I/Os net negative 1854 on Lasix gtt 4 mg/hr.    Less nausea, eating better, walked in hall.   LVAD INTERROGATION:  HeartMate III LVAD:   Flow 3.8 liters/min, speed 5300, power 3.7, PI 3.6.   LDH 213 => 183 INR 2.7  Objective:    Vital Signs:   Temp:  [97.9 F (36.6 C)-99.9 F (37.7 C)] 97.9 F (36.6 C) (02/21 0400) Pulse Rate:  [70-109] 104 (02/21 0700) Resp:  [14-27] 18 (02/21 0700) BP: (85-127)/(60-95) 114/93 (02/21 0700) SpO2:  [78 %-100 %] 78 % (02/21 0700) Weight:  [84.5 kg] 84.5 kg (02/21 0500) Last BM Date : 09/29/22 Mean arterial Pressure  80s-90s  Intake/Output:   Intake/Output Summary (Last 24 hours) at 10/01/2022 0728 Last data filed at 10/01/2022 0700 Gross per 24 hour  Intake 1071.21 ml  Output 2925 ml  Net -1853.79 ml     Physical Exam    General: Well appearing this am. NAD.  HEENT: Normal. Neck: Supple, JVP 10 cm. Carotids OK.  Cardiac:  Mechanical heart sounds with LVAD hum present.  Lungs:  CTAB, normal effort.  Abdomen:  NT, ND, no HSM. No bruits or masses. +BS  LVAD exit site: Well-healed and incorporated. Dressing dry and intact. No erythema or drainage. Stabilization device present and accurately applied. Driveline dressing changed daily per sterile technique. Extremities:  Warm and dry. No cyanosis, clubbing, rash. 1+ edema to knees.  Neuro:  Alert & oriented x 3. Cranial nerves grossly intact. Moves all 4 extremities w/o difficulty. Affect pleasant     Telemetry   SR 90s (personally reviewed).    EKG    N/A   Labs   Basic Metabolic Panel: Recent Labs  Lab 09/27/22 0415 09/27/22 0546 09/28/22 0505 09/29/22 0356 09/29/22 0743 09/30/22 0417 10/01/22 0453  NA 130* 130* 132*   --  133* 134* 135  K 5.0 5.0 3.6  --  3.6 3.3* 3.6  CL 96*  --  97*  --  96* 94* 93*  CO2 24  --  24  --  26 28 29  $ GLUCOSE 213*  --  141*  --  104* 87 127*  BUN 26*  --  27*  --  21 14 13  $ CREATININE 1.49*  --  1.27*  --  1.17 1.02 0.99  CALCIUM 8.4*  --  8.1*  --  8.2* 8.3* 8.4*  MG 2.3  --  2.0 2.0  --  1.8 1.9  PHOS 3.3  --  2.1* 1.9*  --  2.7 2.8    Liver Function Tests: Recent Labs  Lab 09/25/22 0258 09/26/22 0348 09/27/22 0415 09/28/22 1129  AST 54* 70* 39 28  ALT 20 20 17 12  $ ALKPHOS 43 49 58 57  BILITOT 1.9* 1.4* 0.5 0.6  PROT 6.1* 6.4* 6.2* 6.1*  ALBUMIN 3.9 3.8 3.5 3.1*   No results for input(s): "LIPASE", "AMYLASE" in the last 168 hours. No results for input(s): "AMMONIA" in the last 168 hours.  CBC: Recent Labs  Lab 09/27/22 0415 09/27/22 0546 09/28/22 0505 09/29/22 0356 09/30/22 0417 10/01/22 0453  WBC 14.8*  --  8.8 7.9 6.4 7.1  NEUTROABS 12.4*  --  7.3  6.7 5.1 5.6  HGB 9.1* 11.2* 8.8* 8.5* 9.1* 9.1*  HCT 27.9* 33.0* 26.2* 24.7* 26.7* 28.0*  MCV 94.3  --  92.6 90.8 89.9 92.4  PLT 128*  --  137* 168 195 229    INR: Recent Labs  Lab 09/28/22 0505 09/28/22 1231 09/29/22 0356 09/30/22 0417 10/01/22 0453  INR 5.5* 6.7* 4.6* 3.5* 2.7*    Other results: EKG:    Imaging   DG CHEST PORT 1 VIEW  Result Date: 09/29/2022 CLINICAL DATA:  Provided history: Congestive cardiac failure. EXAM: PORTABLE CHEST 1 VIEW COMPARISON:  Chest radiographs 09/28/2022 and FINDINGS: Right IJ approach introducer sheath with Swan-Ganz catheter no longer present. Chest tube/mediastinal drains. Left ventricular cyst device. Cardiomegaly. Aortic atherosclerosis. Central pulmonary vascular congestion without overt pulmonary edema. Persistent small bilateral pleural effusions. Additional ill-defined opacity at the right lung base, favored to reflect atelectasis. No evidence of pneumothorax. No acute bony abnormality identified. IMPRESSION: 1. Swan-Ganz catheter no longer  present. A right IJ introducer sheath remains in place. 2. Remaining support apparatus as described, and otherwise unchanged. 3. Otherwise unchanged appearance of the chest. Cardiomegaly. Central pulmonary vascular congestion without overt pulmonary edema. Small bilateral pleural effusions. Ill-defined opacity at the right lung base, likely reflecting atelectasis. 4. Aortic Atherosclerosis (ICD10-I70.0). Electronically Signed   By: Kellie Simmering D.O.   On: 09/29/2022 08:22     Medications:     Scheduled Medications:  (feeding supplement) PROSource Plus  30 mL Oral BID WC   amiodarone  200 mg Oral Daily   atorvastatin  40 mg Oral Daily   Chlorhexidine Gluconate Cloth  6 each Topical Daily   digoxin  0.0625 mg Oral Daily   docusate sodium  200 mg Oral BID   empagliflozin  10 mg Oral Daily   feeding supplement  237 mL Oral TID BM   furosemide  40 mg Oral BID   insulin aspart  0-24 Units Subcutaneous Q4H   metoCLOPramide (REGLAN) injection  5 mg Intravenous Q8H   pantoprazole  40 mg Oral Daily   polyethylene glycol  17 g Oral Daily   scopolamine  1 patch Transdermal Q72H   senna  1 tablet Oral Daily   sodium chloride flush  10-40 mL Intracatheter Q12H   sodium chloride flush  3 mL Intravenous Q12H   spironolactone  12.5 mg Oral Daily   Warfarin - Physician Dosing Inpatient   Does not apply q1600    Infusions:  sodium chloride     sodium chloride     sodium chloride Stopped (09/30/22 1155)   epinephrine Stopped (09/29/22 1201)   milrinone 0.125 mcg/kg/min (10/01/22 0700)   promethazine (PHENERGAN) injection (IM or IVPB) Stopped (09/30/22 2344)   vasopressin Stopped (09/27/22 1207)    PRN Medications: Place/Maintain arterial line **AND** sodium chloride, sodium chloride, acetaminophen, albuterol, dextrose, morphine injection, ondansetron (ZOFRAN) IV, mouth rinse, oxyCODONE, promethazine (PHENERGAN) injection (IM or IVPB), sodium chloride flush, sodium chloride flush, traMADol,  traZODone   Patient Profile  72 y/o male w/ long standing h/o chronic systolic heart failure due to nonischemic cardiomyopathy, T2DM and hx of acute promyelocytic leukemia s/p ATRA ([facility] 2014). Recent development of rapidly progressive heart failure,  NYHA class IIIb-IV symptoms. Admitted for for RHC and optimization prior to VAD implant.   RHC w/ elevated filling pressures and low output (RA 13, PAP 64/26 (40), PCWP 23, CI 2 L/min/m2) =>started on Milrinone  2/14 S/P HMIII VAD   Assessment/Plan:    1. Chronic HFrEF, NICM ->  09/24/22 S/P HMIII LVAD: Currently on milrinone 0.125. Flow stable at 3.8. Co-ox 68%.  CVP 10 on Lasix gtt 4 mg/hr.  INR drifting down to 2.7.  LDH 183.  - Can transition to Lasix 40 mg IV bid.  - Hold warfarin today, want INR closer to 2 to remove CTs. - Continue milrinone 0.125 for now, will get echo this morning to assess for speed increase.  - Increase spironolactone to 25 daily.  - Continue Jardiance.  2. Acute blood loss anemia: hgb stable at 9.1   3. NSVT/PVCS: Rare PVCs. - Amiodarone 200 daily.   4.  Hx of promyelocytic leukemia: S/P ATRA in 2014 at [facility].  5.  Hx of endocarditis: 2015 6.  Nausea/poor appetite: Improving.   Ambulate.  Ted hose for legs.   I reviewed the LVAD parameters from today, and compared the results to the patient's prior recorded data.  No programming changes were made.  The LVAD is functioning within specified parameters.  The patient performs LVAD self-test daily.  LVAD interrogation was negative for any significant power changes, alarms or PI events/speed drops.  LVAD equipment check completed and is in good working order.  Back-up equipment present.   LVAD education done on emergency procedures and precautions and reviewed exit site care.  CRITICAL CARE Performed by: Loralie Champagne  Total critical care time: 35 minutes  Critical care time was exclusive of separately billable procedures and treating other patients.  Critical  care was necessary to treat or prevent imminent or life-threatening deterioration.  Critical care was time spent personally by me on the following activities: development of treatment plan with patient and/or surrogate as well as nursing, discussions with consultants, evaluation of patient's response to treatment, examination of patient, obtaining history from patient or surrogate, ordering and performing treatments and interventions, ordering and review of laboratory studies, ordering and review of radiographic studies, pulse oximetry and re-evaluation of patient's condition.  Loralie Champagne 10/01/2022 7:28 AM

## 2022-10-01 NOTE — Progress Notes (Signed)
NAME:  JED MASSAQUOI, MRN:  [id number], DOB:  [birth year], LOS: 9 ADMISSION DATE:  09/22/2022, CONSULTATION DATE:  09/24/2022 REFERRING MD:  Enter (CT surgery), CHIEF COMPLAINT: Vent management s/p LVAD implantation  History of Present Illness:  The patient is a 72 yo M with PMH of HFrEF (possibly d/t endocarditis), T2DM, APML s/p ATRA in 2014 who just underwent LVAD implantation on 2/14.  Was previously admitted in 05/2022 for orthopnea, PND, and LE edema and echo showed LVEF of 30-35%.  Heart cath showed CAD and patient was discharged on GDMT.  Repeat echo 1/24 showed EF 25%, severe LV dilation, global hypokinesis, moderate RV dysfunction, mild RV enlargement, and mild-to moderate MR; rapid deterioration from previous health.  LVAD implantation was urgently planned.   Procedure was successful and patient transferred to ICU 2/14 PM, sedated and intubated.  Did require Glide for difficult airway.  Received rifampin, vancomycin, and cefazolin for preoperative prophylaxis.  Transferred to ICU on pressors, NO, and milrinone, weaning off ventilator and NO.  Extubated morning 2/15 and working on recovery since.  Pertinent  Medical History   Diagnosis   APML (acute promyelocytic leukemia) in remission ([place])    Completed treatment 04/2013   HFrEF    EF 25% on 09/03/2022   Coronary artery disease    Nonobstructive at cardiac catheterization 09/2018   Gastroesophageal reflux disease   Hypercholesteremia   Neuropathy    bilateral feet   Nonischemic cardiomyopathy ([place])    a. EF 20% in 2015 - thought to possibly be viral induced or chemotherapy induced from treatments in 2014 b. at 35-40% by echo in 05/2016    Peptic ulcer disease   Type 2 diabetes mellitus ([place])   Vertigo    tx with valium prn   Significant Hospital Events: Including procedures, antibiotic start and stop dates in addition to other pertinent events   2/12 - Admitted for RHC and optimization 2/14 - LVAD implantation, transferred  to ICU 2/15 - Extubated, progressive ambulation and diet 2/16 - Nausea, still no BM, clear liquid diet as tolerated.  Interim History / Subjective:  Still having nausea, but he ate most of his breakfast tray. Walked a lap in the hall today.  Objective   Blood pressure 95/83, pulse (!) 101, temperature 98.4 F (36.9 C), temperature source Oral, resp. rate 20, height 5' 9"$  (1.753 m), weight 84.5 kg, SpO2 100 %. CVP:  [6 mmHg-12 mmHg] 10 mmHg      Intake/Output Summary (Last 24 hours) at 10/01/2022 1157 Last data filed at 10/01/2022 1000 Gross per 24 hour  Intake 665.57 ml  Output 2640 ml  Net -1974.43 ml    Filed Weights   09/29/22 0441 09/30/22 0603 10/01/22 0500  Weight: 86.4 kg 84.2 kg 84.5 kg   Examination: General: ill appearing man sitting in the recliner in NAD HEENT: [place]/AT, eyes anicteric Lungs:  breathing comfortably on RA, CTAB Cardiovascular: hum of VAD, serosanguinous output from chest tubes Abdomen:  soft, NT Extremities: improving edema Skin: warm, dry, no rashes Neuro: awake, alert, moving all extremities, answering questions appropriately  Sodium 135 K+ 3.6 BUN 13 Creatinine 0.99 LDH 183 WBC 7.1 H/H 9.1/28 Platelets 229 BNP 969.5 Co. oximetry 68% INR 2.7   BG 120-190s    Resolved problem list:   AKI  Assessment & Plan:  Acute on chronic HFrEF with cardiogenic shock s/p LVAD implantation Postoperative expected vasoplegic shock- resolved -D/c milrinone  -Serial cooximeter - Pain control per protocol - Continue Farxiga, digoxin,  amiodarone, spironolactone - Warfarin dosing per pharmacy - Lasix twice daily -Progressive mobility  Supratherapeutic INR- resolved -warfarin dosing per pharmacy; holding doses today per Dr. Tenny Craw.  Once subtherapeutic INR for chest tube removal  Nausea, poor appetite postop Constipation due to medications -Continue Reglan at reduced dose.  Hopefully can stop in the coming days as nausea continues to improve -  Continue scopolamine patch - Zofran, Phenergan - Bowel regimen. - Encourage p.o. intake as tolerated.  Intake is improved significantly today.  Hyperglycemia, T2DM, A1c 7.0  -Continue Jardiance - Sliding scale insulin as needed -Goal blood glucose less than 180  HLD -Continue statin  Hypokalemia Hypophosphatemia , resolved -Continue spironolactone - Continue to monitor electrolytes and replete as needed  Acute blood loss anemia, expected operative blood loss Consumptive thrombocytopenia -Warfarin dosing per pharmacy, hold today - Monitor for bleeding - Hopefully getting chest tubes out soon.  Hypervolemic hyponatremia -Lasix twice daily  Deconditioning -PT, OT, progressive mobility.  Best Practice (right click and "Reselect all SmartList Selections" daily)   Diet/type: Regular consistency (see orders) DVT prophylaxis: Coumadin GI prophylaxis: N/A Lines: Central line and Arterial Line Foley:  removal ordered  Code Status:  full code Last date of multidisciplinary goals of care discussion: per primary   Julian Hy, DO 10/01/22 3:46 PM [facility]  For contact information, see Amion. If no response to pager, please call PCCM consult pager. After hours, 7PM- 7AM, please call Elink.

## 2022-10-01 NOTE — Progress Notes (Signed)
Speed  Flow  PI  Power  LVIDD  AI  Aortic opening MR  TR  Septum  RV  VTI (>18cm)  5300 4.0  3.4 3.8 5.4 none 5/5 (slight opening)  mild mild  Midline, slight bow left  mild 12.5  5400  4.1 3.5 3.9 5.0 none 5/5 (Slight opening) trivial mild Slight bounce left mild 11.1                                                           Doppler MAP:  Auto cuff BP: 95/83 (89)   Ramp ECHO performed at bedside per Dr [name]. Currently on Milrinone 0.125 mcg/kg/min.   At completion of ramp study, patients primary controller programmed:  Fixed speed: 5400 Low speed limit: 5100  At completion of RAMP Milrinone stopped per Dr [name]. Will plan to ramp echo again prior to discharge.    Emerson Monte RN [place] Coordinator  Office: [phone]  24/7 Pager: [phone]

## 2022-10-02 DIAGNOSIS — I5023 Acute on chronic systolic (congestive) heart failure: Secondary | ICD-10-CM | POA: Diagnosis not present

## 2022-10-02 LAB — CBC WITH DIFFERENTIAL/PLATELET
Abs Immature Granulocytes: 0.04 10*3/uL (ref 0.00–0.07)
Basophils Absolute: 0 10*3/uL (ref 0.0–0.1)
Basophils Relative: 0 %
Eosinophils Absolute: 0 10*3/uL (ref 0.0–0.5)
Eosinophils Relative: 0 %
HCT: 29.1 % — ABNORMAL LOW (ref 39.0–52.0)
Hemoglobin: 9.7 g/dL — ABNORMAL LOW (ref 13.0–17.0)
Immature Granulocytes: 0 %
Lymphocytes Relative: 4 %
Lymphs Abs: 0.4 10*3/uL — ABNORMAL LOW (ref 0.7–4.0)
MCH: 30.6 pg (ref 26.0–34.0)
MCHC: 33.3 g/dL (ref 30.0–36.0)
MCV: 91.8 fL (ref 80.0–100.0)
Monocytes Absolute: 0.7 10*3/uL (ref 0.1–1.0)
Monocytes Relative: 7 %
Neutro Abs: 9 10*3/uL — ABNORMAL HIGH (ref 1.7–7.7)
Neutrophils Relative %: 89 %
Platelets: 248 10*3/uL (ref 150–400)
RBC: 3.17 MIL/uL — ABNORMAL LOW (ref 4.22–5.81)
RDW: 16.2 % — ABNORMAL HIGH (ref 11.5–15.5)
WBC: 10.2 10*3/uL (ref 4.0–10.5)
nRBC: 0 % (ref 0.0–0.2)

## 2022-10-02 LAB — GLUCOSE, CAPILLARY
Glucose-Capillary: 119 mg/dL — ABNORMAL HIGH (ref 70–99)
Glucose-Capillary: 191 mg/dL — ABNORMAL HIGH (ref 70–99)
Glucose-Capillary: 156 mg/dL — ABNORMAL HIGH (ref 70–99)
Glucose-Capillary: 172 mg/dL — ABNORMAL HIGH (ref 70–99)
Glucose-Capillary: 235 mg/dL — ABNORMAL HIGH (ref 70–99)

## 2022-10-02 LAB — PROTIME-INR
INR: 2 — ABNORMAL HIGH (ref 0.8–1.2)
Prothrombin Time: 22.6 seconds — ABNORMAL HIGH (ref 11.4–15.2)

## 2022-10-02 LAB — BASIC METABOLIC PANEL
Anion gap: 10 (ref 5–15)
BUN: 15 mg/dL (ref 8–23)
CO2: 30 mmol/L (ref 22–32)
Calcium: 8.4 mg/dL — ABNORMAL LOW (ref 8.9–10.3)
Chloride: 95 mmol/L — ABNORMAL LOW (ref 98–111)
Creatinine, Ser: 1.14 mg/dL (ref 0.61–1.24)
GFR, Estimated: >60 mL/min (ref >60)
Glucose, Bld: 119 mg/dL — ABNORMAL HIGH (ref 70–99)
Potassium: 4 mmol/L (ref 3.5–5.1)
Sodium: 135 mmol/L (ref 135–145)

## 2022-10-02 LAB — COOXEMETRY PANEL
Carboxyhemoglobin: 2.3 % — ABNORMAL HIGH (ref 0.5–1.5)
Methemoglobin: <0.7 % (ref 0.0–1.5)
O2 Saturation: 56 %
Total hemoglobin: 9.8 g/dL — ABNORMAL LOW (ref 12.0–16.0)

## 2022-10-02 LAB — LACTATE DEHYDROGENASE: LDH: 208 U/L — ABNORMAL HIGH (ref 98–192)

## 2022-10-02 MED ORDER — POTASSIUM CHLORIDE CRYS ER 20 MEQ PO TBCR
40.0000 meq | EXTENDED_RELEASE_TABLET | Freq: Once | ORAL | Status: AC
  Administered 2022-10-02: 40 meq via ORAL
  Filled 2022-10-02: qty 2

## 2022-10-02 MED ORDER — WARFARIN SODIUM 1 MG PO TABS
1.0000 mg | ORAL_TABLET | Freq: Once | ORAL | Status: AC
  Administered 2022-10-02: 1 mg via ORAL
  Filled 2022-10-02: qty 1

## 2022-10-02 MED FILL — Calcium Chloride Inj 10%: INTRAVENOUS | Qty: 10 | Status: AC

## 2022-10-02 MED FILL — Sodium Bicarbonate IV Soln 8.4%: INTRAVENOUS | Qty: 50 | Status: AC

## 2022-10-02 MED FILL — Heparin Sodium (Porcine) Inj 1000 Unit/ML: INTRAMUSCULAR | Qty: 20 | Status: AC

## 2022-10-02 MED FILL — Electrolyte-R (PH 7.4) Solution: INTRAVENOUS | Qty: 3000 | Status: AC

## 2022-10-02 MED FILL — Mannitol IV Soln 20%: INTRAVENOUS | Qty: 500 | Status: AC

## 2022-10-02 MED FILL — Lidocaine HCl Local Soln Prefilled Syringe 100 MG/5ML (2%): INTRAMUSCULAR | Qty: 5 | Status: AC

## 2022-10-02 MED FILL — Sodium Chloride IV Soln 0.9%: INTRAVENOUS | Qty: 3000 | Status: AC

## 2022-10-02 NOTE — Progress Notes (Signed)
Nutrition Follow-up  DOCUMENTATION CODES:   Not applicable  INTERVENTION:   Continue Regular diet  Continue Ensure Enlive po TID, each supplement provides 350 kcal and 20 grams of protein.  D/C Pro-Source  Last BM 3 days ago, if remains constipated, recommend adjusting bowel regimen  NUTRITION DIAGNOSIS:   Inadequate oral intake related to nausea, poor appetite, acute illness as evidenced by per patient/family report.  Being addressed via supplements, lib diet  GOAL:   Patient will meet greater than or equal to 90% of their needs  Progressing  MONITOR:   PO intake, Supplement acceptance, Labs, Weight trends  REASON FOR ASSESSMENT:   Consult Assessment of nutrition requirement/status, LVAD Eval  ASSESSMENT:   72 yo male admitted for LVAD placement with recent development of rapidly progressive heart failure. PMH includes DM, acute promyelocytic leukemia s/p ATRA, CHF  2/12 Admitted 2/14 LVAD placed-HM3 2/15 Extubated 2/16 CL diet 2/19 Diet advanced to [facility] 2/20 Diet advanced to Regular  Chest tubes removed today, INR down to 2, CVP 9  Nausea much improved. Eating better, wife bringing in some outside food for pt as well. Pt also drinking some Ensure. PO intake began improving yesterday evening with meal completion 50-75% since.   Pt does not like the Pro-Source at all; ok to d/c  Labs: sodium wdl, potassium 4.0 (wdl), Creatinine wdl, CBGs 105-191 Meds: lasix, ss novolog, reglan, colace, miralax, KCl, phenergan   Diet Order:   Diet Order             Diet regular Room service appropriate? Yes; Fluid consistency: Thin  Diet effective now                   EDUCATION NEEDS:   Education needs have been addressed  Skin:  Skin Assessment: Skin Integrity Issues: Skin Integrity Issues:: Incisions Incisions: new LVAD-driveline  Last BM:  2/19  Height:   Ht Readings from Last 1 Encounters:  09/22/22 5' 9"$  (1.753 m)    Weight:   Wt Readings  from Last 1 Encounters:  10/02/22 84.8 kg     BMI:  Body mass index is 27.61 kg/m.  Estimated Nutritional Needs:   Kcal:  2150-2350 kcals  Protein:  110-130 g  Fluid:  >/= 2L   Kerman Passey MS, RDN, LDN, CNSC Registered Dietitian 3 Clinical Nutrition RD Pager and On-Call Pager Number Located in [place]

## 2022-10-02 NOTE — Progress Notes (Signed)
Patient ID: Ricardo Zuniga, male   DOB: 12/25/1950, 72 y.o.   MRN: [id number]   Advanced Heart Failure VAD Team Note  PCP-Cardiologist: [name], MD   Subjective:    2/15 S/P HMIII LVAD.  2/22 Echo: Speed increased to 5400 rpm.  Moderate RV enlargement and dysfunction.   Milrinone off, co-ox 56%   CVP 8-9, getting Lasix 40 mg IV bid.  I/Os net negative 2286.    Less nausea, eating better, walked in hall.   LVAD INTERROGATION:  HeartMate III LVAD:   Flow 4.1 liters/min, speed 5400, power 4, PI 3.1. No PI events.   LDH 213 => 183 => 208 INR 2  Objective:    Vital Signs:   Temp:  [98.2 F (36.8 C)-98.4 F (36.9 C)] 98.4 F (36.9 C) (02/21 2300) Pulse Rate:  [46-189] 110 (02/22 0500) Resp:  [16-29] 29 (02/22 0600) BP: (81-108)/(66-94) 95/66 (02/22 0500) SpO2:  [66 %-100 %] 66 % (02/22 0500) Weight:  [84.8 kg] 84.8 kg (02/22 0500) Last BM Date : 09/29/22 Mean arterial Pressure  80s Intake/Output:   Intake/Output Summary (Last 24 hours) at 10/02/2022 0739 Last data filed at 10/02/2022 0600 Gross per 24 hour  Intake 663.62 ml  Output 2950 ml  Net -2286.38 ml     Physical Exam    General: Well appearing this am. NAD.  HEENT: Normal. Neck: Supple, JVP 8 cm. Carotids OK.  Cardiac:  Mechanical heart sounds with LVAD hum present.  Lungs:  CTAB, normal effort.  Abdomen:  NT, ND, no HSM. No bruits or masses. +BS  LVAD exit site: Well-healed and incorporated. Dressing dry and intact. No erythema or drainage. Stabilization device present and accurately applied. Driveline dressing changed daily per sterile technique. Extremities:  Warm and dry. No cyanosis, clubbing, rash. 1+ edema to knees.   Neuro:  Alert & oriented x 3. Cranial nerves grossly intact. Moves all 4 extremities w/o difficulty. Affect pleasant    Telemetry   SR 90s with PVCs (personally reviewed).    EKG    N/A   Labs   Basic Metabolic Panel: Recent Labs  Lab 09/27/22 0415 09/27/22 0546  09/28/22 0505 09/29/22 0356 09/29/22 0743 09/30/22 0417 10/01/22 0453 10/02/22 0336  NA 130*   < > 132*  --  133* 134* 135 135  K 5.0   < > 3.6  --  3.6 3.3* 3.6 4.0  CL 96*  --  97*  --  96* 94* 93* 95*  CO2 24  --  24  --  26 28 29 30  $ GLUCOSE 213*  --  141*  --  104* 87 127* 119*  BUN 26*  --  27*  --  21 14 13 15  $ CREATININE 1.49*  --  1.27*  --  1.17 1.02 0.99 1.14  CALCIUM 8.4*  --  8.1*  --  8.2* 8.3* 8.4* 8.4*  MG 2.3  --  2.0 2.0  --  1.8 1.9  --   PHOS 3.3  --  2.1* 1.9*  --  2.7 2.8  --    < > = values in this interval not displayed.    Liver Function Tests: Recent Labs  Lab 09/26/22 0348 09/27/22 0415 09/28/22 1129  AST 70* 39 28  ALT 20 17 12  $ ALKPHOS 49 58 57  BILITOT 1.4* 0.5 0.6  PROT 6.4* 6.2* 6.1*  ALBUMIN 3.8 3.5 3.1*   No results for input(s): "LIPASE", "AMYLASE" in the last 168 hours. No results  for input(s): "AMMONIA" in the last 168 hours.  CBC: Recent Labs  Lab 09/27/22 0415 09/27/22 0546 09/28/22 0505 09/29/22 0356 09/30/22 0417 10/01/22 0453  WBC 14.8*  --  8.8 7.9 6.4 7.1  NEUTROABS 12.4*  --  7.3 6.7 5.1 5.6  HGB 9.1* 11.2* 8.8* 8.5* 9.1* 9.1*  HCT 27.9* 33.0* 26.2* 24.7* 26.7* 28.0*  MCV 94.3  --  92.6 90.8 89.9 92.4  PLT 128*  --  137* 168 195 229    INR: Recent Labs  Lab 09/28/22 1231 09/29/22 0356 09/30/22 0417 10/01/22 0453 10/02/22 0336  INR 6.7* 4.6* 3.5* 2.7* 2.0*    Other results: EKG:    Imaging   ECHOCARDIOGRAM LIMITED  Result Date: 10/01/2022    ECHOCARDIOGRAM LIMITED REPORT   Patient Name:   Ricardo Zuniga Date of Exam: 10/01/2022 Medical Rec #:  [id number]        Height:       69.0 in Accession #:    [id number]       Weight:       186.3 lb Date of Birth:  [birth year]        BSA:          2.005 m Patient Age:    77 years         BP:           -/- mmHg Patient Gender: M                HR:           94 bpm. Exam Location:  Inpatient Procedure: Limited Echo, Color Doppler and Cardiac Doppler Indications:     LVAD Evaluation z95.811  History:        Patient has prior history of Echocardiogram examinations, most                 recent 09/24/2022. CHF; Risk Factors:Hypertension, Diabetes and                 Dyslipidemia. Heart Mate III.  Sonographer:    Raquel Sarna Senior RDCS Referring Phys: [id number] DANIEL H ENTER  Sonographer Comments: Ramp with [name] IMPRESSIONS  1. LVAD present in the ventricular apex. Speed 5300, Flow 3.9, PI 3.6, Power 3.7. Left ventricular ejection fraction, by estimation, is 20 to 25%. The left ventricle has severely decreased function. The left ventricle demonstrates global hypokinesis.  2. Right ventricular systolic function is moderately reduced. The right ventricular size is moderately enlarged.  3. Partial aortic valve opening with each beat. The aortic valve is tricuspid. FINDINGS  Left Ventricle: LVAD present in the ventricular apex. Speed 5300, Flow 3.9, PI 3.6, Power 3.7. Left ventricular ejection fraction, by estimation, is 20 to 25%. The left ventricle has severely decreased function. The left ventricle demonstrates global hypokinesis. Right Ventricle: The right ventricular size is moderately enlarged. Right ventricular systolic function is moderately reduced. Tricuspid Valve: Tricuspid valve regurgitation is trivial. Aortic Valve: Partial aortic valve opening with each beat. The aortic valve is tricuspid. RIGHT VENTRICLE TAPSE (M-mode): 1.0 cm Skeet Latch MD Electronically signed by Skeet Latch MD Signature Date/Time: 10/01/2022/3:18:52 PM    Final      Medications:     Scheduled Medications:  (feeding supplement) PROSource Plus  30 mL Oral BID WC   amiodarone  200 mg Oral Daily   aspirin EC  81 mg Oral Daily   atorvastatin  40 mg Oral Daily   Chlorhexidine Gluconate Cloth  6 each Topical Daily  digoxin  0.0625 mg Oral Daily   docusate sodium  200 mg Oral BID   empagliflozin  10 mg Oral Daily   feeding supplement  237 mL Oral TID BM   furosemide  40 mg Intravenous  BID   insulin aspart  0-24 Units Subcutaneous Q4H   metoCLOPramide (REGLAN) injection  5 mg Intravenous Q8H   pantoprazole  40 mg Oral Daily   polyethylene glycol  17 g Oral Daily   scopolamine  1 patch Transdermal Q72H   senna  1 tablet Oral Daily   sodium chloride flush  10-40 mL Intracatheter Q12H   sodium chloride flush  3 mL Intravenous Q12H   spironolactone  25 mg Oral Daily   Warfarin - Physician Dosing Inpatient   Does not apply q1600    Infusions:  sodium chloride     sodium chloride     sodium chloride Stopped (09/30/22 1155)   promethazine (PHENERGAN) injection (IM or IVPB) Stopped (10/01/22 2218)    PRN Medications: Place/Maintain arterial line **AND** sodium chloride, sodium chloride, acetaminophen, albuterol, dextrose, morphine injection, ondansetron (ZOFRAN) IV, mouth rinse, oxyCODONE, promethazine (PHENERGAN) injection (IM or IVPB), sodium chloride flush, sodium chloride flush, traMADol, traZODone   Patient Profile  72 y/o male w/ long standing h/o chronic systolic heart failure due to nonischemic cardiomyopathy, T2DM and hx of acute promyelocytic leukemia s/p ATRA ([facility] 2014). Recent development of rapidly progressive heart failure,  NYHA class IIIb-IV symptoms. Admitted for for RHC and optimization prior to VAD implant.   RHC w/ elevated filling pressures and low output (RA 13, PAP 64/26 (40), PCWP 23, CI 2 L/min/m2) =>started on Milrinone  2/14 S/P HMIII VAD   Assessment/Plan:    1. Chronic HFrEF, NICM -> 09/24/22 S/P HMIII LVAD: Off milrinone. Speed increased to 5400 after 2/21 echo.  Good flow at 4.1. Co-ox 56%.  CVP 8-9 on Lasix 40 mg IV bid.  INR drifting down to 2.  LDH 208.  - Continue Lasix 40 mg IV bid today.   - INR 2, should be able to remove chest tubes.  Will need warfarin dose tonight.  - Continue spironolactone 25 daily.  - Continue Jardiance.  2. Acute blood loss anemia: Needs CBC today.  3. NSVT/PVCS: Rare PVCs. - Amiodarone 200 daily.   4.   Hx of promyelocytic leukemia: S/P ATRA in 2014 at [facility].  5.  Hx of endocarditis: 2015 6.  Nausea/poor appetite: Improving.   Ambulate.  Ted hose for legs.   I reviewed the LVAD parameters from today, and compared the results to the patient's prior recorded data.  No programming changes were made.  The LVAD is functioning within specified parameters.  The patient performs LVAD self-test daily.  LVAD interrogation was negative for any significant power changes, alarms or PI events/speed drops.  LVAD equipment check completed and is in good working order.  Back-up equipment present.   LVAD education done on emergency procedures and precautions and reviewed exit site care.  CRITICAL CARE Performed by: Loralie Champagne  Total critical care time: 35 minutes  Critical care time was exclusive of separately billable procedures and treating other patients.  Critical care was necessary to treat or prevent imminent or life-threatening deterioration.  Critical care was time spent personally by me on the following activities: development of treatment plan with patient and/or surrogate as well as nursing, discussions with consultants, evaluation of patient's response to treatment, examination of patient, obtaining history from patient or surrogate, ordering and performing treatments and  interventions, ordering and review of laboratory studies, ordering and review of radiographic studies, pulse oximetry and re-evaluation of patient's condition.  Loralie Champagne 10/02/2022 7:39 AM

## 2022-10-02 NOTE — Progress Notes (Signed)
LVAD Coordinator Rounding Note:  Admitted 09/22/22 to Dr. Conception Chancy service for [place] and optimization prior to VAD implant.   HeartMate III LVAD implanted on 09/25/22 by Dr.Enter under DT criteria.   Patient awake and alert this morning sitting up eating breakfast. Pt states that his nausea is improved.   CVP 9 this morning.  Lasix 40 mg BID PO. INR 2 today. All CT tubes will be removed today per Dr Tenny Craw.  Will obtain RAMP echo tomorrow per DR [name].   Vital signs: Temp: 98.4  HR: 110 Automatic BP: 82/58 (66) Doppler MAP: 90 O2 Sat: 92% on RA Wt: 194>195.7>199.9>193.2>190.4>185.6>186.3>186.9 lbs  LVAD interrogation reveals:  Speed:  5400 Flow: 4 Power: 4 w PI: 3  Alarms: none  Events: none  Fixed speed:  5300 Low speed limit: 5000 HCT: 29  Drive Line:  CDI. Anchor secure. Continue every other day dressing changes. Next dressing change due 10/03/22 by VAD Coordinator or nurse champion.  Labs:  Hgb:  9.7>9.6>8.5>9.1>9.1>9.7  WBC:  10.8>15.5>7.9>6.4>7.1>10.2  LDH trend:  284>338>245>213>183>208  INR trend:  1.6>1.7>1.8>5.5>4.6>3.5>2.7>2  Co-ox: 74.2>68.7>75.3>68.4>74>79.5>68.1>56  Creatinine: 1.4>0.92>1.41>1.49>1.27>1.17>1.02>0.99>1.14   Intra-op blood products: 4 FFP Cell Saver 650 mL  ICU Blood Products: 09/25/22 1 PRBC 09/28/22 1 FFP  Arrhythmias:   Gtts: Epinephrine: 1 mcg/min-off 09/29/22 Vasopressin: 0.04 units/min- stopped 09/27/22 Milrinone: 0.125 mcg/kg/min- stopped 10/01/22 Lasix 4 mg/hr- stopped 10/01/22  Ventilator Status:  Extubated 09/25/22  Nitric oxide:  Weaned off 09/25/22  Anticoagulation Plan: INR Goal: 2-2.5 ASA dose: 81 mg   Adverse Events on VAD:   Patient Education: Patient's wife [name] performed dressing change x 1.   Plan/Recommendations:  1. Page VAD coordinator for any issues with VAD equipment or driveline. 2. Every other day dressing changes by VAD coordinator or nurse champion.   Tanda Rockers RN [place]  Coordinator  Office: [phone]  24/7 Pager: [phone]

## 2022-10-02 NOTE — Progress Notes (Signed)
Physical Therapy Treatment Patient Details Name: Ricardo Zuniga MRN: [id number] DOB: [birth year] Today's Date: 10/02/2022   History of Present Illness 72 yo M admitted 09/22/22  for RHC and optimization prior to LVAD HM3 implantation 09/24/22. 2/15 extubated. PMH: HFrEF, HTN, HLD, GERD, leukemia, NICM due to chemotherapy, multiple back surgeries, neck fusion, DM, neuropathy, shoulder surgery x 3.    PT Comments    Pt pleasant with increased confusion this session compared to prior date with need for mod cues and assist for power transitions and safety. Pt assisted to transition to and from battery with education for LVAD parts, back up bag and sternal precautions with limited carry over end of session. Pt progressing with gait and activity tolerance and needs continued repetition.   Speed 5400, flow 4.0, PI 3.9, power 3.9 HR 105-115    Recommendations for follow up therapy are one component of a multi-disciplinary discharge planning process, led by the attending physician.  Recommendations may be updated based on patient status, additional functional criteria and insurance authorization.  Follow Up Recommendations  Home health PT     Assistance Recommended at Discharge Intermittent Supervision/Assistance  Patient can return home with the following Help with stairs or ramp for entrance;Assist for transportation;Assistance with cooking/housework;A little help with walking and/or transfers;A little help with bathing/dressing/bathroom   Equipment Recommendations  Rollator (4 wheels);BSC/3in1    Recommendations for Other Services       Precautions / Restrictions Precautions Precautions: Sternal;Fall Precaution Comments: LVAD, chest tubes     Mobility  Bed Mobility Overal bed mobility: Needs Assistance Bed Mobility: Rolling, Sidelying to Sit, Sit to Supine Rolling: Min assist Sidelying to sit: Min assist   Sit to supine: Min assist   General bed mobility comments: cues for  sequence and precautions with assist to lift trunk from surface and with returning to bed assist to lift legs to surface    Transfers Overall transfer level: Needs assistance   Transfers: Sit to/from Stand Sit to Stand: Min guard           General transfer comment: cues for hands on thighs and to back fully to surface with return to bed. Returned to bed for chest tubes to be pulled per RN request    Ambulation/Gait Ambulation/Gait assistance: Min assist Gait Distance (Feet): 160 Feet Assistive device: Rolling walker (2 wheels) Gait Pattern/deviations: Decreased stride length, Step-through pattern   Gait velocity interpretation: 1.31 - 2.62 ft/sec, indicative of limited community ambulator   General Gait Details: cues for posture, proximity to RW and direction   Stairs             Wheelchair Mobility    Modified Rankin (Stroke Patients Only)       Balance   Sitting-balance support: No upper extremity supported, Feet supported Sitting balance-Leahy Scale: Good     Standing balance support: Reliant on assistive device for balance, Bilateral upper extremity supported Standing balance-Leahy Scale: Poor Standing balance comment: RW for standing                            Cognition Arousal/Alertness: Awake/alert Behavior During Therapy: WFL for tasks assessed/performed Overall Cognitive Status: Impaired/Different from baseline Area of Impairment: Memory, Orientation, Attention                 Orientation Level: Time Current Attention Level: Sustained Memory: Decreased recall of precautions, Decreased short-term memory         General Comments:  pt with increased confusion this session having difficulty locating LVAD parts and mod cues for power transition and management. Able to recall 2/4 sternal precautions        Exercises General Exercises - Lower Extremity Long Arc Quad: AROM, Both, Seated, 20 reps Hip Flexion/Marching: AROM,  Both, Seated, 20 reps    General Comments        Pertinent Vitals/Pain Pain Assessment Pain Assessment: No/denies pain    Home Living                          Prior Function            PT Goals (current goals can now be found in the care plan section) Progress towards PT goals: Progressing toward goals    Frequency    Min 3X/week      PT Plan Current plan remains appropriate    Co-evaluation              AM-PAC PT "6 Clicks" Mobility   Outcome Measure  Help needed turning from your back to your side while in a flat bed without using bedrails?: A Little Help needed moving from lying on your back to sitting on the side of a flat bed without using bedrails?: A Little Help needed moving to and from a bed to a chair (including a wheelchair)?: A Little Help needed standing up from a chair using your arms (e.g., wheelchair or bedside chair)?: A Little Help needed to walk in hospital room?: A Lot Help needed climbing 3-5 steps with a railing? : Total 6 Click Score: 15    End of Session   Activity Tolerance: Patient tolerated treatment well Patient left: with call bell/phone within reach;in bed Nurse Communication: Mobility status PT Visit Diagnosis: Other abnormalities of gait and mobility (R26.89);Muscle weakness (generalized) (M62.81);Pain     Time: [id number] PT Time Calculation (min) (ACUTE ONLY): 34 min  Charges:  $Gait Training: 8-22 mins $Therapeutic Exercise: 8-22 mins                     Bayard Males, PT Acute Rehabilitation Services Office: [place] 10/02/2022, 11:17 AM

## 2022-10-02 NOTE — Progress Notes (Signed)
Case discussed during TCTS/CCM rounds.  Per Dr. Tenny Craw, no CCM needs today, will d/w nurse and continue to follow and be available PRN while in ICU.  Erskine Emery MD PCCM

## 2022-10-03 ENCOUNTER — Inpatient Hospital Stay (HOSPITAL_COMMUNITY): Payer: Medicare PPO

## 2022-10-03 ENCOUNTER — Inpatient Hospital Stay: Payer: Self-pay

## 2022-10-03 DIAGNOSIS — Z95811 Presence of heart assist device: Secondary | ICD-10-CM | POA: Diagnosis not present

## 2022-10-03 DIAGNOSIS — I5023 Acute on chronic systolic (congestive) heart failure: Secondary | ICD-10-CM | POA: Diagnosis not present

## 2022-10-03 LAB — GLUCOSE, CAPILLARY
Glucose-Capillary: 110 mg/dL — ABNORMAL HIGH (ref 70–99)
Glucose-Capillary: 130 mg/dL — ABNORMAL HIGH (ref 70–99)
Glucose-Capillary: 138 mg/dL — ABNORMAL HIGH (ref 70–99)
Glucose-Capillary: 141 mg/dL — ABNORMAL HIGH (ref 70–99)
Glucose-Capillary: 161 mg/dL — ABNORMAL HIGH (ref 70–99)
Glucose-Capillary: 177 mg/dL — ABNORMAL HIGH (ref 70–99)
Glucose-Capillary: 253 mg/dL — ABNORMAL HIGH (ref 70–99)

## 2022-10-03 LAB — COOXEMETRY PANEL
Carboxyhemoglobin: 2.9 % — ABNORMAL HIGH (ref 0.5–1.5)
Methemoglobin: 2.5 % — ABNORMAL HIGH (ref 0.0–1.5)
O2 Saturation: 58 %
Total hemoglobin: 9.7 g/dL — ABNORMAL LOW (ref 12.0–16.0)

## 2022-10-03 LAB — BASIC METABOLIC PANEL
Anion gap: 12 (ref 5–15)
BUN: 17 mg/dL (ref 8–23)
CO2: 29 mmol/L (ref 22–32)
Calcium: 8.5 mg/dL — ABNORMAL LOW (ref 8.9–10.3)
Chloride: 93 mmol/L — ABNORMAL LOW (ref 98–111)
Creatinine, Ser: 0.97 mg/dL (ref 0.61–1.24)
GFR, Estimated: >60 mL/min (ref >60)
Glucose, Bld: 141 mg/dL — ABNORMAL HIGH (ref 70–99)
Potassium: 3.8 mmol/L (ref 3.5–5.1)
Sodium: 134 mmol/L — ABNORMAL LOW (ref 135–145)

## 2022-10-03 LAB — ECHOCARDIOGRAM LIMITED
Est EF: <20
Height: 69 in
Weight: 2938.29 oz

## 2022-10-03 LAB — MAGNESIUM: Magnesium: 1.9 mg/dL (ref 1.7–2.4)

## 2022-10-03 LAB — LACTATE DEHYDROGENASE: LDH: 198 U/L — ABNORMAL HIGH (ref 98–192)

## 2022-10-03 LAB — CBC
HCT: 28.1 % — ABNORMAL LOW (ref 39.0–52.0)
Hemoglobin: 9.3 g/dL — ABNORMAL LOW (ref 13.0–17.0)
MCH: 30.4 pg (ref 26.0–34.0)
MCHC: 33.1 g/dL (ref 30.0–36.0)
MCV: 91.8 fL (ref 80.0–100.0)
Platelets: 260 10*3/uL (ref 150–400)
RBC: 3.06 MIL/uL — ABNORMAL LOW (ref 4.22–5.81)
RDW: 16.3 % — ABNORMAL HIGH (ref 11.5–15.5)
WBC: 9.4 10*3/uL (ref 4.0–10.5)
nRBC: 0 % (ref 0.0–0.2)

## 2022-10-03 LAB — PROTIME-INR
INR: 2.1 — ABNORMAL HIGH (ref 0.8–1.2)
Prothrombin Time: 23 seconds — ABNORMAL HIGH (ref 11.4–15.2)

## 2022-10-03 MED ORDER — MAGNESIUM SULFATE 2 GM/50ML IV SOLN
2.0000 g | Freq: Once | INTRAVENOUS | Status: AC
  Administered 2022-10-03: 2 g via INTRAVENOUS
  Filled 2022-10-03: qty 50

## 2022-10-03 MED ORDER — AMIODARONE HCL 200 MG PO TABS
200.0000 mg | ORAL_TABLET | Freq: Two times a day (BID) | ORAL | Status: DC
  Administered 2022-10-03: 200 mg via ORAL
  Filled 2022-10-03: qty 1

## 2022-10-03 MED ORDER — WARFARIN SODIUM 2 MG PO TABS
2.0000 mg | ORAL_TABLET | Freq: Once | ORAL | Status: AC
  Administered 2022-10-03: 2 mg via ORAL
  Filled 2022-10-03: qty 1

## 2022-10-03 MED ORDER — WARFARIN - PHARMACIST DOSING INPATIENT: Freq: Every day | Status: DC

## 2022-10-03 MED ORDER — POTASSIUM CHLORIDE CRYS ER 20 MEQ PO TBCR
20.0000 meq | EXTENDED_RELEASE_TABLET | Freq: Once | ORAL | Status: AC
  Administered 2022-10-03: 20 meq via ORAL
  Filled 2022-10-03: qty 1

## 2022-10-03 MED ORDER — TRAZODONE HCL 50 MG PO TABS
100.0000 mg | ORAL_TABLET | Freq: Every evening | ORAL | Status: DC | PRN
  Administered 2022-10-03 – 2022-10-06 (×4): 100 mg via ORAL
  Filled 2022-10-03 (×4): qty 2

## 2022-10-03 MED ORDER — ALBUMIN HUMAN 5 % IV SOLN
12.5000 g | Freq: Once | INTRAVENOUS | Status: AC
  Administered 2022-10-03: 12.5 g via INTRAVENOUS
  Filled 2022-10-03: qty 250

## 2022-10-03 NOTE — Progress Notes (Signed)
Drive Line:  Dressing change completed by caregiver [name] under VAD coordinator observation. Existing VAD dressing removed and site care performed using sterile technique. Drive line exit site cleaned with Chlora prep applicators x 2, allowed to dry, and gauze dressing with Silver strip applied. Exit site healing and unincorporated, the velour is fully implanted at exit site. Minimal brown drainage noted on dressing. No redness, tenderness, foul odor or rash noted. Drive line anchor repositioned and re-applied. Pt denies fever or chills. Continue every other day dressing changes. Advance to twice a week dressing changes.[name] is now checked off to complete dressing changes independently. Next dressing change due 10/06/22 by VAD Coordinator, nurse champion or caregiver.     Bobbye Morton RN, BSN VAD Coordinator 24/7 Pager [phone]

## 2022-10-03 NOTE — Progress Notes (Signed)
Occupational Therapy Treatment Patient Details Name: Ricardo Zuniga MRN: [id number] DOB: July 24, 1951 Today's Date: 10/03/2022   History of present illness 72 yo M admitted 09/22/22  for RHC and optimization prior to LVAD HM3 implantation 09/24/22. 2/15 extubated. PMH: HFrEF, HTN, HLD, GERD, leukemia, NICM due to chemotherapy, multiple back surgeries, neck fusion, DM, neuropathy, shoulder surgery x 3.   OT comments  Patient with fair progress toward patient focused goals.  RN stating increasing delirium with poor sleep.  MD in room and requesting stat CT to ensure no changes.  Patient able to stand with [employer], and Min A with transfers including verbal cues for safety and sternal precautions.  OT to continue efforts in the acute setting to address deficts, and assist with transition home.     Recommendations for follow up therapy are one component of a multi-disciplinary discharge planning process, led by the attending physician.  Recommendations may be updated based on patient status, additional functional criteria and insurance authorization.    Follow Up Recommendations    HH OT    Assistance Recommended at Discharge Intermittent Supervision/Assistance  Patient can return home with the following  Assist for transportation;Assistance with cooking/housework;A little help with bathing/dressing/bathroom;A little help with walking and/or transfers   Equipment Recommendations  BSC/3in1    Recommendations for Other Services      Precautions / Restrictions Precautions Precautions: Sternal;Fall Precaution Comments: LVAD Restrictions Weight Bearing Restrictions: Yes Other Position/Activity Restrictions: Sternal       Mobility Bed Mobility Overal bed mobility: Needs Assistance Bed Mobility: Sit to Supine       Sit to supine: Min assist        Transfers Overall transfer level: Needs assistance Equipment used: Standard walker Transfers: Sit to/from Stand, Bed to  chair/wheelchair/BSC Sit to Stand: Min guard     Step pivot transfers: Min assist           Balance Overall balance assessment: Needs assistance Sitting-balance support: No upper extremity supported, Feet supported Sitting balance-Leahy Scale: Fair     Standing balance support: Reliant on assistive device for balance, Bilateral upper extremity supported Standing balance-Leahy Scale: Poor                             ADL either performed or assessed with clinical judgement   ADL                                              Extremity/Trunk Assessment Upper Extremity Assessment Upper Extremity Assessment: Overall WFL for tasks assessed   Lower Extremity Assessment Lower Extremity Assessment: Defer to PT evaluation   Cervical / Trunk Assessment Cervical / Trunk Assessment: Normal                      Cognition Arousal/Alertness: Awake/alert Behavior During Therapy: Restless, Anxious Overall Cognitive Status: Impaired/Different from baseline                     Current Attention Level: Focused Memory: Decreased recall of precautions, Decreased short-term memory         General Comments: per RN with increasing delirum this date.  Decreased problem solving.  Pertinent Vitals/ Pain       Pain Assessment Pain Assessment: No/denies pain Pain Intervention(s): Monitored during session                                                          Frequency  Min 2X/week        Progress Toward Goals  OT Goals(current goals can now be found in the care plan section)  Progress towards OT goals: Progressing toward goals  Acute Rehab OT Goals OT Goal Formulation: With patient Time For Goal Achievement: 10/13/22 Potential to Achieve Goals: Good  Plan Discharge plan remains appropriate    Co-evaluation                 AM-PAC OT "6 Clicks" Daily Activity      Outcome Measure   Help from another person eating meals?: None Help from another person taking care of personal grooming?: A Little Help from another person toileting, which includes using toliet, bedpan, or urinal?: A Little Help from another person bathing (including washing, rinsing, drying)?: A Lot Help from another person to put on and taking off regular upper body clothing?: A Lot Help from another person to put on and taking off regular lower body clothing?: A Lot 6 Click Score: 16    End of Session Equipment Utilized During Treatment: Gait belt;Rolling walker (2 wheels)  OT Visit Diagnosis: Unsteadiness on feet (R26.81)   Activity Tolerance Patient tolerated treatment well   Patient Left in bed;with call bell/phone within reach;with family/visitor present   Nurse Communication Mobility status        Time: Ricardo Zuniga OT Time Calculation (min): 21 min  Charges: OT General Charges $OT Visit: 1 Visit OT Treatments $Self Care/Home Management : 8-22 mins  10/03/2022  RP, OTR/L  Acute Rehabilitation Services  Office:  [phone]   Metta Clines 10/03/2022, 10:23 AM

## 2022-10-03 NOTE — Discharge Summary (Signed)
Advanced Heart Failure Team  Discharge Summary   Patient ID: Ricardo Zuniga MRN: [id number], DOB/AGE: 1951/04/28 72 y.o. Admit date: 09/22/2022 D/C date:     10/07/2022   Primary Discharge Diagnoses:  Chronic HFrEF, NICM -> 09/24/22 S/P HMIII LVAD  Acute blood loss anemia NSVT / PVCs Nausea / poor appetite Altered mental status  Secondary Discharge Diagnoses:  Hx of promyelocytic leukemia Hx of endocarditis  Hospital Course:   Ricardo Zuniga is a very pleasant 72 YO WM w/ HFrEF 2/2 nonischemic cardiomyopathy, T2DM and hx of acute promyelocytic leukemia s/p ATRA ([facility] 2014) that was discharged from [facility] in October 2023 after presenting with a 1 week history of orthopnea, PND & LE edema. He had a TTE prior to admission w/ LVEF of 40-45%. Echo during admit was notable for LVEF of 30%-35%. LHC/RHC during admit w/ nonobstructive CAD & moderately reduced CI (2.1 L/min/m2). He was ultimately discharged home on low dose GDMT.    Prior to recent admit, his cardiac history dates back to 2015 when he was found to have an LVEF of 25% felt to be triggered by endocarditis.    Echo was done in 1/24 with EF 25%, severe LV dilation, global hypokinesis, moderate RV dysfunction, mild RV enlargement, moderate AI, mild-moderate MR.  RHC in 1/24 showed elevated right and left heart filling pressures, PVR 6.22 WU with mixed pulmonary venous/pulmonary arterial hypertension, CI low at 1.85.  Plan made for LVAD due to rapid deterioration.  He was directed admitted 09/22/22 for RHC and optimization prior to VAD implant. RHC w/ elevated filling pressures and low output (RA 13, PAP 64/26 (40), PCWP 23, CI 2 L/min/m2) =>started on Milrinone and diuresed w/ IV Lasix. Underwent HM3 LVAD on 2/14.   Overall tolerated procedure well. Had some days post op with nausea but resolved with time. Did have episode of AMS, CT head negative. Suspect that was from not sleeping for 48 hours, now resolved. Plan to  complete cardiac rehab.   Pt will continue to be followed closely in the VAD/AHF clinic. Dr Haroldine Laws evaluated and deemed appropriate for discharge. Medication teaching completed by PharmD prior to d/c. All VAD teaching completed by VAD coordinators. Patient has f/u in clinic with [name] 3/5 along with INR check. Meds delivered to bedside from [place].   See below for detailed problem list: 1. Chronic HFrEF, NICM -> 09/24/22 S/P HMIII LVAD: Off milrinone. Speed increased to 5400 after 2/21 echo and to 5500 after 2/23 echo.  Good flow at 4.2. Central line out.  Not significantly volume overloaded and weight below baseline.  INR 1.8, LDH 196.  (INR goal 2-2.5) - Will do 80 IV lasix this morning x1. Will follow with 40 PO lasix daily until follow-up + 20 KCL daily (3/5) - Continue spironolactone 25 daily.  - Continue Jardiance 10 mg  - no other GDMT yet w/ MAP in low 70s 2. Acute blood loss anemia: Hgb stable, 9.2  3. NSVT/PVCS: Rare PVCs. - cont amiodarone 200 mg daily.   4.  Hx of promyelocytic leukemia: S/P [facility]TRA in 2014 at [facility].  5.  Hx of endocarditis: 2015 6.  Nausea/poor appetite: Improved.  7. Altered mental status: Noted 2/23, delirium (no sleep x 2 days).  CT head without acute changes. Remains much clearer after good rest.   Discharge Weight Range: 78.1 kg  Discharge Vitals: Blood pressure 93/61, pulse 90, temperature 97.8 F (36.6 C), resp. rate 19, height '5\' 9"'$  (1.753 m),  weight 78.1 kg, SpO2 98 %.  Labs: Lab Results  Component Value Date   WBC 8.8 10/07/2022   HGB 9.2 (L) 10/07/2022   HCT 27.8 (L) 10/07/2022   MCV 91.7 10/07/2022   PLT 331 10/07/2022    Recent Labs  Lab 10/07/22 0012  NA 133*  K 4.2  CL 97*  CO2 24  BUN 20  CREATININE 0.96  CALCIUM 8.5*  GLUCOSE 183*   Lab Results  Component Value Date   CHOL 134 09/02/2022   HDL 34 (L) 09/02/2022   LDLCALC 85 09/02/2022   TRIG 75 09/02/2022   BNP (last 3 results) Recent Labs    09/19/22 1330  09/25/22 0258 09/30/22 2352  BNP 1,954.3* 1,252.3* 969.5*    ProBNP (last 3 results) No results for input(s): "PROBNP" in the last 8760 hours.   Diagnostic Studies/Procedures   RHC 2/12 w/ moderately elevated pre and post capillary filling pressures; cardiac index reduced at 2L/min/m2.   Discharge Medications   Allergies as of 10/07/2022       Reactions   Zestril [lisinopril] Cough        Medication List     STOP taking these medications    ALPRAZolam 0.25 MG tablet Commonly known as: Elige Ko 24-26 MG Generic drug: sacubitril-valsartan       TAKE these medications    acetaminophen 650 MG CR tablet Commonly known as: TYLENOL Take 650-1,300 mg by mouth every 8 (eight) hours as needed for pain.   albuterol 108 (90 Base) MCG/ACT inhaler Commonly known as: VENTOLIN HFA Inhale 2 puffs into the lungs every 6 (six) hours as needed for wheezing or shortness of breath.   amiodarone 200 MG tablet Commonly known as: PACERONE Take 1 tablet (200 mg total) by mouth daily. Start taking on: October 08, 2022 What changed: when to take this   aspirin EC 81 MG tablet Take 81 mg by mouth in the morning.   atorvastatin 40 MG tablet Commonly known as: LIPITOR Take 1 tablet (40 mg total) by mouth daily.   CertaVite/Antioxidants Tabs Take 1 tablet by mouth daily.   diazepam 5 MG tablet Commonly known as: VALIUM Take 5 mg by mouth daily as needed (vertigo).   Digoxin 62.5 MCG Tabs Take 1 tablet by mouth daily.   docusate sodium 100 MG capsule Commonly known as: COLACE Take 200 mg by mouth every evening.   furosemide 40 MG tablet Commonly known as: LASIX Take 1 tablet (40 mg total) by mouth daily. Start taking on: October 08, 2022 What changed:  medication strength when to take this   guaiFENesin 600 MG 12 hr tablet Commonly known as: MUCINEX Take 600 mg by mouth daily as needed (congestion.).   Jardiance 10 MG Tabs tablet Generic drug:  empagliflozin Take 10 mg by mouth every evening.   omeprazole 20 MG capsule Commonly known as: PRILOSEC Take 20 mg by mouth daily before breakfast.   ondansetron 4 MG tablet Commonly known as: ZOFRAN Take 1 tablet (4 mg total) by mouth every 6 (six) hours as needed for nausea.   potassium chloride SA 20 MEQ tablet Commonly known as: KLOR-CON M Take 1 tablet (20 mEq total) by mouth daily. What changed: how much to take   repaglinide 2 MG tablet Commonly known as: PRANDIN Take 2-4 mg by mouth See admin instructions. Take 2 tablets (4 mg) by mouth in the morning & take 1 tablet (2 mg) by mouth in the evening.   spironolactone 25  MG tablet Commonly known as: ALDACTONE Take 1 tablet (25 mg total) by mouth daily.   traMADol 50 MG tablet Commonly known as: ULTRAM Take 1-2 tablets (50-100 mg total) by mouth every 6 (six) hours as needed for up to 7 days for moderate pain.   traZODone 100 MG tablet Commonly known as: DESYREL Take 1 tablet (100 mg total) by mouth at bedtime as needed for sleep. What changed:  medication strength how much to take   Vitamin D3 125 MCG (5000 UT) Tabs Generic drug: Cholecalciferol Take 5,000 Units by mouth in the morning.   warfarin 5 MG tablet Commonly known as: Coumadin Take 1 tablet (5 mg) by mouth daily except half-tablet (2.5 mg) by mouth on Tuesday and Thursday   zinc gluconate 50 MG tablet Take 50 mg by mouth in the morning.               Durable Medical Equipment  (From admission, onward)           Start     Ordered   10/06/22 1046  For home use only DME Bedside commode  Once       Question Answer Comment  Patient needs a bedside commode to treat with the following condition LVAD (left ventricular assist device) present [facility])   Patient needs a bedside commode to treat with the following condition Heart failure ([place])      10/06/22 1047   10/06/22 1045  For home use only DME 4 wheeled rolling walker with seat  Once        Question Answer Comment  Patient needs a walker to treat with the following condition Heart failure [facility])   Patient needs a walker to treat with the following condition LVAD (left ventricular assist device) present [facility])      10/06/22 1047            Disposition   The patient will be discharged in stable condition to home. Discharge Instructions     Amb Referral to Cardiac Rehabilitation   Complete by: As directed    Diagnosis:  Heart Failure (see criteria below if ordering Phase II) Other     Heart Failure Type: Chronic Systolic & Diastolic   After initial evaluation and assessments completed: Virtual Based Care may be provided alone or in conjunction with Phase 2 Cardiac Rehab based on patient barriers.: Yes   Intensive Cardiac Rehabilitation (ICR) [place] location only OR Traditional Cardiac Rehabilitation (TCR) *If criteria for ICR are not met will enroll in TCR [facility] only): Yes       Follow-up Information     [facility] Follow up on 10/14/2022.   Specialty: Cardiology Why: Follow up in the [place] at [facility] 10/14/22 at [place], free valet.  Please bring all medications with you Contact information: [address] [id number] [place] [phone]                 Duration of Discharge Encounter: Greater than 35 minutes   Signed, Earnie Larsson AGACNP-BC  10/07/2022, 12:06 PM

## 2022-10-03 NOTE — Progress Notes (Signed)
10/03/2022 Discussed during TCTS/CCM rounding. Getting nearer to readiness for CIR. Balanced fluid management, warfarin per primary.  Available as needed while in ICU.  Erskine Emery MD PCCM

## 2022-10-03 NOTE — Progress Notes (Signed)
  Echocardiogram 2D Echocardiogram has been performed.  Ricardo Zuniga 10/03/2022, 3:21 PM

## 2022-10-03 NOTE — Progress Notes (Signed)
LVAD Coordinator Rounding Note:  Admitted 09/22/22 to Dr. Conception Chancy service for [place] and optimization prior to VAD implant.   HeartMate III LVAD implanted on 09/25/22 by Dr.Enter under DT criteria.   Patient awake and alert this morning sitting up eating breakfast. Pt states that his nausea is improved.   CVP 9 this morning.  Lasix 40 mg BID IV. INR 2.1 today. All CT tubes will be removed yesterday. Central line will be removed today. Will order pt home equipment today. Pt has transfer orders for the floor.  Will obtain RAMP echo today '@bedside'$  at 2:30p per DR [name].   Vital signs: Temp: 98.5  HR: 110 Automatic BP: 105/70 (82) Doppler MAP: 94 O2 Sat: 92% on RA Wt: 194>195.7>199.9>193.2>190.4>185.6>186.3>186.9>183.6 lbs  LVAD interrogation reveals:  Speed:  5400 Flow: 4.2 Power: 3.8w PI: 3.3  Alarms: none  Events: none  Fixed speed:  5300 Low speed limit: 5000 HCT: 28  Drive Line:  CDI. Anchor secure. Continue every other day dressing changes. Next dressing change due 10/03/22 by VAD Coordinator or nurse champion.  Labs:  Hgb:  9.7>9.6>8.5>9.1>9.1>9.7>9.3  WBC:  10.8>15.5>7.9>6.4>7.1>10.2>9.4  LDH trend:  284>338>245>213>183>208>198  INR trend:  1.6>1.7>1.8>5.5>4.6>3.5>2.7>2>2.1  Co-ox: 74.2>68.7>75.3>68.4>74>79.5>68.1>56>58  Creatinine: 1.4>0.92>1.41>1.49>1.27>1.17>1.02>0.99>1.14>0.97   Intra-op blood products: 4 FFP Cell Saver 650 mL  ICU Blood Products: 09/25/22 1 PRBC 09/28/22 1 FFP  Arrhythmias:   Gtts: Epinephrine: 1 mcg/min-off 09/29/22 Vasopressin: 0.04 units/min- stopped 09/27/22 Milrinone: 0.125 mcg/kg/min- stopped 10/01/22 Lasix 4 mg/hr- stopped 10/01/22  Ventilator Status:  Extubated 09/25/22  Nitric oxide:  Weaned off 09/25/22  Anticoagulation Plan: INR Goal: 2-2.5 ASA dose: 81 mg   Adverse Events on VAD:   Patient Education: Patient's wife [name] performed dressing change x 1.   Plan/Recommendations:  1. Page VAD coordinator for  any issues with VAD equipment or driveline. 2. Every other day dressing changes by VAD coordinator or nurse champion.   Tanda Rockers RN [place] Coordinator  Office: [phone]  24/7 Pager: [phone]

## 2022-10-03 NOTE — Progress Notes (Signed)
Bedside teaching done today with patient and his wife. Reviewed all home equipment (MPU, Universal Battery Charger, Batteries, Clips and patient controller) Also review daily routine with VAD and general information. VAD Coordinator ensure both patient and wife has [facility] phone number and Pager number stored in their phone. Will continue teaching on Monday at bedside.  Bobbye Morton RN, BSN VAD Coordinator 24/7 Pager [phone]

## 2022-10-03 NOTE — Progress Notes (Addendum)
Patient ID: Ricardo Zuniga, male   DOB: July 03, 1951, 72 y.o.   MRN: [id number]   Advanced Heart Failure VAD Team Note  PCP-Cardiologist: [name], MD   Subjective:    2/15 S/P HMIII LVAD.  2/22 Echo: Speed increased to 5400 rpm.  Moderate RV enlargement and dysfunction.   Milrinone off, co-ox 58%. Renal fx stable, SCr 0.97. 2L in UOP yesterday.  CVP currently disconected. RN to fix.   Denies dyspnea. Nauseated most of day yesterday. Feels slightly better today. No BM in the last 2 days, passing gas.    INR 2.1, Hgb 9.3  LDH 213 => 183 => 208 =>198   LVAD INTERROGATION:  HeartMate III LVAD:   Flow 4.2 liters/min, speed 5450, power 3.9, PI 3.3. No PI events.   Objective:    Vital Signs:   Temp:  [97.6 F (36.4 C)-98.7 F (37.1 C)] 98.7 F (37.1 C) (02/22 1900) Pulse Rate:  [85-178] 111 (02/23 0700) Resp:  [14-27] 26 (02/23 0700) BP: (87-138)/(58-118) 105/70 (02/23 0700) SpO2:  [69 %-100 %] 69 % (02/23 0700) Weight:  [83.3 kg] 83.3 kg (02/23 0500) Last BM Date : 09/30/22 Mean arterial Pressure  80s Intake/Output:   Intake/Output Summary (Last 24 hours) at 10/03/2022 0724 Last data filed at 10/02/2022 2200 Gross per 24 hour  Intake 50 ml  Output 2100 ml  Net -2050 ml     Physical Exam    General: fatigued appearing, sitting up in bed. NAD.  HEENT: Normal. Neck: Supple, JVD not elevated,. Carotids OK.  Cardiac:  Mechanical heart sounds with LVAD hum present.  Lungs:  CTAB, normal effort.  Abdomen:  NT, ND, no HSM. No bruits or masses. +BS  LVAD exit site: Well-healed and incorporated. Dressing dry and intact. No erythema or drainage. Stabilization device present and accurately applied. Driveline dressing changed daily per sterile technique. Extremities:  Warm and dry. No cyanosis, clubbing, rash. 1+ b/l pretibial edema    Neuro:  Alert & oriented x 3. Cranial nerves grossly intact. Moves all 4 extremities w/o difficulty. Affect pleasant    Telemetry   SR  90s with frequent PVCs (personally reviewed).    EKG    N/A   Labs   Basic Metabolic Panel: Recent Labs  Lab 09/27/22 0415 09/27/22 0546 09/28/22 0505 09/29/22 0356 09/29/22 0743 09/30/22 0417 10/01/22 0453 10/02/22 0336 10/03/22 0403  NA 130*   < > 132*  --  133* 134* 135 135 134*  K 5.0   < > 3.6  --  3.6 3.3* 3.6 4.0 3.8  CL 96*  --  97*  --  96* 94* 93* 95* 93*  CO2 24  --  24  --  '26 28 29 30 29  '$ GLUCOSE 213*  --  141*  --  104* 87 127* 119* 141*  BUN 26*  --  27*  --  '21 14 13 15 17  '$ CREATININE 1.49*  --  1.27*  --  1.17 1.02 0.99 1.14 0.97  CALCIUM 8.4*  --  8.1*  --  8.2* 8.3* 8.4* 8.4* 8.5*  MG 2.3  --  2.0 2.0  --  1.8 1.9  --  1.9  PHOS 3.3  --  2.1* 1.9*  --  2.7 2.8  --   --    < > = values in this interval not displayed.    Liver Function Tests: Recent Labs  Lab 09/27/22 0415 09/28/22 1129  AST 39 28  ALT 17 12  ALKPHOS  58 57  BILITOT 0.5 0.6  PROT 6.2* 6.1*  ALBUMIN 3.5 3.1*   No results for input(s): "LIPASE", "AMYLASE" in the last 168 hours. No results for input(s): "AMMONIA" in the last 168 hours.  CBC: Recent Labs  Lab 09/28/22 0505 09/29/22 0356 09/30/22 0417 10/01/22 0453 10/02/22 0828 10/03/22 0403  WBC 8.8 7.9 6.4 7.1 10.2 9.4  NEUTROABS 7.3 6.7 5.1 5.6 9.0*  --   HGB 8.8* 8.5* 9.1* 9.1* 9.7* 9.3*  HCT 26.2* 24.7* 26.7* 28.0* 29.1* 28.1*  MCV 92.6 90.8 89.9 92.4 91.8 91.8  PLT 137* 168 195 229 248 260    INR: Recent Labs  Lab 09/29/22 0356 09/30/22 0417 10/01/22 0453 10/02/22 0336 10/03/22 0403  INR 4.6* 3.5* 2.7* 2.0* 2.1*    Other results: EKG:    Imaging   ECHOCARDIOGRAM LIMITED  Result Date: 10/01/2022    ECHOCARDIOGRAM LIMITED REPORT   Patient Name:   Ricardo Zuniga Date of Exam: 10/01/2022 Medical Rec #:  [id number]        Height:       69.0 in Accession #:    [id number]       Weight:       186.3 lb Date of Birth:  1951-06-13        BSA:          2.005 m Patient Age:    38 years         BP:           -/-  mmHg Patient Gender: M                HR:           94 bpm. Exam Location:  Inpatient Procedure: Limited Echo, Color Doppler and Cardiac Doppler Indications:    LVAD Evaluation z95.811  History:        Patient has prior history of Echocardiogram examinations, most                 recent 09/24/2022. CHF; Risk Factors:Hypertension, Diabetes and                 Dyslipidemia. Heart Mate III.  Sonographer:    Raquel Sarna Senior RDCS Referring Phys: [id number] DANIEL H ENTER  Sonographer Comments: Ramp with [name] IMPRESSIONS  1. LVAD present in the ventricular apex. Speed 5300, Flow 3.9, PI 3.6, Power 3.7. Left ventricular ejection fraction, by estimation, is 20 to 25%. The left ventricle has severely decreased function. The left ventricle demonstrates global hypokinesis.  2. Right ventricular systolic function is moderately reduced. The right ventricular size is moderately enlarged.  3. Partial aortic valve opening with each beat. The aortic valve is tricuspid. FINDINGS  Left Ventricle: LVAD present in the ventricular apex. Speed 5300, Flow 3.9, PI 3.6, Power 3.7. Left ventricular ejection fraction, by estimation, is 20 to 25%. The left ventricle has severely decreased function. The left ventricle demonstrates global hypokinesis. Right Ventricle: The right ventricular size is moderately enlarged. Right ventricular systolic function is moderately reduced. Tricuspid Valve: Tricuspid valve regurgitation is trivial. Aortic Valve: Partial aortic valve opening with each beat. The aortic valve is tricuspid. RIGHT VENTRICLE TAPSE (M-mode): 1.0 cm Skeet Latch MD Electronically signed by Skeet Latch MD Signature Date/Time: 10/01/2022/3:18:52 PM    Final      Medications:     Scheduled Medications:  amiodarone  200 mg Oral Daily   aspirin EC  81 mg Oral Daily   atorvastatin  40 mg Oral Daily  Chlorhexidine Gluconate Cloth  6 each Topical Daily   digoxin  0.0625 mg Oral Daily   docusate sodium  200 mg Oral BID    empagliflozin  10 mg Oral Daily   feeding supplement  237 mL Oral TID BM   furosemide  40 mg Intravenous BID   insulin aspart  0-24 Units Subcutaneous Q4H   metoCLOPramide (REGLAN) injection  5 mg Intravenous Q8H   pantoprazole  40 mg Oral Daily   polyethylene glycol  17 g Oral Daily   scopolamine  1 patch Transdermal Q72H   senna  1 tablet Oral Daily   sodium chloride flush  10-40 mL Intracatheter Q12H   sodium chloride flush  3 mL Intravenous Q12H   spironolactone  25 mg Oral Daily   Warfarin - Physician Dosing Inpatient   Does not apply q1600    Infusions:  sodium chloride     sodium chloride     sodium chloride Stopped (09/30/22 1155)   promethazine (PHENERGAN) injection (IM or IVPB) Stopped (10/02/22 2123)    PRN Medications: Place/Maintain arterial line **AND** sodium chloride, sodium chloride, acetaminophen, albuterol, dextrose, morphine injection, ondansetron (ZOFRAN) IV, mouth rinse, oxyCODONE, promethazine (PHENERGAN) injection (IM or IVPB), sodium chloride flush, sodium chloride flush, traMADol, traZODone   Patient Profile  72 y/o male w/ long standing h/o chronic systolic heart failure due to nonischemic cardiomyopathy, T2DM and hx of acute promyelocytic leukemia s/p ATRA ([facility] 2014). Recent development of rapidly progressive heart failure,  NYHA class IIIb-IV symptoms. Admitted for for RHC and optimization prior to VAD implant.   RHC w/ elevated filling pressures and low output (RA 13, PAP 64/26 (40), PCWP 23, CI 2 L/min/m2) =>started on Milrinone  2/14 S/P HMIII VAD   Assessment/Plan:    1. Chronic HFrEF, NICM -> 09/24/22 S/P HMIII LVAD: Off milrinone. Speed increased to 5400 after 2/21 echo.  Good flow at 4.2. Co-ox 58%.  CVP 10.  INR 2.1.  LDH 198.  - Continue Lasix 40 mg IV bid today.   - Continue spironolactone 25 daily.  - Continue Jardiance.  - Coumadin per pharmacy  2. Acute blood loss anemia: Hgb stable, 9.3 3. NSVT/PVCS: Rare PVCs. - Amiodarone 200  daily.   4.  Hx of promyelocytic leukemia: S/P ATRA in 2014 at [facility].  5.  Hx of endocarditis: 2015 6.  Nausea/poor appetite: Improving.   Lyda Jester, PA-C 10/03/2022  Patient seen with PA, agree with the above note.   He diuresed well yesterday, I/Os net negative 2050.  CVP 10 today.  Co-ox 58%.  Weight down 3 lbs.  MAP 82.  LVAD parameters stable.   Chest tubes out.   Eating better and walking.   General: Well appearing this am. NAD.  HEENT: Normal. Neck: Supple, JVP 10 cm. Carotids OK.  Cardiac:  Mechanical heart sounds with LVAD hum present.  Lungs:  CTAB, normal effort.  Abdomen:  NT, ND, no HSM. No bruits or masses. +BS  LVAD exit site: Well-healed and incorporated. Dressing dry and intact. No erythema or drainage. Stabilization device present and accurately applied. Driveline dressing changed daily per sterile technique. Extremities:  Warm and dry. No cyanosis, clubbing, rash. 1+ edema 1/2 to knees bilaterally.  Neuro:  Alert & oriented x 3. Cranial nerves grossly intact. Moves all 4 extremities w/o difficulty. Affect pleasant    Would give Lasix 40 mg IV bid today, probably to po tomorrow.   Remove CVL, place PICC. Continue to follow CVP/co-ox.   Will arrange  for ramp echo today.   [place] for [facility] if surgery agrees.   Loralie Champagne 10/03/2022 7:43 AM  Confusion noted by wife, LVAD coordinators and physical therapist this morning.  No focal neurologic abnormalities but difficulty performing tasks with LVAD teaching  He did not sleep much for the last 2 nights so possible mild delirium but will get CT head to evaluate.   Loralie Champagne 10/03/2022 10:26 AM

## 2022-10-03 NOTE — Progress Notes (Signed)
CT head this morning with no acute findings.  He seems less confused this afternoon.  Vitals stable, no fever.   Ramp echo done today, speed increased to 5500 rpm with midline septum.  I do note that there is more organized fibrinous material around the RV than I saw on the last echo but not markedly so.  There is no evidence for hemodynamic effect.   Loralie Champagne 10/03/2022 3:29 PM

## 2022-10-03 NOTE — Progress Notes (Addendum)
Speed  Flow  PI  Power  LVIDD  AI  Aortic opening MR  TR  Septum  RV  VTI (>18cm)  5400  3.9 3.4 3.9 5.0 none 3/5 (Slightly opening)  trival  mild midline mod    5500  4.2 3.3 4.0 5.6 none 2/5 (Slightly opening) none mild midline mod                                                            Auto cuff BP: 96/83 (88)   Ramp ECHO performed at bedside per Dr.McLean  At completion of ramp study, patients primary controller programmed:  Fixed speed:5500 Low speed limit:5200  Ashby Dawes, VAD Coordinator 24/7 pager [phone]

## 2022-10-03 NOTE — Progress Notes (Signed)
ANTICOAGULATION CONSULT NOTE  Pharmacy Consult for Coumadin Indication:  LVAD  Allergies  Allergen Reactions   Zestril [Lisinopril] Cough    Patient Measurements: Height: '5\' 9"'$  (175.3 cm) Weight: 83.3 kg (183 lb 10.3 oz) IBW/kg (Calculated) : 70.7  Vital Signs: Temp: 97.5 F (36.4 C) (02/23 1122) BP: 92/79 (02/23 0900) Pulse Rate: 81 (02/23 1130)  Labs: Recent Labs    10/01/22 0453 10/02/22 0336 10/02/22 0828 10/03/22 0403  HGB 9.1*  --  9.7* 9.3*  HCT 28.0*  --  29.1* 28.1*  PLT 229  --  248 260  LABPROT 28.4* 22.6*  --  23.0*  INR 2.7* 2.0*  --  2.1*  CREATININE 0.99 1.14  --  0.97    Estimated Creatinine Clearance: 68.8 mL/min (by C-G formula based on SCr of 0.97 mg/dL).   Medical History: Past Medical History:  Diagnosis Date   Anal fissure    APML (acute promyelocytic leukemia) in remission ([place])    Completed treatment 04/2013   CHF (congestive heart failure) (HCC)    Coronary artery disease    Nonobstructive at cardiac catheterization 09/2018   Difficult intubation    w/shoulder surgery at surgical center between 2005-2008   Gastroesophageal reflux disease    History of blood transfusion 2014   History of kidney stones    passed stones   Hypercholesteremia    Neuropathy    bilateral feet   Nonischemic cardiomyopathy ([place])    a. EF 20% in 2015 - thought to possibly be viral induced or chemotherapy induced from treatments in 2014 b. at 35-40% by echo in 05/2016    Peptic ulcer disease    Pneumonia    Yrs ago   Type 2 diabetes mellitus ([place])    Ureteral colic    Vertigo    tx with valium prn   Wears glasses    Wears hearing aid in both ears     Assessment: 72 yo male s/p LVAD with HM3 on 2/14.  Warfarin initiated 2/16 with quick INR jump to 6.7.  Doses held 2/18-2/21, reinitiated 2/22.  INR 2.1 and pt now with increased po intake.  CBC stable, no overt bleeding or complications noted.  Goal of Therapy:  INR 2-2.5 Monitor platelets by  anticoagulation protocol: Yes   Plan:  Coumadin 2 mg po x 1 tonight. Daily INR.  [place] Crane, Roylene Reason, BCCP Clinical Pharmacist  10/03/2022 1:31 PM   [facility] pharmacy phone numbers are listed on [email or web address]

## 2022-10-03 NOTE — Progress Notes (Addendum)
While at the pts bedside to do teaching with the wife, pt seems more confused than during rounds this morning. Pt is able to answer most questions but is making odd comments and wife feels pt is "very off from baseline." D/w Dr [name], stat head CT ordered. Pt transported to CT by VAD coordinators and head CT obtained. Discussed meds with pharmacy, scopolamine and IV pherergan d/cd as this may be causing the confusion.  CT head results:  Chronic Right cerebellum PICA infarct but No acute intracranial abnormality.   Tanda Rockers RN, BSN VAD Coordinator 24/7 Pager [phone]

## 2022-10-04 DIAGNOSIS — I5023 Acute on chronic systolic (congestive) heart failure: Secondary | ICD-10-CM | POA: Diagnosis not present

## 2022-10-04 LAB — BASIC METABOLIC PANEL
Anion gap: 13 (ref 5–15)
BUN: 17 mg/dL (ref 8–23)
CO2: 28 mmol/L (ref 22–32)
Calcium: 8.8 mg/dL — ABNORMAL LOW (ref 8.9–10.3)
Chloride: 95 mmol/L — ABNORMAL LOW (ref 98–111)
Creatinine, Ser: 1.02 mg/dL (ref 0.61–1.24)
GFR, Estimated: >60 mL/min (ref >60)
Glucose, Bld: 100 mg/dL — ABNORMAL HIGH (ref 70–99)
Potassium: 4.4 mmol/L (ref 3.5–5.1)
Sodium: 136 mmol/L (ref 135–145)

## 2022-10-04 LAB — GLUCOSE, CAPILLARY
Glucose-Capillary: 129 mg/dL — ABNORMAL HIGH (ref 70–99)
Glucose-Capillary: 182 mg/dL — ABNORMAL HIGH (ref 70–99)
Glucose-Capillary: 183 mg/dL — ABNORMAL HIGH (ref 70–99)
Glucose-Capillary: 203 mg/dL — ABNORMAL HIGH (ref 70–99)
Glucose-Capillary: 179 mg/dL — ABNORMAL HIGH (ref 70–99)
Glucose-Capillary: 117 mg/dL — ABNORMAL HIGH (ref 70–99)

## 2022-10-04 LAB — CBC
HCT: 28.7 % — ABNORMAL LOW (ref 39.0–52.0)
Hemoglobin: 9.6 g/dL — ABNORMAL LOW (ref 13.0–17.0)
MCH: 30.5 pg (ref 26.0–34.0)
MCHC: 33.4 g/dL (ref 30.0–36.0)
MCV: 91.1 fL (ref 80.0–100.0)
Platelets: 281 10*3/uL (ref 150–400)
RBC: 3.15 MIL/uL — ABNORMAL LOW (ref 4.22–5.81)
RDW: 16.2 % — ABNORMAL HIGH (ref 11.5–15.5)
WBC: 9.4 10*3/uL (ref 4.0–10.5)
nRBC: 0 % (ref 0.0–0.2)

## 2022-10-04 LAB — PROTIME-INR
INR: 1.6 — ABNORMAL HIGH (ref 0.8–1.2)
Prothrombin Time: 19 seconds — ABNORMAL HIGH (ref 11.4–15.2)

## 2022-10-04 LAB — MAGNESIUM: Magnesium: 2.5 mg/dL — ABNORMAL HIGH (ref 1.7–2.4)

## 2022-10-04 LAB — LACTATE DEHYDROGENASE: LDH: 192 U/L (ref 98–192)

## 2022-10-04 LAB — DIGOXIN LEVEL: Digoxin Level: 0.3 ng/mL — ABNORMAL LOW (ref 0.8–2.0)

## 2022-10-04 MED ORDER — SORBITOL 70 % SOLN
30.0000 mL | Freq: Once | Status: AC
  Administered 2022-10-04: 30 mL via ORAL
  Filled 2022-10-04: qty 30

## 2022-10-04 MED ORDER — AMIODARONE HCL 200 MG PO TABS
200.0000 mg | ORAL_TABLET | Freq: Every day | ORAL | Status: DC
  Administered 2022-10-04 – 2022-10-07 (×4): 200 mg via ORAL
  Filled 2022-10-04 (×4): qty 1

## 2022-10-04 MED ORDER — WARFARIN SODIUM 3 MG PO TABS
3.0000 mg | ORAL_TABLET | Freq: Once | ORAL | Status: AC
  Administered 2022-10-04: 3 mg via ORAL
  Filled 2022-10-04: qty 1

## 2022-10-04 MED ORDER — WARFARIN SODIUM 5 MG PO TABS: 5.0000 mg | ORAL_TABLET | Freq: Once | ORAL | Status: DC

## 2022-10-04 NOTE — Op Note (Addendum)
OPERATIVE NOTE: Patient Name: Ricardo Zuniga Date of Birth: 07/19/1951 Date of Operation: 09/24/22  OPERATION: Left Ventricular Assist Device (Heartmate 3) Closure of Aortic Valve, Suture  SURGEON: Pierre Bali Daril Warga MD   ASSISTANT: Ivin Poot MD   FINDINGS: LVAD cannula excellent placement across from mitral valve   Flows up to 3.5L/min in OR  No AI after aortic valve closure  Had around 20 points of arterial waveform pulsatility in the OR   SPECIMENS Left ventricular apical core   TUBES:  2 24 Fr blakes (bilateral pleural) 28 Fr straight and 28 Fr right angle (mediastinal)  INDICATIONS FOR PROCEDURE: Ricardo Zuniga is a very pleasant 72 YO WM w/ HFrEF 2/2 nonischemic cardiomyopathy, T2DM and hx of acute promyelocytic leukemia s/p ATRA ([facility] 2014) that was discharged from [facility] in October 2023 after presenting with a 1 week history of orthopnea, PND & LE edema. He had a TTE prior to admission w/ LVEF of 40-45%. Echo during admit was notable for LVEF of 30%-35%. LHC/RHC during admit w/ nonobstructive CAD & moderately reduced CI (2.1 L/min/m2). He was ultimately discharged home on low dose GDMT.    Prior to recent admit, his cardiac history dates back to 2015 when he was found to have an LVEF of 25% felt to be triggered by endocarditis.    Echo was done in 1/24 with EF 25%, severe LV dilation, global hypokinesis, moderate RV dysfunction, mild RV enlargement, moderate AI, mild-moderate MR.  RHC in 1/24 showed elevated right and left heart filling pressures, PVR 6.22 WU with mixed pulmonary venous/pulmonary arterial hypertension, CI low at 1.85.  Plan made for LVAD due to rapid deterioration.   He was directed admitted 09/22/22 for  RHC and optimization prior to VAD implant. RHC w/ elevated filling pressures and low output (RA 13, PAP 64/26 (40), PCWP 23, CI 2 L/min/m2) =>started on Milrinone and diuresed w/ IV Lasix.  Risks/benefits/alternatives were discussed at  length (80% straightforward recovery, 15% morbidity [any organ, including bleeding or return to OR, 5% mortality].  No lifting greater than 15 lbs for 6 weeks. Total recovery expected by 2 months. All questions were asked and answered.     PROCEDURE IN DETAIL: The patient was brought in the operating room and laid in supine position.  The patient was prepped and draped in standard fashion.  An arterial, pulmonary arterial and venous lines were placed by anesthesia along with a single-lumen endotracheal tube. By TEE, it was clear there was at least moderate AI. Local analgesia given to the sternum. Sternotomy was performed and the pericardium was opened.  Left pericardium was taken down.  I tunneled out the driveline to three finger breadths below the right costal margin. I then tunneled out the VAD as well and put the device in a vancomycin soaked lap pad. Full dose heparin was given to achieve an ACT of 480 and aortic and venous cannulas were inserted.The patient was placed on cardiopulmonary bypass.   Next, attention was turned to closing the aortic valve. I placed an antegrade catheter.  Aortic crossclamp placed and heart arrested with 1L of microplegia. Crossclamp time was 22 minutes. I opened the aorta. I evaluated the valve. There was slight prolapse of the noncoronary leaflet. There were thickened nodules of arantius, significantly more than usual on at least 2 leaflets. This appeared to be evidence of prior endocarditis. The leaflets did look to be good enough tissue quality to close the valve as opposed to replace  it. Thus, I placed a 4-0 RB-1 pledgeted suture with 3 pledgets on all 3 leaflets, and then closed the aorta in 2 layers. Crossclamp released, heart reperfused.    Next, eight laps put under the heart and apex elevated. Position checked by echo to be across from mitral valve. Bioglue and felt used affix sewing ring to left ventricle. This was done with running Ethibond in quadrants. One  layer of felt had been added to under the sewing ring as well. An apical core was down with core device. Near circumferential muscle was then resected inside the ventricle. The Heartmate 3 was inserted. It was locked in place. Vent attached to the outflow graft. VAD and heart de-aired. Flow down and partial crossclamp on the aorta. Flow back up. Linear aortotomy and with 4-0 prolene the outflow graft sewed onto the aorta. Next, root vent was placed and on. Bypass was weaned and the VAD speed increased from 3000, to 3400, then by 200 increments to 5000. Around there were were flowing 3.6 liters. Bypass had been weaned and cannulas were removed, then protamine was given and hemostasis was achieved. Chest tubes were placed. No pacing wires placed.  The chest was then closed in interrupted steel wire and the presternal layers were closed in 3 layers of absorbable suture. I secured the driveline with three number one prolene sutures. These can but cut around 1 month.  The patient had a stable status and was transferred to the postoperative care unit. We checked the chest tubes output in the OR for a half hour to ensure would not be excessive. All surgical counts were correct.

## 2022-10-04 NOTE — Progress Notes (Signed)
ANTICOAGULATION CONSULT NOTE  Pharmacy Consult for Coumadin Indication:  LVAD  Allergies  Allergen Reactions   Zestril [Lisinopril] Cough    Patient Measurements: Height: '5\' 9"'$  (175.3 cm) Weight: 83.3 kg (183 lb 10.3 oz) IBW/kg (Calculated) : 70.7  Vital Signs: Temp: 98.2 F (36.8 C) (02/24 0755) Temp Source: Oral (02/24 0755) BP: 83/68 (02/24 0828) Pulse Rate: 180 (02/24 0828)  Labs: Recent Labs    10/02/22 0336 10/02/22 0828 10/02/22 0828 10/03/22 0403 10/04/22 0246  HGB  --  9.7*   < > 9.3* 9.6*  HCT  --  29.1*  --  28.1* 28.7*  PLT  --  248  --  260 281  LABPROT 22.6*  --   --  23.0* 19.0*  INR 2.0*  --   --  2.1* 1.6*  CREATININE 1.14  --   --  0.97 1.02   < > = values in this interval not displayed.     Estimated Creatinine Clearance: 65.5 mL/min (by C-G formula based on SCr of 1.02 mg/dL).   Medical History: Past Medical History:  Diagnosis Date   Anal fissure    APML (acute promyelocytic leukemia) in remission ([place])    Completed treatment 04/2013   CHF (congestive heart failure) (HCC)    Coronary artery disease    Nonobstructive at cardiac catheterization 09/2018   Difficult intubation    w/shoulder surgery at surgical center between 2005-2008   Gastroesophageal reflux disease    History of blood transfusion 2014   History of kidney stones    passed stones   Hypercholesteremia    Neuropathy    bilateral feet   Nonischemic cardiomyopathy ([name])    a. EF 20% in 2015 - thought to possibly be viral induced or chemotherapy induced from treatments in 2014 b. at 35-40% by echo in 05/2016    Peptic ulcer disease    Pneumonia    Yrs ago   Type 2 diabetes mellitus ([place])    Ureteral colic    Vertigo    tx with valium prn   Wears glasses    Wears hearing aid in both ears     Assessment: 72 yo male s/p LVAD with HM3 on 2/14.  Warfarin '5mg'$  qd initiated post op 2/16 with quick INR jump to 6.7.  Doses held 2/18-2/21, reinitiated low dose  2/22.  INR now  down 1.6 now tat pt stable and increased po intake.  CBC stable, no overt bleeding or complications noted.  Goal of Therapy:  INR 2-2.5 Monitor platelets by anticoagulation protocol: Yes   Plan:  Coumadin 3 mg po x 1 tonight. Daily INR.  Bonnita Nasuti Pharm.D. CPP, BCPS Clinical Pharmacist [phone] 10/04/2022 10:25 AM   [facility] pharmacy phone numbers are listed on [email or web address]

## 2022-10-04 NOTE — Progress Notes (Signed)
Patient ID: Ricardo Zuniga, male   DOB: [birth year], 72 y.o.   MRN: [id number]   Advanced Heart Failure VAD Team Note  PCP-Cardiologist: Ricardo Lesches, MD   Subjective:    2/15 S/P HMIII LVAD.  2/22 Echo: Speed increased to 5400 rpm.  Moderate RV enlargement and dysfunction. Organized pericardial effusion.  2/24 Echo: Speed increased to 5500 rpm.  Moderate RVE/moderate dysfunction.  Organized pericardial effusion adjacent to RV without hemodynamic effect.   Central line out, no co-ox today.  Good diuresis, weight down 3 lbs.   Feeling much better than yesterday, alert, ate breakfast.     INR 1.6, Hgb 9.6 LDH 213 => 183 => 208 =>198 => 192  LVAD INTERROGATION:  HeartMate III LVAD:   Flow 4.3 liters/min, speed 5500, power 4.1, PI 3.2. 6 PI events.   Objective:    Vital Signs:   Temp:  [97.5 F (36.4 C)-98.5 F (36.9 C)] 98.2 F (36.8 C) (02/24 0755) Pulse Rate:  [51-174] 108 (02/24 0600) Resp:  [13-28] 18 (02/24 0600) BP: (73-103)/(55-83) 103/68 (02/24 0400) SpO2:  [75 %-100 %] 91 % (02/24 0600) Last BM Date : 10/01/22 Mean arterial Pressure  70s-80s Intake/Output:   Intake/Output Summary (Last 24 hours) at 10/04/2022 0831 Last data filed at 10/04/2022 0457 Gross per 24 hour  Intake --  Output 2580 ml  Net -2580 ml     Physical Exam    General: Well appearing this am. NAD.  HEENT: Normal. Neck: Supple, JVP 10 cm. Carotids OK.  Cardiac:  Mechanical heart sounds with LVAD hum present.  Lungs:  CTAB, normal effort.  Abdomen:  NT, ND, no HSM. No bruits or masses. +BS  LVAD exit site: Well-healed and incorporated. Dressing dry and intact. No erythema or drainage. Stabilization device present and accurately applied. Driveline dressing changed daily per sterile technique. Extremities:  Warm and dry. No cyanosis, clubbing, rash, or edema.  Neuro:  Alert & oriented x 3. Cranial nerves grossly intact. Moves all 4 extremities w/o difficulty. Affect pleasant     Telemetry    SR 90s with occasional PVCs (personally reviewed).    EKG    N/A   Labs   Basic Metabolic Panel: Recent Labs  Lab 09/28/22 0505 09/29/22 0356 09/29/22 0743 09/30/22 0417 10/01/22 0453 10/02/22 0336 10/03/22 0403 10/04/22 0246  NA 132*  --    < > 134* 135 135 134* 136  K 3.6  --    < > 3.3* 3.6 4.0 3.8 4.4  CL 97*  --    < > 94* 93* 95* 93* 95*  CO2 24  --    < > '28 29 30 29 28  '$ GLUCOSE 141*  --    < > 87 127* 119* 141* 100*  BUN 27*  --    < > '14 13 15 17 17  '$ CREATININE 1.27*  --    < > 1.02 0.99 1.14 0.97 1.02  CALCIUM 8.1*  --    < > 8.3* 8.4* 8.4* 8.5* 8.8*  MG 2.0 2.0  --  1.8 1.9  --  1.9 2.5*  PHOS 2.1* 1.9*  --  2.7 2.8  --   --   --    < > = values in this interval not displayed.    Liver Function Tests: Recent Labs  Lab 09/28/22 1129  AST 28  ALT 12  ALKPHOS 57  BILITOT 0.6  PROT 6.1*  ALBUMIN 3.1*   No results for input(s): "LIPASE", "AMYLASE" in  the last 168 hours. No results for input(s): "AMMONIA" in the last 168 hours.  CBC: Recent Labs  Lab 09/28/22 0505 09/29/22 0356 09/30/22 0417 10/01/22 0453 10/02/22 0828 10/03/22 0403 10/04/22 0246  WBC 8.8 7.9 6.4 7.1 10.2 9.4 9.4  NEUTROABS 7.3 6.7 5.1 5.6 9.0*  --   --   HGB 8.8* 8.5* 9.1* 9.1* 9.7* 9.3* 9.6*  HCT 26.2* 24.7* 26.7* 28.0* 29.1* 28.1* 28.7*  MCV 92.6 90.8 89.9 92.4 91.8 91.8 91.1  PLT 137* 168 195 229 248 260 281    INR: Recent Labs  Lab 09/30/22 0417 10/01/22 0453 10/02/22 0336 10/03/22 0403 10/04/22 0246  INR 3.5* 2.7* 2.0* 2.1* 1.6*    Other results: EKG:    Imaging   ECHOCARDIOGRAM LIMITED  Result Date: 10/03/2022    ECHOCARDIOGRAM REPORT   Patient Name:   Ricardo Zuniga Date of Exam: 10/03/2022 Medical Rec #:  [id number]        Height:       69.0 in Accession #:    [id number]       Weight:       183.6 lb Date of Birth:  1951/01/11        BSA:          1.992 m Patient Age:    36 years         BP:           96/83 mmHg Patient Gender: M                HR:            101 bpm. Exam Location:  Inpatient Procedure: Limited Echo, Cardiac Doppler and Limited Color Doppler Indications:    RAMP study for LVAD.  History:        Patient has prior history of Echocardiogram examinations, most                 recent 10/01/2022. Risk Factors:Diabetes, Hypertension and                 Dyslipidemia.  Sonographer:    Johny Chess RDCS Referring Phys: [place]  1. A HeartMate III LVAD is in place         Baseline Speed: 5400RPM     LVIDd: 5.6cm     Septum: Midline     AoV: Opens intermittently     RV: Moderately enlarged with moderately reduced RV systolic function     MV: Trivial MR         Speed: 5500rpm     LVIDd: 5.5cm     Septum: Midline     AoV: Opens (partially) intermittently, no AR     RV: Moderately enlarged with moderately reduced RV systolic function     MV: Trivial MR. Left ventricular ejection fraction, by estimation, is <20%. The left ventricle has severely decreased function. The left ventricle demonstrates global hypokinesis.  2. Right ventricular systolic function is moderately reduced. The right ventricular size is moderately enlarged. There appears to be fibrinous material along the RV free wall that is more prevalent on current study when compared to prior. There is no evidence of RV collapse.  3. Left atrial size was moderately dilated.  4. Right atrial size was moderately dilated.  5. The mitral valve is grossly normal. Trivial mitral valve regurgitation.  6. The aortic valve is tricuspid. There is mild calcification of the aortic valve. There is mild thickening of the aortic valve. Aortic  valve regurgitation is not visualized. Aortic valve sclerosis/calcification is present, without any evidence of aortic stenosis. FINDINGS  Left Ventricle: A HeartMate III LVAD is in place Baseline Speed: 5400RPM LVIDd: 5.6cm Septum: Midline AoV: Opens intermittently RV: Moderately enlarged with moderately reduced RV systolic function MV: Trivial MR  Speed: 5500rpm LVIDd: 5.5cm Septum: Midline AoV: Opens (partially) intermittently, no AR RV: Moderately enlarged with moderately reduced RV systolic function MV: Trivial MR. Left ventricular ejection fraction, by estimation, is <20%. The left ventricle has severely decreased function. The left ventricle demonstrates global hypokinesis. The left ventricular internal cavity size was normal in size. There is no left ventricular hypertrophy. Right Ventricle: The right ventricular size is moderately enlarged. No increase in right ventricular wall thickness. Right ventricular systolic function is moderately reduced. Left Atrium: Left atrial size was moderately dilated. Right Atrium: Right atrial size was moderately dilated. Mitral Valve: The mitral valve is grossly normal. Trivial mitral valve regurgitation. Tricuspid Valve: The tricuspid valve is normal in structure. Tricuspid valve regurgitation is mild. Aortic Valve: The aortic valve is tricuspid. There is mild calcification of the aortic valve. There is mild thickening of the aortic valve. Aortic valve regurgitation is not visualized. Aortic valve sclerosis/calcification is present, without any evidence of aortic stenosis. Pulmonic Valve: The pulmonic valve was not assessed. Aorta: The aortic root is normal in size and structure. IAS/Shunts: The atrial septum is grossly normal. Additional Comments: There is a small pleural effusion in the right lateral region. Gwyndolyn Kaufman MD Electronically signed by Gwyndolyn Kaufman MD Signature Date/Time: 10/03/2022/3:45:35 PM    Final    DG CHEST PORT 1 VIEW  Result Date: 10/03/2022 CLINICAL DATA:  Pleural effusion EXAM: PORTABLE CHEST 1 VIEW COMPARISON:  Portable exam 1518 hours compared to 09/29/2022 FINDINGS: LVAD projects over cardiac apex. Enlargement of cardiac silhouette with vascular congestion. Atherosclerotic calcification aorta. Bibasilar atelectasis and slight chronic accentuation of LEFT mid lung markings. No  definite pulmonary infiltrate, pleural effusion or pneumothorax. Osseous demineralization. IMPRESSION: Enlargement of cardiac silhouette with LVAD. Minimal bibasilar atelectasis. Electronically Signed   By: Lavonia Dana M.D.   On: 10/03/2022 15:26   CT HEAD WO CONTRAST (5MM)  Result Date: 10/03/2022 CLINICAL DATA:  72 year old male with delirium. LVAD, anticoagulation. EXAM: CT HEAD WITHOUT CONTRAST TECHNIQUE: Contiguous axial images were obtained from the base of the skull through the vertex without intravenous contrast. RADIATION DOSE REDUCTION: This exam was performed according to the departmental dose-optimization program which includes automated exposure control, adjustment of the mA and/or kV according to patient size and/or use of iterative reconstruction technique. COMPARISON:  Face CT 10/30/2021. FINDINGS: Brain: Chronic appearing right cerebellum PICA infarct in an area which was not completely visible last year. Background brain volume appears normal for age. No midline shift, ventriculomegaly, mass effect, evidence of mass lesion, intracranial hemorrhage or evidence of cortically based acute infarction. No other encephalomalacia identified and otherwise gray-white differentiation within normal limits for age. Vascular: Extensive Calcified atherosclerosis at the skull base. No suspicious intracranial vascular hyperdensity. Skull: Negative. Sinuses/Orbits: Visualized paranasal sinuses and mastoids are clear. Other: No acute orbit or scalp soft tissue finding. IMPRESSION: Chronic Right cerebellum PICA infarct but No acute intracranial abnormality. Electronically Signed   By: Genevie Ann M.D.   On: 10/03/2022 10:48   [place] EKG SITE RITE  Result Date: 10/03/2022 If [facility] image not attached, placement could not be confirmed due to current cardiac rhythm.    Medications:     Scheduled Medications:  amiodarone  200  mg Oral BID   aspirin EC  81 mg Oral Daily   atorvastatin  40 mg Oral Daily    Chlorhexidine Gluconate Cloth  6 each Topical Daily   digoxin  0.0625 mg Oral Daily   docusate sodium  200 mg Oral BID   empagliflozin  10 mg Oral Daily   feeding supplement  237 mL Oral TID BM   furosemide  40 mg Intravenous BID   insulin aspart  0-24 Units Subcutaneous Q4H   metoCLOPramide (REGLAN) injection  5 mg Intravenous Q8H   pantoprazole  40 mg Oral Daily   polyethylene glycol  17 g Oral Daily   senna  1 tablet Oral Daily   sodium chloride flush  10-40 mL Intracatheter Q12H   sodium chloride flush  3 mL Intravenous Q12H   spironolactone  25 mg Oral Daily   Warfarin - Pharmacist Dosing Inpatient   Does not apply q1600    Infusions:  sodium chloride     sodium chloride     sodium chloride Stopped (09/30/22 1155)    PRN Medications: Place/Maintain arterial line **AND** sodium chloride, sodium chloride, acetaminophen, albuterol, dextrose, morphine injection, ondansetron (ZOFRAN) IV, mouth rinse, oxyCODONE, sodium chloride flush, sodium chloride flush, traMADol, traZODone   Patient Profile  72 y/o male w/ long standing h/o chronic systolic heart failure due to nonischemic cardiomyopathy, T2DM and hx of acute promyelocytic leukemia s/p ATRA ([facility] 2014). Recent development of rapidly progressive heart failure,  NYHA class IIIb-IV symptoms. Admitted for for RHC and optimization prior to VAD implant.   RHC w/ elevated filling pressures and low output (RA 13, PAP 64/26 (40), PCWP 23, CI 2 L/min/m2) =>started on Milrinone  2/14 S/P HMIII VAD   Assessment/Plan:    1. Chronic HFrEF, NICM -> 09/24/22 S/P HMIII LVAD: Off milrinone. Speed increased to 5400 after 2/21 echo and to 5500 after 2/23 echo.  Good flow at 4.3. Central line out.  Mild JVD.  INR 1.6, LDH 192.   - Continue Lasix 40 mg IV bid for 1 more day, likely to po tomorrow.    - Continue spironolactone 25 daily.  - Continue Jardiance.  - Coumadin per pharmacy, increase dose today (discussed with pharmacist).  2. Acute  blood loss anemia: Hgb stable, 9.6 3. NSVT/PVCS: Rare PVCs. - Decrease amiodarone to 200 mg daily.   4.  Hx of promyelocytic leukemia: S/P ATRA in 2014 at [facility].  5.  Hx of endocarditis: 2015 6.  Nausea/poor appetite: Improved.  7. Altered mental status: Noted 2/23, delirium (no sleep x 2 days).  CT head without acute changes.  Much clearer this morning after good sleep.    May go to [facility].   Loralie Champagne 10/04/2022 8:31 AM

## 2022-10-04 NOTE — Plan of Care (Signed)
Pt transferring out of ICU We will sign off   Eliseo Gum MSN, AGACNP-BC [place] 10/04/2022, 9:32 AM

## 2022-10-05 DIAGNOSIS — I5023 Acute on chronic systolic (congestive) heart failure: Secondary | ICD-10-CM | POA: Diagnosis not present

## 2022-10-05 LAB — CBC
HCT: 26.8 % — ABNORMAL LOW (ref 39.0–52.0)
Hemoglobin: 8.9 g/dL — ABNORMAL LOW (ref 13.0–17.0)
MCH: 30.3 pg (ref 26.0–34.0)
MCHC: 33.2 g/dL (ref 30.0–36.0)
MCV: 91.2 fL (ref 80.0–100.0)
Platelets: 284 10*3/uL (ref 150–400)
RBC: 2.94 MIL/uL — ABNORMAL LOW (ref 4.22–5.81)
RDW: 16.4 % — ABNORMAL HIGH (ref 11.5–15.5)
WBC: 8.7 10*3/uL (ref 4.0–10.5)
nRBC: 0 % (ref 0.0–0.2)

## 2022-10-05 LAB — LACTATE DEHYDROGENASE: LDH: 191 U/L (ref 98–192)

## 2022-10-05 LAB — PROTIME-INR
INR: 1.5 — ABNORMAL HIGH (ref 0.8–1.2)
Prothrombin Time: 18 seconds — ABNORMAL HIGH (ref 11.4–15.2)

## 2022-10-05 LAB — BASIC METABOLIC PANEL
Anion gap: 12 (ref 5–15)
BUN: 17 mg/dL (ref 8–23)
CO2: 27 mmol/L (ref 22–32)
Calcium: 8.3 mg/dL — ABNORMAL LOW (ref 8.9–10.3)
Chloride: 93 mmol/L — ABNORMAL LOW (ref 98–111)
Creatinine, Ser: 1.22 mg/dL (ref 0.61–1.24)
GFR, Estimated: >60 mL/min (ref >60)
Glucose, Bld: 142 mg/dL — ABNORMAL HIGH (ref 70–99)
Potassium: 4.1 mmol/L (ref 3.5–5.1)
Sodium: 132 mmol/L — ABNORMAL LOW (ref 135–145)

## 2022-10-05 LAB — GLUCOSE, CAPILLARY
Glucose-Capillary: 138 mg/dL — ABNORMAL HIGH (ref 70–99)
Glucose-Capillary: 131 mg/dL — ABNORMAL HIGH (ref 70–99)
Glucose-Capillary: 269 mg/dL — ABNORMAL HIGH (ref 70–99)
Glucose-Capillary: 122 mg/dL — ABNORMAL HIGH (ref 70–99)
Glucose-Capillary: 288 mg/dL — ABNORMAL HIGH (ref 70–99)

## 2022-10-05 LAB — MAGNESIUM: Magnesium: 2.2 mg/dL (ref 1.7–2.4)

## 2022-10-05 MED ORDER — INSULIN ASPART 100 UNIT/ML IJ SOLN: 0.0000 [IU] | INTRAMUSCULAR | Status: DC

## 2022-10-05 MED ORDER — WARFARIN SODIUM 5 MG PO TABS
5.0000 mg | ORAL_TABLET | Freq: Once | ORAL | Status: AC
  Administered 2022-10-05: 5 mg via ORAL
  Filled 2022-10-05: qty 1

## 2022-10-05 MED ORDER — INSULIN ASPART 100 UNIT/ML IJ SOLN
0.0000 [IU] | Freq: Three times a day (TID) | INTRAMUSCULAR | Status: DC
  Administered 2022-10-06: 8 [IU] via SUBCUTANEOUS
  Administered 2022-10-06: 2 [IU] via SUBCUTANEOUS
  Administered 2022-10-06: 4 [IU] via SUBCUTANEOUS
  Administered 2022-10-07: 2 [IU] via SUBCUTANEOUS

## 2022-10-05 NOTE — Progress Notes (Signed)
     [address]       [place] [address]             [phone]       No events Vitals:   10/05/22 0735 10/05/22 0803  BP:  90/79  Pulse:    Resp:  (!) 22  Temp: 98.6 F (37 C)   SpO2:  96%   Alert NAD Sinus LVAD INTERROGATION:  HeartMate III LVAD:   Flow 4.2 liters/min, speed 5500, power 4, PI 3.  EWOB  S/P HMII LVAD Plan for discharge tomorrow

## 2022-10-05 NOTE — Plan of Care (Signed)
  Problem: Education: Goal: Understanding of CV disease, CV risk reduction, and recovery process will improve Outcome: Completed/Met Goal: Individualized Educational Video(s) Outcome: Completed/Met   Problem: Activity: Goal: Ability to return to baseline activity level will improve Outcome: Completed/Met   Problem: Cardiovascular: Goal: Ability to achieve and maintain adequate cardiovascular perfusion will improve Outcome: Completed/Met Goal: Vascular access site(s) Level 0-1 will be maintained Outcome: Completed/Met   Problem: Health Behavior/Discharge Planning: Goal: Ability to safely manage health-related needs after discharge will improve Outcome: Completed/Met   Problem: Activity: Goal: Risk for activity intolerance will decrease Outcome: Progressing   Problem: Cardiac: Goal: Ability to maintain an adequate cardiac output will improve Outcome: Progressing   Problem: Coping: Goal: Level of anxiety will decrease Outcome: Progressing   Problem: Fluid Volume: Goal: Risk for excess fluid volume will decrease Outcome: Progressing   Problem: Clinical Measurements: Goal: Ability to maintain clinical measurements within normal limits will improve Outcome: Progressing Goal: Will remain free from infection Outcome: Progressing

## 2022-10-05 NOTE — Progress Notes (Signed)
Patient ID: Ricardo Zuniga, male   DOB: 08/14/1950, 72 y.o.   MRN: [id number]   Advanced Heart Failure VAD Team Note  PCP-Cardiologist: [name], MD   Subjective:    2/15 S/P HMIII LVAD.  2/22 Echo: Speed increased to 5400 rpm.  Moderate RV enlargement and dysfunction. Organized pericardial effusion.  2/24 Echo: Speed increased to 5500 rpm.  Moderate RVE/moderate dysfunction.  Organized pericardial effusion adjacent to RV without hemodynamic effect.   Central line out, no co-ox today.  Good diuresis, weight down 4 lbs. MAP 70s-80s.   Eating better, walking, sleeping well.  No complaints.      INR 1.5, Hgb 8.9 LDH 213 => 183 => 208 =>198 => 192 => 191  LVAD INTERROGATION:  HeartMate III LVAD:   Flow 4.2 liters/min, speed 5500, power 4, PI 3.    Objective:    Vital Signs:   Temp:  [97.8 F (36.6 C)-98.6 F (37 C)] 98.6 F (37 C) (02/25 0735) Pulse Rate:  [94-207] 207 (02/24 1810) Resp:  [16-27] 22 (02/25 0803) BP: (79-140)/(52-99) 90/79 (02/25 0803) SpO2:  [82 %-97 %] 96 % (02/25 0803) Weight:  [78.8 kg-80.6 kg] 78.8 kg (02/25 0500) Last BM Date : 10/04/22 Mean arterial Pressure  70s-80s Intake/Output:   Intake/Output Summary (Last 24 hours) at 10/05/2022 0825 Last data filed at 10/05/2022 0600 Gross per 24 hour  Intake 370 ml  Output 2965 ml  Net -2595 ml     Physical Exam    General: Well appearing this am. NAD.  HEENT: Normal. Neck: Supple, JVP 7-8 cm. Carotids OK.  Cardiac:  Mechanical heart sounds with LVAD hum present.  Lungs:  CTAB, normal effort.  Abdomen:  NT, ND, no HSM. No bruits or masses. +BS  LVAD exit site: Well-healed and incorporated. Dressing dry and intact. No erythema or drainage. Stabilization device present and accurately applied. Driveline dressing changed daily per sterile technique. Extremities:  Warm and dry. No cyanosis, clubbing, rash, or edema.  Neuro:  Alert & oriented x 3. Cranial nerves grossly intact. Moves all 4 extremities  w/o difficulty. Affect pleasant     Telemetry   SR 90s with occasional PVCs (personally reviewed).    EKG    N/A   Labs   Basic Metabolic Panel: Recent Labs  Lab 09/29/22 0356 09/29/22 0743 09/30/22 0417 10/01/22 0453 10/02/22 0336 10/03/22 0403 10/04/22 0246 10/05/22 0220  NA  --    < > 134* 135 135 134* 136 132*  K  --    < > 3.3* 3.6 4.0 3.8 4.4 4.1  CL  --    < > 94* 93* 95* 93* 95* 93*  CO2  --    < > '28 29 30 29 28 27  '$ GLUCOSE  --    < > 87 127* 119* 141* 100* 142*  BUN  --    < > '14 13 15 17 17 17  '$ CREATININE  --    < > 1.02 0.99 1.14 0.97 1.02 1.22  CALCIUM  --    < > 8.3* 8.4* 8.4* 8.5* 8.8* 8.3*  MG 2.0  --  1.8 1.9  --  1.9 2.5* 2.2  PHOS 1.9*  --  2.7 2.8  --   --   --   --    < > = values in this interval not displayed.    Liver Function Tests: Recent Labs  Lab 09/28/22 1129  AST 28  ALT 12  ALKPHOS 57  BILITOT 0.6  PROT 6.1*  ALBUMIN 3.1*   No results for input(s): "LIPASE", "AMYLASE" in the last 168 hours. No results for input(s): "AMMONIA" in the last 168 hours.  CBC: Recent Labs  Lab 09/29/22 0356 09/30/22 0417 10/01/22 0453 10/02/22 0828 10/03/22 0403 10/04/22 0246 10/05/22 0220  WBC 7.9 6.4 7.1 10.2 9.4 9.4 8.7  NEUTROABS 6.7 5.1 5.6 9.0*  --   --   --   HGB 8.5* 9.1* 9.1* 9.7* 9.3* 9.6* 8.9*  HCT 24.7* 26.7* 28.0* 29.1* 28.1* 28.7* 26.8*  MCV 90.8 89.9 92.4 91.8 91.8 91.1 91.2  PLT 168 195 229 248 260 281 284    INR: Recent Labs  Lab 10/01/22 0453 10/02/22 0336 10/03/22 0403 10/04/22 0246 10/05/22 0220  INR 2.7* 2.0* 2.1* 1.6* 1.5*    Other results: EKG:    Imaging   ECHOCARDIOGRAM LIMITED  Result Date: 10/03/2022    ECHOCARDIOGRAM REPORT   Patient Name:   Ricardo Zuniga Date of Exam: 10/03/2022 Medical Rec #:  [id number]        Height:       69.0 in Accession #:    [id number]       Weight:       183.6 lb Date of Birth:  Jun 06, 1951        BSA:          1.992 m Patient Age:    17 years         BP:            96/83 mmHg Patient Gender: M                HR:           101 bpm. Exam Location:  Inpatient Procedure: Limited Echo, Cardiac Doppler and Limited Color Doppler Indications:    RAMP study for LVAD.  History:        Patient has prior history of Echocardiogram examinations, most                 recent 10/01/2022. Risk Factors:Diabetes, Hypertension and                 Dyslipidemia.  Sonographer:    Johny Chess RDCS Referring Phys: [place]  1. A HeartMate III LVAD is in place         Baseline Speed: 5400RPM     LVIDd: 5.6cm     Septum: Midline     AoV: Opens intermittently     RV: Moderately enlarged with moderately reduced RV systolic function     MV: Trivial MR         Speed: 5500rpm     LVIDd: 5.5cm     Septum: Midline     AoV: Opens (partially) intermittently, no AR     RV: Moderately enlarged with moderately reduced RV systolic function     MV: Trivial MR. Left ventricular ejection fraction, by estimation, is <20%. The left ventricle has severely decreased function. The left ventricle demonstrates global hypokinesis.  2. Right ventricular systolic function is moderately reduced. The right ventricular size is moderately enlarged. There appears to be fibrinous material along the RV free wall that is more prevalent on current study when compared to prior. There is no evidence of RV collapse.  3. Left atrial size was moderately dilated.  4. Right atrial size was moderately dilated.  5. The mitral valve is grossly normal. Trivial mitral valve regurgitation.  6. The aortic valve is tricuspid. There  is mild calcification of the aortic valve. There is mild thickening of the aortic valve. Aortic valve regurgitation is not visualized. Aortic valve sclerosis/calcification is present, without any evidence of aortic stenosis. FINDINGS  Left Ventricle: A HeartMate III LVAD is in place Baseline Speed: 5400RPM LVIDd: 5.6cm Septum: Midline AoV: Opens intermittently RV: Moderately enlarged with  moderately reduced RV systolic function MV: Trivial MR Speed: 5500rpm LVIDd: 5.5cm Septum: Midline AoV: Opens (partially) intermittently, no AR RV: Moderately enlarged with moderately reduced RV systolic function MV: Trivial MR. Left ventricular ejection fraction, by estimation, is <20%. The left ventricle has severely decreased function. The left ventricle demonstrates global hypokinesis. The left ventricular internal cavity size was normal in size. There is no left ventricular hypertrophy. Right Ventricle: The right ventricular size is moderately enlarged. No increase in right ventricular wall thickness. Right ventricular systolic function is moderately reduced. Left Atrium: Left atrial size was moderately dilated. Right Atrium: Right atrial size was moderately dilated. Mitral Valve: The mitral valve is grossly normal. Trivial mitral valve regurgitation. Tricuspid Valve: The tricuspid valve is normal in structure. Tricuspid valve regurgitation is mild. Aortic Valve: The aortic valve is tricuspid. There is mild calcification of the aortic valve. There is mild thickening of the aortic valve. Aortic valve regurgitation is not visualized. Aortic valve sclerosis/calcification is present, without any evidence of aortic stenosis. Pulmonic Valve: The pulmonic valve was not assessed. Aorta: The aortic root is normal in size and structure. IAS/Shunts: The atrial septum is grossly normal. Additional Comments: There is a small pleural effusion in the right lateral region. Gwyndolyn Kaufman MD Electronically signed by Gwyndolyn Kaufman MD Signature Date/Time: 10/03/2022/3:45:35 PM    Final    DG CHEST PORT 1 VIEW  Result Date: 10/03/2022 CLINICAL DATA:  Pleural effusion EXAM: PORTABLE CHEST 1 VIEW COMPARISON:  Portable exam 1518 hours compared to 09/29/2022 FINDINGS: LVAD projects over cardiac apex. Enlargement of cardiac silhouette with vascular congestion. Atherosclerotic calcification aorta. Bibasilar atelectasis and  slight chronic accentuation of LEFT mid lung markings. No definite pulmonary infiltrate, pleural effusion or pneumothorax. Osseous demineralization. IMPRESSION: Enlargement of cardiac silhouette with LVAD. Minimal bibasilar atelectasis. Electronically Signed   By: Lavonia Dana M.D.   On: 10/03/2022 15:26   CT HEAD WO CONTRAST (5MM)  Result Date: 10/03/2022 CLINICAL DATA:  72 year old male with delirium. LVAD, anticoagulation. EXAM: CT HEAD WITHOUT CONTRAST TECHNIQUE: Contiguous axial images were obtained from the base of the skull through the vertex without intravenous contrast. RADIATION DOSE REDUCTION: This exam was performed according to the departmental dose-optimization program which includes automated exposure control, adjustment of the mA and/or kV according to patient size and/or use of iterative reconstruction technique. COMPARISON:  Face CT 10/30/2021. FINDINGS: Brain: Chronic appearing right cerebellum PICA infarct in an area which was not completely visible last year. Background brain volume appears normal for age. No midline shift, ventriculomegaly, mass effect, evidence of mass lesion, intracranial hemorrhage or evidence of cortically based acute infarction. No other encephalomalacia identified and otherwise gray-white differentiation within normal limits for age. Vascular: Extensive Calcified atherosclerosis at the skull base. No suspicious intracranial vascular hyperdensity. Skull: Negative. Sinuses/Orbits: Visualized paranasal sinuses and mastoids are clear. Other: No acute orbit or scalp soft tissue finding. IMPRESSION: Chronic Right cerebellum PICA infarct but No acute intracranial abnormality. Electronically Signed   By: Genevie Ann M.D.   On: 10/03/2022 10:48     Medications:     Scheduled Medications:  amiodarone  200 mg Oral Daily   aspirin EC  81  mg Oral Daily   atorvastatin  40 mg Oral Daily   Chlorhexidine Gluconate Cloth  6 each Topical Daily   digoxin  0.0625 mg Oral Daily    docusate sodium  200 mg Oral BID   empagliflozin  10 mg Oral Daily   feeding supplement  237 mL Oral TID BM   furosemide  40 mg Intravenous BID   insulin aspart  0-24 Units Subcutaneous Q4H   metoCLOPramide (REGLAN) injection  5 mg Intravenous Q8H   pantoprazole  40 mg Oral Daily   polyethylene glycol  17 g Oral Daily   senna  1 tablet Oral Daily   sodium chloride flush  10-40 mL Intracatheter Q12H   sodium chloride flush  3 mL Intravenous Q12H   spironolactone  25 mg Oral Daily   warfarin  5 mg Oral ONCE-1600   Warfarin - Pharmacist Dosing Inpatient   Does not apply q1600    Infusions:  sodium chloride     sodium chloride     sodium chloride Stopped (09/30/22 1155)    PRN Medications: Place/Maintain arterial line **AND** sodium chloride, sodium chloride, acetaminophen, albuterol, dextrose, morphine injection, ondansetron (ZOFRAN) IV, mouth rinse, oxyCODONE, sodium chloride flush, sodium chloride flush, traMADol, traZODone   Patient Profile  72 y/o male w/ long standing h/o chronic systolic heart failure due to nonischemic cardiomyopathy, T2DM and hx of acute promyelocytic leukemia s/p ATRA ([facility] 2014). Recent development of rapidly progressive heart failure,  NYHA class IIIb-IV symptoms. Admitted for for RHC and optimization prior to VAD implant.   RHC w/ elevated filling pressures and low output (RA 13, PAP 64/26 (40), PCWP 23, CI 2 L/min/m2) =>started on Milrinone  2/14 S/P HMIII VAD   Assessment/Plan:    1. Chronic HFrEF, NICM -> 09/24/22 S/P HMIII LVAD: Off milrinone. Speed increased to 5400 after 2/21 echo and to 5500 after 2/23 echo.  Good flow at 4.2. Central line out.  Not significantly volume overloaded and weight below baseline.  INR 1.5, LDH 191.   - Stop Lasix today (had 1 IV dose this morning). Reassess need for po Lasix tomorrow.    - Continue spironolactone 25 daily.  - Continue Jardiance.  - Coumadin per pharmacy, increase dose today (discussed with  pharmacist).  2. Acute blood loss anemia: Hgb 8.9 3. NSVT/PVCS: Rare PVCs. - Decrease amiodarone to 200 mg daily.   4.  Hx of promyelocytic leukemia: S/P ATRA in 2014 at [facility].  5.  Hx of endocarditis: 2015 6.  Nausea/poor appetite: Improved.  7. Altered mental status: Noted 2/23, delirium (no sleep x 2 days).  CT head without acute changes.  Much clearer this morning after good sleep.    Has orders for 2C.  Suspect he can go home tomorrow.   Loralie Champagne 10/05/2022 8:25 AM

## 2022-10-05 NOTE — Progress Notes (Addendum)
ANTICOAGULATION CONSULT NOTE  Pharmacy Consult for Coumadin Indication:  LVAD  Allergies  Allergen Reactions   Zestril [Lisinopril] Cough    Patient Measurements: Height: '5\' 9"'$  (175.3 cm) Weight: 78.8 kg (173 lb 11.6 oz) IBW/kg (Calculated) : 70.7  Vital Signs: Temp: 98.3 F (36.8 C) (02/25 0349) Temp Source: Oral (02/25 0349) BP: 105/56 (02/25 0600)  Labs: Recent Labs    10/03/22 0403 10/04/22 0246 10/05/22 0220  HGB 9.3* 9.6* 8.9*  HCT 28.1* 28.7* 26.8*  PLT 260 281 284  LABPROT 23.0* 19.0* 18.0*  INR 2.1* 1.6* 1.5*  CREATININE 0.97 1.02 1.22     Estimated Creatinine Clearance: 54.7 mL/min (by C-G formula based on SCr of 1.22 mg/dL).   Medical History: Past Medical History:  Diagnosis Date   Anal fissure    APML (acute promyelocytic leukemia) in remission ([place])    Completed treatment 04/2013   CHF (congestive heart failure) ([facility])    Coronary artery disease    Nonobstructive at cardiac catheterization 09/2018   Difficult intubation    w/shoulder surgery at surgical center between 2005-2008   Gastroesophageal reflux disease    History of blood transfusion 2014   History of kidney stones    passed stones   Hypercholesteremia    Neuropathy    bilateral feet   Nonischemic cardiomyopathy ([place])    a. EF 20% in 2015 - thought to possibly be viral induced or chemotherapy induced from treatments in 2014 b. at 35-40% by echo in 05/2016    Peptic ulcer disease    Pneumonia    Yrs ago   Type 2 diabetes mellitus ([place])    Ureteral colic    Vertigo    tx with valium prn   Wears glasses    Wears hearing aid in both ears     Assessment: 72 yo male s/p LVAD with HM3 on 2/14.  Warfarin '5mg'$  qd initiated post op 2/16 with quick INR jump to 6.7.  Doses held 2/18-2/21, reinitiated low dose  2/22.  INR now down 1.5 now that pt stable and increased po intake. Will need to increase warfarin doses to trend towards goal. CBC stable, LDH stable approx 200 no overt bleeding  or complications noted.  Goal of Therapy:  INR 2-2.5 Monitor platelets by anticoagulation protocol: Yes   Plan:  Coumadin 5 mg po x 1 tonight. Daily INR.  Bonnita Nasuti Pharm.D. CPP, BCPS Clinical Pharmacist [phone] 10/05/2022 7:33 AM   [facility] pharmacy phone numbers are listed on [email or web address]

## 2022-10-06 DIAGNOSIS — I5023 Acute on chronic systolic (congestive) heart failure: Secondary | ICD-10-CM | POA: Diagnosis not present

## 2022-10-06 LAB — CBC
HCT: 27.1 % — ABNORMAL LOW (ref 39.0–52.0)
Hemoglobin: 9 g/dL — ABNORMAL LOW (ref 13.0–17.0)
MCH: 30.3 pg (ref 26.0–34.0)
MCHC: 33.2 g/dL (ref 30.0–36.0)
MCV: 91.2 fL (ref 80.0–100.0)
Platelets: 308 10*3/uL (ref 150–400)
RBC: 2.97 MIL/uL — ABNORMAL LOW (ref 4.22–5.81)
RDW: 16 % — ABNORMAL HIGH (ref 11.5–15.5)
WBC: 8 10*3/uL (ref 4.0–10.5)
nRBC: 0 % (ref 0.0–0.2)

## 2022-10-06 LAB — PROTIME-INR
INR: 1.6 — ABNORMAL HIGH (ref 0.8–1.2)
Prothrombin Time: 18.9 seconds — ABNORMAL HIGH (ref 11.4–15.2)

## 2022-10-06 LAB — GLUCOSE, CAPILLARY
Glucose-Capillary: 163 mg/dL — ABNORMAL HIGH (ref 70–99)
Glucose-Capillary: 226 mg/dL — ABNORMAL HIGH (ref 70–99)
Glucose-Capillary: 142 mg/dL — ABNORMAL HIGH (ref 70–99)
Glucose-Capillary: 168 mg/dL — ABNORMAL HIGH (ref 70–99)

## 2022-10-06 LAB — BASIC METABOLIC PANEL
Anion gap: 10 (ref 5–15)
BUN: 19 mg/dL (ref 8–23)
CO2: 27 mmol/L (ref 22–32)
Calcium: 8.4 mg/dL — ABNORMAL LOW (ref 8.9–10.3)
Chloride: 97 mmol/L — ABNORMAL LOW (ref 98–111)
Creatinine, Ser: 1.09 mg/dL (ref 0.61–1.24)
GFR, Estimated: >60 mL/min (ref >60)
Glucose, Bld: 74 mg/dL (ref 70–99)
Potassium: 3.9 mmol/L (ref 3.5–5.1)
Sodium: 134 mmol/L — ABNORMAL LOW (ref 135–145)

## 2022-10-06 LAB — LACTATE DEHYDROGENASE: LDH: 180 U/L (ref 98–192)

## 2022-10-06 LAB — MAGNESIUM: Magnesium: 2.3 mg/dL (ref 1.7–2.4)

## 2022-10-06 MED ORDER — WARFARIN SODIUM 5 MG PO TABS
5.0000 mg | ORAL_TABLET | Freq: Once | ORAL | Status: AC
  Administered 2022-10-06: 5 mg via ORAL
  Filled 2022-10-06: qty 1

## 2022-10-06 NOTE — TOC CM/SW Note (Addendum)
Patient will need a bedside commode due to Heart Failure and increased frequency to use bathroom due to HF medication.     [place], Heart Failure TOC CM [phone]

## 2022-10-06 NOTE — Progress Notes (Signed)
Physical Therapy Treatment Patient Details Name: Ricardo Zuniga MRN: [id number] DOB: 12/03/1950 Today's Date: 10/06/2022   History of Present Illness 72 yo M admitted 09/22/22  for RHC and optimization prior to LVAD HM3 implantation 09/24/22. 2/15 extubated. PMH: HFrEF, HTN, HLD, GERD, leukemia, NICM due to chemotherapy, multiple back surgeries, neck fusion, DM, neuropathy, shoulder surgery x 3.    PT Comments    Pt pleasant and eager to return home. Pt needing cues only for parts of backup bag, hand placement and to recall all sternal precautions. Pt able to transition to battery without physical assist. Pt performed stairs, hall ambulation and successful transfers.   HR 90-107 Sped 5500, flow 4.3, PI 3.1, power 4.0   Recommendations for follow up therapy are one component of a multi-disciplinary discharge planning process, led by the attending physician.  Recommendations may be updated based on patient status, additional functional criteria and insurance authorization.  Follow Up Recommendations  Home health PT     Assistance Recommended at Discharge Intermittent Supervision/Assistance  Patient can return home with the following Help with stairs or ramp for entrance;Assist for transportation;Assistance with cooking/housework;A little help with walking and/or transfers;A little help with bathing/dressing/bathroom   Equipment Recommendations  Rollator (4 wheels);BSC/3in1    Recommendations for Other Services       Precautions / Restrictions Precautions Precautions: Sternal;Fall Precaution Booklet Issued: Yes (comment)     Mobility  Bed Mobility Overal bed mobility: Modified Independent             General bed mobility comments: bed flat without rail    Transfers Overall transfer level: Needs assistance   Transfers: Sit to/from Stand Sit to Stand: Supervision           General transfer comment: cues for hand placement    Ambulation/Gait Ambulation/Gait  assistance: Min guard Gait Distance (Feet): 320 Feet Assistive device: Rolling walker (2 wheels) Gait Pattern/deviations: Decreased stride length, Step-through pattern   Gait velocity interpretation: 1.31 - 2.62 ft/sec, indicative of limited community ambulator   General Gait Details: 2 standing pauses due to fatigue with good speed and stability   Stairs Stairs: Yes Stairs assistance: Min guard Stair Management: Step to pattern, Forwards Number of Stairs: 2     Wheelchair Mobility    Modified Rankin (Stroke Patients Only)       Balance Overall balance assessment: Mild deficits observed, not formally tested                                          Cognition Arousal/Alertness: Awake/alert Behavior During Therapy: WFL for tasks assessed/performed Overall Cognitive Status: Within Functional Limits for tasks assessed                       Memory: Decreased recall of precautions         General Comments: pt able to recall 3/4 restrictions for sternal precautions and only supervision for power exchange this session        Exercises      General Comments        Pertinent Vitals/Pain Pain Assessment Pain Assessment: No/denies pain    Home Living                          Prior Function            PT  Goals (current goals can now be found in the care plan section) Progress towards PT goals: Progressing toward goals    Frequency    Min 3X/week      PT Plan Current plan remains appropriate    Co-evaluation              AM-PAC PT "6 Clicks" Mobility   Outcome Measure  Help needed turning from your back to your side while in a flat bed without using bedrails?: None Help needed moving from lying on your back to sitting on the side of a flat bed without using bedrails?: A Little Help needed moving to and from a bed to a chair (including a wheelchair)?: A Little Help needed standing up from a chair using your  arms (e.g., wheelchair or bedside chair)?: A Little Help needed to walk in hospital room?: A Little Help needed climbing 3-5 steps with a railing? : A Little 6 Click Score: 19    End of Session   Activity Tolerance: Patient tolerated treatment well Patient left: in chair;with call bell/phone within reach Nurse Communication: Mobility status PT Visit Diagnosis: Other abnormalities of gait and mobility (R26.89);Muscle weakness (generalized) (M62.81)     Time: [id number] PT Time Calculation (min) (ACUTE ONLY): 25 min  Charges:  $Gait Training: 8-22 mins $Therapeutic Activity: 8-22 mins                     Bayard Males, PT Acute Rehabilitation Services Office: [place] 10/06/2022, 7:49 AM

## 2022-10-06 NOTE — Progress Notes (Signed)
VAD Discharge Teaching Note:  Discharge VAD teaching completed with patient and wife [name].   The home inspection checklist has been reviewed and no unsafe conditions have been identified. Family reports that there are at least two dedicated grounded, 3-prong outlets with clearly labeled circuit breaker has been established in the bedroom for power module and Charity fundraiser.   Both patient and caregiver have been trained on the following:  1. HM III LVAD overview of system operations  2. Overview of major lifestyle accommodations and cautions   3. Overview of system components (features and functions) 4. Changing power sources 5. Overview of alerts and alarms 6. How to identify and manage an emergency including when pump is running and when pump has stopped  7. Changing system controller 8. Maintain emergency contact list and medications  The patient and caregiver have successfully demonstrated:  1. Changing power source (from batteries to mobile power unit, mobile power unit to batteries, and replacing batteries) 2. Perform system controller self test  3. Check and charge batteries  4. Change system controller 5. Paged VAD pager and programmed number in phones  A daily flow sheet with patient  weight, temperature,  flow, speed, power, and PI, along with daily self checks on system controller and power module have been performed by patient and caregiver during hospitalization and will also be done daily at home.   The caregiver has been trained on percutaneous lead exit site care, care of the driveline and dressing changes. She/he has performed dressing changes during patient's hospitalization under my supervision with the support of the nursing staff. The importance of lead immobilization has been stressed to patient and caregiver using the attachment device. The caregiver has successfully demonstrated the following: 1. Cleansing site with sterile technique 2. Dressing care and  maintenance  3. Immobilizing driveline  The following routine activities and maintenance have been reviewed with patient and caregiver and both verbalize understanding:  1. Stressed importance of never disconnecting power from both controller power leads at the same time, and never disconnecting both batteries at the same time, or the pump will alarm and eventually stop if no power supply restored.  2. Plug the mobile power unit (MPU) and the universal battery charger (UBC) into properly grounded (3 prong) outlets dedicated to PM use. Do NOT use adapter (cheater plug) for ungrounded outlets or multiple portable socket outlets (power strips) 3. Do not connect the PM or MPU to an outlet controlled by wall switch or the device may not work 4. Transfer from MPU to batteries during [facility] mains power failure. The PM has internal backup battery that will power the pump while you transfer to batteries 5. Keep a backup system controller, charged batteries, battery clips, and flashlight near you during sleep in case of electrical power outage 6. Clean battery, battery clip, and universal battery charger contacts weekly 7. Visually inspect percutaneous lead daily 8. Check cables and connectors when changing power source  9. Rotate batteries; keep all eight batteries charged 10. Always have backup system controller, battery clips, fully charged batteries, and spare fully charged batteries when traveling 11. Re-calibrate batteries every 70 uses; monitor battery life of 36 months or 360 uses; replace batteries at end of battery life   Identified the following changes in activities of daily living with pump:  1. No driving for at least six weeks and then only if doctor gives permission to do so 2. No tub baths while pump implanted, and shower only if doctor gives  permission 3. No swimming or submersion in water while implanted with pump 4. Keep all VAD equipment away from water or moisture 5. Keep all VAD  connections clean and dry 6. No contact sports or engage in jumping activities 7. Never have an MRI while implanted with the pump 8. Never leave or store batteries in extremely hot or cold places (such as   trunk of your car), or the battery life will be shortened 9. Call the doctor or hospital contact person if any change in how the pump sounds, feels, or works 10. Plan to sleep only when connected to the mobile power unit. Marland Kitchen 11. Keep a backup system controller, charged batteries, battery clips, and flashlight near you during sleep in case of electrical power outage 12. Do not sleep on your stomach 13. Talk with doctor before any long distance travel plans 14. Patient will need antibiotics prior to any dental procedure; instructed to contact VAD coordinator before any dental procedures (including routine cleaning)   Discharge binder given to patient and include the following: 1. List of emergency contacts 2. Wallet card 3. HM III Luggage tags 4. HM III Alarms for Patients and their Caregivers 5. HM III Patient Handbook 6. HM III Patient Education Program DVD 7. Daily diary sheets 8. Warfarin teaching sheets 9. Nosebleed teaching sheets 10. Medications you may and may not take with CHF list  Discharge equipment includes:  1. Two system controllers 2. One mobile power unit with attached 21 ' patient cable 3. One universal Charity fundraiser (UBC) 4. Eight fully charged batteries  5. Four battery clips 6. One travel case 7. One holster vest 8. Wearable accessory package 9. Daily dressing kits and anchors  Following notification process completed with: [facility] [employer]  Dr Woody Seller,  PCP   Discussed frequency and importance of INR checks; emphasized importance of maintaining INR goal to prevent clotting and or bleeding issues with pump.  Patient able to answer questions and asked good questions pertaining to warfarin and diet/lifestyle changes necessary  to be successful and safe.  Patient will have INR managed by VAD Clinic; current INR goal is 2.0 - 2.5.    The patient has completed a proficiency test for the HM III and all questions have been answered. The pt and family have been instructed to call if any questions, problems, or concerns arise. Pt and caregiver successfully paged VAD coordinator using VAD pager emergency number and have been instructed to use this number only for emergencies. Patient and caregiver asked appropriate questions, had good interaction with VAD coordinator, and verbalized understanding of above instructions.   Pt discharging to his house per original plan. His caregivers plan to stay with him for the first 2 weeks, and as needed after initial 2- week period.  Caregiver will plan to change drive line dressing as ordered. Pt verbalized agreement with this plan.   Total elapsed time:  5 hours  Emerson Monte RN [place] Coordinator  Office: [phone]  24/7 Pager: [phone]

## 2022-10-06 NOTE — Progress Notes (Signed)
ANTICOAGULATION CONSULT NOTE  Pharmacy Consult for Coumadin Indication:  LVAD  Allergies  Allergen Reactions   Zestril [Lisinopril] Cough    Patient Measurements: Height: '5\' 9"'$  (175.3 cm) Weight: 79.2 kg (174 lb 9.7 oz) IBW/kg (Calculated) : 70.7  Vital Signs: BP: 86/67 (02/26 0335) Pulse Rate: 92 (02/26 0335)  Labs: Recent Labs    10/04/22 0246 10/05/22 0220 10/06/22 0101  HGB 9.6* 8.9* 9.0*  HCT 28.7* 26.8* 27.1*  PLT 281 284 308  LABPROT 19.0* 18.0* 18.9*  INR 1.6* 1.5* 1.6*  CREATININE 1.02 1.22 1.09     Estimated Creatinine Clearance: 61.3 mL/min (by C-G formula based on SCr of 1.09 mg/dL).   Medical History: Past Medical History:  Diagnosis Date   Anal fissure    APML (acute promyelocytic leukemia) in remission ([place])    Completed treatment 04/2013   CHF (congestive heart failure) (HCC)    Coronary artery disease    Nonobstructive at cardiac catheterization 09/2018   Difficult intubation    w/shoulder surgery at surgical center between 2005-2008   Gastroesophageal reflux disease    History of blood transfusion 2014   History of kidney stones    passed stones   Hypercholesteremia    Neuropathy    bilateral feet   Nonischemic cardiomyopathy ([place])    a. EF 20% in 2015 - thought to possibly be viral induced or chemotherapy induced from treatments in 2014 b. at 35-40% by echo in 05/2016    Peptic ulcer disease    Pneumonia    Yrs ago   Type 2 diabetes mellitus ([place])    Ureteral colic    Vertigo    tx with valium prn   Wears glasses    Wears hearing aid in both ears     Assessment: 72 yo male s/p LVAD with HM3 on 2/14.  Warfarin '5mg'$  qd initiated post op 2/16 with quick INR jump to 6.7.    Doses held 2/18-2/21, reinitiated low dose on 2/22. INR increased slightly from 1.5 to 1.6, increasing PO intake. Hgb 9, plt 308 - stable, LDH stable around 200. No s/sx of bleeding or infusion issues.  Goal of Therapy:  INR 2-2.5 Monitor platelets by  anticoagulation protocol: Yes   Plan:  Coumadin 5 mg PO x 1 tonight. Daily INR.  Antonietta Jewel, PharmD, [place] Clinical Pharmacist  Phone: [phone] 10/06/2022 7:47 AM  Please check AMION for all [place] phone numbers After 10:00 PM, call [place] [phone]

## 2022-10-06 NOTE — Progress Notes (Signed)
CSW met with patient and wife at bedside as they were finishing up teaching and post test with Emerson Monte, [place] Coordinator. Patient  and wife were in good spirits and noted they are getting ready for discharge home. Wife appears to have a good understanding of LVAD and caregiving needs. CSW continues to follow for supportive needs and will follow up outpatient through the VAD clinic. Ricardo Zuniga, [place], [place]

## 2022-10-06 NOTE — Progress Notes (Signed)
Occupational Therapy Treatment Patient Details Name: Ricardo Zuniga MRN: [id number] DOB: 11/08/1950 Today's Date: 10/06/2022   History of present illness 72 yo M admitted 09/22/22  for RHC and optimization prior to LVAD HM3 implantation 09/24/22. 2/15 extubated. PMH: HFrEF, HTN, HLD, GERD, leukemia, NICM due to chemotherapy, multiple back surgeries, neck fusion, DM, neuropathy, shoulder surgery x 3.   OT comments  Patient with good progress toward all patient focused goals.  Patient looks and states he feels significantly better.  Patient preparing to discharge home tomorrow with [facility] rehab in place.  OT will continue to address deficits as long as he remains in the acute setting.     Recommendations for follow up therapy are one component of a multi-disciplinary discharge planning process, led by the attending physician.  Recommendations may be updated based on patient status, additional functional criteria and insurance authorization.    Follow Up Recommendations  Home health OT     Assistance Recommended at Discharge Intermittent Supervision/Assistance  Patient can return home with the following  Assist for transportation;Assistance with cooking/housework;A little help with bathing/dressing/bathroom;A little help with walking and/or transfers   Equipment Recommendations  None recommended by OT    Recommendations for Other Services      Precautions / Restrictions Precautions Precautions: Sternal;Fall Precaution Booklet Issued: Yes (comment) Precaution Comments: LVAD Restrictions Weight Bearing Restrictions: Yes Other Position/Activity Restrictions: Sternal       Mobility Bed Mobility                    Transfers                         Balance Overall balance assessment: Mild deficits observed, not formally tested   Sitting balance-Leahy Scale: Good     Standing balance support: Reliant on assistive device for balance Standing balance-Leahy Scale:  Fair                             ADL either performed or assessed with clinical judgement   ADL       Grooming: Wash/dry hands;Wash/dry face;Supervision/safety;Standing;Oral care               Lower Body Dressing: Supervision/safety;Sit to/from Archivist: Supervision/safety;Rollator (4 wheels);Regular Toilet;Ambulation                    Cognition Arousal/Alertness: Awake/alert Behavior During Therapy: Impulsive Overall Cognitive Status: Within Functional Limits for tasks assessed Area of Impairment: Safety/judgement                     Memory: Decreased recall of precautions   Safety/Judgement: Decreased awareness of safety                                 Pertinent Vitals/ Pain       Pain Assessment Pain Assessment: Faces Faces Pain Scale: Hurts a little bit Pain Location: back from battary packs Pain Descriptors / Indicators: Aching Pain Intervention(s): Monitored during session  Frequency  Min 2X/week        Progress Toward Goals  OT Goals(current goals can now be found in the care plan section)  Progress towards OT goals: Progressing toward goals  Acute Rehab OT Goals OT Goal Formulation: With patient Time For Goal Achievement: 10/13/22 Potential to Achieve Goals: Good  Plan Discharge plan remains appropriate    Co-evaluation                 AM-PAC OT "6 Clicks" Daily Activity     Outcome Measure   Help from another person eating meals?: None Help from another person taking care of personal grooming?: A Little Help from another person toileting, which includes using toliet, bedpan, or urinal?: A Little Help from another person bathing (including washing, rinsing, drying)?: A Little Help from another person to put on and taking off regular upper body clothing?: A Little Help from another person to put  on and taking off regular lower body clothing?: A Little 6 Click Score: 19    End of Session Equipment Utilized During Treatment: Rollator (4 wheels)  OT Visit Diagnosis: Unsteadiness on feet (R26.81)   Activity Tolerance Patient tolerated treatment well   Patient Left with call bell/phone within reach;in chair;with family/visitor present   Nurse Communication Mobility status        Time: [id number] OT Time Calculation (min): 18 min  Charges: OT General Charges $OT Visit: 1 Visit OT Treatments $Self Care/Home Management : 8-22 mins  10/06/2022  RP, OTR/L  Acute Rehabilitation Services  Office:  [phone]   Metta Clines 10/06/2022, 2:07 PM

## 2022-10-06 NOTE — Progress Notes (Signed)
Came to ambulate however pt preparing for VAD ed. Answered pts questions. Will f/u later as time allows.  [id number] Yves Dill BS, ACSM-CEP 10/06/2022 11:57 AM

## 2022-10-06 NOTE — Progress Notes (Addendum)
Patient ID: Ricardo Zuniga, male   DOB: 03-Nov-1950, 72 y.o.   MRN: [id number]   Advanced Heart Failure VAD Team Note  PCP-Cardiologist: [name], MD  [facility]: Dr. Hansford Hirt Nones   Subjective:    2/15 S/P HMIII LVAD.  2/22 Echo: Speed increased to 5400 rpm.  Moderate RV enlargement and dysfunction. Organized pericardial effusion.  2/24 Echo: Speed increased to 5500 rpm.  Moderate RVE/moderate dysfunction.  Organized pericardial effusion adjacent to RV without hemodynamic effect.   Central line out. No co-ox today.   MAP 75.   Wt below pre-op wt. Scr stable 1.09     INR 1.6, Hgb 9.0 LDH 213 => 183 => 208 =>198 => 192 => 191 =>180   OOB sitting up in chair. Feels well. Denies CP. No dyspnea. Appetite is good. No complaints.   LVAD INTERROGATION:  HeartMate III LVAD:   Flow 4.0 liters/min, speed 5500, power 4.2, PI 3.1  Objective:    Vital Signs:   Temp:  [97.6 F (36.4 C)-98.1 F (36.7 C)] 98.1 F (36.7 C) (02/25 1946) Pulse Rate:  [84-161] 92 (02/26 0335) Resp:  [15-24] 24 (02/25 1900) BP: (74-113)/(48-99) 86/67 (02/26 0335) SpO2:  [92 %-97 %] 96 % (02/26 0335) Weight:  [79.2 kg] 79.2 kg (02/26 0650) Last BM Date : 10/04/22 Mean arterial Pressure  75 Intake/Output:   Intake/Output Summary (Last 24 hours) at 10/06/2022 0743 Last data filed at 10/06/2022 0400 Gross per 24 hour  Intake 120 ml  Output 1475 ml  Net -1355 ml     Physical Exam    General: Well appearing, OOB sitting up in chair  HEENT: Normal. Neck: Supple, JVP no elevated. Carotids ok no bruits   Cardiac:  Mechanical heart sounds with LVAD hum present.  Lungs:  Clear bilaterally  Abdomen:  NT, ND, no HSM. No bruits or masses. +BS  LVAD exit site: Well-healed and incorporated. Dressing dry and intact. No erythema or drainage. Stabilization device present and accurately applied. Driveline dressing changed daily per sterile technique. Extremities:  Warm and dry. No cyanosis, clubbing, rash, or edema.   Neuro:  Alert & oriented x 3. Cranial nerves grossly intact. Moves all 4 extremities w/o difficulty. Affect pleasant     Telemetry   SR 90s with occasional PVCs (personally reviewed).    EKG    N/A   Labs   Basic Metabolic Panel: Recent Labs  Lab 09/30/22 0417 10/01/22 0453 10/02/22 0336 10/03/22 0403 10/04/22 0246 10/05/22 0220 10/06/22 0101  NA 134* 135 135 134* 136 132* 134*  K 3.3* 3.6 4.0 3.8 4.4 4.1 3.9  CL 94* 93* 95* 93* 95* 93* 97*  CO2 '28 29 30 29 28 27 27  '$ GLUCOSE 87 127* 119* 141* 100* 142* 74  BUN '14 13 15 17 17 17 19  '$ CREATININE 1.02 0.99 1.14 0.97 1.02 1.22 1.09  CALCIUM 8.3* 8.4* 8.4* 8.5* 8.8* 8.3* 8.4*  MG 1.8 1.9  --  1.9 2.5* 2.2 2.3  PHOS 2.7 2.8  --   --   --   --   --     Liver Function Tests: No results for input(s): "AST", "ALT", "ALKPHOS", "BILITOT", "PROT", "ALBUMIN" in the last 168 hours.  No results for input(s): "LIPASE", "AMYLASE" in the last 168 hours. No results for input(s): "AMMONIA" in the last 168 hours.  CBC: Recent Labs  Lab 09/30/22 0417 10/01/22 0453 10/02/22 0828 10/03/22 0403 10/04/22 0246 10/05/22 0220 10/06/22 0101  WBC 6.4 7.1 10.2 9.4 9.4 8.7  8.0  NEUTROABS 5.1 5.6 9.0*  --   --   --   --   HGB 9.1* 9.1* 9.7* 9.3* 9.6* 8.9* 9.0*  HCT 26.7* 28.0* 29.1* 28.1* 28.7* 26.8* 27.1*  MCV 89.9 92.4 91.8 91.8 91.1 91.2 91.2  PLT 195 229 248 260 281 284 308    INR: Recent Labs  Lab 10/02/22 0336 10/03/22 0403 10/04/22 0246 10/05/22 0220 10/06/22 0101  INR 2.0* 2.1* 1.6* 1.5* 1.6*    Other results: EKG:    Imaging   No results found.   Medications:     Scheduled Medications:  amiodarone  200 mg Oral Daily   aspirin EC  81 mg Oral Daily   atorvastatin  40 mg Oral Daily   Chlorhexidine Gluconate Cloth  6 each Topical Daily   digoxin  0.0625 mg Oral Daily   docusate sodium  200 mg Oral BID   empagliflozin  10 mg Oral Daily   feeding supplement  237 mL Oral TID BM   insulin aspart  0-24 Units  Subcutaneous TID WC   metoCLOPramide (REGLAN) injection  5 mg Intravenous Q8H   pantoprazole  40 mg Oral Daily   polyethylene glycol  17 g Oral Daily   senna  1 tablet Oral Daily   sodium chloride flush  3 mL Intravenous Q12H   spironolactone  25 mg Oral Daily   Warfarin - Pharmacist Dosing Inpatient   Does not apply q1600    Infusions:  sodium chloride Stopped (09/30/22 1155)    PRN Medications: sodium chloride, acetaminophen, albuterol, dextrose, morphine injection, ondansetron (ZOFRAN) IV, mouth rinse, oxyCODONE, sodium chloride flush, traMADol, traZODone   Patient Profile  72 y/o male w/ long standing h/o chronic systolic heart failure due to nonischemic cardiomyopathy, T2DM and hx of acute promyelocytic leukemia s/p ATRA ([facility] 2014). Recent development of rapidly progressive heart failure,  NYHA class IIIb-IV symptoms. Admitted for for RHC and optimization prior to VAD implant.   RHC w/ elevated filling pressures and low output (RA 13, PAP 64/26 (40), PCWP 23, CI 2 L/min/m2) =>started on Milrinone  2/14 S/P HMIII VAD   Assessment/Plan:    1. Chronic HFrEF, NICM -> 09/24/22 S/P HMIII LVAD: Off milrinone. Speed increased to 5400 after 2/21 echo and to 5500 after 2/23 echo.  Good flow at 4.2. Central line out.  Not significantly volume overloaded and weight below baseline.  INR 1.6, LDH 180.   - Stop Lasix today (had 1 IV dose this morning). Reassess need for po Lasix tomorrow.    - Continue spironolactone 25 daily.  - Continue Jardiance 10 mg  - no other GDMT yet w/ MAP in low 70s - No loop diuretics today  2. Acute blood loss anemia: Hgb stable, 9.0  3. NSVT/PVCS: Rare PVCs. - cont amiodarone 200 mg daily.   4.  Hx of promyelocytic leukemia: S/P ATRA in 2014 at [facility].  5.  Hx of endocarditis: 2015 6.  Nausea/poor appetite: Improved.  7. Altered mental status: Noted 2/23, delirium (no sleep x 2 days).  CT head without acute changes.  Much clearer this morning after good  sleep.    Transfer to [facility] today. Unable to d/c home today as home VAD equipment has not yet arrived. Hopefully home tomorrow.   Nelida Gores  10/06/2022 7:43 AM  Patient seen and examined with the above-signed Advanced Practice Provider and/or Housestaff. I personally reviewed laboratory data, imaging studies and relevant notes. I independently examined the patient and formulated the  important aspects of the plan. I have edited the note to reflect any of my changes or salient points. I have personally discussed the plan with the patient and/or family.  Confusion resolved. Now able to participate in VAD training. Volume status much improved. INR 1.6   VAD interrogated personally. Parameters stable.  Denies CP or SOB.   General:  NAD.  HEENT: normal  Neck: supple. JVP not elevated.  Carotids 2+ bilat; no bruits. No lymphadenopathy or thryomegaly appreciated. Cor: LVAD hum.  Lungs: Clear. Abdomen: obese soft, nontender, non-distended. No hepatosplenomegaly. No bruits or masses. Good bowel sounds. Driveline site clean. Anchor in place.  Extremities: no cyanosis, clubbing, rash. Warm no edema  Neuro: alert & oriented x 3. No focal deficits. Moves all 4 without problem   Volume status much improved. Confusion resolved. Will get VAD teaching today.   INR 1.6. Discussed coumadin dosing with PharmD personally.  Glori Bickers, MD  2:27 PM

## 2022-10-06 NOTE — Inpatient Diabetes Management (Signed)
Inpatient Diabetes Program Recommendations  AACE/ADA: New Consensus Statement on Inpatient Glycemic Control (2015)  Target Ranges:  Prepandial:   less than 140 mg/dL      Peak postprandial:   less than 180 mg/dL (1-2 hours)      Critically ill patients:  140 - 180 mg/dL   Lab Results  Component Value Date   GLUCAP 163 (H) 10/06/2022   HGBA1C 7.0 (H) 09/24/2022    Review of Glycemic Control  Latest Reference Range & Units 10/05/22 07:38 10/05/22 11:20 10/05/22 15:51 10/05/22 19:42 10/06/22 06:55  Glucose-Capillary 70 - 99 mg/dL 131 (H)  Novolog 2 units 269 (H)  Novolog 12 units 122 (H)  Novolog 2 units 288 (H)  Novolog 12 units 163 (H)   Diabetes history: DM 2 Outpatient Diabetes medications: Jardiance 10 QD, Prandin 4 mg in am  Current orders for Inpatient glycemic control:  Novolog 0-24 units tid Jardiance 10 mg Daily  Ensure Enlive tid between meals (40 gram of carbohydrate)  Inpatient Diabetes Program Recommendations:    Note: Glucose trends increase after meals and supplement intake  -   May consider adding Novolog 4 units tid meal coverage if eating >50% of meals.  Thanks,  Tama Headings RN, MSN, BC-ADM Inpatient Diabetes Coordinator Team Pager [phone] (8a-5p)

## 2022-10-06 NOTE — Care Management Important Message (Signed)
Important Message  Patient Details  Name: Ricardo Zuniga MRN: [id number] Date of Birth: 1950/12/20   Medicare Important Message Given:  Yes     Orbie Pyo 10/06/2022, 3:18 PM

## 2022-10-06 NOTE — Progress Notes (Signed)
LVAD Coordinator Rounding Note:  Admitted 09/22/22 to Dr. Conception Chancy service for [place] and optimization prior to VAD implant.   HeartMate III LVAD implanted on 09/25/22 by Dr.Enter under DT criteria.   Patient awake and alert this morning sitting up in bed eating breakfast. Pt states that he is feeling better today than he was over the weekend.   Ramp echo completed 2/23- tolerating increased speed 5500.   Plan to transfer to [facility] when bed available.   Home equipment arrived today. Has been check in by [facility]. VAD discharge education completed with pt and wife. See separate note for documentation.   Vital signs: Temp: 98.6  HR: 90 Automatic BP:  Doppler MAP: 82 O2 Sat: 92% on RA Wt: 194>195.7>199.9>193.2>190.4>185.6>186.3>186.9>183.6 lbs  LVAD interrogation reveals:  Speed: 5500 Flow: 4.3 Power: 4.1 w PI: 3.1  Alarms: none  Events: none  Fixed speed:  5500 Low speed limit: 5200 HCT: 27  Drive Line:  CDI. Anchor secure. Existing VAD dressing removed and site care performed using sterile technique. Drive line exit site cleaned with Chlora prep applicators x 2, allowed to dry, and [place] dressing with Silver strip applied. Exit site healing and unincorporated, the velour is fully implanted at exit site. No redness, tenderness, drainage, foul odor or rash noted. Dried scab at exit site. Drive line anchor repositioned and re-applied. Pt denies fever or chills. Continue twice weekly dressing changes. Next dressing change due 10/09/22 by VAD Coordinator, trained caregiver or nurse champion.       Labs:  Hgb:  9.7>9.6>8.5>9.1>9.1>9.7>9.3>9.0  WBC:  10.8>15.5>7.9>6.4>7.1>10.2>9.4>8.0  LDH trend:  284>338>245>213>183>208>198>180  INR trend:  1.6>1.7>1.8>5.5>4.6>3.5>2.7>2>2.1>1.6  Co-ox: 74.2>68.7>75.3>68.4>74>79.5>68.1>56>58  Creatinine: 1.4>0.92>1.41>1.49>1.27>1.17>1.02>0.99>1.14>0.97>1.09   Intra-op blood products: 4 FFP Cell Saver 650 mL  ICU Blood  Products: 09/25/22 1 PRBC 09/28/22 1 FFP  Arrhythmias:   Gtts: Epinephrine: 1 mcg/min-off 09/29/22 Vasopressin: 0.04 units/min- stopped 09/27/22 Milrinone: 0.125 mcg/kg/min- stopped 10/01/22 Lasix 4 mg/hr- stopped 10/01/22  Ventilator Status:  Extubated 09/25/22  Nitric oxide:  Weaned off 09/25/22  Anticoagulation Plan: INR Goal: 2-2.5 ASA dose: 81 mg   Adverse Events on VAD:   Patient Education: Discharge education completed with pt and wife 10/06/22. See separate note for documentation.  Dressing change performed by pt's wife with minimal verbal cues from VAD coordinator. Caregiver checked off to perform dressing changes independently. Will change dressing again tomorrow prior to discharge home per wife request.   Plan/Recommendations:  1. Page VAD coordinator for any issues with VAD equipment or driveline. 2. Twice weekly dressing changes by VAD coordinator, trained caregiver or nurse champion.   Emerson Monte RN [place] Coordinator  Office: [phone]  24/7 Pager: [phone]

## 2022-10-06 NOTE — TOC Progression Note (Signed)
Transition of Care [facility]) - Progression Note    Patient Details  Name: Ricardo Zuniga MRN: [id number] Date of Birth: 03/01/1951  Transition of Care [facility]) CM/SW Contact  Erenest Rasher, RN Phone Number: [phone] 10/06/2022, 11:09 AM  Clinical Narrative:      Spoke to pt's wife and she requested [facility]. Contacted [facility] and they are unable to accept referral. Contacted [facility] Liaison, Amy and they are able to accept referral. Contacted attending to for [facility] orders. Requesting a RW with seat and bedside commode (3n1). [place] for DME for home to be delivered to room prior to dc.      Expected Discharge Plan: [place] Barriers to Discharge: Continued Medical Work up  Expected Discharge Plan and Services   Discharge Planning Services: CM Consult Post Acute Care Choice: [place] arrangements for the past 2 months: Single Family Home                 DME Arranged: Walker rolling with seat, 3-N-1 DME Agency: [employer] Date DME Agency Contacted: 10/06/22 Time DME Agency Contacted: 1104 Representative spoke with at DME Agency: Marjie Skiff HH Arranged: PT, OT [facility] Agency: [place] Date [place]: 10/06/22 Time [place]: 1109 Representative spoke with at [place]: [place] ([place]) Interventions SDOH Screenings   Food Insecurity: No Food Insecurity (09/23/2022)  Housing: Low Risk  (09/23/2022)  Transportation Needs: No Transportation Needs (09/23/2022)  Utilities: Not At Risk (09/23/2022)  Depression (PHQ2-9): Low Risk  (09/03/2022)  Financial Resource Strain: Low Risk  (09/03/2022)  Tobacco Use: Medium Risk (09/25/2022)    Readmission Risk Interventions    11/01/2021    3:26 PM  Readmission Risk Prevention Plan  Transportation Screening Complete  HRI or [place] Complete  Social Work Consult for [place] Planning/Counseling  Complete  Palliative Care Screening Not Applicable  Medication Review Press photographer) Complete

## 2022-10-07 ENCOUNTER — Encounter (HOSPITAL_COMMUNITY): Payer: Self-pay | Admitting: *Deleted

## 2022-10-07 ENCOUNTER — Encounter: Payer: Self-pay | Admitting: *Deleted

## 2022-10-07 ENCOUNTER — Other Ambulatory Visit (HOSPITAL_COMMUNITY): Payer: Self-pay

## 2022-10-07 DIAGNOSIS — I5023 Acute on chronic systolic (congestive) heart failure: Secondary | ICD-10-CM | POA: Diagnosis not present

## 2022-10-07 LAB — BASIC METABOLIC PANEL
Anion gap: 12 (ref 5–15)
BUN: 20 mg/dL (ref 8–23)
CO2: 24 mmol/L (ref 22–32)
Calcium: 8.5 mg/dL — ABNORMAL LOW (ref 8.9–10.3)
Chloride: 97 mmol/L — ABNORMAL LOW (ref 98–111)
Creatinine, Ser: 0.96 mg/dL (ref 0.61–1.24)
GFR, Estimated: >60 mL/min (ref >60)
Glucose, Bld: 183 mg/dL — ABNORMAL HIGH (ref 70–99)
Potassium: 4.2 mmol/L (ref 3.5–5.1)
Sodium: 133 mmol/L — ABNORMAL LOW (ref 135–145)

## 2022-10-07 LAB — MAGNESIUM: Magnesium: 2.2 mg/dL (ref 1.7–2.4)

## 2022-10-07 LAB — CBC
HCT: 27.8 % — ABNORMAL LOW (ref 39.0–52.0)
Hemoglobin: 9.2 g/dL — ABNORMAL LOW (ref 13.0–17.0)
MCH: 30.4 pg (ref 26.0–34.0)
MCHC: 33.1 g/dL (ref 30.0–36.0)
MCV: 91.7 fL (ref 80.0–100.0)
Platelets: 331 10*3/uL (ref 150–400)
RBC: 3.03 MIL/uL — ABNORMAL LOW (ref 4.22–5.81)
RDW: 16.1 % — ABNORMAL HIGH (ref 11.5–15.5)
WBC: 8.8 10*3/uL (ref 4.0–10.5)
nRBC: 0 % (ref 0.0–0.2)

## 2022-10-07 LAB — GLUCOSE, CAPILLARY: Glucose-Capillary: 151 mg/dL — ABNORMAL HIGH (ref 70–99)

## 2022-10-07 LAB — LACTATE DEHYDROGENASE: LDH: 196 U/L — ABNORMAL HIGH (ref 98–192)

## 2022-10-07 LAB — PROTIME-INR
INR: 1.8 — ABNORMAL HIGH (ref 0.8–1.2)
Prothrombin Time: 20.7 seconds — ABNORMAL HIGH (ref 11.4–15.2)

## 2022-10-07 MED ORDER — TRAMADOL HCL 50 MG PO TABS
50.0000 mg | ORAL_TABLET | Freq: Four times a day (QID) | ORAL | 0 refills | Status: AC | PRN
  Filled 2022-10-07: qty 14, 3d supply, fill #0

## 2022-10-07 MED ORDER — WARFARIN SODIUM 5 MG PO TABS: 5.0000 mg | ORAL_TABLET | Freq: Every day | ORAL | Status: DC

## 2022-10-07 MED ORDER — FUROSEMIDE 40 MG PO TABS: 40.0000 mg | ORAL_TABLET | Freq: Every day | ORAL | Status: DC

## 2022-10-07 MED ORDER — TRAZODONE HCL 100 MG PO TABS
100.0000 mg | ORAL_TABLET | Freq: Every evening | ORAL | 3 refills | Status: DC | PRN
  Filled 2022-10-07 – 2022-11-13 (×3): qty 30, 30d supply, fill #0

## 2022-10-07 MED ORDER — AMIODARONE HCL 200 MG PO TABS
200.0000 mg | ORAL_TABLET | Freq: Every day | ORAL | 5 refills | Status: DC
  Filled 2022-10-07: qty 30, 30d supply, fill #0

## 2022-10-07 MED ORDER — FUROSEMIDE 10 MG/ML IJ SOLN
80.0000 mg | Freq: Once | INTRAMUSCULAR | Status: AC
  Administered 2022-10-07: 80 mg via INTRAVENOUS
  Filled 2022-10-07: qty 8

## 2022-10-07 MED ORDER — POTASSIUM CHLORIDE CRYS ER 20 MEQ PO TBCR
20.0000 meq | EXTENDED_RELEASE_TABLET | Freq: Every day | ORAL | 3 refills | Status: DC
  Filled 2022-10-07: qty 60, 60d supply, fill #0

## 2022-10-07 MED ORDER — WARFARIN SODIUM 5 MG PO TABS
5.0000 mg | ORAL_TABLET | Freq: Once | ORAL | Status: AC
  Administered 2022-10-07: 5 mg via ORAL
  Filled 2022-10-07: qty 1

## 2022-10-07 MED ORDER — WARFARIN SODIUM 5 MG PO TABS
ORAL_TABLET | ORAL | 11 refills | Status: DC
  Filled 2022-10-07: qty 30, 30d supply, fill #0

## 2022-10-07 MED ORDER — FUROSEMIDE 40 MG PO TABS
40.0000 mg | ORAL_TABLET | Freq: Every day | ORAL | 5 refills | Status: DC
  Filled 2022-10-07: qty 30, 30d supply, fill #0

## 2022-10-07 NOTE — Plan of Care (Signed)
  Problem: Education: Goal: Knowledge of General Education information will improve Description: Including pain rating scale, medication(s)/side effects and non-pharmacologic comfort measures Outcome: Progressing   Problem: Health Behavior/Discharge Planning: Goal: Ability to manage health-related needs will improve Outcome: Progressing   Problem: Clinical Measurements: Goal: Ability to maintain clinical measurements within normal limits will improve Outcome: Progressing Goal: Will remain free from infection Outcome: Progressing Goal: Diagnostic test results will improve Outcome: Progressing Goal: Respiratory complications will improve Outcome: Progressing Goal: Cardiovascular complication will be avoided Outcome: Progressing   Problem: Activity: Goal: Risk for activity intolerance will decrease Outcome: Progressing   Problem: Nutrition: Goal: Adequate nutrition will be maintained Outcome: Progressing   Problem: Coping: Goal: Level of anxiety will decrease Outcome: Progressing   Problem: Elimination: Goal: Will not experience complications related to bowel motility Outcome: Progressing Goal: Will not experience complications related to urinary retention Outcome: Progressing   Problem: Pain Managment: Goal: General experience of comfort will improve Outcome: Progressing   Problem: Safety: Goal: Ability to remain free from injury will improve Outcome: Progressing   Problem: Skin Integrity: Goal: Risk for impaired skin integrity will decrease Outcome: Progressing   Problem: Education: Goal: Ability to describe self-care measures that may prevent or decrease complications (Diabetes Survival Skills Education) will improve Outcome: Progressing Goal: Individualized Educational Video(s) Outcome: Progressing   Problem: Coping: Goal: Ability to adjust to condition or change in health will improve Outcome: Progressing   Problem: Fluid Volume: Goal: Ability to  maintain a balanced intake and output will improve Outcome: Progressing   Problem: Health Behavior/Discharge Planning: Goal: Ability to identify and utilize available resources and services will improve Outcome: Progressing Goal: Ability to manage health-related needs will improve Outcome: Progressing   Problem: Metabolic: Goal: Ability to maintain appropriate glucose levels will improve Outcome: Progressing   Problem: Nutritional: Goal: Maintenance of adequate nutrition will improve Outcome: Progressing Goal: Progress toward achieving an optimal weight will improve Outcome: Progressing   Problem: Skin Integrity: Goal: Risk for impaired skin integrity will decrease Outcome: Progressing   Problem: Tissue Perfusion: Goal: Adequacy of tissue perfusion will improve Outcome: Progressing   Problem: Education: Goal: Knowledge of the prescribed therapeutic regimen will improve Outcome: Progressing   Problem: Activity: Goal: Risk for activity intolerance will decrease Outcome: Progressing   Problem: Cardiac: Goal: Ability to maintain an adequate cardiac output will improve Outcome: Progressing   Problem: Coping: Goal: Level of anxiety will decrease Outcome: Progressing   Problem: Fluid Volume: Goal: Risk for excess fluid volume will decrease Outcome: Progressing   Problem: Clinical Measurements: Goal: Ability to maintain clinical measurements within normal limits will improve Outcome: Progressing Goal: Will remain free from infection Outcome: Progressing   Problem: Respiratory: Goal: Will regain and/or maintain adequate ventilation Outcome: Progressing

## 2022-10-07 NOTE — Progress Notes (Signed)
LVAD Coordinator Rounding Note:  Admitted 09/22/22 to Dr. Conception Chancy service for [place] and optimization prior to VAD implant.   HeartMate III LVAD implanted on 09/25/22 by Dr.Enter under DT criteria.   Patient awake and alert this morning sitting up in bed eating breakfast. He is excited about going home today.   VAD discharge education completed with pt and wife 2/26. See separate note for documentation. Home equipment delivered to bedside.  Vital signs: Temp: 97.8 HR: 90 Automatic BP: 93/61 (67) Doppler MAP: 86 O2 Sat: 98% on RA Wt: 194>195.7>199.9>193.2>190.4>185.6>186.3>186.9>183.6>172.2 lbs  LVAD interrogation reveals:  Speed: 5500 Flow: 4.3 Power: 4.1 w PI: 2.9  Alarms: none  Events: none  Fixed speed:  5500 Low speed limit: 5200 HCT: 27  Drive Line:  CDI. Anchor secure. Next dressing change due 10/09/22 by VAD Coordinator, trained caregiver or nurse champion.  Labs:  Hgb:  9.7>9.6>8.5>9.1>9.1>9.7>9.3>9.0>9.2  WBC:  10.8>15.5>7.9>6.4>7.1>10.2>9.4>8.0>8.8  LDH trend:  284>338>245>213>183>208>198>180>196  INR trend:  1.6>1.7>1.8>5.5>4.6>3.5>2.7>2>2.1>1.6>1.8  Co-ox: 74.2>68.7>75.3>68.4>74>79.5>68.1>56>58  Creatinine: 1.4>0.92>1.41>1.49>1.27>1.17>1.02>0.99>1.14>0.97>1.09>0.96   Intra-op blood products: 4 FFP Cell Saver 650 mL  ICU Blood Products: 09/25/22 1 PRBC 09/28/22 1 FFP  Arrhythmias:   Gtts: Epinephrine: 1 mcg/min-off 09/29/22 Vasopressin: 0.04 units/min- stopped 09/27/22 Milrinone: 0.125 mcg/kg/min- stopped 10/01/22 Lasix 4 mg/hr- stopped 10/01/22  Ventilator Status:  Extubated 09/25/22  Nitric oxide:  Weaned off 09/25/22  Anticoagulation Plan: INR Goal: 2-2.5 ASA dose: 81 mg   Adverse Events on VAD:   Patient Education: Discharge education completed with pt and wife 10/06/22. See separate note for documentation.    Plan/Recommendations:  1. Page VAD coordinator for any issues with VAD equipment or driveline. 2. Twice weekly dressing  changes by VAD coordinator, trained caregiver or nurse champion.   Emerson Monte RN [place] Coordinator  Office: [phone]  24/7 Pager: [phone]

## 2022-10-07 NOTE — Progress Notes (Signed)
Patient discharge teaching completed at bedside with patient and caregiver [name]. All questions answered. Plan to follow up in [facility] 3/5 at 11:00 with [name].  Drive Line:  Existing VAD dressing removed and site care performed using sterile technique by caregiver [name]. Drive line exit site cleaned with Chlora prep applicators x 2, allowed to dry, and gauze dressing with Silver strip applied. Exit site healing and unincorporated, the velour is fully implanted at exit site. No redness, tenderness, drainage, foul odor or rash noted. Drive line anchor repositioned and re-applied. Pt denies fever or chills. Continue twice weekly dressing changes. Next dressing change due 10/09/22 by caregiver.    Bobbye Morton RN, BSN VAD Coordinator 24/7 Pager [phone]

## 2022-10-07 NOTE — Progress Notes (Signed)
Discharge equipment includes:  1. Two system controllers. 2. Mobile Power Unit (MPU) with 20' patient cable 3. One universal Charity fundraiser (UBC) 4. Eight fully charged batteries  5. Four battery clips 6. One travel case 7. One holster vest 8. 1 Consolidated bag  8. Wearable accessory package 9. Daily dressing kits, anchors, Aquacel (silver dressing)   VAD Education:    1. Reviewed importance of having a 24 hour caregiver 2. Reviewed dressing change frequency 3. Reviewed when to call the VAD pager and made sure they have phone number in their phone.  4. Reviewed importance of changing one power source at a time 5. Reviewed importance of carrying black emergency bag containing backup controller, 2 batteries, and 2 battery clips, everywhere 6. Reviewed importance of placing mobile power unit (MPU) and batteries on bedside table with a flashlight. Talked about what to do in case of power failure. Reminded to make sure the outlets that equipment is plugged into are not controlled by a light switch.  7. Reviewed importance of using anchors to hold drive line in place, to prevent accidental pulling, or dislodgement of drive line.  8. Patient and family agreed to pick up prescriptions. Stressed importance of taking Warfarin daily in the evening. Stressed importance of taking all prescribed medications as written 9. First clinic visit bring all medications and VAD log.    Emerson Monte RN [place] Coordinator  Office: [phone]  24/7 Pager: [phone]

## 2022-10-07 NOTE — Progress Notes (Signed)
Pt excited about CRPII, will refer to AP.   Ricardo Zuniga 10/07/2022 9:54 AM

## 2022-10-07 NOTE — Progress Notes (Addendum)
ANTICOAGULATION CONSULT NOTE  Pharmacy Consult for Coumadin Indication:  LVAD  Allergies  Allergen Reactions   Zestril [Lisinopril] Cough    Patient Measurements: Height: '5\' 9"'$  (175.3 cm) Weight: 78.1 kg (172 lb 2.9 oz) IBW/kg (Calculated) : 70.7  Vital Signs: Temp: 97.8 F (36.6 C) (02/27 0733) Temp Source: Oral (02/26 2314) BP: 74/61 (02/27 0733) Pulse Rate: 90 (02/27 0733)  Labs: Recent Labs    10/05/22 0220 10/06/22 0101 10/07/22 0012  HGB 8.9* 9.0* 9.2*  HCT 26.8* 27.1* 27.8*  PLT 284 308 331  LABPROT 18.0* 18.9* 20.7*  INR 1.5* 1.6* 1.8*  CREATININE 1.22 1.09 0.96     Estimated Creatinine Clearance: 69.6 mL/min (by C-G formula based on SCr of 0.96 mg/dL).   Medical History: Past Medical History:  Diagnosis Date   Anal fissure    APML (acute promyelocytic leukemia) in remission ([place])    Completed treatment 04/2013   CHF (congestive heart failure) (HCC)    Coronary artery disease    Nonobstructive at cardiac catheterization 09/2018   Difficult intubation    w/shoulder surgery at surgical center between 2005-2008   Gastroesophageal reflux disease    History of blood transfusion 2014   History of kidney stones    passed stones   Hypercholesteremia    Neuropathy    bilateral feet   Nonischemic cardiomyopathy ([place])    a. EF 20% in 2015 - thought to possibly be viral induced or chemotherapy induced from treatments in 2014 b. at 35-40% by echo in 05/2016    Peptic ulcer disease    Pneumonia    Yrs ago   Type 2 diabetes mellitus ([place])    Ureteral colic    Vertigo    tx with valium prn   Wears glasses    Wears hearing aid in both ears     Assessment: 72 yo male s/p LVAD with HM3 on 2/14.  Warfarin '5mg'$  qd initiated post op 2/16 with quick INR jump to 6.7.    Doses held 2/18-2/21, reinitiated low dose on 2/22. INR increased slightly from 1.6 to 1.8, increasing PO intake. Hgb 9.2, plt 331 - stable, LDH stable around 200. No s/sx of bleeding or infusion  issues.  Goal of Therapy:  INR 2-2.5 Monitor platelets by anticoagulation protocol: Yes   Plan:  Coumadin 5 mg PO x 1 tonight before discharges Daily INR. Will plan to discharge on warfarin 5 mg daily except 2.5 mg on Tuesday/Thursday. INR appointment for 3/5.  Antonietta Jewel, PharmD, [place] Clinical Pharmacist  Phone: [phone] 10/07/2022 7:40 AM  Please check AMION for all [place] phone numbers After 10:00 PM, call [place] [phone]

## 2022-10-07 NOTE — Progress Notes (Signed)
Went over discharge paper work with patient and wife. All questions answered. LVAD education done, see notes. Will give warfarin 5 mg before discharge. TOC medications delivered at bedside. DME also delivered on 10/06/22. PIV and telemetry removed. All belongings at bedside.

## 2022-10-07 NOTE — Progress Notes (Addendum)
Patient ID: Ricardo Zuniga, male   DOB: 04-23-1951, 72 y.o.   MRN: [id number]   Advanced Heart Failure VAD Team Note  PCP-Cardiologist: [name], MD  [facility]: Dr. Ranulfo Kall Nones   Subjective:   2/15 S/P HMIII LVAD.  2/22 Echo: Speed increased to 5400 rpm.  Moderate RV enlargement and dysfunction. Organized pericardial effusion.  2/24 Echo: Speed increased to 5500 rpm.  Moderate RVE/moderate dysfunction.  Organized pericardial effusion adjacent to RV without hemodynamic effect.   MAP 70s-80s.   Wt below pre-op wt. Scr stable .96  INR 1.8, Hgb 9.2 LDH 213 => 183 => 208 =>198 => 192 => 191 =>180 =>196  OOB in chair. Feels good, ready to go home. Denies CP/SOB.  LVAD INTERROGATION:  HeartMate III LVAD:   Flow 4 liters/min, speed 5500, power 4.1, PI 4. No PI events Objective:    Vital Signs:   Temp:  [97.4 F (36.3 C)-98.6 F (37 C)] 97.8 F (36.6 C) (02/27 0733) Pulse Rate:  [77-96] 90 (02/27 0733) Resp:  [18-20] 19 (02/27 0733) BP: (74-134)/(49-99) 74/61 (02/27 0733) SpO2:  [96 %-99 %] 98 % (02/27 0733) Weight:  [78.1 kg] 78.1 kg (02/27 0502) Last BM Date : 10/06/22 Mean arterial Pressure  70s-80s Intake/Output:   Intake/Output Summary (Last 24 hours) at 10/07/2022 0814 Last data filed at 10/06/2022 1900 Gross per 24 hour  Intake 240 ml  Output 300 ml  Net -60 ml     Physical Exam  General:  Well appearing. No resp difficulty HEENT: Normal Neck: supple. JVP ~9. Carotids 2+ bilat; no bruits. No lymphadenopathy or thyromegaly appreciated. Cor: Mechanical heart sounds with LVAD hum present. Healing sternal incision Lungs: Clear Abdomen: soft, nontender, nondistended. No hepatosplenomegaly. No bruits or masses. Good bowel sounds. Driveline: C/D/I; securement device intact and driveline incorporated. Medipore tape/gauze dressing over site.  Extremities: no cyanosis, clubbing, rash, +1-2 L>R edema Neuro: alert & orientedx3, cranial nerves grossly intact. moves all 4  extremities w/o difficulty. Affect pleasant  Telemetry   SR 80s with occasional PVCs (personally reviewed).    EKG  N/A  Labs   Basic Metabolic Panel: Recent Labs  Lab 10/01/22 0453 10/02/22 0336 10/03/22 0403 10/04/22 0246 10/05/22 0220 10/06/22 0101 10/07/22 0012  NA 135   < > 134* 136 132* 134* 133*  K 3.6   < > 3.8 4.4 4.1 3.9 4.2  CL 93*   < > 93* 95* 93* 97* 97*  CO2 29   < > '29 28 27 27 24  '$ GLUCOSE 127*   < > 141* 100* 142* 74 183*  BUN 13   < > '17 17 17 19 20  '$ CREATININE 0.99   < > 0.97 1.02 1.22 1.09 0.96  CALCIUM 8.4*   < > 8.5* 8.8* 8.3* 8.4* 8.5*  MG 1.9  --  1.9 2.5* 2.2 2.3 2.2  PHOS 2.8  --   --   --   --   --   --    < > = values in this interval not displayed.    Liver Function Tests: No results for input(s): "AST", "ALT", "ALKPHOS", "BILITOT", "PROT", "ALBUMIN" in the last 168 hours.  No results for input(s): "LIPASE", "AMYLASE" in the last 168 hours. No results for input(s): "AMMONIA" in the last 168 hours.  CBC: Recent Labs  Lab 10/01/22 0453 10/02/22 0828 10/03/22 0403 10/04/22 0246 10/05/22 0220 10/06/22 0101 10/07/22 0012  WBC 7.1 10.2 9.4 9.4 8.7 8.0 8.8  NEUTROABS 5.6 9.0*  --   --   --   --   --  HGB 9.1* 9.7* 9.3* 9.6* 8.9* 9.0* 9.2*  HCT 28.0* 29.1* 28.1* 28.7* 26.8* 27.1* 27.8*  MCV 92.4 91.8 91.8 91.1 91.2 91.2 91.7  PLT 229 248 260 281 284 308 331    INR: Recent Labs  Lab 10/03/22 0403 10/04/22 0246 10/05/22 0220 10/06/22 0101 10/07/22 0012  INR 2.1* 1.6* 1.5* 1.6* 1.8*   Other results: EKG:   Imaging   No results found.  Medications:     Scheduled Medications:  amiodarone  200 mg Oral Daily   aspirin EC  81 mg Oral Daily   atorvastatin  40 mg Oral Daily   Chlorhexidine Gluconate Cloth  6 each Topical Daily   digoxin  0.0625 mg Oral Daily   docusate sodium  200 mg Oral BID   empagliflozin  10 mg Oral Daily   feeding supplement  237 mL Oral TID BM   insulin aspart  0-24 Units Subcutaneous TID WC    metoCLOPramide (REGLAN) injection  5 mg Intravenous Q8H   pantoprazole  40 mg Oral Daily   polyethylene glycol  17 g Oral Daily   senna  1 tablet Oral Daily   sodium chloride flush  3 mL Intravenous Q12H   spironolactone  25 mg Oral Daily   Warfarin - Pharmacist Dosing Inpatient   Does not apply q1600    Infusions:  sodium chloride Stopped (09/30/22 1155)    PRN Medications: sodium chloride, acetaminophen, albuterol, dextrose, morphine injection, ondansetron (ZOFRAN) IV, mouth rinse, oxyCODONE, sodium chloride flush, traMADol, traZODone   Patient Profile  72 y/o male w/ long standing h/o chronic systolic heart failure due to nonischemic cardiomyopathy, T2DM and hx of acute promyelocytic leukemia s/p ATRA ([facility] 2014). Recent development of rapidly progressive heart failure,  NYHA class IIIb-IV symptoms. Admitted for for RHC and optimization prior to VAD implant.   RHC w/ elevated filling pressures and low output (RA 13, PAP 64/26 (40), PCWP 23, CI 2 L/min/m2) =>started on Milrinone  2/14 S/P HMIII VAD Assessment/Plan:    1. Chronic HFrEF, NICM -> 09/24/22 S/P HMIII LVAD: Off milrinone. Speed increased to 5400 after 2/21 echo and to 5500 after 2/23 echo.  Good flow at 4.2. Central line out.  Not significantly volume overloaded and weight below baseline.  INR 1.8, LDH 196.   - Will do 80 IV lasix this morning x1. Will follow with 40 PO lasix daily until follow-up (3/5) - Continue spironolactone 25 daily.  - Continue Jardiance 10 mg  - no other GDMT yet w/ MAP in low 70s 2. Acute blood loss anemia: Hgb stable, 9.2  3. NSVT/PVCS: Rare PVCs. - cont amiodarone 200 mg daily.   4.  Hx of promyelocytic leukemia: S/P ATRA in 2014 at [facility].  5.  Hx of endocarditis: 2015 6.  Nausea/poor appetite: Improved.  7. Altered mental status: Noted 2/23, delirium (no sleep x 2 days).  CT head without acute changes. Remains much clearer after good rest.   Plan to d/c later today. Has some further  teaching to complete but overall is ready for discharge. Equipment delivered yesterday.   Earnie Larsson, AGACNP-BC  10/07/2022 8:14 AM  Patient seen and examined with the above-signed Advanced Practice Provider and/or Housestaff. I personally reviewed laboratory data, imaging studies and relevant notes. I independently examined the patient and formulated the important aspects of the plan. I have edited the note to reflect any of my changes or salient points. I have personally discussed the plan with the patient and/or family.  Feels  good. Denies CP or SOB. Teaching complete. INR 1.8   VAD interrogated personally. Parameters stable.  General:  NAD.  HEENT: normal  Neck: supple. JVP not elevated.  Carotids 2+ bilat; no bruits. No lymphadenopathy or thryomegaly appreciated. Cor: LVAD hum.  Lungs: Clear. Abdomen: obese soft, nontender, non-distended. No hepatosplenomegaly. No bruits or masses. Good bowel sounds. Driveline site clean. Anchor in place.  Extremities: no cyanosis, clubbing, rash. 1+ edema Neuro: alert & oriented x 3. No focal deficits. Moves all 4 without problem   Looks good. Mild edema. Will give 1 dose IV lasix this am. Plan for d/c later today.   Glori Bickers, MD  12:25 PM

## 2022-10-07 NOTE — TOC Transition Note (Signed)
Transition of Care [facility]) - CM/SW Discharge Note   Patient Details  Name: Ricardo Zuniga MRN: [id number] Date of Birth: October 19, 1950  Transition of Care [facility]) CM/SW Contact:  Cyndi Bender, RN Phone Number: 10/07/2022, 2:20 PM   Clinical Narrative:     Patient stable for discharge.  Notified Amy with [employer] of discharge today.  Patient has all needed DME.  Final next level of care: Home w Home Health Services Barriers to Discharge: Barriers Resolved   Patient Goals and CMS Choice CMS Medicare.gov Compare Post Acute Care list provided to:: Patient Represenative (must comment) Choice offered to / list presented to : Spouse  Discharge Placement                         Discharge Plan and Services Additional resources added to the After Visit Summary for     Discharge Planning Services: CM Consult Post Acute Care Choice: Home Health          DME Arranged: Walker rolling with seat, 3-N-1 DME Agency: [employer] Date DME Agency Contacted: 10/06/22 Time DME Agency Contacted: 1104 Representative spoke with at DME Agency: Marjie Skiff HH Arranged: PT, OT [facility] Agency: [place] Date [place]: 10/06/22 Time [place]: 1109 Representative spoke with at [place]: [place] ([place]) Interventions SDOH Screenings   Food Insecurity: No Food Insecurity (09/23/2022)  Housing: [place]  (09/23/2022)  Transportation Needs: No Transportation Needs (09/23/2022)  Utilities: Not At Risk (09/23/2022)  Depression (PHQ2-9): Low Risk  (09/03/2022)  Financial Resource Strain: Low Risk  (09/03/2022)  Tobacco Use: Medium Risk (09/25/2022)     Readmission Risk Interventions    10/07/2022    2:17 PM 11/01/2021    3:26 PM  Readmission Risk Prevention Plan  Transportation Screening Complete Complete  HRI or [place]  Complete  Social Work Consult for [place] Planning/Counseling  Complete  Palliative Care Screening   Not Applicable  Medication Review Press photographer) Complete Complete  PCP or Specialist appointment within 3-5 days of discharge Complete   HRI or [place] Complete   SW Recovery Care/Counseling Consult Complete   [place] Not Applicable

## 2022-10-09 DIAGNOSIS — I428 Other cardiomyopathies: Secondary | ICD-10-CM | POA: Diagnosis not present

## 2022-10-09 DIAGNOSIS — I471 Supraventricular tachycardia, unspecified: Secondary | ICD-10-CM | POA: Diagnosis not present

## 2022-10-09 DIAGNOSIS — Z95811 Presence of heart assist device: Secondary | ICD-10-CM | POA: Diagnosis not present

## 2022-10-09 DIAGNOSIS — I493 Ventricular premature depolarization: Secondary | ICD-10-CM | POA: Diagnosis not present

## 2022-10-09 DIAGNOSIS — E1140 Type 2 diabetes mellitus with diabetic neuropathy, unspecified: Secondary | ICD-10-CM | POA: Diagnosis not present

## 2022-10-09 DIAGNOSIS — Z7984 Long term (current) use of oral hypoglycemic drugs: Secondary | ICD-10-CM | POA: Diagnosis not present

## 2022-10-09 DIAGNOSIS — I5023 Acute on chronic systolic (congestive) heart failure: Secondary | ICD-10-CM | POA: Diagnosis not present

## 2022-10-09 DIAGNOSIS — D62 Acute posthemorrhagic anemia: Secondary | ICD-10-CM | POA: Diagnosis not present

## 2022-10-09 DIAGNOSIS — Z7901 Long term (current) use of anticoagulants: Secondary | ICD-10-CM | POA: Diagnosis not present

## 2022-10-10 ENCOUNTER — Other Ambulatory Visit (HOSPITAL_COMMUNITY): Payer: Self-pay

## 2022-10-10 DIAGNOSIS — Z09 Encounter for follow-up examination after completed treatment for conditions other than malignant neoplasm: Secondary | ICD-10-CM | POA: Diagnosis not present

## 2022-10-10 DIAGNOSIS — Z7901 Long term (current) use of anticoagulants: Secondary | ICD-10-CM

## 2022-10-10 DIAGNOSIS — Z95811 Presence of heart assist device: Secondary | ICD-10-CM | POA: Diagnosis not present

## 2022-10-10 DIAGNOSIS — I10 Essential (primary) hypertension: Secondary | ICD-10-CM | POA: Diagnosis not present

## 2022-10-10 DIAGNOSIS — I420 Dilated cardiomyopathy: Secondary | ICD-10-CM

## 2022-10-10 DIAGNOSIS — I509 Heart failure, unspecified: Secondary | ICD-10-CM | POA: Diagnosis not present

## 2022-10-10 DIAGNOSIS — E1165 Type 2 diabetes mellitus with hyperglycemia: Secondary | ICD-10-CM | POA: Diagnosis not present

## 2022-10-11 ENCOUNTER — Other Ambulatory Visit: Payer: Self-pay | Admitting: Cardiology

## 2022-10-14 ENCOUNTER — Inpatient Hospital Stay (HOSPITAL_COMMUNITY)
Admission: RE | Admit: 2022-10-14 | Discharge: 2022-10-14 | Disposition: A | Discharge status: Home / Self Care | Payer: Medicare PPO | Source: Ambulatory Visit

## 2022-10-14 ENCOUNTER — Ambulatory Visit (HOSPITAL_COMMUNITY): Payer: Self-pay | Admitting: Pharmacist

## 2022-10-14 ENCOUNTER — Telehealth (HOSPITAL_COMMUNITY): Payer: Self-pay

## 2022-10-14 ENCOUNTER — Other Ambulatory Visit (HOSPITAL_COMMUNITY): Payer: Self-pay

## 2022-10-14 ENCOUNTER — Ambulatory Visit (HOSPITAL_COMMUNITY)
Admit: 2022-10-14 | Discharge: 2022-10-14 | Disposition: A | Discharge status: Home / Self Care | Payer: Medicare PPO | Attending: Cardiology | Admitting: Cardiology

## 2022-10-14 VITALS — BP 99/70 | HR 85 | Ht 69.0 in | Wt 177.2 lb

## 2022-10-14 DIAGNOSIS — Z95811 Presence of heart assist device: Secondary | ICD-10-CM | POA: Diagnosis not present

## 2022-10-14 DIAGNOSIS — I5022 Chronic systolic (congestive) heart failure: Secondary | ICD-10-CM | POA: Diagnosis not present

## 2022-10-14 DIAGNOSIS — I493 Ventricular premature depolarization: Secondary | ICD-10-CM

## 2022-10-14 DIAGNOSIS — Z7901 Long term (current) use of anticoagulants: Secondary | ICD-10-CM

## 2022-10-14 DIAGNOSIS — Z79899 Other long term (current) drug therapy: Secondary | ICD-10-CM | POA: Diagnosis not present

## 2022-10-14 DIAGNOSIS — Z5181 Encounter for therapeutic drug level monitoring: Secondary | ICD-10-CM | POA: Diagnosis not present

## 2022-10-14 DIAGNOSIS — C9241 Acute promyelocytic leukemia, in remission: Secondary | ICD-10-CM

## 2022-10-14 LAB — CBC
HCT: 31.9 % — ABNORMAL LOW (ref 39.0–52.0)
Hemoglobin: 10.3 g/dL — ABNORMAL LOW (ref 13.0–17.0)
MCH: 29.9 pg (ref 26.0–34.0)
MCHC: 32.3 g/dL (ref 30.0–36.0)
MCV: 92.7 fL (ref 80.0–100.0)
Platelets: 315 10*3/uL (ref 150–400)
RBC: 3.44 MIL/uL — ABNORMAL LOW (ref 4.22–5.81)
RDW: 16.5 % — ABNORMAL HIGH (ref 11.5–15.5)
WBC: 6.1 10*3/uL (ref 4.0–10.5)
nRBC: 0 % (ref 0.0–0.2)

## 2022-10-14 LAB — BASIC METABOLIC PANEL
Anion gap: 9 (ref 5–15)
BUN: 17 mg/dL (ref 8–23)
CO2: 24 mmol/L (ref 22–32)
Calcium: 9.2 mg/dL (ref 8.9–10.3)
Chloride: 102 mmol/L (ref 98–111)
Creatinine, Ser: 1.23 mg/dL (ref 0.61–1.24)
GFR, Estimated: >60 mL/min (ref >60)
Glucose, Bld: 295 mg/dL — ABNORMAL HIGH (ref 70–99)
Potassium: 4.4 mmol/L (ref 3.5–5.1)
Sodium: 135 mmol/L (ref 135–145)

## 2022-10-14 LAB — LACTATE DEHYDROGENASE: LDH: 177 U/L (ref 98–192)

## 2022-10-14 LAB — DIGOXIN LEVEL: Digoxin Level: 0.6 ng/mL — ABNORMAL LOW (ref 0.8–2.0)

## 2022-10-14 LAB — PROTIME-INR
INR: 4.2 (ref 0.8–1.2)
Prothrombin Time: 40.2 seconds — ABNORMAL HIGH (ref 11.4–15.2)

## 2022-10-14 MED ORDER — DIGOXIN 62.5 MCG PO TABS: 0.0625 mg | ORAL_TABLET | Freq: Every day | ORAL | 5 refills | Status: DC

## 2022-10-14 MED ORDER — ADULT MULTIVITAMIN W/MINERALS CH: 1.0000 | ORAL_TABLET | Freq: Every day | ORAL | 5 refills | Status: DC

## 2022-10-14 NOTE — Patient Instructions (Signed)
Take an extra Lasix 40 mg today Advance dressing changes to every other day without the silver strip. We have placed a Zio patch today for 7 days Return to clinic in 1 week

## 2022-10-14 NOTE — Progress Notes (Signed)
Patient presents for hosp follow up in [facility] today following VAD implant on 09/24/22. Reports no problems with VAD equipment or concerns with drive line.  Pt was d/c last week and states that he is "doing great." Pt tells me that he walked 1/2 mile yesterday. He tells me he is eating great and is trying to do more and more everyday. Pt is only taking a tylenol "every now and then" for pain.   Pt denies any SOB, signs of bleeding or swelling. He states that he is working on trying to get his batteries and controller in a fanny pack. He is very grateful for the VAD team and Dr Daniel Nones.  Pts wife [name] is changing his driveline every day. See full not below.   Vital Signs:  Doppler Pressure 108   Automatc BP: 99/70 (84) HR: 85   SPO2: 97  %   Weight: 177.2 lb w/o eqt Last weight: 172.2 lb Home weights: 167 lbs this morning    VAD Indication: Destination Therapy for age   VAD interrogation & Equipment Management: Speed: 5500 Flow: 4.3 Power:4.2 w    PI: 3.4   Alarms: no clinical alarms Events: rare  Fixed speed 5500 Low speed limit: 5200   Primary Controller:  Replace back up battery in 70month. Back up controller:   Replace back up battery in 30 months.   Annual Equipment Maintenance on UBC/PM was performed on 10/07/22.    I reviewed the LVAD parameters from today and compared the results to the patient's prior recorded data. LVAD interrogation was NEGATIVE for significant power changes, NEGATIVE for clinical alarms and STABLE for PI events/speed drops. No programming changes were made and pump is functioning within specified parameters. Pt is performing daily controller and system monitor self tests along with completing weekly and monthly maintenance for LVAD equipment.   LVAD equipment check completed and is in good working order. Back-up equipment present. Charged back up battery and performed self-test on equipment.    Exit Site Care: Drive line is being  maintained daily by his wife VLorriane Shire  Existing VAD dressing removed and site care performed using sterile technique. Drive line exit site cleaned with Chlora prep applicators x 2, allowed to dry, and gauze dressing withOUT Silver strip applied. Drive line exit site is healing and unincorporated. The velour is fully implanted at exit site. Dressing dry and intact. Slight erythema, scant serous drainage, no tenderness. There is slight rash under anchor. I have switched pt to cath grip anchor today. Pt denies fever or chills. Advance to every other day dressing changes. Pt given 8 cath grip anchors in clinic today.      Device: none    BP & Labs: MAP 84 - Doppler is reflecting modified systolic   Hgb 1123456- No S/S of bleeding. Specifically denies melena/BRBPR or nosebleeds.   LDH stable at 177 with established baseline of 170 - 208. Denies tea-colored urine. No power elevations noted on interrogation.    Plan: Take an extra Lasix 40 mg today Advance dressing changes to every other day without the silver strip. We have placed a Zio patch today for 7 days Return to clinic in 1 week   STanda RockersBSN, RN [place] BayCoordinator   Office: [phone] Emergency VAD Pager: [phone]

## 2022-10-14 NOTE — Telephone Encounter (Signed)
Critical INR called in - 4.2, PT is 40.2

## 2022-10-14 NOTE — Progress Notes (Signed)
LVAD INR    

## 2022-10-15 ENCOUNTER — Other Ambulatory Visit (HOSPITAL_COMMUNITY): Payer: Self-pay | Admitting: Cardiology

## 2022-10-15 DIAGNOSIS — Z7901 Long term (current) use of anticoagulants: Secondary | ICD-10-CM | POA: Diagnosis not present

## 2022-10-15 DIAGNOSIS — I428 Other cardiomyopathies: Secondary | ICD-10-CM | POA: Diagnosis not present

## 2022-10-15 DIAGNOSIS — I493 Ventricular premature depolarization: Secondary | ICD-10-CM

## 2022-10-15 DIAGNOSIS — E1140 Type 2 diabetes mellitus with diabetic neuropathy, unspecified: Secondary | ICD-10-CM | POA: Diagnosis not present

## 2022-10-15 DIAGNOSIS — I5023 Acute on chronic systolic (congestive) heart failure: Secondary | ICD-10-CM | POA: Diagnosis not present

## 2022-10-15 DIAGNOSIS — D62 Acute posthemorrhagic anemia: Secondary | ICD-10-CM | POA: Diagnosis not present

## 2022-10-15 DIAGNOSIS — Z95811 Presence of heart assist device: Secondary | ICD-10-CM | POA: Diagnosis not present

## 2022-10-15 DIAGNOSIS — Z7984 Long term (current) use of oral hypoglycemic drugs: Secondary | ICD-10-CM | POA: Diagnosis not present

## 2022-10-15 DIAGNOSIS — I471 Supraventricular tachycardia, unspecified: Secondary | ICD-10-CM | POA: Diagnosis not present

## 2022-10-16 DIAGNOSIS — I5023 Acute on chronic systolic (congestive) heart failure: Secondary | ICD-10-CM | POA: Diagnosis not present

## 2022-10-16 DIAGNOSIS — Z7901 Long term (current) use of anticoagulants: Secondary | ICD-10-CM | POA: Diagnosis not present

## 2022-10-16 DIAGNOSIS — E1140 Type 2 diabetes mellitus with diabetic neuropathy, unspecified: Secondary | ICD-10-CM | POA: Diagnosis not present

## 2022-10-16 DIAGNOSIS — I428 Other cardiomyopathies: Secondary | ICD-10-CM | POA: Diagnosis not present

## 2022-10-16 DIAGNOSIS — Z7984 Long term (current) use of oral hypoglycemic drugs: Secondary | ICD-10-CM | POA: Diagnosis not present

## 2022-10-16 DIAGNOSIS — I471 Supraventricular tachycardia, unspecified: Secondary | ICD-10-CM | POA: Diagnosis not present

## 2022-10-16 DIAGNOSIS — I493 Ventricular premature depolarization: Secondary | ICD-10-CM | POA: Diagnosis not present

## 2022-10-16 DIAGNOSIS — D62 Acute posthemorrhagic anemia: Secondary | ICD-10-CM | POA: Diagnosis not present

## 2022-10-16 DIAGNOSIS — Z95811 Presence of heart assist device: Secondary | ICD-10-CM | POA: Diagnosis not present

## 2022-10-17 DIAGNOSIS — D62 Acute posthemorrhagic anemia: Secondary | ICD-10-CM | POA: Diagnosis not present

## 2022-10-17 DIAGNOSIS — I471 Supraventricular tachycardia, unspecified: Secondary | ICD-10-CM | POA: Diagnosis not present

## 2022-10-17 DIAGNOSIS — Z95811 Presence of heart assist device: Secondary | ICD-10-CM | POA: Diagnosis not present

## 2022-10-17 DIAGNOSIS — E1140 Type 2 diabetes mellitus with diabetic neuropathy, unspecified: Secondary | ICD-10-CM | POA: Diagnosis not present

## 2022-10-17 DIAGNOSIS — Z7901 Long term (current) use of anticoagulants: Secondary | ICD-10-CM | POA: Diagnosis not present

## 2022-10-17 DIAGNOSIS — I493 Ventricular premature depolarization: Secondary | ICD-10-CM | POA: Diagnosis not present

## 2022-10-17 DIAGNOSIS — I5023 Acute on chronic systolic (congestive) heart failure: Secondary | ICD-10-CM | POA: Diagnosis not present

## 2022-10-17 DIAGNOSIS — Z7984 Long term (current) use of oral hypoglycemic drugs: Secondary | ICD-10-CM | POA: Diagnosis not present

## 2022-10-17 DIAGNOSIS — I428 Other cardiomyopathies: Secondary | ICD-10-CM | POA: Diagnosis not present

## 2022-10-19 DIAGNOSIS — I420 Dilated cardiomyopathy: Secondary | ICD-10-CM | POA: Diagnosis not present

## 2022-10-20 ENCOUNTER — Other Ambulatory Visit (HOSPITAL_COMMUNITY): Payer: Self-pay

## 2022-10-20 DIAGNOSIS — I428 Other cardiomyopathies: Secondary | ICD-10-CM | POA: Diagnosis not present

## 2022-10-20 DIAGNOSIS — Z7984 Long term (current) use of oral hypoglycemic drugs: Secondary | ICD-10-CM | POA: Diagnosis not present

## 2022-10-20 DIAGNOSIS — E1140 Type 2 diabetes mellitus with diabetic neuropathy, unspecified: Secondary | ICD-10-CM | POA: Diagnosis not present

## 2022-10-20 DIAGNOSIS — I471 Supraventricular tachycardia, unspecified: Secondary | ICD-10-CM | POA: Diagnosis not present

## 2022-10-20 DIAGNOSIS — D62 Acute posthemorrhagic anemia: Secondary | ICD-10-CM | POA: Diagnosis not present

## 2022-10-20 DIAGNOSIS — I493 Ventricular premature depolarization: Secondary | ICD-10-CM | POA: Diagnosis not present

## 2022-10-20 DIAGNOSIS — Z7901 Long term (current) use of anticoagulants: Secondary | ICD-10-CM

## 2022-10-20 DIAGNOSIS — I5023 Acute on chronic systolic (congestive) heart failure: Secondary | ICD-10-CM | POA: Diagnosis not present

## 2022-10-20 DIAGNOSIS — Z95811 Presence of heart assist device: Secondary | ICD-10-CM

## 2022-10-21 ENCOUNTER — Encounter (HOSPITAL_COMMUNITY): Payer: Self-pay | Admitting: Cardiology

## 2022-10-21 ENCOUNTER — Ambulatory Visit (HOSPITAL_COMMUNITY)
Admission: RE | Admit: 2022-10-21 | Discharge: 2022-10-21 | Disposition: A | Discharge status: Home / Self Care | Payer: Medicare PPO | Source: Ambulatory Visit | Attending: Cardiology | Admitting: Cardiology

## 2022-10-21 ENCOUNTER — Ambulatory Visit (HOSPITAL_COMMUNITY): Payer: Self-pay | Admitting: Pharmacist

## 2022-10-21 VITALS — BP 120/72 | HR 87 | Wt 173.8 lb

## 2022-10-21 DIAGNOSIS — Z7901 Long term (current) use of anticoagulants: Secondary | ICD-10-CM | POA: Insufficient documentation

## 2022-10-21 DIAGNOSIS — Z95811 Presence of heart assist device: Secondary | ICD-10-CM | POA: Diagnosis not present

## 2022-10-21 DIAGNOSIS — I493 Ventricular premature depolarization: Secondary | ICD-10-CM

## 2022-10-21 DIAGNOSIS — I5022 Chronic systolic (congestive) heart failure: Secondary | ICD-10-CM | POA: Diagnosis not present

## 2022-10-21 LAB — BASIC METABOLIC PANEL
Anion gap: 9 (ref 5–15)
BUN: 19 mg/dL (ref 8–23)
CO2: 23 mmol/L (ref 22–32)
Calcium: 9.5 mg/dL (ref 8.9–10.3)
Chloride: 104 mmol/L (ref 98–111)
Creatinine, Ser: 0.85 mg/dL (ref 0.61–1.24)
GFR, Estimated: >60 mL/min (ref >60)
Glucose, Bld: 128 mg/dL — ABNORMAL HIGH (ref 70–99)
Potassium: 4.5 mmol/L (ref 3.5–5.1)
Sodium: 136 mmol/L (ref 135–145)

## 2022-10-21 LAB — CBC
HCT: 36.6 % — ABNORMAL LOW (ref 39.0–52.0)
Hemoglobin: 11.6 g/dL — ABNORMAL LOW (ref 13.0–17.0)
MCH: 29.3 pg (ref 26.0–34.0)
MCHC: 31.7 g/dL (ref 30.0–36.0)
MCV: 92.4 fL (ref 80.0–100.0)
Platelets: 309 10*3/uL (ref 150–400)
RBC: 3.96 MIL/uL — ABNORMAL LOW (ref 4.22–5.81)
RDW: 16 % — ABNORMAL HIGH (ref 11.5–15.5)
WBC: 6.5 10*3/uL (ref 4.0–10.5)
nRBC: 0 % (ref 0.0–0.2)

## 2022-10-21 LAB — PROTIME-INR
INR: 1.9 — ABNORMAL HIGH (ref 0.8–1.2)
Prothrombin Time: 21.8 seconds — ABNORMAL HIGH (ref 11.4–15.2)

## 2022-10-21 LAB — LACTATE DEHYDROGENASE: LDH: 195 U/L — ABNORMAL HIGH (ref 98–192)

## 2022-10-21 MED ORDER — CERTAVITE/ANTIOXIDANTS PO TABS: 1.0000 | ORAL_TABLET | Freq: Every day | ORAL | 3 refills | Status: DC

## 2022-10-21 MED ORDER — FUROSEMIDE 20 MG PO TABS: 20.0000 mg | ORAL_TABLET | Freq: Every day | ORAL | 3 refills | Status: DC

## 2022-10-21 NOTE — Progress Notes (Signed)
Patient presents for 1 week follow up in [facility] today with his wife Ricardo Zuniga. Reports no problems with VAD equipment or concerns with drive line.  Pt states he has been feeling great over the last week. Reports he has increased his physical activity since last week. He was able to walk upstairs to his den. He is working with home health PT a couple times a week. Reports limiting factor is his chronic back and shoulder pain. He is taking PRN Tylenol and this helps.   Denies lightheadedness, dizziness, SOB, signs of bleeding or swelling. He is concerned that he is not gaining weight with increased appetite. Weight is down 4 lbs today. Discussed that weight loss is not uncommon after surgery, and encouraged to continue good PO intake. He is wearing a new LVAD equipment vest today and he loves it. He is very grateful for the VAD team and Dr Daniel Nones.  Will stop ASA today per protocol. Fluid status stable. Per Dr Daniel Nones will decrease Lasix to 20 mg daily. Pt and wife verbalized understanding to notify VAD coordinators if they feel fluid is increasing.   Zio patch removed today in clinic. Pt plans to mail back today.  Pts wife Ricardo Zuniga is changing his driveline every other day. See documentation below. Advanced dressing changes to twice a week using daily kit.    Vital Signs:  Doppler Pressure 112  Automatc BP: 120/72 (89) HR: 87  SPO2: 98%   Weight: 173.8 lb w/o eqt Last weight: 177.2 lb Home weights: 163 lbs this morning    VAD Indication: Destination Therapy for age   VAD interrogation & Equipment Management: Speed: 5500 Flow: 4.0 Power: 4.2 w    PI: 4.5   Alarms: none Events: rare  Fixed speed 5500 Low speed limit: 5200   Primary Controller:  Replace back up battery in 31 months. Back up controller:   Replace back up battery in 30 months.   Annual Equipment Maintenance on UBC/PM was performed on 10/07/22.    I reviewed the LVAD parameters from today and compared the  results to the patient's prior recorded data. LVAD interrogation was NEGATIVE for significant power changes, NEGATIVE for clinical alarms and STABLE for PI events/speed drops. No programming changes were made and pump is functioning within specified parameters. Pt is performing daily controller and system monitor self tests along with completing weekly and monthly maintenance for LVAD equipment.   LVAD equipment check completed and is in good working order. Back-up equipment present. Charged back up battery and performed self-test on equipment.    Exit Site Care: Drive line is being maintained every other day by his wife Ricardo Zuniga.  Existing VAD dressing removed and site care performed using sterile technique. Drive line exit site cleaned with Chlora prep applicators x 2, allowed to dry, and gauze dressing withOUT Silver strip applied. Drive line exit site is healing and partially incorporated. The velour is fully implanted at exit site. Large scab present at exit site. Slight erythema, scant serous drainage, no tenderness. There is slight rash under previous anchor site- Ricardo Zuniga reports reaction occurred with cath-grip anchor. I have switched pt back to foley anchor today. Pt denies fever or chills. Advance to twice weekly dressing changes. Pt given 7 daily kits and 10 foley anchors for home use.     Device: none    BP & Labs: MAP 112 - Doppler is reflecting modified systolic   Hgb 123XX123 - No S/S of bleeding. Specifically denies melena/BRBPR or nosebleeds.  LDH stable at 195 with established baseline of 170 - 208. Denies tea-colored urine. No power elevations noted on interrogation.    Plan: Stop Aspirin Decrease Lasix to 20 mg daily Coumadin dosing per Lauren PharmD Return to [place] [facility] in 2 weeks. We will complete an echo at this visit May advance dressing changes to twice a week  Emerson Monte RN [place] Coordinator  Office: [phone]  24/7 Pager: [phone]

## 2022-10-21 NOTE — Patient Instructions (Addendum)
Stop Aspirin Decrease Lasix to 20 mg daily Coumadin dosing per Lauren PharmD Return to [place] clinic in 2 weeks. We will complete an echo at this visit May advance dressing changes to twice a week

## 2022-10-22 ENCOUNTER — Telehealth: Payer: Self-pay | Admitting: *Deleted

## 2022-10-22 ENCOUNTER — Other Ambulatory Visit (HOSPITAL_COMMUNITY): Payer: Self-pay | Admitting: *Deleted

## 2022-10-22 DIAGNOSIS — Z7901 Long term (current) use of anticoagulants: Secondary | ICD-10-CM

## 2022-10-22 DIAGNOSIS — Z95811 Presence of heart assist device: Secondary | ICD-10-CM

## 2022-10-22 NOTE — Telephone Encounter (Signed)
Spoke with pt regarding need to change follow up appt on 11/06/22 to 1 pm to facilitate timing for ramp echo. Pt verbalized understanding. Appt updated in Epic.   Emerson Monte RN [place] Coordinator  Office: [phone]  24/7 Pager: [phone]

## 2022-10-23 ENCOUNTER — Telehealth: Payer: Self-pay | Admitting: *Deleted

## 2022-10-23 DIAGNOSIS — Z95811 Presence of heart assist device: Secondary | ICD-10-CM | POA: Diagnosis not present

## 2022-10-23 DIAGNOSIS — I5023 Acute on chronic systolic (congestive) heart failure: Secondary | ICD-10-CM | POA: Diagnosis not present

## 2022-10-23 DIAGNOSIS — I471 Supraventricular tachycardia, unspecified: Secondary | ICD-10-CM | POA: Diagnosis not present

## 2022-10-23 DIAGNOSIS — Z7984 Long term (current) use of oral hypoglycemic drugs: Secondary | ICD-10-CM | POA: Diagnosis not present

## 2022-10-23 DIAGNOSIS — I428 Other cardiomyopathies: Secondary | ICD-10-CM | POA: Diagnosis not present

## 2022-10-23 DIAGNOSIS — Z7901 Long term (current) use of anticoagulants: Secondary | ICD-10-CM | POA: Diagnosis not present

## 2022-10-23 DIAGNOSIS — E1140 Type 2 diabetes mellitus with diabetic neuropathy, unspecified: Secondary | ICD-10-CM | POA: Diagnosis not present

## 2022-10-23 DIAGNOSIS — D62 Acute posthemorrhagic anemia: Secondary | ICD-10-CM | POA: Diagnosis not present

## 2022-10-23 DIAGNOSIS — I493 Ventricular premature depolarization: Secondary | ICD-10-CM | POA: Diagnosis not present

## 2022-10-23 NOTE — Telephone Encounter (Signed)
Entered in error

## 2022-10-24 DIAGNOSIS — I509 Heart failure, unspecified: Secondary | ICD-10-CM | POA: Diagnosis not present

## 2022-10-24 DIAGNOSIS — Z299 Encounter for prophylactic measures, unspecified: Secondary | ICD-10-CM | POA: Diagnosis not present

## 2022-10-24 DIAGNOSIS — I10 Essential (primary) hypertension: Secondary | ICD-10-CM | POA: Diagnosis not present

## 2022-10-24 DIAGNOSIS — E1165 Type 2 diabetes mellitus with hyperglycemia: Secondary | ICD-10-CM | POA: Diagnosis not present

## 2022-10-27 DIAGNOSIS — I471 Supraventricular tachycardia, unspecified: Secondary | ICD-10-CM | POA: Diagnosis not present

## 2022-10-27 DIAGNOSIS — Z7984 Long term (current) use of oral hypoglycemic drugs: Secondary | ICD-10-CM | POA: Diagnosis not present

## 2022-10-27 DIAGNOSIS — I428 Other cardiomyopathies: Secondary | ICD-10-CM | POA: Diagnosis not present

## 2022-10-27 DIAGNOSIS — D62 Acute posthemorrhagic anemia: Secondary | ICD-10-CM | POA: Diagnosis not present

## 2022-10-27 DIAGNOSIS — Z7901 Long term (current) use of anticoagulants: Secondary | ICD-10-CM | POA: Diagnosis not present

## 2022-10-27 DIAGNOSIS — I5023 Acute on chronic systolic (congestive) heart failure: Secondary | ICD-10-CM | POA: Diagnosis not present

## 2022-10-27 DIAGNOSIS — Z95811 Presence of heart assist device: Secondary | ICD-10-CM | POA: Diagnosis not present

## 2022-10-27 DIAGNOSIS — I493 Ventricular premature depolarization: Secondary | ICD-10-CM | POA: Diagnosis not present

## 2022-10-27 DIAGNOSIS — E1140 Type 2 diabetes mellitus with diabetic neuropathy, unspecified: Secondary | ICD-10-CM | POA: Diagnosis not present

## 2022-10-27 NOTE — Progress Notes (Signed)
ADVANCED HEART FAILURE CLINIC NOTE  Referring Physician: Glenda Chroman, MD  Primary Care: Glenda Chroman, MD Primary Cardiologist: Charletta Cousin Heart Failure: Ricardo Zuniga  HPI: Ricardo Zuniga is a very pleasant 72 YO WM w/ HFrEF 2/2 nonischemic cardiomyopathy, T2DM and hx of acute promyelocytic leukemia s/p ATRA ([facility] 2014) that was initially see at [facility] in October 2023 after presenting with a 1 week history of orthopnea, PND & LE edema. He had a TTE prior to admission w/ LVEF of 40-45%. Echo during admit was notable for LVEF of 30%-35%. LHC/RHC during admit w/ nonobstructive CAD & moderately reduced CI (2.1 L/min/m2). His cardiac history dates back to 2015 when he was found to have an LVEF of 25% felt to be triggered by endocarditis. He had improvement in LV function for several years, however, unfortunately had a fairly rapid decline in LVEF and functional status over the winter months of 2023. The decision was made to proceed with HMIII implantation. Ricardo Zuniga underwent successful implant on 09/24/22 as DT. His post-operative course was fairly unremarkable. He had rapid improvement in PA pressures after HMIII implantation.   Today Ricardo Zuniga presents for his first outpatient visit since [place] implant. He is doing very well and in good spirits overall. No reported difficulty with managing LVAD therapy. He reports that his SOB, LE edema and overall fatigue has improved significantly. He still feels deconditioned after his lengthy hospitalization. No lightheadedness, minimal chest pain, no fevers,chills, SOB.   Past Medical History:  Diagnosis Date   Anal fissure    APML (acute promyelocytic leukemia) in remission ([place])    Completed treatment 04/2013   CHF (congestive heart failure) (HCC)    Coronary artery disease    Nonobstructive at cardiac catheterization 09/2018   Difficult intubation    w/shoulder surgery at surgical center between 2005-2008   Gastroesophageal  reflux disease    History of blood transfusion 2014   History of kidney stones    passed stones   Hypercholesteremia    Neuropathy    bilateral feet   Nonischemic cardiomyopathy ([place])    a. EF 20% in 2015 - thought to possibly be viral induced or chemotherapy induced from treatments in 2014 b. at 35-40% by echo in 05/2016    Peptic ulcer disease    Pneumonia    Yrs ago   Type 2 diabetes mellitus ([place])    Ureteral colic    Vertigo    tx with valium prn   Wears glasses    Wears hearing aid in both ears     Current Outpatient Medications  Medication Sig Dispense Refill   acetaminophen (TYLENOL) 650 MG CR tablet Take 650-1,300 mg by mouth every 8 (eight) hours as needed for pain.     amiodarone (PACERONE) 200 MG tablet Take 1 tablet (200 mg total) by mouth daily. 30 tablet 5   atorvastatin (LIPITOR) 40 MG tablet Take 1 tablet by mouth once daily 90 tablet 1   Cholecalciferol (VITAMIN D3) 125 MCG (5000 UT) TABS Take 5,000 Units by mouth in the morning.     docusate sodium (COLACE) 100 MG capsule Take 200 mg by mouth every evening.     empagliflozin (JARDIANCE) 10 MG TABS tablet Take 10 mg by mouth every evening.     omeprazole (PRILOSEC) 20 MG capsule Take 20 mg by mouth daily before breakfast.     potassium chloride SA (KLOR-CON M) 20 MEQ tablet Take 1 tablet (20 mEq total) by  mouth daily. 60 tablet 3   repaglinide (PRANDIN) 2 MG tablet Take 2-4 mg by mouth See admin instructions. Take 2 tablets (4 mg) by mouth in the morning & take 1 tablet (2 mg) by mouth in the evening.     spironolactone (ALDACTONE) 25 MG tablet Take 1 tablet (25 mg total) by mouth daily. 45 tablet 3   traZODone (DESYREL) 100 MG tablet Take 1 tablet (100 mg total) by mouth at bedtime as needed for sleep. 30 tablet 3   warfarin (COUMADIN) 5 MG tablet Take 1 tablet (5 mg) by mouth daily except half-tablet (2.5 mg) by mouth on Tuesday and Thursday 30 tablet 11   albuterol (VENTOLIN HFA) 108 (90 Base) MCG/ACT inhaler  Inhale 2 puffs into the lungs every 6 (six) hours as needed for wheezing or shortness of breath. (Patient not taking: Reported on 10/14/2022) 8 g 2   diazepam (VALIUM) 5 MG tablet Take 5 mg by mouth daily as needed (vertigo). (Patient not taking: Reported on 10/14/2022)     Digoxin 62.5 MCG TABS Take 1 tablet by mouth daily. 30 tablet 5   furosemide (LASIX) 20 MG tablet Take 1 tablet (20 mg total) by mouth daily. 90 tablet 3   guaiFENesin (MUCINEX) 600 MG 12 hr tablet Take 600 mg by mouth daily as needed (congestion.). (Patient not taking: Reported on 10/14/2022)     Multiple Vitamin (MULTIVITAMIN WITH MINERALS) TABS tablet Take 1 tablet by mouth daily. (Patient not taking: Reported on 10/21/2022) 30 tablet 5   Multiple Vitamins-Minerals (CERTAVITE/ANTIOXIDANTS) TABS Take 1 tablet by mouth daily. 90 tablet 3   ondansetron (ZOFRAN) 4 MG tablet Take 1 tablet (4 mg total) by mouth every 6 (six) hours as needed for nausea. (Patient not taking: Reported on 10/14/2022) 20 tablet 0   zinc gluconate 50 MG tablet Take 50 mg by mouth in the morning. (Patient not taking: Reported on 10/14/2022)     No current facility-administered medications for this encounter.    Allergies  Allergen Reactions   Zestril [Lisinopril] Cough      Social History   Socioeconomic History   Marital status: Married    Spouse name: Not on file   Number of children: Not on file   Years of education: Not on file   Highest education level: Not on file  Occupational History   Occupation: RETIRED    Comment: [employer]   Occupation: SECURITY GUARD  Tobacco Use   Smoking status: Former    Packs/day: 2.00    Years: 17.00    Additional pack years: 0.00    Total pack years: 34.00    Types: Cigarettes    Start date: 02/17/1968    Quit date: 02/28/1986    Years since quitting: 36.6   Smokeless tobacco: Never  Vaping Use   Vaping Use: Never used  Substance and Sexual Activity   Alcohol use: No    Alcohol/week: 0.0 standard  drinks of alcohol   Drug use: No   Sexual activity: Not Currently  Other Topics Concern   Not on file  Social History Narrative   Not on file   Social Determinants of Health   Financial Resource Strain: Low Risk  (09/03/2022)   Overall Financial Resource Strain (CARDIA)    Difficulty of Paying Living Expenses: Not hard at all  Food Insecurity: No Food Insecurity (09/23/2022)   Hunger Vital Sign    Worried About Running Out of Food in the Last Year: Never true  Ran Out of Food in the Last Year: Never true  Transportation Needs: No Transportation Needs (09/23/2022)   PRAPARE - Hydrologist (Medical): No    Lack of Transportation (Non-Medical): No  Physical Activity: Not on file  Stress: Not on file  Social Connections: Not on file  Intimate Partner Violence: Not At Risk (09/23/2022)   Humiliation, Afraid, Rape, and Kick questionnaire    Fear of Current or Ex-Partner: No    Emotionally Abused: No    Physically Abused: No    Sexually Abused: No      Family History  Problem Relation Age of Onset   Colon cancer Other     PHYSICAL EXAM: Vital Zuniga:  Doppler Pressure 108              Automatc BP: 99/70 (84) HR: 85   SPO2: 97  %   Weight: 177.2 lb w/o eqt Last weight: 172.2 lb Home weights: 167 lbs this morning   VAD interrogation & Equipment Management: Speed: 5500 Flow: 4.3 Power:4.2 w    PI: 3.4   Alarms: no clinical alarms Events: rare  Fixed speed 5500 Low speed limit: 5200  General: NAD, comfortable.  Neck: JVP <7 Lungs: Clear to auscultation bilaterally with normal respiratory effort. CV: Normal LVAD hum, radial pulse 1+ and intermittently palpable. Abdomen: soft, nontender, no hepatosplenomegaly, driveline w/o erythema or induration. Skin: Intact without lesions or rashes.  Neurologic: AAOX3 Psych: Normal affect. Extremities: No clubbing or cyanosis. Warm to touch with no edema.  HEENT: Normal.   ECHO: - Echo  (06/08/22): EF 30-35%, global HK, RV mildly reduced, mild MR   - Echo (7/23): EF 45%, RV normal   - Echo (8/22): EF 40-45%, RV normal   - Echo (7/21): EF 50%, RV normal  - LHC (09/2018): Moderate nonobstructive one-vessel coronary artery disease, prox RCA 40% stenosed.   - Echo (08/2018): EF 40%, RV normal  - Echo (05/2016): EF 35-40%, RV normal   - Echo (05/2014): EF 25-30%, RV mildly reduced  - Echo (05/2012): EF 50%, RV normal  - Echo (1/24): EF 25%, severe LV dilation, global hypokinesis, moderate RV dysfunction, mild RV enlargement, moderate AI, mild-moderate MR.  CATH: R/LHC (10/23): nonobstructive CAD RA 8 PA 52/25 (34 mean) PCWP 14 (mean) CO/CI (Fick): 4.3/2.1                                 PVR 4.6 WU          PAPi 3.4             RHC (1/24): RA mean 10 PA 69/28, mean 43 PCWP mean 20 CI 1.85 PVR 6.22 WU PAPi 4.1     ASSESSMENT & PLAN:  1. Chronic systolic heart failure s/p HMIII Destination therapy - Significant improvement in functional status since HMIII placement.  - Euvolemic on exam today with minimal PI events; no low flows or alarms.  - INR/BMP today  - Continue ASA 81mg , will D/C at next visit.  - Continue digoxin 62.15mcg for RV support. Plan for echo with ramp study at next visit.  - Continue jardiance 10mg , lasix 20mg .  - Continue spironolactone 25mg  daily.  - Continue amiodarone 200mg , place Ziopatch.  - MAPs at goal.   2. Hx of atrial tach, PVCs & NSVT - Continue amiodarone 200mg  BID - Place ZioPatch.   3. Hx of promyelocytic leukemia: S/P [facility]TRA in  2014 at [facility].   4. Hx of endocarditis: 2015  I reviewed the LVAD parameters from today and compared the results to the patient's prior recorded data. LVAD interrogation was NEGATIVE for significant power changes, NEGATIVE for clinical alarms and STABLE for PI events/speed drops. No programming changes were made and pump is functioning within specified parameters. Pt is performing daily controller and  system monitor self tests along with completing weekly and monthly maintenance for LVAD equipment.   Kahil Agner 10/27/2022 11:31 AM

## 2022-10-27 NOTE — Progress Notes (Signed)
ADVANCED HEART FAILURE CLINIC NOTE  Referring Physician: Glenda Chroman, MD  Primary Care: Ricardo Chroman, MD Primary Cardiologist: Ricardo Zuniga Heart Failure: Ricardo Zuniga  HPI: Ricardo Zuniga is a very pleasant 72 YO WM w/ HFrEF 2/2 nonischemic cardiomyopathy, T2DM and hx of acute promyelocytic leukemia s/p ATRA ([facility] 2014) that was initially see at [facility] in October 2023 after presenting with a 1 week history of orthopnea, PND & LE edema. He had a TTE prior to admission w/ LVEF of 40-45%. Echo during admit was notable for LVEF of 30%-35%. LHC/RHC during admit w/ nonobstructive CAD & moderately reduced CI (2.1 L/min/m2). His cardiac history dates back to 2015 when he was found to have an LVEF of 25% felt to be triggered by endocarditis. He had improvement in LV function for several years, however, unfortunately had a fairly rapid decline in LVEF and functional status over the winter months of 2023. The decision was made to proceed with HMIII implantation. Ricardo Zuniga underwent successful implant on 09/24/22 as DT. His post-operative course was fairly unremarkable. He had rapid improvement in PA pressures after HMIII implantation.   Today Ricardo Zuniga presents for his second outpatient visit since [place] implant. From a functional standpoint he is doing remarkably well. He is able to walk longer distances and is now able to walk upstairs at his home without significant difficulty. He is very motivated to remain active and spend time with his grandchildren. No reported lightheadedness, LE edema, chest pain or SOB today.   Past Medical History:  Diagnosis Date   Anal fissure    APML (acute promyelocytic leukemia) in remission ([place])    Completed treatment 04/2013   CHF (congestive heart failure) (HCC)    Coronary artery disease    Nonobstructive at cardiac catheterization 09/2018   Difficult intubation    w/shoulder surgery at surgical center between 2005-2008    Gastroesophageal reflux disease    History of blood transfusion 2014   History of kidney stones    passed stones   Hypercholesteremia    Neuropathy    bilateral feet   Nonischemic cardiomyopathy ([place])    a. EF 20% in 2015 - thought to possibly be viral induced or chemotherapy induced from treatments in 2014 b. at 35-40% by echo in 05/2016    Peptic ulcer disease    Pneumonia    Yrs ago   Type 2 diabetes mellitus ([place])    Ureteral colic    Vertigo    tx with valium prn   Wears glasses    Wears hearing aid in both ears     Current Outpatient Medications  Medication Sig Dispense Refill   acetaminophen (TYLENOL) 650 MG CR tablet Take 650-1,300 mg by mouth every 8 (eight) hours as needed for pain.     amiodarone (PACERONE) 200 MG tablet Take 1 tablet (200 mg total) by mouth daily. 30 tablet 5   atorvastatin (LIPITOR) 40 MG tablet Take 1 tablet by mouth once daily 90 tablet 1   Cholecalciferol (VITAMIN D3) 125 MCG (5000 UT) TABS Take 5,000 Units by mouth in the morning.     Digoxin 62.5 MCG TABS Take 1 tablet by mouth daily. 30 tablet 5   docusate sodium (COLACE) 100 MG capsule Take 200 mg by mouth every evening.     empagliflozin (JARDIANCE) 10 MG TABS tablet Take 10 mg by mouth every evening.     furosemide (LASIX) 20 MG tablet Take 1 tablet (20 mg total)  by mouth daily. 90 tablet 3   Multiple Vitamins-Minerals (CERTAVITE/ANTIOXIDANTS) TABS Take 1 tablet by mouth daily. 90 tablet 3   omeprazole (PRILOSEC) 20 MG capsule Take 20 mg by mouth daily before breakfast.     potassium chloride SA (KLOR-CON M) 20 MEQ tablet Take 1 tablet (20 mEq total) by mouth daily. 60 tablet 3   repaglinide (PRANDIN) 2 MG tablet Take 2-4 mg by mouth See admin instructions. Take 2 tablets (4 mg) by mouth in the morning & take 1 tablet (2 mg) by mouth in the evening.     spironolactone (ALDACTONE) 25 MG tablet Take 1 tablet (25 mg total) by mouth daily. 45 tablet 3   traZODone (DESYREL) 100 MG tablet Take 1  tablet (100 mg total) by mouth at bedtime as needed for sleep. 30 tablet 3   warfarin (COUMADIN) 5 MG tablet Take 1 tablet (5 mg) by mouth daily except half-tablet (2.5 mg) by mouth on Tuesday and Thursday 30 tablet 11   albuterol (VENTOLIN HFA) 108 (90 Base) MCG/ACT inhaler Inhale 2 puffs into the lungs every 6 (six) hours as needed for wheezing or shortness of breath. (Patient not taking: Reported on 10/14/2022) 8 g 2   diazepam (VALIUM) 5 MG tablet Take 5 mg by mouth daily as needed (vertigo). (Patient not taking: Reported on 10/14/2022)     guaiFENesin (MUCINEX) 600 MG 12 hr tablet Take 600 mg by mouth daily as needed (congestion.). (Patient not taking: Reported on 10/14/2022)     Multiple Vitamin (MULTIVITAMIN WITH MINERALS) TABS tablet Take 1 tablet by mouth daily. (Patient not taking: Reported on 10/21/2022) 30 tablet 5   ondansetron (ZOFRAN) 4 MG tablet Take 1 tablet (4 mg total) by mouth every 6 (six) hours as needed for nausea. (Patient not taking: Reported on 10/14/2022) 20 tablet 0   zinc gluconate 50 MG tablet Take 50 mg by mouth in the morning. (Patient not taking: Reported on 10/14/2022)     No current facility-administered medications for this encounter.    Allergies  Allergen Reactions   Zestril [Lisinopril] Cough      Social History   Socioeconomic History   Marital status: Married    Spouse name: Not on file   Number of children: Not on file   Years of education: Not on file   Highest education level: Not on file  Occupational History   Occupation: RETIRED    Comment: [employer]   Occupation: SECURITY GUARD  Tobacco Use   Smoking status: Former    Packs/day: 2.00    Years: 17.00    Additional pack years: 0.00    Total pack years: 34.00    Types: Cigarettes    Start date: 02/17/1968    Quit date: 02/28/1986    Years since quitting: 36.6   Smokeless tobacco: Never  Vaping Use   Vaping Use: Never used  Substance and Sexual Activity   Alcohol use: No     Alcohol/week: 0.0 standard drinks of alcohol   Drug use: No   Sexual activity: Not Currently  Other Topics Concern   Not on file  Social History Narrative   Not on file   Social Determinants of Health   Financial Resource Strain: Low Risk  (09/03/2022)   Overall Financial Resource Strain (CARDIA)    Difficulty of Paying Living Expenses: Not hard at all  Food Insecurity: No Food Insecurity (09/23/2022)   Hunger Vital Sign    Worried About Running Out of Food in the  Last Year: Never true    Panacea in the Last Year: Never true  Transportation Needs: No Transportation Needs (09/23/2022)   PRAPARE - Hydrologist (Medical): No    Lack of Transportation (Non-Medical): No  Physical Activity: Not on file  Stress: Not on file  Social Connections: Not on file  Intimate Partner Violence: Not At Risk (09/23/2022)   Humiliation, Afraid, Rape, and Kick questionnaire    Fear of Current or Ex-Partner: No    Emotionally Abused: No    Physically Abused: No    Sexually Abused: No      Family History  Problem Relation Age of Onset   Colon cancer Other     PHYSICAL EXAM: Vital Signs:  Doppler Pressure 112  Automatc BP: 120/72 (89) HR: 87  SPO2: 98%   Weight: 173.8 lb w/o eqt Last weight: 177.2 lb Home weights: 163 lbs this morning  VAD interrogation & Equipment Management: Speed: 5500 Flow: 4.0 Power: 4.2 w    PI: 4.5  General: NAD, sitting comfortably.  Neck: JVP <7cm Lungs: Clear to auscultation bilaterally with normal respiratory effort. CV: Normal LVAD hum, intermittently pulsatile with weak radial pulses..  Abdomen: Soft, nontender, no hepatosplenomegaly, driveline w/o erythema or induration. Skin: Intact without lesions or rashes.  Neurologic: Alert and oriented x 3.  Psych: Normal affect. Extremities: No clubbing or cyanosis. Warm to touch.  HEENT: Normal.   ECHO: - Echo (06/08/22): EF 30-35%, global HK, RV mildly reduced, mild MR    - Echo (7/23): EF 45%, RV normal   - Echo (8/22): EF 40-45%, RV normal   - Echo (7/21): EF 50%, RV normal  - LHC (09/2018): Moderate nonobstructive one-vessel coronary artery disease, prox RCA 40% stenosed.   - Echo (08/2018): EF 40%, RV normal  - Echo (05/2016): EF 35-40%, RV normal   - Echo (05/2014): EF 25-30%, RV mildly reduced  - Echo (05/2012): EF 50%, RV normal  - Echo (1/24): EF 25%, severe LV dilation, global hypokinesis, moderate RV dysfunction, mild RV enlargement, moderate AI, mild-moderate MR.  CATH: R/LHC (10/23): nonobstructive CAD RA 8 PA 52/25 (34 mean) PCWP 14 (mean) CO/CI (Fick): 4.3/2.1                                 PVR 4.6 WU          PAPi 3.4             RHC (1/24): RA mean 10 PA 69/28, mean 43 PCWP mean 20 CI 1.85 PVR 6.22 WU PAPi 4.1     ASSESSMENT & PLAN:  1. Chronic systolic heart failure s/p HMIII Destination therapy - Significant improvement in functional status since HMIII placement.  - Euvolemic on exam today with minimal PI events; no low flows or alarms.  - INR/BMP today  - Stop ASA 81mg  - Continue digoxin 62.31mcg for RV support. Plan for echo with ramp study at next visit.  - Continue jardiance 10mg , lasix 20mg .  - Continue spironolactone 25mg  daily.  - Continue amiodarone 200mg , will plan to D/C soon if Ziopatch is unremarkable.  - MAPs at goal.   2. Hx of atrial tach, PVCs & NSVT - Continue amiodarone 200mg  BID - I suspect since HMIII implantation his burden of ectopy has improved significantly; pending Ziopatch results will D/C amiodarone.   3. Hx of promyelocytic leukemia: S/P [facility]TRA in 2014 at [facility].  4. Hx of endocarditis: 2015  I reviewed the LVAD parameters from today and compared the results to the patient's prior recorded data. LVAD interrogation was NEGATIVE for significant power changes, NEGATIVE for clinical alarms and STABLE for PI events/speed drops. No programming changes were made and pump is functioning within  specified parameters. Pt is performing daily controller and system monitor self tests along with completing weekly and monthly maintenance for LVAD equipment.   Shylee Durrett 10/27/2022 10:43 AM

## 2022-10-28 NOTE — Progress Notes (Unsigned)
Cardiology Office Note  Date: 10/29/2022   ID: Ricardo Zuniga, Ricardo Zuniga May 27, 1951, MRN [id number]  History of Present Illness: AADHAV Zuniga is a 72 y.o. male last seen in January. Recent office visit with [name] in the heart failure clinic reviewed. He is status post Heartmate 3 implantation as destination therapy in February. He has been maintained on Amiodarone due to atrial tachycardia and NSVT so far. Outpatient monitor pending.  He is here with his wife today.  States that he feels much better overall since LVAD.  NYHA class II symptoms overall.  No obvious fluid overload, reporting stable weights at home around 165 without equipment or clothing.  I reviewed his medications.  Continues on Jardiance and Aldactone, Lasix was already cut back to 20 mg daily with potassium supplement, I suspect this will go to as needed next.  Physical Exam: VS: Blood pressure 123XX123 systolic with brachial Doppler Pulse (!) 54   Ht 5' 9.5" (1.765 m)   Wt 174 lb 12.8 oz (79.3 kg)   SpO2 99%   BMI 25.44 kg/m , BMI Body mass index is 25.44 kg/m.  Wt Readings from Last 3 Encounters:  10/29/22 174 lb 12.8 oz (79.3 kg)  10/21/22 173 lb 12.8 oz (78.8 kg)  10/14/22 177 lb 3.2 oz (80.4 kg)    General: Patient appears comfortable at rest. HEENT: Conjunctiva and lids normal. Neck: Supple, no elevated JVP. Lungs: Clear to auscultation, nonlabored breathing at rest. Cardiac: LVAD hum.  Faint intermittent brachial pulse Abdomen: Bowel sounds present. Extremities: No pitting edema.  Skin warm and dry.  ECG:  An ECG dated 09/25/2021 was personally reviewed today and demonstrated:  Ventricular depolarizations at 83 bpm with electrical artifact due to VAD.  Labwork: 09/01/2022: TSH 2.329 09/28/2022: ALT 12; AST 28 09/30/2022: B Natriuretic Peptide 969.5 10/07/2022: Magnesium 2.2 10/21/2022: BUN 19; Creatinine, Ser 0.85; Hemoglobin 11.6; Platelets 309; Potassium 4.5; Sodium 136     Component Value  Date/Time   CHOL 134 09/02/2022 0547   TRIG 75 09/02/2022 0547   HDL 34 (L) 09/02/2022 0547   CHOLHDL 3.9 09/02/2022 0547   VLDL 15 09/02/2022 0547   LDLCALC 85 09/02/2022 0547   Other Studies Reviewed Today:  Echocardiogram 10/03/2021:  1. A HeartMate III LVAD is in place           Baseline Speed: 5400RPM      LVIDd: 5.6cm      Septum: Midline      AoV: Opens intermittently      RV: Moderately enlarged with moderately reduced RV systolic function      MV: Trivial MR           Speed: 5500rpm      LVIDd: 5.5cm      Septum: Midline      AoV: Opens (partially) intermittently, no AR      RV: Moderately enlarged with moderately reduced RV systolic function      MV: Trivial MR. Left ventricular ejection fraction, by estimation, is  <20%. The left ventricle has severely decreased function. The left  ventricle demonstrates global hypokinesis.   2. Right ventricular systolic function is moderately reduced. The right  ventricular size is moderately enlarged. There appears to be fibrinous  material along the RV free wall that is more prevalent on current study  when compared to prior. There is no  evidence of RV collapse.   3. Left atrial size was moderately dilated.   4. Right atrial size was moderately  dilated.   5. The mitral valve is grossly normal. Trivial mitral valve  regurgitation.   6. The aortic valve is tricuspid. There is mild calcification of the  aortic valve. There is mild thickening of the aortic valve. Aortic valve  regurgitation is not visualized. Aortic valve sclerosis/calcification is  present, without any evidence of  aortic stenosis.   Assessment and Plan:  1.  HFrEF with nonischemic cardiomyopathy, LVEF 20-25% now status post Heartmate 3 implantation as destination therapy in February. He is on Coumadin along with Jardiance, digoxin, aldactone, and Lasix with potassium supplement.  Plan is for him to keep routine follow-up in the heart failure clinic, he would  still like to come here intermittently for follow-up as well, but I will obviously let the heart failure team guide his management and medical therapy adjustments.  I did not change anything today although suspect that his Lasix and potassium supplement could probably go to as needed based on weight and edema.  2. Atrial tachycardia and NSVT, continues on Amiodarone with follow-up monitor pending for reassessment given improvement in hemodynamics.  Hopefully, this will be able to be discontinued once his monitor results have returned.  3. Nonobstructive CAD.  No active angina.  He remains on Lipitor.  Disposition:  Follow up  6 months.  Signed, Satira Sark, M.D., F.A.C.C.

## 2022-10-29 ENCOUNTER — Ambulatory Visit: Payer: Medicare PPO | Attending: Cardiology | Admitting: Cardiology

## 2022-10-29 ENCOUNTER — Encounter: Payer: Self-pay | Admitting: Cardiology

## 2022-10-29 VITALS — HR 54 | Ht 69.5 in | Wt 174.8 lb

## 2022-10-29 DIAGNOSIS — I5020 Unspecified systolic (congestive) heart failure: Secondary | ICD-10-CM | POA: Diagnosis not present

## 2022-10-29 NOTE — Patient Instructions (Addendum)
Medication Instructions:  Your physician recommends that you continue on your current medications as directed. Please refer to the Current Medication list given to you today.  Labwork: none  Testing/Procedures: none  Follow-Up: Your physician recommends that you schedule a follow-up appointment in: 6 month  Any Other Special Instructions Will Be Listed Below (If Applicable).  If you need a refill on your cardiac medications before your next appointment, please call your pharmacy. 

## 2022-10-30 DIAGNOSIS — Z95811 Presence of heart assist device: Secondary | ICD-10-CM | POA: Diagnosis not present

## 2022-10-30 DIAGNOSIS — I5023 Acute on chronic systolic (congestive) heart failure: Secondary | ICD-10-CM | POA: Diagnosis not present

## 2022-10-30 DIAGNOSIS — D62 Acute posthemorrhagic anemia: Secondary | ICD-10-CM | POA: Diagnosis not present

## 2022-10-30 DIAGNOSIS — Z7984 Long term (current) use of oral hypoglycemic drugs: Secondary | ICD-10-CM | POA: Diagnosis not present

## 2022-10-30 DIAGNOSIS — I493 Ventricular premature depolarization: Secondary | ICD-10-CM | POA: Diagnosis not present

## 2022-10-30 DIAGNOSIS — I471 Supraventricular tachycardia, unspecified: Secondary | ICD-10-CM | POA: Diagnosis not present

## 2022-10-30 DIAGNOSIS — Z7901 Long term (current) use of anticoagulants: Secondary | ICD-10-CM | POA: Diagnosis not present

## 2022-10-30 DIAGNOSIS — I428 Other cardiomyopathies: Secondary | ICD-10-CM | POA: Diagnosis not present

## 2022-10-30 DIAGNOSIS — E1140 Type 2 diabetes mellitus with diabetic neuropathy, unspecified: Secondary | ICD-10-CM | POA: Diagnosis not present

## 2022-10-31 ENCOUNTER — Other Ambulatory Visit (HOSPITAL_COMMUNITY): Payer: Self-pay

## 2022-10-31 IMAGING — MR MR LUMBAR SPINE WO/W CM
4 of 7 series · 23 of 48 positions shown · IV contrast (Gadavist)
Comparison: None Available.

CLINICAL DATA: Low back pain, cauda equina syndrome suspected

EXAM:
MRI LUMBAR SPINE WITHOUT AND WITH CONTRAST
TECHNIQUE: Multiplanar and multiecho pulse sequences of the lumbar spine were
obtained without and with intravenous contrast.
CONTRAST:  8.5mL GADAVIST GADOBUTROL 1 MMOL/ML IV SOLN

[Series 5: T2 · sagittal · 4.0mm · 0.73mm/px · 4 of 17 slices shown (1 of 2)]
[im 1/17]
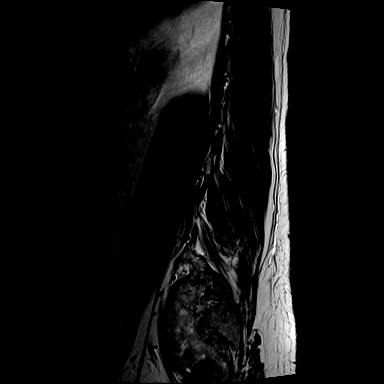
[im 6/17]
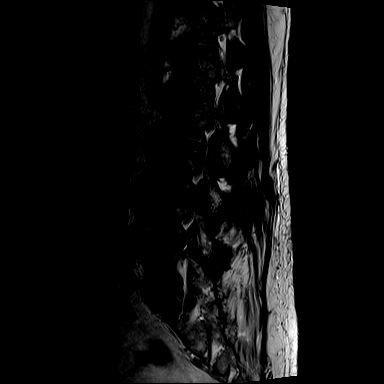
[im 11/17]
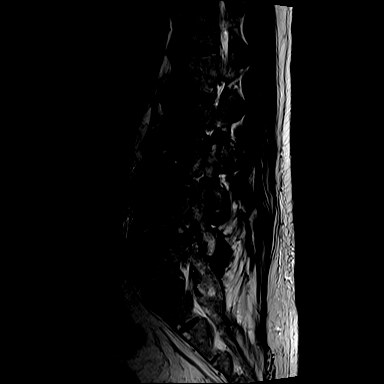
[im 17/17]
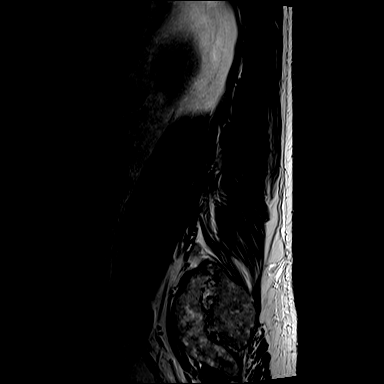

[Series 7: T1 · sagittal · 4.0mm · 0.88mm/px · 5 of 17 slices shown (1 of 2)]
[im 1/17]
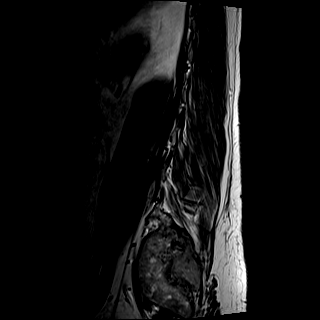
[im 5/17]
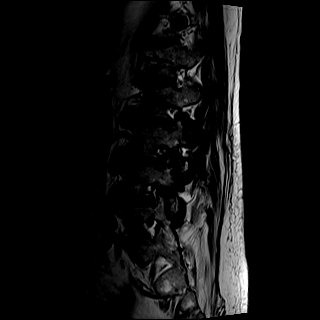
[im 9/17]
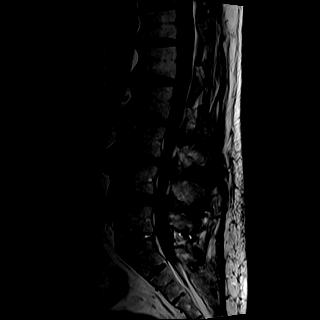
[im 13/17]
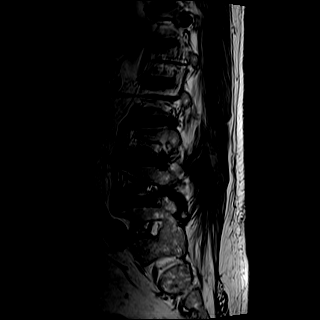
[im 17/17]
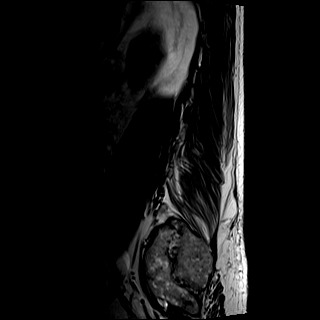

[Series 8: T2 · axial · 4.0mm · 0.57mm/px · z∈[-114,+83]mm · 8 of 37 slices shown (2 of 2)]
[im 1/37]
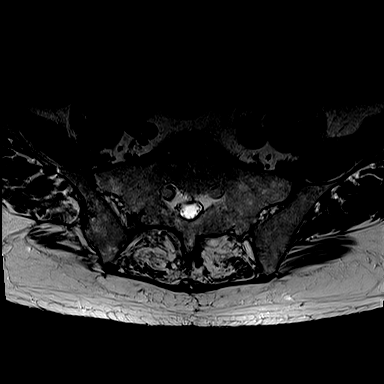
[im 5/37]
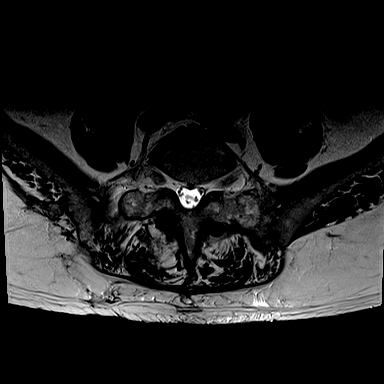
[im 13/37]
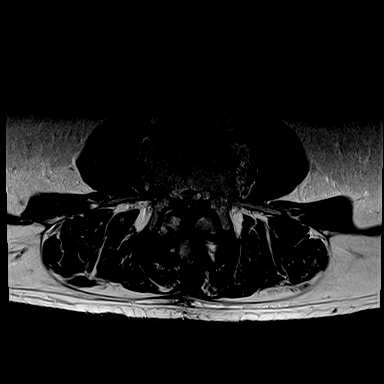
[im 17/37]
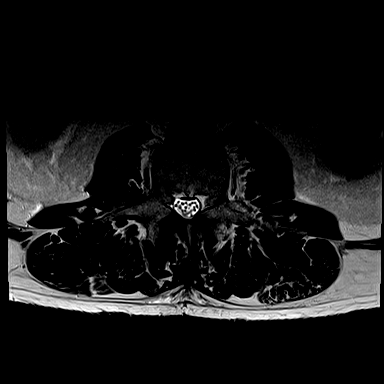
[im 21/37]
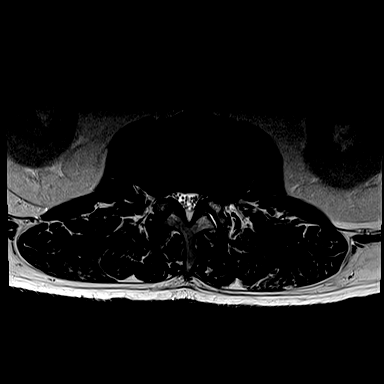
[im 25/37]
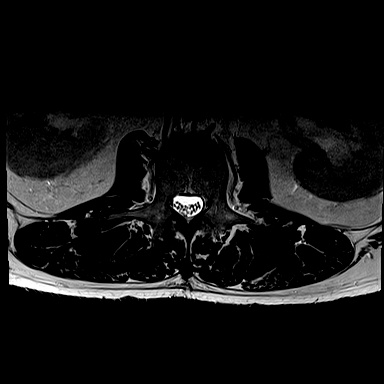
[im 33/37]
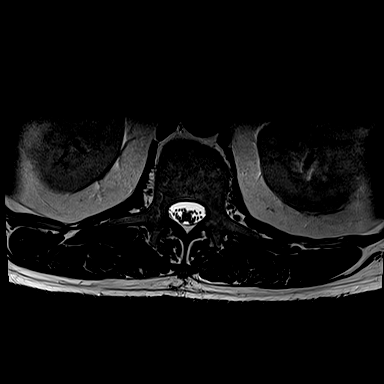
[im 37/37]
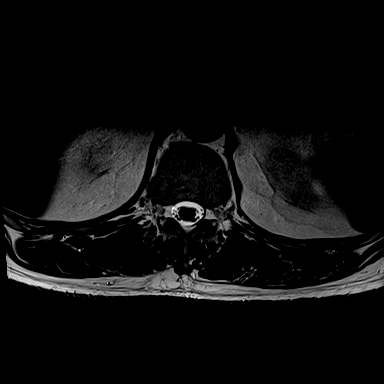

[Series 9: T1 · axial · 4.0mm · 0.34mm/px · z∈[-114,+63]mm · 6 of 37 slices shown (2 of 2)]
[im 1/37]
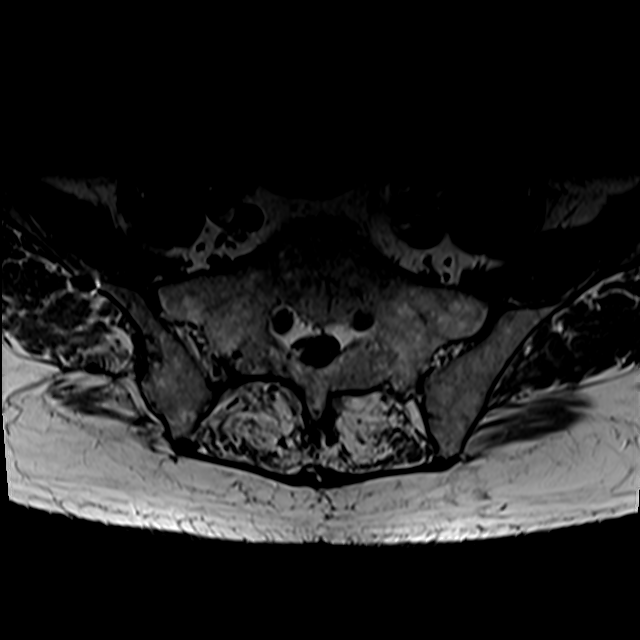
[im 5/37]
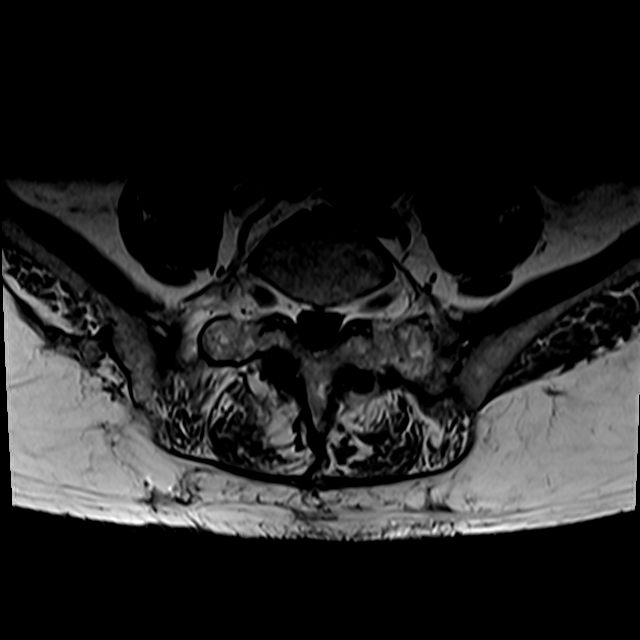
[im 13/37]
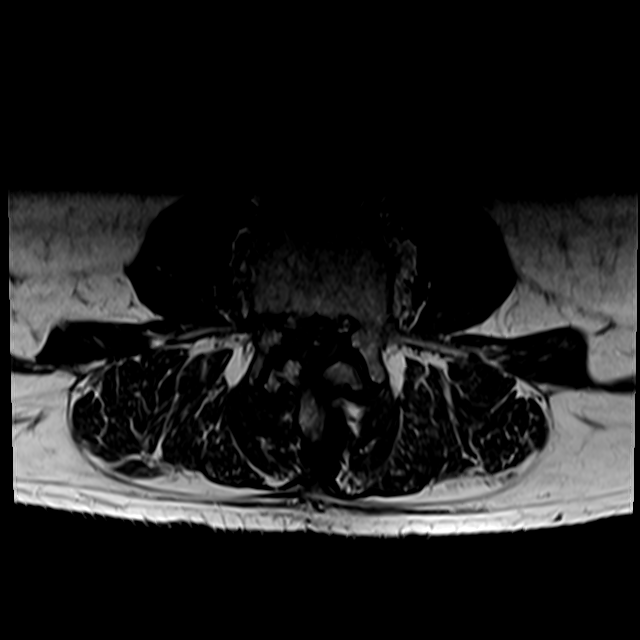
[im 17/37]
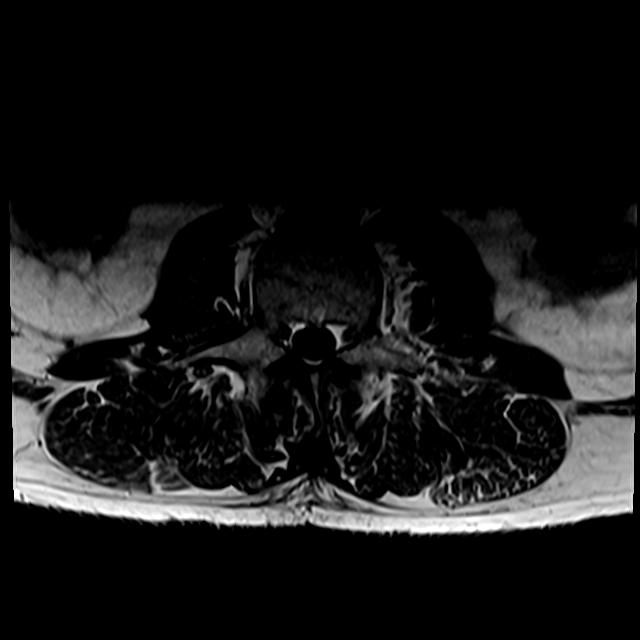
[im 21/37]
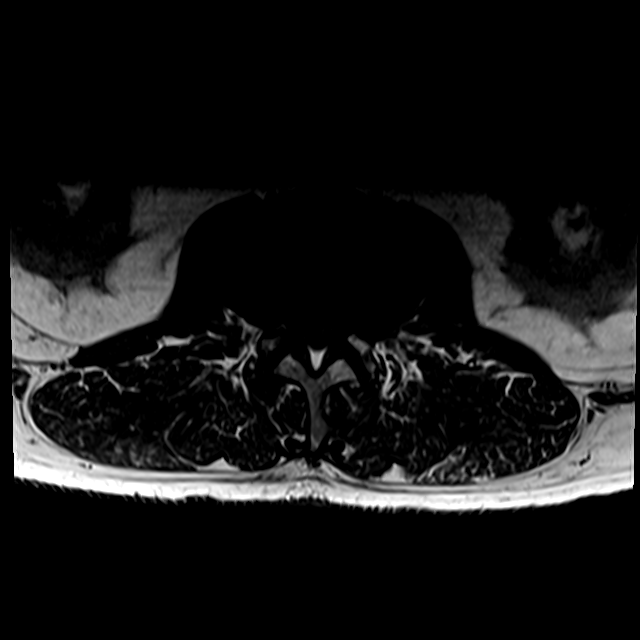
[im 33/37]
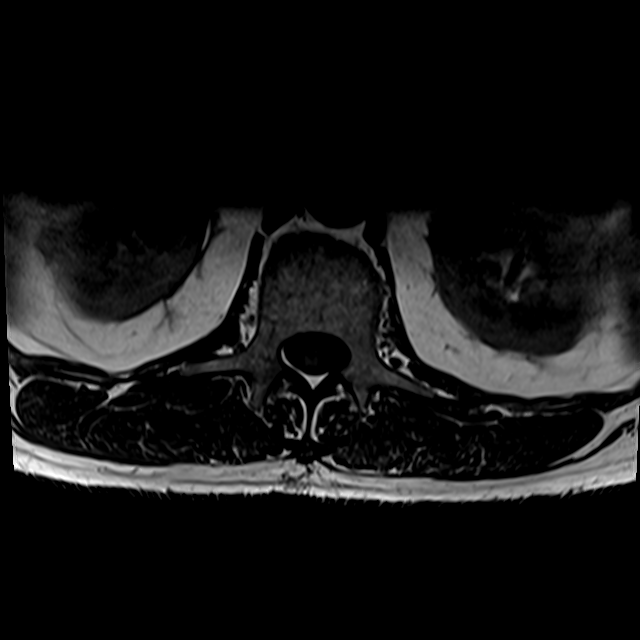

[23 of 48 positions shown; findings below may reference images not displayed]

FINDINGS: Segmentation: 4 lumbar-type vertebral bodies with sacralization of
L5 and a rudimentary disc at L5-S1.

Alignment: S shaped curvature of the thoracolumbar spine no
significant listhesis.

Vertebrae: Fracture or suspicious osseous lesion. No abnormal
enhancement. Congenitally short pedicles, which narrow the AP
diameter of the spinal canal.

Conus medullaris and cauda equina: Conus extends to the L1 level.
Conus appears normal. Mild thickening of the cauda equina nerve
roots between L3-L4 and L4-L5 (series 8, image 27) and just distal
to L4-L5 (series 8, image 32). No abnormal enhancement.

Paraspinal and other soft tissues: Left renal cyst. Atrophy of the
inferior paraspinous muscles.

Disc levels:

T12-L1: No significant disc bulge. No spinal canal stenosis or
neural foraminal narrowing.

L1-L2: Minimal disc bulge. No spinal canal stenosis or neural
foraminal narrowing.

L2-L3: Minimal disc bulge with superimposed left foraminal
protrusion. Ligamentum flavum hypertrophy. Narrowing of the
left-greater-than-right lateral recess. Mild-to-moderate spinal
canal stenosis. Mild facet arthropathy. No neural foraminal
narrowing.

L3-L4: Moderate disc bulge superimposed on short pedicles. Mild
facet arthropathy. Ligamentum flavum hypertrophy. Severe spinal
canal stenosis. Effacement of the lateral recesses. Mild-to-moderate
left and mild right neural foraminal narrowing.

L4-L5: Moderate disc bulge superimposed on short pedicles. Mild
facet arthropathy. Ligamentum flavum hypertrophy. Severe spinal
canal stenosis. Effacement of the lateral recesses. Moderate to
severe left and mild right neural foraminal narrowing.

L5-S1: No disc bulge. No spinal canal stenosis or neural foraminal
narrowing.
IMPRESSION: 1. Severe spinal canal stenosis at L3-L4, as can be seen with cauda
equina syndrome, with mild thickening of the cauda equina nerve
roots between L3-L4 and L4-L5 and just distal to L4-L5, concerning
for edema. No abnormal signal or enhancement.
2. L4-L5 moderate to severe left and mild right neural foraminal
narrowing. Effacement of the lateral recesses at this level likely
compresses the descending L5 nerve roots.
3. L3-L4 mild-to-moderate left and mild right neural foraminal
narrowing. Effacement of the lateral recesses at this level likely
compresses the descending L4 nerve roots.
4. L2-L3 mild-to-moderate spinal canal stenosis. Narrowing of the
left-greater-than-right lateral recess at this level could the
descending L3 nerve roots.
5. Transitional anatomy, with 4 lumbar-type vertebral bodies and
sacralization of L5. Please correlate with imaging if any
intervention is planned.

These results were called by telephone at the time of interpretation
on 12/29/2021 at [DATE] to provider SEPTIANA MAMBA , who
verbally acknowledged these results.

## 2022-11-03 ENCOUNTER — Other Ambulatory Visit (HOSPITAL_COMMUNITY): Payer: Self-pay | Admitting: *Deleted

## 2022-11-03 DIAGNOSIS — Z7901 Long term (current) use of anticoagulants: Secondary | ICD-10-CM | POA: Diagnosis not present

## 2022-11-03 DIAGNOSIS — I5023 Acute on chronic systolic (congestive) heart failure: Secondary | ICD-10-CM | POA: Diagnosis not present

## 2022-11-03 DIAGNOSIS — E1140 Type 2 diabetes mellitus with diabetic neuropathy, unspecified: Secondary | ICD-10-CM | POA: Diagnosis not present

## 2022-11-03 DIAGNOSIS — Z95811 Presence of heart assist device: Secondary | ICD-10-CM | POA: Diagnosis not present

## 2022-11-03 DIAGNOSIS — Z7984 Long term (current) use of oral hypoglycemic drugs: Secondary | ICD-10-CM | POA: Diagnosis not present

## 2022-11-03 DIAGNOSIS — I428 Other cardiomyopathies: Secondary | ICD-10-CM | POA: Diagnosis not present

## 2022-11-03 DIAGNOSIS — I493 Ventricular premature depolarization: Secondary | ICD-10-CM | POA: Diagnosis not present

## 2022-11-03 DIAGNOSIS — I471 Supraventricular tachycardia, unspecified: Secondary | ICD-10-CM | POA: Diagnosis not present

## 2022-11-03 DIAGNOSIS — D62 Acute posthemorrhagic anemia: Secondary | ICD-10-CM | POA: Diagnosis not present

## 2022-11-04 ENCOUNTER — Encounter (HOSPITAL_COMMUNITY)
Admission: RE | Admit: 2022-11-04 | Discharge: 2022-11-04 | Disposition: A | Discharge status: Home / Self Care | Payer: Medicare PPO | Source: Ambulatory Visit | Attending: Cardiology | Admitting: Cardiology

## 2022-11-04 VITALS — HR 82 | Ht 69.5 in | Wt 173.9 lb

## 2022-11-04 DIAGNOSIS — Z95811 Presence of heart assist device: Secondary | ICD-10-CM | POA: Diagnosis not present

## 2022-11-04 DIAGNOSIS — I5022 Chronic systolic (congestive) heart failure: Secondary | ICD-10-CM | POA: Insufficient documentation

## 2022-11-04 DIAGNOSIS — Z48812 Encounter for surgical aftercare following surgery on the circulatory system: Secondary | ICD-10-CM | POA: Diagnosis not present

## 2022-11-04 DIAGNOSIS — Z7901 Long term (current) use of anticoagulants: Secondary | ICD-10-CM | POA: Diagnosis not present

## 2022-11-04 DIAGNOSIS — I5023 Acute on chronic systolic (congestive) heart failure: Secondary | ICD-10-CM | POA: Insufficient documentation

## 2022-11-04 LAB — GLUCOSE, CAPILLARY: Glucose-Capillary: 145 mg/dL — ABNORMAL HIGH (ref 70–99)

## 2022-11-04 NOTE — Progress Notes (Signed)
Cardiac Individual Treatment Plan  Patient Details  Name: Ricardo Zuniga MRN: [id number] Date of Birth: 15-Feb-1951 Referring Provider:   Flowsheet Row CARDIAC REHAB PHASE II ORIENTATION from 11/04/2022 in [place]  Referring Provider Dr. Tenny Craw       Initial Encounter Date:  Flowsheet Row CARDIAC REHAB PHASE II ORIENTATION from 11/04/2022 in [place]  Date 11/04/22       Visit Diagnosis: Acute on chronic systolic (congestive) heart failure ([place])  LVAD (left ventricular assist device) present ([place])  Patient's Home Medications on Admission:  Current Outpatient Medications:    acetaminophen (TYLENOL) 650 MG CR tablet, Take 650-1,300 mg by mouth every 8 (eight) hours as needed for pain., Disp: , Rfl:    amiodarone (PACERONE) 200 MG tablet, Take 1 tablet (200 mg total) by mouth daily., Disp: 30 tablet, Rfl: 5   atorvastatin (LIPITOR) 40 MG tablet, Take 1 tablet by mouth once daily, Disp: 90 tablet, Rfl: 1   Cholecalciferol (VITAMIN D3) 125 MCG (5000 UT) TABS, Take 5,000 Units by mouth in the morning., Disp: , Rfl:    Digoxin 62.5 MCG TABS, Take 1 tablet by mouth daily., Disp: 30 tablet, Rfl: 5   docusate sodium (COLACE) 100 MG capsule, Take 200 mg by mouth every evening., Disp: , Rfl:    empagliflozin (JARDIANCE) 10 MG TABS tablet, Take 10 mg by mouth every evening., Disp: , Rfl:    furosemide (LASIX) 20 MG tablet, Take 1 tablet (20 mg total) by mouth daily., Disp: 90 tablet, Rfl: 3   Multiple Vitamins-Minerals (CERTAVITE/ANTIOXIDANTS) TABS, Take 1 tablet by mouth daily., Disp: 90 tablet, Rfl: 3   omeprazole (PRILOSEC) 20 MG capsule, Take 20 mg by mouth daily before breakfast., Disp: , Rfl:    potassium chloride SA (KLOR-CON M) 20 MEQ tablet, Take 1 tablet (20 mEq total) by mouth daily., Disp: 60 tablet, Rfl: 3   repaglinide (PRANDIN) 2 MG tablet, Take 2-4 mg by mouth See admin instructions. Take 2 tablets (4 mg) by mouth in the morning  & take 1 tablet (2 mg) by mouth in the evening., Disp: , Rfl:    spironolactone (ALDACTONE) 25 MG tablet, Take 1 tablet (25 mg total) by mouth daily., Disp: 45 tablet, Rfl: 3   traZODone (DESYREL) 100 MG tablet, Take 1 tablet (100 mg total) by mouth at bedtime as needed for sleep., Disp: 30 tablet, Rfl: 3   warfarin (COUMADIN) 5 MG tablet, Take 1 tablet (5 mg) by mouth daily except half-tablet (2.5 mg) by mouth on Tuesday and Thursday, Disp: 30 tablet, Rfl: 11  Past Medical History: Past Medical History:  Diagnosis Date   Anal fissure    APML (acute promyelocytic leukemia) in remission ([place])    Completed treatment 04/2013   CHF (congestive heart failure) ([place])    Coronary artery disease    Nonobstructive at cardiac catheterization 09/2018   Difficult intubation    w/shoulder surgery at surgical center between 2005-2008   Gastroesophageal reflux disease    History of blood transfusion 2014   History of kidney stones    passed stones   Hypercholesteremia    Neuropathy    bilateral feet   Nonischemic cardiomyopathy ([place])    a. EF 20% in 2015 - thought to possibly be viral induced or chemotherapy induced from treatments in 2014 b. at 35-40% by echo in 05/2016    Peptic ulcer disease    Pneumonia    Yrs ago   Type 2 diabetes mellitus (  [place])    Ureteral colic    Vertigo    tx with valium prn   Wears glasses    Wears hearing aid in both ears     Tobacco Use: Social History   Tobacco Use  Smoking Status Former   Packs/day: 2.00   Years: 17.00   Additional pack years: 0.00   Total pack years: 34.00   Types: Cigarettes   Start date: 02/17/1968   Quit date: 02/28/1986   Years since quitting: 36.7   Passive exposure: Never  Smokeless Tobacco Never    Labs: Review Flowsheet  More data exists      Latest Ref Rng & Units 09/29/2022 09/30/2022 10/01/2022 10/02/2022 10/03/2022  Labs for ITP Cardiac and Pulmonary Rehab  O2 Saturation % 78.8  82.4  74.3  79.5  68.1  56  58      Capillary Blood Glucose: Lab Results  Component Value Date   GLUCAP 145 (H) 11/04/2022   GLUCAP 151 (H) 10/07/2022   GLUCAP 168 (H) 10/06/2022   GLUCAP 142 (H) 10/06/2022   GLUCAP 226 (H) 10/06/2022    POCT Glucose     Row Name 11/04/22 0944             POCT Blood Glucose   Pre-Exercise 145 mg/dL                Exercise Target Goals: Exercise Program Goal: Individual exercise prescription set using results from initial 6 min walk test and THRR while considering  patient's activity barriers and safety.   Exercise Prescription Goal: Starting with aerobic activity 30 plus minutes a day, 3 days per week for initial exercise prescription. Provide home exercise prescription and guidelines that participant acknowledges understanding prior to discharge.  Activity Barriers & Risk Stratification:  Activity Barriers & Cardiac Risk Stratification - 11/04/22 0905       Activity Barriers & Cardiac Risk Stratification   Activity Barriers Arthritis;Joint Problems;Decreased Ventricular Function    Cardiac Risk Stratification High             6 Minute Walk:  6 Minute Walk     Row Name 11/04/22 1009         6 Minute Walk   Phase Initial     Distance 1100 feet     Walk Time 6 minutes     # of Rest Breaks 1     MPH 2.08     METS 2.39     RPE 12     VO2 Peak 8.36     Symptoms Yes (comment)     Comments one sitting break 1 minute due to fatigue     Resting HR 82 bpm     Resting BP --  78 MAP     Resting Oxygen Saturation  96 %     Exercise Oxygen Saturation  during 6 min walk 99 %     Max Ex. HR 111 bpm     Max Ex. BP --  90 MAP     2 Minute Post BP --  80 MAP              Oxygen Initial Assessment:   Oxygen Re-Evaluation:   Oxygen Discharge (Final Oxygen Re-Evaluation):   Initial Exercise Prescription:  Initial Exercise Prescription - 11/04/22 1000       Date of Initial Exercise RX and Referring Provider   Date 11/04/22    Referring  Provider Dr. Tenny Craw    Expected Discharge Date 01/30/23  NuStep   Level 1    SPM 65    Minutes 39      Prescription Details   Frequency (times per week) 3    Duration Progress to 30 minutes of continuous aerobic without signs/symptoms of physical distress      Intensity   THRR 40-80% of Max Heartrate 59-118    Ratings of Perceived Exertion 11-13      Resistance Training   Training Prescription Yes    Weight 2    Reps 10-15             Perform Capillary Blood Glucose checks as needed.  Exercise Prescription Changes:   Exercise Comments:   Exercise Goals and Review:   Exercise Goals     Row Name 11/04/22 1012             Exercise Goals   Increase Physical Activity Yes       Intervention Develop an individualized exercise prescription for aerobic and resistive training based on initial evaluation findings, risk stratification, comorbidities and participant's personal goals.;Provide advice, education, support and counseling about physical activity/exercise needs.       Expected Outcomes Short Term: Attend rehab on a regular basis to increase amount of physical activity.;Long Term: Add in home exercise to make exercise part of routine and to increase amount of physical activity.;Long Term: Exercising regularly at least 3-5 days a week.       Increase Strength and Stamina Yes       Intervention Provide advice, education, support and counseling about physical activity/exercise needs.;Develop an individualized exercise prescription for aerobic and resistive training based on initial evaluation findings, risk stratification, comorbidities and participant's personal goals.       Expected Outcomes Short Term: Increase workloads from initial exercise prescription for resistance, speed, and METs.;Short Term: Perform resistance training exercises routinely during rehab and add in resistance training at home;Long Term: Improve cardiorespiratory fitness, muscular endurance and  strength as measured by increased METs and functional capacity (6MWT)       Able to understand and use rate of perceived exertion (RPE) scale Yes       Intervention Provide education and explanation on how to use RPE scale       Expected Outcomes Short Term: Able to use RPE daily in rehab to express subjective intensity level;Long Term:  Able to use RPE to guide intensity level when exercising independently       Knowledge and understanding of Target Heart Rate Range (THRR) Yes       Intervention Provide education and explanation of THRR including how the numbers were predicted and where they are located for reference       Expected Outcomes Short Term: Able to state/look up THRR;Short Term: Able to use daily as guideline for intensity in rehab;Long Term: Able to use THRR to govern intensity when exercising independently       Able to check pulse independently Yes       Intervention Provide education and demonstration on how to check pulse in carotid and radial arteries.;Review the importance of being able to check your own pulse for safety during independent exercise       Expected Outcomes Short Term: Able to explain why pulse checking is important during independent exercise;Long Term: Able to check pulse independently and accurately       Understanding of Exercise Prescription Yes       Intervention Provide education, explanation, and written materials on patient's individual exercise prescription  Expected Outcomes Short Term: Able to explain program exercise prescription;Long Term: Able to explain home exercise prescription to exercise independently                Exercise Goals Re-Evaluation :    Discharge Exercise Prescription (Final Exercise Prescription Changes):   Nutrition:  Target Goals: Understanding of nutrition guidelines, daily intake of sodium 1500mg , cholesterol 200mg , calories 30% from fat and 7% or less from saturated fats, daily to have 5 or more servings of  fruits and vegetables.  Biometrics:  Pre Biometrics - 11/04/22 1013       Pre Biometrics   Height 5' 9.5" (1.765 m)    Weight 173 lb 15.1 oz (78.9 kg)    Waist Circumference 36 inches    Hip Circumference 40 inches    Waist to Hip Ratio 0.9 %    BMI (Calculated) 25.33    Triceps Skinfold 10 mm    % Body Fat 23.2 %    Grip Strength 13.3 kg    Flexibility 0 in    Single Leg Stand 0 seconds              Nutrition Therapy Plan and Nutrition Goals:  Nutrition Therapy & Goals - 11/04/22 0912       Personal Nutrition Goals   Comments He is currently eating whatever he wants. He states that he was told to by the LVAD time. He lost 30 lbs in the process of getting his LVAD. They want him to gain some more weight. He scored a 118 on his diet.      Intervention Plan   Intervention Nutrition handout(s) given to patient.    Expected Outcomes Short Term Goal: Understand basic principles of dietary content, such as calories, fat, sodium, cholesterol and nutrients.             Nutrition Assessments:  Nutrition Assessments - 11/04/22 0914       MEDFICTS Scores   Pre Score 118            MEDIFICTS Score Key: ?70 Need to make dietary changes  40-70 Heart Healthy Diet ? 40 Therapeutic Level Cholesterol Diet   Picture Your Plate Scores: D34-534 Unhealthy dietary pattern with much room for improvement. 41-50 Dietary pattern unlikely to meet recommendations for good health and room for improvement. 51-60 More healthful dietary pattern, with some room for improvement.  >60 Healthy dietary pattern, although there may be some specific behaviors that could be improved.    Nutrition Goals Re-Evaluation:   Nutrition Goals Discharge (Final Nutrition Goals Re-Evaluation):   Psychosocial: Target Goals: Acknowledge presence or absence of significant depression and/or stress, maximize coping skills, provide positive support system. Participant is able to verbalize types and  ability to use techniques and skills needed for reducing stress and depression.  Initial Review & Psychosocial Screening:  Initial Psych Review & Screening - 11/04/22 0911       Initial Review   Current issues with Current Sleep Concerns      Family Dynamics   Good Support System? Yes    Comments His wife and his children are his main support system.      Barriers   Psychosocial barriers to participate in program There are no identifiable barriers or psychosocial needs.      Screening Interventions   Interventions Encouraged to exercise;Provide feedback about the scores to participant    Expected Outcomes Long Term goal: The participant improves quality of Life and PHQ9 Scores as seen  by post scores and/or verbalization of changes;Short Term goal: Identification and review with participant of any Quality of Life or Depression concerns found by scoring the questionnaire.             Quality of Life Scores:  Quality of Life - 11/04/22 1013       Quality of Life   Select Quality of Life      Quality of Life Scores   Health/Function Pre 28.5 %    Socioeconomic Pre 28.21 %    Psych/Spiritual Pre 29.64 %    Family Pre 30 %    GLOBAL Pre 28.91 %            Scores of 19 and below usually indicate a poorer quality of life in these areas.  A difference of  2-3 points is a clinically meaningful difference.  A difference of 2-3 points in the total score of the Quality of Life Index has been associated with significant improvement in overall quality of life, self-image, physical symptoms, and general health in studies assessing change in quality of life.  PHQ-9: Review Flowsheet       11/04/2022 09/03/2022  Depression screen PHQ 2/9  Decreased Interest 0 0  Down, Depressed, Hopeless 0 0  PHQ - 2 Score 0 0  Altered sleeping 0 -  Tired, decreased energy 1 -  Change in appetite 1 -  Feeling bad or failure about yourself  0 -  Trouble concentrating 0 -  Moving slowly or  fidgety/restless 0 -  Suicidal thoughts 0 -  PHQ-9 Score 2 -  Difficult doing work/chores Not difficult at all -   Interpretation of Total Score  Total Score Depression Severity:  1-4 = Minimal depression, 5-9 = Mild depression, 10-14 = Moderate depression, 15-19 = Moderately severe depression, 20-27 = Severe depression   Psychosocial Evaluation and Intervention:  Psychosocial Evaluation - 11/04/22 0950       Psychosocial Evaluation & Interventions   Interventions Relaxation education;Stress management education;Encouraged to exercise with the program and follow exercise prescription    Comments Pt has no barriers to participate in CR. He has no identifiable psychosocial issues. He does take Trazodone for sleep, and he reports that he was prescribed this previous to getting his LVAD. He states that he sleeps great at night. He scored a 2 on his PHQ-9 and this relates to his energy levels and his lack of appetite. His energy levels continue to improve as he recovers from his LVAD. His appetite is beginning to come back. He lost about 30 lbs while in the hospital during his LVAD implantation, but some of this was fluid weight. He states that he has been told by his doctors to try to put on weight. He has a very positive outlook and has adjusted well to his LVAD. He states that he has a good support system with his wife and his children. His goals while in the program are to increase his strength and stamina and to return to his usual yard work. He is eager to start the program and motivated to improve himself.    Expected Outcomes Pt's sleep will continue to be managed with Trazodone, and he will continue to have no other identifialble psychosocial issues.    Continue Psychosocial Services  No Follow up required             Psychosocial Re-Evaluation:   Psychosocial Discharge (Final Psychosocial Re-Evaluation):   Vocational Rehabilitation: Provide vocational rehab assistance to  qualifying  candidates.   Vocational Rehab Evaluation & Intervention:  Vocational Rehab - 11/04/22 0919       Initial Vocational Rehab Evaluation & Intervention   Assessment shows need for Vocational Rehabilitation No      Vocational Rehab Re-Evaulation   Comments retired             Education: Education Goals: Education classes will be provided on a weekly basis, covering required topics. Participant will state understanding/return demonstration of topics presented.  Learning Barriers/Preferences:  Learning Barriers/Preferences - 11/04/22 0919       Learning Barriers/Preferences   Learning Barriers Hearing    Learning Preferences Audio;Pictoral;Written Material             Education Topics: Hypertension, Hypertension Reduction -Define heart disease and high blood pressure. Discus how high blood pressure affects the body and ways to reduce high blood pressure.   Exercise and Your Heart -Discuss why it is important to exercise, the FITT principles of exercise, normal and abnormal responses to exercise, and how to exercise safely.   Angina -Discuss definition of angina, causes of angina, treatment of angina, and how to decrease risk of having angina.   Cardiac Medications -Review what the following cardiac medications are used for, how they affect the body, and side effects that may occur when taking the medications.  Medications include Aspirin, Beta blockers, calcium channel blockers, ACE Inhibitors, angiotensin receptor blockers, diuretics, digoxin, and antihyperlipidemics.   Congestive Heart Failure -Discuss the definition of CHF, how to live with CHF, the signs and symptoms of CHF, and how keep track of weight and sodium intake.   Heart Disease and Intimacy -Discus the effect sexual activity has on the heart, how changes occur during intimacy as we age, and safety during sexual activity.   Smoking Cessation / COPD -Discuss different methods to quit  smoking, the health benefits of quitting smoking, and the definition of COPD.   Nutrition I: Fats -Discuss the types of cholesterol, what cholesterol does to the heart, and how cholesterol levels can be controlled.   Nutrition II: Labels -Discuss the different components of food labels and how to read food label   Heart Parts/Heart Disease and PAD -Discuss the anatomy of the heart, the pathway of blood circulation through the heart, and these are affected by heart disease.   Stress I: Signs and Symptoms -Discuss the causes of stress, how stress may lead to anxiety and depression, and ways to limit stress.   Stress II: Relaxation -Discuss different types of relaxation techniques to limit stress.   Warning Signs of Stroke / TIA -Discuss definition of a stroke, what the signs and symptoms are of a stroke, and how to identify when someone is having stroke.   Knowledge Questionnaire Score:  Knowledge Questionnaire Score - 11/04/22 0920       Knowledge Questionnaire Score   Pre Score 20/24             Core Components/Risk Factors/Patient Goals at Admission:  Personal Goals and Risk Factors at Admission - 11/04/22 0923       Core Components/Risk Factors/Patient Goals on Admission    Weight Management Yes;Weight Gain    Intervention Weight Management: Develop a combined nutrition and exercise program designed to reach desired caloric intake, while maintaining appropriate intake of nutrient and fiber, sodium and fats, and appropriate energy expenditure required for the weight goal.;Weight Management: Provide education and appropriate resources to help participant work on and attain dietary goals.    Expected Outcomes  Weight Gain: Understanding of general recommendations for a high calorie, high protein meal plan that promotes weight gain by distributing calorie intake throughout the day with the consumption for 4-5 meals, snacks, and/or supplements    Diabetes Yes     Intervention Provide education about signs/symptoms and action to take for hypo/hyperglycemia.;Provide education about proper nutrition, including hydration, and aerobic/resistive exercise prescription along with prescribed medications to achieve blood glucose in normal ranges: Fasting glucose 65-99 mg/dL    Expected Outcomes Short Term: Participant verbalizes understanding of the signs/symptoms and immediate care of hyper/hypoglycemia, proper foot care and importance of medication, aerobic/resistive exercise and nutrition plan for blood glucose control.;Long Term: Attainment of HbA1C < 7%.    Personal Goal Other Yes    Personal Goal Increase strength and stamina; return to doing his regular yard work    Intervention Attend CR three days per week and begin a home exercise program    Expected Outcomes Pt will meet stated goals.             Core Components/Risk Factors/Patient Goals Review:    Core Components/Risk Factors/Patient Goals at Discharge (Final Review):    ITP Comments:   Comments: Patient arrived for 1st visit/orientation/education at 0800. Patient was referred to CR by Dr. Tenny Craw due to acute on chronic systolic congestive heart failure (I50.23) and LVAD present (Z95.811). During orientation advised patient on arrival and appointment times what to wear, what to do before, during and after exercise. Reviewed attendance and class policy.  Pt is scheduled to return Cardiac Rehab on 11/09/2021 at 0815. Pt was advised to come to class 15 minutes before class starts.  Discussed RPE/Dpysnea scales. Patient participated in warm up stretches. Patient was able to complete 6 minute walk test.  Telemetry:NSR. Patient was measured for the equipment. Discussed equipment safety with patient. Took patient pre-anthropometric measurements. Patient finished visit at 1000.

## 2022-11-05 ENCOUNTER — Other Ambulatory Visit (HOSPITAL_COMMUNITY): Payer: Self-pay | Admitting: *Deleted

## 2022-11-05 ENCOUNTER — Ambulatory Visit (HOSPITAL_COMMUNITY): Payer: Self-pay | Admitting: Pharmacist

## 2022-11-05 ENCOUNTER — Ambulatory Visit (HOSPITAL_COMMUNITY)
Admission: RE | Admit: 2022-11-05 | Discharge: 2022-11-05 | Disposition: A | Discharge status: Home / Self Care | Payer: Medicare PPO | Source: Ambulatory Visit | Attending: Cardiology | Admitting: Cardiology

## 2022-11-05 ENCOUNTER — Encounter (HOSPITAL_COMMUNITY): Payer: Self-pay | Admitting: Cardiology

## 2022-11-05 VITALS — BP 125/80 | HR 95 | Wt 176.2 lb

## 2022-11-05 DIAGNOSIS — I10 Essential (primary) hypertension: Secondary | ICD-10-CM | POA: Diagnosis not present

## 2022-11-05 DIAGNOSIS — Z79899 Other long term (current) drug therapy: Secondary | ICD-10-CM | POA: Insufficient documentation

## 2022-11-05 DIAGNOSIS — Z95811 Presence of heart assist device: Secondary | ICD-10-CM

## 2022-11-05 DIAGNOSIS — Z7901 Long term (current) use of anticoagulants: Secondary | ICD-10-CM | POA: Diagnosis not present

## 2022-11-05 LAB — CBC
HCT: 41 % (ref 39.0–52.0)
Hemoglobin: 13.6 g/dL (ref 13.0–17.0)
MCH: 29 pg (ref 26.0–34.0)
MCHC: 33.2 g/dL (ref 30.0–36.0)
MCV: 87.4 fL (ref 80.0–100.0)
Platelets: 260 10*3/uL (ref 150–400)
RBC: 4.69 MIL/uL (ref 4.22–5.81)
RDW: 14.9 % (ref 11.5–15.5)
WBC: 7 10*3/uL (ref 4.0–10.5)
nRBC: 0 % (ref 0.0–0.2)

## 2022-11-05 LAB — BASIC METABOLIC PANEL
Anion gap: 13 (ref 5–15)
BUN: 20 mg/dL (ref 8–23)
CO2: 25 mmol/L (ref 22–32)
Calcium: 9.8 mg/dL (ref 8.9–10.3)
Chloride: 97 mmol/L — ABNORMAL LOW (ref 98–111)
Creatinine, Ser: 0.95 mg/dL (ref 0.61–1.24)
GFR, Estimated: >60 mL/min (ref >60)
Glucose, Bld: 197 mg/dL — ABNORMAL HIGH (ref 70–99)
Potassium: 4.5 mmol/L (ref 3.5–5.1)
Sodium: 135 mmol/L (ref 135–145)

## 2022-11-05 LAB — LACTATE DEHYDROGENASE: LDH: 174 U/L (ref 98–192)

## 2022-11-05 LAB — PROTIME-INR
INR: 1.9 — ABNORMAL HIGH (ref 0.8–1.2)
Prothrombin Time: 21.9 seconds — ABNORMAL HIGH (ref 11.4–15.2)

## 2022-11-05 LAB — DIGOXIN LEVEL: Digoxin Level: 0.7 ng/mL — ABNORMAL LOW (ref 0.8–2.0)

## 2022-11-05 MED ORDER — LOSARTAN POTASSIUM 25 MG PO TABS: 12.5000 mg | ORAL_TABLET | Freq: Every day | ORAL | 3 refills | Status: DC

## 2022-11-05 NOTE — Progress Notes (Addendum)
Patient presents for 2 week follow up in [facility] today with his wife Ricardo Zuniga. Reports no problems with VAD equipment or concerns with drive line.  Pt states he has been feeling great. Denies lightheadedness, dizziness, SOB, signs of bleeding or swelling.  Reports he has increased his physical activity and is able to walk 1.5 miles per day.   Reports limiting factor is his intermittent sternal pain when coughing/bending over/certain movements. He is taking PRN Tylenol and this helps. Chest xray obtained today. He had cardiac rehab orientation yesterday at [facility], and will start next week.   BP elevated today. Will start Losartan 12.5 mg daily per Dr Daniel Nones. Prescription sent to pt's pharmacy. Advised to notify VAD coordinators if he develops lightheadedness, dizziness, or otherwise does not feel well with starting new medication. They both verbalized understanding.   Placed on monitor today- HR 95 SR with occasional PVCs. Will stop Amiodarone today per Dr Daniel Nones. Recent Zio patch results are still pending.   Pts wife Ricardo Zuniga is changing his driveline twice weekly. Dressing changed today in clinic. See documentation below.    Vital Signs:  Doppler Pressure 152  Automatc BP: Laying: 133/97 (111) repeat: 116/103 (109)  Sitting: 120/89 (95) HR: 95 SR w/ occ PVCs  SPO2: 100% %   Weight: 176.2 lb w/ eqt Last weight: 173.8 lb Home weights:  lbs this morning    VAD Indication: Destination Therapy for age   VAD interrogation & Equipment Management: Speed: 5500 Flow: 4.0 Power: 4.2 w    PI: 5.6   Alarms: none Events: 10 - 20  Fixed speed 5500 Low speed limit: 5200   Primary Controller:  Replace back up battery in 31 months. Back up controller:   Replace back up battery in 30 months.   Annual Equipment Maintenance on UBC/PM was performed on 10/07/22.    I reviewed the LVAD parameters from today and compared the results to the patient's prior recorded data. LVAD interrogation was  NEGATIVE for significant power changes, NEGATIVE for clinical alarms and STABLE for PI events/speed drops. No programming changes were made and pump is functioning within specified parameters. Pt is performing daily controller and system monitor self tests along with completing weekly and monthly maintenance for LVAD equipment.   LVAD equipment check completed and is in good working order. Back-up equipment present. Charged back up battery and performed self-test on equipment.    Exit Site Care: Drive line is being maintained twice weekly by his wife Ricardo Zuniga. Existing VAD dressing removed and site care performed using sterile technique. Drive line exit site cleaned with Chlora prep applicators x 2, allowed to dry, and gauze dressing withOUT Silver strip applied. Drive line exit site is healing and partially incorporated. The velour is fully implanted at exit site. Large scab present at exit site. No redness, scant serous drainage, no tenderness, rash, or foul odor noted. Anchor replaced over mepitel tape for skin protection. Pt denies fever or chills. Continue twice weekly dressing changes. Pt has adequate dressing supplies at home.   Device: none   BP & Labs: MAP 152 - Doppler is reflecting modified systolic   Hgb 99991111 - No S/S of bleeding. Specifically denies melena/BRBPR or nosebleeds.   LDH stable at 174 with established baseline of 170 - 208. Denies tea-colored urine. No power elevations noted on interrogation.    Plan: Stop Amiodarone Start Losartan 12.5 mg daily for elevated BP Coumadin dosing per Lauren PharmD Return to clinic in 1 week for BP check  Return to clinic in 3 weeks for RAMP echo and follow up with Dr Daniel Nones  Ricardo Monte RN [place] Coordinator  Office: [phone]  24/7 Pager: [phone]

## 2022-11-05 NOTE — Patient Instructions (Addendum)
Stop Amiodarone Start Losartan 12.5 mg daily for elevated BP Coumadin dosing per Lauren PharmD Return to clinic in 1 week for BP check Return to clinic in 3 weeks for RAMP echo and follow up with Dr Daniel Nones

## 2022-11-06 ENCOUNTER — Telehealth (HOSPITAL_COMMUNITY): Payer: Self-pay | Admitting: Unknown Physician Specialty

## 2022-11-06 ENCOUNTER — Encounter (HOSPITAL_COMMUNITY): Payer: Medicare PPO | Admitting: Cardiology

## 2022-11-06 ENCOUNTER — Ambulatory Visit (HOSPITAL_COMMUNITY): Payer: Medicare PPO

## 2022-11-06 DIAGNOSIS — Z7984 Long term (current) use of oral hypoglycemic drugs: Secondary | ICD-10-CM | POA: Diagnosis not present

## 2022-11-06 DIAGNOSIS — Z95811 Presence of heart assist device: Secondary | ICD-10-CM | POA: Diagnosis not present

## 2022-11-06 DIAGNOSIS — I5023 Acute on chronic systolic (congestive) heart failure: Secondary | ICD-10-CM | POA: Diagnosis not present

## 2022-11-06 DIAGNOSIS — I493 Ventricular premature depolarization: Secondary | ICD-10-CM | POA: Diagnosis not present

## 2022-11-06 DIAGNOSIS — Z7901 Long term (current) use of anticoagulants: Secondary | ICD-10-CM | POA: Diagnosis not present

## 2022-11-06 DIAGNOSIS — I471 Supraventricular tachycardia, unspecified: Secondary | ICD-10-CM | POA: Diagnosis not present

## 2022-11-06 DIAGNOSIS — I428 Other cardiomyopathies: Secondary | ICD-10-CM | POA: Diagnosis not present

## 2022-11-06 DIAGNOSIS — D62 Acute posthemorrhagic anemia: Secondary | ICD-10-CM | POA: Diagnosis not present

## 2022-11-06 DIAGNOSIS — E1140 Type 2 diabetes mellitus with diabetic neuropathy, unspecified: Secondary | ICD-10-CM | POA: Diagnosis not present

## 2022-11-06 NOTE — Telephone Encounter (Signed)
Chest xray unremarkable from yesterday. Pt informed he can drive. Pt was thrilled as today is his [birth year].   Tanda Rockers RN, BSN VAD Coordinator 24/7 Pager [phone]

## 2022-11-07 ENCOUNTER — Other Ambulatory Visit (HOSPITAL_COMMUNITY): Payer: Self-pay

## 2022-11-07 ENCOUNTER — Other Ambulatory Visit (HOSPITAL_COMMUNITY): Payer: Self-pay | Admitting: *Deleted

## 2022-11-07 DIAGNOSIS — Z7901 Long term (current) use of anticoagulants: Secondary | ICD-10-CM

## 2022-11-07 DIAGNOSIS — Z95811 Presence of heart assist device: Secondary | ICD-10-CM

## 2022-11-10 ENCOUNTER — Encounter (HOSPITAL_COMMUNITY)
Admission: RE | Admit: 2022-11-10 | Discharge: 2022-11-10 | Disposition: A | Discharge status: Home / Self Care | Payer: Medicare PPO | Source: Ambulatory Visit | Attending: Cardiology | Admitting: Cardiology

## 2022-11-10 DIAGNOSIS — Z7984 Long term (current) use of oral hypoglycemic drugs: Secondary | ICD-10-CM | POA: Insufficient documentation

## 2022-11-10 DIAGNOSIS — Z8679 Personal history of other diseases of the circulatory system: Secondary | ICD-10-CM | POA: Insufficient documentation

## 2022-11-10 DIAGNOSIS — R55 Syncope and collapse: Secondary | ICD-10-CM | POA: Diagnosis not present

## 2022-11-10 DIAGNOSIS — I2510 Atherosclerotic heart disease of native coronary artery without angina pectoris: Secondary | ICD-10-CM | POA: Insufficient documentation

## 2022-11-10 DIAGNOSIS — Z79899 Other long term (current) drug therapy: Secondary | ICD-10-CM | POA: Diagnosis not present

## 2022-11-10 DIAGNOSIS — Z95811 Presence of heart assist device: Secondary | ICD-10-CM | POA: Insufficient documentation

## 2022-11-10 DIAGNOSIS — I5023 Acute on chronic systolic (congestive) heart failure: Secondary | ICD-10-CM | POA: Insufficient documentation

## 2022-11-10 DIAGNOSIS — E1140 Type 2 diabetes mellitus with diabetic neuropathy, unspecified: Secondary | ICD-10-CM | POA: Diagnosis not present

## 2022-11-10 DIAGNOSIS — Z87891 Personal history of nicotine dependence: Secondary | ICD-10-CM | POA: Insufficient documentation

## 2022-11-10 DIAGNOSIS — Z7901 Long term (current) use of anticoagulants: Secondary | ICD-10-CM | POA: Diagnosis not present

## 2022-11-10 DIAGNOSIS — I428 Other cardiomyopathies: Secondary | ICD-10-CM | POA: Insufficient documentation

## 2022-11-10 DIAGNOSIS — I5022 Chronic systolic (congestive) heart failure: Secondary | ICD-10-CM | POA: Insufficient documentation

## 2022-11-10 DIAGNOSIS — R42 Dizziness and giddiness: Secondary | ICD-10-CM | POA: Diagnosis not present

## 2022-11-10 DIAGNOSIS — R5383 Other fatigue: Secondary | ICD-10-CM | POA: Insufficient documentation

## 2022-11-10 LAB — GLUCOSE, CAPILLARY: Glucose-Capillary: 139 mg/dL — ABNORMAL HIGH (ref 70–99)

## 2022-11-10 NOTE — Progress Notes (Signed)
Daily Session Note  Patient Details  Name: Ricardo Zuniga MRN: [id number] Date of Birth: 11/08/1950 Referring Provider:   Flowsheet Row CARDIAC REHAB PHASE II ORIENTATION from 11/04/2022 in [place]  Referring Provider Dr. Tenny Craw       Encounter Date: 11/10/2022  Check In:  Session Check In - 11/10/22 0815       Check-In   Supervising physician immediately available to respond to emergencies [facility] MD immediately available    Physician(s) Dr. Dellia Cloud    Location [facility]    Staff Present Hoy Register MHA, MS, ACSM-CEP;Leana Roe, BS, Exercise Physiologist;Daphyne Hassell Done, RN, BSN    Virtual Visit No    Medication changes reported     Yes    Comments [name] added Losartan 12.5 mg daily and d/c amiodarone    Fall or balance concerns reported    No    Tobacco Cessation No Change    Warm-up and Cool-down Performed as group-led instruction    Resistance Training Performed Yes    VAD Patient? Yes    PAD/SET Patient? No      VAD patient   Has back up controller? Yes    Has spare charged batteries? Yes    Has battery cables? Yes    Has compatible battery clips? Yes      Pain Assessment   Currently in Pain? No/denies    Multiple Pain Sites No             Capillary Blood Glucose: No results found for this or any previous visit (from the past 24 hour(s)).    Social History   Tobacco Use  Smoking Status Former   Packs/day: 2.00   Years: 17.00   Additional pack years: 0.00   Total pack years: 34.00   Types: Cigarettes   Start date: 02/17/1968   Quit date: 02/28/1986   Years since quitting: 36.7   Passive exposure: Never  Smokeless Tobacco Never    Goals Met:  Independence with exercise equipment Exercise tolerated well No report of concerns or symptoms today Strength training completed today  Goals Unmet:  Not Applicable  Comments: checkout time is 0915   Dr. Carlyle Dolly is Medical Director  for [facility]

## 2022-11-11 ENCOUNTER — Ambulatory Visit (HOSPITAL_COMMUNITY): Payer: Self-pay | Admitting: Pharmacist

## 2022-11-11 ENCOUNTER — Encounter (HOSPITAL_COMMUNITY): Payer: Self-pay

## 2022-11-11 ENCOUNTER — Ambulatory Visit (HOSPITAL_COMMUNITY)
Admission: RE | Admit: 2022-11-11 | Discharge: 2022-11-11 | Disposition: A | Discharge status: Home / Self Care | Payer: Medicare PPO | Source: Ambulatory Visit | Attending: Cardiology | Admitting: Cardiology

## 2022-11-11 ENCOUNTER — Encounter (HOSPITAL_COMMUNITY): Payer: Medicare PPO | Admitting: Cardiology

## 2022-11-11 DIAGNOSIS — Z79899 Other long term (current) drug therapy: Secondary | ICD-10-CM | POA: Diagnosis not present

## 2022-11-11 DIAGNOSIS — Z95811 Presence of heart assist device: Secondary | ICD-10-CM | POA: Diagnosis not present

## 2022-11-11 DIAGNOSIS — Z7901 Long term (current) use of anticoagulants: Secondary | ICD-10-CM | POA: Insufficient documentation

## 2022-11-11 LAB — BASIC METABOLIC PANEL
Anion gap: 13 (ref 5–15)
BUN: 17 mg/dL (ref 8–23)
CO2: 23 mmol/L (ref 22–32)
Calcium: 9.7 mg/dL (ref 8.9–10.3)
Chloride: 100 mmol/L (ref 98–111)
Creatinine, Ser: 0.96 mg/dL (ref 0.61–1.24)
GFR, Estimated: >60 mL/min (ref >60)
Glucose, Bld: 145 mg/dL — ABNORMAL HIGH (ref 70–99)
Potassium: 4.4 mmol/L (ref 3.5–5.1)
Sodium: 136 mmol/L (ref 135–145)

## 2022-11-11 LAB — PROTIME-INR
INR: 2 — ABNORMAL HIGH (ref 0.8–1.2)
Prothrombin Time: 22.8 seconds — ABNORMAL HIGH (ref 11.4–15.2)

## 2022-11-11 MED ORDER — WARFARIN SODIUM 5 MG PO TABS: ORAL_TABLET | ORAL | 11 refills | Status: DC

## 2022-11-11 NOTE — Progress Notes (Signed)
Patient presents for BP check in [facility] today with his wife [name]. Reports no problems with VAD equipment or concerns with drive line.  Pt states he has been feeling great. Denies shortness of breath, swelling, and signs of bleeding. He started cardiac rehab yesterday.   BP improved today. He is taking Losartan 12.5 mg daily as prescribed last visit. He is tolerating well. Does endorse dizziness/lightheadedness occasionally for about 10 minutes in the afternoon. This resolves with rest. Discussed taking Losartan in the evenings or at bedtime to see if this resolves. Discussed need to notify VAD coordinators if this continues or worsens. They both verbalized understanding.   Vital Signs:  Doppler Pressure 92 Automatc BP:   Laying: 124/82 (95)  Sitting: 97/63 (78)  Standing: 102/54 (75) HR: 95  SPO2: 100%  Plan:  You may take Losartan in the evenings if you chose Coumadin dosing per Lauren PharmD Return to clinic for previously scheduled appt   Emerson Monte RN [place] Coordinator  Office: [phone]  24/7 Pager: [phone]

## 2022-11-11 NOTE — Patient Instructions (Signed)
You may take Losartan in the evenings if you chose Coumadin dosing per Lauren PharmD Return to clinic for previously scheduled appt

## 2022-11-11 NOTE — Addendum Note (Signed)
Encounter addended by: Micki Riley, RN on: 11/11/2022 3:00 PM  Actions taken: Imaging Exam begun

## 2022-11-11 NOTE — Addendum Note (Signed)
Encounter addended by: Mertha Baars, RN on: 11/11/2022 11:43 AM  Actions taken: Vitals modified, Clinical Note Signed

## 2022-11-11 NOTE — Progress Notes (Signed)
LVAD INR 

## 2022-11-12 ENCOUNTER — Encounter (HOSPITAL_COMMUNITY)
Admission: RE | Admit: 2022-11-12 | Discharge: 2022-11-12 | Disposition: A | Discharge status: Home / Self Care | Payer: Medicare PPO | Source: Ambulatory Visit | Attending: Cardiology | Admitting: Cardiology

## 2022-11-12 DIAGNOSIS — E1140 Type 2 diabetes mellitus with diabetic neuropathy, unspecified: Secondary | ICD-10-CM | POA: Diagnosis not present

## 2022-11-12 DIAGNOSIS — R55 Syncope and collapse: Secondary | ICD-10-CM | POA: Diagnosis not present

## 2022-11-12 DIAGNOSIS — I5023 Acute on chronic systolic (congestive) heart failure: Secondary | ICD-10-CM

## 2022-11-12 DIAGNOSIS — Z95811 Presence of heart assist device: Secondary | ICD-10-CM

## 2022-11-12 DIAGNOSIS — Z79899 Other long term (current) drug therapy: Secondary | ICD-10-CM | POA: Diagnosis not present

## 2022-11-12 DIAGNOSIS — R42 Dizziness and giddiness: Secondary | ICD-10-CM | POA: Diagnosis not present

## 2022-11-12 DIAGNOSIS — I2510 Atherosclerotic heart disease of native coronary artery without angina pectoris: Secondary | ICD-10-CM | POA: Diagnosis not present

## 2022-11-12 DIAGNOSIS — I428 Other cardiomyopathies: Secondary | ICD-10-CM | POA: Diagnosis not present

## 2022-11-12 DIAGNOSIS — R5383 Other fatigue: Secondary | ICD-10-CM | POA: Diagnosis not present

## 2022-11-12 DIAGNOSIS — I5022 Chronic systolic (congestive) heart failure: Secondary | ICD-10-CM | POA: Diagnosis not present

## 2022-11-12 NOTE — Progress Notes (Signed)
Daily Session Note  Patient Details  Name: Ricardo Zuniga MRN: [id number] Date of Birth: [birth year] Referring Provider:   Flowsheet Row CARDIAC REHAB PHASE II ORIENTATION from 11/04/2022 in [place]  Referring Provider Dr. Tenny Craw       Encounter Date: 11/12/2022  Check In:  Session Check In - 11/12/22 0815       Check-In   Supervising physician immediately available to respond to emergencies [facility] MD immediately available    Physician(s) Dr. Dellia Cloud    Location [facility]    Staff Present Hoy Register MHA, MS, ACSM-CEP;[employer] BSN, RN;Heather Mel Al[place], [place], Exercise Physiologist    Virtual Visit No    Medication changes reported     Yes    Comments [name] added Losartan 12.5 mg daily and d/c amiodarone    Fall or balance concerns reported    No    Tobacco Cessation No Change    Warm-up and Cool-down Performed as group-led instruction    Resistance Training Performed Yes    VAD Patient? No    PAD/SET Patient? No      VAD patient   Has back up controller? Yes    Has spare charged batteries? Yes    Has battery cables? Yes    Has compatible battery clips? Yes      Pain Assessment   Currently in Pain? No/denies    Multiple Pain Sites No             Capillary Blood Glucose: Results for orders placed or performed during the hospital encounter of 11/11/22 (from the past 24 hour(s))  Protime-INR     Status: Abnormal   Collection Time: 11/11/22 10:57 AM  Result Value Ref Range   Prothrombin Time 22.8 (H) 11.4 - 15.2 seconds   INR 2.0 (H) 0.8 - 1.2  Basic metabolic panel     Status: Abnormal   Collection Time: 11/11/22 10:57 AM  Result Value Ref Range   Sodium 136 135 - 145 mmol/L   Potassium 4.4 3.5 - 5.1 mmol/L   Chloride 100 98 - 111 mmol/L   CO2 23 22 - 32 mmol/L   Glucose, Bld 145 (H) 70 - 99 mg/dL   BUN 17 8 - 23 mg/dL   Creatinine, Ser 0.96 0.61 - 1.24 mg/dL   Calcium 9.7 8.9 - 10.3 mg/dL   GFR,  Estimated >60 >60 mL/min   Anion gap 13 5 - 15      Social History   Tobacco Use  Smoking Status Former   Packs/day: 2.00   Years: 17.00   Additional pack years: 0.00   Total pack years: 34.00   Types: Cigarettes   Start date: 02/17/1968   Quit date: 02/28/1986   Years since quitting: 36.7   Passive exposure: Never  Smokeless Tobacco Never    Goals Met:  Independence with exercise equipment Exercise tolerated well No report of concerns or symptoms today Strength training completed today  Goals Unmet:  Not Applicable  Comments: checkout time is 0915   Dr. Carlyle Dolly is Medical Director for [facility]

## 2022-11-13 ENCOUNTER — Other Ambulatory Visit (HOSPITAL_COMMUNITY): Payer: Self-pay

## 2022-11-13 ENCOUNTER — Other Ambulatory Visit: Payer: Self-pay

## 2022-11-13 NOTE — Progress Notes (Signed)
ADVANCED HEART FAILURE CLINIC NOTE  Referring Physician: Glenda Chroman, MD  Primary Care: Glenda Chroman, MD Primary Cardiologist: Charletta Cousin Heart Failure: Hebert Soho  HPI: Ricardo Zuniga is a very pleasant 72 YO WM w/ HFrEF 2/2 nonischemic cardiomyopathy, T2DM and hx of acute promyelocytic leukemia s/p ATRA ([facility] 2014) that was initially see at [facility] in October 2023 after presenting with a 1 week history of orthopnea, PND & LE edema. He had a TTE prior to admission w/ LVEF of 40-45%. Echo during admit was notable for LVEF of 30%-35%. LHC/RHC during admit w/ nonobstructive CAD & moderately reduced CI (2.1 L/min/m2). His cardiac history dates back to 2015 when he was found to have an LVEF of 25% felt to be triggered by endocarditis. He had improvement in LV function for several years, however, unfortunately had a fairly rapid decline in LVEF and functional status over the winter months of 2023. The decision was made to proceed with HMIII implantation. Ricardo Zuniga underwent successful implant on 09/24/22 as DT. His post-operative course was fairly unremarkable. He had rapid improvement in PA pressures after HMIII implantation.   Interval hx: No complaints. Doing very well. Starting cardiac rehab today. No speed drops, lightheadedness, chest pain; improving appetite. Able to walk upstairs in his home now.   Past Medical History:  Diagnosis Date   Anal fissure    APML (acute promyelocytic leukemia) in remission    Completed treatment 04/2013   CHF (congestive heart failure)    Coronary artery disease    Nonobstructive at cardiac catheterization 09/2018   Difficult intubation    w/shoulder surgery at surgical center between 2005-2008   Gastroesophageal reflux disease    History of blood transfusion 2014   History of kidney stones    passed stones   Hypercholesteremia    Neuropathy    bilateral feet   Nonischemic cardiomyopathy    a. EF 20% in 2015 - thought to  possibly be viral induced or chemotherapy induced from treatments in 2014 b. at 35-40% by echo in 05/2016    Peptic ulcer disease    Pneumonia    Yrs ago   Type 2 diabetes mellitus    Ureteral colic    Vertigo    tx with valium prn   Wears glasses    Wears hearing aid in both ears     Current Outpatient Medications  Medication Sig Dispense Refill   acetaminophen (TYLENOL) 650 MG CR tablet Take 650-1,300 mg by mouth every 8 (eight) hours as needed for pain.     atorvastatin (LIPITOR) 40 MG tablet Take 1 tablet by mouth once daily 90 tablet 1   Cholecalciferol (VITAMIN D3) 125 MCG (5000 UT) TABS Take 5,000 Units by mouth in the morning.     Digoxin 62.5 MCG TABS Take 1 tablet by mouth daily. 30 tablet 5   docusate sodium (COLACE) 100 MG capsule Take 200 mg by mouth every evening.     empagliflozin (JARDIANCE) 10 MG TABS tablet Take 10 mg by mouth every evening.     furosemide (LASIX) 20 MG tablet Take 1 tablet (20 mg total) by mouth daily. 90 tablet 3   losartan (COZAAR) 25 MG tablet Take 0.5 tablets (12.5 mg total) by mouth daily. 45 tablet 3   omeprazole (PRILOSEC) 20 MG capsule Take 20 mg by mouth daily before breakfast.     potassium chloride SA (KLOR-CON M) 20 MEQ tablet Take 1 tablet (20 mEq total) by mouth  daily. 60 tablet 3   repaglinide (PRANDIN) 2 MG tablet Take 2-4 mg by mouth See admin instructions. Take 2 tablets (4 mg) by mouth in the morning & take 1 tablet (2 mg) by mouth in the evening.     spironolactone (ALDACTONE) 25 MG tablet Take 1 tablet (25 mg total) by mouth daily. 45 tablet 3   traZODone (DESYREL) 100 MG tablet Take 1 tablet (100 mg total) by mouth at bedtime as needed for sleep. 30 tablet 3   warfarin (COUMADIN) 5 MG tablet Take 2.5 mg (1/2 tab) every Tuesday/Saturday and 5 mg (1 tab) all other days or as directed by HF Clinic 90 tablet 11   No current facility-administered medications for this encounter.    Allergies  Allergen Reactions   Zestril  [Lisinopril] Cough      Social History   Socioeconomic History   Marital status: Married    Spouse name: Not on file   Number of children: Not on file   Years of education: Not on file   Highest education level: Not on file  Occupational History   Occupation: RETIRED    Comment: [employer]   Occupation: SECURITY GUARD  Tobacco Use   Smoking status: Former    Packs/day: 2.00    Years: 17.00    Additional pack years: 0.00    Total pack years: 34.00    Types: Cigarettes    Start date: 02/17/1968    Quit date: 02/28/1986    Years since quitting: 36.7    Passive exposure: Never   Smokeless tobacco: Never  Vaping Use   Vaping Use: Never used  Substance and Sexual Activity   Alcohol use: No    Alcohol/week: 0.0 standard drinks of alcohol   Drug use: No   Sexual activity: Not Currently  Other Topics Concern   Not on file  Social History Narrative   Not on file   Social Determinants of Health   Financial Resource Strain: Low Risk  (09/03/2022)   Overall Financial Resource Strain (CARDIA)    Difficulty of Paying Living Expenses: Not hard at all  Food Insecurity: No Food Insecurity (09/23/2022)   Hunger Vital Sign    Worried About Running Out of Food in the Last Year: Never true    Ran Out of Food in the Last Year: Never true  Transportation Needs: No Transportation Needs (09/23/2022)   PRAPARE - Hydrologist (Medical): No    Lack of Transportation (Non-Medical): No  Physical Activity: Not on file  Stress: Not on file  Social Connections: Not on file  Intimate Partner Violence: Not At Risk (09/23/2022)   Humiliation, Afraid, Rape, and Kick questionnaire    Fear of Current or Ex-Partner: No    Emotionally Abused: No    Physically Abused: No    Sexually Abused: No      Family History  Problem Relation Age of Onset   Colon cancer Other     PHYSICAL EXAM: Vital Signs:  Doppler Pressure 112  Automatc BP: 120/72 (89) HR: 87  SPO2:  98%   Weight: 173.8 lb w/o eqt Last weight: 177.2 lb Home weights: 163 lbs this morning  VAD interrogation & Equipment Management: Speed: 5500 Flow: 4.0 Power: 4.2 w    PI: 4.5  General: sitting comfortably in NAD.  Neck: JVP <7cm Lungs: Clear to auscultation bilaterally with normal respiratory effort. CV: normal LVAD hum; minimally pulsatile radial pulses Abdomen: Soft, nontender, no hepatosplenomegaly, driveline  w/o erythema or induration. Skin: Intact without lesions or rashes.  Neurologic: Alert and oriented x 3.  Psych: Normal affect. Extremities: No clubbing or cyanosis. Warm to touch.  HEENT: Normal.   ECHO: - Echo (06/08/22): EF 30-35%, global HK, RV mildly reduced, mild MR   - Echo (7/23): EF 45%, RV normal   - Echo (8/22): EF 40-45%, RV normal   - Echo (7/21): EF 50%, RV normal  - LHC (09/2018): Moderate nonobstructive one-vessel coronary artery disease, prox RCA 40% stenosed.   - Echo (08/2018): EF 40%, RV normal  - Echo (05/2016): EF 35-40%, RV normal   - Echo (05/2014): EF 25-30%, RV mildly reduced  - Echo (05/2012): EF 50%, RV normal  - Echo (1/24): EF 25%, severe LV dilation, global hypokinesis, moderate RV dysfunction, mild RV enlargement, moderate AI, mild-moderate MR.  CATH: R/LHC (10/23): nonobstructive CAD RA 8 PA 52/25 (34 mean) PCWP 14 (mean) CO/CI (Fick): 4.3/2.1                                 PVR 4.6 WU          PAPi 3.4             RHC (1/24): RA mean 10 PA 69/28, mean 43 PCWP mean 20 CI 1.85 PVR 6.22 WU PAPi 4.1     ASSESSMENT & PLAN:  1. Chronic systolic heart failure s/p HMIII Destination therapy - Significant improvement in functional status since HMIII placement.  - Euvolemic on exam today with minimal PI events; no low flows or alarms.  - INR/BMP today  - Stop ASA 81mg  - Continue digoxin 62.13mcg for RV support. Plan for echo with ramp study at next visit.  - Continue jardiance 10mg , lasix 20mg .  - Continue spironolactone 25mg   daily.  - D/C amiodarone - hypertensive; start losartan 12.5mg  daily.  - warfarin dosing discussed with pharmacy - MAPs elevated, starting losartan.   2. Hx of atrial tach, PVCs & NSVT - D/C amiodarone; Ziopatch without significant burden.   3. Hx of promyelocytic leukemia: S/P ATRA in 2014 at [facility].   4. Hx of endocarditis: 2015  I reviewed the LVAD parameters from today and compared the results to the patient's prior recorded data. LVAD interrogation was NEGATIVE for significant power changes, NEGATIVE for clinical alarms and STABLE for PI events/speed drops. No programming changes were made and pump is functioning within specified parameters. Pt is performing daily controller and system monitor self tests along with completing weekly and monthly maintenance for LVAD equipment.   Lindalou Hose Haiden Rawlinson 11/13/2022 5:49 PM

## 2022-11-14 ENCOUNTER — Encounter (HOSPITAL_COMMUNITY)
Admission: RE | Admit: 2022-11-14 | Discharge: 2022-11-14 | Disposition: A | Discharge status: Home / Self Care | Payer: Medicare PPO | Source: Ambulatory Visit | Attending: Cardiology | Admitting: Cardiology

## 2022-11-14 DIAGNOSIS — E1140 Type 2 diabetes mellitus with diabetic neuropathy, unspecified: Secondary | ICD-10-CM | POA: Diagnosis not present

## 2022-11-14 DIAGNOSIS — I428 Other cardiomyopathies: Secondary | ICD-10-CM | POA: Diagnosis not present

## 2022-11-14 DIAGNOSIS — Z79899 Other long term (current) drug therapy: Secondary | ICD-10-CM | POA: Diagnosis not present

## 2022-11-14 DIAGNOSIS — Z95811 Presence of heart assist device: Secondary | ICD-10-CM

## 2022-11-14 DIAGNOSIS — R55 Syncope and collapse: Secondary | ICD-10-CM | POA: Diagnosis not present

## 2022-11-14 DIAGNOSIS — R5383 Other fatigue: Secondary | ICD-10-CM | POA: Diagnosis not present

## 2022-11-14 DIAGNOSIS — I5023 Acute on chronic systolic (congestive) heart failure: Secondary | ICD-10-CM

## 2022-11-14 DIAGNOSIS — I2510 Atherosclerotic heart disease of native coronary artery without angina pectoris: Secondary | ICD-10-CM | POA: Diagnosis not present

## 2022-11-14 DIAGNOSIS — R42 Dizziness and giddiness: Secondary | ICD-10-CM | POA: Diagnosis not present

## 2022-11-14 DIAGNOSIS — I5022 Chronic systolic (congestive) heart failure: Secondary | ICD-10-CM | POA: Diagnosis not present

## 2022-11-14 NOTE — Addendum Note (Signed)
Encounter addended by: Crissie Figures, RN on: 11/14/2022 9:34 AM  Actions taken: Imaging Exam begun

## 2022-11-14 NOTE — Progress Notes (Signed)
Daily Session Note  Patient Details  Name: Ricardo Zuniga MRN: [id number] Date of Birth: [birth year] Referring Provider:   Flowsheet Row CARDIAC REHAB PHASE II ORIENTATION from 11/04/2022 in [facility] CARDIAC REHABILITATION  Referring Provider Dr. Delia Chimes       Encounter Date: 11/14/2022  Check In:  Session Check In - 11/14/22 0815       Check-In   Supervising physician immediately available to respond to emergencies [facility] MD immediately available    Physician(s) Dr. Jenene Slicker    Location [facility]    Staff Present Ross Ludwig, BS, Exercise Physiologist;Annelies Coyt, RN;Daphyne Daphine Deutscher, RN, BSN;Dalton Debbe Mounts, MS, ACSM-CEP;Debra Laural Benes, RN, BSN    Virtual Visit No    Medication changes reported     No    Fall or balance concerns reported    No    Tobacco Cessation No Change    Warm-up and Cool-down Performed as group-led instruction    Resistance Training Performed Yes    VAD Patient? Yes    PAD/SET Patient? No      VAD patient   Has back up controller? Yes    Has spare charged batteries? Yes    Has battery cables? Yes    Has compatible battery clips? Yes      Pain Assessment   Currently in Pain? No/denies    Multiple Pain Sites No             Capillary Blood Glucose: No results found for this or any previous visit (from the past 24 hour(s)).    Social History   Tobacco Use  Smoking Status Former   Packs/day: 2.00   Years: 17.00   Additional pack years: 0.00   Total pack years: 34.00   Types: Cigarettes   Start date: 02/17/1968   Quit date: 02/28/1986   Years since quitting: 36.7   Passive exposure: Never  Smokeless Tobacco Never    Goals Met:  Independence with exercise equipment Exercise tolerated well No report of concerns or symptoms today Strength training completed today  Goals Unmet:  Not Applicable  Comments: check out @ 9:15am   Dr. Dina Rich is Medical Director for [facility] Cardiac Rehab

## 2022-11-17 ENCOUNTER — Encounter (HOSPITAL_COMMUNITY)
Admission: RE | Admit: 2022-11-17 | Discharge: 2022-11-17 | Disposition: A | Discharge status: Home / Self Care | Payer: Medicare PPO | Source: Ambulatory Visit | Attending: Cardiology | Admitting: Cardiology

## 2022-11-17 VITALS — Wt 178.4 lb

## 2022-11-17 DIAGNOSIS — Z79899 Other long term (current) drug therapy: Secondary | ICD-10-CM | POA: Diagnosis not present

## 2022-11-17 DIAGNOSIS — Z95811 Presence of heart assist device: Secondary | ICD-10-CM | POA: Diagnosis not present

## 2022-11-17 DIAGNOSIS — R55 Syncope and collapse: Secondary | ICD-10-CM | POA: Diagnosis not present

## 2022-11-17 DIAGNOSIS — R5383 Other fatigue: Secondary | ICD-10-CM | POA: Diagnosis not present

## 2022-11-17 DIAGNOSIS — R42 Dizziness and giddiness: Secondary | ICD-10-CM | POA: Diagnosis not present

## 2022-11-17 DIAGNOSIS — E1140 Type 2 diabetes mellitus with diabetic neuropathy, unspecified: Secondary | ICD-10-CM | POA: Diagnosis not present

## 2022-11-17 DIAGNOSIS — I2510 Atherosclerotic heart disease of native coronary artery without angina pectoris: Secondary | ICD-10-CM | POA: Diagnosis not present

## 2022-11-17 DIAGNOSIS — I428 Other cardiomyopathies: Secondary | ICD-10-CM | POA: Diagnosis not present

## 2022-11-17 DIAGNOSIS — I5023 Acute on chronic systolic (congestive) heart failure: Secondary | ICD-10-CM

## 2022-11-17 DIAGNOSIS — I5022 Chronic systolic (congestive) heart failure: Secondary | ICD-10-CM | POA: Diagnosis not present

## 2022-11-17 NOTE — Addendum Note (Signed)
Encounter addended by: Crissie Figures, RN on: 11/17/2022 10:41 AM  Actions taken: Imaging Exam begun

## 2022-11-17 NOTE — Progress Notes (Signed)
Daily Session Note  Patient Details  Name: Ricardo Zuniga MRN: [id number] Date of Birth: 1951-06-05 Referring Provider:   Flowsheet Row CARDIAC REHAB PHASE II ORIENTATION from 11/04/2022 in [facility] CARDIAC REHABILITATION  Referring Provider Dr. Delia Chimes       Encounter Date: 11/17/2022  Check In:  Session Check In - 11/17/22 0815       Check-In   Supervising physician immediately available to respond to emergencies [facility] MD immediately available    Physician(s) Dr. Wyline Mood    Location [facility]    Staff Present Ross Ludwig, BS, Exercise Physiologist;Dalton Debbe Mounts, MS, ACSM-CEP;Staci Righter, RN, BSN    Virtual Visit No    Medication changes reported     No    Fall or balance concerns reported    No    Tobacco Cessation No Change    Warm-up and Cool-down Performed as group-led instruction    Resistance Training Performed Yes    VAD Patient? Yes    PAD/SET Patient? No      VAD patient   Has back up controller? Yes    Has spare charged batteries? Yes    Has battery cables? Yes    Has compatible battery clips? Yes      Pain Assessment   Currently in Pain? No/denies    Multiple Pain Sites No             Capillary Blood Glucose: No results found for this or any previous visit (from the past 24 hour(s)).    Social History   Tobacco Use  Smoking Status Former   Packs/day: 2.00   Years: 17.00   Additional pack years: 0.00   Total pack years: 34.00   Types: Cigarettes   Start date: 02/17/1968   Quit date: 02/28/1986   Years since quitting: 36.7   Passive exposure: Never  Smokeless Tobacco Never    Goals Met:  Independence with exercise equipment Exercise tolerated well No report of concerns or symptoms today Strength training completed today  Goals Unmet:  Not Applicable  Comments: check out 915   Dr. Dina Rich is Medical Director for [facility] Cardiac Rehab

## 2022-11-19 ENCOUNTER — Encounter (HOSPITAL_COMMUNITY)
Admission: RE | Admit: 2022-11-19 | Discharge: 2022-11-19 | Disposition: A | Discharge status: Home / Self Care | Payer: Medicare PPO | Source: Ambulatory Visit | Attending: Cardiology | Admitting: Cardiology

## 2022-11-19 DIAGNOSIS — I2510 Atherosclerotic heart disease of native coronary artery without angina pectoris: Secondary | ICD-10-CM | POA: Diagnosis not present

## 2022-11-19 DIAGNOSIS — R5383 Other fatigue: Secondary | ICD-10-CM | POA: Diagnosis not present

## 2022-11-19 DIAGNOSIS — E1140 Type 2 diabetes mellitus with diabetic neuropathy, unspecified: Secondary | ICD-10-CM | POA: Diagnosis not present

## 2022-11-19 DIAGNOSIS — I428 Other cardiomyopathies: Secondary | ICD-10-CM | POA: Diagnosis not present

## 2022-11-19 DIAGNOSIS — R42 Dizziness and giddiness: Secondary | ICD-10-CM | POA: Diagnosis not present

## 2022-11-19 DIAGNOSIS — I5022 Chronic systolic (congestive) heart failure: Secondary | ICD-10-CM | POA: Diagnosis not present

## 2022-11-19 DIAGNOSIS — Z79899 Other long term (current) drug therapy: Secondary | ICD-10-CM | POA: Diagnosis not present

## 2022-11-19 DIAGNOSIS — Z95811 Presence of heart assist device: Secondary | ICD-10-CM | POA: Diagnosis not present

## 2022-11-19 DIAGNOSIS — R55 Syncope and collapse: Secondary | ICD-10-CM | POA: Diagnosis not present

## 2022-11-19 DIAGNOSIS — I5023 Acute on chronic systolic (congestive) heart failure: Secondary | ICD-10-CM

## 2022-11-19 NOTE — Progress Notes (Signed)
Daily Session Note  Patient Details  Name: Ricardo Zuniga MRN: [id number] Date of Birth: 07-04-1951 Referring Provider:   Flowsheet Row CARDIAC REHAB PHASE II ORIENTATION from 11/04/2022 in [facility] CARDIAC REHABILITATION  Referring Provider Dr. Delia Chimes       Encounter Date: 11/19/2022  Check In:  Session Check In - 11/19/22 0815       Check-In   Supervising physician immediately available to respond to emergencies [facility] MD immediately available    Physician(s) Dr. Wyline Mood    Location [facility]    Staff Present Ross Ludwig, BS, Exercise Physiologist;Jaida Basurto Laural Benes, RN, Pleas Koch, RN, BSN    Virtual Visit No    Medication changes reported     No    Fall or balance concerns reported    No    Tobacco Cessation No Change    Warm-up and Cool-down Performed as group-led instruction    Resistance Training Performed Yes    VAD Patient? Yes    PAD/SET Patient? No      VAD patient   Has spare charged batteries? Yes    Has battery cables? Yes    Has compatible battery clips? Yes      Pain Assessment   Currently in Pain? No/denies    Multiple Pain Sites No             Capillary Blood Glucose: No results found for this or any previous visit (from the past 24 hour(s)).    Social History   Tobacco Use  Smoking Status Former   Packs/day: 2.00   Years: 17.00   Additional pack years: 0.00   Total pack years: 34.00   Types: Cigarettes   Start date: 02/17/1968   Quit date: 02/28/1986   Years since quitting: 36.7   Passive exposure: Never  Smokeless Tobacco Never    Goals Met:  Independence with exercise equipment Exercise tolerated well No report of concerns or symptoms today Strength training completed today  Goals Unmet:  Not Applicable  Comments: Check out 930.   Dr. Dina Rich is Medical Director for [facility] Cardiac Rehab

## 2022-11-20 DIAGNOSIS — I493 Ventricular premature depolarization: Secondary | ICD-10-CM | POA: Diagnosis not present

## 2022-11-21 ENCOUNTER — Encounter (HOSPITAL_COMMUNITY)
Admission: RE | Admit: 2022-11-21 | Discharge: 2022-11-21 | Disposition: A | Discharge status: Home / Self Care | Payer: Medicare PPO | Source: Ambulatory Visit | Attending: Cardiology | Admitting: Cardiology

## 2022-11-21 DIAGNOSIS — Z95811 Presence of heart assist device: Secondary | ICD-10-CM | POA: Diagnosis not present

## 2022-11-21 DIAGNOSIS — Z79899 Other long term (current) drug therapy: Secondary | ICD-10-CM | POA: Diagnosis not present

## 2022-11-21 DIAGNOSIS — I2510 Atherosclerotic heart disease of native coronary artery without angina pectoris: Secondary | ICD-10-CM | POA: Diagnosis not present

## 2022-11-21 DIAGNOSIS — R42 Dizziness and giddiness: Secondary | ICD-10-CM | POA: Diagnosis not present

## 2022-11-21 DIAGNOSIS — I428 Other cardiomyopathies: Secondary | ICD-10-CM | POA: Diagnosis not present

## 2022-11-21 DIAGNOSIS — I5022 Chronic systolic (congestive) heart failure: Secondary | ICD-10-CM | POA: Diagnosis not present

## 2022-11-21 DIAGNOSIS — E1140 Type 2 diabetes mellitus with diabetic neuropathy, unspecified: Secondary | ICD-10-CM | POA: Diagnosis not present

## 2022-11-21 DIAGNOSIS — R55 Syncope and collapse: Secondary | ICD-10-CM | POA: Diagnosis not present

## 2022-11-21 DIAGNOSIS — R5383 Other fatigue: Secondary | ICD-10-CM | POA: Diagnosis not present

## 2022-11-21 DIAGNOSIS — I5023 Acute on chronic systolic (congestive) heart failure: Secondary | ICD-10-CM

## 2022-11-21 NOTE — Progress Notes (Signed)
Daily Session Note  Patient Details  Name: Ricardo Zuniga MRN: [id number] Date of Birth: 02/17/1951 Referring Provider:   Flowsheet Row CARDIAC REHAB PHASE II ORIENTATION from 11/04/2022 in [facility] CARDIAC REHABILITATION  Referring Provider Dr. Delia Chimes       Encounter Date: 11/21/2022  Check In:  Session Check In - 11/21/22 0815       Check-In   Supervising physician immediately available to respond to emergencies [facility] MD immediately available    Physician(s) Dr. Wyline Mood    Location [facility]    Staff Present Ross Ludwig, BS, Exercise Physiologist;Phyllis Billingsley, Margarite Gouge, RN, Pleas Koch, RN, BSN    Virtual Visit No    Medication changes reported     No    Fall or balance concerns reported    No    Tobacco Cessation No Change    Warm-up and Cool-down Performed as group-led instruction    Resistance Training Performed Yes    VAD Patient? Yes    PAD/SET Patient? No      VAD patient   Has back up controller? Yes    Has spare charged batteries? Yes    Has battery cables? Yes    Has compatible battery clips? Yes      Pain Assessment   Currently in Pain? No/denies    Multiple Pain Sites No             Capillary Blood Glucose: No results found for this or any previous visit (from the past 24 hour(s)).    Social History   Tobacco Use  Smoking Status Former   Packs/day: 2.00   Years: 17.00   Additional pack years: 0.00   Total pack years: 34.00   Types: Cigarettes   Start date: 02/17/1968   Quit date: 02/28/1986   Years since quitting: 36.7   Passive exposure: Never  Smokeless Tobacco Never    Goals Met:  Independence with exercise equipment Exercise tolerated well No report of concerns or symptoms today Strength training completed today  Goals Unmet:  Not Applicable  Comments: check out 0915   Dr. Dina Rich is Medical Director for [facility] Cardiac Rehab

## 2022-11-21 NOTE — Addendum Note (Signed)
Encounter addended by: Crissie Figures, RN on: 11/21/2022 10:13 AM  Actions taken: Imaging Exam ended

## 2022-11-24 ENCOUNTER — Encounter (HOSPITAL_COMMUNITY)
Admission: RE | Admit: 2022-11-24 | Discharge: 2022-11-24 | Disposition: A | Discharge status: Home / Self Care | Payer: Medicare PPO | Source: Ambulatory Visit | Attending: Cardiology | Admitting: Cardiology

## 2022-11-24 DIAGNOSIS — R5383 Other fatigue: Secondary | ICD-10-CM | POA: Diagnosis not present

## 2022-11-24 DIAGNOSIS — Z95811 Presence of heart assist device: Secondary | ICD-10-CM | POA: Diagnosis not present

## 2022-11-24 DIAGNOSIS — I428 Other cardiomyopathies: Secondary | ICD-10-CM | POA: Diagnosis not present

## 2022-11-24 DIAGNOSIS — I2510 Atherosclerotic heart disease of native coronary artery without angina pectoris: Secondary | ICD-10-CM | POA: Diagnosis not present

## 2022-11-24 DIAGNOSIS — R42 Dizziness and giddiness: Secondary | ICD-10-CM | POA: Diagnosis not present

## 2022-11-24 DIAGNOSIS — E1140 Type 2 diabetes mellitus with diabetic neuropathy, unspecified: Secondary | ICD-10-CM | POA: Diagnosis not present

## 2022-11-24 DIAGNOSIS — Z79899 Other long term (current) drug therapy: Secondary | ICD-10-CM | POA: Diagnosis not present

## 2022-11-24 DIAGNOSIS — R55 Syncope and collapse: Secondary | ICD-10-CM | POA: Diagnosis not present

## 2022-11-24 DIAGNOSIS — I5022 Chronic systolic (congestive) heart failure: Secondary | ICD-10-CM | POA: Diagnosis not present

## 2022-11-24 DIAGNOSIS — I5023 Acute on chronic systolic (congestive) heart failure: Secondary | ICD-10-CM

## 2022-11-24 NOTE — Progress Notes (Signed)
Daily Session Note  Patient Details  Name: Ricardo Zuniga MRN: [id number] Date of Birth: 1951/06/22 Referring Provider:   Flowsheet Row CARDIAC REHAB PHASE II ORIENTATION from 11/04/2022 in [facility] CARDIAC REHABILITATION  Referring Provider Dr. Delia Chimes       Encounter Date: 11/24/2022  Check In:  Session Check In - 11/24/22 0815       Check-In   Supervising physician immediately available to respond to emergencies [facility] MD immediately available    Physician(s) Dr. Diona Browner    Location [facility]    Staff Present Ross Ludwig, BS, Exercise Physiologist;Derel Mcglasson, RN;Debra Laural Benes, RN, Pleas Koch, RN, BSN    Virtual Visit No    Medication changes reported     No    Fall or balance concerns reported    No    Tobacco Cessation No Change    Warm-up and Cool-down Performed as group-led instruction    Resistance Training Performed Yes    VAD Patient? Yes    PAD/SET Patient? No      VAD patient   Has back up controller? Yes    Has spare charged batteries? Yes    Has battery cables? Yes    Has compatible battery clips? Yes      Pain Assessment   Currently in Pain? No/denies    Multiple Pain Sites No             Capillary Blood Glucose: No results found for this or any previous visit (from the past 24 hour(s)).    Social History   Tobacco Use  Smoking Status Former   Packs/day: 2.00   Years: 17.00   Additional pack years: 0.00   Total pack years: 34.00   Types: Cigarettes   Start date: 02/17/1968   Quit date: 02/28/1986   Years since quitting: 36.7   Passive exposure: Never  Smokeless Tobacco Never    Goals Met:  Independence with exercise equipment Exercise tolerated well No report of concerns or symptoms today Strength training completed today  Goals Unmet:  Not Applicable  Comments: check @ 9:15am   Dr. Dina Rich is Medical Director for [facility] Cardiac Rehab

## 2022-11-25 ENCOUNTER — Telehealth: Payer: Self-pay | Admitting: *Deleted

## 2022-11-26 ENCOUNTER — Encounter (HOSPITAL_COMMUNITY)
Admission: RE | Admit: 2022-11-26 | Discharge: 2022-11-26 | Disposition: A | Discharge status: Home / Self Care | Payer: Medicare PPO | Source: Ambulatory Visit | Attending: Cardiology | Admitting: Cardiology

## 2022-11-26 ENCOUNTER — Other Ambulatory Visit (HOSPITAL_COMMUNITY): Payer: Self-pay | Admitting: *Deleted

## 2022-11-26 DIAGNOSIS — I428 Other cardiomyopathies: Secondary | ICD-10-CM | POA: Diagnosis not present

## 2022-11-26 DIAGNOSIS — Z95811 Presence of heart assist device: Secondary | ICD-10-CM

## 2022-11-26 DIAGNOSIS — R55 Syncope and collapse: Secondary | ICD-10-CM | POA: Diagnosis not present

## 2022-11-26 DIAGNOSIS — I5022 Chronic systolic (congestive) heart failure: Secondary | ICD-10-CM | POA: Diagnosis not present

## 2022-11-26 DIAGNOSIS — R42 Dizziness and giddiness: Secondary | ICD-10-CM | POA: Diagnosis not present

## 2022-11-26 DIAGNOSIS — R5383 Other fatigue: Secondary | ICD-10-CM | POA: Diagnosis not present

## 2022-11-26 DIAGNOSIS — I2510 Atherosclerotic heart disease of native coronary artery without angina pectoris: Secondary | ICD-10-CM | POA: Diagnosis not present

## 2022-11-26 DIAGNOSIS — I5023 Acute on chronic systolic (congestive) heart failure: Secondary | ICD-10-CM

## 2022-11-26 DIAGNOSIS — Z79899 Other long term (current) drug therapy: Secondary | ICD-10-CM | POA: Diagnosis not present

## 2022-11-26 DIAGNOSIS — Z7901 Long term (current) use of anticoagulants: Secondary | ICD-10-CM

## 2022-11-26 DIAGNOSIS — E1140 Type 2 diabetes mellitus with diabetic neuropathy, unspecified: Secondary | ICD-10-CM | POA: Diagnosis not present

## 2022-11-26 NOTE — Progress Notes (Signed)
Daily Session Note  Patient Details  Name: Ricardo Zuniga MRN: [id number] Date of Birth: June 17, 1951 Referring Provider:   Flowsheet Row CARDIAC REHAB PHASE II ORIENTATION from 11/04/2022 in [facility] CARDIAC REHABILITATION  Referring Provider Dr. Delia Chimes       Encounter Date: 11/26/2022  Check In:  Session Check In - 11/26/22 0815       Check-In   Supervising physician immediately available to respond to emergencies [facility] MD immediately available    Physician(s) Dr. Diona Browner    Location [facility]    Staff Present Ross Ludwig, BS, Exercise Physiologist;Binyamin Nelis Laural Benes, RN, BSN;Hillary Troutman BSN, RN    Virtual Visit No    Medication changes reported     No    Fall or balance concerns reported    No    Tobacco Cessation No Change    Warm-up and Cool-down Performed as group-led Writer Performed Yes    VAD Patient? Yes    PAD/SET Patient? No      VAD patient   Has back up controller? Yes    Has spare charged batteries? Yes    Has battery cables? Yes    Has compatible battery clips? Yes      Pain Assessment   Currently in Pain? No/denies    Multiple Pain Sites No             Capillary Blood Glucose: No results found for this or any previous visit (from the past 24 hour(s)).    Social History   Tobacco Use  Smoking Status Former   Packs/day: 2.00   Years: 17.00   Additional pack years: 0.00   Total pack years: 34.00   Types: Cigarettes   Start date: 02/17/1968   Quit date: 02/28/1986   Years since quitting: 36.7   Passive exposure: Never  Smokeless Tobacco Never    Goals Met:  Independence with exercise equipment Exercise tolerated well No report of concerns or symptoms today Strength training completed today  Goals Unmet:  Not Applicable  Comments: Check out 915.   Dr. Dina Rich is Medical Director for [facility] Cardiac Rehab

## 2022-11-27 ENCOUNTER — Ambulatory Visit (HOSPITAL_COMMUNITY)
Admission: RE | Admit: 2022-11-27 | Discharge: 2022-11-27 | Disposition: A | Discharge status: Home / Self Care | Payer: Medicare PPO | Source: Ambulatory Visit | Attending: Cardiology | Admitting: Cardiology

## 2022-11-27 ENCOUNTER — Ambulatory Visit (HOSPITAL_COMMUNITY): Payer: Self-pay | Admitting: Pharmacist

## 2022-11-27 ENCOUNTER — Encounter (HOSPITAL_COMMUNITY): Payer: Medicare PPO

## 2022-11-27 DIAGNOSIS — E119 Type 2 diabetes mellitus without complications: Secondary | ICD-10-CM | POA: Diagnosis not present

## 2022-11-27 DIAGNOSIS — Z95811 Presence of heart assist device: Secondary | ICD-10-CM | POA: Insufficient documentation

## 2022-11-27 DIAGNOSIS — I2510 Atherosclerotic heart disease of native coronary artery without angina pectoris: Secondary | ICD-10-CM | POA: Diagnosis not present

## 2022-11-27 DIAGNOSIS — I340 Nonrheumatic mitral (valve) insufficiency: Secondary | ICD-10-CM | POA: Insufficient documentation

## 2022-11-27 DIAGNOSIS — I429 Cardiomyopathy, unspecified: Secondary | ICD-10-CM | POA: Diagnosis not present

## 2022-11-27 DIAGNOSIS — I509 Heart failure, unspecified: Secondary | ICD-10-CM | POA: Insufficient documentation

## 2022-11-27 DIAGNOSIS — Z7901 Long term (current) use of anticoagulants: Secondary | ICD-10-CM

## 2022-11-27 LAB — CBC
HCT: 40.9 % (ref 39.0–52.0)
Hemoglobin: 13.7 g/dL (ref 13.0–17.0)
MCH: 28.1 pg (ref 26.0–34.0)
MCHC: 33.5 g/dL (ref 30.0–36.0)
MCV: 83.8 fL (ref 80.0–100.0)
Platelets: 275 10*3/uL (ref 150–400)
RBC: 4.88 MIL/uL (ref 4.22–5.81)
RDW: 15 % (ref 11.5–15.5)
WBC: 6.4 10*3/uL (ref 4.0–10.5)
nRBC: 0 % (ref 0.0–0.2)

## 2022-11-27 LAB — ECHOCARDIOGRAM LIMITED

## 2022-11-27 LAB — BASIC METABOLIC PANEL
Anion gap: 11 (ref 5–15)
BUN: 17 mg/dL (ref 8–23)
CO2: 25 mmol/L (ref 22–32)
Calcium: 9.8 mg/dL (ref 8.9–10.3)
Chloride: 98 mmol/L (ref 98–111)
Creatinine, Ser: 0.88 mg/dL (ref 0.61–1.24)
GFR, Estimated: >60 mL/min (ref >60)
Glucose, Bld: 148 mg/dL — ABNORMAL HIGH (ref 70–99)
Potassium: 4.4 mmol/L (ref 3.5–5.1)
Sodium: 134 mmol/L — ABNORMAL LOW (ref 135–145)

## 2022-11-27 LAB — PROTIME-INR
INR: 2.7 — ABNORMAL HIGH (ref 0.8–1.2)
Prothrombin Time: 28.3 seconds — ABNORMAL HIGH (ref 11.4–15.2)

## 2022-11-27 LAB — DIGOXIN LEVEL: Digoxin Level: 0.6 ng/mL — ABNORMAL LOW (ref 0.8–2.0)

## 2022-11-27 LAB — LACTATE DEHYDROGENASE: LDH: 231 U/L — ABNORMAL HIGH (ref 98–192)

## 2022-11-27 NOTE — Progress Notes (Signed)
Speed  Flow  PI  Power  LVIDD  AI  Aortic opening MR  TR  Septum  RV  VTI (>18cm)  5500  4.2 5.3 4.3 5.5 none 1/5  None  none midline Mild/mod  12.5  5600  4.2 3.5 4.3 5.4 trace 1/5 none none midline Mild/mor 11.4  5700  4.3 3.5 4.3 5.4 trace 4/5 trace trace midline mod 13.7  5800  4.5 3.3 4.6 5.4 trace 4/5 trace trace Pulling in  mod 11.2                               Doppler MAP: 94 Auto cuff BP: 106/68 (89)  Park stitch over Aortic valve   Ramp ECHO performed at bedside per   At completion of ramp study, patients primary controller programmed:  Fixed speed: 5600 Low speed limit:5300    Carlton Adam RN, VAD Coordinator 24/7 pager [phone]

## 2022-11-27 NOTE — Progress Notes (Signed)
Patient presents for 3 week follow up in VAD Clinic today with RAMP echo with his wife Erie Noe. Reports no problems with VAD equipment or concerns with drive line.  Pt states he has been feeling great. Denies SOB, signs of bleeding or swelling.  Reports he has increased his physical activity and is able to walk 1.5 miles per day.   Pt tells me that since starting the Losartan he was very dizzy during the day. They started taking the Losartan at night. Pt notes that he was still having some lightheadness on and off during the day. Pts BP low today. Dr Gasper Lloyd stopped the Losartan today.  Pts wife Erie Noe is changing his driveline twice weekly. Dressing changed today in clinic. See documentation below.    Vital Signs:  Doppler Pressure 94 Automatc BP: 83/45 (60) HR: 98 SR w/ occ PVCs  SPO2: 97% %   Weight: 176.8 lb w/ eqt Last weight: 176.2 lb Home weights:  lbs this morning    VAD Indication: Destination Therapy for age   VAD interrogation & Equipment Management: Speed: 5600 Flow: 4.2 Power: 4.3 w    PI: 5.4   Alarms: none Events: 10 - 20  Fixed speed 5600 Low speed limit: 5300   Primary Controller:  Replace back up battery in 30 months. Back up controller:   Replace back up battery in 29 months.   Annual Equipment Maintenance on UBC/PM was performed on 10/07/22.    I reviewed the LVAD parameters from today and compared the results to the patient's prior recorded data. LVAD interrogation was NEGATIVE for significant power changes, NEGATIVE for clinical alarms and STABLE for PI events/speed drops. No programming changes were made and pump is functioning within specified parameters. Pt is performing daily controller and system monitor self tests along with completing weekly and monthly maintenance for LVAD equipment.   LVAD equipment check completed and is in good working order. Back-up equipment present. Charged back up battery and performed self-test on equipment.    Exit  Site Care: Drive line is being maintained twice weekly by his wife Erie Noe. Existing VAD dressing removed and site care performed using sterile technique. Drive line exit site cleaned with Chlora prep applicators x 2, allowed to dry, and gauze dressing withOUT Silver strip applied. Drive line exit site is healing and incorporated. The velour is fully implanted at exit site. Small scab present at exit site removed w/cleaning. No redness, scant serous drainage, no tenderness, rash, or foul odor noted. Anchor replaced over mepitel tape for skin protection. Pt denies fever or chills. Advance to weekly dressing changes using the daily kit. Pt has adequate dressing supplies at home. If no drainage at next visit, we will advance to the weekly kit.       Device: none   BP & Labs: MAP 94 - Doppler is reflecting modified systolic   Hgb 13.7 - No S/S of bleeding. Specifically denies melena/BRBPR or nosebleeds.   LDH stable at 231 with established baseline of 170 - 208. Denies tea-colored urine. No power elevations noted on interrogation.    Plan: Stop Losartan Stop Potassium Coumadin dosing per Lauren PharmD Recheck INR in 2 weeks at [facility] Return to clinic in 1 month for follow up with Dr Darlina Rumpf RN VAD Coordinator  Office: [phone]  24/7 Pager: [phone]

## 2022-11-27 NOTE — Progress Notes (Signed)
Echocardiogram 2D Echocardiogram has been performed.  Ricardo Zuniga 11/27/2022, 11:16 AM

## 2022-11-27 NOTE — Patient Instructions (Addendum)
  Stop Losartan Stop Potassium Coumadin dosing per Lauren PharmD Recheck INR in 2 weeks at [facility] Return to clinic in 1 month for follow up with Dr Gasper Lloyd

## 2022-11-28 ENCOUNTER — Encounter (HOSPITAL_COMMUNITY)
Admission: RE | Admit: 2022-11-28 | Discharge: 2022-11-28 | Disposition: A | Discharge status: Home / Self Care | Payer: Medicare PPO | Source: Ambulatory Visit | Attending: Cardiology | Admitting: Cardiology

## 2022-11-28 DIAGNOSIS — Z79899 Other long term (current) drug therapy: Secondary | ICD-10-CM | POA: Diagnosis not present

## 2022-11-28 DIAGNOSIS — Z95811 Presence of heart assist device: Secondary | ICD-10-CM | POA: Diagnosis not present

## 2022-11-28 DIAGNOSIS — R55 Syncope and collapse: Secondary | ICD-10-CM | POA: Diagnosis not present

## 2022-11-28 DIAGNOSIS — E1140 Type 2 diabetes mellitus with diabetic neuropathy, unspecified: Secondary | ICD-10-CM | POA: Diagnosis not present

## 2022-11-28 DIAGNOSIS — I5022 Chronic systolic (congestive) heart failure: Secondary | ICD-10-CM | POA: Diagnosis not present

## 2022-11-28 DIAGNOSIS — I2510 Atherosclerotic heart disease of native coronary artery without angina pectoris: Secondary | ICD-10-CM | POA: Diagnosis not present

## 2022-11-28 DIAGNOSIS — R42 Dizziness and giddiness: Secondary | ICD-10-CM | POA: Diagnosis not present

## 2022-11-28 DIAGNOSIS — I428 Other cardiomyopathies: Secondary | ICD-10-CM | POA: Diagnosis not present

## 2022-11-28 DIAGNOSIS — R5383 Other fatigue: Secondary | ICD-10-CM | POA: Diagnosis not present

## 2022-11-28 NOTE — Progress Notes (Signed)
Daily Session Note  Patient Details  Name: Ricardo Zuniga MRN: [id number] Date of Birth: October 31, 1950 Referring Provider:   Flowsheet Row CARDIAC REHAB PHASE II ORIENTATION from 11/04/2022 in [facility] CARDIAC REHABILITATION  Referring Provider Dr. Delia Chimes       Encounter Date: 11/28/2022  Check In:  Session Check In - 11/28/22 0815       Check-In   Supervising physician immediately available to respond to emergencies [facility] MD immediately available    Physician(s) Dr. Diona Browner    Location [facility]    Staff Present Ross Ludwig, BS, Exercise Physiologist;Other;Debra Laural Benes, RN, BSN    Virtual Visit No    Medication changes reported     Yes    Comments Stopped Losartan and Potassium (11/27/22)    Fall or balance concerns reported    No    Tobacco Cessation No Change    Warm-up and Cool-down Performed as group-led instruction    Resistance Training Performed Yes    VAD Patient? Yes    PAD/SET Patient? No      VAD patient   Has back up controller? Yes    Has spare charged batteries? Yes    Has battery cables? Yes    Has compatible battery clips? Yes      Pain Assessment   Currently in Pain? No/denies    Multiple Pain Sites No             Capillary Blood Glucose: Results for orders placed or performed during the hospital encounter of 11/27/22 (from the past 24 hour(s))  Digoxin level     Status: Abnormal   Collection Time: 11/27/22  9:53 AM  Result Value Ref Range   Digoxin Level 0.6 (L) 0.8 - 2.0 ng/mL  Lactate dehydrogenase     Status: Abnormal   Collection Time: 11/27/22  9:53 AM  Result Value Ref Range   LDH 231 (H) 98 - 192 U/L  Protime-INR     Status: Abnormal   Collection Time: 11/27/22  9:53 AM  Result Value Ref Range   Prothrombin Time 28.3 (H) 11.4 - 15.2 seconds   INR 2.7 (H) 0.8 - 1.2  CBC     Status: None   Collection Time: 11/27/22  9:53 AM  Result Value Ref Range   WBC 6.4 4.0 - 10.5 K/uL   RBC 4.88 4.22 - 5.81 MIL/uL    Hemoglobin 13.7 13.0 - 17.0 g/dL   HCT 78.2 95.6 - 21.3 %   MCV 83.8 80.0 - 100.0 fL   MCH 28.1 26.0 - 34.0 pg   MCHC 33.5 30.0 - 36.0 g/dL   RDW 08.6 57.8 - 46.9 %   Platelets 275 150 - 400 K/uL   nRBC 0.0 0.0 - 0.2 %  Basic metabolic panel     Status: Abnormal   Collection Time: 11/27/22  9:53 AM  Result Value Ref Range   Sodium 134 (L) 135 - 145 mmol/L   Potassium 4.4 3.5 - 5.1 mmol/L   Chloride 98 98 - 111 mmol/L   CO2 25 22 - 32 mmol/L   Glucose, Bld 148 (H) 70 - 99 mg/dL   BUN 17 8 - 23 mg/dL   Creatinine, Ser 6.29 0.61 - 1.24 mg/dL   Calcium 9.8 8.9 - 52.8 mg/dL   GFR, Estimated >41 >32 mL/min   Anion gap 11 5 - 15      Social History   Tobacco Use  Smoking Status Former   Packs/day: 2.00  Years: 17.00   Additional pack years: 0.00   Total pack years: 34.00   Types: Cigarettes   Start date: 02/17/1968   Quit date: 02/28/1986   Years since quitting: 36.7   Passive exposure: Never  Smokeless Tobacco Never    Goals Met:  Independence with exercise equipment Exercise tolerated well No report of concerns or symptoms today Strength training completed today  Goals Unmet:  Not Applicable  Comments: check out 0915   Dr. Dina Rich is Medical Director for [facility] Cardiac Rehab

## 2022-12-01 ENCOUNTER — Other Ambulatory Visit (HOSPITAL_COMMUNITY): Payer: Self-pay

## 2022-12-01 ENCOUNTER — Ambulatory Visit (HOSPITAL_COMMUNITY): Payer: Self-pay | Admitting: Pharmacist

## 2022-12-01 ENCOUNTER — Encounter (HOSPITAL_COMMUNITY)
Admission: RE | Admit: 2022-12-01 | Discharge: 2022-12-01 | Disposition: A | Discharge status: Home / Self Care | Payer: Medicare PPO | Source: Ambulatory Visit | Attending: Cardiology | Admitting: Cardiology

## 2022-12-01 ENCOUNTER — Encounter (HOSPITAL_COMMUNITY): Payer: Self-pay | Admitting: Cardiology

## 2022-12-01 ENCOUNTER — Ambulatory Visit (HOSPITAL_BASED_OUTPATIENT_CLINIC_OR_DEPARTMENT_OTHER)
Admission: RE | Admit: 2022-12-01 | Discharge: 2022-12-01 | Disposition: A | Discharge status: Home / Self Care | Payer: Medicare PPO | Source: Ambulatory Visit | Attending: Cardiology | Admitting: Cardiology

## 2022-12-01 VITALS — Wt 178.6 lb

## 2022-12-01 VITALS — Wt 176.8 lb

## 2022-12-01 DIAGNOSIS — R42 Dizziness and giddiness: Secondary | ICD-10-CM | POA: Insufficient documentation

## 2022-12-01 DIAGNOSIS — C9241 Acute promyelocytic leukemia, in remission: Secondary | ICD-10-CM | POA: Insufficient documentation

## 2022-12-01 DIAGNOSIS — E1140 Type 2 diabetes mellitus with diabetic neuropathy, unspecified: Secondary | ICD-10-CM | POA: Diagnosis not present

## 2022-12-01 DIAGNOSIS — Z95811 Presence of heart assist device: Secondary | ICD-10-CM

## 2022-12-01 DIAGNOSIS — R5383 Other fatigue: Secondary | ICD-10-CM | POA: Diagnosis not present

## 2022-12-01 DIAGNOSIS — I951 Orthostatic hypotension: Secondary | ICD-10-CM | POA: Diagnosis not present

## 2022-12-01 DIAGNOSIS — R55 Syncope and collapse: Secondary | ICD-10-CM | POA: Insufficient documentation

## 2022-12-01 DIAGNOSIS — I428 Other cardiomyopathies: Secondary | ICD-10-CM | POA: Insufficient documentation

## 2022-12-01 DIAGNOSIS — Z8679 Personal history of other diseases of the circulatory system: Secondary | ICD-10-CM | POA: Insufficient documentation

## 2022-12-01 DIAGNOSIS — Z7901 Long term (current) use of anticoagulants: Secondary | ICD-10-CM | POA: Diagnosis not present

## 2022-12-01 DIAGNOSIS — I5022 Chronic systolic (congestive) heart failure: Secondary | ICD-10-CM | POA: Insufficient documentation

## 2022-12-01 DIAGNOSIS — Z79899 Other long term (current) drug therapy: Secondary | ICD-10-CM | POA: Diagnosis not present

## 2022-12-01 DIAGNOSIS — I2510 Atherosclerotic heart disease of native coronary artery without angina pectoris: Secondary | ICD-10-CM | POA: Insufficient documentation

## 2022-12-01 DIAGNOSIS — I5023 Acute on chronic systolic (congestive) heart failure: Secondary | ICD-10-CM

## 2022-12-01 LAB — COMPREHENSIVE METABOLIC PANEL
ALT: 25 U/L (ref 0–44)
AST: 23 U/L (ref 15–41)
Albumin: 3.6 g/dL (ref 3.5–5.0)
Alkaline Phosphatase: 88 U/L (ref 38–126)
Anion gap: 13 (ref 5–15)
BUN: 17 mg/dL (ref 8–23)
CO2: 24 mmol/L (ref 22–32)
Calcium: 9.7 mg/dL (ref 8.9–10.3)
Chloride: 98 mmol/L (ref 98–111)
Creatinine, Ser: 0.91 mg/dL (ref 0.61–1.24)
GFR, Estimated: >60 mL/min (ref >60)
Glucose, Bld: 161 mg/dL — ABNORMAL HIGH (ref 70–99)
Potassium: 4 mmol/L (ref 3.5–5.1)
Sodium: 135 mmol/L (ref 135–145)
Total Bilirubin: 0.7 mg/dL (ref 0.3–1.2)
Total Protein: 8.1 g/dL (ref 6.5–8.1)

## 2022-12-01 LAB — CBC
HCT: 41.1 % (ref 39.0–52.0)
Hemoglobin: 13.5 g/dL (ref 13.0–17.0)
MCH: 27.7 pg (ref 26.0–34.0)
MCHC: 32.8 g/dL (ref 30.0–36.0)
MCV: 84.4 fL (ref 80.0–100.0)
Platelets: 261 10*3/uL (ref 150–400)
RBC: 4.87 MIL/uL (ref 4.22–5.81)
RDW: 15.2 % (ref 11.5–15.5)
WBC: 7 10*3/uL (ref 4.0–10.5)
nRBC: 0 % (ref 0.0–0.2)

## 2022-12-01 LAB — PROTIME-INR
INR: 2.4 — ABNORMAL HIGH (ref 0.8–1.2)
Prothrombin Time: 25.8 seconds — ABNORMAL HIGH (ref 11.4–15.2)

## 2022-12-01 LAB — DIGOXIN LEVEL: Digoxin Level: 0.5 ng/mL — ABNORMAL LOW (ref 0.8–2.0)

## 2022-12-01 LAB — GLUCOSE, CAPILLARY: Glucose-Capillary: 201 mg/dL — ABNORMAL HIGH (ref 70–99)

## 2022-12-01 LAB — LACTATE DEHYDROGENASE: LDH: 213 U/L — ABNORMAL HIGH (ref 98–192)

## 2022-12-01 MED ORDER — POTASSIUM CHLORIDE CRYS ER 20 MEQ PO TBCR: 20.0000 meq | EXTENDED_RELEASE_TABLET | Freq: Once | ORAL | 3 refills | Status: DC

## 2022-12-01 MED ORDER — POTASSIUM CHLORIDE CRYS ER 20 MEQ PO TBCR: 20.0000 meq | EXTENDED_RELEASE_TABLET | Freq: Every day | ORAL | 3 refills | Status: DC

## 2022-12-01 NOTE — Progress Notes (Signed)
Patient presents for a sick visit in VAD Clinic today with his wife Erie Noe. Reports no problems with VAD equipment or concerns with drive line.  Pt reports ongoing symptoms of dizziness and tunnel vision that started on Saturday. He states he was driving when episode occurred and had to pull over to let his wife drive. Symptoms occurred again this morning during cardiac rehab which led them to call VAD Clinic. He endorses a good appetite and is drinking adequate amount of fluid. Pt denies falls, shortness of breath, and signs of bleeding.   Seen by Dr. Gasper Lloyd in VAD Clinic today. Bedside echo performed and septum pulling in slightly. Speed reduced to 5500 per MD. Plan to stop Spironolactone and add of Potassium daily. VAD Coordinator to touch base with pt next week and if symptoms continue will set up for [facility]HC.   INR 2.4 today. Reported to Lauren [facility] plans for repeat at next visit. Pt instructed to continue current regimen of Coumadin 2.5mg  Tuesday/Saturday and  AOD.  Vital Signs:  Orthostatic VS for the past 72 hrs (Last 3 readings):  Orthostatic BP Patient Position BP Location Orthostatic Pulse  12/01/22 1153 (!) 104/0 -- -- --  12/01/22 1152 117/65 Standing Right Arm 75  12/01/22 1151 99/54 Sitting Right Arm 93  12/01/22 1150 133/68 Supine Right Arm 94  Doppler Pressure: 104 HR: 93SR w/ occ PACs  SPO2: 97% %   Weight: 176.8 lb w/ eqt Last weight: 176.2 lb w/ eqt  VAD Indication: Destination Therapy for age   VAD interrogation & Equipment Management: Speed: 5600 Flow: 3.9 Power: 4.4 w    PI: 5.3   Alarms: none Events: 2 PI events today; 14 events noted Saturday   Fixed speed 5500 Low speed limit: 5200   Primary Controller:  Replace back up battery in 30 months. Back up controller:   Replace back up battery in 29 months.   Annual Equipment Maintenance on UBC/PM was performed on 10/07/22.    I reviewed the LVAD parameters from today and compared the results to the  patient's prior recorded data. LVAD interrogation was NEGATIVE for significant power changes, NEGATIVE for clinical alarms and STABLE for PI events/speed drops. No programming changes were made and pump is functioning within specified parameters. Pt is performing daily controller and system monitor self tests along with completing weekly and monthly maintenance for LVAD equipment.   LVAD equipment check completed and is in good working order. Back-up equipment present. Charged back up battery and performed self-test on equipment.    Exit Site Care: Drive line is being maintained twice weekly by his wife Erie Noe. Existing VAD dressing CDI. Anchor secure. Continue twice weekly dressing changes. Will assess readiness for advancement next visit.  Device: none  BP & Labs: MAP 104 - Doppler is reflecting modified systolic   Hgb 13.5 - No S/S of bleeding. Specifically denies melena/BRBPR or nosebleeds.   LDH stable at 213 with established baseline of 170 - 208. Denies tea-colored urine. No power elevations noted on interrogation.   Cr .99 - stable    Plan: STOP Spironolactone  Start Potassium daily (1 tablet) Call VAD Clinic if symptoms continue next week Follow up in VAD Clinic 5/16 at 10:00am  Simmie Davies RN,BSN VAD Coordinator  Office: [phone]  24/7 Pager: [phone]

## 2022-12-01 NOTE — Progress Notes (Signed)
Daily Session Note  Patient Details  Name: Ricardo Zuniga MRN: [id number] Date of Birth: 1950-12-08 Referring Provider:   Flowsheet Row CARDIAC REHAB PHASE II ORIENTATION from 11/04/2022 in [facility] CARDIAC REHABILITATION  Referring Provider Dr. Delia Chimes       Encounter Date: 12/01/2022  Check In:  Session Check In - 12/01/22 0815       Check-In   Supervising physician immediately available to respond to emergencies [facility] MD immediately available    Physician(s) Dr. Jenene Slicker    Location [facility]    Staff Present Ross Ludwig, BS, Exercise Physiologist;Other    Virtual Visit No    Medication changes reported     No    Fall or balance concerns reported    No    Tobacco Cessation No Change    Warm-up and Cool-down Performed as group-led instruction    Resistance Training Performed Yes    VAD Patient? Yes    PAD/SET Patient? No      VAD patient   Has back up controller? Yes    Has spare charged batteries? Yes    Has battery cables? Yes    Has compatible battery clips? Yes      Pain Assessment   Currently in Pain? No/denies    Multiple Pain Sites No             Capillary Blood Glucose: No results found for this or any previous visit (from the past 24 hour(s)).    Social History   Tobacco Use  Smoking Status Former   Packs/day: 2.00   Years: 17.00   Additional pack years: 0.00   Total pack years: 34.00   Types: Cigarettes   Start date: 02/17/1968   Quit date: 02/28/1986   Years since quitting: 36.7   Passive exposure: Never  Smokeless Tobacco Never    Goals Met:  Independence with exercise equipment Exercise tolerated well No report of concerns or symptoms today Strength training completed today  Goals Unmet:  Not Applicable  Comments: check out 0915   Dr. Dina Rich is Medical Director for [facility] Cardiac Rehab

## 2022-12-01 NOTE — Patient Instructions (Addendum)
STOP Spironolactone  Start Potassium daily (1 tablet) Call VAD Clinic if symptoms continue next week Follow up in VAD Clinic 5/16 at 10:00am

## 2022-12-02 ENCOUNTER — Encounter (HOSPITAL_COMMUNITY): Payer: Self-pay | Admitting: Cardiology

## 2022-12-02 NOTE — Progress Notes (Signed)
ADVANCED HEART FAILURE CLINIC NOTE  Referring Physician: Ignatius Specking, MD  Primary Care: Ignatius Specking, MD Primary Cardiologist: Patric Dykes Heart Failure: Dorthula Nettles  HPI: Ricardo Zuniga is a very pleasant 72 YO WM w/ HFrEF 2/2 nonischemic cardiomyopathy, T2DM and hx of acute promyelocytic leukemia s/p ATRA ([facility] 2014) that was initially see at [facility] in October 2023 after presenting with a 1 week history of orthopnea, PND & LE edema. He had a TTE prior to admission w/ LVEF of 40-45%. Echo during admit was notable for LVEF of 30%-35%. LHC/RHC during admit w/ nonobstructive CAD & moderately reduced CI (2.1 L/min/m2). His cardiac history dates back to 2015 when he was found to have an LVEF of 25% felt to be triggered by endocarditis. He had improvement in LV function for several years, however, unfortunately had a fairly rapid decline in LVEF and functional status over the winter months of 2023. The decision was made to proceed with HMIII implantation. Ricardo Zuniga underwent successful implant on 09/24/22 as DT. His post-operative course was fairly unremarkable. He had rapid improvement in PA pressures after HMIII implantation.   Interval hx: During his last appointment we increased his speed by 100 RPM during ramp study that demonstrated adequate RV function and midline septal position.  Unfortunately over the last 2 to 3 days Ricardo Zuniga reports feeling very lightheaded with orthostatic symptoms.  He feels fatigued and has had multiple near syncopal spells.  Today he presents as a sick visit for further evaluation.  Past Medical History:  Diagnosis Date   Anal fissure    APML (acute promyelocytic leukemia) in remission    Completed treatment 04/2013   CHF (congestive heart failure)    Coronary artery disease    Nonobstructive at cardiac catheterization 09/2018   Difficult intubation    w/shoulder surgery at surgical center between 2005-2008   Gastroesophageal reflux  disease    History of blood transfusion 2014   History of kidney stones    passed stones   Hypercholesteremia    Neuropathy    bilateral feet   Nonischemic cardiomyopathy    a. EF 20% in 2015 - thought to possibly be viral induced or chemotherapy induced from treatments in 2014 b. at 35-40% by echo in 05/2016    Peptic ulcer disease    Pneumonia    Yrs ago   Type 2 diabetes mellitus    Ureteral colic    Vertigo    tx with valium prn   Wears glasses    Wears hearing aid in both ears     Current Outpatient Medications  Medication Sig Dispense Refill   acetaminophen (TYLENOL) 650 MG CR tablet Take 650-1,300 mg by mouth every 8 (eight) hours as needed for pain.     atorvastatin (LIPITOR) 40 MG tablet Take 1 tablet by mouth once daily 90 tablet 1   Cholecalciferol (VITAMIN D3) 125 MCG (5000 UT) TABS Take 5,000 Units by mouth in the morning.     Digoxin 62.5 MCG TABS Take 1 tablet by mouth daily. 30 tablet 5   docusate sodium (COLACE) 100 MG capsule Take 200 mg by mouth every evening.     empagliflozin (JARDIANCE) 10 MG TABS tablet Take 10 mg by mouth every evening.     furosemide (LASIX) 20 MG tablet Take 1 tablet (20 mg total) by mouth daily. 90 tablet 3   omeprazole (PRILOSEC) 20 MG capsule Take 20 mg by mouth daily before breakfast.  repaglinide (PRANDIN) 2 MG tablet Take 2-4 mg by mouth See admin instructions. Take 2 tablets (4 mg) by mouth in the morning & take 1 tablet (2 mg) by mouth in the evening.     traZODone (DESYREL) 100 MG tablet Take 1 tablet (100 mg total) by mouth at bedtime as needed for sleep. 30 tablet 3   warfarin (COUMADIN) 5 MG tablet Take 2.5 mg (1/2 tab) every Tuesday/Saturday and 5 mg (1 tab) all other days or as directed by HF Clinic 90 tablet 11   potassium chloride SA (KLOR-CON M) 20 MEQ tablet Take 1 tablet (20 mEq total) by mouth daily. 90 tablet 3   No current facility-administered medications for this encounter.    Allergies  Allergen Reactions    Zestril [Lisinopril] Cough      Social History   Socioeconomic History   Marital status: Married    Spouse name: Not on file   Number of children: Not on file   Years of education: Not on file   Highest education level: Not on file  Occupational History   Occupation: RETIRED    Comment: [name]   Occupation: SECURITY GUARD  Tobacco Use   Smoking status: Former    Packs/day: 2.00    Years: 17.00    Additional pack years: 0.00    Total pack years: 34.00    Types: Cigarettes    Start date: 02/17/1968    Quit date: 02/28/1986    Years since quitting: 36.7    Passive exposure: Never   Smokeless tobacco: Never  Vaping Use   Vaping Use: Never used  Substance and Sexual Activity   Alcohol use: No    Alcohol/week: 0.0 standard drinks of alcohol   Drug use: No   Sexual activity: Not Currently  Other Topics Concern   Not on file  Social History Narrative   Not on file   Social Determinants of Health   Financial Resource Strain: Low Risk  (09/03/2022)   Overall Financial Resource Strain (CARDIA)    Difficulty of Paying Living Expenses: Not hard at all  Food Insecurity: No Food Insecurity (09/23/2022)   Hunger Vital Sign    Worried About Running Out of Food in the Last Year: Never true    Ran Out of Food in the Last Year: Never true  Transportation Needs: No Transportation Needs (09/23/2022)   PRAPARE - Administrator, Civil Service (Medical): No    Lack of Transportation (Non-Medical): No  Physical Activity: Not on file  Stress: Not on file  Social Connections: Not on file  Intimate Partner Violence: Not At Risk (09/23/2022)   Humiliation, Afraid, Rape, and Kick questionnaire    Fear of Current or Ex-Partner: No    Emotionally Abused: No    Physically Abused: No    Sexually Abused: No      Family History  Problem Relation Age of Onset   Colon cancer Other     PHYSICAL EXAM: Vital Signs:  Doppler Pressure 112  Automatc BP: 120/72 (89) HR: 87   SPO2: 98%   Weight: 173.8 lb w/o eqt Last weight: 177.2 lb Home weights: 163 lbs this morning  VAD interrogation & Equipment Management: Speed: 5600 Flow: 3.9 Power: 4.4 w    PI: 5.3  General: sitting comfortably in NAD.  Neck: JVP 9 cm Lungs: Clear to auscultation bilaterally with normal respiratory effort. CV: Normal LVAD hum, minimally pulsatile radial pulses Abdomen: Soft, nontender, no hepatosplenomegaly, driveline w/o erythema  or induration. Skin: Intact without lesions or rashes.  Neurologic: Alert and oriented x 3.  Psych: Normal affect. Extremities: Warm to touch with no edema HEENT: Normal.   ECHO: - Echo (06/08/22): EF 30-35%, global HK, RV mildly reduced, mild MR   - Echo (7/23): EF 45%, RV normal   - Echo (8/22): EF 40-45%, RV normal   - Echo (7/21): EF 50%, RV normal  - LHC (09/2018): Moderate nonobstructive one-vessel coronary artery disease, prox RCA 40% stenosed.   - Echo (08/2018): EF 40%, RV normal  - Echo (05/2016): EF 35-40%, RV normal   - Echo (05/2014): EF 25-30%, RV mildly reduced  - Echo (05/2012): EF 50%, RV normal  - Echo (1/24): EF 25%, severe LV dilation, global hypokinesis, moderate RV dysfunction, mild RV enlargement, moderate AI, mild-moderate MR.  CATH: R/LHC (10/23): nonobstructive CAD RA 8 PA 52/25 (34 mean) PCWP 14 (mean) CO/CI (Fick): 4.3/2.1                                 PVR 4.6 WU          PAPi 3.4             RHC (1/24): RA mean 10 PA 69/28, mean 43 PCWP mean 20 CI 1.85 PVR 6.22 WU PAPi 4.1     ASSESSMENT & PLAN:  1. Chronic systolic heart failure s/p HMIII Destination therapy - Significant improvement in functional status since HMIII placement.  -Lab work unremarkable today -Continue digoxin 62.5 mcg daily, last dig level 0.5 -Today Ricardo Zuniga presents with complaints of lightheadedness, significant orthostatic symptoms, fatigue and multiple presyncopal events.  He did have a significant drop in blood pressure from  supine to sitting from 133 systolic to 99 while in clinic today.  Bedside ultrasound with preserved RV function, IVC 2.5 cm.  Will decrease speed by 100 RPM as it was prior to last week's changes.  Stop spironolactone.  Start potassium 20 mill equivalents daily.  If he continues to have symptoms plan for right heart cath next week to reassess RV function and left sided filling pressures.  2. Hx of atrial tach, PVCs & NSVT -Resolved on Zio patch.  Amiodarone discontinued  3. Hx of promyelocytic leukemia: S/P ATRA in 2014 at [facility].   4. Hx of endocarditis: 2015  I reviewed the LVAD parameters from today and compared the results to the patient's prior recorded data. LVAD interrogation was NEGATIVE for significant power changes, NEGATIVE for clinical alarms and STABLE for PI events/speed drops. No programming changes were made and pump is functioning within specified parameters. Pt is performing daily controller and system monitor self tests along with completing weekly and monthly maintenance for LVAD equipment.   Ricardo Zuniga 12/02/2022 8:41 AM

## 2022-12-03 ENCOUNTER — Encounter (HOSPITAL_COMMUNITY)
Admission: RE | Admit: 2022-12-03 | Discharge: 2022-12-03 | Disposition: A | Discharge status: Home / Self Care | Payer: Medicare PPO | Source: Ambulatory Visit | Attending: Cardiology | Admitting: Cardiology

## 2022-12-03 DIAGNOSIS — R55 Syncope and collapse: Secondary | ICD-10-CM | POA: Diagnosis not present

## 2022-12-03 DIAGNOSIS — R42 Dizziness and giddiness: Secondary | ICD-10-CM | POA: Diagnosis not present

## 2022-12-03 DIAGNOSIS — Z95811 Presence of heart assist device: Secondary | ICD-10-CM

## 2022-12-03 DIAGNOSIS — I428 Other cardiomyopathies: Secondary | ICD-10-CM | POA: Diagnosis not present

## 2022-12-03 DIAGNOSIS — R5383 Other fatigue: Secondary | ICD-10-CM | POA: Diagnosis not present

## 2022-12-03 DIAGNOSIS — Z79899 Other long term (current) drug therapy: Secondary | ICD-10-CM | POA: Diagnosis not present

## 2022-12-03 DIAGNOSIS — I2510 Atherosclerotic heart disease of native coronary artery without angina pectoris: Secondary | ICD-10-CM | POA: Diagnosis not present

## 2022-12-03 DIAGNOSIS — I5022 Chronic systolic (congestive) heart failure: Secondary | ICD-10-CM | POA: Diagnosis not present

## 2022-12-03 DIAGNOSIS — I5023 Acute on chronic systolic (congestive) heart failure: Secondary | ICD-10-CM

## 2022-12-03 DIAGNOSIS — E1140 Type 2 diabetes mellitus with diabetic neuropathy, unspecified: Secondary | ICD-10-CM | POA: Diagnosis not present

## 2022-12-03 NOTE — Progress Notes (Signed)
Cardiac Individual Treatment Plan  Patient Details  Name: Ricardo Zuniga MRN: [id number] Date of Birth: 04-28-1951 Referring Provider:   Flowsheet Row CARDIAC REHAB PHASE II ORIENTATION from 11/04/2022 in [facility] CARDIAC RE[facility]ABILITATION  Referring Provider Dr. Delia Chimes       Initial Encounter Date:  Flowsheet Row CARDIAC REHAB PHASE II ORIENTATION from 11/04/2022 in [place] CARDIAC REHABILITATION  Date 11/04/22       Visit Diagnosis: LVAD (left ventricular assist device) present  Acute on chronic systolic (congestive) heart failure  Patient's Home Medications on Admission:  Current Outpatient Medications:    acetaminophen (TYLENOL) 650 MG CR tablet, Take 650-1,300 mg by mouth every 8 (eight) hours as needed for pain., Disp: , Rfl:    atorvastatin (LIPITOR) 40 MG tablet, Take 1 tablet by mouth once daily, Disp: 90 tablet, Rfl: 1   Cholecalciferol (VITAMIN D3) 125 MCG (5000 UT) TABS, Take 5,000 Units by mouth in the morning., Disp: , Rfl:    Digoxin 62.5 MCG TABS, Take 1 tablet by mouth daily., Disp: 30 tablet, Rfl: 5   docusate sodium (COLACE) 100 MG capsule, Take 200 mg by mouth every evening., Disp: , Rfl:    empagliflozin (JARDIANCE) 10 MG TABS tablet, Take 10 mg by mouth every evening., Disp: , Rfl:    furosemide (LASIX) 20 MG tablet, Take 1 tablet (20 mg total) by mouth daily., Disp: 90 tablet, Rfl: 3   omeprazole (PRILOSEC) 20 MG capsule, Take 20 mg by mouth daily before breakfast., Disp: , Rfl:    potassium chloride SA (KLOR-CON M) 20 MEQ tablet, Take 1 tablet (20 mEq total) by mouth daily., Disp: 90 tablet, Rfl: 3   repaglinide (PRANDIN) 2 MG tablet, Take 2-4 mg by mouth See admin instructions. Take 2 tablets (4 mg) by mouth in the morning & take 1 tablet (2 mg) by mouth in the evening., Disp: , Rfl:    traZODone (DESYREL) 100 MG tablet, Take 1 tablet (100 mg total) by mouth at bedtime as needed for sleep., Disp: 30 tablet, Rfl: 3   warfarin (COUMADIN) 5 MG tablet, Take  2.5 mg (1/2 tab) every Tuesday/Saturday and 5 mg (1 tab) all other days or as directed by HF Clinic, Disp: 90 tablet, Rfl: 11  Past Medical History: Past Medical History:  Diagnosis Date   Anal fissure    APML (acute promyelocytic leukemia) in remission    Completed treatment 04/2013   CHF (congestive heart failure)    Coronary artery disease    Nonobstructive at cardiac catheterization 09/2018   Difficult intubation    w/shoulder surgery at surgical center between 2005-2008   Gastroesophageal reflux disease    History of blood transfusion 2014   History of kidney stones    passed stones   Hypercholesteremia    Neuropathy    bilateral feet   Nonischemic cardiomyopathy    a. EF 20% in 2015 - thought to possibly be viral induced or chemotherapy induced from treatments in 2014 b. at 35-40% by echo in 05/2016    Peptic ulcer disease    Pneumonia    Yrs ago   Type 2 diabetes mellitus    Ureteral colic    Vertigo    tx with valium prn   Wears glasses    Wears hearing aid in both ears     Tobacco Use: Social History   Tobacco Use  Smoking Status Former   Packs/day: 2.00   Years: 17.00   Additional pack years: 0.00  Total pack years: 34.00   Types: Cigarettes   Start date: 02/17/1968   Quit date: 02/28/1986   Years since quitting: 36.7   Passive exposure: Never  Smokeless Tobacco Never    Labs: Review Flowsheet  More data exists      Latest Ref Rng & Units 09/29/2022 09/30/2022 10/01/2022 10/02/2022 10/03/2022  Labs for ITP Cardiac and Pulmonary Rehab  O2 Saturation % 78.8  82.4  74.3  79.5  68.1  56  58     Capillary Blood Glucose: Lab Results  Component Value Date   GLUCAP 201 (H) 12/01/2022   GLUCAP 139 (H) 11/10/2022   GLUCAP 145 (H) 11/04/2022   GLUCAP 151 (H) 10/07/2022   GLUCAP 168 (H) 10/06/2022    POCT Glucose     Row Name 11/04/22 0944             POCT Blood Glucose   Pre-Exercise 145 mg/dL                Exercise Target Goals: Exercise  Program Goal: Individual exercise prescription set using results from initial 6 min walk test and THRR while considering  patient's activity barriers and safety.   Exercise Prescription Goal: Starting with aerobic activity 30 plus minutes a day, 3 days per week for initial exercise prescription. Provide home exercise prescription and guidelines that participant acknowledges understanding prior to discharge.  Activity Barriers & Risk Stratification:  Activity Barriers & Cardiac Risk Stratification - 11/04/22 0905       Activity Barriers & Cardiac Risk Stratification   Activity Barriers Arthritis;Joint Problems;Decreased Ventricular Function    Cardiac Risk Stratification High             6 Minute Walk:  6 Minute Walk     Row Name 11/04/22 1009         6 Minute Walk   Phase Initial     Distance 1100 feet     Walk Time 6 minutes     # of Rest Breaks 1     MPH 2.08     METS 2.39     RPE 12     VO2 Peak 8.36     Symptoms Yes (comment)     Comments one sitting break 1 minute due to fatigue     Resting HR 82 bpm     Resting BP --  78 MAP     Resting Oxygen Saturation  96 %     Exercise Oxygen Saturation  during 6 min walk 99 %     Max Ex. HR 111 bpm     Max Ex. BP --  90 MAP     2 Minute Post BP --  80 MAP              Oxygen Initial Assessment:   Oxygen Re-Evaluation:   Oxygen Discharge (Final Oxygen Re-Evaluation):   Initial Exercise Prescription:  Initial Exercise Prescription - 11/04/22 1000       Date of Initial Exercise RX and Referring Provider   Date 11/04/22    Referring Provider Dr. Delia Chimes    Expected Discharge Date 01/30/23      NuStep   Level 1    SPM 65    Minutes 39      Prescription Details   Frequency (times per week) 3    Duration Progress to 30 minutes of continuous aerobic without signs/symptoms of physical distress      Intensity   THRR 40-80% of Max Heartrate  16-109    Ratings of Perceived Exertion 11-13      Resistance  Training   Training Prescription Yes    Weight 2    Reps 10-15             Perform Capillary Blood Glucose checks as needed.  Exercise Prescription Changes:   Exercise Prescription Changes     Row Name 11/17/22 1000 12/01/22 1300           Response to Exercise   Blood Pressure (Admit) --  98 MAP --  80 MAP      Blood Pressure (Exercise) --  92 MAP --  78 MAP      Blood Pressure (Exit) --  76 MAP --  76 MAP      Heart Rate (Admit) 84 bpm 101 bpm      Heart Rate (Exercise) 107 bpm 125 bpm      Heart Rate (Exit) 90 bpm 93 bpm      Rating of Perceived Exertion (Exercise) 12 12      Duration Continue with 30 min of aerobic exercise without signs/symptoms of physical distress. Continue with 30 min of aerobic exercise without signs/symptoms of physical distress.      Intensity THRR unchanged THRR unchanged        Progression   Progression Continue to progress workloads to maintain intensity without signs/symptoms of physical distress. Continue to progress workloads to maintain intensity without signs/symptoms of physical distress.        Resistance Training   Training Prescription Yes Yes      Weight 4 4      Reps 10-15 10-15      Time 10 Minutes 10 Minutes        NuStep   Level 2 2      SPM 118 120      Minutes 39 39      METs 4.2 3.75               Exercise Comments:   Exercise Goals and Review:   Exercise Goals     Row Name 11/04/22 1012 12/01/22 1320           Exercise Goals   Increase Physical Activity Yes Yes      Intervention Develop an individualized exercise prescription for aerobic and resistive training based on initial evaluation findings, risk stratification, comorbidities and participant's personal goals.;Provide advice, education, support and counseling about physical activity/exercise needs. Develop an individualized exercise prescription for aerobic and resistive training based on initial evaluation findings, risk stratification,  comorbidities and participant's personal goals.;Provide advice, education, support and counseling about physical activity/exercise needs.      Expected Outcomes Short Term: Attend rehab on a regular basis to increase amount of physical activity.;Long Term: Add in home exercise to make exercise part of routine and to increase amount of physical activity.;Long Term: Exercising regularly at least 3-5 days a week. Short Term: Attend rehab on a regular basis to increase amount of physical activity.;Long Term: Add in home exercise to make exercise part of routine and to increase amount of physical activity.;Long Term: Exercising regularly at least 3-5 days a week.      Increase Strength and Stamina Yes Yes      Intervention Provide advice, education, support and counseling about physical activity/exercise needs.;Develop an individualized exercise prescription for aerobic and resistive training based on initial evaluation findings, risk stratification, comorbidities and participant's personal goals. Provide advice, education, support and counseling about physical activity/exercise needs.;Develop  an individualized exercise prescription for aerobic and resistive training based on initial evaluation findings, risk stratification, comorbidities and participant's personal goals.      Expected Outcomes Short Term: Increase workloads from initial exercise prescription for resistance, speed, and METs.;Short Term: Perform resistance training exercises routinely during rehab and add in resistance training at home;Long Term: Improve cardiorespiratory fitness, muscular endurance and strength as measured by increased METs and functional capacity ( ) Short Term: Increase workloads from initial exercise prescription for resistance, speed, and METs.;Short Term: Perform resistance training exercises routinely during rehab and add in resistance training at home;Long Term: Improve cardiorespiratory fitness, muscular endurance and  strength as measured by increased METs and functional capacity ( )      Able to understand and use rate of perceived exertion (RPE) scale Yes Yes      Intervention Provide education and explanation on how to use RPE scale Provide education and explanation on how to use RPE scale      Expected Outcomes Short Term: Able to use RPE daily in rehab to express subjective intensity level;Long Term:  Able to use RPE to guide intensity level when exercising independently Short Term: Able to use RPE daily in rehab to express subjective intensity level;Long Term:  Able to use RPE to guide intensity level when exercising independently      Knowledge and understanding of Target Heart Rate Range (THRR) Yes Yes      Intervention Provide education and explanation of THRR including how the numbers were predicted and where they are located for reference Provide education and explanation of THRR including how the numbers were predicted and where they are located for reference      Expected Outcomes Short Term: Able to state/look up THRR;Short Term: Able to use daily as guideline for intensity in rehab;Long Term: Able to use THRR to govern intensity when exercising independently Short Term: Able to state/look up THRR;Short Term: Able to use daily as guideline for intensity in rehab;Long Term: Able to use THRR to govern intensity when exercising independently      Able to check pulse independently Yes Yes      Intervention Provide education and demonstration on how to check pulse in carotid and radial arteries.;Review the importance of being able to check your own pulse for safety during independent exercise Provide education and demonstration on how to check pulse in carotid and radial arteries.;Review the importance of being able to check your own pulse for safety during independent exercise      Expected Outcomes Short Term: Able to explain why pulse checking is important during independent exercise;Long Term: Able to  check pulse independently and accurately Short Term: Able to explain why pulse checking is important during independent exercise;Long Term: Able to check pulse independently and accurately      Understanding of Exercise Prescription Yes Yes      Intervention Provide education, explanation, and written materials on patient's individual exercise prescription Provide education, explanation, and written materials on patient's individual exercise prescription      Expected Outcomes Short Term: Able to explain program exercise prescription;Long Term: Able to explain home exercise prescription to exercise independently Short Term: Able to explain program exercise prescription;Long Term: Able to explain home exercise prescription to exercise independently               Exercise Goals Re-Evaluation :  Exercise Goals Re-Evaluation     Row Name 12/01/22 1320             Exercise  Goal Re-Evaluation   Exercise Goals Review Increase Physical Activity;Increase Strength and Stamina;Able to understand and use rate of perceived exertion (RPE) scale;Able to check pulse independently;Knowledge and understanding of Target Heart Rate Range (THRR);Understanding of Exercise Prescription       Comments Pt has completed 11 sessions of cardiac rehab. He is very egar to be in the program and is progressing/ tolerating exerise well. He is currently exercising at 3.75 METs on the stepper. Will continue to monitor and progress as able.       Expected Outcomes Through exercise at rehab and home, the patient will meet their stated goals                 Discharge Exercise Prescription (Final Exercise Prescription Changes):  Exercise Prescription Changes - 12/01/22 1300       Response to Exercise   Blood Pressure (Admit) --   80 MAP   Blood Pressure (Exercise) --   78 MAP   Blood Pressure (Exit) --   76 MAP   Heart Rate (Admit) 101 bpm    Heart Rate (Exercise) 125 bpm    Heart Rate (Exit) 93 bpm    Rating of  Perceived Exertion (Exercise) 12    Duration Continue with 30 min of aerobic exercise without signs/symptoms of physical distress.    Intensity THRR unchanged      Progression   Progression Continue to progress workloads to maintain intensity without signs/symptoms of physical distress.      Resistance Training   Training Prescription Yes    Weight 4    Reps 10-15    Time 10 Minutes      NuStep   Level 2    SPM 120    Minutes 39    METs 3.75             Nutrition:  Target Goals: Understanding of nutrition guidelines, daily intake of sodium 1500mg , cholesterol 200mg , calories 30% from fat and 7% or less from saturated fats, daily to have 5 or more servings of fruits and vegetables.  Biometrics:  Pre Biometrics - 11/04/22 1013       Pre Biometrics   Height 5' 9.5" (1.765 m)    Weight 78.9 kg    Waist Circumference 36 inches    Hip Circumference 40 inches    Waist to Hip Ratio 0.9 %    BMI (Calculated) 25.33    Triceps Skinfold 10 mm    % Body Fat 23.2 %    Grip Strength 13.3 kg    Flexibility 0 in    Single Leg Stand 0 seconds              Nutrition Therapy Plan and Nutrition Goals:  Nutrition Therapy & Goals - 11/05/22 0722       Personal Nutrition Goals   Comments We provide educational sessions on heart heathy nutrition and assistance with RD referral if patient is interested.      Intervention Plan   Intervention Nutrition handout(s) given to patient.    Expected Outcomes Short Term Goal: Understand basic principles of dietary content, such as calories, fat, sodium, cholesterol and nutrients.             Nutrition Assessments:  Nutrition Assessments - 11/04/22 0914       MEDFICTS Scores   Pre Score 118            MEDIFICTS Score Key: ?70 Need to make dietary changes  40-70 Heart Healthy Diet ?  40 Therapeutic Level Cholesterol Diet   Picture Your Plate Scores: <60 Unhealthy dietary pattern with much room for  improvement. 41-50 Dietary pattern unlikely to meet recommendations for good health and room for improvement. 51-60 More healthful dietary pattern, with some room for improvement.  >60 Healthy dietary pattern, although there may be some specific behaviors that could be improved.    Nutrition Goals Re-Evaluation:   Nutrition Goals Discharge (Final Nutrition Goals Re-Evaluation):   Psychosocial: Target Goals: Acknowledge presence or absence of significant depression and/or stress, maximize coping skills, provide positive support system. Participant is able to verbalize types and ability to use techniques and skills needed for reducing stress and depression.  Initial Review & Psychosocial Screening:  Initial Psych Review & Screening - 11/04/22 0911       Initial Review   Current issues with Current Sleep Concerns      Family Dynamics   Good Support System? Yes    Comments His wife and his children are his main support system.      Barriers   Psychosocial barriers to participate in program There are no identifiable barriers or psychosocial needs.      Screening Interventions   Interventions Encouraged to exercise;Provide feedback about the scores to participant    Expected Outcomes Long Term goal: The participant improves quality of Life and PHQ9 Scores as seen by post scores and/or verbalization of changes;Short Term goal: Identification and review with participant of any Quality of Life or Depression concerns found by scoring the questionnaire.             Quality of Life Scores:  Quality of Life - 11/04/22 1013       Quality of Life   Select Quality of Life      Quality of Life Scores   Health/Function Pre 28.5 %    Socioeconomic Pre 28.21 %    Psych/Spiritual Pre 29.64 %    Family Pre 30 %    GLOBAL Pre 28.91 %            Scores of 19 and below usually indicate a poorer quality of life in these areas.  A difference of  2-3 points is a clinically meaningful  difference.  A difference of 2-3 points in the total score of the Quality of Life Index has been associated with significant improvement in overall quality of life, self-image, physical symptoms, and general health in studies assessing change in quality of life.  PHQ-9: Review Flowsheet       11/04/2022 09/03/2022  Depression screen PHQ 2/9  Decreased Interest 0 0  Down, Depressed, Hopeless 0 0  PHQ - 2 Score 0 0  Altered sleeping 0 -  Tired, decreased energy 1 -  Change in appetite 1 -  Feeling bad or failure about yourself  0 -  Trouble concentrating 0 -  Moving slowly or fidgety/restless 0 -  Suicidal thoughts 0 -  PHQ-9 Score 2 -  Difficult doing work/chores Not difficult at all -   Interpretation of Total Score  Total Score Depression Severity:  1-4 = Minimal depression, 5-9 = Mild depression, 10-14 = Moderate depression, 15-19 = Moderately severe depression, 20-27 = Severe depression   Psychosocial Evaluation and Intervention:  Psychosocial Evaluation - 11/04/22 0950       Psychosocial Evaluation & Interventions   Interventions Relaxation education;Stress management education;Encouraged to exercise with the program and follow exercise prescription    Comments Pt has no barriers to participate in CR. He has  no identifiable psychosocial issues. He does take Trazodone for sleep, and he reports that he was prescribed this previous to getting his LVAD. He states that he sleeps great at night. He scored a 2 on his PHQ-9 and this relates to his energy levels and his lack of appetite. His energy levels continue to improve as he recovers from his LVAD. His appetite is beginning to come back. He lost about 30 lbs while in the hospital during his LVAD implantation, but some of this was fluid weight. He states that he has been told by his doctors to try to put on weight. He has a very positive outlook and has adjusted well to his LVAD. He states that he has a good support system with his wife  and his children. His goals while in the program are to increase his strength and stamina and to return to his usual yard work. He is eager to start the program and motivated to improve himself.    Expected Outcomes Pt's sleep will continue to be managed with Trazodone, and he will continue to have no other identifialble psychosocial issues.    Continue Psychosocial Services  No Follow up required             Psychosocial Re-Evaluation:  Psychosocial Re-Evaluation     Row Name 11/26/22 1250             Psychosocial Re-Evaluation   Current issues with None Identified       Comments Patient is new to the program. He has completed 7 sessions. He continues to have no psychosocial barriers identified. His sleep continues to be managed with Trazadone. He enjoys the sessions and demonstrates an interest in improving his health. We will continue to monitor his progress.       Expected Outcomes Patient will continue to have no psychosocial barriers identified and his sleep will continue to be managed with Trazadone.       Interventions Stress management education;Relaxation education;Encouraged to attend Cardiac Rehabilitation for the exercise       Continue Psychosocial Services  No Follow up required                Psychosocial Discharge (Final Psychosocial Re-Evaluation):  Psychosocial Re-Evaluation - 11/26/22 1250       Psychosocial Re-Evaluation   Current issues with None Identified    Comments Patient is new to the program. He has completed 7 sessions. He continues to have no psychosocial barriers identified. His sleep continues to be managed with Trazadone. He enjoys the sessions and demonstrates an interest in improving his health. We will continue to monitor his progress.    Expected Outcomes Patient will continue to have no psychosocial barriers identified and his sleep will continue to be managed with Trazadone.    Interventions Stress management education;Relaxation  education;Encouraged to attend Cardiac Rehabilitation for the exercise    Continue Psychosocial Services  No Follow up required             Vocational Rehabilitation: Provide vocational rehab assistance to qualifying candidates.   Vocational Rehab Evaluation & Intervention:  Vocational Rehab - 11/04/22 0919       Initial Vocational Rehab Evaluation & Intervention   Assessment shows need for Vocational Rehabilitation No      Vocational Rehab Re-Evaulation   Comments retired             Education: Education Goals: Education classes will be provided on a weekly basis, covering required topics. Participant will state  understanding/return demonstration of topics presented.  Learning Barriers/Preferences:  Learning Barriers/Preferences - 11/04/22 0919       Learning Barriers/Preferences   Learning Barriers Hearing    Learning Preferences Audio;Pictoral;Written Material             Education Topics: Hypertension, Hypertension Reduction -Define heart disease and high blood pressure. Discus how high blood pressure affects the body and ways to reduce high blood pressure.   Exercise and Your Heart -Discuss why it is important to exercise, the FITT principles of exercise, normal and abnormal responses to exercise, and how to exercise safely. Flowsheet Row CARDIAC REHAB PHASE II EXERCISE from 12/03/2022 in [place] CARDIAC REHABILITATION  Date 11/26/22  Educator HB  Instruction Review Code 1- Verbalizes Understanding       Angina -Discuss definition of angina, causes of angina, treatment of angina, and how to decrease risk of having angina. Flowsheet Row CARDIAC REHAB PHASE II EXERCISE from 12/03/2022 in [place] CARDIAC REHABILITATION  Date 12/03/22  Educator HB  Instruction Review Code 2- Demonstrated Understanding       Cardiac Medications -Review what the following cardiac medications are used for, how they affect the body, and side effects that may  occur when taking the medications.  Medications include Aspirin, Beta blockers, calcium channel blockers, ACE Inhibitors, angiotensin receptor blockers, diuretics, digoxin, and antihyperlipidemics.   Congestive Heart Failure -Discuss the definition of CHF, how to live with CHF, the signs and symptoms of CHF, and how keep track of weight and sodium intake.   Heart Disease and Intimacy -Discus the effect sexual activity has on the heart, how changes occur during intimacy as we age, and safety during sexual activity.   Smoking Cessation / COPD -Discuss different methods to quit smoking, the health benefits of quitting smoking, and the definition of COPD.   Nutrition I: Fats -Discuss the types of cholesterol, what cholesterol does to the heart, and how cholesterol levels can be controlled.   Nutrition II: Labels -Discuss the different components of food labels and how to read food label   Heart Parts/Heart Disease and PAD -Discuss the anatomy of the heart, the pathway of blood circulation through the heart, and these are affected by heart disease. Flowsheet Row CARDIAC REHAB PHASE II EXERCISE from 12/03/2022 in [place] CARDIAC REHABILITATION  Date 11/19/22  Educator HB  Instruction Review Code 1- Verbalizes Understanding       Stress I: Signs and Symptoms -Discuss the causes of stress, how stress may lead to anxiety and depression, and ways to limit stress.   Stress II: Relaxation -Discuss different types of relaxation techniques to limit stress.   Warning Signs of Stroke / TIA -Discuss definition of a stroke, what the signs and symptoms are of a stroke, and how to identify when someone is having stroke. Flowsheet Row CARDIAC REHAB PHASE II EXERCISE from 12/03/2022 in [place] CARDIAC REHABILITATION  Date 11/12/22  Educator DF  Instruction Review Code 2- Demonstrated Understanding       Knowledge Questionnaire Score:  Knowledge Questionnaire Score - 11/04/22 0920        Knowledge Questionnaire Score   Pre Score 20/24             Core Components/Risk Factors/Patient Goals at Admission:  Personal Goals and Risk Factors at Admission - 11/04/22 0923       Core Components/Risk Factors/Patient Goals on Admission    Weight Management Yes;Weight Gain    Intervention Weight Management: Develop a combined nutrition and  exercise program designed to reach desired caloric intake, while maintaining appropriate intake of nutrient and fiber, sodium and fats, and appropriate energy expenditure required for the weight goal.;Weight Management: Provide education and appropriate resources to help participant work on and attain dietary goals.    Expected Outcomes Weight Gain: Understanding of general recommendations for a high calorie, high protein meal plan that promotes weight gain by distributing calorie intake throughout the day with the consumption for 4-5 meals, snacks, and/or supplements    Diabetes Yes    Intervention Provide education about signs/symptoms and action to take for hypo/hyperglycemia.;Provide education about proper nutrition, including hydration, and aerobic/resistive exercise prescription along with prescribed medications to achieve blood glucose in normal ranges: Fasting glucose 65-99 mg/dL    Expected Outcomes Short Term: Participant verbalizes understanding of the signs/symptoms and immediate care of hyper/hypoglycemia, proper foot care and importance of medication, aerobic/resistive exercise and nutrition plan for blood glucose control.;Long Term: Attainment of HbA1C < 7%.    Personal Goal Other Yes    Personal Goal Increase strength and stamina; return to doing his regular yard work    Intervention Attend CR three days per week and begin a home exercise program    Expected Outcomes Pt will meet stated goals.             Core Components/Risk Factors/Patient Goals Review:   Goals and Risk Factor Review     Row Name 11/26/22 1251              Core Components/Risk Factors/Patient Goals Review   Personal Goals Review Weight Management/Obesity;Diabetes;Other       Review Patient was referred to CR with acute on chronic HF and LVAD placement. He has multiple risk factors for CAD and is participating in the program for risk modification. He has completed 7 sessions with his current weight at 178.5 lbs up 4.5 lbs since his orientation visit. He is doing well in the program with consistent attendance and progressions. His DM is managed with Jardiance and Prandin. His last A1C on file was 09/24/22 at 7.0%. His dopplers readings are WNL. His personal goals for the program are to increase his strength and stamina and return to his usual hyard work and activities. We will continue to monitor his progress as he works towards meeting these goals.       Expected Outcomes Patient will complete the program meeting both personal and program goals.                Core Components/Risk Factors/Patient Goals at Discharge (Final Review):   Goals and Risk Factor Review - 11/26/22 1251       Core Components/Risk Factors/Patient Goals Review   Personal Goals Review Weight Management/Obesity;Diabetes;Other    Review Patient was referred to CR with acute on chronic HF and LVAD placement. He has multiple risk factors for CAD and is participating in the program for risk modification. He has completed 7 sessions with his current weight at 178.5 lbs up 4.5 lbs since his orientation visit. He is doing well in the program with consistent attendance and progressions. His DM is managed with Jard[facility] and Prandin. His last A1C on file was 09/24/22 at 7.0%. His dopplers readings are WNL. His personal goals for the program are to increase his strength and stamina and return to his usual hyard work and activities. We will continue to monitor his progress as he works towards meeting these goals.    Expected Outcomes Patient will complete the program meeting  both  personal and program goals.             ITP Comments:   Comments: ITP REVIEW Pt is making expected progress toward Cardiac Rehab goals after completing 12 sessions. Recommend continued exercise, life style modification, education, and increased stamina and strength.

## 2022-12-03 NOTE — Progress Notes (Signed)
Daily Session Note  Patient Details  Name: Ricardo Zuniga MRN: [id number] Date of Birth: 03/04/1951 Referring Provider:   Flowsheet Row CARDIAC REHAB PHASE II ORIENTATION from 11/04/2022 in [facility] CARDIAC REHABILITATION  Referring Provider Dr. Delia Chimes       Encounter Date: 12/03/2022  Check In:  Session Check In - 12/03/22 0815       Check-In   Supervising physician immediately available to respond to emergencies [facility] MD immediately available    Physician(s) Dr. Jenene Slicker    Location [facility]    Staff Present Ross Ludwig, BS, Exercise Physiologist;Debra Laural Benes, RN, BSN;Christy Edwards, RN, BSN    Virtual Visit No    Medication changes reported     Yes    Comments Stopped Spironolactone restarted potassium20 mill    Fall or balance concerns reported    No    Tobacco Cessation No Change    Warm-up and Cool-down Performed as group-led Writer Performed Yes    VAD Patient? Yes    PAD/SET Patient? No      VAD patient   Has back up controller? Yes    Has spare charged batteries? Yes    Has battery cables? Yes    Has compatible battery clips? Yes      Pain Assessment   Currently in Pain? No/denies    Multiple Pain Sites No             Capillary Blood Glucose: No results found for this or any previous visit (from the past 24 hour(s)).    Social History   Tobacco Use  Smoking Status Former   Packs/day: 2.00   Years: 17.00   Additional pack years: 0.00   Total pack years: 34.00   Types: Cigarettes   Start date: 02/17/1968   Quit date: 02/28/1986   Years since quitting: 36.7   Passive exposure: Never  Smokeless Tobacco Never    Goals Met:  Independence with exercise equipment Exercise tolerated well No report of concerns or symptoms today Strength training completed today  Goals Unmet:  Not Applicable  Comments: check out 0915   Dr. Dina Rich is Medical Director for [facility] Cardiac  Rehab

## 2022-12-04 ENCOUNTER — Other Ambulatory Visit (HOSPITAL_COMMUNITY): Payer: Self-pay | Admitting: Unknown Physician Specialty

## 2022-12-04 DIAGNOSIS — Z95811 Presence of heart assist device: Secondary | ICD-10-CM

## 2022-12-04 DIAGNOSIS — Z7901 Long term (current) use of anticoagulants: Secondary | ICD-10-CM

## 2022-12-05 ENCOUNTER — Encounter (HOSPITAL_COMMUNITY)
Admission: RE | Admit: 2022-12-05 | Discharge: 2022-12-05 | Disposition: A | Discharge status: Home / Self Care | Payer: Medicare PPO | Source: Ambulatory Visit | Attending: Cardiology | Admitting: Cardiology

## 2022-12-05 DIAGNOSIS — R55 Syncope and collapse: Secondary | ICD-10-CM | POA: Diagnosis not present

## 2022-12-05 DIAGNOSIS — R42 Dizziness and giddiness: Secondary | ICD-10-CM | POA: Diagnosis not present

## 2022-12-05 DIAGNOSIS — I5022 Chronic systolic (congestive) heart failure: Secondary | ICD-10-CM | POA: Diagnosis not present

## 2022-12-05 DIAGNOSIS — I5023 Acute on chronic systolic (congestive) heart failure: Secondary | ICD-10-CM

## 2022-12-05 DIAGNOSIS — Z95811 Presence of heart assist device: Secondary | ICD-10-CM | POA: Diagnosis not present

## 2022-12-05 DIAGNOSIS — I2510 Atherosclerotic heart disease of native coronary artery without angina pectoris: Secondary | ICD-10-CM | POA: Diagnosis not present

## 2022-12-05 DIAGNOSIS — I428 Other cardiomyopathies: Secondary | ICD-10-CM | POA: Diagnosis not present

## 2022-12-05 DIAGNOSIS — E1140 Type 2 diabetes mellitus with diabetic neuropathy, unspecified: Secondary | ICD-10-CM | POA: Diagnosis not present

## 2022-12-05 DIAGNOSIS — Z79899 Other long term (current) drug therapy: Secondary | ICD-10-CM | POA: Diagnosis not present

## 2022-12-05 DIAGNOSIS — R5383 Other fatigue: Secondary | ICD-10-CM | POA: Diagnosis not present

## 2022-12-05 NOTE — Progress Notes (Signed)
Daily Session Note  Patient Details  Name: Ricardo Zuniga MRN: [id number] Date of Birth: 1950-12-15 Referring Provider:   Flowsheet Row CARDIAC REHAB PHASE II ORIENTATION from 11/04/2022 in [facility] CARDIAC REHABILITATION  Referring Provider Dr. Delia Chimes       Encounter Date: 12/05/2022  Check In:  Session Check In - 12/05/22 0815       Check-In   Supervising physician immediately available to respond to emergencies [facility] MD immediately available    Physician(s) Dr. Jenene Slicker    Location [facility]    Staff Present Erskine Speed, RN;Debra Laural Benes, RN, Pleas Koch, RN, BSN;Hillary Troutman BSN, RN    Virtual Visit No    Fall or balance concerns reported    No    Tobacco Cessation No Change    Warm-up and Cool-down Performed as group-led Writer Performed Yes    VAD Patient? Yes    PAD/SET Patient? No      VAD patient   Has back up controller? Yes    Has spare charged batteries? Yes    Has battery cables? Yes    Has compatible battery clips? Yes             Capillary Blood Glucose: No results found for this or any previous visit (from the past 24 hour(s)).    Social History   Tobacco Use  Smoking Status Former   Packs/day: 2.00   Years: 17.00   Additional pack years: 0.00   Total pack years: 34.00   Types: Cigarettes   Start date: 02/17/1968   Quit date: 02/28/1986   Years since quitting: 36.7   Passive exposure: Never  Smokeless Tobacco Never    Goals Met:  Independence with exercise equipment Exercise tolerated well No report of concerns or symptoms today Strength training completed today  Goals Unmet:  Not Applicable  Comments: Checkout at 0915.   Dr. Dina Rich is Medical Director for [facility] Cardiac Rehab

## 2022-12-08 ENCOUNTER — Encounter (HOSPITAL_COMMUNITY): Payer: Self-pay

## 2022-12-08 ENCOUNTER — Encounter (HOSPITAL_COMMUNITY)
Admission: RE | Admit: 2022-12-08 | Discharge: 2022-12-08 | Disposition: A | Discharge status: Home / Self Care | Payer: Medicare PPO | Source: Ambulatory Visit | Attending: Cardiology | Admitting: Cardiology

## 2022-12-08 DIAGNOSIS — I5023 Acute on chronic systolic (congestive) heart failure: Secondary | ICD-10-CM

## 2022-12-08 DIAGNOSIS — I428 Other cardiomyopathies: Secondary | ICD-10-CM | POA: Diagnosis not present

## 2022-12-08 DIAGNOSIS — R55 Syncope and collapse: Secondary | ICD-10-CM | POA: Diagnosis not present

## 2022-12-08 DIAGNOSIS — R5383 Other fatigue: Secondary | ICD-10-CM | POA: Diagnosis not present

## 2022-12-08 DIAGNOSIS — I5022 Chronic systolic (congestive) heart failure: Secondary | ICD-10-CM | POA: Diagnosis not present

## 2022-12-08 DIAGNOSIS — R42 Dizziness and giddiness: Secondary | ICD-10-CM | POA: Diagnosis not present

## 2022-12-08 DIAGNOSIS — Z95811 Presence of heart assist device: Secondary | ICD-10-CM

## 2022-12-08 DIAGNOSIS — I2510 Atherosclerotic heart disease of native coronary artery without angina pectoris: Secondary | ICD-10-CM | POA: Diagnosis not present

## 2022-12-08 DIAGNOSIS — Z79899 Other long term (current) drug therapy: Secondary | ICD-10-CM | POA: Diagnosis not present

## 2022-12-08 DIAGNOSIS — E1140 Type 2 diabetes mellitus with diabetic neuropathy, unspecified: Secondary | ICD-10-CM | POA: Diagnosis not present

## 2022-12-08 NOTE — Progress Notes (Signed)
VAD Coordinator called pt to follow up in regards to last weeks visit. Pt states orthostatic symptoms have greatly improved and he has not had any near syncopal episodes since last weeks visit. Pt remains active in cardiac rehab and states he is tolerating exercising well. Pt advised to notify VAD team if symptoms reoccur. Pt verbalized understanding.  Simmie Davies RN, BSN VAD Coordinator 24/7 Pager [phone]

## 2022-12-08 NOTE — Progress Notes (Signed)
Daily Session Note  Patient Details  Name: Ricardo Zuniga MRN: [id number] Date of Birth: [birth year] Referring Provider:   Flowsheet Row CARDIAC REHAB PHASE II ORIENTATION from 11/04/2022 in [facility] CARDIAC REHABILITATION  Referring Provider Dr. Delia Chimes       Encounter Date: 12/08/2022  Check In:  Session Check In - 12/08/22 0821       Check-In   Supervising physician immediately available to respond to emergencies [facility] MD immediately available    Physician(s) Dr Wyline Mood    Location [facility]    Staff Present Rodena Medin, RN, Pleas Koch, RN, BSN    Virtual Visit No    Fall or balance concerns reported    No    Tobacco Cessation No Change    Warm-up and Cool-down Performed as group-led Writer Performed Yes    VAD Patient? Yes      VAD patient   Has back up controller? Yes    Has spare charged batteries? Yes    Has battery cables? Yes    Has compatible battery clips? Yes      Pain Assessment   Currently in Pain? No/denies    Multiple Pain Sites No             Capillary Blood Glucose: No results found for this or any previous visit (from the past 24 hour(s)).    Social History   Tobacco Use  Smoking Status Former   Packs/day: 2.00   Years: 17.00   Additional pack years: 0.00   Total pack years: 34.00   Types: Cigarettes   Start date: 02/17/1968   Quit date: 02/28/1986   Years since quitting: 36.8   Passive exposure: Never  Smokeless Tobacco Never    Goals Met:  Independence with exercise equipment Exercise tolerated well No report of concerns or symptoms today Strength training completed today  Goals Unmet:  Not Applicable  Comments: Checkout at 0915.   Dr. Dina Rich is Medical Director for [facility] Cardiac Rehab

## 2022-12-10 ENCOUNTER — Encounter (HOSPITAL_COMMUNITY)
Admission: RE | Admit: 2022-12-10 | Discharge: 2022-12-10 | Disposition: A | Discharge status: Home / Self Care | Payer: Medicare PPO | Source: Ambulatory Visit | Attending: Cardiology | Admitting: Cardiology

## 2022-12-10 DIAGNOSIS — Z95811 Presence of heart assist device: Secondary | ICD-10-CM | POA: Diagnosis not present

## 2022-12-10 DIAGNOSIS — I5042 Chronic combined systolic (congestive) and diastolic (congestive) heart failure: Secondary | ICD-10-CM | POA: Insufficient documentation

## 2022-12-10 DIAGNOSIS — I5023 Acute on chronic systolic (congestive) heart failure: Secondary | ICD-10-CM

## 2022-12-10 NOTE — Progress Notes (Signed)
Daily Session Note  Patient Details  Name: Ricardo Zuniga MRN: [id number] Date of Birth: 1951-01-16 Referring Provider:   Flowsheet Row CARDIAC REHAB PHASE II ORIENTATION from 11/04/2022 in [facility] CARDIAC REHABILITATION  Referring Provider Dr. Delia Chimes       Encounter Date: 12/10/2022  Check In:  Session Check In - 12/10/22 0815       Check-In   Supervising physician immediately available to respond to emergencies [facility] MD immediately available    Physician(s) Dr. Dina Rich    Location [facility]    Staff Present Rodena Medin, RN, BSN;Hillary Troutman BSN, RN;Heather Fredric Mare, BS, Exercise Physiologist    Virtual Visit No    Medication changes reported     No    Fall or balance concerns reported    No    Tobacco Cessation No Change    Warm-up and Cool-down Performed as group-led instruction    Resistance Training Performed Yes    VAD Patient? Yes      VAD patient   Has back up controller? Yes    Has spare charged batteries? Yes    Has battery cables? Yes    Has compatible battery clips? Yes      Pain Assessment   Currently in Pain? No/denies    Multiple Pain Sites No             Capillary Blood Glucose: No results found for this or any previous visit (from the past 24 hour(s)).    Social History   Tobacco Use  Smoking Status Former   Packs/day: 2.00   Years: 17.00   Additional pack years: 0.00   Total pack years: 34.00   Types: Cigarettes   Start date: 02/17/1968   Quit date: 02/28/1986   Years since quitting: 36.8   Passive exposure: Never  Smokeless Tobacco Never    Goals Met:  Independence with exercise equipment Exercise tolerated well No report of concerns or symptoms today Strength training completed today  Goals Unmet:  Not Applicable  Comments: Check out 915.   Dr. Dina Rich is Medical Director for [facility] Cardiac Rehab

## 2022-12-11 ENCOUNTER — Other Ambulatory Visit (HOSPITAL_COMMUNITY): Payer: Self-pay

## 2022-12-12 ENCOUNTER — Encounter (HOSPITAL_COMMUNITY)
Admission: RE | Admit: 2022-12-12 | Discharge: 2022-12-12 | Disposition: A | Discharge status: Home / Self Care | Payer: Medicare PPO | Source: Ambulatory Visit | Attending: Cardiology | Admitting: Cardiology

## 2022-12-12 DIAGNOSIS — I5023 Acute on chronic systolic (congestive) heart failure: Secondary | ICD-10-CM

## 2022-12-12 DIAGNOSIS — I5042 Chronic combined systolic (congestive) and diastolic (congestive) heart failure: Secondary | ICD-10-CM | POA: Diagnosis not present

## 2022-12-12 DIAGNOSIS — Z95811 Presence of heart assist device: Secondary | ICD-10-CM

## 2022-12-12 NOTE — Progress Notes (Signed)
Daily Session Note  Patient Details  Name: Ricardo Zuniga MRN: [id number] Date of Birth: 1951/05/29 Referring Provider:   Flowsheet Row CARDIAC REHAB PHASE II ORIENTATION from 11/04/2022 in [facility] CARDIAC REHABILITATION  Referring Provider Dr. Delia Chimes       Encounter Date: 12/12/2022  Check In:  Session Check In - 12/12/22 0815       Check-In   Supervising physician immediately available to respond to emergencies [facility] MD immediately available    Physician(s) Dr. Dina Rich    Location [facility]    Staff Present Ross Ludwig, BS, Exercise Physiologist;Idamay Hosein Laural Benes, RN, Pleas Koch, RN, BSN    Virtual Visit No    Medication changes reported     No    Fall or balance concerns reported    No    Tobacco Cessation No Change    Warm-up and Cool-down Performed as group-led instruction    Resistance Training Performed Yes    VAD Patient? Yes    PAD/SET Patient? No      VAD patient   Has back up controller? Yes    Has spare charged batteries? Yes    Has battery cables? Yes    Has compatible battery clips? Yes      Pain Assessment   Currently in Pain? No/denies    Multiple Pain Sites No             Capillary Blood Glucose: No results found for this or any previous visit (from the past 24 hour(s)).    Social History   Tobacco Use  Smoking Status Former   Packs/day: 2.00   Years: 17.00   Additional pack years: 0.00   Total pack years: 34.00   Types: Cigarettes   Start date: 02/17/1968   Quit date: 02/28/1986   Years since quitting: 36.8   Passive exposure: Never  Smokeless Tobacco Never    Goals Met:  Independence with exercise equipment Exercise tolerated well No report of concerns or symptoms today Strength training completed today  Goals Unmet:  Not Applicable  Comments: Check 915   Dr. Dina Rich is Medical Director for [facility] Cardiac Rehab

## 2022-12-15 ENCOUNTER — Encounter (HOSPITAL_COMMUNITY)
Admission: RE | Admit: 2022-12-15 | Discharge: 2022-12-15 | Disposition: A | Discharge status: Home / Self Care | Payer: Medicare PPO | Source: Ambulatory Visit | Attending: Cardiology | Admitting: Cardiology

## 2022-12-15 VITALS — Wt 176.8 lb

## 2022-12-15 DIAGNOSIS — I5023 Acute on chronic systolic (congestive) heart failure: Secondary | ICD-10-CM

## 2022-12-15 DIAGNOSIS — Z95811 Presence of heart assist device: Secondary | ICD-10-CM

## 2022-12-15 DIAGNOSIS — I5042 Chronic combined systolic (congestive) and diastolic (congestive) heart failure: Secondary | ICD-10-CM | POA: Diagnosis not present

## 2022-12-15 NOTE — Progress Notes (Signed)
Daily Session Note  Patient Details  Name: RAMIER BELLERIVE MRN: [id number] Date of Birth: 09-09-1950 Referring Provider:   Flowsheet Row CARDIAC REHAB PHASE II ORIENTATION from 11/04/2022 in [facility] CARDIAC REHABILITATION  Referring Provider Dr. Delia Chimes       Encounter Date: 12/15/2022  Check In:  Session Check In - 12/15/22 0815       Check-In   Supervising physician immediately available to respond to emergencies [facility] MD immediately available    Physician(s) Dr. Dietrich Pates    Location [facility]    Staff Present Ross Ludwig, BS, Exercise Physiologist;Phyllis Billingsley, RN;Ayce Pietrzyk Laural Benes, RN, BSN;Hillary Troutman BSN, RN    Virtual Visit No    Medication changes reported     No    Tobacco Cessation No Change    Warm-up and Cool-down Performed as group-led Writer Performed Yes    VAD Patient? Yes    PAD/SET Patient? No      VAD patient   Has back up controller? Yes    Has spare charged batteries? Yes    Has battery cables? Yes    Has compatible battery clips? Yes      Pain Assessment   Currently in Pain? No/denies    Multiple Pain Sites No             Capillary Blood Glucose: No results found for this or any previous visit (from the past 24 hour(s)).    Social History   Tobacco Use  Smoking Status Former   Packs/day: 2.00   Years: 17.00   Additional pack years: 0.00   Total pack years: 34.00   Types: Cigarettes   Start date: 02/17/1968   Quit date: 02/28/1986   Years since quitting: 36.8   Passive exposure: Never  Smokeless Tobacco Never    Goals Met:  Independence with exercise equipment Exercise tolerated well No report of concerns or symptoms today Strength training completed today  Goals Unmet:  Not Applicable  Comments: Check out 915.   Dr. Dina Rich is Medical Director for [facility] Cardiac Rehab

## 2022-12-17 ENCOUNTER — Encounter (HOSPITAL_COMMUNITY)
Admission: RE | Admit: 2022-12-17 | Discharge: 2022-12-17 | Disposition: A | Discharge status: Home / Self Care | Payer: Medicare PPO | Source: Ambulatory Visit | Attending: Cardiology | Admitting: Cardiology

## 2022-12-17 DIAGNOSIS — Z95811 Presence of heart assist device: Secondary | ICD-10-CM

## 2022-12-17 DIAGNOSIS — I5042 Chronic combined systolic (congestive) and diastolic (congestive) heart failure: Secondary | ICD-10-CM | POA: Diagnosis not present

## 2022-12-17 DIAGNOSIS — I5023 Acute on chronic systolic (congestive) heart failure: Secondary | ICD-10-CM

## 2022-12-17 NOTE — Progress Notes (Signed)
Daily Session Note  Patient Details  Name: Ricardo Zuniga MRN: [id number] Date of Birth: 29-Dec-1950 Referring Provider:   Flowsheet Row CARDIAC REHAB PHASE II ORIENTATION from 11/04/2022 in [facility] CARDIAC REHABILITATION  Referring Provider Dr. Delia Chimes       Encounter Date: 12/17/2022  Check In:  Session Check In - 12/17/22 0812       Check-In   Supervising physician immediately available to respond to emergencies [facility] MD immediately available    Physician(s) Dr. Rennis Golden    Location [facility]    Staff Present Avanell Shackleton BSN, RN;Ahley Bulls Laural Benes, RN, BSN;Heather Fredric Mare, BS, Exercise Physiologist    Virtual Visit No    Medication changes reported     No    Fall or balance concerns reported    No    Tobacco Cessation No Change    Warm-up and Cool-down Performed as group-led instruction    VAD Patient? No    PAD/SET Patient? No      VAD patient   Has back up controller? Yes    Has spare charged batteries? Yes    Has battery cables? Yes    Has compatible battery clips? Yes      Pain Assessment   Currently in Pain? No/denies    Multiple Pain Sites No             Capillary Blood Glucose: No results found for this or any previous visit (from the past 24 hour(s)).    Social History   Tobacco Use  Smoking Status Former   Packs/day: 2.00   Years: 17.00   Additional pack years: 0.00   Total pack years: 34.00   Types: Cigarettes   Start date: 02/17/1968   Quit date: 02/28/1986   Years since quitting: 36.8   Passive exposure: Never  Smokeless Tobacco Never    Goals Met:  Independence with exercise equipment Exercise tolerated well No report of concerns or symptoms today Strength training completed today  Goals Unmet:  Not Applicable  Comments: Check out 915.   Dr. Dina Rich is Medical Director for [facility] Cardiac Rehab

## 2022-12-19 ENCOUNTER — Encounter (HOSPITAL_COMMUNITY)
Admission: RE | Admit: 2022-12-19 | Discharge: 2022-12-19 | Disposition: A | Discharge status: Home / Self Care | Payer: Medicare PPO | Source: Ambulatory Visit | Attending: Cardiology | Admitting: Cardiology

## 2022-12-19 DIAGNOSIS — Z95811 Presence of heart assist device: Secondary | ICD-10-CM | POA: Diagnosis not present

## 2022-12-19 DIAGNOSIS — I5042 Chronic combined systolic (congestive) and diastolic (congestive) heart failure: Secondary | ICD-10-CM | POA: Diagnosis not present

## 2022-12-19 DIAGNOSIS — I5023 Acute on chronic systolic (congestive) heart failure: Secondary | ICD-10-CM

## 2022-12-19 NOTE — Progress Notes (Signed)
Daily Session Note  Patient Details  Name: Ricardo Zuniga MRN: [id number] Date of Birth: 29-Sep-1950 Referring Provider:   Flowsheet Row CARDIAC REHAB PHASE II ORIENTATION from 11/04/2022 in [facility] CARDIAC REHABILITATION  Referring Provider Dr. Delia Chimes       Encounter Date: 12/19/2022  Check In:  Session Check In - 12/19/22 0815       Check-In   Supervising physician immediately available to respond to emergencies [facility] MD immediately available    Physician(s) Dr. Diona Browner    Location [facility]    Staff Present Rodena Medin, RN, Pleas Koch, RN, BSN;Heather Gaynell Face, Exercise Physiologist;Hillary Troutman BSN, RN    Virtual Visit No    Medication changes reported     No    Tobacco Cessation No Change    Warm-up and Cool-down Performed as group-led Writer Performed Yes    VAD Patient? Yes    PAD/SET Patient? No      VAD patient   Has back up controller? Yes    Has spare charged batteries? Yes    Has battery cables? Yes    Has compatible battery clips? Yes      Pain Assessment   Currently in Pain? No/denies    Multiple Pain Sites No             Capillary Blood Glucose: No results found for this or any previous visit (from the past 24 hour(s)).    Social History   Tobacco Use  Smoking Status Former   Packs/day: 2.00   Years: 17.00   Additional pack years: 0.00   Total pack years: 34.00   Types: Cigarettes   Start date: 02/17/1968   Quit date: 02/28/1986   Years since quitting: 36.8   Passive exposure: Never  Smokeless Tobacco Never    Goals Met:  Independence with exercise equipment Exercise tolerated well No report of concerns or symptoms today Strength training completed today  Goals Unmet:  Not Applicable  Comments: Check out 915.   Dr. Dina Rich is Medical Director for [facility] Cardiac Rehab

## 2022-12-22 ENCOUNTER — Encounter (HOSPITAL_COMMUNITY)
Admission: RE | Admit: 2022-12-22 | Discharge: 2022-12-22 | Disposition: A | Discharge status: Home / Self Care | Payer: Medicare PPO | Source: Ambulatory Visit | Attending: Cardiology | Admitting: Cardiology

## 2022-12-22 DIAGNOSIS — Z95811 Presence of heart assist device: Secondary | ICD-10-CM

## 2022-12-22 DIAGNOSIS — I5042 Chronic combined systolic (congestive) and diastolic (congestive) heart failure: Secondary | ICD-10-CM | POA: Diagnosis not present

## 2022-12-22 DIAGNOSIS — I5023 Acute on chronic systolic (congestive) heart failure: Secondary | ICD-10-CM

## 2022-12-22 NOTE — Progress Notes (Signed)
Daily Session Note  Patient Details  Name: Ricardo Zuniga MRN: [id number] Date of Birth: 07-Jun-1951 Referring Provider:   Flowsheet Row CARDIAC REHAB PHASE II ORIENTATION from 11/04/2022 in [facility] CARDIAC REHABILITATION  Referring Provider Dr. Delia Chimes       Encounter Date: 12/22/2022  Check In:  Session Check In - 12/22/22 0815       Check-In   Supervising physician immediately available to respond to emergencies [facility] MD immediately available    Physician(s) Dr. Jenene Slicker    Location [facility]    Staff Present Ross Ludwig, BS, Exercise Physiologist;Dalton Debbe Mounts, MS, ACSM-CEP;Osiel Stick Laural Benes, RN, BSN    Virtual Visit No    Medication changes reported     No    Fall or balance concerns reported    No    Tobacco Cessation No Change    Warm-up and Cool-down Performed as group-led instruction    Resistance Training Performed Yes    VAD Patient? Yes    PAD/SET Patient? No      VAD patient   Has back up controller? Yes    Has spare charged batteries? Yes    Has battery cables? Yes    Has compatible battery clips? Yes      Pain Assessment   Currently in Pain? No/denies    Multiple Pain Sites No             Capillary Blood Glucose: No results found for this or any previous visit (from the past 24 hour(s)).    Social History   Tobacco Use  Smoking Status Former   Packs/day: 2.00   Years: 17.00   Additional pack years: 0.00   Total pack years: 34.00   Types: Cigarettes   Start date: 02/17/1968   Quit date: 02/28/1986   Years since quitting: 36.8   Passive exposure: Never  Smokeless Tobacco Never    Goals Met:  Independence with exercise equipment Exercise tolerated well No report of concerns or symptoms today Strength training completed today  Goals Unmet:  Not Applicable  Comments: Check out 915.   Dr. Dina Rich is Medical Director for [facility] Cardiac Rehab

## 2022-12-23 ENCOUNTER — Other Ambulatory Visit (HOSPITAL_COMMUNITY): Payer: Self-pay

## 2022-12-23 DIAGNOSIS — Z7901 Long term (current) use of anticoagulants: Secondary | ICD-10-CM

## 2022-12-23 DIAGNOSIS — Z95811 Presence of heart assist device: Secondary | ICD-10-CM

## 2022-12-24 ENCOUNTER — Encounter (HOSPITAL_COMMUNITY)
Admission: RE | Admit: 2022-12-24 | Discharge: 2022-12-24 | Disposition: A | Discharge status: Home / Self Care | Payer: Medicare PPO | Source: Ambulatory Visit | Attending: Cardiology | Admitting: Cardiology

## 2022-12-24 DIAGNOSIS — Z95811 Presence of heart assist device: Secondary | ICD-10-CM

## 2022-12-24 DIAGNOSIS — I5042 Chronic combined systolic (congestive) and diastolic (congestive) heart failure: Secondary | ICD-10-CM | POA: Diagnosis not present

## 2022-12-24 DIAGNOSIS — I5023 Acute on chronic systolic (congestive) heart failure: Secondary | ICD-10-CM

## 2022-12-24 NOTE — Progress Notes (Signed)
Daily Session Note  Patient Details  Name: Ricardo Zuniga MRN: [id number] Date of Birth: 04-21-1951 Referring Provider:   Flowsheet Row CARDIAC REHAB PHASE II ORIENTATION from 11/04/2022 in [facility] CARDIAC REHABILITATION  Referring Provider Dr. Delia Chimes       Encounter Date: 12/24/2022  Check In:  Session Check In - 12/24/22 0815       Check-In   Supervising physician immediately available to respond to emergencies [facility] MD immediately available    Physician(s) Dr. Jenene Slicker    Location [facility]    Staff Present Ross Ludwig, BS, Exercise Physiologist;Dalton Debbe Mounts, MS, ACSM-CEP    Virtual Visit No    Medication changes reported     No    Fall or balance concerns reported    No    Tobacco Cessation No Change    Warm-up and Cool-down Performed as group-led instruction    Resistance Training Performed Yes    VAD Patient? Yes    PAD/SET Patient? No      VAD patient   Has back up controller? Yes    Has spare charged batteries? Yes    Has battery cables? Yes    Has compatible battery clips? Yes      Pain Assessment   Currently in Pain? No/denies    Multiple Pain Sites No             Capillary Blood Glucose: No results found for this or any previous visit (from the past 24 hour(s)).    Social History   Tobacco Use  Smoking Status Former   Packs/day: 2.00   Years: 17.00   Additional pack years: 0.00   Total pack years: 34.00   Types: Cigarettes   Start date: 02/17/1968   Quit date: 02/28/1986   Years since quitting: 36.8   Passive exposure: Never  Smokeless Tobacco Never    Goals Met:  Independence with exercise equipment Exercise tolerated well No report of concerns or symptoms today Strength training completed today  Goals Unmet:  Not Applicable  Comments: check out 0915   Dr. Dina Rich is Medical Director for [facility] Cardiac Rehab

## 2022-12-25 ENCOUNTER — Ambulatory Visit (HOSPITAL_COMMUNITY)
Admission: RE | Admit: 2022-12-25 | Discharge: 2022-12-25 | Disposition: A | Discharge status: Home / Self Care | Payer: Medicare PPO | Source: Ambulatory Visit | Attending: Cardiology | Admitting: Cardiology

## 2022-12-25 ENCOUNTER — Ambulatory Visit (HOSPITAL_COMMUNITY): Payer: Self-pay | Admitting: Pharmacist

## 2022-12-25 ENCOUNTER — Encounter (HOSPITAL_COMMUNITY): Payer: Self-pay | Admitting: Cardiology

## 2022-12-25 ENCOUNTER — Other Ambulatory Visit (HOSPITAL_COMMUNITY): Payer: Self-pay

## 2022-12-25 VITALS — BP 127/92 | HR 104 | Wt 178.0 lb

## 2022-12-25 DIAGNOSIS — Z7901 Long term (current) use of anticoagulants: Secondary | ICD-10-CM

## 2022-12-25 DIAGNOSIS — Z95811 Presence of heart assist device: Secondary | ICD-10-CM

## 2022-12-25 DIAGNOSIS — I428 Other cardiomyopathies: Secondary | ICD-10-CM | POA: Diagnosis not present

## 2022-12-25 DIAGNOSIS — C9241 Acute promyelocytic leukemia, in remission: Secondary | ICD-10-CM | POA: Diagnosis not present

## 2022-12-25 DIAGNOSIS — R42 Dizziness and giddiness: Secondary | ICD-10-CM | POA: Diagnosis not present

## 2022-12-25 DIAGNOSIS — I38 Endocarditis, valve unspecified: Secondary | ICD-10-CM | POA: Insufficient documentation

## 2022-12-25 DIAGNOSIS — Z79899 Other long term (current) drug therapy: Secondary | ICD-10-CM | POA: Diagnosis not present

## 2022-12-25 DIAGNOSIS — I2510 Atherosclerotic heart disease of native coronary artery without angina pectoris: Secondary | ICD-10-CM | POA: Diagnosis not present

## 2022-12-25 DIAGNOSIS — I5022 Chronic systolic (congestive) heart failure: Secondary | ICD-10-CM | POA: Diagnosis not present

## 2022-12-25 DIAGNOSIS — E1140 Type 2 diabetes mellitus with diabetic neuropathy, unspecified: Secondary | ICD-10-CM | POA: Diagnosis not present

## 2022-12-25 LAB — LACTATE DEHYDROGENASE: LDH: 236 U/L — ABNORMAL HIGH (ref 98–192)

## 2022-12-25 LAB — COMPREHENSIVE METABOLIC PANEL
ALT: 32 U/L (ref 0–44)
AST: 29 U/L (ref 15–41)
Albumin: 3.7 g/dL (ref 3.5–5.0)
Alkaline Phosphatase: 91 U/L (ref 38–126)
Anion gap: 14 (ref 5–15)
BUN: 16 mg/dL (ref 8–23)
CO2: 23 mmol/L (ref 22–32)
Calcium: 9.5 mg/dL (ref 8.9–10.3)
Chloride: 97 mmol/L — ABNORMAL LOW (ref 98–111)
Creatinine, Ser: 0.96 mg/dL (ref 0.61–1.24)
GFR, Estimated: >60 mL/min (ref >60)
Glucose, Bld: 211 mg/dL — ABNORMAL HIGH (ref 70–99)
Potassium: 4.5 mmol/L (ref 3.5–5.1)
Sodium: 134 mmol/L — ABNORMAL LOW (ref 135–145)
Total Bilirubin: 0.6 mg/dL (ref 0.3–1.2)
Total Protein: 7.9 g/dL (ref 6.5–8.1)

## 2022-12-25 LAB — CBC
HCT: 43.1 % (ref 39.0–52.0)
Hemoglobin: 14 g/dL (ref 13.0–17.0)
MCH: 27 pg (ref 26.0–34.0)
MCHC: 32.5 g/dL (ref 30.0–36.0)
MCV: 83 fL (ref 80.0–100.0)
Platelets: 239 10*3/uL (ref 150–400)
RBC: 5.19 MIL/uL (ref 4.22–5.81)
RDW: 15.8 % — ABNORMAL HIGH (ref 11.5–15.5)
WBC: 6 10*3/uL (ref 4.0–10.5)
nRBC: 0 % (ref 0.0–0.2)

## 2022-12-25 LAB — PROTIME-INR
INR: 1.9 — ABNORMAL HIGH (ref 0.8–1.2)
Prothrombin Time: 21.9 seconds — ABNORMAL HIGH (ref 11.4–15.2)

## 2022-12-25 LAB — PREALBUMIN: Prealbumin: 26 mg/dL (ref 18–38)

## 2022-12-25 LAB — DIGOXIN LEVEL: Digoxin Level: 0.5 ng/mL — ABNORMAL LOW (ref 0.8–2.0)

## 2022-12-25 MED ORDER — FUROSEMIDE 20 MG PO TABS
20.0000 mg | ORAL_TABLET | ORAL | 3 refills | Status: DC
Start: 2022-12-25 — End: 2023-02-10

## 2022-12-25 NOTE — Progress Notes (Signed)
ADVANCED HEART FAILURE CLINIC NOTE  Referring Physician: Ignatius Specking, MD  Primary Care: Ricardo Specking, MD Primary Cardiologist: Ricardo Zuniga Heart Failure: Ricardo Zuniga  HPI: Ricardo Zuniga is a very pleasant 72 YO WM w/ HFrEF 2/2 nonischemic cardiomyopathy, T2DM and hx of acute promyelocytic leukemia s/p ATRA ([facility] 2014) that was initially see at [facility] in October 2023 after presenting with a 1 week history of orthopnea, PND & LE edema. He had a TTE prior to admission w/ LVEF of 40-45%. Echo during admit was notable for LVEF of 30%-35%. LHC/RHC during admit w/ nonobstructive CAD & moderately reduced CI (2.1 L/min/m2). His cardiac history dates back to 2015 when he was found to have an LVEF of 25% felt to be triggered by endocarditis. He had improvement in LV function for several years, however, unfortunately had a fairly rapid decline in LVEF and functional status over the winter months of 2023. The decision was made to proceed with HMIII implantation. Ricardo Zuniga underwent successful implant on 2/73/24 as DT. His post-operative course was fairly unremarkable. He had rapid improvement in PA pressures after HMIII implantation.   Interval hx: Since his last appointment Ricardo Zuniga has been doing remarkably well.  At this time he has no functional limitations with continued improvement in 6-minute walk.  His only complaints are continued orthostatic symptoms that appear to resolve after drinking Gatorade.  Otherwise no issues with his driveline site; still has some pain at sternotomy.  Past Medical History:  Diagnosis Date   Anal fissure    APML (acute promyelocytic leukemia) in remission (HCC)    Completed treatment 04/2013   CHF (congestive heart failure) (HCC)    Coronary artery disease    Nonobstructive at cardiac catheterization 09/2018   Difficult intubation    w/shoulder surgery at surgical center between 2005-2008   Gastroesophageal reflux disease    History  of blood transfusion 2014   History of kidney stones    passed stones   Hypercholesteremia    Neuropathy    bilateral feet   Nonischemic cardiomyopathy (HCC)    a. EF 20% in 2015 - thought to possibly be viral induced or chemotherapy induced from treatments in 2014 b. at 35-40% by echo in 05/2016    Peptic ulcer disease    Pneumonia    Yrs ago   Type 2 diabetes mellitus (HCC)    Ureteral colic    Vertigo    tx with valium prn   Wears glasses    Wears hearing aid in both ears     Current Outpatient Medications  Medication Sig Dispense Refill   acetaminophen (TYLENOL) 650 MG CR tablet Take 650-1,300 mg by mouth every 8 (eight) hours as needed for pain.     atorvastatin (LIPITOR) 40 MG tablet Take 1 tablet by mouth once daily 90 tablet 1   Cholecalciferol (VITAMIN D3) 125 MCG (5000 UT) TABS Take 5,000 Units by mouth in the morning.     Digoxin 62.5 MCG TABS Take 1 tablet by mouth daily. 30 tablet 5   docusate sodium (COLACE) 100 MG capsule Take 200 mg by mouth every evening.     empagliflozin (JARDIANCE) 10 MG TABS tablet Take 10 mg by mouth every evening.     omeprazole (PRILOSEC) 20 MG capsule Take 20 mg by mouth daily before breakfast.     potassium chloride SA (KLOR-CON M) 20 MEQ tablet Take 1 tablet (20 mEq total) by mouth daily. 90 tablet 3  repaglinide (PRANDIN) 2 MG tablet Take 2-4 mg by mouth See admin instructions. Take 2 tablets (4 mg) by mouth in the morning & take 1 tablet (2 mg) by mouth in the evening.     traZODone (DESYREL) 100 MG tablet Take 1 tablet (100 mg total) by mouth at bedtime as needed for sleep. 30 tablet 3   warfarin (COUMADIN) 5 MG tablet Take 2.5 mg (1/2 tab) every Tuesday/Saturday and 5 mg (1 tab) all other days or as directed by HF Clinic 90 tablet 11   furosemide (LASIX) 20 MG tablet Take 1 tablet (20 mg total) by mouth every other day. 90 tablet 3   No current facility-administered medications for this encounter.    Allergies  Allergen Reactions    Zestril [Lisinopril] Cough      Social History   Socioeconomic History   Marital status: Married    Spouse name: Not on file   Number of children: Not on file   Years of education: Not on file   Highest education level: Not on file  Occupational History   Occupation: RETIRED    Comment: [name]   Occupation: SECURITY GUARD  Tobacco Use   Smoking status: Former    Packs/day: 2.00    Years: 17.00    Additional pack years: 0.00    Total pack years: 34.00    Types: Cigarettes    Start date: 02/17/1968    Quit date: 02/28/1986    Years since quitting: 36.8    Passive exposure: Never   Smokeless tobacco: Never  Vaping Use   Vaping Use: Never used  Substance and Sexual Activity   Alcohol use: No    Alcohol/week: 0.0 standard drinks of alcohol   Drug use: No   Sexual activity: Not Currently  Other Topics Concern   Not on file  Social History Narrative   Not on file   Social Determinants of Health   Financial Resource Strain: Low Risk  (09/03/2022)   Overall Financial Resource Strain (CARDIA)    Difficulty of Paying Living Expenses: Not hard at all  Food Insecurity: No Food Insecurity (09/23/2022)   Hunger Vital Sign    Worried About Running Out of Food in the Last Year: Never true    Ran Out of Food in the Last Year: Never true  Transportation Needs: No Transportation Needs (09/23/2022)   PRAPARE - Administrator, Civil Service (Medical): No    Lack of Transportation (Non-Medical): No  Physical Activity: Not on file  Stress: Not on file  Social Connections: Not on file  Intimate Partner Violence: Not At Risk (09/23/2022)   Humiliation, Afraid, Rape, and Kick questionnaire    Fear of Current or Ex-Partner: No    Emotionally Abused: No    Physically Abused: No    Sexually Abused: No      Family History  Problem Relation Age of Onset   Colon cancer Other     PHYSICAL EXAM: Vital Signs:  Doppler Pressure: 100 HR: 97 SR w/ occ PACs  SPO2:  94% %   Weight: 178 lb w/ eqt Last weight: 176.8 lb w/ eqt   VAD Indication: Destination Therapy for age   VAD interrogation & Equipment Management: Speed: 5500 Flow: 4.0 Power: 4.2 w    PI: 4.5   Alarms: none Events: 10-20 PI events  General: sitting comfortably in NAD.  Neck: JVP less than 7 cm Lungs: Clear to auscultation bilaterally with normal respiratory effort. CV: Normal  LVAD hum, minimally pulsatile radial pulses Abdomen: Soft, nontender, no hepatosplenomegaly, driveline w/o erythema or induration. Skin: Intact without lesions or rashes.  Neurologic: Alert and oriented x 3.  Psych: Normal affect. Extremities: Warm to touch with no edema HEENT: Normal.   ECHO: - Echo (06/08/22): EF 30-35%, global HK, RV mildly reduced, mild MR   - Echo (7/23): EF 45%, RV normal   - Echo (8/22): EF 40-45%, RV normal   - Echo (7/21): EF 50%, RV normal  - LHC (09/2018): Moderate nonobstructive one-vessel coronary artery disease, prox RCA 40% stenosed.   - Echo (08/2018): EF 40%, RV normal  - Echo (05/2016): EF 35-40%, RV normal   - Echo (05/2014): EF 25-30%, RV mildly reduced  - Echo (05/2012): EF 50%, RV normal  - Echo (1/24): EF 25%, severe LV dilation, global hypokinesis, moderate RV dysfunction, mild RV enlargement, moderate AI, mild-moderate MR. - Echo (11/27/22): HMIII LVAD in place. Ramp study from 5500RPM-5800RPM; trivial MR throughout. Septum midline. RV enlarged but with preserved function.   CATH: R/LHC (10/23): nonobstructive CAD RA 8 PA 52/25 (34 mean) PCWP 14 (mean) CO/CI (Fick): 4.3/2.1                                 PVR 4.6 WU          PAPi 3.4             RHC (1/24): RA mean 10 PA 69/28, mean 43 PCWP mean 20 CI 1.85 PVR 6.22 WU PAPi 4.1     ASSESSMENT & PLAN:  1. Chronic systolic heart failure s/p HMIII Destination therapy - Significant improvement in functional status since HMIII placement.  -Lab work pending -Continue digoxin 62.5 mcg daily, last  dig level 0.5 -Bedside ultrasound today with IVC of 2.2 to 2.3 cm, collapsible with inspiration, interventricular septum midline and preserved RV function.  He continues to have frequent lightheadedness likely due to hypovolemia.  Will decrease Lasix to 20 mg every other day. -continue jardiance 10mg  daily.   2. Hx of atrial tach, PVCs & NSVT -Resolved on Zio patch.  Amiodarone discontinued  3. Hx of promyelocytic leukemia: S/P ATRA in 2014 at [facility].   4. Hx of endocarditis: 2015  I reviewed the LVAD parameters from today and compared the results to the patient's prior recorded data. LVAD interrogation was NEGATIVE for significant power changes, NEGATIVE for clinical alarms and STABLE for PI events/speed drops. No programming changes were made and pump is functioning within specified parameters. Pt is performing daily controller and system monitor self tests along with completing weekly and monthly maintenance for LVAD equipment.   Briggett Tuccillo 12/25/2022 12:18 PM

## 2022-12-25 NOTE — Progress Notes (Addendum)
Patient presents for a 1 month f/u visit and 3 month Intermacs in VAD Clinic today with his wife Ricardo Zuniga. Reports no problems with VAD equipment or concerns with drive line.  Pt reports ongoing symptoms of lightheadedness/dizziness. Pt recently seen for sick visit 4/22 for dizziness and tunnel vision by Dr. Gasper Lloyd. Bedside echo performed and septum pulling in slightly this visit. Speed reduced to 5500 per MD and Spironolactone stopped and of Potassium daily added at this visit.   Pt states he initially felt better after last visit; however, symptoms have started to reoccur. Pt wife states symptoms seem to worsen with exercise and when pt does not eat. Pt has been drinking Gatorade Zero and states it has helped alleviate his symptoms. Denies falls, shortness of breath, and signs of bleeding.   Plan to change Lasix to every other day and call VAD Clinic if symptoms continue.   Patient advanced to weekly dressing changes. Will assess readiness to shower at next appointment.  INR 1.9 today. Reported to Ricardo Zuniga plans for repeat at [facility] 01/12/23. Pt instructed to continue current regimen of Coumadin 2.5mg  Tuesday/Saturday and 5mg  AOD.  Vital Signs:  Doppler Pressure: 100 HR: 97 SR w/ occ PACs  SPO2: 94% %   Weight: 178 lb w/ eqt Last weight: 176.8 lb w/ eqt  VAD Indication: Destination Therapy for age   VAD interrogation & Equipment Management: Speed: 5500 Flow: 4.0 Power: 4.2 w    PI: 4.5   Alarms: none Events: 10-20 PI events  Fixed speed 5500 Low speed limit: 5200   Primary Controller:  Replace back up battery in 29 months. Back up controller:   Replace back up battery in 28 months.   Annual Equipment Maintenance on UBC/PM was performed on 10/07/22.    I reviewed the LVAD parameters from today and compared the results to the patient's prior recorded data. LVAD interrogation was NEGATIVE for significant power changes, NEGATIVE for clinical alarms and STABLE for PI  events/speed drops. No programming changes were made and pump is functioning within specified parameters. Pt is performing daily controller and system monitor self tests along with completing weekly and monthly maintenance for LVAD equipment.   LVAD equipment check completed and is in good working order. Back-up equipment present. Charged back up battery and performed self-test on equipment.  Patient completed 1400 feet during 6 minute walk test. Tolerated well.   Neurocognitive trail making completed correctly in 1 m 57 s.   [address] Cardiomyopathy, EQ-5D-3L and post-VAD QOL completed by the patient independently.    [place] Cardiomyopathy Questionnaire     12/25/2022    3:08 PM  KCCQ-12  1 a. Ability to shower/bathe Slightly limited  1 b. Ability to walk 1 block Not at all limited  1 c. Ability to hurry/jog Other, Did not do  2. Edema feet/ankles/legs Less than once a week  3. Limited by fatigue Never over the past 2 weeks  4. Limited by dyspnea Never over the past 2 weeks  5. Sitting up / on 3+ pillows Never over the past 2 weeks  6. Limited enjoyment of life Not limited at all  7. Rest of life w/ symptoms Completely satisfied  8 a. Participation in hobbies Slightly limited  8 b. Participation in chores Did not limit at all  8 c. Visiting family/friends Did not limit at all     Exit Site Care: Existing VAD dressing removed and site care performed using sterile technique. Drive line exit  site cleaned with Chlora prep applicators x 2, allowed to dry, and Sorbaview dressing with Silverlon patch applied. Exit site healed and incorporated, the velour is fully implanted at exit site. No redness, tenderness, drainage, foul odor or rash noted. Drive line anchor re-applied. Pt denies fever or chills. Wife Ricardo Zuniga observed weekly dressing change. Pt given 8 weekly dressing kits for home use. Will address readiness to shower next appointment.  Device: none  BP & Labs: MAP 100 - Doppler  is reflecting MAP   Hgb 14.0 - No S/S of bleeding. Specifically denies melena/BRBPR or nosebleeds.   LDH stable at 236 with established baseline of 170 - 208. Denies tea-colored urine. No power elevations noted on interrogation.   Cr .96 - stable    Plan: Change to Lasix to 20mg  every other day Return to VAD Clinic in 6 weeks Call VAD Clinic if dizziness continues  Ricardo Davies RN,BSN VAD Coordinator  Office: [phone]  24/7 Pager: [phone]

## 2022-12-25 NOTE — Patient Instructions (Addendum)
Change to Lasix to 20mg  every other day Return to VAD Clinic in 6 weeks Call VAD Clinic if dizziness continues

## 2022-12-26 ENCOUNTER — Encounter (HOSPITAL_COMMUNITY)
Admission: RE | Admit: 2022-12-26 | Discharge: 2022-12-26 | Disposition: A | Discharge status: Home / Self Care | Payer: Medicare PPO | Source: Ambulatory Visit | Attending: Cardiology | Admitting: Cardiology

## 2022-12-26 DIAGNOSIS — I5023 Acute on chronic systolic (congestive) heart failure: Secondary | ICD-10-CM

## 2022-12-26 DIAGNOSIS — Z95811 Presence of heart assist device: Secondary | ICD-10-CM

## 2022-12-26 DIAGNOSIS — I5042 Chronic combined systolic (congestive) and diastolic (congestive) heart failure: Secondary | ICD-10-CM | POA: Diagnosis not present

## 2022-12-26 NOTE — Progress Notes (Signed)
Daily Session Note  Patient Details  Name: Ricardo Zuniga MRN: [id number] Date of Birth: 1950-08-14 Referring Provider:   Flowsheet Row CARDIAC REHAB PHASE II ORIENTATION from 11/04/2022 in [facility] CARDIAC REHABILITATION  Referring Provider Dr. Delia Chimes       Encounter Date: 12/26/2022  Check In:  Session Check In - 12/26/22 0813       Check-In   Supervising physician immediately available to respond to emergencies [facility] MD immediately available    Physician(s) Dr. Jenene Slicker    Location [facility]    Staff Present Cristopher Peru MHA, MS, ACSM-CEP;Miria Cappelli Laural Benes, RN, Pleas Koch, RN, BSN    Virtual Visit No    Medication changes reported     No    Fall or balance concerns reported    No    Tobacco Cessation No Change    Warm-up and Cool-down Performed as group-led instruction    Resistance Training Performed Yes    VAD Patient? Yes    PAD/SET Patient? No      VAD patient   Has back up controller? Yes    Has spare charged batteries? Yes    Has battery cables? Yes    Has compatible battery clips? Yes      Pain Assessment   Currently in Pain? No/denies    Multiple Pain Sites No             Capillary Blood Glucose: Results for orders placed or performed during the hospital encounter of 12/25/22 (from the past 24 hour(s))  Protime-INR     Status: Abnormal   Collection Time: 12/25/22 10:02 AM  Result Value Ref Range   Prothrombin Time 21.9 (H) 11.4 - 15.2 seconds   INR 1.9 (H) 0.8 - 1.2  Prealbumin     Status: None   Collection Time: 12/25/22 10:02 AM  Result Value Ref Range   Prealbumin 26 18 - 38 mg/dL  Comprehensive metabolic panel     Status: Abnormal   Collection Time: 12/25/22 10:02 AM  Result Value Ref Range   Sodium 134 (L) 135 - 145 mmol/L   Potassium 4.5 3.5 - 5.1 mmol/L   Chloride 97 (L) 98 - 111 mmol/L   CO2 23 22 - 32 mmol/L   Glucose, Bld 211 (H) 70 - 99 mg/dL   BUN 16 8 - 23 mg/dL   Creatinine, Ser 7.82 0.61 - 1.24  mg/dL   Calcium 9.5 8.9 - 95.6 mg/dL   Total Protein 7.9 6.5 - 8.1 g/dL   Albumin 3.7 3.5 - 5.0 g/dL   AST 29 15 - 41 U/L   ALT 32 0 - 44 U/L   Alkaline Phosphatase 91 38 - 126 U/L   Total Bilirubin 0.6 0.3 - 1.2 mg/dL   GFR, Estimated >21 >30 mL/min   Anion gap 14 5 - 15  Lactate dehydrogenase     Status: Abnormal   Collection Time: 12/25/22 10:02 AM  Result Value Ref Range   LDH 236 (H) 98 - 192 U/L  Digoxin level     Status: Abnormal   Collection Time: 12/25/22 10:02 AM  Result Value Ref Range   Digoxin Level 0.5 (L) 0.8 - 2.0 ng/mL  CBC     Status: Abnormal   Collection Time: 12/25/22 10:02 AM  Result Value Ref Range   WBC 6.0 4.0 - 10.5 K/uL   RBC 5.19 4.22 - 5.81 MIL/uL   Hemoglobin 14.0 13.0 - 17.0 g/dL   HCT 86.5 78.4 - 69.6 %  MCV 83.0 80.0 - 100.0 fL   MCH 27.0 26.0 - 34.0 pg   MCHC 32.5 30.0 - 36.0 g/dL   RDW 62.1 (H) 30.8 - 65.7 %   Platelets 239 150 - 400 K/uL   nRBC 0.0 0.0 - 0.2 %      Social History   Tobacco Use  Smoking Status Former   Packs/day: 2.00   Years: 17.00   Additional pack years: 0.00   Total pack years: 34.00   Types: Cigarettes   Start date: 02/17/1968   Quit date: 02/28/1986   Years since quitting: 36.8   Passive exposure: Never  Smokeless Tobacco Never    Goals Met:  Independence with exercise equipment Exercise tolerated well No report of concerns or symptoms today Strength training completed today  Goals Unmet:  Not Applicable  Comments: Check out 915.   Dr. Dina Rich is Medical Director for [facility] Cardiac Rehab

## 2022-12-29 ENCOUNTER — Encounter (HOSPITAL_COMMUNITY)
Admission: RE | Admit: 2022-12-29 | Discharge: 2022-12-29 | Disposition: A | Discharge status: Home / Self Care | Payer: Medicare PPO | Source: Ambulatory Visit | Attending: Cardiology | Admitting: Cardiology

## 2022-12-29 VITALS — Wt 179.2 lb

## 2022-12-29 DIAGNOSIS — I5042 Chronic combined systolic (congestive) and diastolic (congestive) heart failure: Secondary | ICD-10-CM | POA: Diagnosis not present

## 2022-12-29 DIAGNOSIS — Z95811 Presence of heart assist device: Secondary | ICD-10-CM

## 2022-12-29 DIAGNOSIS — I5023 Acute on chronic systolic (congestive) heart failure: Secondary | ICD-10-CM

## 2022-12-29 NOTE — Progress Notes (Signed)
Daily Session Note  Patient Details  Name: Ricardo Zuniga MRN: [id number] Date of Birth: [birth year] Referring Provider:   Flowsheet Row CARDIAC REHAB PHASE II ORIENTATION from 11/04/2022 in [facility] CARDIAC REHABILITATION  Referring Provider Dr. Delia Chimes       Encounter Date: 12/29/2022  Check In:  Session Check In - 12/29/22 0815       Check-In   Supervising physician immediately available to respond to emergencies [facility] MD immediately available    Physician(s) Dr. Wyline Mood    Location [facility]    Staff Present Cristopher Peru MHA, MS, ACSM-CEP;Staci Righter, RN, BSN;Heather Fredric Mare, BS, Exercise Physiologist    Virtual Visit No    Medication changes reported     No    Fall or balance concerns reported    No    Tobacco Cessation No Change    Warm-up and Cool-down Performed as group-led instruction    Resistance Training Performed Yes    VAD Patient? Yes    PAD/SET Patient? No      VAD patient   Has back up controller? Yes    Has spare charged batteries? Yes    Has battery cables? Yes    Has compatible battery clips? Yes      Pain Assessment   Currently in Pain? No/denies    Multiple Pain Sites No             Capillary Blood Glucose: No results found for this or any previous visit (from the past 24 hour(s)).    Social History   Tobacco Use  Smoking Status Former   Packs/day: 2.00   Years: 17.00   Additional pack years: 0.00   Total pack years: 34.00   Types: Cigarettes   Start date: 02/17/1968   Quit date: 02/28/1986   Years since quitting: 36.8   Passive exposure: Never  Smokeless Tobacco Never    Goals Met:  Independence with exercise equipment Exercise tolerated well No report of concerns or symptoms today Strength training completed today  Goals Unmet:  Not Applicable  Comments: check out time Is 0915   Dr. Dina Rich is Medical Director for [facility] Cardiac Rehab

## 2022-12-31 ENCOUNTER — Encounter (HOSPITAL_COMMUNITY)
Admission: RE | Admit: 2022-12-31 | Discharge: 2022-12-31 | Disposition: A | Discharge status: Home / Self Care | Payer: Medicare PPO | Source: Ambulatory Visit | Attending: Cardiology | Admitting: Cardiology

## 2022-12-31 DIAGNOSIS — Z95811 Presence of heart assist device: Secondary | ICD-10-CM

## 2022-12-31 DIAGNOSIS — I5042 Chronic combined systolic (congestive) and diastolic (congestive) heart failure: Secondary | ICD-10-CM | POA: Diagnosis not present

## 2022-12-31 DIAGNOSIS — I5023 Acute on chronic systolic (congestive) heart failure: Secondary | ICD-10-CM

## 2022-12-31 NOTE — Progress Notes (Signed)
Cardiac Individual Treatment Plan  Patient Details  Name: Ricardo Zuniga MRN: [id number] Date of Birth: [birth year] Referring Provider:   Flowsheet Row CARDIAC REHAB PHASE II ORIENTATION from 11/04/2022 in [facility] CARDIAC REHABILITATION  Referring Provider Dr. Delia Chimes       Initial Encounter Date:  Flowsheet Row CARDIAC REHAB PHASE II ORIENTATION from 11/04/2022 in [facility] CARDIAC REHABILITATION  Date 11/04/22       Visit Diagnosis: Acute on chronic systolic (congestive) heart failure (HCC)  LVAD (left ventricular assist device) present (HCC)  Patient's Home Medications on Admission:  Current Outpatient Medications:    acetaminophen (TYLENOL) 650 MG CR tablet, Take 650-1,300 mg by mouth every 8 (eight) hours as needed for pain., Disp: , Rfl:    atorvastatin (LIPITOR) 40 MG tablet, Take 1 tablet by mouth once daily, Disp: 90 tablet, Rfl: 1   Cholecalciferol (VITAMIN D3) 125 MCG (5000 UT) TABS, Take 5,000 Units by mouth in the morning., Disp: , Rfl:    Digoxin 62.5 MCG TABS, Take 1 tablet by mouth daily., Disp: 30 tablet, Rfl: 5   docusate sodium (COLACE) 100 MG capsule, Take 200 mg by mouth every evening., Disp: , Rfl:    empagliflozin (JARDIANCE) 10 MG TABS tablet, Take 10 mg by mouth every evening., Disp: , Rfl:    furosemide (LASIX) 20 MG tablet, Take 1 tablet (20 mg total) by mouth every other day., Disp: 90 tablet, Rfl: 3   omeprazole (PRILOSEC) 20 MG capsule, Take 20 mg by mouth daily before breakfast., Disp: , Rfl:    potassium chloride SA (KLOR-CON M) 20 MEQ tablet, Take 1 tablet (20 mEq total) by mouth daily., Disp: 90 tablet, Rfl: 3   repaglinide (PRANDIN) 2 MG tablet, Take 2-4 mg by mouth See admin instructions. Take 2 tablets (4 mg) by mouth in the morning & take 1 tablet (2 mg) by mouth in the evening., Disp: , Rfl:    traZODone (DESYREL) 100 MG tablet, Take 1 tablet (100 mg total) by mouth at bedtime as needed for sleep., Disp: 30 tablet, Rfl: 3   warfarin  (COUMADIN) 5 MG tablet, Take 2.5 mg (1/2 tab) every Tuesday/Saturday and 5 mg (1 tab) all other days or as directed by HF Clinic, Disp: 90 tablet, Rfl: 11  Past Medical History: Past Medical History:  Diagnosis Date   Anal fissure    APML (acute promyelocytic leukemia) in remission (HCC)    Completed treatment 04/2013   CHF (congestive heart failure) (HCC)    Coronary artery disease    Nonobstructive at cardiac catheterization 09/2018   Difficult intubation    w/shoulder surgery at surgical center between 2005-2008   Gastroesophageal reflux disease    History of blood transfusion 2014   History of kidney stones    passed stones   Hypercholesteremia    Neuropathy    bilateral feet   Nonischemic cardiomyopathy (HCC)    a. EF 20% in 2015 - thought to possibly be viral induced or chemotherapy induced from treatments in 2014 b. at 35-40% by echo in 05/2016    Peptic ulcer disease    Pneumonia    Yrs ago   Type 2 diabetes mellitus (HCC)    Ureteral colic    Vertigo    tx with valium prn   Wears glasses    Wears hearing aid in both ears     Tobacco Use: Social History   Tobacco Use  Smoking Status Former   Packs/day: 2.00   Years: 17.00  Additional pack years: 0.00   Total pack years: 34.00   Types: Cigarettes   Start date: 02/17/1968   Quit date: 02/28/1986   Years since quitting: 36.8   Passive exposure: Never  Smokeless Tobacco Never    Labs: Review Flowsheet  More data exists      Latest Ref Rng & Units 09/29/2022 09/30/2022 10/01/2022 10/02/2022 10/03/2022  Labs for ITP Cardiac and Pulmonary Rehab  O2 Saturation % 78.8  82.4  74.3  79.5  68.1  56  58     Capillary Blood Glucose: Lab Results  Component Value Date   GLUCAP 201 (H) 12/01/2022   GLUCAP 139 (H) 11/10/2022   GLUCAP 145 (H) 11/04/2022   GLUCAP 151 (H) 10/07/2022   GLUCAP 168 (H) 10/06/2022    POCT Glucose     Row Name 11/04/22 0944             POCT Blood Glucose   Pre-Exercise 145 mg/dL                 Exercise Target Goals: Exercise Program Goal: Individual exercise prescription set using results from initial 6 min walk test and THRR while considering  patient's activity barriers and safety.   Exercise Prescription Goal: Starting with aerobic activity 30 plus minutes a day, 3 days per week for initial exercise prescription. Provide home exercise prescription and guidelines that participant acknowledges understanding prior to discharge.  Activity Barriers & Risk Stratification:  Activity Barriers & Cardiac Risk Stratification - 11/04/22 0905       Activity Barriers & Cardiac Risk Stratification   Activity Barriers Arthritis;Joint Problems;Decreased Ventricular Function    Cardiac Risk Stratification High             6 Minute Walk:  6 Minute Walk     Row Name 11/04/22 1009         6 Minute Walk   Phase Initial     Distance 1100 feet     Walk Time 6 minutes     # of Rest Breaks 1     MPH 2.08     METS 2.39     RPE 12     VO2 Peak 8.36     Symptoms Yes (comment)     Comments one sitting break 1 minute due to fatigue     Resting HR 82 bpm     Resting BP --  78 MAP     Resting Oxygen Saturation  96 %     Exercise Oxygen Saturation  during 6 min walk 99 %     Max Ex. HR 111 bpm     Max Ex. BP --  90 MAP     2 Minute Post BP --  80 MAP              Oxygen Initial Assessment:   Oxygen Re-Evaluation:   Oxygen Discharge (Final Oxygen Re-Evaluation):   Initial Exercise Prescription:  Initial Exercise Prescription - 11/04/22 1000       Date of Initial Exercise RX and Referring Provider   Date 11/04/22    Referring Provider Dr. Delia Chimes    Expected Discharge Date 01/30/23      NuStep   Level 1    SPM 65    Minutes 39      Prescription Details   Frequency (times per week) 3    Duration Progress to 30 minutes of continuous aerobic without signs/symptoms of physical distress      Intensity  THRR 40-80% of Max Heartrate 59-118     Ratings of Perceived Exertion 11-13      Resistance Training   Training Prescription Yes    Weight 2    Reps 10-15             Perform Capillary Blood Glucose checks as needed.  Exercise Prescription Changes:   Exercise Prescription Changes     Row Name 11/17/22 1000 12/01/22 1300 12/15/22 1200 12/29/22 1100       Response to Exercise   Blood Pressure (Admit) --  98 MAP --  80 MAP --  88 MAP --  96 MAP    Blood Pressure (Exercise) --  92 MAP --  78 MAP --  90 MAP --  98 MAP    Blood Pressure (Exit) --  76 MAP --  76 MAP --  90 MAP --  94 MAP    Heart Rate (Admit) 84 bpm 101 bpm 108 bpm 104 bpm    Heart Rate (Exercise) 107 bpm 125 bpm 135 bpm 128 bpm    Heart Rate (Exit) 90 bpm 93 bpm 118 bpm 113 bpm    Rating of Perceived Exertion (Exercise) 12 12 12 11     Duration Continue with 30 min of aerobic exercise without signs/symptoms of physical distress. Continue with 30 min of aerobic exercise without signs/symptoms of physical distress. Continue with 30 min of aerobic exercise without signs/symptoms of physical distress. Continue with 30 min of aerobic exercise without signs/symptoms of physical distress.    Intensity THRR unchanged THRR unchanged THRR unchanged THRR unchanged      Progression   Progression Continue to progress workloads to maintain intensity without signs/symptoms of physical distress. Continue to progress workloads to maintain intensity without signs/symptoms of physical distress. Continue to progress workloads to maintain intensity without signs/symptoms of physical distress. Continue to progress workloads to maintain intensity without signs/symptoms of physical distress.      Resistance Training   Training Prescription Yes Yes Yes Yes    Weight 4 4 4 4     Reps 10-15 10-15 10-15 10-15    Time 10 Minutes 10 Minutes 10 Minutes 10 Minutes      NuStep   Level 2 2 2 2     SPM 118 120 114 115    Minutes 39 39 39 39    METs 4.2 3.75 3.73 3.63              Exercise Comments:   Exercise Goals and Review:   Exercise Goals     Row Name 11/04/22 1012 12/01/22 1320 12/29/22 1146         Exercise Goals   Increase Physical Activity Yes Yes Yes     Intervention Develop an individualized exercise prescription for aerobic and resistive training based on initial evaluation findings, risk stratification, comorbidities and participant's personal goals.;Provide advice, education, support and counseling about physical activity/exercise needs. Develop an individualized exercise prescription for aerobic and resistive training based on initial evaluation findings, risk stratification, comorbidities and participant's personal goals.;Provide advice, education, support and counseling about physical activity/exercise needs. Develop an individualized exercise prescription for aerobic and resistive training based on initial evaluation findings, risk stratification, comorbidities and participant's personal goals.;Provide advice, education, support and counseling about physical activity/exercise needs.     Expected Outcomes Short Term: Attend rehab on a regular basis to increase amount of physical activity.;Long Term: Add in home exercise to make exercise part of routine and to increase amount of physical activity.;Long Term:  Exercising regularly at least 3-5 days a week. Short Term: Attend rehab on a regular basis to increase amount of physical activity.;Long Term: Add in home exercise to make exercise part of routine and to increase amount of physical activity.;Long Term: Exercising regularly at least 3-5 days a week. Short Term: Attend rehab on a regular basis to increase amount of physical activity.;Long Term: Add in home exercise to make exercise part of routine and to increase amount of physical activity.;Long Term: Exercising regularly at least 3-5 days a week.     Increase Strength and Stamina Yes Yes Yes     Intervention Provide advice, education, support and  counseling about physical activity/exercise needs.;Develop an individualized exercise prescription for aerobic and resistive training based on initial evaluation findings, risk stratification, comorbidities and participant's personal goals. Provide advice, education, support and counseling about physical activity/exercise needs.;Develop an individualized exercise prescription for aerobic and resistive training based on initial evaluation findings, risk stratification, comorbidities and participant's personal goals. Provide advice, education, support and counseling about physical activity/exercise needs.;Develop an individualized exercise prescription for aerobic and resistive training based on initial evaluation findings, risk stratification, comorbidities and participant's personal goals.     Expected Outcomes Short Term: Increase workloads from initial exercise prescription for resistance, speed, and METs.;Short Term: Perform resistance training exercises routinely during rehab and add in resistance training at home;Long Term: Improve cardiorespiratory fitness, muscular endurance and strength as measured by increased METs and functional capacity ( ) Short Term: Increase workloads from initial exercise prescription for resistance, speed, and METs.;Short Term: Perform resistance training exercises routinely during rehab and add in resistance training at home;Long Term: Improve cardiorespiratory fitness, muscular endurance and strength as measured by increased METs and functional capacity ( ) Short Term: Increase workloads from initial exercise prescription for resistance, speed, and METs.;Short Term: Perform resistance training exercises routinely during rehab and add in resistance training at home;Long Term: Improve cardiorespiratory fitness, muscular endurance and strength as measured by increased METs and functional capacity ( )     Able to understand and use rate of perceived exertion (RPE) scale Yes  Yes Yes     Intervention Provide education and explanation on how to use RPE scale Provide education and explanation on how to use RPE scale Provide education and explanation on how to use RPE scale     Expected Outcomes Short Term: Able to use RPE daily in rehab to express subjective intensity level;Long Term:  Able to use RPE to guide intensity level when exercising independently Short Term: Able to use RPE daily in rehab to express subjective intensity level;Long Term:  Able to use RPE to guide intensity level when exercising independently Short Term: Able to use RPE daily in rehab to express subjective intensity level;Long Term:  Able to use RPE to guide intensity level when exercising independently     Knowledge and understanding of Target Heart Rate Range (THRR) Yes Yes Yes     Intervention Provide education and explanation of THRR including how the numbers were predicted and where they are located for reference Provide education and explanation of THRR including how the numbers were predicted and where they are located for reference Provide education and explanation of THRR including how the numbers were predicted and where they are located for reference     Expected Outcomes Short Term: Able to state/look up THRR;Short Term: Able to use daily as guideline for intensity in rehab;Long Term: Able to use THRR to govern intensity when exercising independently Short Term: Able to state/look  up THRR;Short Term: Able to use daily as guideline for intensity in rehab;Long Term: Able to use THRR to govern intensity when exercising independently Short Term: Able to state/look up THRR;Short Term: Able to use daily as guideline for intensity in rehab;Long Term: Able to use THRR to govern intensity when exercising independently     Able to check pulse independently Yes Yes Yes     Intervention Provide education and demonstration on how to check pulse in carotid and radial arteries.;Review the importance of being  able to check your own pulse for safety during independent exercise Provide education and demonstration on how to check pulse in carotid and radial arteries.;Review the importance of being able to check your own pulse for safety during independent exercise Provide education and demonstration on how to check pulse in carotid and radial arteries.;Review the importance of being able to check your own pulse for safety during independent exercise     Expected Outcomes Short Term: Able to explain why pulse checking is important during independent exercise;Long Term: Able to check pulse independently and accurately Short Term: Able to explain why pulse checking is important during independent exercise;Long Term: Able to check pulse independently and accurately Short Term: Able to explain why pulse checking is important during independent exercise;Long Term: Able to check pulse independently and accurately     Understanding of Exercise Prescription Yes Yes Yes     Intervention Provide education, explanation, and written materials on patient's individual exercise prescription Provide education, explanation, and written materials on patient's individual exercise prescription Provide education, explanation, and written materials on patient's individual exercise prescription     Expected Outcomes Short Term: Able to explain program exercise prescription;Long Term: Able to explain home exercise prescription to exercise independently Short Term: Able to explain program exercise prescription;Long Term: Able to explain home exercise prescription to exercise independently Short Term: Able to explain program exercise prescription;Long Term: Able to explain home exercise prescription to exercise independently              Exercise Goals Re-Evaluation :  Exercise Goals Re-Evaluation     Row Name 12/01/22 1320 12/29/22 1147           Exercise Goal Re-Evaluation   Exercise Goals Review Increase Physical  Activity;Increase Strength and Stamina;Able to understand and use rate of perceived exertion (RPE) scale;Able to check pulse independently;Knowledge and understanding of Target Heart Rate Range (THRR);Understanding of Exercise Prescription Increase Physical Activity;Increase Strength and Stamina;Able to understand and use rate of perceived exertion (RPE) scale;Knowledge and understanding of Target Heart Rate Range (THRR);Able to check pulse independently;Understanding of Exercise Prescription      Comments Pt has completed 11 sessions of cardiac rehab. He is very egar to be in the program and is progressing/ tolerating exerise well. He is currently exercising at 3.75 METs on the stepper. Will continue to monitor and progress as able. Pt has completed 23 sessions of cardiac rehab. He remains egar to be in the program and is progressing well. He has had two dizzy spells during exercise and has stopped exercising. HE called LVAD clinic after and modifations were done. He is currently exercising at 3.63 METs on the stepper. Will continue to monitor and progress as able.      Expected Outcomes Through exercise at rehab and home, the patient will meet their stated goals Through exercise at rehab and home, the patient will meet their stated goals  Discharge Exercise Prescription (Final Exercise Prescription Changes):  Exercise Prescription Changes - 12/29/22 1100       Response to Exercise   Blood Pressure (Admit) --   96 MAP   Blood Pressure (Exercise) --   98 MAP   Blood Pressure (Exit) --   94 MAP   Heart Rate (Admit) 104 bpm    Heart Rate (Exercise) 128 bpm    Heart Rate (Exit) 113 bpm    Rating of Perceived Exertion (Exercise) 11    Duration Continue with 30 min of aerobic exercise without signs/symptoms of physical distress.    Intensity THRR unchanged      Progression   Progression Continue to progress workloads to maintain intensity without signs/symptoms of physical  distress.      Resistance Training   Training Prescription Yes    Weight 4    Reps 10-15    Time 10 Minutes      NuStep   Level 2    SPM 115    Minutes 39    METs 3.63             Nutrition:  Target Goals: Understanding of nutrition guidelines, daily intake of sodium 1500mg , cholesterol 200mg , calories 30% from fat and 7% or less from saturated fats, daily to have 5 or more servings of fruits and vegetables.  Biometrics:  Pre Biometrics - 11/04/22 1013       Pre Biometrics   Height 5' 9.5" (1.765 m)    Weight 78.9 kg    Waist Circumference 36 inches    Hip Circumference 40 inches    Waist to Hip Ratio 0.9 %    BMI (Calculated) 25.33    Triceps Skinfold 10 mm    % Body Fat 23.2 %    Grip Strength 13.3 kg    Flexibility 0 in    Single Leg Stand 0 seconds              Nutrition Therapy Plan and Nutrition Goals:  Nutrition Therapy & Goals - 11/05/22 0722       Personal Nutrition Goals   Comments We provide educational sessions on heart heathy nutrition and assistance with RD referral if patient is interested.      Intervention Plan   Intervention Nutrition handout(s) given to patient.    Expected Outcomes Short Term Goal: Understand basic principles of dietary content, such as calories, fat, sodium, cholesterol and nutrients.             Nutrition Assessments:  Nutrition Assessments - 11/04/22 0914       MEDFICTS Scores   Pre Score 118            MEDIFICTS Score Key: ?70 Need to make dietary changes  40-70 Heart Healthy Diet ? 40 Therapeutic Level Cholesterol Diet   Picture Your Plate Scores: <16 Unhealthy dietary pattern with much room for improvement. 41-50 Dietary pattern unlikely to meet recommendations for good health and room for improvement. 51-60 More healthful dietary pattern, with some room for improvement.  >60 Healthy dietary pattern, although there may be some specific behaviors that could be improved.    Nutrition  Goals Re-Evaluation:   Nutrition Goals Discharge (Final Nutrition Goals Re-Evaluation):   Psychosocial: Target Goals: Acknowledge presence or absence of significant depression and/or stress, maximize coping skills, provide positive support system. Participant is able to verbalize types and ability to use techniques and skills needed for reducing stress and depression.  Initial Review & Psychosocial Screening:  Initial Psych Review & Screening - 11/04/22 0911       Initial Review   Current issues with Current Sleep Concerns      Family Dynamics   Good Support System? Yes    Comments His wife and his children are his main support system.      Barriers   Psychosocial barriers to participate in program There are no identifiable barriers or psychosocial needs.      Screening Interventions   Interventions Encouraged to exercise;Provide feedback about the scores to participant    Expected Outcomes Long Term goal: The participant improves quality of Life and PHQ9 Scores as seen by post scores and/or verbalization of changes;Short Term goal: Identification and review with participant of any Quality of Life or Depression concerns found by scoring the questionnaire.             Quality of Life Scores:  Quality of Life - 11/04/22 1013       Quality of Life   Select Quality of Life      Quality of Life Scores   Health/Function Pre 28.5 %    Socioeconomic Pre 28.21 %    Psych/Spiritual Pre 29.64 %    Family Pre 30 %    GLOBAL Pre 28.91 %            Scores of 19 and below usually indicate a poorer quality of life in these areas.  A difference of  2-3 points is a clinically meaningful difference.  A difference of 2-3 points in the total score of the Quality of Life Index has been associated with significant improvement in overall quality of life, self-image, physical symptoms, and general health in studies assessing change in quality of life.  PHQ-9: Review Flowsheet        11/04/2022 09/03/2022  Depression screen PHQ 2/9  Decreased Interest 0 0  Down, Depressed, Hopeless 0 0  PHQ - 2 Score 0 0  Altered sleeping 0 -  Tired, decreased energy 1 -  Change in appetite 1 -  Feeling bad or failure about yourself  0 -  Trouble concentrating 0 -  Moving slowly or fidgety/restless 0 -  Suicidal thoughts 0 -  PHQ-9 Score 2 -  Difficult doing work/chores Not difficult at all -   Interpretation of Total Score  Total Score Depression Severity:  1-4 = Minimal depression, 5-9 = Mild depression, 10-14 = Moderate depression, 15-19 = Moderately severe depression, 20-27 = Severe depression   Psychosocial Evaluation and Intervention:  Psychosocial Evaluation - 11/04/22 0950       Psychosocial Evaluation & Interventions   Interventions Relaxation education;Stress management education;Encouraged to exercise with the program and follow exercise prescription    Comments Pt has no barriers to participate in CR. He has no identifiable psychosocial issues. He does take Trazodone for sleep, and he reports that he was prescribed this previous to getting his LVAD. He states that he sleeps great at night. He scored a 2 on his PHQ-9 and this relates to his energy levels and his lack of appetite. His energy levels continue to improve as he recovers from his LVAD. His appetite is beginning to come back. He lost about 30 lbs while in the hospital during his LVAD implantation, but some of this was fluid weight. He states that he has been told by his doctors to try to put on weight. He has a very positive outlook and has adjusted well to his LVAD. He states that he has  a good support system with his wife and his children. His goals while in the program are to increase his strength and stamina and to return to his usual yard work. He is eager to start the program and motivated to improve himself.    Expected Outcomes Pt's sleep will continue to be managed with Trazodone, and he will continue to have  no other identifialble psychosocial issues.    Continue Psychosocial Services  No Follow up required             Psychosocial Re-Evaluation:  Psychosocial Re-Evaluation     Row Name 11/26/22 1250 12/24/22 1610           Psychosocial Re-Evaluation   Current issues with None Identified None Identified      Comments Patient is new to the program. He has completed 7 sessions. He continues to have no psychosocial barriers identified. His sleep continues to be managed with Trazadone. He enjoys the sessions and demonstrates an interest in improving his health. We will continue to monitor his progress. Patient has completed 19 sessions. He continues to have no psychosocial barriers identified. His sleep continues to be managed with Trazadone. He continues to enjoy the sessions and demonstrates an interest in improving his health. We will continue to monitor his progress.      Expected Outcomes Patient will continue to have no psychosocial barriers identified and his sleep will continue to be managed with Trazadone. Patient will continue to have no psychosocial barriers identified and his sleep will continue to be managed with Trazadone.      Interventions Stress management education;Relaxation education;Encouraged to attend Cardiac Rehabilitation for the exercise Stress management education;Relaxation education;Encouraged to attend Cardiac Rehabilitation for the exercise      Continue Psychosocial Services  No Follow up required No Follow up required               Psychosocial Discharge (Final Psychosocial Re-Evaluation):  Psychosocial Re-Evaluation - 12/24/22 9604       Psychosocial Re-Evaluation   Current issues with None Identified    Comments Patient has completed 19 sessions. He continues to have no psychosocial barriers identified. His sleep continues to be managed with Trazadone. He continues to enjoy the sessions and demonstrates an interest in improving his health. We will  continue to monitor his progress.    Expected Outcomes Patient will continue to have no psychosocial barriers identified and his sleep will continue to be managed with Trazadone.    Interventions Stress management education;Relaxation education;Encouraged to attend Cardiac Rehabilitation for the exercise    Continue Psychosocial Services  No Follow up required             Vocational Rehabilitation: Provide vocational rehab assistance to qualifying candidates.   Vocational Rehab Evaluation & Intervention:  Vocational Rehab - 11/04/22 0919       Initial Vocational Rehab Evaluation & Intervention   Assessment shows need for Vocational Rehabilitation No      Vocational Rehab Re-Evaulation   Comments retired             Education: Education Goals: Education classes will be provided on a weekly basis, covering required topics. Participant will state understanding/return demonstration of topics presented.  Learning Barriers/Preferences:  Learning Barriers/Preferences - 11/04/22 0919       Learning Barriers/Preferences   Learning Barriers Hearing    Learning Preferences Audio;Pictoral;Written Material             Education Topics: Hypertension, Hypertension Reduction -Define heart disease  and high blood pressure. Discus how high blood pressure affects the body and ways to reduce high blood pressure.   Exercise and Your Heart -Discuss why it is important to exercise, the FITT principles of exercise, normal and abnormal responses to exercise, and how to exercise safely. Flowsheet Row CARDIAC REHAB PHASE II EXERCISE from 12/24/2022 in [place] CARDIAC REHABILITATION  Date 11/26/22  Educator HB  Instruction Review Code 1- Verbalizes Understanding       Angina -Discuss definition of angina, causes of angina, treatment of angina, and how to decrease risk of having angina. Flowsheet Row CARDIAC REHAB PHASE II EXERCISE from 12/24/2022 in [place] CARDIAC  REHABILITATION  Date 12/03/22  Educator HB  Instruction Review Code 2- Demonstrated Understanding       Cardiac Medications -Review what the following cardiac medications are used for, how they affect the body, and side effects that may occur when taking the medications.  Medications include Aspirin, Beta blockers, calcium channel blockers, ACE Inhibitors, angiotensin receptor blockers, diuretics, digoxin, and antihyperlipidemics.   Congestive Heart Failure -Discuss the definition of CHF, how to live with CHF, the signs and symptoms of CHF, and how keep track of weight and sodium intake. Flowsheet Row CARDIAC REHAB PHASE II EXERCISE from 12/24/2022 in [place] CARDIAC REHABILITATION  Date 12/10/22  Educator HB  Instruction Review Code 1- Verbalizes Understanding       Heart Disease and Intimacy -Discus the effect sexual activity has on the heart, how changes occur during intimacy as we age, and safety during sexual activity. Flowsheet Row CARDIAC REHAB PHASE II EXERCISE from 12/24/2022 in [place] CARDIAC REHABILITATION  Date 12/17/22  Educator HB  Instruction Review Code 1- Verbalizes Understanding       Smoking Cessation / COPD -Discuss different methods to quit smoking, the health benefits of quitting smoking, and the definition of COPD. Flowsheet Row CARDIAC REHAB PHASE II EXERCISE from 12/24/2022 in [place] CARDIAC REHABILITATION  Date 12/24/22  Educator hb  Instruction Review Code 2- Demonstrated Understanding       Nutrition I: Fats -Discuss the types of cholesterol, what cholesterol does to the heart, and how cholesterol levels can be controlled.   Nutrition II: Labels -Discuss the different components of food labels and how to read food label   Heart Parts/Heart Disease and PAD -Discuss the anatomy of the heart, the pathway of blood circulation through the heart, and these are affected by heart disease. Flowsheet Row CARDIAC REHAB PHASE II EXERCISE  from 12/24/2022 in [place] CARDIAC REHABILITATION  Date 11/19/22  Educator HB  Instruction Review Code 1- Verbalizes Understanding       Stress I: Signs and Symptoms -Discuss the causes of stress, how stress may lead to anxiety and depression, and ways to limit stress.   Stress II: Relaxation -Discuss different types of relaxation techniques to limit stress.   Warning Signs of Stroke / TIA -Discuss definition of a stroke, what the signs and symptoms are of a stroke, and how to identify when someone is having stroke. Flowsheet Row CARDIAC REHAB PHASE II EXERCISE from 12/24/2022 in [place] CARDIAC REHABILITATION  Date 11/12/22  Educator DF  Instruction Review Code 2- Demonstrated Understanding       Knowledge Questionnaire Score:  Knowledge Questionnaire Score - 11/04/22 0920       Knowledge Questionnaire Score   Pre Score 20/24             Core Components/Risk Factors/Patient Goals at Admission:  Personal  Goals and Risk Factors at Admission - 11/04/22 0923       Core Components/Risk Factors/Patient Goals on Admission    Weight Management Yes;Weight Gain    Intervention Weight Management: Develop a combined nutrition and exercise program designed to reach desired caloric intake, while maintaining appropriate intake of nutrient and fiber, sodium and fats, and appropriate energy expenditure required for the weight goal.;Weight Management: Provide education and appropriate resources to help participant work on and attain dietary goals.    Expected Outcomes Weight Gain: Understanding of general recommendations for a high calorie, high protein meal plan that promotes weight gain by distributing calorie intake throughout the day with the consumption for 4-5 meals, snacks, and/or supplements    Diabetes Yes    Intervention Provide education about signs/symptoms and action to take for hypo/hyperglycemia.;Provide education about proper nutrition, including hydration, and  aerobic/resistive exercise prescription along with prescribed medications to achieve blood glucose in normal ranges: Fasting glucose 65-99 mg/dL    Expected Outcomes Short Term: Participant verbalizes understanding of the signs/symptoms and immediate care of hyper/hypoglycemia, proper foot care and importance of medication, aerobic/resistive exercise and nutrition plan for blood glucose control.;Long Term: Attainment of HbA1C < 7%.    Personal Goal Other Yes    Personal Goal Increase strength and stamina; return to doing his regular yard work    Intervention Attend CR three days per week and begin a home exercise program    Expected Outcomes Pt will meet stated goals.             Core Components/Risk Factors/Patient Goals Review:   Goals and Risk Factor Review     Row Name 11/26/22 1251 12/24/22 0853           Core Components/Risk Factors/Patient Goals Review   Personal Goals Review Weight Management/Obesity;Diabetes;Other Weight Management/Obesity;Diabetes;Other      Review Patient was referred to CR with acute on chronic HF and LVAD placement. He has multiple risk factors for CAD and is participating in the program for risk modification. He has completed 7 sessions with his current weight at 178.5 lbs up 4.5 lbs since his orientation visit. He is doing well in the program with consistent attendance and progressions. His DM is managed with Jardiance and Prandin. His last A1C on file was 09/24/22 at 7.0%. His dopplers readings are WNL. His personal goals for the program are to increase his strength and stamina and return to his usual hyard work and activities. We will continue to monitor his progress as he works towards meeting these goals. Patient has completed 19 sessions with his current weight at 174.0 lbs down 4.5 lbs since last 30 day review. He is continue to do well in the program with consistent attendance and progressions. His DM continues to be managed with Jardiance and Prandin. His  last A1C on file was 09/24/22 at 7.0%. His dopplers readings are WNL. Patient saw Dr. Shawna Clamp 4/18 for routine check and complained of dizziness. He discontinued his Losartan and Potassium. Patient was seen again for a sick visit 4/22 complaining of dizziness with near syncope. MD discontinued Spirolactone and added Potassium and adjusted his LVAD settings. Paitent called the clinic 4/29 and symptoms had resolved. MD was notified 5/8 about his elevated HR in CR. He said it was okay to start him if his resting HR was 110-115 and increased his exercise HR to 130-140. His personal goals for the program are to increase his strength and stamina and return to his usual hyard work  and activities. We will continue to monitor his progress as he works towards meeting these goals.      Expected Outcomes Patient will complete the program meeting both personal and program goals. Patient will complete the program meeting both personal and program goals.               Core Components/Risk Factors/Patient Goals at Discharge (Final Review):   Goals and Risk Factor Review - 12/24/22 0853       Core Components/Risk Factors/Patient Goals Review   Personal Goals Review Weight Management/Obesity;Diabetes;Other    Review Patient has completed 19 sessions with his current weight at 174.0 lbs down 4.5 lbs since last 30 day review. He is continue to do well in the program with consistent attendance and progressions. His DM continues to be managed with Jardiance and Prandin. His last A1C on file was 09/24/22 at 7.0%. His dopplers readings are WNL. Patient saw Dr. Shawna Clamp 4/18 for routine check and complained of dizziness. He discontinued his Losartan and Potassium. Patient was seen again for a sick visit 4/22 complaining of dizziness with near syncope. MD discontinued Spirolactone and added Potassium and adjusted his LVAD settings. Paitent called the clinic 4/29 and symptoms had resolved. MD was notified 5/8 about his elevated  HR in CR. He said it was okay to start him if his resting HR was 110-115 and increased his exercise HR to 130-140. His personal goals for the program are to increase his strength and stamina and return to his usual hyard work and activities. We will continue to monitor his progress as he works towards meeting these goals.    Expected Outcomes Patient will complete the program meeting both personal and program goals.             ITP Comments:  ITP Comments     Row Name 12/17/22 1236           ITP Comments Contacted Dr. Gasper Lloyd regarding patients elevated HR via sucure chat. Dr. Gasper Lloyd said he was fine starting him with a resing HR of 110 to 115 and his exercise target should be 130 to 140.                Comments: ITP REVIEW Pt is making expected progress toward Cardiac Rehab goals after completing 23 sessions. Recommend continued exercise, life style modification, education, and increased stamina and strength.

## 2022-12-31 NOTE — Progress Notes (Signed)
Daily Session Note  Patient Details  Name: Ricardo Zuniga MRN: [id number] Date of Birth: March 17, 1951 Referring Provider:   Flowsheet Row CARDIAC REHAB PHASE II ORIENTATION from 11/04/2022 in [facility] CARDIAC REHABILITATION  Referring Provider Dr. Delia Chimes       Encounter Date: 12/31/2022  Check In:  Session Check In - 12/31/22 0813       Check-In   Supervising physician immediately available to respond to emergencies [facility] MD immediately available    Physician(s) Dr. Dietrich Pates    Location [facility]    Staff Present Cristopher Peru MHA, MS, ACSM-CEP;Chelcy Bolda Laural Benes, RN, BSN;Heather Fredric Mare, BS, Exercise Physiologist    Virtual Visit No    Medication changes reported     No    Fall or balance concerns reported    No    Tobacco Cessation No Change    Warm-up and Cool-down Performed as group-led instruction    VAD Patient? Yes    PAD/SET Patient? No      VAD patient   Has back up controller? Yes    Has battery cables? Yes    Has compatible battery clips? Yes      Pain Assessment   Currently in Pain? No/denies    Multiple Pain Sites No             Capillary Blood Glucose: No results found for this or any previous visit (from the past 24 hour(s)).    Social History   Tobacco Use  Smoking Status Former   Packs/day: 2.00   Years: 17.00   Additional pack years: 0.00   Total pack years: 34.00   Types: Cigarettes   Start date: 02/17/1968   Quit date: 02/28/1986   Years since quitting: 36.8   Passive exposure: Never  Smokeless Tobacco Never    Goals Met:  Independence with exercise equipment Exercise tolerated well No report of concerns or symptoms today Strength training completed today  Goals Unmet:  Not Applicable  Comments: Check out 915.   Dr. Dina Rich is Medical Director for [facility] Cardiac Rehab

## 2023-01-02 ENCOUNTER — Encounter (HOSPITAL_COMMUNITY)
Admission: RE | Admit: 2023-01-02 | Discharge: 2023-01-02 | Disposition: A | Discharge status: Home / Self Care | Payer: Medicare PPO | Source: Ambulatory Visit | Attending: Cardiology | Admitting: Cardiology

## 2023-01-02 DIAGNOSIS — Z95811 Presence of heart assist device: Secondary | ICD-10-CM | POA: Diagnosis not present

## 2023-01-02 DIAGNOSIS — I5042 Chronic combined systolic (congestive) and diastolic (congestive) heart failure: Secondary | ICD-10-CM | POA: Diagnosis not present

## 2023-01-02 DIAGNOSIS — I5023 Acute on chronic systolic (congestive) heart failure: Secondary | ICD-10-CM

## 2023-01-02 NOTE — Progress Notes (Signed)
Daily Session Note  Patient Details  Name: Ricardo Zuniga MRN: [id number] Date of Birth: Jan 05, 1951 Referring Provider:   Flowsheet Row CARDIAC REHAB PHASE II ORIENTATION from 11/04/2022 in [facility] CARDIAC REHABILITATION  Referring Provider Dr. Delia Chimes       Encounter Date: 01/02/2023  Check In:  Session Check In - 01/02/23 0815       Check-In   Supervising physician immediately available to respond to emergencies [facility] MD immediately available    Physician(s) Dr. Rennis Golden    Location [facility]    Staff Present Ross Ludwig, BS, Exercise Physiologist;Dalton Debbe Mounts, MS, ACSM-CEP;Aquilla Voiles Laural Benes, RN, Pleas Koch, RN, BSN    Virtual Visit No    Medication changes reported     No    Fall or balance concerns reported    No    Tobacco Cessation No Change    Warm-up and Cool-down Performed as group-led instruction    Resistance Training Performed Yes    VAD Patient? Yes    PAD/SET Patient? No      VAD patient   Has back up controller? Yes    Has spare charged batteries? Yes    Has battery cables? Yes    Has compatible battery clips? Yes      Pain Assessment   Currently in Pain? No/denies    Multiple Pain Sites No             Capillary Blood Glucose: No results found for this or any previous visit (from the past 24 hour(s)).    Social History   Tobacco Use  Smoking Status Former   Packs/day: 2.00   Years: 17.00   Additional pack years: 0.00   Total pack years: 34.00   Types: Cigarettes   Start date: 02/17/1968   Quit date: 02/28/1986   Years since quitting: 36.8   Passive exposure: Never  Smokeless Tobacco Never    Goals Met:  Independence with exercise equipment Exercise tolerated well No report of concerns or symptoms today Strength training completed today  Goals Unmet:  Not Applicable  Comments: Check out 915.   Dr. Dina Rich is Medical Director for [facility] Cardiac Rehab

## 2023-01-05 ENCOUNTER — Encounter (HOSPITAL_COMMUNITY): Payer: Medicare PPO

## 2023-01-07 ENCOUNTER — Encounter (HOSPITAL_COMMUNITY)
Admission: RE | Admit: 2023-01-07 | Discharge: 2023-01-07 | Disposition: A | Discharge status: Home / Self Care | Payer: Medicare PPO | Source: Ambulatory Visit | Attending: Cardiology | Admitting: Cardiology

## 2023-01-07 ENCOUNTER — Other Ambulatory Visit (HOSPITAL_COMMUNITY): Payer: Self-pay

## 2023-01-07 DIAGNOSIS — Z95811 Presence of heart assist device: Secondary | ICD-10-CM | POA: Diagnosis not present

## 2023-01-07 DIAGNOSIS — Z7901 Long term (current) use of anticoagulants: Secondary | ICD-10-CM

## 2023-01-07 DIAGNOSIS — I5042 Chronic combined systolic (congestive) and diastolic (congestive) heart failure: Secondary | ICD-10-CM | POA: Diagnosis not present

## 2023-01-07 DIAGNOSIS — I5023 Acute on chronic systolic (congestive) heart failure: Secondary | ICD-10-CM

## 2023-01-07 NOTE — Progress Notes (Signed)
Daily Session Note  Patient Details  Name: Ricardo Zuniga MRN: [id number] Date of Birth: 16-Feb-1951 Referring Provider:   Flowsheet Row CARDIAC REHAB PHASE II ORIENTATION from 11/04/2022 in [facility] CARDIAC REHABILITATION  Referring Provider Dr. Delia Chimes       Encounter Date: 01/07/2023  Check In:  Session Check In - 01/07/23 0815       Check-In   Supervising physician immediately available to respond to emergencies [facility] MD immediately available    Physician(s) Dr. Dina Rich    Location [facility]    Staff Present Ross Ludwig, BS, Exercise Physiologist;Maevis Mumby Laural Benes, RN, Pleas Koch, RN, BSN;Hillary Troutman BSN, RN    Virtual Visit No    Medication changes reported     No    Fall or balance concerns reported    No    Tobacco Cessation No Change    Warm-up and Cool-down Performed as group-led Writer Performed Yes    VAD Patient? Yes    PAD/SET Patient? No      VAD patient   Has back up controller? Yes    Has spare charged batteries? Yes    Has battery cables? Yes    Has compatible battery clips? Yes      Pain Assessment   Currently in Pain? No/denies    Multiple Pain Sites No             Capillary Blood Glucose: No results found for this or any previous visit (from the past 24 hour(s)).    Social History   Tobacco Use  Smoking Status Former   Packs/day: 2.00   Years: 17.00   Additional pack years: 0.00   Total pack years: 34.00   Types: Cigarettes   Start date: 02/17/1968   Quit date: 02/28/1986   Years since quitting: 36.8   Passive exposure: Never  Smokeless Tobacco Never    Goals Met:  Independence with exercise equipment Exercise tolerated well No report of concerns or symptoms today Strength training completed today  Goals Unmet:  Not Applicable  Comments: Check out 915.   Dr. Dina Rich is Medical Director for [facility] Cardiac Rehab

## 2023-01-09 ENCOUNTER — Encounter (HOSPITAL_COMMUNITY)
Admission: RE | Admit: 2023-01-09 | Discharge: 2023-01-09 | Disposition: A | Discharge status: Home / Self Care | Payer: Medicare PPO | Source: Ambulatory Visit | Attending: Cardiology | Admitting: Cardiology

## 2023-01-09 DIAGNOSIS — I5042 Chronic combined systolic (congestive) and diastolic (congestive) heart failure: Secondary | ICD-10-CM | POA: Diagnosis not present

## 2023-01-09 DIAGNOSIS — Z95811 Presence of heart assist device: Secondary | ICD-10-CM | POA: Diagnosis not present

## 2023-01-09 DIAGNOSIS — I5023 Acute on chronic systolic (congestive) heart failure: Secondary | ICD-10-CM

## 2023-01-09 NOTE — Progress Notes (Signed)
Daily Session Note  Patient Details  Name: Ricardo Zuniga MRN: [id number] Date of Birth: 30-Dec-1950 Referring Provider:   Flowsheet Row CARDIAC REHAB PHASE II ORIENTATION from 11/04/2022 in [facility] CARDIAC REHABILITATION  Referring Provider Dr. Delia Chimes       Encounter Date: 01/09/2023  Check In:  Session Check In - 01/09/23 0815       Check-In   Supervising physician immediately available to respond to emergencies [facility] MD immediately available    Physician(s) Dr. Dina Rich    Location [facility]    Staff Present Cristopher Peru MHA, MS, ACSM-CEP;Lindzy Rupert Laural Benes, RN, Pleas Koch, RN, BSN;Phyllis Billingsley, RN    Virtual Visit No    Medication changes reported     No    Fall or balance concerns reported    No    Tobacco Cessation No Change    Warm-up and Cool-down Performed as group-led instruction    Resistance Training Performed Yes    VAD Patient? Yes    PAD/SET Patient? No      VAD patient   Has back up controller? Yes    Has spare charged batteries? Yes    Has battery cables? Yes    Has compatible battery clips? Yes      Pain Assessment   Currently in Pain? No/denies    Multiple Pain Sites No             Capillary Blood Glucose: No results found for this or any previous visit (from the past 24 hour(s)).    Social History   Tobacco Use  Smoking Status Former   Packs/day: 2.00   Years: 17.00   Additional pack years: 0.00   Total pack years: 34.00   Types: Cigarettes   Start date: 02/17/1968   Quit date: 02/28/1986   Years since quitting: 36.8   Passive exposure: Never  Smokeless Tobacco Never    Goals Met:  Independence with exercise equipment Exercise tolerated well No report of concerns or symptoms today Strength training completed today  Goals Unmet:  Not Applicable  Comments: Check out 915.   Dr. Dina Rich is Medical Director for [facility] Cardiac Rehab

## 2023-01-12 ENCOUNTER — Ambulatory Visit (HOSPITAL_COMMUNITY): Payer: Self-pay | Admitting: Pharmacist

## 2023-01-12 ENCOUNTER — Encounter (HOSPITAL_COMMUNITY)
Admission: RE | Admit: 2023-01-12 | Discharge: 2023-01-12 | Disposition: A | Discharge status: Home / Self Care | Payer: Medicare PPO | Source: Ambulatory Visit | Attending: Cardiology | Admitting: Cardiology

## 2023-01-12 ENCOUNTER — Encounter (HOSPITAL_COMMUNITY): Payer: Self-pay | Admitting: Pharmacist

## 2023-01-12 ENCOUNTER — Other Ambulatory Visit (HOSPITAL_COMMUNITY)
Admission: RE | Admit: 2023-01-12 | Discharge: 2023-01-12 | Disposition: A | Discharge status: Home / Self Care | Payer: Medicare PPO | Source: Ambulatory Visit | Attending: Cardiology | Admitting: Cardiology

## 2023-01-12 ENCOUNTER — Other Ambulatory Visit (HOSPITAL_COMMUNITY): Payer: Self-pay

## 2023-01-12 VITALS — Wt 180.8 lb

## 2023-01-12 DIAGNOSIS — Z7901 Long term (current) use of anticoagulants: Secondary | ICD-10-CM | POA: Diagnosis not present

## 2023-01-12 DIAGNOSIS — I5023 Acute on chronic systolic (congestive) heart failure: Secondary | ICD-10-CM | POA: Insufficient documentation

## 2023-01-12 DIAGNOSIS — Z95811 Presence of heart assist device: Secondary | ICD-10-CM

## 2023-01-12 LAB — PROTIME-INR
INR: 1.3 — ABNORMAL HIGH (ref 0.8–1.2)
Prothrombin Time: 16.4 seconds — ABNORMAL HIGH (ref 11.4–15.2)

## 2023-01-12 MED ORDER — ENOXAPARIN SODIUM 40 MG/0.4ML IJ SOSY: 40.0000 mg | PREFILLED_SYRINGE | Freq: Two times a day (BID) | INTRAMUSCULAR | 0 refills | Status: DC

## 2023-01-12 NOTE — Progress Notes (Signed)
Daily Session Note  Patient Details  Name: Ricardo Zuniga MRN: [id number] Date of Birth: 04-11-1951 Referring Provider:   Flowsheet Row CARDIAC REHAB PHASE II ORIENTATION from 11/04/2022 in [facility] CARDIAC REHABILITATION  Referring Provider Dr. Delia Chimes       Encounter Date: 01/12/2023  Check In:  Session Check In - 01/12/23 0813       Check-In   Supervising physician immediately available to respond to emergencies [facility] MD immediately available    Physician(s) Dr. Dietrich Pates    Location [facility]    Staff Present Ross Ludwig, BS, Exercise Physiologist;Phyllis Billingsley, RN;Janine Reller Laural Benes, RN, Pleas Koch, RN, BSN    Virtual Visit No    Medication changes reported     No    Fall or balance concerns reported    No    Tobacco Cessation No Change    Warm-up and Cool-down Performed as group-led instruction    Resistance Training Performed Yes    VAD Patient? Yes    PAD/SET Patient? No      VAD patient   Has back up controller? Yes    Has spare charged batteries? Yes    Has battery cables? Yes    Has compatible battery clips? Yes      Pain Assessment   Currently in Pain? No/denies    Multiple Pain Sites No             Capillary Blood Glucose: No results found for this or any previous visit (from the past 24 hour(s)).    Social History   Tobacco Use  Smoking Status Former   Packs/day: 2.00   Years: 17.00   Additional pack years: 0.00   Total pack years: 34.00   Types: Cigarettes   Start date: 02/17/1968   Quit date: 02/28/1986   Years since quitting: 36.8   Passive exposure: Never  Smokeless Tobacco Never    Goals Met:  Independence with exercise equipment Exercise tolerated well No report of concerns or symptoms today Strength training completed today  Goals Unmet:  Not Applicable  Comments: Check out 915.   Dr. Dina Rich is Medical Director for [facility] Cardiac Rehab

## 2023-01-14 ENCOUNTER — Encounter (HOSPITAL_COMMUNITY)
Admission: RE | Admit: 2023-01-14 | Discharge: 2023-01-14 | Disposition: A | Discharge status: Home / Self Care | Payer: Medicare PPO | Source: Ambulatory Visit | Attending: Cardiology | Admitting: Cardiology

## 2023-01-14 DIAGNOSIS — I5023 Acute on chronic systolic (congestive) heart failure: Secondary | ICD-10-CM

## 2023-01-14 DIAGNOSIS — Z95811 Presence of heart assist device: Secondary | ICD-10-CM

## 2023-01-14 NOTE — Progress Notes (Signed)
Daily Session Note  Patient Details  Name: Ricardo Zuniga MRN: [id number] Date of Birth: 1951/02/22 Referring Provider:   Flowsheet Row CARDIAC REHAB PHASE II ORIENTATION from 11/04/2022 in [facility] CARDIAC REHABILITATION  Referring Provider Dr. Delia Chimes       Encounter Date: 01/14/2023  Check In:  Session Check In - 01/14/23 0815       Check-In   Supervising physician immediately available to respond to emergencies [facility] MD immediately available    Physician(s) Dr. Jenene Slicker    Location [facility]    Staff Present Ross Ludwig, BS, Exercise Physiologist;Kenzley Ke Laural Benes, RN, BSN;Hillary Troutman BSN, RN    Virtual Visit No    Medication changes reported     No    Fall or balance concerns reported    No    Tobacco Cessation No Change    Warm-up and Cool-down Performed as group-led Writer Performed Yes    VAD Patient? Yes    PAD/SET Patient? No      VAD patient   Has back up controller? Yes    Has spare charged batteries? Yes    Has compatible battery clips? Yes      Pain Assessment   Currently in Pain? No/denies    Multiple Pain Sites No             Capillary Blood Glucose: No results found for this or any previous visit (from the past 24 hour(s)).    Social History   Tobacco Use  Smoking Status Former   Packs/day: 2.00   Years: 17.00   Additional pack years: 0.00   Total pack years: 34.00   Types: Cigarettes   Start date: 02/17/1968   Quit date: 02/28/1986   Years since quitting: 36.9   Passive exposure: Never  Smokeless Tobacco Never    Goals Met:  Independence with exercise equipment Exercise tolerated well No report of concerns or symptoms today Strength training completed today  Goals Unmet:  Not Applicable  Comments: Check out 915.   Dr. Dina Rich is Medical Director for [facility] Cardiac Rehab

## 2023-01-16 ENCOUNTER — Encounter (HOSPITAL_COMMUNITY): Payer: Self-pay | Admitting: Pharmacist

## 2023-01-16 ENCOUNTER — Other Ambulatory Visit (HOSPITAL_COMMUNITY)
Admission: RE | Admit: 2023-01-16 | Discharge: 2023-01-16 | Disposition: A | Discharge status: Home / Self Care | Payer: Medicare PPO | Source: Ambulatory Visit | Attending: Cardiology | Admitting: Cardiology

## 2023-01-16 ENCOUNTER — Ambulatory Visit (HOSPITAL_COMMUNITY): Payer: Self-pay | Admitting: Pharmacist

## 2023-01-16 ENCOUNTER — Encounter (HOSPITAL_COMMUNITY)
Admission: RE | Admit: 2023-01-16 | Discharge: 2023-01-16 | Disposition: A | Discharge status: Home / Self Care | Payer: Medicare PPO | Source: Ambulatory Visit | Attending: Cardiology | Admitting: Cardiology

## 2023-01-16 DIAGNOSIS — Z95811 Presence of heart assist device: Secondary | ICD-10-CM

## 2023-01-16 DIAGNOSIS — I5023 Acute on chronic systolic (congestive) heart failure: Secondary | ICD-10-CM | POA: Diagnosis not present

## 2023-01-16 DIAGNOSIS — Z7901 Long term (current) use of anticoagulants: Secondary | ICD-10-CM | POA: Insufficient documentation

## 2023-01-16 LAB — PROTIME-INR
INR: 2.1 — ABNORMAL HIGH (ref 0.8–1.2)
Prothrombin Time: 23.9 seconds — ABNORMAL HIGH (ref 11.4–15.2)

## 2023-01-16 NOTE — Progress Notes (Signed)
Daily Session Note  Patient Details  Name: Ricardo Zuniga MRN: [id number] Date of Birth: 1951/05/23 Referring Provider:   Flowsheet Row CARDIAC REHAB PHASE II ORIENTATION from 11/04/2022 in [facility] CARDIAC REHABILITATION  Referring Provider Dr. Delia Chimes       Encounter Date: 01/16/2023  Check In:  Session Check In - 01/16/23 0815       Check-In   Supervising physician immediately available to respond to emergencies [facility] MD immediately available    Physician(s) Dr. Jenene Slicker    Location [facility]    Staff Present Erskine Speed, RN;Debra Laural Benes, RN, BSN;Other    Virtual Visit No    Medication changes reported     No    Fall or balance concerns reported    No    Tobacco Cessation No Change    Warm-up and Cool-down Performed as group-led instruction    Resistance Training Performed Yes    VAD Patient? Yes    PAD/SET Patient? No      VAD patient   Has back up controller? Yes    Has spare charged batteries? Yes    Has battery cables? Yes    Has compatible battery clips? Yes      Pain Assessment   Currently in Pain? No/denies    Multiple Pain Sites No             Capillary Blood Glucose: No results found for this or any previous visit (from the past 24 hour(s)).    Social History   Tobacco Use  Smoking Status Former   Packs/day: 2.00   Years: 17.00   Additional pack years: 0.00   Total pack years: 34.00   Types: Cigarettes   Start date: 02/17/1968   Quit date: 02/28/1986   Years since quitting: 36.9   Passive exposure: Never  Smokeless Tobacco Never    Goals Met:  Independence with exercise equipment Exercise tolerated well No report of concerns or symptoms today Strength training completed today  Goals Unmet:  Not Applicable  Comments: check out @ 9:15am   Dr. Dina Rich is Medical Director for [facility] Cardiac Re[facility]

## 2023-01-19 ENCOUNTER — Encounter (HOSPITAL_COMMUNITY): Admission: RE | Admit: 2023-01-19 | Payer: Medicare PPO | Source: Ambulatory Visit

## 2023-01-20 ENCOUNTER — Other Ambulatory Visit (HOSPITAL_COMMUNITY): Payer: Self-pay

## 2023-01-20 DIAGNOSIS — Z7901 Long term (current) use of anticoagulants: Secondary | ICD-10-CM

## 2023-01-20 DIAGNOSIS — Z95811 Presence of heart assist device: Secondary | ICD-10-CM

## 2023-01-21 ENCOUNTER — Encounter (HOSPITAL_COMMUNITY): Payer: Medicare PPO

## 2023-01-23 ENCOUNTER — Encounter (HOSPITAL_COMMUNITY): Payer: Medicare PPO

## 2023-01-25 ENCOUNTER — Other Ambulatory Visit: Payer: Self-pay

## 2023-01-25 ENCOUNTER — Emergency Department (HOSPITAL_COMMUNITY)
Admission: EM | Admit: 2023-01-25 | Discharge: 2023-01-25 | Disposition: A | Discharge status: Home / Self Care | Payer: Medicare PPO | Attending: Emergency Medicine | Admitting: Emergency Medicine

## 2023-01-25 ENCOUNTER — Emergency Department (HOSPITAL_COMMUNITY): Payer: Medicare PPO

## 2023-01-25 ENCOUNTER — Encounter (HOSPITAL_COMMUNITY): Payer: Self-pay | Admitting: Emergency Medicine

## 2023-01-25 DIAGNOSIS — R109 Unspecified abdominal pain: Secondary | ICD-10-CM | POA: Diagnosis not present

## 2023-01-25 DIAGNOSIS — K5730 Diverticulosis of large intestine without perforation or abscess without bleeding: Secondary | ICD-10-CM | POA: Diagnosis not present

## 2023-01-25 DIAGNOSIS — Z7901 Long term (current) use of anticoagulants: Secondary | ICD-10-CM | POA: Insufficient documentation

## 2023-01-25 DIAGNOSIS — N200 Calculus of kidney: Secondary | ICD-10-CM | POA: Diagnosis not present

## 2023-01-25 LAB — CBC WITH DIFFERENTIAL/PLATELET
Abs Immature Granulocytes: 0.02 10*3/uL (ref 0.00–0.07)
Basophils Absolute: 0 10*3/uL (ref 0.0–0.1)
Basophils Relative: 1 %
Eosinophils Absolute: 0.1 10*3/uL (ref 0.0–0.5)
Eosinophils Relative: 1 %
HCT: 39.2 % (ref 39.0–52.0)
Hemoglobin: 13.1 g/dL (ref 13.0–17.0)
Immature Granulocytes: 0 %
Lymphocytes Relative: 23 %
Lymphs Abs: 1.4 10*3/uL (ref 0.7–4.0)
MCH: 27.4 pg (ref 26.0–34.0)
MCHC: 33.4 g/dL (ref 30.0–36.0)
MCV: 82 fL (ref 80.0–100.0)
Monocytes Absolute: 0.6 10*3/uL (ref 0.1–1.0)
Monocytes Relative: 9 %
Neutro Abs: 4.1 10*3/uL (ref 1.7–7.7)
Neutrophils Relative %: 66 %
Platelets: 197 10*3/uL (ref 150–400)
RBC: 4.78 MIL/uL (ref 4.22–5.81)
RDW: 15.8 % — ABNORMAL HIGH (ref 11.5–15.5)
WBC: 6.2 10*3/uL (ref 4.0–10.5)
nRBC: 0 % (ref 0.0–0.2)

## 2023-01-25 LAB — URINALYSIS, W/ REFLEX TO CULTURE (INFECTION SUSPECTED)
Bacteria, UA: NONE SEEN
Bilirubin Urine: NEGATIVE
Glucose, UA: >=500 mg/dL — AB
Ketones, ur: NEGATIVE mg/dL
Leukocytes,Ua: NEGATIVE
Nitrite: NEGATIVE
Protein, ur: NEGATIVE mg/dL
Specific Gravity, Urine: 1.028 (ref 1.005–1.030)
pH: 5 (ref 5.0–8.0)

## 2023-01-25 LAB — PROTIME-INR
INR: 2 — ABNORMAL HIGH (ref 0.8–1.2)
Prothrombin Time: 23.3 seconds — ABNORMAL HIGH (ref 11.4–15.2)

## 2023-01-25 LAB — BASIC METABOLIC PANEL
Anion gap: 10 (ref 5–15)
BUN: 22 mg/dL (ref 8–23)
CO2: 21 mmol/L — ABNORMAL LOW (ref 22–32)
Calcium: 9.1 mg/dL (ref 8.9–10.3)
Chloride: 103 mmol/L (ref 98–111)
Creatinine, Ser: 0.72 mg/dL (ref 0.61–1.24)
GFR, Estimated: >60 mL/min (ref >60)
Glucose, Bld: 225 mg/dL — ABNORMAL HIGH (ref 70–99)
Potassium: 3.9 mmol/L (ref 3.5–5.1)
Sodium: 134 mmol/L — ABNORMAL LOW (ref 135–145)

## 2023-01-25 MED ORDER — OXYCODONE-ACETAMINOPHEN 5-325 MG PO TABS: 1.0000 | ORAL_TABLET | Freq: Four times a day (QID) | ORAL | 0 refills | Status: DC | PRN

## 2023-01-25 MED ORDER — MORPHINE SULFATE (PF) 4 MG/ML IV SOLN
4.0000 mg | Freq: Once | INTRAVENOUS | Status: AC
  Administered 2023-01-25: 4 mg via INTRAVENOUS
  Filled 2023-01-25: qty 1

## 2023-01-25 MED ORDER — ONDANSETRON HCL 4 MG/2ML IJ SOLN
4.0000 mg | Freq: Once | INTRAMUSCULAR | Status: AC
  Administered 2023-01-25: 4 mg via INTRAVENOUS
  Filled 2023-01-25: qty 2

## 2023-01-25 NOTE — ED Provider Notes (Signed)
[place] EMERGENCY DEPARTMENT AT [facility] Provider Note   CSN: [id number] Arrival date & time: 01/25/23  0038     History  Chief Complaint  Patient presents with   Back Pain    Ricardo Zuniga is a 72 y.o. male.  Patient presents to the emergency department for evaluation of left flank pain.  Pain began yesterday, worsened tonight.  He tried to take tramadol tonight but it did not help.  Patient reports that this pain is very similar to numerous kidney stones that he has had in the past.       Home Medications Prior to Admission medications   Medication Sig Start Date End Date Taking? Authorizing Provider  oxyCODONE-acetaminophen (PERCOCET/ROXICET) 5-325 MG tablet Take 1 tablet by mouth every 6 (six) hours as needed for severe pain. 01/25/23  Yes Malisa Ruggiero, Canary Brim, MD  acetaminophen (TYLENOL) 650 MG CR tablet Take 650-1,300 mg by mouth every 8 (eight) hours as needed for pain.    [provider]  atorvastatin (LIPITOR) 40 MG tablet Take 1 tablet by mouth once daily 10/13/22   Jonelle Sidle, MD  Cholecalciferol (VITAMIN D3) 125 MCG (5000 UT) TABS Take 5,000 Units by mouth in the morning.    [provider]  Digoxin 62.5 MCG TABS Take 1 tablet by mouth daily. 10/14/22   Sabharwal, Aditya, DO  docusate sodium (COLACE) 100 MG capsule Take 200 mg by mouth every evening.    [provider]  empagliflozin (JARDIANCE) 10 MG TABS tablet Take 10 mg by mouth every evening.    [provider]  enoxaparin (LOVENOX) 40 MG/0.4ML injection Inject 0.4 mLs (40 mg total) into the skin every 12 (twelve) hours for 10 doses. Take for 2 days, then await further instructions by the clinic 01/12/23 01/17/23  Sabharwal, Aditya, DO  furosemide (LASIX) 20 MG tablet Take 1 tablet (20 mg total) by mouth every other day. 12/25/22   Sabharwal, Aditya, DO  omeprazole (PRILOSEC) 20 MG capsule Take 20 mg by mouth daily before breakfast.    [provider]   potassium chloride SA (KLOR-CON M) 20 MEQ tablet Take 1 tablet (20 mEq total) by mouth daily. 12/01/22   Sabharwal, Aditya, DO  repaglinide (PRANDIN) 2 MG tablet Take 2-4 mg by mouth See admin instructions. Take 2 tablets (4 mg) by mouth in the morning & take 1 tablet (2 mg) by mouth in the evening. 07/06/17   [provider]  traZODone (DESYREL) 100 MG tablet Take 1 tablet (100 mg total) by mouth at bedtime as needed for sleep. 10/07/22   Alen Bleacher, NP  warfarin (COUMADIN) 5 MG tablet Take 2.5 mg (1/2 tab) every Tuesday/Saturday and 5 mg (1 tab) all other days or as directed by HF Clinic 11/11/22   Sabharwal, Eliezer Lofts, DO      Allergies    Zestril [lisinopril]    Review of Systems   Review of Systems  Physical Exam Updated Vital Signs BP (!) 120/104   Pulse 72   Temp (!) 97.4 F (36.3 C) (Oral)   Resp 14   Ht 5' 9.5" (1.765 m)   Wt 78.9 kg   SpO2 96%   BMI 25.33 kg/m  Physical Exam Vitals and nursing note reviewed.  Constitutional:      General: He is not in acute distress.    Appearance: He is well-developed.  HENT:     Head: Normocephalic and atraumatic.     Mouth/Throat:  Mouth: Mucous membranes are moist.  Eyes:     General: Vision grossly intact. Gaze aligned appropriately.     Extraocular Movements: Extraocular movements intact.     Conjunctiva/sclera: Conjunctivae normal.  Cardiovascular:     Heart sounds: S1 normal and S2 normal.     Comments: LVAD present Pulmonary:     Effort: Pulmonary effort is normal. No respiratory distress.     Breath sounds: Normal breath sounds.  Abdominal:     Palpations: Abdomen is soft.     Tenderness: There is no abdominal tenderness. There is no guarding or rebound.     Hernia: No hernia is present.  Musculoskeletal:        General: No swelling.     Cervical back: Full passive range of motion without pain, normal range of motion and neck supple. No pain with movement, spinous process tenderness or muscular tenderness.  Normal range of motion.     Right lower leg: No edema.     Left lower leg: No edema.  Skin:    General: Skin is warm and dry.     Capillary Refill: Capillary refill takes less than 2 seconds.     Findings: No ecchymosis, erythema, lesion or wound.  Neurological:     Mental Status: He is alert and oriented to person, place, and time.     GCS: GCS eye subscore is 4. GCS verbal subscore is 5. GCS motor subscore is 6.     Cranial Nerves: Cranial nerves 2-12 are intact.     Sensory: Sensation is intact.     Motor: Motor function is intact. No weakness or abnormal muscle tone.     Coordination: Coordination is intact.  Psychiatric:        Mood and Affect: Mood normal.        Speech: Speech normal.        Behavior: Behavior normal.     ED Results / Procedures / Treatments   Labs (all labs ordered are listed, but only abnormal results are displayed) Labs Reviewed  CBC WITH DIFFERENTIAL/PLATELET - Abnormal; Notable for the following components:      Result Value   RDW 15.8 (*)    All other components within normal limits  BASIC METABOLIC PANEL - Abnormal; Notable for the following components:   Sodium 134 (*)    CO2 21 (*)    Glucose, Bld 225 (*)    All other components within normal limits  PROTIME-INR - Abnormal; Notable for the following components:   Prothrombin Time 23.3 (*)    INR 2.0 (*)    All other components within normal limits  URINALYSIS, W/ REFLEX TO CULTURE (INFECTION SUSPECTED) - Abnormal; Notable for the following components:   Color, Urine STRAW (*)    Glucose, UA >=500 (*)    Hgb urine dipstick SMALL (*)    All other components within normal limits    EKG None  Radiology CT RENAL STONE STUDY  Result Date: 01/25/2023 CLINICAL DATA:  Left side back pain, flank pain EXAM: CT ABDOMEN AND PELVIS WITHOUT CONTRAST TECHNIQUE: Multidetector CT imaging of the abdomen and pelvis was performed following the standard protocol without IV contrast. RADIATION DOSE  REDUCTION: This exam was performed according to the departmental dose-optimization program which includes automated exposure control, adjustment of the mA and/or kV according to patient size and/or use of iterative reconstruction technique. COMPARISON:  09/02/2022 FINDINGS: Lower chest: LV 80 in place.  No acute abnormality. Hepatobiliary: No focal liver abnormality is  seen. Status post cholecystectomy. No biliary dilatation. Pancreas: No focal abnormality or ductal dilatation. Spleen: No focal abnormality.  Normal size. Adrenals/Urinary Tract: Multiple small bilateral nonobstructing renal stones. No ureteral stones or hydronephrosis. Adrenal glands and urinary bladder unremarkable. Stomach/Bowel: Sigmoid diverticulosis. No active diverticulitis. Stomach and small bowel decompressed, unremarkable. Moderate stool burden throughout the colon. Vascular/Lymphatic: Aortic atherosclerosis. No evidence of aneurysm or adenopathy. Reproductive: No visible focal abnormality. Other: No free fluid or free air. Musculoskeletal: No acute bony abnormality. IMPRESSION: Bilateral nephrolithiasis.  No ureteral stones or hydronephrosis. Sigmoid diverticulosis. Aortic atherosclerosis. Electronically Signed   By: Charlett Nose M.D.   On: 01/25/2023 02:27    Procedures Procedures    Medications Ordered in ED Medications  morphine (PF) 4 MG/ML injection 4 mg (4 mg Intravenous Given 01/25/23 0218)  ondansetron (ZOFRAN) injection 4 mg (4 mg Intravenous Given 01/25/23 0216)    ED Course/ Medical Decision Making/ A&P                             Medical Decision Making Amount and/or Complexity of Data Reviewed Labs: ordered. Radiology: ordered.  Risk Prescription drug management.   Presents to the emergency department for evaluation of left-sided flank pain.  Patient reports a history of countless prior kidney stones and this felt similar.  Pain was somewhat sudden onset, left flank area.  No urinary symptoms.  Patient  is an LVAD patient.  He is on Coumadin.  No significant hematuria prehospital.  Workup has been reassuring.  He is afebrile.  No leukocytosis.  Hemoglobin is normal, at baseline.  INR is 2.0.  Urinalysis without signs of infection.  No significant microscopic hematuria and renal stone study negative.  Patient reassured he is currently pain-free.  Consistent with musculoskeletal pain versus spontaneously passed a small ureteral stone.        Final Clinical Impression(s) / ED Diagnoses Final diagnoses:  Flank pain    Rx / DC Orders ED Discharge Orders          Ordered    oxyCODONE-acetaminophen (PERCOCET/ROXICET) 5-325 MG tablet  Every 6 hours PRN        01/25/23 0416              Gilda Crease, MD 01/25/23 [phone]

## 2023-01-25 NOTE — ED Notes (Signed)
Patient transported to CT 

## 2023-01-25 NOTE — ED Triage Notes (Signed)
Pt c/o left sided back pain that started yesterday. Denies pain when urinating or blood in urine. Hx of kidney stones and pt states it feels the same. LVAD patient since Feb 2024.   50mg  tramdol taken at 8pm.

## 2023-01-26 ENCOUNTER — Encounter (HOSPITAL_COMMUNITY)
Admission: RE | Admit: 2023-01-26 | Discharge: 2023-01-26 | Disposition: A | Discharge status: Home / Self Care | Payer: Medicare PPO | Source: Ambulatory Visit | Attending: Cardiology | Admitting: Cardiology

## 2023-01-26 VITALS — Wt 181.7 lb

## 2023-01-26 DIAGNOSIS — I5023 Acute on chronic systolic (congestive) heart failure: Secondary | ICD-10-CM | POA: Diagnosis not present

## 2023-01-26 DIAGNOSIS — Z95811 Presence of heart assist device: Secondary | ICD-10-CM

## 2023-01-26 NOTE — Progress Notes (Signed)
Daily Session Note  Patient Details  Name: Ricardo Zuniga MRN: [id number] Date of Birth: 1951/05/08 Referring Provider:   Flowsheet Row CARDIAC REHAB PHASE II ORIENTATION from 11/04/2022 in [facility] CARDIAC REHABILITATION  Referring Provider Dr. Delia Chimes       Encounter Date: 01/26/2023  Check In:  Session Check In - 01/26/23 0815       Check-In   Supervising physician immediately available to respond to emergencies [facility] MD immediately available    Physician(s) Dr. Tenny Craw    Location [facility]    Staff Present Ross Ludwig, BS, Exercise Physiologist;Debra Laural Benes, RN, BSN    Virtual Visit No    Medication changes reported     No    Tobacco Cessation No Change    Warm-up and Cool-down Performed as group-led instruction    Resistance Training Performed Yes    VAD Patient? Yes    PAD/SET Patient? No      VAD patient   Has back up controller? Yes    Has spare charged batteries? Yes    Has battery cables? Yes    Has compatible battery clips? Yes      Pain Assessment   Currently in Pain? No/denies    Pain Score 0-No pain    Multiple Pain Sites No             Capillary Blood Glucose: No results found for this or any previous visit (from the past 24 hour(s)).    Social History   Tobacco Use  Smoking Status Former   Packs/day: 2.00   Years: 17.00   Additional pack years: 0.00   Total pack years: 34.00   Types: Cigarettes   Start date: 02/17/1968   Quit date: 02/28/1986   Years since quitting: 36.9   Passive exposure: Never  Smokeless Tobacco Never    Goals Met:  Independence with exercise equipment Exercise tolerated well No report of concerns or symptoms today Strength training completed today  Goals Unmet:  Not Applicable  Comments: check out 0915   Dr. Dina Rich is Medical Director for [facility] Cardiac Rehab

## 2023-01-28 ENCOUNTER — Ambulatory Visit (HOSPITAL_COMMUNITY): Payer: Self-pay | Admitting: Pharmacist

## 2023-01-28 ENCOUNTER — Encounter (HOSPITAL_COMMUNITY)
Admission: RE | Admit: 2023-01-28 | Discharge: 2023-01-28 | Disposition: A | Discharge status: Home / Self Care | Payer: Medicare PPO | Source: Ambulatory Visit | Attending: Cardiology | Admitting: Cardiology

## 2023-01-28 DIAGNOSIS — Z95811 Presence of heart assist device: Secondary | ICD-10-CM

## 2023-01-28 DIAGNOSIS — I5023 Acute on chronic systolic (congestive) heart failure: Secondary | ICD-10-CM | POA: Diagnosis not present

## 2023-01-28 NOTE — Progress Notes (Signed)
Cardiac Individual Treatment Plan  Patient Details  Name: Ricardo Zuniga MRN: [id number] Date of Birth: 12-Dec-1950 Referring Provider:   Flowsheet Row CARDIAC REHAB PHASE II ORIENTATION from 11/04/2022 in [facility] CARDIAC REHABILITATION  Referring Provider Dr. Delia Chimes       Initial Encounter Date:  Flowsheet Row CARDIAC REHAB PHASE II ORIENTATION from 11/04/2022 in [place] CARDIAC REHABILITATION  Date 11/04/22       Visit Diagnosis: Acute on chronic systolic (congestive) heart failure (HCC)  LVAD (left ventricular assist device) present (HCC)  Patient's Home Medications on Admission:  Current Outpatient Medications:    acetaminophen (TYLENOL) 650 MG CR tablet, Take 650-1,300 mg by mouth every 8 (eight) hours as needed for pain., Disp: , Rfl:    atorvastatin (LIPITOR) 40 MG tablet, Take 1 tablet by mouth once daily, Disp: 90 tablet, Rfl: 1   Cholecalciferol (VITAMIN D3) 125 MCG (5000 UT) TABS, Take 5,000 Units by mouth in the morning., Disp: , Rfl:    Digoxin 62.5 MCG TABS, Take 1 tablet by mouth daily., Disp: 30 tablet, Rfl: 5   docusate sodium (COLACE) 100 MG capsule, Take 200 mg by mouth every evening., Disp: , Rfl:    empagliflozin (JARDIANCE) 10 MG TABS tablet, Take 10 mg by mouth every evening., Disp: , Rfl:    enoxaparin (LOVENOX) 40 MG/0.4ML injection, Inject 0.4 mLs (40 mg total) into the skin every 12 (twelve) hours for 10 doses. Take for 2 days, then await further instructions by the clinic, Disp: 4 mL, Rfl: 0   furosemide (LASIX) 20 MG tablet, Take 1 tablet (20 mg total) by mouth every other day., Disp: 90 tablet, Rfl: 3   omeprazole (PRILOSEC) 20 MG capsule, Take 20 mg by mouth daily before breakfast., Disp: , Rfl:    oxyCODONE-acetaminophen (PERCOCET/ROXICET) 5-325 MG tablet, Take 1 tablet by mouth every 6 (six) hours as needed for severe pain., Disp: 6 tablet, Rfl: 0   potassium chloride SA (KLOR-CON M) 20 MEQ tablet, Take 1 tablet (20 mEq total) by mouth daily.,  Disp: 90 tablet, Rfl: 3   repaglinide (PRANDIN) 2 MG tablet, Take 2-4 mg by mouth See admin instructions. Take 2 tablets (4 mg) by mouth in the morning & take 1 tablet (2 mg) by mouth in the evening., Disp: , Rfl:    traZODone (DESYREL) 100 MG tablet, Take 1 tablet (100 mg total) by mouth at bedtime as needed for sleep., Disp: 30 tablet, Rfl: 3   warfarin (COUMADIN) 5 MG tablet, Take 2.5 mg (1/2 tab) every Tuesday/Saturday and 5 mg (1 tab) all other days or as directed by HF Clinic, Disp: 90 tablet, Rfl: 11  Past Medical History: Past Medical History:  Diagnosis Date   Anal fissure    APML (acute promyelocytic leukemia) in remission (HCC)    Completed treatment 04/2013   CHF (congestive heart failure) (HCC)    Coronary artery disease    Nonobstructive at cardiac catheterization 09/2018   Difficult intubation    w/shoulder surgery at surgical center between 2005-2008   Gastroesophageal reflux disease    History of blood transfusion 2014   History of kidney stones    passed stones   Hypercholesteremia    Neuropathy    bilateral feet   Nonischemic cardiomyopathy (HCC)    a. EF 20% in 2015 - thought to possibly be viral induced or chemotherapy induced from treatments in 2014 b. at 35-40% by echo in 05/2016    Peptic ulcer disease    Pneumonia  Yrs ago   Type 2 diabetes mellitus (HCC)    Ureteral colic    Vertigo    tx with valium prn   Wears glasses    Wears hearing aid in both ears     Tobacco Use: Social History   Tobacco Use  Smoking Status Former   Packs/day: 2.00   Years: 17.00   Additional pack years: 0.00   Total pack years: 34.00   Types: Cigarettes   Start date: 02/17/1968   Quit date: 02/28/1986   Years since quitting: 36.9   Passive exposure: Never  Smokeless Tobacco Never    Labs: Review Flowsheet  More data exists      Latest Ref Rng & Units 09/29/2022 09/30/2022 10/01/2022 10/02/2022 10/03/2022  Labs for ITP Cardiac and Pulmonary Rehab  O2 Saturation %  78.8  82.4  74.3  79.5  68.1  56  58     Capillary Blood Glucose: Lab Results  Component Value Date   GLUCAP 201 (H) 12/01/2022   GLUCAP 139 (H) 11/10/2022   GLUCAP 145 (H) 11/04/2022   GLUCAP 151 (H) 10/07/2022   GLUCAP 168 (H) 10/06/2022    POCT Glucose     Row Name 11/04/22 0944             POCT Blood Glucose   Pre-Exercise 145 mg/dL                Exercise Target Goals: Exercise Program Goal: Individual exercise prescription set using results from initial 6 min walk test and THRR while considering  patient's activity barriers and safety.   Exercise Prescription Goal: Starting with aerobic activity 30 plus minutes a day, 3 days per week for initial exercise prescription. Provide home exercise prescription and guidelines that participant acknowledges understanding prior to discharge.  Activity Barriers & Risk Stratification:  Activity Barriers & Cardiac Risk Stratification - 11/04/22 0905       Activity Barriers & Cardiac Risk Stratification   Activity Barriers Arthritis;Joint Problems;Decreased Ventricular Function    Cardiac Risk Stratification High             6 Minute Walk:  6 Minute Walk     Row Name 11/04/22 1009         6 Minute Walk   Phase Initial     Distance 1100 feet     Walk Time 6 minutes     # of Rest Breaks 1     MPH 2.08     METS 2.39     RPE 12     VO2 Peak 8.36     Symptoms Yes (comment)     Comments one sitting break 1 minute due to fatigue     Resting HR 82 bpm     Resting BP --  78 MAP     Resting Oxygen Saturation  96 %     Exercise Oxygen Saturation  during 6 min walk 99 %     Max Ex. HR 111 bpm     Max Ex. BP --  90 MAP     2 Minute Post BP --  80 MAP              Oxygen Initial Assessment:   Oxygen Re-Evaluation:   Oxygen Discharge (Final Oxygen Re-Evaluation):   Initial Exercise Prescription:  Initial Exercise Prescription - 11/04/22 1000       Date of Initial Exercise RX and Referring Provider    Date 11/04/22    Referring Provider Dr.  Enter    Expected Discharge Date 01/30/23      NuStep   Level 1    SPM 65    Minutes 39      Prescription Details   Frequency (times per week) 3    Duration Progress to 30 minutes of continuous aerobic without signs/symptoms of physical distress      Intensity   THRR 40-80% of Max Heartrate 59-118    Ratings of Perceived Exertion 11-13      Resistance Training   Training Prescription Yes    Weight 2    Reps 10-15             Perform Capillary Blood Glucose checks as needed.  Exercise Prescription Changes:   Exercise Prescription Changes     Row Name 11/17/22 1000 12/01/22 1300 12/15/22 1200 12/29/22 1100 01/12/23 1200     Response to Exercise   Blood Pressure (Admit) --  98 MAP --  80 MAP --  88 MAP --  96 MAP --  88 MAP   Blood Pressure (Exercise) --  92 MAP --  78 MAP --  90 MAP --  98 MAP --  100 MAP   Blood Pressure (Exit) --  76 MAP --  76 MAP --  90 MAP --  94 MAP --   Heart Rate (Admit) 84 bpm 101 bpm 108 bpm 104 bpm 108 bpm   Heart Rate (Exercise) 107 bpm 125 bpm 135 bpm 128 bpm 131 bpm   Heart Rate (Exit) 90 bpm 93 bpm 118 bpm 113 bpm 108 bpm   Rating of Perceived Exertion (Exercise) 12 12 12 11 11    Duration Continue with 30 min of aerobic exercise without signs/symptoms of physical distress. Continue with 30 min of aerobic exercise without signs/symptoms of physical distress. Continue with 30 min of aerobic exercise without signs/symptoms of physical distress. Continue with 30 min of aerobic exercise without signs/symptoms of physical distress. Continue with 30 min of aerobic exercise without signs/symptoms of physical distress.   Intensity THRR unchanged THRR unchanged THRR unchanged THRR unchanged THRR unchanged     Progression   Progression Continue to progress workloads to maintain intensity without signs/symptoms of physical distress. Continue to progress workloads to maintain intensity without signs/symptoms  of physical distress. Continue to progress workloads to maintain intensity without signs/symptoms of physical distress. Continue to progress workloads to maintain intensity without signs/symptoms of physical distress. Continue to progress workloads to maintain intensity without signs/symptoms of physical distress.     Resistance Training   Training Prescription Yes Yes Yes Yes Yes   Weight 4 4 4 4 4    Reps 10-15 10-15 10-15 10-15 10-15   Time 10 Minutes 10 Minutes 10 Minutes 10 Minutes 10 Minutes     NuStep   Level 2 2 2 2 2    SPM 118 120 114 115 124   Minutes 39 39 39 39 39   METs 4.2 3.75 3.73 3.63 4.36    Row Name 01/26/23 1100             Response to Exercise   Blood Pressure (Admit) --  88 MAP       Blood Pressure (Exercise) --  88 MAP       Blood Pressure (Exit) --  88 MAP       Heart Rate (Admit) 110 bpm       Heart Rate (Exercise) 117 bpm       Heart Rate (Exit) 107 bpm  Rating of Perceived Exertion (Exercise) 12       Duration Continue with 30 min of aerobic exercise without signs/symptoms of physical distress.       Intensity THRR unchanged         Progression   Progression Continue to progress workloads to maintain intensity without signs/symptoms of physical distress.         Resistance Training   Training Prescription Yes       Weight 4       Reps 10-15       Time 10 Minutes         NuStep   Level 2       SPM 115       Minutes 39       METs 3.83                Exercise Comments:   Exercise Goals and Review:   Exercise Goals     Row Name 11/04/22 1012 12/01/22 1320 12/29/22 1146 01/26/23 1153       Exercise Goals   Increase Physical Activity Yes Yes Yes Yes    Intervention Develop an individualized exercise prescription for aerobic and resistive training based on initial evaluation findings, risk stratification, comorbidities and participant's personal goals.;Provide advice, education, support and counseling about physical  activity/exercise needs. Develop an individualized exercise prescription for aerobic and resistive training based on initial evaluation findings, risk stratification, comorbidities and participant's personal goals.;Provide advice, education, support and counseling about physical activity/exercise needs. Develop an individualized exercise prescription for aerobic and resistive training based on initial evaluation findings, risk stratification, comorbidities and participant's personal goals.;Provide advice, education, support and counseling about physical activity/exercise needs. Develop an individualized exercise prescription for aerobic and resistive training based on initial evaluation findings, risk stratification, comorbidities and participant's personal goals.;Provide advice, education, support and counseling about physical activity/exercise needs.    Expected Outcomes Short Term: Attend rehab on a regular basis to increase amount of physical activity.;Long Term: Add in home exercise to make exercise part of routine and to increase amount of physical activity.;Long Term: Exercising regularly at least 3-5 days a week. Short Term: Attend rehab on a regular basis to increase amount of physical activity.;Long Term: Add in home exercise to make exercise part of routine and to increase amount of physical activity.;Long Term: Exercising regularly at least 3-5 days a week. Short Term: Attend rehab on a regular basis to increase amount of physical activity.;Long Term: Add in home exercise to make exercise part of routine and to increase amount of physical activity.;Long Term: Exercising regularly at least 3-5 days a week. Short Term: Attend rehab on a regular basis to increase amount of physical activity.;Long Term: Add in home exercise to make exercise part of routine and to increase amount of physical activity.;Long Term: Exercising regularly at least 3-5 days a week.    Increase Strength and Stamina Yes Yes Yes Yes     Intervention Provide advice, education, support and counseling about physical activity/exercise needs.;Develop an individualized exercise prescription for aerobic and resistive training based on initial evaluation findings, risk stratification, comorbidities and participant's personal goals. Provide advice, education, support and counseling about physical activity/exercise needs.;Develop an individualized exercise prescription for aerobic and resistive training based on initial evaluation findings, risk stratification, comorbidities and participant's personal goals. Provide advice, education, support and counseling about physical activity/exercise needs.;Develop an individualized exercise prescription for aerobic and resistive training based on initial evaluation findings, risk stratification, comorbidities and participant's personal goals.  Provide advice, education, support and counseling about physical activity/exercise needs.;Develop an individualized exercise prescription for aerobic and resistive training based on initial evaluation findings, risk stratification, comorbidities and participant's personal goals.    Expected Outcomes Short Term: Increase workloads from initial exercise prescription for resistance, speed, and METs.;Short Term: Perform resistance training exercises routinely during rehab and add in resistance training at home;Long Term: Improve cardiorespiratory fitness, muscular endurance and strength as measured by increased METs and functional capacity ( ) Short Term: Increase workloads from initial exercise prescription for resistance, speed, and METs.;Short Term: Perform resistance training exercises routinely during rehab and add in resistance training at home;Long Term: Improve cardiorespiratory fitness, muscular endurance and strength as measured by increased METs and functional capacity ( ) Short Term: Increase workloads from initial exercise prescription for resistance, speed,  and METs.;Short Term: Perform resistance training exercises routinely during rehab and add in resistance training at home;Long Term: Improve cardiorespiratory fitness, muscular endurance and strength as measured by increased METs and functional capacity ( ) Short Term: Increase workloads from initial exercise prescription for resistance, speed, and METs.;Short Term: Perform resistance training exercises routinely during rehab and add in resistance training at home;Long Term: Improve cardiorespiratory fitness, muscular endurance and strength as measured by increased METs and functional capacity ( )    Able to understand and use rate of perceived exertion (RPE) scale Yes Yes Yes Yes    Intervention Provide education and explanation on how to use RPE scale Provide education and explanation on how to use RPE scale Provide education and explanation on how to use RPE scale Provide education and explanation on how to use RPE scale    Expected Outcomes Short Term: Able to use RPE daily in rehab to express subjective intensity level;Long Term:  Able to use RPE to guide intensity level when exercising independently Short Term: Able to use RPE daily in rehab to express subjective intensity level;Long Term:  Able to use RPE to guide intensity level when exercising independently Short Term: Able to use RPE daily in rehab to express subjective intensity level;Long Term:  Able to use RPE to guide intensity level when exercising independently Short Term: Able to use RPE daily in rehab to express subjective intensity level;Long Term:  Able to use RPE to guide intensity level when exercising independently    Knowledge and understanding of Target Heart Rate Range (THRR) Yes Yes Yes Yes    Intervention Provide education and explanation of THRR including how the numbers were predicted and where they are located for reference Provide education and explanation of THRR including how the numbers were predicted and where they are  located for reference Provide education and explanation of THRR including how the numbers were predicted and where they are located for reference Provide education and explanation of THRR including how the numbers were predicted and where they are located for reference    Expected Outcomes Short Term: Able to state/look up THRR;Short Term: Able to use daily as guideline for intensity in rehab;Long Term: Able to use THRR to govern intensity when exercising independently Short Term: Able to state/look up THRR;Short Term: Able to use daily as guideline for intensity in rehab;Long Term: Able to use THRR to govern intensity when exercising independently Short Term: Able to state/look up THRR;Short Term: Able to use daily as guideline for intensity in rehab;Long Term: Able to use THRR to govern intensity when exercising independently Short Term: Able to state/look up THRR;Short Term: Able to use daily as guideline for intensity in rehab;Long Term:  Able to use THRR to govern intensity when exercising independently    Able to check pulse independently Yes Yes Yes Yes    Intervention Provide education and demonstration on how to check pulse in carotid and radial arteries.;Review the importance of being able to check your own pulse for safety during independent exercise Provide education and demonstration on how to check pulse in carotid and radial arteries.;Review the importance of being able to check your own pulse for safety during independent exercise Provide education and demonstration on how to check pulse in carotid and radial arteries.;Review the importance of being able to check your own pulse for safety during independent exercise Provide education and demonstration on how to check pulse in carotid and radial arteries.;Review the importance of being able to check your own pulse for safety during independent exercise    Expected Outcomes Short Term: Able to explain why pulse checking is important during  independent exercise;Long Term: Able to check pulse independently and accurately Short Term: Able to explain why pulse checking is important during independent exercise;Long Term: Able to check pulse independently and accurately Short Term: Able to explain why pulse checking is important during independent exercise;Long Term: Able to check pulse independently and accurately Short Term: Able to explain why pulse checking is important during independent exercise;Long Term: Able to check pulse independently and accurately    Understanding of Exercise Prescription Yes Yes Yes Yes    Intervention Provide education, explanation, and written materials on patient's individual exercise prescription Provide education, explanation, and written materials on patient's individual exercise prescription Provide education, explanation, and written materials on patient's individual exercise prescription Provide education, explanation, and written materials on patient's individual exercise prescription    Expected Outcomes Short Term: Able to explain program exercise prescription;Long Term: Able to explain home exercise prescription to exercise independently Short Term: Able to explain program exercise prescription;Long Term: Able to explain home exercise prescription to exercise independently Short Term: Able to explain program exercise prescription;Long Term: Able to explain home exercise prescription to exercise independently Short Term: Able to explain program exercise prescription;Long Term: Able to explain home exercise prescription to exercise independently             Exercise Goals Re-Evaluation :  Exercise Goals Re-Evaluation     Row Name 12/01/22 1320 12/29/22 1147 01/26/23 1154         Exercise Goal Re-Evaluation   Exercise Goals Review Increase Physical Activity;Increase Strength and Stamina;Able to understand and use rate of perceived exertion (RPE) scale;Able to check pulse independently;Knowledge and  understanding of Target Heart Rate Range (THRR);Understanding of Exercise Prescription Increase Physical Activity;Increase Strength and Stamina;Able to understand and use rate of perceived exertion (RPE) scale;Knowledge and understanding of Target Heart Rate Range (THRR);Able to check pulse independently;Understanding of Exercise Prescription Increase Physical Activity;Increase Strength and Stamina;Able to understand and use rate of perceived exertion (RPE) scale;Knowledge and understanding of Target Heart Rate Range (THRR);Able to check pulse independently;Understanding of Exercise Prescription     Comments Pt has completed 11 sessions of cardiac rehab. He is very egar to be in the program and is progressing/ tolerating exerise well. He is currently exercising at 3.75 METs on the stepper. Will continue to monitor and progress as able. Pt has completed 23 sessions of cardiac rehab. He remains egar to be in the program and is progressing well. He has had two dizzy spells during exercise and has stopped exercising. HE called LVAD clinic after and modifations were done. He is currently  exercising at 3.63 METs on the stepper. Will continue to monitor and progress as able. Pt has completed 31 session of caridac rehab. He continues to progress in the class and increasing his SPM each time. He is about to finish up the class in one week and has stated that he has seen an improvement and that the calss has helped him. He is currently exercising at 3.83 METs on the stepper. Will continue to monitor and progress as able .     Expected Outcomes Through exercise at rehab and home, the patient will meet their stated goals Through exercise at rehab and home, the patient will meet their stated goals Through exercise at rehab and home, the patient will meet their stated goals               Discharge Exercise Prescription (Final Exercise Prescription Changes):  Exercise Prescription Changes - 01/26/23 1100        Response to Exercise   Blood Pressure (Admit) --   88 MAP   Blood Pressure (Exercise) --   88 MAP   Blood Pressure (Exit) --   88 MAP   Heart Rate (Admit) 110 bpm    Heart Rate (Exercise) 117 bpm    Heart Rate (Exit) 107 bpm    Rating of Perceived Exertion (Exercise) 12    Duration Continue with 30 min of aerobic exercise without signs/symptoms of physical distress.    Intensity THRR unchanged      Progression   Progression Continue to progress workloads to maintain intensity without signs/symptoms of physical distress.      Resistance Training   Training Prescription Yes    Weight 4    Reps 10-15    Time 10 Minutes      NuStep   Level 2    SPM 115    Minutes 39    METs 3.83             Nutrition:  Target Goals: Understanding of nutrition guidelines, daily intake of sodium 1500mg , cholesterol 200mg , calories 30% from fat and 7% or less from saturated fats, daily to have 5 or more servings of fruits and vegetables.  Biometrics:  Pre Biometrics - 11/04/22 1013       Pre Biometrics   Height 5' 9.5" (1.765 m)    Weight 78.9 kg    Waist Circumference 36 inches    Hip Circumference 40 inches    Waist to Hip Ratio 0.9 %    BMI (Calculated) 25.33    Triceps Skinfold 10 mm    % Body Fat 23.2 %    Grip Strength 13.3 kg    Flexibility 0 in    Single Leg Stand 0 seconds              Nutrition Therapy Plan and Nutrition Goals:  Nutrition Therapy & Goals - 01/19/23 1232       Nutrition Therapy   RD appointment deferred Yes      Personal Nutrition Goals   Comments We provide educational sessions on heart heathy nutrition and assistance with RD referral if patient is interested.      Intervention Plan   Intervention Nutrition handout(s) given to patient.    Expected Outcomes Short Term Goal: Understand basic principles of dietary content, such as calories, fat, sodium, cholesterol and nutrients.             Nutrition Assessments:  Nutrition  Assessments - 11/04/22 0914       MEDFICTS  Scores   Pre Score 118            MEDIFICTS Score Key: ?70 Need to make dietary changes  40-70 Heart Healthy Diet ? 40 Therapeutic Level Cholesterol Diet   Picture Your Plate Scores: <44 Unhealthy dietary pattern with much room for improvement. 41-50 Dietary pattern unlikely to meet recommendations for good health and room for improvement. 51-60 More healthful dietary pattern, with some room for improvement.  >60 Healthy dietary pattern, although there may be some specific behaviors that could be improved.    Nutrition Goals Re-Evaluation:   Nutrition Goals Discharge (Final Nutrition Goals Re-Evaluation):   Psychosocial: Target Goals: Acknowledge presence or absence of significant depression and/or stress, maximize coping skills, provide positive support system. Participant is able to verbalize types and ability to use techniques and skills needed for reducing stress and depression.  Initial Review & Psychosocial Screening:  Initial Psych Review & Screening - 11/04/22 0911       Initial Review   Current issues with Current Sleep Concerns      Family Dynamics   Good Support System? Yes    Comments His wife and his children are his main support system.      Barriers   Psychosocial barriers to participate in program There are no identifiable barriers or psychosocial needs.      Screening Interventions   Interventions Encouraged to exercise;Provide feedback about the scores to participant    Expected Outcomes Long Term goal: The participant improves quality of Life and PHQ9 Scores as seen by post scores and/or verbalization of changes;Short Term goal: Identification and review with participant of any Quality of Life or Depression concerns found by scoring the questionnaire.             Quality of Life Scores:  Quality of Life - 11/04/22 1013       Quality of Life   Select Quality of Life      Quality of Life Scores    Health/Function Pre 28.5 %    Socioeconomic Pre 28.21 %    Psych/Spiritual Pre 29.64 %    Family Pre 30 %    GLOBAL Pre 28.91 %            Scores of 19 and below usually indicate a poorer quality of life in these areas.  A difference of  2-3 points is a clinically meaningful difference.  A difference of 2-3 points in the total score of the Quality of Life Index has been associated with significant improvement in overall quality of life, self-image, physical symptoms, and general health in studies assessing change in quality of life.  PHQ-9: Review Flowsheet       11/04/2022 09/03/2022  Depression screen PHQ 2/9  Decreased Interest 0 0  Down, Depressed, Hopeless 0 0  PHQ - 2 Score 0 0  Altered sleeping 0 -  Tired, decreased energy 1 -  Change in appetite 1 -  Feeling bad or failure about yourself  0 -  Trouble concentrating 0 -  Moving slowly or fidgety/restless 0 -  Suicidal thoughts 0 -  PHQ-9 Score 2 -  Difficult doing work/chores Not difficult at all -   Interpretation of Total Score  Total Score Depression Severity:  1-4 = Minimal depression, 5-9 = Mild depression, 10-14 = Moderate depression, 15-19 = Moderately severe depression, 20-27 = Severe depression   Psychosocial Evaluation and Intervention:  Psychosocial Evaluation - 11/04/22 0950       Psychosocial Evaluation &  Interventions   Interventions Relaxation education;Stress management education;Encouraged to exercise with the program and follow exercise prescription    Comments Pt has no barriers to participate in CR. He has no identifiable psychosocial issues. He does take Trazodone for sleep, and he reports that he was prescribed this previous to getting his LVAD. He states that he sleeps great at night. He scored a 2 on his PHQ-9 and this relates to his energy levels and his lack of appetite. His energy levels continue to improve as he recovers from his LVAD. His appetite is beginning to come back. He lost about  30 lbs while in the hospital during his LVAD implantation, but some of this was fluid weight. He states that he has been told by his doctors to try to put on weight. He has a very positive outlook and has adjusted well to his LVAD. He states that he has a good support system with his wife and his children. His goals while in the program are to increase his strength and stamina and to return to his usual yard work. He is eager to start the program and motivated to improve himself.    Expected Outcomes Pt's sleep will continue to be managed with Trazodone, and he will continue to have no other identifialble psychosocial issues.    Continue Psychosocial Services  No Follow up required             Psychosocial Re-Evaluation:  Psychosocial Re-Evaluation     Row Name 11/26/22 1250 12/24/22 0852 01/19/23 1232         Psychosocial Re-Evaluation   Current issues with None Identified None Identified None Identified     Comments Patient is new to the program. He has completed 7 sessions. He continues to have no psychosocial barriers identified. His sleep continues to be managed with Trazadone. He enjoys the sessions and demonstrates an interest in improving his health. We will continue to monitor his progress. Patient has completed 19 sessions. He continues to have no psychosocial barriers identified. His sleep continues to be managed with Trazadone. He continues to enjoy the sessions and demonstrates an interest in improving his health. We will continue to monitor his progress. Patient has completed 30 sessions. He continues to have no psychosocial barriers identified. His sleep continues to be managed with Trazadone. He continues to enjoy the sessions and demonstrates an interest in improving his health. We will continue to monitor his progress.     Expected Outcomes Patient will continue to have no psychosocial barriers identified and his sleep will continue to be managed with Trazadone. Patient will  continue to have no psychosocial barriers identified and his sleep will continue to be managed with Trazadone. Patient will continue to have no psychosocial barriers identified and his sleep will continue to be managed with Trazadone.     Interventions Stress management education;Relaxation education;Encouraged to attend Cardiac Rehabilitation for the exercise Stress management education;Relaxation education;Encouraged to attend Cardiac Rehabilitation for the exercise Stress management education;Relaxation education;Encouraged to attend Cardiac Rehabilitation for the exercise     Continue Psychosocial Services  No Follow up required No Follow up required No Follow up required              Psychosocial Discharge (Final Psychosocial Re-Evaluation):  Psychosocial Re-Evaluation - 01/19/23 1232       Psychosocial Re-Evaluation   Current issues with None Identified    Comments Patient has completed 30 sessions. He continues to have no psychosocial barriers identified. His sleep continues  to be managed with Trazadone. He continues to enjoy the sessions and demonstrates an interest in improving his health. We will continue to monitor his progress.    Expected Outcomes Patient will continue to have no psychosocial barriers identified and his sleep will continue to be managed with Trazadone.    Interventions Stress management education;Relaxation education;Encouraged to attend Cardiac Rehabilitation for the exercise    Continue Psychosocial Services  No Follow up required             Vocational Rehabilitation: Provide vocational rehab assistance to qualifying candidates.   Vocational Rehab Evaluation & Intervention:  Vocational Rehab - 11/04/22 0919       Initial Vocational Rehab Evaluation & Intervention   Assessment shows need for Vocational Rehabilitation No      Vocational Rehab Re-Evaulation   Comments retired             Education: Education Goals: Education classes will be  provided on a weekly basis, covering required topics. Participant will state understanding/return demonstration of topics presented.  Learning Barriers/Preferences:  Learning Barriers/Preferences - 11/04/22 0919       Learning Barriers/Preferences   Learning Barriers Hearing    Learning Preferences Audio;Pictoral;Written Material             Education Topics: Hypertension, Hypertension Reduction -Define heart disease and high blood pressure. Discus how high blood pressure affects the body and ways to reduce high blood pressure.   Exercise and Your Heart -Discuss why it is important to exercise, the FITT principles of exercise, normal and abnormal responses to exercise, and how to exercise safely. Flowsheet Row CARDIAC REHAB PHASE II EXERCISE from 01/14/2023 in [place] CARDIAC RE[facility]ATION  Date 11/26/22  Educator HB  Instruction Review Code 1- Verbalizes Understanding       Angina -Discuss definition of angina, causes of angina, treatment of angina, and how to decrease risk of having angina. Flowsheet Row CARDIAC REHAB PHASE II EXERCISE from 01/14/2023 in [place] CARDIAC REHABILITATION  Date 12/03/22  Educator HB  Instruction Review Code 2- Demonstrated Understanding       Cardiac Medications -Review what the following cardiac medications are used for, how they affect the body, and side effects that may occur when taking the medications.  Medications include Aspirin, Beta blockers, calcium channel blockers, ACE Inhibitors, angiotensin receptor blockers, diuretics, digoxin, and antihyperlipidemics.   Congestive Heart Failure -Discuss the definition of CHF, how to live with CHF, the signs and symptoms of CHF, and how keep track of weight and sodium intake. Flowsheet Row CARDIAC REHAB PHASE II EXERCISE from 01/14/2023 in [place] CARDIAC REHABILITATION  Date 12/10/22  Educator HB  Instruction Review Code 1- Verbalizes Understanding       Heart Disease and  Intimacy -Discus the effect sexual activity has on the heart, how changes occur during intimacy as we age, and safety during sexual activity. Flowsheet Row CARDIAC REHAB PHASE II EXERCISE from 01/14/2023 in [place] CARDIAC REHABILITATION  Date 12/17/22  Educator HB  Instruction Review Code 1- Verbalizes Understanding       Smoking Cessation / COPD -Discuss different methods to quit smoking, the health benefits of quitting smoking, and the definition of COPD. Flowsheet Row CARDIAC REHAB PHASE II EXERCISE from 01/14/2023 in [place] CARDIAC REHABILITATION  Date 12/24/22  Educator hb  Instruction Review Code 2- Demonstrated Understanding       Nutrition I: Fats -Discuss the types of cholesterol, what cholesterol does to the heart, and how cholesterol levels  can be controlled. Flowsheet Row CARDIAC REHAB PHASE II EXERCISE from 01/14/2023 in [place] CARDIAC REHABILITATION  Date 12/31/22  Educator HB  Instruction Review Code 1- Verbalizes Understanding       Nutrition II: Labels -Discuss the different components of food labels and how to read food label Flowsheet Row CARDIAC REHAB PHASE II EXERCISE from 01/14/2023 in [place] CARDIAC REHABILITATION  Date 01/07/23  Educator DJ  Instruction Review Code 1- Verbalizes Understanding       Heart Parts/Heart Disease and PAD -Discuss the anatomy of the heart, the pathway of blood circulation through the heart, and these are affected by heart disease. Flowsheet Row CARDIAC REHAB PHASE II EXERCISE from 01/14/2023 in [place] CARDIAC REHABILITATION  Date 11/19/22  Educator HB  Instruction Review Code 1- Verbalizes Understanding       Stress I: Signs and Symptoms -Discuss the causes of stress, how stress may lead to anxiety and depression, and ways to limit stress.   Stress II: Relaxation -Discuss different types of relaxation techniques to limit stress.   Warning Signs of Stroke / TIA -Discuss definition of a stroke, what  the signs and symptoms are of a stroke, and how to identify when someone is having stroke. Flowsheet Row CARDIAC REHAB PHASE II EXERCISE from 01/14/2023 in [place] CARDIAC REHABILITATION  Date 11/12/22  Educator DF  Instruction Review Code 2- Demonstrated Understanding       Knowledge Questionnaire Score:  Knowledge Questionnaire Score - 11/04/22 0920       Knowledge Questionnaire Score   Pre Score 20/24             Core Components/Risk Factors/Patient Goals at Admission:  Personal Goals and Risk Factors at Admission - 11/04/22 0923       Core Components/Risk Factors/Patient Goals on Admission    Weight Management Yes;Weight Gain    Intervention Weight Management: Develop a combined nutrition and exercise program designed to reach desired caloric intake, while maintaining appropriate intake of nutrient and fiber, sodium and fats, and appropriate energy expenditure required for the weight goal.;Weight Management: Provide education and appropriate resources to help participant work on and attain dietary goals.    Expected Outcomes Weight Gain: Understanding of general recommendations for a high calorie, high protein meal plan that promotes weight gain by distributing calorie intake throughout the day with the consumption for 4-5 meals, snacks, and/or supplements    Diabetes Yes    Intervention Provide education about signs/symptoms and action to take for hypo/hyperglycemia.;Provide education about proper nutrition, including hydration, and aerobic/resistive exercise prescription along with prescribed medications to achieve blood glucose in normal ranges: Fasting glucose 65-99 mg/dL    Expected Outcomes Short Term: Participant verbalizes understanding of the signs/symptoms and immediate care of hyper/hypoglycemia, proper foot care and importance of medication, aerobic/resistive exercise and nutrition plan for blood glucose control.;Long Term: Attainment of HbA1C < 7%.    Personal Goal  Other Yes    Personal Goal Increase strength and stamina; return to doing his regular yard work    Intervention Attend CR three days per week and begin a home exercise program    Expected Outcomes Pt will meet stated goals.             Core Components/Risk Factors/Patient Goals Review:   Goals and Risk Factor Review     Row Name 11/26/22 1251 12/24/22 0853 01/19/23 1232         Core Components/Risk Factors/Patient Goals Review   Personal Goals Review Weight  Management/Obesity;Diabetes;Other Weight Management/Obesity;Diabetes;Other Weight Management/Obesity;Diabetes;Other     Review Patient was referred to CR with acute on chronic HF and LVAD placement. He has multiple risk factors for CAD and is participating in the program for risk modification. He has completed 7 sessions with his current weight at 178.5 lbs up 4.5 lbs since his orientation visit. He is doing well in the program with consistent attendance and progressions. His DM is managed with Jardiance and Prandin. His last A1C on file was 09/24/22 at 7.0%. His dopplers readings are WNL. His personal goals for the program are to increase his strength and stamina and return to his usual hyard work and activities. We will continue to monitor his progress as he works towards meeting these goals. Patient has completed 19 sessions with his current weight at 174.0 lbs down 4.5 lbs since last 30 day review. He is continue to do well in the program with consistent attendance and progressions. His DM continues to be managed with Jardiance and Prandin. His last A1C on file was 09/24/22 at 7.0%. His dopplers readings are WNL. Patient saw Dr. Shawna Clamp 4/18 for routine check and complained of dizziness. He discontinued his Losartan and Potassium. Patient was seen again for a sick visit 4/22 complaining of dizziness with near syncope. MD discontinued Spirolactone and added Potassium and adjusted his LVAD settings. Paitent called the clinic 4/29 and symptoms  had resolved. MD was notified 5/8 about his elevated HR in CR. He said it was okay to start him if his resting HR was 110-115 and increased his exercise HR to 130-140. His personal goals for the program are to increase his strength and stamina and return to his usual hyard work and activities. We will continue to monitor his progress as he works towards meeting these goals. Patient has completed 30 sessions with his current weight at 180.4 lbs up 6.5 lbs since last 30 day review. He is continue to do well in the program with consistent attendance and progressions. His DM continues to be managed with Jardiance and Prandin. His last A1C on file was 09/24/22 at 7.0%. His dopplers readings are WNL. Patient saw Dr. Shawna Clamp 5/16 for routine follow up. Patient was still complaining of lightheadness and MD decreased Lasix to 20 mg qod. No other changes make. His personal goals for the program are to increase his strength and stamina and return to his usual hyard work and activities. We will continue to monitor his progress as he works towards meeting these goals.     Expected Outcomes Patient will complete the program meeting both personal and program goals. Patient will complete the program meeting both personal and program goals. Patient will complete the program meeting both personal and program goals.              Core Components/Risk Factors/Patient Goals at Discharge (Final Review):   Goals and Risk Factor Review - 01/19/23 1232       Core Components/Risk Factors/Patient Goals Review   Personal Goals Review Weight Management/Obesity;Diabetes;Other    Review Patient has completed 30 sessions with his current weight at 180.4 lbs up 6.5 lbs since last 30 day review. He is continue to do well in the program with consistent attendance and progressions. His DM continues to be managed with Jardiance and Prandin. His last A1C on file was 09/24/22 at 7.0%. His dopplers readings are WNL. Patient saw Dr. Shawna Clamp  5/16 for routine follow up. Patient was still complaining of lightheadness and MD decreased Lasix to 20  mg qod. No other changes make. His personal goals for the program are to increase his strength and stamina and return to his usual hyard work and activities. We will continue to monitor his progress as he works towards meeting these goals.    Expected Outcomes Patient will complete the program meeting both personal and program goals.             ITP Comments:  ITP Comments     Row Name 12/17/22 1236           ITP Comments Contacted Dr. Gasper Lloyd regarding patients elevated HR via sucure chat. Dr. Gasper Lloyd said he was fine starting him with a resing HR of 110 to 115 and his exercise target should be 130 to 140.                Comments: ITP REVIEW Pt is making expected progress toward Cardiac Rehab goals after completing 31 sessions. Recommend continued exercise, life style modification, education, and increased stamina and strength.

## 2023-01-28 NOTE — Progress Notes (Signed)
Daily Session Note  Patient Details  Name: Ricardo Zuniga MRN: [id number] Date of Birth: [birth year] Referring Provider:   Flowsheet Row CARDIAC REHAB PHASE II ORIENTATION from 11/04/2022 in [facility] C[facility]  Referring Provider Dr. Delia Chimes       Encounter Date: 01/28/2023  Check In:  Session Check In - 01/28/23 0815       Check-In   Supervising physician immediately available to respond to emergencies [facility] MD immediately available    Physician(s) Dr. Diona Browner    Location [facility]    Staff Present Ross Ludwig, BS, Exercise Physiologist;Sigfredo Schreier BSN, RN    Virtual Visit No    Medication changes reported     No    Fall or balance concerns reported    No    Tobacco Cessation No Change    Warm-up and Cool-down Performed as group-led instruction    Resistance Training Performed Yes    VAD Patient? No    PAD/SET Patient? No      VAD patient   Has back up controller? Yes    Has spare charged batteries? Yes    Has battery cables? Yes    Has compatible battery clips? Yes      Pain Assessment   Currently in Pain? Yes    Pain Score 4     Pain Location Back    Pain Orientation Lower    Pain Descriptors / Indicators Sore;Aching    Pain Type Acute pain    Pain Onset In the past 7 days    Pain Frequency Constant    Multiple Pain Sites No             Capillary Blood Glucose: No results found for this or any previous visit (from the past 24 hour(s)).    Social History   Tobacco Use  Smoking Status Former   Packs/day: 2.00   Years: 17.00   Additional pack years: 0.00   Total pack years: 34.00   Types: Cigarettes   Start date: 02/17/1968   Quit date: 02/28/1986   Years since quitting: 36.9   Passive exposure: Never  Smokeless Tobacco Never    Goals Met:  Independence with exercise equipment Exercise tolerated well No report of concerns or symptoms today Strength training completed today  Goals Unmet:  Not  Applicable  Comments: check out at 9:15   Dr. Dina Rich is Medical Director for [facility] Cardiac Re[facility]

## 2023-01-30 ENCOUNTER — Encounter (HOSPITAL_COMMUNITY)
Admission: RE | Admit: 2023-01-30 | Discharge: 2023-01-30 | Disposition: A | Discharge status: Home / Self Care | Payer: Medicare PPO | Source: Ambulatory Visit | Attending: Cardiology | Admitting: Cardiology

## 2023-01-30 ENCOUNTER — Telehealth: Payer: Self-pay

## 2023-01-30 DIAGNOSIS — Z95811 Presence of heart assist device: Secondary | ICD-10-CM

## 2023-01-30 DIAGNOSIS — I5023 Acute on chronic systolic (congestive) heart failure: Secondary | ICD-10-CM

## 2023-01-30 NOTE — Progress Notes (Signed)
Daily Session Note  Patient Details  Name: Ricardo Zuniga MRN: [id number] Date of Birth: 1950/12/31 Referring Provider:   Flowsheet Row CARDIAC REHAB PHASE II ORIENTATION from 11/04/2022 in [facility] CARDIAC REHABILITATION  Referring Provider Dr. Delia Chimes       Encounter Date: 01/30/2023  Check In:  Session Check In - 01/30/23 0815       Check-In   Supervising physician immediately available to respond to emergencies [facility] MD immediately available    Physician(s) Dr. Diona Browner    Location [facility]    Staff Present Ross Ludwig, BS, Exercise Physiologist;Shae Augello, RN;Daphyne Daphine Deutscher, RN, BSN    Virtual Visit No    Medication changes reported     No    Fall or balance concerns reported    No    Tobacco Cessation No Change    Warm-up and Cool-down Performed as group-led instruction    Resistance Training Performed Yes    VAD Patient? No    PAD/SET Patient? No      VAD patient   Has back up controller? Yes    Has spare charged batteries? Yes    Has battery cables? Yes    Has compatible battery clips? Yes      Pain Assessment   Currently in Pain? No/denies    Multiple Pain Sites No             Capillary Blood Glucose: No results found for this or any previous visit (from the past 24 hour(s)).    Social History   Tobacco Use  Smoking Status Former   Packs/day: 2.00   Years: 17.00   Additional pack years: 0.00   Total pack years: 34.00   Types: Cigarettes   Start date: 02/17/1968   Quit date: 02/28/1986   Years since quitting: 36.9   Passive exposure: Never  Smokeless Tobacco Never    Goals Met:  Independence with exercise equipment Exercise tolerated well No report of concerns or symptoms today Strength training completed today  Goals Unmet:  Not Applicable  Comments: check out @ 9:15am   Dr. Dina Rich is Medical Director for [facility] Card[facility] Re[facility]

## 2023-01-30 NOTE — Telephone Encounter (Signed)
Transition Care Management Follow-up Telephone Call Date of discharge and from where: [facility] 6/16 How have you been since you were released from the hospital? Doing well Any questions or concerns? No  Items Reviewed: Did the pt receive and understand the discharge instructions provided? Yes  Medications obtained and verified? Yes  Other? No  Any new allergies since your discharge? No  Dietary orders reviewed? No Do you have support at home? Yes    Follow up appointments reviewed:  PCP Hospital f/u appt confirmed? No  Scheduled to see  on  @ . Specialist Hospital f/u appt confirmed? No  Scheduled to see  on  @ . Are transportation arrangements needed? No  If their condition worsens, is the pt aware to call PCP or go to the Emergency Dept.? Yes Was the patient provided with contact information for the PCP's office or ED? Yes Was to pt encouraged to call back with questions or concerns? Yes

## 2023-02-02 ENCOUNTER — Encounter (HOSPITAL_COMMUNITY)
Admission: RE | Admit: 2023-02-02 | Discharge: 2023-02-02 | Disposition: A | Discharge status: Home / Self Care | Payer: Medicare PPO | Source: Ambulatory Visit | Attending: Cardiology | Admitting: Cardiology

## 2023-02-02 DIAGNOSIS — Z95811 Presence of heart assist device: Secondary | ICD-10-CM

## 2023-02-02 DIAGNOSIS — I5023 Acute on chronic systolic (congestive) heart failure: Secondary | ICD-10-CM

## 2023-02-02 NOTE — Progress Notes (Signed)
Daily Session Note  Patient Details  Name: KHANI PAINO MRN: [id number] Date of Birth: [birth year] Referring Provider:   Flowsheet Row CARDIAC REHAB PHASE II ORIENTATION from 11/04/2022 in [facility] CARDIAC REHABILITATION  Referring Provider Dr. Delia Chimes       Encounter Date: 02/02/2023  Check In:  Session Check In - 02/02/23 0815       Check-In   Supervising physician immediately available to respond to emergencies [facility] MD immediately available    Physician(s) Dr. Jenene Slicker    Location [facility]    Staff Present Ross Ludwig, BS, Exercise Physiologist;Grisel Blumenstock, RN;Daphyne Daphine Deutscher, RN, BSN    Virtual Visit No    Medication changes reported     No    Fall or balance concerns reported    No    Tobacco Cessation No Change    Warm-up and Cool-down Performed as group-led instruction    Resistance Training Performed Yes    VAD Patient? Yes      VAD patient   Has back up controller? Yes    Has spare charged batteries? Yes    Has battery cables? Yes    Has compatible battery clips? Yes      Pain Assessment   Currently in Pain? No/denies    Multiple Pain Sites No             Capillary Blood Glucose: No results found for this or any previous visit (from the past 24 hour(s)).    Social History   Tobacco Use  Smoking Status Former   Packs/day: 2.00   Years: 17.00   Additional pack years: 0.00   Total pack years: 34.00   Types: Cigarettes   Start date: 02/17/1968   Quit date: 02/28/1986   Years since quitting: 36.9   Passive exposure: Never  Smokeless Tobacco Never    Goals Met:  Independence with exercise equipment Exercise tolerated well No report of concerns or symptoms today Strength training completed today  Goals Unmet:  Not Applicable  Comments: check out at 9:15am   Dr. Dina Rich is Medical Director for [facility] Cardiac Rehab

## 2023-02-04 ENCOUNTER — Other Ambulatory Visit (HOSPITAL_COMMUNITY): Payer: Self-pay | Admitting: *Deleted

## 2023-02-04 ENCOUNTER — Encounter (HOSPITAL_COMMUNITY)
Admission: RE | Admit: 2023-02-04 | Discharge: 2023-02-04 | Disposition: A | Discharge status: Home / Self Care | Payer: Medicare PPO | Source: Ambulatory Visit | Attending: Cardiology | Admitting: Cardiology

## 2023-02-04 DIAGNOSIS — Z95811 Presence of heart assist device: Secondary | ICD-10-CM

## 2023-02-04 DIAGNOSIS — I5023 Acute on chronic systolic (congestive) heart failure: Secondary | ICD-10-CM

## 2023-02-04 NOTE — Progress Notes (Signed)
Daily Session Note  Patient Details  Name: Ricardo Zuniga MRN: [id number] Date of Birth: 1951-03-14 Referring Provider:   Flowsheet Row CARDIAC REHAB PHASE II ORIENTATION from 11/04/2022 in [facility] CARDIAC REHABILITATION  Referring Provider Dr. Delia Chimes       Encounter Date: 02/04/2023  Check In:  Session Check In - 02/04/23 0815       Check-In   Supervising physician immediately available to respond to emergencies [facility] MD immediately available    Physician(s) Dr. Jenene Slicker    Location [facility]    Staff Present Rodena Medin, RN, BSN;Hillary Troutman BSN, RN;Heather Fredric Mare, BS, Exercise Physiologist    Virtual Visit No    Medication changes reported     No    Fall or balance concerns reported    No    Tobacco Cessation No Change    Warm-up and Cool-down Performed as group-led instruction    Resistance Training Performed Yes    VAD Patient? Yes    PAD/SET Patient? No      VAD patient   Has back up controller? Yes    Has spare charged batteries? Yes    Has battery cables? Yes    Has compatible battery clips? Yes      Pain Assessment   Currently in Pain? No/denies    Pain Score 0-No pain    Multiple Pain Sites No             Capillary Blood Glucose: No results found for this or any previous visit (from the past 24 hour(s)).    Social History   Tobacco Use  Smoking Status Former   Packs/day: 2.00   Years: 17.00   Additional pack years: 0.00   Total pack years: 34.00   Types: Cigarettes   Start date: 02/17/1968   Quit date: 02/28/1986   Years since quitting: 36.9   Passive exposure: Never  Smokeless Tobacco Never    Goals Met:  Independence with exercise equipment Exercise tolerated well No report of concerns or symptoms today Strength training completed today  Goals Unmet:  Not Applicable  Comments: Check out 915.   Dr. Dina Rich is Medical Director for [facility] Cardiac Rehab

## 2023-02-06 ENCOUNTER — Encounter (HOSPITAL_COMMUNITY)
Admission: RE | Admit: 2023-02-06 | Discharge: 2023-02-06 | Disposition: A | Discharge status: Home / Self Care | Payer: Medicare PPO | Source: Ambulatory Visit | Attending: Cardiology | Admitting: Cardiology

## 2023-02-06 ENCOUNTER — Other Ambulatory Visit (HOSPITAL_COMMUNITY): Payer: Self-pay | Admitting: *Deleted

## 2023-02-06 VITALS — Ht 69.5 in | Wt 183.6 lb

## 2023-02-06 DIAGNOSIS — I5022 Chronic systolic (congestive) heart failure: Secondary | ICD-10-CM

## 2023-02-06 DIAGNOSIS — Z95811 Presence of heart assist device: Secondary | ICD-10-CM

## 2023-02-06 DIAGNOSIS — I5023 Acute on chronic systolic (congestive) heart failure: Secondary | ICD-10-CM

## 2023-02-06 DIAGNOSIS — Z7901 Long term (current) use of anticoagulants: Secondary | ICD-10-CM

## 2023-02-06 NOTE — Progress Notes (Signed)
Daily Session Note  Patient Details  Name: Ricardo Zuniga MRN: [id number] Date of Birth: [birth year] Referring Provider:   Flowsheet Row CARDIAC REHAB PHASE II ORIENTATION from 11/04/2022 in [facility] CARDIAC REHABILITATION  Referring Provider Dr. Delia Chimes       Encounter Date: 02/06/2023  Check In:  Session Check In - 02/06/23 0817       Check-In   Supervising physician immediately available to respond to emergencies [facility] MD immediately available    Physician(s) Dr. Jenene Slicker    Location [facility]    Staff Present Erskine Speed, RN;Heather Fredric Mare, BS, Exercise Physiologist;Debra Laural Benes, RN, Pleas Koch, RN, BSN    Virtual Visit No    Medication changes reported     No    Fall or balance concerns reported    No    Tobacco Cessation No Change    Warm-up and Cool-down Performed as group-led instruction    Resistance Training Performed Yes    VAD Patient? Yes    PAD/SET Patient? No      VAD patient   Has spare charged batteries? Yes    Has battery cables? Yes    Has compatible battery clips? Yes      Pain Assessment   Currently in Pain? No/denies    Pain Score 0-No pain    Multiple Pain Sites No             Capillary Blood Glucose: No results found for this or any previous visit (from the past 24 hour(s)).    Social History   Tobacco Use  Smoking Status Former   Packs/day: 2.00   Years: 17.00   Additional pack years: 0.00   Total pack years: 34.00   Types: Cigarettes   Start date: 02/17/1968   Quit date: 02/28/1986   Years since quitting: 36.9   Passive exposure: Never  Smokeless Tobacco Never    Goals Met:  Independence with exercise equipment Exercise tolerated well No report of concerns or symptoms today Strength training completed today  Goals Unmet:  Not Applicable  Comments: check out @ 9:15am   Dr. Dina Rich is Medical Director for [facility] Cardiac Re[facility]

## 2023-02-09 ENCOUNTER — Encounter (HOSPITAL_COMMUNITY): Payer: Medicare PPO | Admitting: Cardiology

## 2023-02-10 ENCOUNTER — Ambulatory Visit (HOSPITAL_COMMUNITY)
Admission: RE | Admit: 2023-02-10 | Discharge: 2023-02-10 | Disposition: A | Discharge status: Home / Self Care | Payer: Medicare PPO | Source: Ambulatory Visit | Attending: Cardiology | Admitting: Cardiology

## 2023-02-10 ENCOUNTER — Other Ambulatory Visit (HOSPITAL_COMMUNITY): Payer: Self-pay

## 2023-02-10 ENCOUNTER — Ambulatory Visit (HOSPITAL_COMMUNITY): Payer: Self-pay | Admitting: Pharmacist

## 2023-02-10 ENCOUNTER — Encounter (HOSPITAL_COMMUNITY): Payer: Self-pay | Admitting: Cardiology

## 2023-02-10 DIAGNOSIS — E1140 Type 2 diabetes mellitus with diabetic neuropathy, unspecified: Secondary | ICD-10-CM | POA: Diagnosis not present

## 2023-02-10 DIAGNOSIS — Z95811 Presence of heart assist device: Secondary | ICD-10-CM

## 2023-02-10 DIAGNOSIS — I2510 Atherosclerotic heart disease of native coronary artery without angina pectoris: Secondary | ICD-10-CM | POA: Insufficient documentation

## 2023-02-10 DIAGNOSIS — Z87891 Personal history of nicotine dependence: Secondary | ICD-10-CM | POA: Insufficient documentation

## 2023-02-10 DIAGNOSIS — C9241 Acute promyelocytic leukemia, in remission: Secondary | ICD-10-CM | POA: Insufficient documentation

## 2023-02-10 DIAGNOSIS — Z79899 Other long term (current) drug therapy: Secondary | ICD-10-CM | POA: Insufficient documentation

## 2023-02-10 DIAGNOSIS — Z7901 Long term (current) use of anticoagulants: Secondary | ICD-10-CM | POA: Insufficient documentation

## 2023-02-10 DIAGNOSIS — Z800 Family history of malignant neoplasm of digestive organs: Secondary | ICD-10-CM | POA: Insufficient documentation

## 2023-02-10 DIAGNOSIS — I5022 Chronic systolic (congestive) heart failure: Secondary | ICD-10-CM | POA: Insufficient documentation

## 2023-02-10 DIAGNOSIS — I428 Other cardiomyopathies: Secondary | ICD-10-CM | POA: Diagnosis not present

## 2023-02-10 DIAGNOSIS — Z8679 Personal history of other diseases of the circulatory system: Secondary | ICD-10-CM | POA: Insufficient documentation

## 2023-02-10 LAB — BASIC METABOLIC PANEL
Anion gap: 10 (ref 5–15)
BUN: 16 mg/dL (ref 8–23)
CO2: 23 mmol/L (ref 22–32)
Calcium: 9.3 mg/dL (ref 8.9–10.3)
Chloride: 102 mmol/L (ref 98–111)
Creatinine, Ser: 0.87 mg/dL (ref 0.61–1.24)
GFR, Estimated: >60 mL/min (ref >60)
Glucose, Bld: 167 mg/dL — ABNORMAL HIGH (ref 70–99)
Potassium: 4.1 mmol/L (ref 3.5–5.1)
Sodium: 135 mmol/L (ref 135–145)

## 2023-02-10 LAB — CBC
HCT: 45.6 % (ref 39.0–52.0)
Hemoglobin: 15.5 g/dL (ref 13.0–17.0)
MCH: 28.2 pg (ref 26.0–34.0)
MCHC: 34 g/dL (ref 30.0–36.0)
MCV: 82.9 fL (ref 80.0–100.0)
Platelets: 216 10*3/uL (ref 150–400)
RBC: 5.5 MIL/uL (ref 4.22–5.81)
RDW: 16.3 % — ABNORMAL HIGH (ref 11.5–15.5)
WBC: 6.3 10*3/uL (ref 4.0–10.5)
nRBC: 0 % (ref 0.0–0.2)

## 2023-02-10 LAB — PROTIME-INR
INR: 1.9 — ABNORMAL HIGH (ref 0.8–1.2)
Prothrombin Time: 22.4 seconds — ABNORMAL HIGH (ref 11.4–15.2)

## 2023-02-10 LAB — LACTATE DEHYDROGENASE: LDH: 191 U/L (ref 98–192)

## 2023-02-10 MED ORDER — FUROSEMIDE 20 MG PO TABS
ORAL_TABLET | ORAL | 3 refills | Status: DC
Start: 2023-02-10 — End: 2023-05-25

## 2023-02-10 MED ORDER — AMOXICILLIN 500 MG PO CAPS: ORAL_CAPSULE | ORAL | 0 refills | Status: DC

## 2023-02-10 NOTE — Progress Notes (Signed)
ADVANCED HEART FAILURE CLINIC NOTE  Referring Physician: Ignatius Specking, MD  Primary Care: Ignatius Specking, MD Primary Cardiologist: Patric Dykes Heart Failure: Dorthula Nettles  HPI: Ricardo Zuniga is a very pleasant 72 YO WM w/ HFrEF 2/2 nonischemic cardiomyopathy, T2DM and hx of acute promyelocytic leukemia s/p ATRA ([facility] 2014) that was initially see at [facility] in October 2023 after presenting with a 1 week history of orthopnea, PND & LE edema. He had a TTE prior to admission w/ LVEF of 40-45%. Echo during admit was notable for LVEF of 30%-35%. LHC/RHC during admit w/ nonobstructive CAD & moderately reduced CI (2.1 L/min/m2). His cardiac history dates back to 2015 when he was found to have an LVEF of 25% felt to be triggered by endocarditis. He had improvement in LV function for several years, however, unfortunately had a fairly rapid decline in LVEF and functional status over the winter months of 2023. The decision was made to proceed with HMIII implantation. Mr. Borsellino underwent successful implant on 09/24/22 as DT. His post-operative course was fairly unremarkable. He had rapid improvement in PA pressures after HMIII implantation.   Interval hx: - NYHA I-II functional status; no limitations at this time.  - Continues to have some lightheadedness 2/2 vertigo; this has been present for years preceding LVAD placement.  - PI events decreasing; now 5-10 daily.   Past Medical History:  Diagnosis Date   Anal fissure    APML (acute promyelocytic leukemia) in remission (HCC)    Completed treatment 04/2013   CHF (congestive heart failure) (HCC)    Coronary artery disease    Nonobstructive at cardiac catheterization 09/2018   Difficult intubation    w/shoulder surgery at surgical center between 2005-2008   Gastroesophageal reflux disease    History of blood transfusion 2014   History of kidney stones    passed stones   Hypercholesteremia    Neuropathy    bilateral feet    Nonischemic cardiomyopathy (HCC)    a. EF 20% in 2015 - thought to possibly be viral induced or chemotherapy induced from treatments in 2014 b. at 35-40% by echo in 05/2016    Peptic ulcer disease    Pneumonia    Yrs ago   Type 2 diabetes mellitus (HCC)    Ureteral colic    Vertigo    tx with valium prn   Wears glasses    Wears hearing aid in both ears     Current Outpatient Medications  Medication Sig Dispense Refill   acetaminophen (TYLENOL) 650 MG CR tablet Take 650-1,300 mg by mouth every 8 (eight) hours as needed for pain.     atorvastatin (LIPITOR) 40 MG tablet Take 1 tablet by mouth once daily 90 tablet 1   Cholecalciferol (VITAMIN D3) 125 MCG (5000 UT) TABS Take 5,000 Units by mouth in the morning.     Digoxin 62.5 MCG TABS Take 1 tablet by mouth daily. 30 tablet 5   docusate sodium (COLACE) 100 MG capsule Take 200 mg by mouth every evening.     empagliflozin (JARDIANCE) 10 MG TABS tablet Take 10 mg by mouth every evening.     omeprazole (PRILOSEC) 20 MG capsule Take 20 mg by mouth daily before breakfast.     potassium chloride SA (KLOR-CON M) 20 MEQ tablet Take 1 tablet (20 mEq total) by mouth daily. 90 tablet 3   repaglinide (PRANDIN) 2 MG tablet Take 2-4 mg by mouth See admin instructions. Take 2 tablets (4  mg) by mouth in the morning & take 1 tablet (2 mg) by mouth in the evening.     traZODone (DESYREL) 100 MG tablet Take 1 tablet (100 mg total) by mouth at bedtime as needed for sleep. 30 tablet 3   warfarin (COUMADIN) 5 MG tablet Take 2.5 mg (1/2 tab) every Tuesday/Saturday and 5 mg (1 tab) all other days or as directed by HF Clinic 90 tablet 11   enoxaparin (LOVENOX) 40 MG/0.4ML injection Inject 0.4 mLs (40 mg total) into the skin every 12 (twelve) hours for 10 doses. Take for 2 days, then await further instructions by the clinic 4 mL 0   furosemide (LASIX) 20 MG tablet Take 20mg  Monday Wednesday and Friday 90 tablet 3   oxyCODONE-acetaminophen (PERCOCET/ROXICET) 5-325 MG  tablet Take 1 tablet by mouth every 6 (six) hours as needed for severe pain. (Patient not taking: Reported on 02/10/2023) 6 tablet 0   No current facility-administered medications for this encounter.    Allergies  Allergen Reactions   Zestril [Lisinopril] Cough      Social History   Socioeconomic History   Marital status: Married    Spouse name: Not on file   Number of children: Not on file   Years of education: Not on file   Highest education level: Not on file  Occupational History   Occupation: RETIRED    Comment: [name]   Occupation: SECURITY GUARD  Tobacco Use   Smoking status: Former    Packs/day: 2.00    Years: 17.00    Additional pack years: 0.00    Total pack years: 34.00    Types: Cigarettes    Start date: 02/17/1968    Quit date: 02/28/1986    Years since quitting: 36.9    Passive exposure: Never   Smokeless tobacco: Never  Vaping Use   Vaping Use: Never used  Substance and Sexual Activity   Alcohol use: No    Alcohol/week: 0.0 standard drinks of alcohol   Drug use: No   Sexual activity: Not Currently  Other Topics Concern   Not on file  Social History Narrative   Not on file   Social Determinants of Health   Financial Resource Strain: Low Risk  (09/03/2022)   Overall Financial Resource Strain (CARDIA)    Difficulty of Paying Living Expenses: Not hard at all  Food Insecurity: No Food Insecurity (09/23/2022)   Hunger Vital Sign    Worried About Running Out of Food in the Last Year: Never true    Ran Out of Food in the Last Year: Never true  Transportation Needs: No Transportation Needs (09/23/2022)   PRAPARE - Administrator, Civil Service (Medical): No    Lack of Transportation (Non-Medical): No  Physical Activity: Not on file  Stress: Not on file  Social Connections: Not on file  Intimate Partner Violence: Not At Risk (09/23/2022)   Humiliation, Afraid, Rape, and Kick questionnaire    Fear of Current or Ex-Partner: No     Emotionally Abused: No    Physically Abused: No    Sexually Abused: No      Family History  Problem Relation Age of Onset   Colon cancer Other     PHYSICAL EXAM: Vital Signs:  Automatic BP:125/98 (113) Doppler Pressure: 122 HR: 97 SR  SPO2: 98%    Weight: 182.8 lb w/ eqt Last weight: 176.8 lb w/ eqt   VAD Indication: Destination Therapy for age   VAD interrogation & Equipment  Management: Speed: 5600 Flow: 4.0 Power: 4.1 w    PI: 5.6   Alarms: none Events: 5-10 PI events  Fixed speed 5500 Low speed limit: 5200  General: NAD, no complaints today.  Neck: JVP 8-9cm Lungs: CTA B/L; no crackles CV: Normal LVAD hum, radial artery minimally pulsatile.  Abdomen: Soft, nontender, no hepatosplenomegaly, driveline w/o erythema or induration. Skin: Intact without lesions or rashes.  Neurologic: Alert and oriented x 3.  Psych: Normal affect. Extremities: Warm to touch; no edema.  HEENT: Normal.   ECHO: - Echo (06/08/22): EF 30-35%, global HK, RV mildly reduced, mild MR   - Echo (7/23): EF 45%, RV normal   - Echo (8/22): EF 40-45%, RV normal   - Echo (7/21): EF 50%, RV normal  - LHC (09/2018): Moderate nonobstructive one-vessel coronary artery disease, prox RCA 40% stenosed.   - Echo (08/2018): EF 40%, RV normal  - Echo (05/2016): EF 35-40%, RV normal   - Echo (05/2014): EF 25-30%, RV mildly reduced  - Echo (05/2012): EF 50%, RV normal  - Echo (1/24): EF 25%, severe LV dilation, global hypokinesis, moderate RV dysfunction, mild RV enlargement, moderate AI, mild-moderate MR. - Echo (11/27/22): HMIII LVAD in place. Ramp study from 5500RPM-5800RPM; trivial MR throughout. Septum midline. RV enlarged but with preserved function.   CATH: R/LHC (10/23): nonobstructive CAD RA 8 PA 52/25 (34 mean) PCWP 14 (mean) CO/CI (Fick): 4.3/2.1                                 PVR 4.6 WU          PAPi 3.4             RHC (1/24): RA mean 10 PA 69/28, mean 43 PCWP mean 20 CI 1.85 PVR  6.22 WU PAPi 4.1     ASSESSMENT & PLAN:  1. Chronic systolic heart failure s/p HMIII Destination therapy - Significant improvement in functional status since HMIII placement.  -Lab work pending -Continue digoxin 62.5 mcg daily.  -continue jardiance 10mg  daily.  -Lasix 20mg  M-W-F.  -Repeat TTE with RAMP study in 2 months.  - INR 1.9 today; pharmacy following. LDH 191.   2. Hx of atrial tach, PVCs & NSVT -Resolved on Zio patch.  Amiodarone discontinued  3. Hx of promyelocytic leukemia: S/P ATRA in 2014 at [facility]   4. Hx of endocarditis: 2015  I reviewed the LVAD parameters from today and compared the results to the patient's prior recorded data. LVAD interrogation was NEGATIVE for significant power changes, NEGATIVE for clinical alarms and STABLE for PI events/speed drops. No programming changes were made and pump is functioning within specified parameters. Pt is performing daily controller and system monitor self tests along with completing weekly and monthly maintenance for LVAD equipment.   Ladell Lea 02/10/2023 11:04 AM

## 2023-02-10 NOTE — Patient Instructions (Addendum)
2 months f/u with ramp echo Change 20mg  Lasix to Monday, Wednesday and Friday Shower instructions given today

## 2023-02-10 NOTE — Progress Notes (Addendum)
Patient presents for 6 week f/u visit in VAD Clinic today with his wife Erie Noe. Reports no problems with VAD equipment or concerns with drive line.  Patient states he feels significantly better over the last few weeks. Plans to start the maintenance program with cardiac rehab. Reports some intermittent symptoms of lightheadedness/dizziness. Denies falls, shortness of breath, and signs of bleeding. Reports he has had a good appetite and is able to do more around the house.  Requesting to switch Lasix to MWF. Orders changed per Dr. Gasper Lloyd.  Patient plans to have dental cleaning within the next week orders placed for Amoxicillin 2000mg  (4 tablets) 30 minutes to an hour prior to cleaning.  Vital Signs:  Automatic BP:125/98 (113) Doppler Pressure: 122 HR: 97 SR  SPO2: 98%    Weight: 182.8 lb w/ eqt Last weight: 176.8 lb w/ eqt  VAD Indication: Destination Therapy for age   VAD interrogation & Equipment Management: Speed: 5600 Flow: 4.0 Power: 4.1 w    PI: 5.6   Alarms: none Events: 5-10 PI events  Fixed speed 5500 Low speed limit: 5200   Primary Controller:  Replace back up battery in 27 months. Back up controller:  Replace back up battery in 25 months.   Annual Equipment Maintenance on UBC/PM was performed on 10/07/22.    I reviewed the LVAD parameters from today and compared the results to the patient's prior recorded data. LVAD interrogation was NEGATIVE for significant power changes, NEGATIVE for clinical alarms and STABLE for PI events/speed drops. No programming changes were made and pump is functioning within specified parameters. Pt is performing daily controller and system monitor self tests along with completing weekly and monthly maintenance for LVAD equipment.   LVAD equipment check completed and is in good working order. Back-up equipment present. Charged back up battery and performed self-test on equipment.   Exit Site Care: Existing VAD dressing removed and site  care performed using sterile technique. Drive line exit site cleaned with Chlora prep applicators x 2, allowed to dry, and Sorbaview dressing with Silverlon patch applied. Exit site healed and incorporated, the velour is fully implanted at exit site. No redness, tenderness, drainage, foul odor or rash noted. Drive line anchor re-applied. Pt denies fever or chills. Wife Erie Noe placing sterile 2x2 under drive line to prevent pressure injury. Pt given 8 weekly dressing kits for home use and a box large tegaderms.   Provided patient with shower bag for home use. Demonstration along with written instructions and illustrated steps provided. Patient and caregiver verbalized understanding of same.    Here are some tips for washing:  Avoid getting the driveline exit site dressing wet, and consider planning bathing times around exit site dressing changes. Use only the shower bag we gave you to shower with and don't get creative with your equipment to shower. Water and electricity do not mix and will cause your pump to stop.   Sit on a chair or bathing stool in the bathtub or shower stall, and use a basin of warm water and washcloth or sponge to wash. Or, wash at the sink while standing on a towel, so as not to get the floor or bath rug wet. To wash your hair, try using a hand-held shower wand or sprayer while standing over the kitchen sink or bathtub. Be sure that all floor surfaces are dry when walking around after bathing, to avoid slipping. Do not use powder around the exit site dressing. Anytime your dressing appears wet CHANGE IT! Drink  2 large glasses of water prior to getting in shower for first time (always hydrate before shower). Caregiver needs to be accessible during first few showers. Call VAD Coordinator if any changes in appearance of drive line site.  Device: none  BP & Labs: MAP 122 - Doppler is reflecting modified systolic    Hgb 15.5 - No S/S of bleeding. Specifically denies  melena/BRBPR or nosebleeds.   LDH stable at 191 with established baseline of 170 - 208. Denies tea-colored urine. No power elevations noted on interrogation.   Cr .87 - stable    Plan: 2 months f/u with ramp echo  Change 20mg  Lasix to Monday, Wednesday and Friday Shower instructions given today   Simmie Davies RN,BSN VAD Coordinator  Office: [phone]  24/7 Pager: [phone]

## 2023-02-10 NOTE — Progress Notes (Signed)
Discharge Progress Report  Patient Details  Name: Ricardo Zuniga MRN: [id number] Date of Birth: 10-08-1950 Referring Provider:   Flowsheet Row CARDIAC REHAB PHASE II ORIENTATION from 11/04/2022 in [facility] CARDIAC REHABILITATION  Referring Provider Dr. Delia Chimes        Number of Visits: 36  Reason for Discharge:  Patient reached a stable level of exercise. Patient independent in their exercise. Patient has met program and personal goals.  Smoking History:  Social History   Tobacco Use  Smoking Status Former   Packs/day: 2.00   Years: 17.00   Additional pack years: 0.00   Total pack years: 34.00   Types: Cigarettes   Start date: 02/17/1968   Quit date: 02/28/1986   Years since quitting: 36.9   Passive exposure: Never  Smokeless Tobacco Never    Diagnosis:  Acute on chronic systolic (congestive) heart failure (HCC)  LVAD (left ventricular assist device) present (HCC)  ADL [facility]:   Initial Exercise Prescription:  Initial Exercise Prescription - 11/04/22 1000       Date of Initial Exercise RX and Referring Provider   Date 11/04/22    Referring Provider Dr. Delia Chimes    Expected Discharge Date 01/30/23      NuStep   Level 1    SPM 65    Minutes 39      Prescription Details   Frequency (times per week) 3    Duration Progress to 30 minutes of continuous aerobic without signs/symptoms of physical distress      Intensity   THRR 40-80% of Max Heartrate 59-118    Ratings of Perceived Exertion 11-13      Resistance Training   Training Prescription Yes    Weight 2    Reps 10-15             Discharge Exercise Prescription (Final Exercise Prescription Changes):  Exercise Prescription Changes - 01/26/23 1100       Response to Exercise   Blood Pressure (Admit) --   88 MAP   Blood Pressure (Exercise) --   88 MAP   Blood Pressure (Exit) --   88 MAP   Heart Rate (Admit) 110 bpm    Heart Rate (Exercise) 117 bpm    Heart Rate (Exit) 107 bpm    Rating of  Perceived Exertion (Exercise) 12    Duration Continue with 30 min of aerobic exercise without signs/symptoms of physical distress.    Intensity THRR unchanged      Progression   Progression Continue to progress workloads to maintain intensity without signs/symptoms of physical distress.      Resistance Training   Training Prescription Yes    Weight 4    Reps 10-15    Time 10 Minutes      NuStep   Level 2    SPM 115    Minutes 39    METs 3.83             Functional Capacity:  6 Minute Walk     Row Name 11/04/22 1009 02/06/23 1302       6 Minute Walk   Phase Initial Discharge    Distance 1100 feet 1500 feet    Distance % Change -- 36 %    Walk Time 6 minutes 6 minutes    # of Rest Breaks 1 0    MPH 2.08 2.84    METS 2.39 3.24    RPE 12 12    VO2 Peak 8.36 11.35    Symptoms  Yes (comment) No    Comments one sitting break 1 minute due to fatigue --    Resting HR 82 bpm 82 bpm    Resting BP --  78 MAP --  100 MAP    Resting Oxygen Saturation  96 % 98 %    Exercise Oxygen Saturation  during 6 min walk 99 % 98 %    Max Ex. HR 111 bpm 128 bpm    Max Ex. BP --  90 MAP --  104 MAP    2 Minute Post BP --  80 MAP --  100 MAP             Psychological, QOL, Others - Outcomes: PHQ 2/9:    02/10/2023    8:38 AM 11/04/2022    9:04 AM 09/03/2022   10:52 AM  Depression screen PHQ 2/9  Decreased Interest 0 0 0  Down, Depressed, Hopeless 0 0 0  PHQ - 2 Score 0 0 0  Altered sleeping 0 0   Tired, decreased energy 0 1   Change in appetite 0 1   Feeling bad or failure about yourself  0 0   Trouble concentrating 0 0   Moving slowly or fidgety/restless 0 0   Suicidal thoughts 0 0   PHQ-9 Score 0 2   Difficult doing work/chores  Not difficult at all     Quality of Life:  Quality of Life - 02/06/23 1307       Quality of Life   Select Quality of Life      Quality of Life Scores   Health/Function Pre 28.5 %    Health/Function Post 27.64 %    Health/Function %  Change -3.02 %    Socioeconomic Pre 28.21 %    Socioeconomic Post 30 %    Socioeconomic % Change  6.35 %    Psych/Spiritual Pre 29.64 %    Psych/Spiritual Post 30 %    Psych/Spiritual % Change 1.21 %    Family Pre 30 %    Family Post 30 %    Family % Change 0 %    GLOBAL Pre 28.91 %    GLOBAL Post 28.94 %    GLOBAL % Change 0.1 %             Personal Goals: Goals established at orientation with interventions provided to work toward goal.  Personal Goals and Risk Factors at Admission - 11/04/22 0923       Core Components/Risk Factors/Patient Goals on Admission    Weight Management Yes;Weight Gain    Intervention Weight Management: Develop a combined nutrition and exercise program designed to reach desired caloric intake, while maintaining appropriate intake of nutrient and fiber, sodium and fats, and appropriate energy expenditure required for the weight goal.;Weight Management: Provide education and appropriate resources to help participant work on and attain dietary goals.    Expected Outcomes Weight Gain: Understanding of general recommendations for a high calorie, high protein meal plan that promotes weight gain by distributing calorie intake throughout the day with the consumption for 4-5 meals, snacks, and/or supplements    Diabetes Yes    Intervention Provide education about signs/symptoms and action to take for hypo/hyperglycemia.;Provide education about proper nutrition, including hydration, and aerobic/resistive exercise prescription along with prescribed medications to achieve blood glucose in normal ranges: Fasting glucose 65-99 mg/dL    Expected Outcomes Short Term: Participant verbalizes understanding of the signs/symptoms and immediate care of hyper/hypoglycemia, proper foot care and importance of medication,  aerobic/resistive exercise and nutrition plan for blood glucose control.;Long Term: Attainment of HbA1C < 7%.    Personal Goal Other Yes    Personal Goal Increase  strength and stamina; return to doing his regular yard work    Intervention Attend CR three days per week and begin a home exercise program    Expected Outcomes Pt will meet stated goals.              Personal Goals Discharge:  Goals and Risk Factor Review     Row Name 11/26/22 1251 12/24/22 0853 01/19/23 1232 02/10/23 0847       Core Components/Risk Factors/Patient Goals Review   Personal Goals Review Weight Management/Obesity;Diabetes;Other Weight Management/Obesity;Diabetes;Other Weight Management/Obesity;Diabetes;Other Weight Management/Obesity;Diabetes;Other    Review Patient was referred to CR with acute on chronic HF and LVAD placement. He has multiple risk factors for CAD and is participating in the program for risk modification. He has completed 7 sessions with his current weight at 178.5 lbs up 4.5 lbs since his orientation visit. He is doing well in the program with consistent attendance and progressions. His DM is managed with Jardiance and Prandin. His last A1C on file was 09/24/22 at 7.0%. His dopplers readings are WNL. His personal goals for the program are to increase his strength and stamina and return to his usual hyard work and activities. We will continue to monitor his progress as he works towards meeting these goals. Patient has completed 19 sessions with his current weight at 174.0 lbs down 4.5 lbs since last 30 day review. He is continue to do well in the program with consistent attendance and progressions. His DM continues to be managed with Jardiance and Prandin. His last A1C on file was 09/24/22 at 7.0%. His dopplers readings are WNL. Patient saw Dr. Shawna Clamp 4/18 for routine check and complained of dizziness. He discontinued his Losartan and Potassium. Patient was seen again for a sick visit 4/22 complaining of dizziness with near syncope. MD discontinued Spirolactone and added Potassium and adjusted his LVAD settings. Paitent called the clinic 4/29 and symptoms had  resolved. MD was notified 5/8 about his elevated HR in CR. He said it was okay to start him if his resting HR was 110-115 and increased his exercise HR to 130-140. His personal goals for the program are to increase his strength and stamina and return to his usual hyard work and activities. We will continue to monitor his progress as he works towards meeting these goals. Patient has completed 30 sessions with his current weight at 180.4 lbs up 6.5 lbs since last 30 day review. He is continue to do well in the program with consistent attendance and progressions. His DM continues to be managed with Jardiance and Prandin. His last A1C on file was 09/24/22 at 7.0%. His dopplers readings are WNL. Patient saw Dr. Shawna Clamp 5/16 for routine follow up. Patient was still complaining of lightheadness and MD decreased Lasix to 20 mg qod. No other changes make. His personal goals for the program are to increase his strength and stamina and return to his usual hyard work and activities. We will continue to monitor his progress as he works towards meeting these goals. Patient graduated with 36 sessions. He did very well in the program. His exit walk test improved by 36%. He gained 9 lbs in the program. He says the program helped him in so many ways both physically and mentally. He has gained the confidence he needs to live a normal life. He  says he also gained a lot of strenght and stamina. His MAP was always at goal. He plans to join our forever fit maintenance program for continued exercise. Program outcomes goals were met as well as patient's personal goals.    Expected Outcomes Patient will complete the program meeting both personal and program goals. Patient will complete the program meeting both personal and program goals. Patient will complete the program meeting both personal and program goals. Patient will continue to exercise in our maintenance program.             Exercise Goals and Review:  Exercise Goals      Row Name 11/04/22 1012 12/01/22 1320 12/29/22 1146 01/26/23 1153       Exercise Goals   Increase Physical Activity Yes Yes Yes Yes    Intervention Develop an individualized exercise prescription for aerobic and resistive training based on initial evaluation findings, risk stratification, comorbidities and participant's personal goals.;Provide advice, education, support and counseling about physical activity/exercise needs. Develop an individualized exercise prescription for aerobic and resistive training based on initial evaluation findings, risk stratification, comorbidities and participant's personal goals.;Provide advice, education, support and counseling about physical activity/exercise needs. Develop an individualized exercise prescription for aerobic and resistive training based on initial evaluation findings, risk stratification, comorbidities and participant's personal goals.;Provide advice, education, support and counseling about physical activity/exercise needs. Develop an individualized exercise prescription for aerobic and resistive training based on initial evaluation findings, risk stratification, comorbidities and participant's personal goals.;Provide advice, education, support and counseling about physical activity/exercise needs.    Expected Outcomes Short Term: Attend rehab on a regular basis to increase amount of physical activity.;Long Term: Add in home exercise to make exercise part of routine and to increase amount of physical activity.;Long Term: Exercising regularly at least 3-5 days a week. Short Term: Attend rehab on a regular basis to increase amount of physical activity.;Long Term: Add in home exercise to make exercise part of routine and to increase amount of physical activity.;Long Term: Exercising regularly at least 3-5 days a week. Short Term: Attend rehab on a regular basis to increase amount of physical activity.;Long Term: Add in home exercise to make exercise part of routine  and to increase amount of physical activity.;Long Term: Exercising regularly at least 3-5 days a week. Short Term: Attend rehab on a regular basis to increase amount of physical activity.;Long Term: Add in home exercise to make exercise part of routine and to increase amount of physical activity.;Long Term: Exercising regularly at least 3-5 days a week.    Increase Strength and Stamina Yes Yes Yes Yes    Intervention Provide advice, education, support and counseling about physical activity/exercise needs.;Develop an individualized exercise prescription for aerobic and resistive training based on initial evaluation findings, risk stratification, comorbidities and participant's personal goals. Provide advice, education, support and counseling about physical activity/exercise needs.;Develop an individualized exercise prescription for aerobic and resistive training based on initial evaluation findings, risk stratification, comorbidities and participant's personal goals. Provide advice, education, support and counseling about physical activity/exercise needs.;Develop an individualized exercise prescription for aerobic and resistive training based on initial evaluation findings, risk stratification, comorbidities and participant's personal goals. Provide advice, education, support and counseling about physical activity/exercise needs.;Develop an individualized exercise prescription for aerobic and resistive training based on initial evaluation findings, risk stratification, comorbidities and participant's personal goals.    Expected Outcomes Short Term: Increase workloads from initial exercise prescription for resistance, speed, and METs.;Short Term: Perform resistance training exercises routinely during rehab  and add in resistance training at home;Long Term: Improve cardiorespiratory fitness, muscular endurance and strength as measured by increased METs and functional capacity ( ) Short Term: Increase workloads  from initial exercise prescription for resistance, speed, and METs.;Short Term: Perform resistance training exercises routinely during rehab and add in resistance training at home;Long Term: Improve cardiorespiratory fitness, muscular endurance and strength as measured by increased METs and functional capacity ( ) Short Term: Increase workloads from initial exercise prescription for resistance, speed, and METs.;Short Term: Perform resistance training exercises routinely during rehab and add in resistance training at home;Long Term: Improve cardiorespiratory fitness, muscular endurance and strength as measured by increased METs and functional capacity ( ) Short Term: Increase workloads from initial exercise prescription for resistance, speed, and METs.;Short Term: Perform resistance training exercises routinely during rehab and add in resistance training at home;Long Term: Improve cardiorespiratory fitness, muscular endurance and strength as measured by increased METs and functional capacity ( )    Able to understand and use rate of perceived exertion (RPE) scale Yes Yes Yes Yes    Intervention Provide education and explanation on how to use RPE scale Provide education and explanation on how to use RPE scale Provide education and explanation on how to use RPE scale Provide education and explanation on how to use RPE scale    Expected Outcomes Short Term: Able to use RPE daily in rehab to express subjective intensity level;Long Term:  Able to use RPE to guide intensity level when exercising independently Short Term: Able to use RPE daily in rehab to express subjective intensity level;Long Term:  Able to use RPE to guide intensity level when exercising independently Short Term: Able to use RPE daily in rehab to express subjective intensity level;Long Term:  Able to use RPE to guide intensity level when exercising independently Short Term: Able to use RPE daily in rehab to express subjective intensity  level;Long Term:  Able to use RPE to guide intensity level when exercising independently    Knowledge and understanding of Target Heart Rate Range (THRR) Yes Yes Yes Yes    Intervention Provide education and explanation of THRR including how the numbers were predicted and where they are located for reference Provide education and explanation of THRR including how the numbers were predicted and where they are located for reference Provide education and explanation of THRR including how the numbers were predicted and where they are located for reference Provide education and explanation of THRR including how the numbers were predicted and where they are located for reference    Expected Outcomes Short Term: Able to state/look up THRR;Short Term: Able to use daily as guideline for intensity in rehab;Long Term: Able to use THRR to govern intensity when exercising independently Short Term: Able to state/look up THRR;Short Term: Able to use daily as guideline for intensity in rehab;Long Term: Able to use THRR to govern intensity when exercising independently Short Term: Able to state/look up THRR;Short Term: Able to use daily as guideline for intensity in rehab;Long Term: Able to use THRR to govern intensity when exercising independently Short Term: Able to state/look up THRR;Short Term: Able to use daily as guideline for intensity in rehab;Long Term: Able to use THRR to govern intensity when exercising independently    Able to check pulse independently Yes Yes Yes Yes    Intervention Provide education and demonstration on how to check pulse in carotid and radial arteries.;Review the importance of being able to check your own pulse for safety during independent exercise Provide education  and demonstration on how to check pulse in carotid and radial arteries.;Review the importance of being able to check your own pulse for safety during independent exercise Provide education and demonstration on how to check pulse in  carotid and radial arteries.;Review the importance of being able to check your own pulse for safety during independent exercise Provide education and demonstration on how to check pulse in carotid and radial arteries.;Review the importance of being able to check your own pulse for safety during independent exercise    Expected Outcomes Short Term: Able to explain why pulse checking is important during independent exercise;Long Term: Able to check pulse independently and accurately Short Term: Able to explain why pulse checking is important during independent exercise;Long Term: Able to check pulse independently and accurately Short Term: Able to explain why pulse checking is important during independent exercise;Long Term: Able to check pulse independently and accurately Short Term: Able to explain why pulse checking is important during independent exercise;Long Term: Able to check pulse independently and accurately    Understanding of Exercise Prescription Yes Yes Yes Yes    Intervention Provide education, explanation, and written materials on patient's individual exercise prescription Provide education, explanation, and written materials on patient's individual exercise prescription Provide education, explanation, and written materials on patient's individual exercise prescription Provide education, explanation, and written materials on patient's individual exercise prescription    Expected Outcomes Short Term: Able to explain program exercise prescription;Long Term: Able to explain home exercise prescription to exercise independently Short Term: Able to explain program exercise prescription;Long Term: Able to explain home exercise prescription to exercise independently Short Term: Able to explain program exercise prescription;Long Term: Able to explain home exercise prescription to exercise independently Short Term: Able to explain program exercise prescription;Long Term: Able to explain home exercise  prescription to exercise independently             Exercise Goals Re-Evaluation:  Exercise Goals Re-Evaluation     Row Name 12/01/22 1320 12/29/22 1147 01/26/23 1154         Exercise Goal Re-Evaluation   Exercise Goals Review Increase Physical Activity;Increase Strength and Stamina;Able to understand and use rate of perceived exertion (RPE) scale;Able to check pulse independently;Knowledge and understanding of Target Heart Rate Range (THRR);Understanding of Exercise Prescription Increase Physical Activity;Increase Strength and Stamina;Able to understand and use rate of perceived exertion (RPE) scale;Knowledge and understanding of Target Heart Rate Range (THRR);Able to check pulse independently;Understanding of Exercise Prescription Increase Physical Activity;Increase Strength and Stamina;Able to understand and use rate of perceived exertion (RPE) scale;Knowledge and understanding of Target Heart Rate Range (THRR);Able to check pulse independently;Understanding of Exercise Prescription     Comments Pt has completed 11 sessions of cardiac rehab. He is very egar to be in the program and is progressing/ tolerating exerise well. He is currently exercising at 3.75 METs on the stepper. Will continue to monitor and progress as able. Pt has completed 23 sessions of cardiac rehab. He remains egar to be in the program and is progressing well. He has had two dizzy spells during exercise and has stopped exercising. HE called LVAD clinic after and modifations were done. He is currently exercising at 3.63 METs on the stepper. Will continue to monitor and progress as able. Pt has completed 31 session of caridac rehab. He continues to progress in the class and increasing his SPM each time. He is about to finish up the class in one week and has stated that he has seen an improvement and that  the calss has helped him. He is currently exercising at 3.83 METs on the stepper. Will continue to monitor and progress as able  .     Expected Outcomes Through exercise at rehab and home, the patient will meet their stated goals Through exercise at rehab and home, the patient will meet their stated goals Through exercise at rehab and home, the patient will meet their stated goals              Nutrition & Weight - Outcomes:  Pre Biometrics - 11/04/22 1013       Pre Biometrics   Height 5' 9.5" (1.765 m)    Weight 78.9 kg    Waist Circumference 36 inches    Hip Circumference 40 inches    Waist to Hip Ratio 0.9 %    BMI (Calculated) 25.33    Triceps Skinfold 10 mm    % Body Fat 23.2 %    Grip Strength 13.3 kg    Flexibility 0 in    Single Leg Stand 0 seconds             Post Biometrics - 02/06/23 1306        Post  Biometrics   Height 5' 9.5" (1.765 m)    Weight 83.3 kg    Waist Circumference 38 inches    Hip Circumference 39 inches    Waist to Hip Ratio 0.97 %    BMI (Calculated) 26.74    Triceps Skinfold 10 mm    % Body Fat 24.6 %    Grip Strength 18.6 kg    Flexibility 0 in    Single Leg Stand 0 seconds             Nutrition:  Nutrition Therapy & Goals - 02/10/23 0844       Nutrition Therapy   RD appointment deferred Yes      Personal Nutrition Goals   Comments Patient's diet assessment at discharge was 29 down from 118 initially. He says he has learned alot about eating healthier.             Nutrition Discharge:  Nutrition Assessments - 02/10/23 0843       MEDFICTS Scores   Post Score 28             Education Questionnaire Score:  Knowledge Questionnaire Score - 11/04/22 0920       Knowledge Questionnaire Score   Pre Score 20/24           Patient graduated with 36 sessions. He did very well in the program. His exit walk test improved by 36%. He gained 9 lbs in the program. He says the program helped him in so many ways both physically and mentally. He has gained the confidence he needs to live a normal life. He says he also gained a lot of strenght  and stamina. His MAP was always at goal. He plans to join our forever fit maintenance program for continued exercise. Program outcomes goals were met as well as patient's personal goals.   Goals reviewed with patient; copy given to patient.

## 2023-02-18 ENCOUNTER — Other Ambulatory Visit (HOSPITAL_COMMUNITY): Payer: Self-pay

## 2023-02-18 ENCOUNTER — Other Ambulatory Visit (HOSPITAL_COMMUNITY): Payer: Self-pay | Admitting: Cardiology

## 2023-02-25 ENCOUNTER — Other Ambulatory Visit (HOSPITAL_COMMUNITY): Payer: Self-pay | Admitting: *Deleted

## 2023-02-25 DIAGNOSIS — Z7901 Long term (current) use of anticoagulants: Secondary | ICD-10-CM

## 2023-02-25 DIAGNOSIS — Z95811 Presence of heart assist device: Secondary | ICD-10-CM

## 2023-03-02 ENCOUNTER — Other Ambulatory Visit (HOSPITAL_COMMUNITY)
Admission: RE | Admit: 2023-03-02 | Discharge: 2023-03-02 | Disposition: A | Discharge status: Home / Self Care | Payer: Medicare PPO | Source: Ambulatory Visit | Attending: Cardiology | Admitting: Cardiology

## 2023-03-02 DIAGNOSIS — Z95811 Presence of heart assist device: Secondary | ICD-10-CM | POA: Insufficient documentation

## 2023-03-02 DIAGNOSIS — Z7901 Long term (current) use of anticoagulants: Secondary | ICD-10-CM | POA: Diagnosis not present

## 2023-03-02 LAB — PROTIME-INR
INR: 1.6 — ABNORMAL HIGH (ref 0.8–1.2)
Prothrombin Time: 19.2 seconds — ABNORMAL HIGH (ref 11.4–15.2)

## 2023-03-03 ENCOUNTER — Ambulatory Visit (HOSPITAL_COMMUNITY): Payer: Self-pay | Admitting: Pharmacist

## 2023-03-03 NOTE — Progress Notes (Signed)
Any missed doses?

## 2023-03-13 ENCOUNTER — Other Ambulatory Visit (HOSPITAL_COMMUNITY): Payer: Self-pay | Admitting: Unknown Physician Specialty

## 2023-03-13 DIAGNOSIS — Z95811 Presence of heart assist device: Secondary | ICD-10-CM

## 2023-03-13 DIAGNOSIS — Z7901 Long term (current) use of anticoagulants: Secondary | ICD-10-CM

## 2023-03-16 ENCOUNTER — Ambulatory Visit (HOSPITAL_COMMUNITY): Payer: Self-pay | Admitting: Pharmacist

## 2023-03-16 ENCOUNTER — Other Ambulatory Visit (HOSPITAL_COMMUNITY)
Admission: RE | Admit: 2023-03-16 | Discharge: 2023-03-16 | Disposition: A | Discharge status: Home / Self Care | Payer: Medicare PPO | Source: Ambulatory Visit | Attending: Cardiology | Admitting: Cardiology

## 2023-03-16 DIAGNOSIS — Z7901 Long term (current) use of anticoagulants: Secondary | ICD-10-CM | POA: Insufficient documentation

## 2023-03-16 DIAGNOSIS — Z95811 Presence of heart assist device: Secondary | ICD-10-CM | POA: Insufficient documentation

## 2023-03-16 LAB — PROTIME-INR
INR: 2.2 — ABNORMAL HIGH (ref 0.8–1.2)
Prothrombin Time: 24.5 seconds — ABNORMAL HIGH (ref 11.4–15.2)

## 2023-03-25 ENCOUNTER — Other Ambulatory Visit (HOSPITAL_COMMUNITY): Payer: Self-pay | Admitting: Cardiology

## 2023-03-26 MED ORDER — POTASSIUM CHLORIDE CRYS ER 20 MEQ PO TBCR: 20.0000 meq | EXTENDED_RELEASE_TABLET | Freq: Every day | ORAL | 3 refills | Status: DC

## 2023-03-27 ENCOUNTER — Other Ambulatory Visit (HOSPITAL_COMMUNITY): Payer: Self-pay | Admitting: Cardiology

## 2023-03-27 DIAGNOSIS — Z95811 Presence of heart assist device: Secondary | ICD-10-CM

## 2023-03-27 DIAGNOSIS — Z7901 Long term (current) use of anticoagulants: Secondary | ICD-10-CM

## 2023-04-03 ENCOUNTER — Other Ambulatory Visit (HOSPITAL_COMMUNITY): Payer: Self-pay

## 2023-04-03 DIAGNOSIS — Z7901 Long term (current) use of anticoagulants: Secondary | ICD-10-CM

## 2023-04-03 DIAGNOSIS — Z95811 Presence of heart assist device: Secondary | ICD-10-CM

## 2023-04-05 NOTE — Progress Notes (Signed)
ADVANCED HEART FAILURE CLINIC NOTE  Referring Physician: Ignatius Specking, MD  Primary Care: Ignatius Specking, MD Primary Cardiologist: Patric Dykes Heart Failure: Dorthula Nettles  HPI: Ricardo Zuniga is a very pleasant 71 YO WM w/ HFrEF 2/2 nonischemic cardiomyopathy, T2DM and hx of acute promyelocytic leukemia s/p ATRA ([facility] 2014) that was initially see at [facility] in October 2023 after presenting with a 1 week history of orthopnea, PND & LE edema. He had a TTE prior to admission w/ LVEF of 40-45%. Echo during admit was notable for LVEF of 30%-35%. LHC/RHC during admit w/ nonobstructive CAD & moderately reduced CI (2.1 L/min/m2). His cardiac history dates back to 2015 when he was found to have an LVEF of 25% felt to be triggered by endocarditis. He had improvement in LV function for several years, however, unfortunately had a fairly rapid decline in LVEF and functional status over the winter months of 2023. The decision was made to proceed with HMIII implantation. [name] underwent successful implant on 09/24/22 as DT. His post-operative course was fairly unremarkable. He had rapid improvement in PA pressures after HMIII implantation.   Interval hx: - NYHA I-II functional status; no limitations at this time.  - Continues to have some lightheadedness 2/2 vertigo; this has been present for years preceding LVAD placement.  - PI events decreasing; now 5-10 daily.   Past Medical History:  Diagnosis Date   Anal fissure    APML (acute promyelocytic leukemia) in remission (HCC)    Completed treatment 04/2013   CHF (congestive heart failure) (HCC)    Coronary artery disease    Nonobstructive at cardiac catheterization 09/2018   Difficult intubation    w/shoulder surgery at surgical center between 2005-2008   Gastroesophageal reflux disease    History of blood transfusion 2014   History of kidney stones    passed stones   Hypercholesteremia    Neuropathy    bilateral feet    Nonischemic cardiomyopathy (HCC)    a. EF 20% in 2015 - thought to possibly be viral induced or chemotherapy induced from treatments in 2014 b. at 35-40% by echo in 05/2016    Peptic ulcer disease    Pneumonia    Yrs ago   Type 2 diabetes mellitus (HCC)    Ureteral colic    Vertigo    tx with valium prn   Wears glasses    Wears hearing aid in both ears     Current Outpatient Medications  Medication Sig Dispense Refill   acetaminophen (TYLENOL) 650 MG CR tablet Take 650-1,300 mg by mouth every 8 (eight) hours as needed for pain.     amoxicillin (AMOXIL) 500 MG capsule Take 4 capsules (2000 mg) 30 minutes to an hour prior to dental cleaning 4 capsule 0   atorvastatin (LIPITOR) 40 MG tablet Take 1 tablet by mouth once daily 90 tablet 1   Cholecalciferol (VITAMIN D3) 125 MCG (5000 UT) TABS Take 5,000 Units by mouth in the morning.     Digoxin 62.5 MCG TABS Take 1 tablet by mouth once daily 30 tablet 6   docusate sodium (COLACE) 100 MG capsule Take 200 mg by mouth every evening.     empagliflozin (JARDIANCE) 10 MG TABS tablet Take 10 mg by mouth every evening.     enoxaparin (LOVENOX) 40 MG/0.4ML injection Inject 0.4 mLs (40 mg total) into the skin every 12 (twelve) hours for 10 doses. Take for 2 days, then await further instructions by the  clinic 4 mL 0   furosemide (LASIX) 20 MG tablet Take 20mg  Monday Wednesday and Friday 90 tablet 3   omeprazole (PRILOSEC) 20 MG capsule Take 20 mg by mouth daily before breakfast.     oxyCODONE-acetaminophen (PERCOCET/ROXICET) 5-325 MG tablet Take 1 tablet by mouth every 6 (six) hours as needed for severe pain. (Patient not taking: Reported on 02/10/2023) 6 tablet 0   potassium chloride SA (KLOR-CON M) 20 MEQ tablet Take 1 tablet (20 mEq total) by mouth daily. 60 tablet 3   repaglinide (PRANDIN) 2 MG tablet Take 2-4 mg by mouth See admin instructions. Take 2 tablets (4 mg) by mouth in the morning & take 1 tablet (2 mg) by mouth in the evening.     traZODone  (DESYREL) 100 MG tablet Take 1 tablet (100 mg total) by mouth at bedtime as needed for sleep. 30 tablet 3   warfarin (COUMADIN) 5 MG tablet Take 2.5 mg (1/2 tab) every Tuesday/Saturday and 5 mg (1 tab) all other days or as directed by HF Clinic 90 tablet 11   No current facility-administered medications for this visit.    Allergies  Allergen Reactions   Zestril [Lisinopril] Cough      Social History   Socioeconomic History   Marital status: Married    Spouse name: Not on file   Number of children: Not on file   Years of education: Not on file   Highest education level: Not on file  Occupational History   Occupation: RETIRED    Comment: [name]   Occupation: SECURITY GUARD  Tobacco Use   Smoking status: Former    Current packs/day: 0.00    Average packs/day: 2.0 packs/day for 18.0 years (36.1 ttl pk-yrs)    Types: Cigarettes    Start date: 02/17/1968    Quit date: 02/28/1986    Years since quitting: 37.1    Passive exposure: Never   Smokeless tobacco: Never  Vaping Use   Vaping status: Never Used  Substance and Sexual Activity   Alcohol use: No    Alcohol/week: 0.0 standard drinks of alcohol   Drug use: No   Sexual activity: Not Currently  Other Topics Concern   Not on file  Social History Narrative   Not on file   Social Determinants of Health   Financial Resource Strain: Low Risk  (09/03/2022)   Overall Financial Resource Strain (CARDIA)    Difficulty of Paying Living Expenses: Not hard at all  Food Insecurity: No Food Insecurity (09/23/2022)   Hunger Vital Sign    Worried About Running Out of Food in the Last Year: Never true    Ran Out of Food in the Last Year: Never true  Transportation Needs: No Transportation Needs (09/23/2022)   PRAPARE - Administrator, Civil Service (Medical): No    Lack of Transportation (Non-Medical): No  Physical Activity: Not on file  Stress: Not on file  Social Connections: Not on file  Intimate Partner  Violence: Not At Risk (09/23/2022)   Humiliation, Afraid, Rape, and Kick questionnaire    Fear of Current or Ex-Partner: No    Emotionally Abused: No    Physically Abused: No    Sexually Abused: No      Family History  Problem Relation Age of Onset   Colon cancer Other     PHYSICAL EXAM: Vital Signs:  Automatic BP:125/98 (113) Doppler Pressure: 122 HR: 97 SR  SPO2: 98%    Weight: 182.8 lb w/ eqt  Last weight: 176.8 lb w/ eqt   VAD Indication: Destination Therapy for age   VAD interrogation & Equipment Management: Speed: 5600 Flow: 4.0 Power: 4.1 w    PI: 5.6   Alarms: none Events: 5-10 PI events  Fixed speed 5500 Low speed limit: 5200  General: NAD, no complaints today.  Neck: JVP 8-9cm Lungs: CTA B/L; no crackles CV: Normal LVAD hum, radial artery minimally pulsatile.  Abdomen: Soft, nontender, no hepatosplenomegaly, driveline w/o erythema or induration. Skin: Intact without lesions or rashes.  Neurologic: Alert and oriented x 3.  Psych: Normal affect. Extremities: Warm to touch; no edema.  HEENT: Normal.   ECHO: - Echo (06/08/22): EF 30-35%, global HK, RV mildly reduced, mild MR   - Echo (7/23): EF 45%, RV normal   - Echo (8/22): EF 40-45%, RV normal   - Echo (7/21): EF 50%, RV normal  - LHC (09/2018): Moderate nonobstructive one-vessel coronary artery disease, prox RCA 40% stenosed.   - Echo (08/2018): EF 40%, RV normal  - Echo (05/2016): EF 35-40%, RV normal   - Echo (05/2014): EF 25-30%, RV mildly reduced  - Echo (05/2012): EF 50%, RV normal  - Echo (1/24): EF 25%, severe LV dilation, global hypokinesis, moderate RV dysfunction, mild RV enlargement, moderate AI, mild-moderate MR. - Echo (11/27/22): HMIII LVAD in place. Ramp study from 5500RPM-5800RPM; trivial MR throughout. Septum midline. RV enlarged but with preserved function.   CATH: R/LHC (10/23): nonobstructive CAD RA 8 PA 52/25 (34 mean) PCWP 14 (mean) CO/CI (Fick): 4.3/2.1                                  PVR 4.6 WU          PAPi 3.4             RHC (1/24): RA mean 10 PA 69/28, mean 43 PCWP mean 20 CI 1.85 PVR 6.22 WU PAPi 4.1     ASSESSMENT & PLAN:  1. Chronic systolic heart failure s/p HMIII Destination therapy - Significant improvement in functional status since HMIII placement.  -Lab work pending -Continue digoxin 62.5 mcg daily.  -continue jardiance 10mg  daily.  -Lasix 20mg  M-W-F.  -Repeat TTE with RAMP study in 2 months.  - INR 1.9 today; pharmacy following. LDH 191.   2. Hx of atrial tach, PVCs & NSVT -Resolved on Zio patch.  Amiodarone discontinued  3. Hx of promyelocytic leukemia: S/P ATRA in 2014 at [facility]   4. Hx of endocarditis: 2015  I reviewed the LVAD parameters from today and compared the results to the patient's prior recorded data. LVAD interrogation was NEGATIVE for significant power changes, NEGATIVE for clinical alarms and STABLE for PI events/speed drops. No programming changes were made and pump is functioning within specified parameters. Pt is performing daily controller and system monitor self tests along with completing weekly and monthly maintenance for LVAD equipment.   Kyngston Pickelsimer 04/05/2023 10:21 PM

## 2023-04-06 ENCOUNTER — Ambulatory Visit (HOSPITAL_COMMUNITY): Payer: Self-pay | Admitting: Pharmacist

## 2023-04-06 ENCOUNTER — Ambulatory Visit (HOSPITAL_COMMUNITY)
Admission: RE | Admit: 2023-04-06 | Discharge: 2023-04-06 | Disposition: A | Discharge status: Home / Self Care | Payer: Medicare PPO | Source: Ambulatory Visit | Attending: Cardiology | Admitting: Cardiology

## 2023-04-06 ENCOUNTER — Encounter (HOSPITAL_COMMUNITY): Payer: Self-pay | Admitting: Cardiology

## 2023-04-06 ENCOUNTER — Ambulatory Visit (HOSPITAL_BASED_OUTPATIENT_CLINIC_OR_DEPARTMENT_OTHER)
Admission: RE | Admit: 2023-04-06 | Discharge: 2023-04-06 | Disposition: A | Discharge status: Home / Self Care | Payer: Medicare PPO | Source: Ambulatory Visit | Attending: Cardiology | Admitting: Cardiology

## 2023-04-06 VITALS — BP 104/0 | HR 103 | Wt 189.0 lb

## 2023-04-06 DIAGNOSIS — C9241 Acute promyelocytic leukemia, in remission: Secondary | ICD-10-CM | POA: Diagnosis not present

## 2023-04-06 DIAGNOSIS — I5022 Chronic systolic (congestive) heart failure: Secondary | ICD-10-CM

## 2023-04-06 DIAGNOSIS — Z95811 Presence of heart assist device: Secondary | ICD-10-CM

## 2023-04-06 DIAGNOSIS — Z87891 Personal history of nicotine dependence: Secondary | ICD-10-CM | POA: Insufficient documentation

## 2023-04-06 DIAGNOSIS — E119 Type 2 diabetes mellitus without complications: Secondary | ICD-10-CM | POA: Insufficient documentation

## 2023-04-06 DIAGNOSIS — I428 Other cardiomyopathies: Secondary | ICD-10-CM | POA: Diagnosis not present

## 2023-04-06 DIAGNOSIS — Z4509 Encounter for adjustment and management of other cardiac device: Secondary | ICD-10-CM | POA: Diagnosis present

## 2023-04-06 DIAGNOSIS — Z7901 Long term (current) use of anticoagulants: Secondary | ICD-10-CM | POA: Diagnosis not present

## 2023-04-06 LAB — ECHOCARDIOGRAM LIMITED: Est EF: <20

## 2023-04-06 LAB — BASIC METABOLIC PANEL
Anion gap: 12 (ref 5–15)
BUN: 17 mg/dL (ref 8–23)
CO2: 24 mmol/L (ref 22–32)
Calcium: 9.7 mg/dL (ref 8.9–10.3)
Chloride: 101 mmol/L (ref 98–111)
Creatinine, Ser: 0.88 mg/dL (ref 0.61–1.24)
GFR, Estimated: >60 mL/min (ref >60)
Glucose, Bld: 184 mg/dL — ABNORMAL HIGH (ref 70–99)
Potassium: 4.7 mmol/L (ref 3.5–5.1)
Sodium: 137 mmol/L (ref 135–145)

## 2023-04-06 LAB — LACTATE DEHYDROGENASE: LDH: 195 U/L — ABNORMAL HIGH (ref 98–192)

## 2023-04-06 LAB — DIGOXIN LEVEL: Digoxin Level: 0.4 ng/mL — ABNORMAL LOW (ref 0.8–2.0)

## 2023-04-06 LAB — CBC
HCT: 45.7 % (ref 39.0–52.0)
Hemoglobin: 15.5 g/dL (ref 13.0–17.0)
MCH: 28.2 pg (ref 26.0–34.0)
MCHC: 33.9 g/dL (ref 30.0–36.0)
MCV: 83.2 fL (ref 80.0–100.0)
Platelets: 229 10*3/uL (ref 150–400)
RBC: 5.49 MIL/uL (ref 4.22–5.81)
RDW: 15.3 % (ref 11.5–15.5)
WBC: 6.6 10*3/uL (ref 4.0–10.5)
nRBC: 0 % (ref 0.0–0.2)

## 2023-04-06 LAB — PROTIME-INR
INR: 2 — ABNORMAL HIGH (ref 0.8–1.2)
Prothrombin Time: 23.3 seconds — ABNORMAL HIGH (ref 11.4–15.2)

## 2023-04-06 MED ORDER — ATORVASTATIN CALCIUM 40 MG PO TABS: 40.0000 mg | ORAL_TABLET | Freq: Every day | ORAL | 1 refills | Status: DC

## 2023-04-06 NOTE — Progress Notes (Signed)
Speed  Flow  PI  Power  LVIDD  AI  Aortic opening MR  TR  Septum  RV  VTI (>18cm)  5500 3.8  4.1 4.3 5.5 trace 5/5  trace   trace Slightly pulling right  Mild    5600  4.0 4.1 4.4 5.5 trace 4/5 trace trace midline mild   5700  4.2 4.3 4.4 5.7 trace 4/5 trace trace midline mild                                              Doppler MAP: 104 Auto cuff BP: 109/75 (85)   Ramp ECHO performed at bedside per Dr. Gasper Lloyd  At completion of ramp study, patients primary controller programmed:  Fixed speed:5500 Low speed limit:5200  Ernesta Amble, BSN VAD Coordinator 24/7 Pager [phone]

## 2023-04-06 NOTE — Progress Notes (Signed)
Patient presents for 6 week f/u visit with RAMP Echo in VAD Clinic today with his wife Erie Noe. Reports no problems with VAD equipment or concerns with drive line.  Patient states he feels great has started the maintenance program with Cardiac Rehab 3 days a week at [facility]. Reports increase in overall stamina with regular exercise. Reports baseline intermittent symptoms of lightheadedness/dizziness when standing too quickly. Denies falls, shortness of breath, and signs of bleeding. Reports he has had a good appetite and is able to do more around the house.  Pt taking all medications as prescribed. Medications reviewed with Dr. Gasper Lloyd. MD advises to monitor blood pressure and if MAP continues to trend up we may need to start Losartan 12.5mg . Pt gets blood pressure checked at cardiac rehab three times a week. Advised to monitor and call VAD team if symptomatic.   Vital Signs:  Automatic BP:113/98 (104) Repeat: 109/75 (85) Doppler Pressure: 104 HR: 103 SR  SPO2: 98%    Weight: 189 lb w/ eqt Last weight: 182.8 lb w/ eqt  VAD Indication: Destination Therapy for age   VAD interrogation & Equipment Management: Speed: 5500 Flow: 3.9 Power: 4.3 w    PI: 5.3   Alarms: none Events: 5-10 PI events  Fixed speed 5500 Low speed limit: 5200   Primary Controller:  Replace back up battery in 26 months. Back up controller:  Replace back up battery in 24 months.   Annual Equipment Maintenance on UBC/PM was performed on 10/07/22.    I reviewed the LVAD parameters from today and compared the results to the patient's prior recorded data. LVAD interrogation was NEGATIVE for significant power changes, NEGATIVE for clinical alarms and STABLE for PI events/speed drops. No programming changes were made and pump is functioning within specified parameters. Pt is performing daily controller and system monitor self tests along with completing weekly and monthly maintenance for LVAD equipment.   LVAD  equipment check completed and is in good working order. Back-up equipment present. Charged back up battery and performed self-test on equipment.   Exit Site Care: Existing VAD dressing removed and site care performed using sterile technique. Drive line exit site cleaned with Chlora prep applicators x 2, allowed to dry, and Sorbaview dressing with Silverlon patch applied. Exit site healed and incorporated, the velour is fully implanted at exit site. No redness, tenderness, drainage, foul odor or rash noted. Drive line anchor re-applied. Pt denies fever or chills. Wife Erie Noe placing sterile 2x2 under drive line to prevent pressure injury. Pt given 12 weekly dressing kits for home use and a box large tegaderms.   Device: none  BP & Labs: MAP 104 - Doppler is reflecting modified systolic    Hgb 15.5 - No S/S of bleeding. Specifically denies melena/BRBPR or nosebleeds.   LDH stable at 195 with established baseline of 170 - 208. Denies tea-colored urine. No power elevations noted on interrogation.   Cr .88 - stable    Plan: No medication changes today Coumadin dosing per Leotis Shames [facility] Return in 2 months  Simmie Davies RN,BSN VAD Coordinator  Office: [phone]  24/7 Pager: [phone]

## 2023-04-06 NOTE — Patient Instructions (Signed)
No medication changes today Coumadin dosing per Leotis Shames [facility] Return in 2 months

## 2023-04-06 NOTE — Progress Notes (Signed)
Echocardiogram 2D Echocardiogram has been performed.  Augustine Radar 04/06/2023, 11:00 AM

## 2023-04-08 DIAGNOSIS — Z833 Family history of diabetes mellitus: Secondary | ICD-10-CM | POA: Diagnosis not present

## 2023-04-08 DIAGNOSIS — M1990 Unspecified osteoarthritis, unspecified site: Secondary | ICD-10-CM | POA: Diagnosis not present

## 2023-04-08 DIAGNOSIS — F419 Anxiety disorder, unspecified: Secondary | ICD-10-CM | POA: Diagnosis not present

## 2023-04-08 DIAGNOSIS — K581 Irritable bowel syndrome with constipation: Secondary | ICD-10-CM | POA: Diagnosis not present

## 2023-04-08 DIAGNOSIS — E785 Hyperlipidemia, unspecified: Secondary | ICD-10-CM | POA: Diagnosis not present

## 2023-04-08 DIAGNOSIS — Z8616 Personal history of COVID-19: Secondary | ICD-10-CM | POA: Diagnosis not present

## 2023-04-08 DIAGNOSIS — K219 Gastro-esophageal reflux disease without esophagitis: Secondary | ICD-10-CM | POA: Diagnosis not present

## 2023-04-08 DIAGNOSIS — I472 Ventricular tachycardia, unspecified: Secondary | ICD-10-CM | POA: Diagnosis not present

## 2023-04-08 DIAGNOSIS — I2510 Atherosclerotic heart disease of native coronary artery without angina pectoris: Secondary | ICD-10-CM | POA: Diagnosis not present

## 2023-04-08 DIAGNOSIS — N529 Male erectile dysfunction, unspecified: Secondary | ICD-10-CM | POA: Diagnosis not present

## 2023-04-08 DIAGNOSIS — D696 Thrombocytopenia, unspecified: Secondary | ICD-10-CM | POA: Diagnosis not present

## 2023-04-08 DIAGNOSIS — I130 Hypertensive heart and chronic kidney disease with heart failure and stage 1 through stage 4 chronic kidney disease, or unspecified chronic kidney disease: Secondary | ICD-10-CM | POA: Diagnosis not present

## 2023-04-08 DIAGNOSIS — I471 Supraventricular tachycardia, unspecified: Secondary | ICD-10-CM | POA: Diagnosis not present

## 2023-04-08 DIAGNOSIS — Z7901 Long term (current) use of anticoagulants: Secondary | ICD-10-CM | POA: Diagnosis not present

## 2023-04-08 DIAGNOSIS — Z856 Personal history of leukemia: Secondary | ICD-10-CM | POA: Diagnosis not present

## 2023-04-08 DIAGNOSIS — Z95811 Presence of heart assist device: Secondary | ICD-10-CM | POA: Diagnosis not present

## 2023-04-08 DIAGNOSIS — I255 Ischemic cardiomyopathy: Secondary | ICD-10-CM | POA: Diagnosis not present

## 2023-04-24 ENCOUNTER — Other Ambulatory Visit (HOSPITAL_COMMUNITY): Payer: Self-pay | Admitting: *Deleted

## 2023-04-24 DIAGNOSIS — Z95811 Presence of heart assist device: Secondary | ICD-10-CM

## 2023-04-24 DIAGNOSIS — H26493 Other secondary cataract, bilateral: Secondary | ICD-10-CM | POA: Diagnosis not present

## 2023-04-24 DIAGNOSIS — Z7901 Long term (current) use of anticoagulants: Secondary | ICD-10-CM

## 2023-04-24 DIAGNOSIS — Z83518 Family history of other specified eye disorder: Secondary | ICD-10-CM | POA: Diagnosis not present

## 2023-04-27 ENCOUNTER — Ambulatory Visit (HOSPITAL_COMMUNITY): Payer: Self-pay | Admitting: Pharmacist

## 2023-04-27 ENCOUNTER — Other Ambulatory Visit (HOSPITAL_COMMUNITY)
Admission: RE | Admit: 2023-04-27 | Discharge: 2023-04-27 | Disposition: A | Discharge status: Home / Self Care | Payer: Medicare PPO | Source: Ambulatory Visit | Attending: Cardiology | Admitting: Cardiology

## 2023-04-27 DIAGNOSIS — Z95811 Presence of heart assist device: Secondary | ICD-10-CM

## 2023-04-27 DIAGNOSIS — Z7901 Long term (current) use of anticoagulants: Secondary | ICD-10-CM | POA: Insufficient documentation

## 2023-04-27 LAB — PROTIME-INR
INR: 2.2 — ABNORMAL HIGH (ref 0.8–1.2)
Prothrombin Time: 24.4 s — ABNORMAL HIGH (ref 11.4–15.2)

## 2023-04-27 MED ORDER — DIGOXIN 62.5 MCG PO TABS
1.0000 | ORAL_TABLET | Freq: Every day | ORAL | 3 refills | Status: DC
Start: 2023-04-27 — End: 2024-06-22

## 2023-04-27 MED ORDER — WARFARIN SODIUM 5 MG PO TABS: ORAL_TABLET | ORAL | 3 refills | Status: DC

## 2023-05-06 ENCOUNTER — Other Ambulatory Visit (HOSPITAL_COMMUNITY): Payer: Self-pay

## 2023-05-06 DIAGNOSIS — Z7901 Long term (current) use of anticoagulants: Secondary | ICD-10-CM

## 2023-05-06 DIAGNOSIS — Z95811 Presence of heart assist device: Secondary | ICD-10-CM

## 2023-05-08 ENCOUNTER — Ambulatory Visit: Payer: Medicare PPO | Attending: Cardiology | Admitting: Cardiology

## 2023-05-11 ENCOUNTER — Ambulatory Visit (HOSPITAL_COMMUNITY): Payer: Self-pay | Admitting: Pharmacist

## 2023-05-11 ENCOUNTER — Other Ambulatory Visit (HOSPITAL_COMMUNITY)
Admission: RE | Admit: 2023-05-11 | Discharge: 2023-05-11 | Disposition: A | Discharge status: Home / Self Care | Payer: Medicare PPO | Source: Ambulatory Visit | Attending: Cardiology | Admitting: Cardiology

## 2023-05-11 ENCOUNTER — Telehealth (HOSPITAL_COMMUNITY): Payer: Self-pay | Admitting: Pharmacist

## 2023-05-11 DIAGNOSIS — Z95811 Presence of heart assist device: Secondary | ICD-10-CM | POA: Diagnosis not present

## 2023-05-11 DIAGNOSIS — Z7901 Long term (current) use of anticoagulants: Secondary | ICD-10-CM | POA: Insufficient documentation

## 2023-05-11 LAB — PROTIME-INR
INR: 2 — ABNORMAL HIGH (ref 0.8–1.2)
Prothrombin Time: 23 s — ABNORMAL HIGH (ref 11.4–15.2)

## 2023-05-11 NOTE — Telephone Encounter (Signed)
Entered in error

## 2023-05-13 DIAGNOSIS — I509 Heart failure, unspecified: Secondary | ICD-10-CM | POA: Diagnosis not present

## 2023-05-13 DIAGNOSIS — I10 Essential (primary) hypertension: Secondary | ICD-10-CM | POA: Diagnosis not present

## 2023-05-13 DIAGNOSIS — Z299 Encounter for prophylactic measures, unspecified: Secondary | ICD-10-CM | POA: Diagnosis not present

## 2023-05-13 DIAGNOSIS — E261 Secondary hyperaldosteronism: Secondary | ICD-10-CM | POA: Diagnosis not present

## 2023-05-13 DIAGNOSIS — I428 Other cardiomyopathies: Secondary | ICD-10-CM | POA: Diagnosis not present

## 2023-05-13 DIAGNOSIS — I270 Primary pulmonary hypertension: Secondary | ICD-10-CM | POA: Diagnosis not present

## 2023-05-13 DIAGNOSIS — I152 Hypertension secondary to endocrine disorders: Secondary | ICD-10-CM | POA: Diagnosis not present

## 2023-05-13 DIAGNOSIS — E1159 Type 2 diabetes mellitus with other circulatory complications: Secondary | ICD-10-CM | POA: Diagnosis not present

## 2023-05-21 ENCOUNTER — Telehealth (HOSPITAL_COMMUNITY): Payer: Self-pay | Admitting: Unknown Physician Specialty

## 2023-05-21 ENCOUNTER — Ambulatory Visit (HOSPITAL_COMMUNITY): Payer: Self-pay | Admitting: Pharmacist

## 2023-05-21 DIAGNOSIS — Z7901 Long term (current) use of anticoagulants: Secondary | ICD-10-CM | POA: Diagnosis not present

## 2023-05-21 LAB — POCT INR: INR: 2.1 (ref 2.0–3.0)

## 2023-05-21 NOTE — Telephone Encounter (Signed)
Received call from pt inquiring about getting cortizone shots for his shoulder pain. Pt informed that he would need to let the doctor know about his coumadin and his INR goal. Pt states that he decided he is going to wait a while to see if pain subsides before he does anything. He will keep [place] informed.  Carlton Adam RN, BSN VAD Coordinator 24/7 Pager [phone]

## 2023-05-22 ENCOUNTER — Other Ambulatory Visit: Payer: Self-pay | Admitting: Unknown Physician Specialty

## 2023-05-22 ENCOUNTER — Other Ambulatory Visit (HOSPITAL_COMMUNITY): Payer: Self-pay | Admitting: Unknown Physician Specialty

## 2023-05-22 DIAGNOSIS — Z7901 Long term (current) use of anticoagulants: Secondary | ICD-10-CM

## 2023-05-22 DIAGNOSIS — Z95811 Presence of heart assist device: Secondary | ICD-10-CM

## 2023-05-25 ENCOUNTER — Ambulatory Visit (HOSPITAL_COMMUNITY)
Admission: RE | Admit: 2023-05-25 | Discharge: 2023-05-25 | Disposition: A | Discharge status: Home / Self Care | Payer: Medicare PPO | Source: Ambulatory Visit | Attending: Cardiology | Admitting: Cardiology

## 2023-05-25 ENCOUNTER — Ambulatory Visit (HOSPITAL_COMMUNITY): Payer: Self-pay | Admitting: Pharmacist

## 2023-05-25 VITALS — BP 112/0 | HR 78 | Ht 69.0 in | Wt 186.4 lb

## 2023-05-25 DIAGNOSIS — C9241 Acute promyelocytic leukemia, in remission: Secondary | ICD-10-CM | POA: Insufficient documentation

## 2023-05-25 DIAGNOSIS — I10 Essential (primary) hypertension: Secondary | ICD-10-CM | POA: Diagnosis not present

## 2023-05-25 DIAGNOSIS — I428 Other cardiomyopathies: Secondary | ICD-10-CM | POA: Diagnosis not present

## 2023-05-25 DIAGNOSIS — Z7901 Long term (current) use of anticoagulants: Secondary | ICD-10-CM

## 2023-05-25 DIAGNOSIS — Z79899 Other long term (current) drug therapy: Secondary | ICD-10-CM | POA: Insufficient documentation

## 2023-05-25 DIAGNOSIS — I493 Ventricular premature depolarization: Secondary | ICD-10-CM | POA: Diagnosis not present

## 2023-05-25 DIAGNOSIS — Z8679 Personal history of other diseases of the circulatory system: Secondary | ICD-10-CM | POA: Insufficient documentation

## 2023-05-25 DIAGNOSIS — E1140 Type 2 diabetes mellitus with diabetic neuropathy, unspecified: Secondary | ICD-10-CM | POA: Diagnosis not present

## 2023-05-25 DIAGNOSIS — I5022 Chronic systolic (congestive) heart failure: Secondary | ICD-10-CM | POA: Insufficient documentation

## 2023-05-25 DIAGNOSIS — I2510 Atherosclerotic heart disease of native coronary artery without angina pectoris: Secondary | ICD-10-CM | POA: Insufficient documentation

## 2023-05-25 DIAGNOSIS — I110 Hypertensive heart disease with heart failure: Secondary | ICD-10-CM | POA: Insufficient documentation

## 2023-05-25 DIAGNOSIS — Z95811 Presence of heart assist device: Secondary | ICD-10-CM

## 2023-05-25 LAB — COMPREHENSIVE METABOLIC PANEL
ALT: 26 U/L (ref 0–44)
AST: 21 U/L (ref 15–41)
Albumin: 4.4 g/dL (ref 3.5–5.0)
Alkaline Phosphatase: 63 U/L (ref 38–126)
Anion gap: 12 (ref 5–15)
BUN: 20 mg/dL (ref 8–23)
CO2: 23 mmol/L (ref 22–32)
Calcium: 9.8 mg/dL (ref 8.9–10.3)
Chloride: 103 mmol/L (ref 98–111)
Creatinine, Ser: 0.94 mg/dL (ref 0.61–1.24)
GFR, Estimated: >60 mL/min (ref >60)
Glucose, Bld: 123 mg/dL — ABNORMAL HIGH (ref 70–99)
Potassium: 4.7 mmol/L (ref 3.5–5.1)
Sodium: 138 mmol/L (ref 135–145)
Total Bilirubin: 0.9 mg/dL (ref 0.3–1.2)
Total Protein: 7.9 g/dL (ref 6.5–8.1)

## 2023-05-25 LAB — PROTIME-INR
INR: 2.1 — ABNORMAL HIGH (ref 0.8–1.2)
Prothrombin Time: 23.6 s — ABNORMAL HIGH (ref 11.4–15.2)

## 2023-05-25 LAB — CBC
HCT: 47.5 % (ref 39.0–52.0)
Hemoglobin: 16.5 g/dL (ref 13.0–17.0)
MCH: 29.5 pg (ref 26.0–34.0)
MCHC: 34.7 g/dL (ref 30.0–36.0)
MCV: 85 fL (ref 80.0–100.0)
Platelets: 210 10*3/uL (ref 150–400)
RBC: 5.59 MIL/uL (ref 4.22–5.81)
RDW: 14.9 % (ref 11.5–15.5)
WBC: 5.5 10*3/uL (ref 4.0–10.5)
nRBC: 0 % (ref 0.0–0.2)

## 2023-05-25 LAB — DIGOXIN LEVEL: Digoxin Level: 0.4 ng/mL — ABNORMAL LOW (ref 0.8–2.0)

## 2023-05-25 LAB — LACTATE DEHYDROGENASE: LDH: 198 U/L — ABNORMAL HIGH (ref 98–192)

## 2023-05-25 LAB — PREALBUMIN: Prealbumin: 28 mg/dL (ref 18–38)

## 2023-05-25 MED ORDER — FUROSEMIDE 20 MG PO TABS: 20.0000 mg | ORAL_TABLET | ORAL | Status: AC | PRN

## 2023-05-25 NOTE — Addendum Note (Signed)
Encounter addended by: Lebron Quam, RN on: 05/25/2023 1:53 PM  Actions taken: Charge Capture section accepted

## 2023-05-25 NOTE — Progress Notes (Addendum)
Patient presents for 2 mo f/u visit w/ 6 mo Intermacs in VAD Clinic today with his wife Erie Noe. Reports no problems with VAD equipment or concerns with drive line.  Patient states he feels great has started the maintenance program with Cardiac Rehab 3 days a week at [facility]. Reports increase in overall stamina with regular exercise. Reports baseline intermittent symptoms of lightheadedness/dizziness when standing too quickly on the days that he takes Lasix. Denies falls, shortness of breath, and signs of bleeding. Reports he has had a good appetite and is able to do more around the house.  Pt taking all medications as prescribed. Medications reviewed with Dr. Gasper Lloyd. Pt gets blood pressure checked at cardiac rehab three times a week. Pt will keep a BP log this week and return next week for a BP check and BMET as we are stopping potassium supplement today.  Vital Signs:  Automatic BP:103/81 (89) Doppler Pressure: 112 HR: 78 freq PVCs SPO2: 98%    Weight: 186.4 lb w/ eqt Last weight: 189 lb w/ eqt  VAD Indication: Destination Therapy for age   VAD interrogation & Equipment Management: Speed: 5500 Flow: 3.6 Power: 4.2 w    PI: 6.1   Alarms: none Events: 10 - 20 PI events  Fixed speed 5500 Low speed limit: 5200   Primary Controller:  Replace back up battery in 24 months. Back up controller:  Replace back up battery in 22 months.   Annual Equipment Maintenance on UBC/PM was performed on 10/07/22.    I reviewed the LVAD parameters from today and compared the results to the patient's prior recorded data. LVAD interrogation was NEGATIVE for significant power changes, NEGATIVE for clinical alarms and STABLE for PI events/speed drops. No programming changes were made and pump is functioning within specified parameters. Pt is performing daily controller and system monitor self tests along with completing weekly and monthly maintenance for LVAD equipment.   LVAD equipment check  completed and is in good working order. Back-up equipment present. Charged back up battery and performed self-test on equipment.   Exit Site Care: CDI. Drive line anchor secure. Pt denies fever or chills. Wife Erie Noe placing sterile 2x2 under drive line to prevent pressure injury. Pt given 8 weekly dressing kits for home use and a 2 boxes of large tegaderms.   Device: none  BP & Labs: MAP 112 - Doppler is reflecting modified systolic    Hgb 16.5 - No S/S of bleeding. Specifically denies melena/BRBPR or nosebleeds.   LDH stable at 198 with established baseline of 170 - 208. Denies tea-colored urine. No power elevations noted on interrogation.   6 mo. Intermacs follow up completed including:  Quality of Life, KCCQ-12, and Neurocognitive trail making.   Pt completed 1550 feet during 6 minute walk.  Back up controller:  11V backup battery charged during this visit.  Patient Goals: To be present with my grandchildren and wife  [place] Cardiomyopathy Questionnaire     05/25/2023   11:37 AM 12/25/2022    3:08 PM  KCCQ-12  1 a. Ability to shower/bathe Not at all limited Slightly limited  1 b. Ability to walk 1 block Not at all limited Not at all limited  1 c. Ability to hurry/jog Not at all limited Other, Did not do  2. Edema feet/ankles/legs Never over the past 2 weeks Less than once a week  3. Limited by fatigue Never over the past 2 weeks Never over the past 2 weeks  4. Limited by  dyspnea Never over the past 2 weeks Never over the past 2 weeks  5. Sitting up / on 3+ pillows Never over the past 2 weeks Never over the past 2 weeks  6. Limited enjoyment of life Not limited at all Not limited at all  7. Rest of life w/ symptoms Completely satisfied Completely satisfied  8 a. Participation in hobbies Slightly limited Slightly limited  8 b. Participation in chores Did not limit at all Did not limit at all  8 c. Visiting family/friends Did not limit at all Did not limit at all      Plan: Stop Lasix-only take as needed Stop potassium Coumadin dosing per Leotis Shames [facility] Return to clinic in 1 wk for a BP check. Please bring BP log to this visit. Return in 2 months to see Dr Darlina Rumpf RN,BSN VAD Coordinator  Office: [phone]  24/7 Pager: [phone]

## 2023-05-25 NOTE — Patient Instructions (Signed)
Stop Lasix-only take as needed Stop potassium Coumadin dosing per Leotis Shames [facility] Return to clinic in 1 wk for a BP check Return in 2 months to see Dr Gasper Lloyd

## 2023-05-25 NOTE — Progress Notes (Signed)
ADVANCED HEART FAILURE CLINIC NOTE  Referring Physician: Ignatius Specking, MD  Primary Care: Ignatius Specking, MD Primary Cardiologist: Patric Dykes Heart Failure: Ricardo Zuniga  HPI: Ricardo Zuniga is a very pleasant 72 YO WM w/ HFrEF 2/2 nonischemic cardiomyopathy, T2DM and hx of acute promyelocytic leukemia s/p ATRA ([facility] 2014) that was initially see at [facility] in October 2023 after presenting with a 1 week history of orthopnea, PND & LE edema. He had a TTE prior to admission w/ LVEF of 40-45%. Echo during admit was notable for LVEF of 30%-35%. LHC/RHC during admit w/ nonobstructive CAD & moderately reduced CI (2.1 L/min/m2). His cardiac history dates back to 2015 when he was found to have an LVEF of 25% felt to be triggered by endocarditis. He had improvement in LV function for several years, however, unfortunately had a fairly rapid decline in LVEF and functional status over the winter months of 2023. The decision was made to proceed with HMIII implantation. Ricardo Zuniga underwent successful implant on 73/14/24 as DT. His post-operative course was fairly unremarkable. He had rapid improvement in PA pressures after HMIII implantation.   Interval hx: - Feels very well. He is very active now. Traveling, driving with no difficulties.  - Reports having lightheadedness on days that he takes lasix.   Past Medical History:  Diagnosis Date   Anal fissure    APML (acute promyelocytic leukemia) in remission (HCC)    Completed treatment 04/2013   CHF (congestive heart failure) (HCC)    Coronary artery disease    Nonobstructive at cardiac catheterization 09/2018   Difficult intubation    w/shoulder surgery at surgical center between 2005-2008   Gastroesophageal reflux disease    History of blood transfusion 2014   History of kidney stones    passed stones   Hypercholesteremia    Neuropathy    bilateral feet   Nonischemic cardiomyopathy (HCC)    a. EF 20% in 2015 - thought to  possibly be viral induced or chemotherapy induced from treatments in 2014 b. at 35-40% by echo in 05/2016    Peptic ulcer disease    Pneumonia    Yrs ago   Type 2 diabetes mellitus (HCC)    Ureteral colic    Vertigo    tx with valium prn   Wears glasses    Wears hearing aid in both ears     Current Outpatient Medications  Medication Sig Dispense Refill   acetaminophen (TYLENOL) 650 MG CR tablet Take 650-1,300 mg by mouth every 8 (eight) hours as needed for pain.     atorvastatin (LIPITOR) 40 MG tablet Take 1 tablet (40 mg total) by mouth daily. 90 tablet 1   Cholecalciferol (VITAMIN D3) 125 MCG (5000 UT) TABS Take 5,000 Units by mouth in the morning.     Digoxin 62.5 MCG TABS Take 1 tablet by mouth daily. 90 tablet 3   docusate sodium (COLACE) 100 MG capsule Take 200 mg by mouth every evening.     empagliflozin (JARDIANCE) 10 MG TABS tablet Take 10 mg by mouth every evening.     omeprazole (PRILOSEC) 20 MG capsule Take 20 mg by mouth daily before breakfast.     repaglinide (PRANDIN) 2 MG tablet Take 2-4 mg by mouth See admin instructions. Take 2 tablets (4 mg) by mouth in the morning & take 1 tablet (2 mg) by mouth in the evening.     traZODone (DESYREL) 100 MG tablet Take 1 tablet (100 mg  total) by mouth at bedtime as needed for sleep. 30 tablet 3   warfarin (COUMADIN) 5 MG tablet Take 5 mg (1 tab) daily or as directed by HF Clinic 90 tablet 3   amoxicillin (AMOXIL) 500 MG capsule Take 4 capsules (2000 mg) 30 minutes to an hour prior to dental cleaning (Patient not taking: Reported on 05/25/2023) 4 capsule 0   enoxaparin (LOVENOX) 40 MG/0.4ML injection Inject 0.4 mLs (40 mg total) into the skin every 12 (twelve) hours for 10 doses. Take for 2 days, then await further instructions by the clinic (Patient not taking: Reported on 05/25/2023) 4 mL 0   furosemide (LASIX) 20 MG tablet Take 1 tablet (20 mg total) by mouth as needed.     No current facility-administered medications for this  encounter.    Allergies  Allergen Reactions   Zestril [Lisinopril] Cough      Social History   Socioeconomic History   Marital status: Married    Spouse name: Not on file   Number of children: Not on file   Years of education: Not on file   Highest education level: Not on file  Occupational History   Occupation: RETIRED    Comment: [name]   Occupation: SECURITY GUARD  Tobacco Use   Smoking status: Former    Current packs/day: 0.00    Average packs/day: 2.0 packs/day for 18.0 years (36.1 ttl pk-yrs)    Types: Cigarettes    Start date: 02/17/1968    Quit date: 02/28/1986    Years since quitting: 37.2    Passive exposure: Never   Smokeless tobacco: Never  Vaping Use   Vaping status: Never Used  Substance and Sexual Activity   Alcohol use: No    Alcohol/week: 0.0 standard drinks of alcohol   Drug use: No   Sexual activity: Not Currently  Other Topics Concern   Not on file  Social History Narrative   Not on file   Social Determinants of Health   Financial Resource Strain: Low Risk  (09/03/2022)   Overall Financial Resource Strain (CARDIA)    Difficulty of Paying Living Expenses: Not hard at all  Food Insecurity: No Food Insecurity (09/23/2022)   Hunger Vital Sign    Worried About Running Out of Food in the Last Year: Never true    Ran Out of Food in the Last Year: Never true  Transportation Needs: No Transportation Needs (09/23/2022)   PRAPARE - Administrator, Civil Service (Medical): No    Lack of Transportation (Non-Medical): No  Physical Activity: Not on file  Stress: Not on file  Social Connections: Not on file  Intimate Partner Violence: Not At Risk (09/23/2022)   Humiliation, Afraid, Rape, and Kick questionnaire    Fear of Current or Ex-Partner: No    Emotionally Abused: No    Physically Abused: No    Sexually Abused: No      Family History  Problem Relation Age of Onset   Colon cancer Other     PHYSICAL EXAM: Vital Signs:   Automatic BP:103/81 (89) Doppler Pressure: 112 HR: 78 freq PVCs SPO2: 98%    Weight: 186.4 lb w/ eqt Last weight: 189 lb w/ eqt   VAD Indication: Destination Therapy for age   VAD interrogation & Equipment Management: Speed: 5500 Flow: 3.6 Power: 4.2 w    PI: 6.1   Alarms: none Events: 10 - 20 PI events  Fixed speed 5500 Low speed limit: 5200 I personally interrogated the LVAD  and reviewed.    General: NAD, no complaints today.  Neck: JVP 5-6cm Lungs: CTA B/L CV: Normal LVAD hum;  Abdomen: soft, nontender, no erythema or induration at driveline site.  Skin: Intact without lesions or rashes.  Neurologic: Alert and oriented x 3.  Psych: Normal affect. Extremities: Warm to touch; no edema.  HEENT: Normal.   ECHO: - Echo (06/08/22): EF 30-35%, global HK, RV mildly reduced, mild MR   - Echo (7/23): EF 45%, RV normal   - Echo (8/22): EF 40-45%, RV normal   - Echo (7/21): EF 50%, RV normal  - LHC (09/2018): Moderate nonobstructive one-vessel coronary artery disease, prox RCA 40% stenosed.   - Echo (08/2018): EF 40%, RV normal  - Echo (05/2016): EF 35-40%, RV normal   - Echo (05/2014): EF 25-30%, RV mildly reduced  - Echo (05/2012): EF 50%, RV normal  - Echo (1/24): EF 25%, severe LV dilation, global hypokinesis, moderate RV dysfunction, mild RV enlargement, moderate AI, mild-moderate MR. - Echo (11/27/22): HMIII LVAD in place. Ramp study from 5500RPM-5800RPM; trivial MR throughout. Septum midline. RV enlarged but with preserved function.   CATH: R/LHC (10/23): nonobstructive CAD RA 8 PA 52/25 (34 mean) PCWP 14 (mean) CO/CI (Fick): 4.3/2.1                                 PVR 4.6 WU          PAPi 3.4             RHC (1/24): RA mean 10 PA 69/28, mean 43 PCWP mean 20 CI 1.85 PVR 6.22 WU PAPi 4.1     ASSESSMENT & PLAN:  1. Chronic systolic heart failure s/p HMIII Destination therapy - Significant improvement in functional status since HMIII placement. Doing  very well functionall.  -Continue digoxin 62.5 mcg daily. Dig level 0.4 today.  -continue jardiance 10mg  daily.  -Reduce lasix 20mg  to PRN only now.  -Hypertensive today; however, he reports that Bps are usually well controlled. Will send [place] a BP log. If they are truly elevated will start losartan 12.5mg  daily.  - INR 2.1; discussed with PharmD.  -LDH stable at 198 today.   2. Hx of atrial tach, PVCs & NSVT -Resolved on Zio patch.  Amiodarone discontinued -Today he is having some PVCs; will continue to monitor. Asymptomatic.   3. Hx of promyelocytic leukemia: S/P [facility]TRA in 2014 at [facility].   4. Hx of endocarditis: 2015  5. Hypertension - Will send in ambulatory BP log  - Start low dose losartan if MAPs remain > 90  I reviewed the LVAD parameters from today and compared the results to the patient's prior recorded data. LVAD interrogation was NEGATIVE for significant power changes, NEGATIVE for clinical alarms and STABLE for PI events/speed drops. No programming changes were made and pump is functioning within specified parameters. Pt is performing daily controller and system monitor self tests along with completing weekly and monthly maintenance for LVAD equipment.   I spent 40 minutes caring for this patient today including face to face time, ordering and reviewing labs, reviewing LVAD logs, discussing with VAD coordinator, seeing the patient, counseling the patient, documenting in the record, and arranging follow ups. Discussed INR with pharmD.    Bridget [place] 05/25/2023 11:57 AM

## 2023-05-28 ENCOUNTER — Encounter (HOSPITAL_COMMUNITY): Payer: Medicare PPO | Admitting: Cardiology

## 2023-05-29 ENCOUNTER — Other Ambulatory Visit (HOSPITAL_COMMUNITY): Payer: Self-pay | Admitting: Unknown Physician Specialty

## 2023-05-29 DIAGNOSIS — Z95811 Presence of heart assist device: Secondary | ICD-10-CM

## 2023-06-01 ENCOUNTER — Ambulatory Visit (HOSPITAL_COMMUNITY)
Admission: RE | Admit: 2023-06-01 | Discharge: 2023-06-01 | Disposition: A | Discharge status: Home / Self Care | Payer: Medicare PPO | Source: Ambulatory Visit | Attending: Cardiology | Admitting: Cardiology

## 2023-06-01 ENCOUNTER — Ambulatory Visit (HOSPITAL_COMMUNITY): Payer: Self-pay | Admitting: Pharmacist

## 2023-06-01 DIAGNOSIS — I5022 Chronic systolic (congestive) heart failure: Secondary | ICD-10-CM

## 2023-06-01 DIAGNOSIS — Z95811 Presence of heart assist device: Secondary | ICD-10-CM | POA: Diagnosis not present

## 2023-06-01 DIAGNOSIS — Z4801 Encounter for change or removal of surgical wound dressing: Secondary | ICD-10-CM | POA: Diagnosis not present

## 2023-06-01 LAB — BASIC METABOLIC PANEL
Anion gap: 9 (ref 5–15)
BUN: 19 mg/dL (ref 8–23)
CO2: 22 mmol/L (ref 22–32)
Calcium: 9.4 mg/dL (ref 8.9–10.3)
Chloride: 105 mmol/L (ref 98–111)
Creatinine, Ser: 0.8 mg/dL (ref 0.61–1.24)
GFR, Estimated: >60 mL/min (ref >60)
Glucose, Bld: 109 mg/dL — ABNORMAL HIGH (ref 70–99)
Potassium: 4.2 mmol/L (ref 3.5–5.1)
Sodium: 136 mmol/L (ref 135–145)

## 2023-06-01 LAB — POCT INR: INR: 1.9 — AB (ref 2.0–3.0)

## 2023-06-01 NOTE — Progress Notes (Signed)
Patient presents for BMET and BP check in VAD Clinic today with his wife Erie Noe. Reports no problems with VAD equipment or concerns with drive line.  Patient states he feels great has started the maintenance program with Cardiac Rehab 3 days a week at [facility]. Denies falls, shortness of breath, and signs of bleeding. Has not had to take any Lasix recently.  Pt brought home/cardiac rehab BP log for VAD coordinator review. BP/MAPs stable. BP taken today- documented below. Will not start additional medications at this time.   BMET checked today as we are stopped potassium supplement at last appt. Result pending.   Vital Signs:  Automatic BP: 110/95 (101) on arrival; repeat 5 minutes later: 94/75 (83) HR: 88 freq PVCs SPO2: 97%    VAD interrogation & Equipment Management: Speed: 5500 Flow: 3.9 Power: 4.3 w    PI: 3.8   Alarms: none Events: 10 - 20 PI events  Fixed speed 5500 Low speed limit: 5200   Primary Controller:  Replace back up battery in 24 months.   Plan: Return for previously scheduled follow up appt  Alyce Pagan RN VAD Coordinator  Office: [phone]  24/7 Pager: [phone]

## 2023-06-05 DIAGNOSIS — M25512 Pain in left shoulder: Secondary | ICD-10-CM | POA: Diagnosis not present

## 2023-06-08 ENCOUNTER — Ambulatory Visit (HOSPITAL_COMMUNITY): Payer: Self-pay | Admitting: Pharmacist

## 2023-06-08 DIAGNOSIS — Z7901 Long term (current) use of anticoagulants: Secondary | ICD-10-CM | POA: Diagnosis not present

## 2023-06-08 LAB — POCT INR: INR: 2 (ref 2.0–3.0)

## 2023-06-14 NOTE — Progress Notes (Unsigned)
Cardiology Office Note  Date: 06/15/2023   ID: Ricardo Zuniga, DOB 10/20/1950, MRN [id number]  History of Present Illness: Ricardo Zuniga is a 72 y.o. male seen in October by Dr. Gasper Lloyd in the heart failure clinic.  He has a HeartMate III LVAD in place.  He is here with his wife for a follow-up visit.  Continues to do very well, exercising in the maintenance cardiac rehab program 3 days a week at [facility].  Reports NYHA class I-II symptoms.  No significant weight change or fluid retention.  No progressive palpitations or sudden syncope.  No orthopnea or PND.  Most recent medication adjustments include reduction in Lasix to 20 mg as needed.  He was taken off amiodarone since I saw him as well.  Otherwise remains on Lipitor, Lanoxin, Jardiance, and Coumadin.  Recent INR 2.0.  I reviewed his most recent lab work.  Physical Exam: VS:  Pulse 68   Ht 5' 9.5" (1.765 m)   Wt 191 lb (86.6 kg)   SpO2 100%   BMI 27.80 kg/m , BMI Body mass index is 27.8 kg/m.  Wt Readings from Last 3 Encounters:  06/15/23 191 lb (86.6 kg)  05/25/23 186 lb 6.4 oz (84.6 kg)  04/06/23 189 lb (85.7 kg)    General: Patient appears comfortable at rest. HEENT: Conjunctiva and lids normal. Neck: Supple, no elevated JVP or carotid bruits. Lungs: Clear to auscultation, nonlabored breathing at rest. Cardiac: LVAD hum. Extremities: No pitting edema.  ECG:  An ECG dated 09/25/2022 was personally reviewed today and demonstrated:  Regular ventricular activity at 84 bpm with underlying electrical noise from LVAD.  Labwork: 09/01/2022: TSH 2.329 09/30/2022: B Natriuretic Peptide 969.5 10/07/2022: Magnesium 2.2 05/25/2023: ALT 26; AST 21; Hemoglobin 16.5; Platelets 210 06/01/2023: BUN 19; Creatinine, Ser 0.80; Potassium 4.2; Sodium 136     Component Value Date/Time   CHOL 134 09/02/2022 0547   TRIG 75 09/02/2022 0547   HDL 34 (L) 09/02/2022 0547   CHOLHDL 3.9 09/02/2022 0547   VLDL 15 09/02/2022 0547    LDLCALC 85 09/02/2022 0547   Other Studies Reviewed Today:  Echocardiogram 04/06/2023:  1. LVAD HeartMate III ramp test 5500 to 5700. AV leaflet movement noted  at 5700. Left ventricular ejection fraction, by estimation, is <20%. The  left ventricle has severely decreased function. The left ventricular  internal cavity size was mildly  dilated.   2. Right ventricular systolic function is moderately reduced. The right  ventricular size is mildly enlarged. There is normal pulmonary artery  systolic pressure. The estimated right ventricular systolic pressure is  19.0 mmHg.   3. The aortic valve is tricuspid. There is mild calcification of the  aortic valve. There is mild thickening of the aortic valve. Aortic valve  regurgitation is trivial. Aortic valve sclerosis is present, with no  evidence of aortic valve stenosis.   Assessment and Plan:  1.  HFrEF with nonischemic cardiomyopathy, LVEF <20% status post Heartmate III implantation as destination therapy in February of this year. He is on Coumadin.  Doing very well with NYHA class I-II symptoms, no fluid retention or significant weight change.  Continues in maintenance cardiac rehabilitation program 3 days a week.  Keep follow-up in the heart failure clinic, no medication changes were made today.  He remains on Jardiance, Lanoxin, and as needed Lasix.   2. Atrial tachycardia and NSVT, taken off of Amiodarone and clinically stable.  ZIO monitor in May indicated sinus rhythm with single 4  beat burst of NSVT, frequent PVCs representing 9% total beats, and otherwise rare atrial ectopy.   3. Nonobstructive CAD.  Continue Lipitor.  Disposition:  Follow up  heart failure clinic in December, 6 months here.  Signed, Jonelle Sidle, M.D., F.A.C.C. [facility]

## 2023-06-15 ENCOUNTER — Encounter: Payer: Self-pay | Admitting: Cardiology

## 2023-06-15 ENCOUNTER — Ambulatory Visit (HOSPITAL_COMMUNITY): Payer: Self-pay | Admitting: Pharmacist

## 2023-06-15 ENCOUNTER — Ambulatory Visit: Payer: Medicare PPO | Attending: Cardiology | Admitting: Cardiology

## 2023-06-15 VITALS — HR 68 | Ht 69.5 in | Wt 191.0 lb

## 2023-06-15 DIAGNOSIS — Z95811 Presence of heart assist device: Secondary | ICD-10-CM | POA: Diagnosis not present

## 2023-06-15 DIAGNOSIS — I428 Other cardiomyopathies: Secondary | ICD-10-CM

## 2023-06-15 DIAGNOSIS — I5020 Unspecified systolic (congestive) heart failure: Secondary | ICD-10-CM

## 2023-06-15 LAB — POCT INR: INR: 1.7 — AB (ref 2.0–3.0)

## 2023-06-15 NOTE — Patient Instructions (Addendum)

## 2023-06-17 DIAGNOSIS — Z1331 Encounter for screening for depression: Secondary | ICD-10-CM | POA: Diagnosis not present

## 2023-06-17 DIAGNOSIS — Z1339 Encounter for screening examination for other mental health and behavioral disorders: Secondary | ICD-10-CM | POA: Diagnosis not present

## 2023-06-17 DIAGNOSIS — I10 Essential (primary) hypertension: Secondary | ICD-10-CM | POA: Diagnosis not present

## 2023-06-17 DIAGNOSIS — I509 Heart failure, unspecified: Secondary | ICD-10-CM | POA: Diagnosis not present

## 2023-06-17 DIAGNOSIS — R5383 Other fatigue: Secondary | ICD-10-CM | POA: Diagnosis not present

## 2023-06-17 DIAGNOSIS — Z7189 Other specified counseling: Secondary | ICD-10-CM | POA: Diagnosis not present

## 2023-06-17 DIAGNOSIS — Z79899 Other long term (current) drug therapy: Secondary | ICD-10-CM | POA: Diagnosis not present

## 2023-06-17 DIAGNOSIS — Z0000 Encounter for general adult medical examination without abnormal findings: Secondary | ICD-10-CM | POA: Diagnosis not present

## 2023-06-17 DIAGNOSIS — Z125 Encounter for screening for malignant neoplasm of prostate: Secondary | ICD-10-CM | POA: Diagnosis not present

## 2023-06-17 DIAGNOSIS — Z299 Encounter for prophylactic measures, unspecified: Secondary | ICD-10-CM | POA: Diagnosis not present

## 2023-06-17 DIAGNOSIS — E7800 Pure hypercholesterolemia, unspecified: Secondary | ICD-10-CM | POA: Diagnosis not present

## 2023-06-22 ENCOUNTER — Other Ambulatory Visit (HOSPITAL_COMMUNITY): Payer: Self-pay | Admitting: *Deleted

## 2023-06-22 ENCOUNTER — Ambulatory Visit (HOSPITAL_COMMUNITY): Payer: Self-pay | Admitting: Pharmacist

## 2023-06-22 ENCOUNTER — Other Ambulatory Visit (HOSPITAL_COMMUNITY)
Admission: RE | Admit: 2023-06-22 | Discharge: 2023-06-22 | Disposition: A | Discharge status: Home / Self Care | Payer: Medicare PPO | Source: Ambulatory Visit | Attending: Cardiology | Admitting: Cardiology

## 2023-06-22 DIAGNOSIS — Z7901 Long term (current) use of anticoagulants: Secondary | ICD-10-CM | POA: Diagnosis not present

## 2023-06-22 DIAGNOSIS — Z95811 Presence of heart assist device: Secondary | ICD-10-CM | POA: Insufficient documentation

## 2023-06-22 LAB — PROTIME-INR
INR: 2.3 — ABNORMAL HIGH (ref 0.8–1.2)
Prothrombin Time: 25.9 s — ABNORMAL HIGH (ref 11.4–15.2)

## 2023-06-30 ENCOUNTER — Ambulatory Visit (HOSPITAL_COMMUNITY): Payer: Self-pay | Admitting: Pharmacist

## 2023-06-30 LAB — POCT INR: INR: 2.5 (ref 2.0–3.0)

## 2023-07-06 ENCOUNTER — Ambulatory Visit (HOSPITAL_COMMUNITY): Payer: Self-pay | Admitting: Pharmacist

## 2023-07-06 DIAGNOSIS — Z7901 Long term (current) use of anticoagulants: Secondary | ICD-10-CM | POA: Diagnosis not present

## 2023-07-06 LAB — POCT INR: INR: 2.2 (ref 2.0–3.0)

## 2023-07-13 ENCOUNTER — Ambulatory Visit (HOSPITAL_COMMUNITY): Payer: Self-pay | Admitting: Pharmacist

## 2023-07-13 LAB — POCT INR: INR: 1.9 — AB (ref 2.0–3.0)

## 2023-07-20 ENCOUNTER — Ambulatory Visit (HOSPITAL_COMMUNITY): Payer: Self-pay | Admitting: Pharmacist

## 2023-07-20 DIAGNOSIS — M7582 Other shoulder lesions, left shoulder: Secondary | ICD-10-CM | POA: Diagnosis not present

## 2023-07-20 LAB — POCT INR: INR: 2.1 (ref 2.0–3.0)

## 2023-07-22 NOTE — Progress Notes (Signed)
ADVANCED HEART FAILURE CLINIC NOTE  Referring Physician: Ignatius Specking, MD  Primary Care: Ignatius Specking, MD Primary Cardiologist: Patric Dykes Heart Failure: Dorthula Nettles  HPI: Ricardo Zuniga is a very pleasant 72 YO WM w/ HFrEF 2/2 nonischemic cardiomyopathy, T2DM and hx of acute promyelocytic leukemia s/p ATRA ([facility] 2014) that was initially see at [facility] in October 2023 after presenting with a 1 week history of orthopnea, PND & LE edema. He had a TTE prior to admission w/ LVEF of 40-45%. Echo during admit was notable for LVEF of 30%-35%. LHC/RHC during admit w/ nonobstructive CAD & moderately reduced CI (2.1 L/min/m2). His cardiac history dates back to 2015 when he was found to have an LVEF of 25% felt to be triggered by endocarditis. He had improvement in LV function for several years, however, unfortunately had a fairly rapid decline in LVEF and functional status over the winter months of 2023. The decision was made to proceed with HMIII implantation. Mr. Balan underwent successful implant on 09/24/22 as DT. His post-operative course was fairly unremarkable. He had rapid improvement in PA pressures after HMIII implantation.   Interval hx: - No issues since his last visit.  - Having 15-20 PI events daily however remains asymptomatic from a HFrEF standpoint.  - Only issue today is severe shoulder pain; seeing orthopedic surgery.  - Lightheadedness has improved significantly after discontinuing afterload reduction & diuretics.   Past Medical History:  Diagnosis Date   Anal fissure    APML (acute promyelocytic leukemia) in remission (HCC)    Completed treatment 04/2013   CHF (congestive heart failure) (HCC)    Coronary artery disease    Nonobstructive at cardiac catheterization 09/2018   Difficult intubation    w/shoulder surgery at surgical center between 2005-2008   Gastroesophageal reflux disease    History of blood transfusion 2014   History of kidney stones     passed stones   Hypercholesteremia    Neuropathy    bilateral feet   Nonischemic cardiomyopathy (HCC)    a. EF 20% in 2015 - thought to possibly be viral induced or chemotherapy induced from treatments in 2014 b. at 35-40% by echo in 05/2016    Peptic ulcer disease    Pneumonia    Yrs ago   Type 2 diabetes mellitus (HCC)    Ureteral colic    Vertigo    tx with valium prn   Wears glasses    Wears hearing aid in both ears     Current Outpatient Medications  Medication Sig Dispense Refill   acetaminophen (TYLENOL) 650 MG CR tablet Take 650-1,300 mg by mouth every 8 (eight) hours as needed for pain.     amoxicillin (AMOXIL) 500 MG capsule Take 4 capsules (2000 mg) 30 minutes to an hour prior to dental cleaning 4 capsule 0   atorvastatin (LIPITOR) 40 MG tablet Take 1 tablet (40 mg total) by mouth daily. 90 tablet 1   Cholecalciferol (VITAMIN D3) 125 MCG (5000 UT) TABS Take 5,000 Units by mouth in the morning.     Digoxin 62.5 MCG TABS Take 1 tablet by mouth daily. 90 tablet 3   docusate sodium (COLACE) 100 MG capsule Take 200 mg by mouth every evening.     empagliflozin (JARDIANCE) 10 MG TABS tablet Take 10 mg by mouth every evening.     enoxaparin (LOVENOX) 40 MG/0.4ML injection Inject 0.4 mLs (40 mg total) into the skin every 12 (twelve) hours for 10 doses.  Take for 2 days, then await further instructions by the clinic (Patient not taking: Reported on 05/25/2023) 4 mL 0   furosemide (LASIX) 20 MG tablet Take 1 tablet (20 mg total) by mouth as needed.     omeprazole (PRILOSEC) 20 MG capsule Take 20 mg by mouth daily before breakfast.     repaglinide (PRANDIN) 2 MG tablet Take 2-4 mg by mouth See admin instructions. Take 2 tablets (4 mg) by mouth in the morning & take 1 tablet (2 mg) by mouth in the evening.     traZODone (DESYREL) 100 MG tablet Take 1 tablet (100 mg total) by mouth at bedtime as needed for sleep. 30 tablet 3   warfarin (COUMADIN) 5 MG tablet Take 5 mg (1 tab) daily or  as directed by HF Clinic 90 tablet 3   No current facility-administered medications for this visit.    Allergies  Allergen Reactions   Zestril [Lisinopril] Cough      Social History   Socioeconomic History   Marital status: Married    Spouse name: Not on file   Number of children: Not on file   Years of education: Not on file   Highest education level: Not on file  Occupational History   Occupation: RETIRED    Comment: [name]   Occupation: SECURITY GUARD  Tobacco Use   Smoking status: Former    Current packs/day: 0.00    Average packs/day: 2.0 packs/day for 18.0 years (36.1 ttl pk-yrs)    Types: Cigarettes    Start date: 02/17/1968    Quit date: 02/28/1986    Years since quitting: 37.4    Passive exposure: Never   Smokeless tobacco: Never  Vaping Use   Vaping status: Never Used  Substance and Sexual Activity   Alcohol use: No    Alcohol/week: 0.0 standard drinks of alcohol   Drug use: No   Sexual activity: Not Currently  Other Topics Concern   Not on file  Social History Narrative   Not on file   Social Determinants of Health   Financial Resource Strain: Low Risk  (09/03/2022)   Overall Financial Resource Strain (CARDIA)    Difficulty of Paying Living Expenses: Not hard at all  Food Insecurity: No Food Insecurity (09/23/2022)   Hunger Vital Sign    Worried About Running Out of Food in the Last Year: Never true    Ran Out of Food in the Last Year: Never true  Transportation Needs: No Transportation Needs (09/23/2022)   PRAPARE - Administrator, Civil Service (Medical): No    Lack of Transportation (Non-Medical): No  Physical Activity: Not on file  Stress: Not on file  Social Connections: Not on file  Intimate Partner Violence: Not At Risk (09/23/2022)   Humiliation, Afraid, Rape, and Kick questionnaire    Fear of Current or Ex-Partner: No    Emotionally Abused: No    Physically Abused: No    Sexually Abused: No      Family History   Problem Relation Age of Onset   Colon cancer Other     PHYSICAL EXAM:  Vital Signs:  Automatic BP:105/93 (99) Doppler Pressure: 124 HR:  93 SPO2: 99%    Weight: 190.6 lbs lb w/ eqt Last weight: 186.4 lb w/ eqt   VAD Indication: Destination Therapy for age   VAD interrogation & Equipment Management: Speed: 5500 Flow: 3.8 Power: 4.2w    PI: 5.2   Alarms: none Events: 10 - 20 PI  events  Fixed speed 5500 Low speed limit: 5200  Vitals:   07/23/23 1153 07/23/23 1154  BP: (!) 105/93 (!) 124/0  Pulse: 93   SpO2: 99%    GENERAL: Well nourished, well developed, and in no apparent distress at rest.  HEENT: Negative for arcus senilis or xanthelasma. There is no scleral icterus.  The mucous membranes are pink and moist.   NECK: Supple, No masses. Normal carotid upstrokes without bruits. No masses or thyromegaly.    CHEST: There are no chest wall deformities. There is no chest wall tenderness. Respirations are unlabored.  Lungs- CTA B/L CARDIAC:  JVP: 5 cm          Normal LVAD hum; no peripheral edema ABDOMEN: Soft, non-tender, non-distended. There are no masses or hepatomegaly. There are normal bowel sounds.  EXTREMITIES: Warm and well perfused with no cyanosis, clubbing.  LYMPHATIC: No axillary or supraclavicular lymphadenopathy.  NEUROLOGIC: Patient is oriented x3 with no focal or lateralizing neurologic deficits.  PSYCH: Patients affect is appropriate, there is no evidence of anxiety or depression.  SKIN: Warm and dry; no lesions or wounds.    ECHO: - Echo (06/08/22): EF 30-35%, global HK, RV mildly reduced, mild MR   - Echo (7/23): EF 45%, RV normal   - Echo (8/22): EF 40-45%, RV normal   - Echo (7/21): EF 50%, RV normal  - LHC (09/2018): Moderate nonobstructive one-vessel coronary artery disease, prox RCA 40% stenosed.   - Echo (08/2018): EF 40%, RV normal  - Echo (05/2016): EF 35-40%, RV normal   - Echo (05/2014): EF 25-30%, RV mildly reduced  - Echo (05/2012): EF  50%, RV normal  - Echo (1/24): EF 25%, severe LV dilation, global hypokinesis, moderate RV dysfunction, mild RV enlargement, moderate AI, mild-moderate MR. - Echo (11/27/22): HMIII LVAD in place. Ramp study from 5500RPM-5800RPM; trivial MR throughout. Septum midline. RV enlarged but with preserved function.   CATH: R/LHC (10/23): nonobstructive CAD RA 8 PA 52/25 (34 mean) PCWP 14 (mean) CO/CI (Fick): 4.3/2.1                                 PVR 4.6 WU          PAPi 3.4             RHC (1/24): RA mean 10 PA 69/28, mean 43 PCWP mean 20 CI 1.85 PVR 6.22 WU PAPi 4.1     ASSESSMENT & PLAN:  1. Chronic systolic heart failure s/p HMIII Destination therapy - Significant improvement in functional status since HMIII placement. Doing very well functionall.  -Continue digoxin 62.5 mcg daily. Will obtain levels. -continue jardiance 10mg  daily.  -euvolemic to hypovolemic on exam; will continue lasix 20mg  PRN only. Repeat lab work today.  - Remains hypertensive today; will need to start low dose anti-HTN if BP remains elevated. Discussed with him today.  - INR 2.0; discussed with pharmD & LVAD coordinator -LDH stable at 193 today - TTE at follow up.   2. Hx of atrial tach, PVCs & NSVT -Resolved on Zio patch.  Amiodarone discontinued -Telemetry today w/o any PVCs.   3. Hx of promyelocytic leukemia: S/P ATRA in 2014 at [facility].   4. Hx of endocarditis: 2015  5. Hypertension - see above; MAPS >90; may require anti-HTN.   I reviewed the LVAD parameters from today and compared the results to the patient's prior recorded data. LVAD interrogation was NEGATIVE for  significant power changes, NEGATIVE for clinical alarms and STABLE for PI events/speed drops. No programming changes were made and pump is functioning within specified parameters. Pt is performing daily controller and system monitor self tests along with completing weekly and monthly maintenance for LVAD equipment.   I spent 40 minutes  caring for this patient today including face to face time, ordering and reviewing labs, reviewing LVAD logs, discussing with VAD coordinator, seeing the patient, counseling the patient, documenting in the record, and arranging follow ups. Discussed INR with pharmD.    Raenell Mensing 07/22/2023 5:07 PM

## 2023-07-23 ENCOUNTER — Ambulatory Visit (HOSPITAL_COMMUNITY): Payer: Self-pay | Admitting: Pharmacist

## 2023-07-23 ENCOUNTER — Ambulatory Visit (HOSPITAL_COMMUNITY)
Admission: RE | Admit: 2023-07-23 | Discharge: 2023-07-23 | Disposition: A | Discharge status: Home / Self Care | Payer: Medicare PPO | Source: Ambulatory Visit | Attending: Cardiology | Admitting: Cardiology

## 2023-07-23 ENCOUNTER — Other Ambulatory Visit (HOSPITAL_COMMUNITY): Payer: Self-pay | Admitting: *Deleted

## 2023-07-23 VITALS — BP 124/0 | HR 93 | Wt 190.6 lb

## 2023-07-23 DIAGNOSIS — Z7901 Long term (current) use of anticoagulants: Secondary | ICD-10-CM | POA: Insufficient documentation

## 2023-07-23 DIAGNOSIS — Z95811 Presence of heart assist device: Secondary | ICD-10-CM

## 2023-07-23 DIAGNOSIS — Z4509 Encounter for adjustment and management of other cardiac device: Secondary | ICD-10-CM | POA: Insufficient documentation

## 2023-07-23 DIAGNOSIS — C9241 Acute promyelocytic leukemia, in remission: Secondary | ICD-10-CM | POA: Insufficient documentation

## 2023-07-23 DIAGNOSIS — I110 Hypertensive heart disease with heart failure: Secondary | ICD-10-CM | POA: Insufficient documentation

## 2023-07-23 DIAGNOSIS — I5022 Chronic systolic (congestive) heart failure: Secondary | ICD-10-CM | POA: Insufficient documentation

## 2023-07-23 DIAGNOSIS — I5023 Acute on chronic systolic (congestive) heart failure: Secondary | ICD-10-CM

## 2023-07-23 DIAGNOSIS — I428 Other cardiomyopathies: Secondary | ICD-10-CM | POA: Insufficient documentation

## 2023-07-23 DIAGNOSIS — E1140 Type 2 diabetes mellitus with diabetic neuropathy, unspecified: Secondary | ICD-10-CM | POA: Insufficient documentation

## 2023-07-23 LAB — BASIC METABOLIC PANEL
Anion gap: 9 (ref 5–15)
BUN: 14 mg/dL (ref 8–23)
CO2: 26 mmol/L (ref 22–32)
Calcium: 9.8 mg/dL (ref 8.9–10.3)
Chloride: 104 mmol/L (ref 98–111)
Creatinine, Ser: 0.83 mg/dL (ref 0.61–1.24)
GFR, Estimated: >60 mL/min (ref >60)
Glucose, Bld: 93 mg/dL (ref 70–99)
Potassium: 4.8 mmol/L (ref 3.5–5.1)
Sodium: 139 mmol/L (ref 135–145)

## 2023-07-23 LAB — CBC
HCT: 45.1 % (ref 39.0–52.0)
Hemoglobin: 16.1 g/dL (ref 13.0–17.0)
MCH: 30.2 pg (ref 26.0–34.0)
MCHC: 35.7 g/dL (ref 30.0–36.0)
MCV: 84.6 fL (ref 80.0–100.0)
Platelets: 230 10*3/uL (ref 150–400)
RBC: 5.33 MIL/uL (ref 4.22–5.81)
RDW: 14.2 % (ref 11.5–15.5)
WBC: 7.2 10*3/uL (ref 4.0–10.5)
nRBC: 0 % (ref 0.0–0.2)

## 2023-07-23 LAB — PROTIME-INR
INR: 2 — ABNORMAL HIGH (ref 0.8–1.2)
Prothrombin Time: 22.8 s — ABNORMAL HIGH (ref 11.4–15.2)

## 2023-07-23 LAB — LACTATE DEHYDROGENASE: LDH: 193 U/L — ABNORMAL HIGH (ref 98–192)

## 2023-07-23 NOTE — Progress Notes (Signed)
Patient presents for 2 mo f/u visit in VAD Clinic today with his wife Erie Noe. Reports no problems with VAD equipment or concerns with drive line.  Patient states he feels great and denies lightheadedness, dizziness, or syncope. He does report one episode of "blurry vision" last night after standing that resolved with sitting. He did not drink as much yesterday due to busy schedule.     Patient reports one fall last week while taking out trash when he tripped. He fell on his left shoulder which has been hurting and reports pain is worse. Wife says xray was taken which revealed no fractures, but probable "soft tissue injury".  He is scheduled for shoulder injections.   BP elevated today, wife thinks it is pain related. Number of PI events elevated. Dr. Gasper Lloyd says next visit we will do a ramp echo and RHC if indicated.   Vital Signs:  Automatic BP:105/93 (99) Doppler Pressure: 124 HR:  93 SPO2: 99%    Weight: 190.6 lbs lb w/ eqt Last weight: 186.4 lb w/ eqt  VAD Indication: Destination Therapy for age   VAD interrogation & Equipment Management: Speed: 5500 Flow: 3.8 Power: 4.2w    PI: 5.2   Alarms: none Events: 10 - 20 PI events  Fixed speed 5500 Low speed limit: 5200   Primary Controller:  Replace back up battery in 22 months. Back up controller:  Replace back up battery in 20 months.   Annual Equipment Maintenance on UBC/PM was performed on 10/07/22.    I reviewed the LVAD parameters from today and compared the results to the patient's prior recorded data. LVAD interrogation was NEGATIVE for significant power changes, NEGATIVE for clinical alarms and STABLE for PI events/speed drops. No programming changes were made and pump is functioning within specified parameters. Pt is performing daily controller and system monitor self tests along with completing weekly and monthly maintenance for LVAD equipment.   LVAD equipment check completed and is in good working order. Back-up  equipment present.    Exit Site Care: CDI. Drive line anchor secure. Pt denies fever or chills. Wife Erie Noe placing sterile 2x2 under drive line to prevent pressure injury. Pt given 8 weekly dressing kits, 6 anchors, and 2 boxes of large tegaderms for home use.   Device: none  BP & Labs: Doppler 124 - Doppler is reflecting modified systolic    Hgb 16.1 - No S/S of bleeding. Specifically denies melena/BRBPR or nosebleeds.   LDH stable at 193 with established baseline of 170 - 208. Denies tea-colored urine. No power elevations noted on interrogation.    Plan: No medication changes Coumadin dosing per Lauren PharmD Return to VAD clinic in 2 months with 1 yr Intermacs & annual maintenance & RAMP echo. Please wear tennis shoes for your 6 minute walk. Please bring your black bag, Magazine features editor, and MPU for maintenance   Hessie Diener RN,BSN VAD Coordinator  Office: [phone]  24/7 Pager: [phone]

## 2023-07-23 NOTE — Patient Instructions (Signed)
No medication changes Coumadin dosing per Lauren PharmD Return to VAD clinic in 2 months with 1 yr Intermacs & annual maintenance & RAMP echo. Please wear tennis shoes for your 6 minute walk. Please bring your black bag, battery charger, and MPU for maintenance

## 2023-07-27 ENCOUNTER — Ambulatory Visit (HOSPITAL_COMMUNITY): Payer: Self-pay | Admitting: Pharmacist

## 2023-07-27 LAB — POCT INR: INR: 1.8 — AB (ref 2.0–3.0)

## 2023-08-03 ENCOUNTER — Ambulatory Visit (HOSPITAL_COMMUNITY): Payer: Self-pay | Admitting: Pharmacist

## 2023-08-03 DIAGNOSIS — M19012 Primary osteoarthritis, left shoulder: Secondary | ICD-10-CM | POA: Diagnosis not present

## 2023-08-03 DIAGNOSIS — Z7901 Long term (current) use of anticoagulants: Secondary | ICD-10-CM | POA: Diagnosis not present

## 2023-08-03 LAB — POCT INR: INR: 2.1 (ref 2.0–3.0)

## 2023-08-10 ENCOUNTER — Other Ambulatory Visit (HOSPITAL_COMMUNITY): Payer: Self-pay | Admitting: Cardiology

## 2023-08-10 ENCOUNTER — Ambulatory Visit (HOSPITAL_COMMUNITY): Payer: Self-pay | Admitting: Pharmacist

## 2023-08-10 ENCOUNTER — Other Ambulatory Visit (HOSPITAL_COMMUNITY): Payer: Self-pay | Admitting: *Deleted

## 2023-08-10 ENCOUNTER — Other Ambulatory Visit (HOSPITAL_COMMUNITY)
Admission: RE | Admit: 2023-08-10 | Discharge: 2023-08-10 | Disposition: A | Discharge status: Home / Self Care | Payer: Medicare PPO | Source: Ambulatory Visit | Attending: Cardiology | Admitting: Cardiology

## 2023-08-10 DIAGNOSIS — Z95811 Presence of heart assist device: Secondary | ICD-10-CM | POA: Insufficient documentation

## 2023-08-10 DIAGNOSIS — Z7901 Long term (current) use of anticoagulants: Secondary | ICD-10-CM | POA: Insufficient documentation

## 2023-08-10 LAB — PROTIME-INR
INR: 2 — ABNORMAL HIGH (ref 0.8–1.2)
Prothrombin Time: 23.1 s — ABNORMAL HIGH (ref 11.4–15.2)

## 2023-08-14 DIAGNOSIS — Z299 Encounter for prophylactic measures, unspecified: Secondary | ICD-10-CM | POA: Diagnosis not present

## 2023-08-14 DIAGNOSIS — E261 Secondary hyperaldosteronism: Secondary | ICD-10-CM | POA: Diagnosis not present

## 2023-08-14 DIAGNOSIS — G620 Drug-induced polyneuropathy: Secondary | ICD-10-CM | POA: Diagnosis not present

## 2023-08-14 DIAGNOSIS — I5022 Chronic systolic (congestive) heart failure: Secondary | ICD-10-CM | POA: Diagnosis not present

## 2023-08-14 DIAGNOSIS — I10 Essential (primary) hypertension: Secondary | ICD-10-CM | POA: Diagnosis not present

## 2023-08-14 DIAGNOSIS — E1165 Type 2 diabetes mellitus with hyperglycemia: Secondary | ICD-10-CM | POA: Diagnosis not present

## 2023-08-17 ENCOUNTER — Ambulatory Visit (HOSPITAL_COMMUNITY): Payer: Self-pay | Admitting: Pharmacist

## 2023-08-17 LAB — POCT INR: INR: 2 (ref 2.0–3.0)

## 2023-08-24 ENCOUNTER — Ambulatory Visit (HOSPITAL_COMMUNITY): Payer: Self-pay | Admitting: Pharmacist

## 2023-08-24 LAB — POCT INR: INR: 1.9 — AB (ref 2.0–3.0)

## 2023-08-27 ENCOUNTER — Telehealth: Payer: Self-pay | Admitting: *Deleted

## 2023-08-27 DIAGNOSIS — R911 Solitary pulmonary nodule: Secondary | ICD-10-CM

## 2023-08-27 NOTE — Telephone Encounter (Signed)
Spoke with the pt and reminded him that it's time to repeat chest ct to f/u pulmonary nodule  He was agreeable to this  I have placed order for this

## 2023-08-27 NOTE — Telephone Encounter (Signed)
-----   Message from Sandrea Hughs sent at 09/09/2022  3:39 PM EST ----- Ct s contrast by end of month of Jan 2025 f/u spn

## 2023-08-31 ENCOUNTER — Ambulatory Visit (HOSPITAL_COMMUNITY): Payer: Self-pay | Admitting: Pharmacist

## 2023-08-31 LAB — POCT INR: INR: 2.1 (ref 2.0–3.0)

## 2023-09-01 ENCOUNTER — Other Ambulatory Visit (HOSPITAL_COMMUNITY): Payer: Self-pay | Admitting: *Deleted

## 2023-09-01 DIAGNOSIS — I5022 Chronic systolic (congestive) heart failure: Secondary | ICD-10-CM

## 2023-09-01 DIAGNOSIS — Z95811 Presence of heart assist device: Secondary | ICD-10-CM

## 2023-09-01 MED ORDER — AMOXICILLIN 500 MG PO CAPS: ORAL_CAPSULE | ORAL | 3 refills | Status: DC

## 2023-09-03 ENCOUNTER — Other Ambulatory Visit (HOSPITAL_COMMUNITY): Payer: Medicare PPO

## 2023-09-07 ENCOUNTER — Ambulatory Visit (HOSPITAL_COMMUNITY): Payer: Self-pay | Admitting: Pharmacist

## 2023-09-07 DIAGNOSIS — Z7901 Long term (current) use of anticoagulants: Secondary | ICD-10-CM | POA: Diagnosis not present

## 2023-09-07 LAB — POCT INR: INR: 2 (ref 2.0–3.0)

## 2023-09-10 ENCOUNTER — Ambulatory Visit (HOSPITAL_COMMUNITY)
Admission: RE | Admit: 2023-09-10 | Discharge: 2023-09-10 | Disposition: A | Discharge status: Home / Self Care | Payer: Medicare PPO | Source: Ambulatory Visit | Attending: Internal Medicine | Admitting: Internal Medicine

## 2023-09-10 DIAGNOSIS — R918 Other nonspecific abnormal finding of lung field: Secondary | ICD-10-CM | POA: Diagnosis not present

## 2023-09-10 DIAGNOSIS — I700 Atherosclerosis of aorta: Secondary | ICD-10-CM | POA: Diagnosis not present

## 2023-09-10 DIAGNOSIS — R911 Solitary pulmonary nodule: Secondary | ICD-10-CM | POA: Insufficient documentation

## 2023-09-14 ENCOUNTER — Ambulatory Visit (HOSPITAL_COMMUNITY): Payer: Self-pay | Admitting: Pharmacist

## 2023-09-14 LAB — POCT INR: INR: 1.9 — AB (ref 2.0–3.0)

## 2023-09-21 ENCOUNTER — Ambulatory Visit (HOSPITAL_COMMUNITY): Payer: Self-pay | Admitting: Pharmacist

## 2023-09-21 LAB — POCT INR: INR: 2.1 (ref 2.0–3.0)

## 2023-09-25 ENCOUNTER — Other Ambulatory Visit (HOSPITAL_COMMUNITY): Payer: Self-pay | Admitting: Cardiology

## 2023-09-28 ENCOUNTER — Other Ambulatory Visit (HOSPITAL_COMMUNITY): Payer: Self-pay

## 2023-09-28 ENCOUNTER — Ambulatory Visit (HOSPITAL_COMMUNITY): Payer: Self-pay | Admitting: Pharmacist

## 2023-09-28 DIAGNOSIS — Z7901 Long term (current) use of anticoagulants: Secondary | ICD-10-CM

## 2023-09-28 DIAGNOSIS — Z95811 Presence of heart assist device: Secondary | ICD-10-CM

## 2023-09-28 LAB — POCT INR: INR: 2.3 (ref 2.0–3.0)

## 2023-09-28 NOTE — Progress Notes (Signed)
 ADVANCED HEART FAILURE CLINIC NOTE  Referring Physician: Ignatius Specking, MD  Primary Care: Ignatius Specking, MD Primary Cardiologist: Patric Dykes Heart Failure: Dorthula Nettles  CC: Stage D / End-stage heart failure s/p HMIII LVAD  HPI: Ricardo Zuniga is a very pleasant 73 YO WM w/ HFrEF 2/2 nonischemic cardiomyopathy, T2DM and hx of acute promyelocytic leukemia s/p ATRA ([facility] 2014) that was initially see at [facility] in October 2023 after presenting with a 1 week history of orthopnea, PND & LE edema. He had a TTE prior to admission w/ LVEF of 40-45%. Echo during admit was notable for LVEF of 30%-35%. LHC/RHC during admit w/ nonobstructive CAD & moderately reduced CI (2.1 L/min/m2). His cardiac history dates back to 2015 when he was found to have an LVEF of 25% felt to be triggered by endocarditis. He had improvement in LV function for several years, however, unfortunately had a fairly rapid decline in LVEF and functional status over the winter months of 2023. The decision was made to proceed with HMIII implantation. [name] underwent successful implant on 09/24/22 as DT. His post-operative course was fairly unremarkable. He had rapid improvement in PA pressures after HMIII implantation.   Interval hx: - No issues since his last visit.  - RAMP study with stable RV function; at 5700 RPM he had slight bowing of the IVS to the left.    Current Outpatient Medications  Medication Sig Dispense Refill   acetaminophen (TYLENOL) 650 MG CR tablet Take 650-1,300 mg by mouth every 8 (eight) hours as needed for pain.     atorvastatin (LIPITOR) 40 MG tablet Take 1 tablet by mouth once daily 90 tablet 0   Cholecalciferol (VITAMIN D3) 125 MCG (5000 UT) TABS Take 5,000 Units by mouth in the morning.     Digoxin 62.5 MCG TABS Take 1 tablet by mouth daily. (Patient taking differently: Take 62.5 mcg by mouth every evening.) 90 tablet 3   docusate sodium (COLACE) 100 MG capsule Take 100 mg by  mouth every evening.     empagliflozin (JARDIANCE) 10 MG TABS tablet Take 10 mg by mouth every evening.     repaglinide (PRANDIN) 2 MG tablet Take 2-4 mg by mouth See admin instructions. Take 2 tablets (4 mg) by mouth in the morning & take 1 tablet (2 mg) by mouth in the evening.     traZODone (DESYREL) 100 MG tablet TAKE 1 TABLET BY MOUTH AT BEDTIME AS NEEDED FOR  SLEEP (Patient taking differently: Take 100 mg by mouth at bedtime.) 90 tablet 3   warfarin (COUMADIN) 5 MG tablet Take 5 mg (1 tab) daily or as directed by HF Clinic (Patient taking differently: Take 5 mg by mouth every evening.) 90 tablet 3   zinc sulfate, 50mg  elemental zinc, 220 (50 Zn) MG capsule Take 220 mg by mouth daily.     cefadroxil (DURICEF) 500 MG capsule Take 1 capsule (500 mg total) by mouth 2 (two) times daily for 14 days. 28 capsule 0   furosemide (LASIX) 20 MG tablet Take 1 tablet (20 mg total) by mouth as needed.     omeprazole (PRILOSEC OTC) 20 MG tablet Take 20 mg by mouth daily.     sodium fluoride (DENTA 5000 PLUS) 1.1 % CREA dental cream Place 1 Application onto teeth every evening.     No current facility-administered medications for this encounter.    PHYSICAL EXAM:  Vital Signs:  Automatic BP: 99/80 (88) Doppler Pressure: 96 HR:  79 SPO2: 98%    Weight: 194 lbs lb w/ eqt Last weight: 190.6 lb w/ eqt   VAD Indication: Destination Therapy for age   VAD interrogation & Equipment Management: Speed: 5500 Flow: 3.7 Power: 4.3w  PI: 5.9   Alarms: none Events: 10-15 PI events  Fixed speed 5500 Low speed limit: 5200  Vitals:   09/29/23 1359 09/29/23 1406  BP: 99/80 (!) 96/0  Pulse: 79   SpO2: 98%    GENERAL: NAD Lungs- CTA  CARDIAC:  JVP: 6 cm          Normal LVAD hum ABDOMEN: Soft, non-tender, non-distended.  EXTREMITIES: Warm and well perfused.  NEUROLOGIC: No obvious FND   ECHO: - Echo (06/08/22): EF 30-35%, global HK, RV mildly reduced, mild MR   - Echo (7/23): EF 45%, RV  normal   - Echo (8/22): EF 40-45%, RV normal   - Echo (7/21): EF 50%, RV normal  - LHC (09/2018): Moderate nonobstructive one-vessel coronary artery disease, prox RCA 40% stenosed.   - Echo (08/2018): EF 40%, RV normal  - Echo (05/2016): EF 35-40%, RV normal   - Echo (05/2014): EF 25-30%, RV mildly reduced  - Echo (05/2012): EF 50%, RV normal  - Echo (1/24): EF 25%, severe LV dilation, global hypokinesis, moderate RV dysfunction, mild RV enlargement, moderate AI, mild-moderate MR. - Echo (11/27/22): HMIII LVAD in place. Ramp study from 5500RPM-5800RPM; trivial MR throughout. Septum midline. RV enlarged but with preserved function.   CATH: R/LHC (10/23): nonobstructive CAD RA 8 PA 52/25 (34 mean) PCWP 14 (mean) CO/CI (Fick): 4.3/2.1                                 PVR 4.6 WU          PAPi 3.4             RHC (1/24): RA mean 10 PA 69/28, mean 43 PCWP mean 20 CI 1.85 PVR 6.22 WU PAPi 4.1     ASSESSMENT & PLAN:  1. Chronic systolic heart failure s/p HMIII Destination therapy - Significant improvement in functional status since HMIII placement. Doing very well functionally.  - Continue digoxin 62.5 mcg daily. Digoxin level 0.3 two weeks ago; will repeat at follow up.  - Continue jardiance 10mg  daily.  - Euvolemic on exam; continue lasix 20mg  PRN; has not required lasix dosing for several weeks.  - Remains hypertensive today; will need to start low dose anti-HTN if BP remains elevated. Discussed with him today.  - INR 2.0; discussed with pharmD & LVAD coordinator - LDH stable at 212; previously 193.  - RAMP echo as noted above; at 5700 RPM patient had bowing of the septum to the left. Will maintain speeds at 5500 RPM. Driveline site looks good.   2. Hx of atrial tach, PVCs & NSVT -Resolved on Zio patch.  Amiodarone discontinued -Telemetry stable today.   3. Hx of promyelocytic leukemia: S/P ATRA in 2014 at [facility].   4. Hx of endocarditis: 2015  5. Hypertension - MAPS well  controlled; continue to monitor at home.   I reviewed the LVAD parameters from today and compared the results to the patient's prior recorded data. LVAD interrogation was NEGATIVE for significant power changes, NEGATIVE for clinical alarms and STABLE for PI events/speed drops. No programming changes were made and pump is functioning within specified parameters. Pt is performing daily controller and system monitor self tests along with  completing weekly and monthly maintenance for LVAD equipment.   I spent 55 minutes caring for this patient today including face to face time, ordering and reviewing labs, reviewing RAMP echocardiogram with him, seeing the patient, documenting in the record, and arranging follow ups.    Dorthula Nettles 10/17/2023 6:57 PM

## 2023-09-29 ENCOUNTER — Ambulatory Visit (HOSPITAL_BASED_OUTPATIENT_CLINIC_OR_DEPARTMENT_OTHER)
Admission: RE | Admit: 2023-09-29 | Discharge: 2023-09-29 | Disposition: A | Discharge status: Home / Self Care | Payer: Medicare PPO | Source: Ambulatory Visit | Attending: Cardiology | Admitting: Cardiology

## 2023-09-29 ENCOUNTER — Ambulatory Visit (HOSPITAL_COMMUNITY)
Admission: RE | Admit: 2023-09-29 | Discharge: 2023-09-29 | Disposition: A | Discharge status: Home / Self Care | Payer: Medicare PPO | Source: Ambulatory Visit | Attending: Cardiology | Admitting: Cardiology

## 2023-09-29 VITALS — BP 96/0 | HR 79 | Ht 69.0 in | Wt 194.0 lb

## 2023-09-29 DIAGNOSIS — I110 Hypertensive heart disease with heart failure: Secondary | ICD-10-CM | POA: Insufficient documentation

## 2023-09-29 DIAGNOSIS — I493 Ventricular premature depolarization: Secondary | ICD-10-CM | POA: Diagnosis not present

## 2023-09-29 DIAGNOSIS — Z79899 Other long term (current) drug therapy: Secondary | ICD-10-CM | POA: Diagnosis not present

## 2023-09-29 DIAGNOSIS — I5022 Chronic systolic (congestive) heart failure: Secondary | ICD-10-CM

## 2023-09-29 DIAGNOSIS — E785 Hyperlipidemia, unspecified: Secondary | ICD-10-CM | POA: Diagnosis not present

## 2023-09-29 DIAGNOSIS — Z95811 Presence of heart assist device: Secondary | ICD-10-CM

## 2023-09-29 DIAGNOSIS — I428 Other cardiomyopathies: Secondary | ICD-10-CM

## 2023-09-29 DIAGNOSIS — E119 Type 2 diabetes mellitus without complications: Secondary | ICD-10-CM | POA: Diagnosis not present

## 2023-09-29 DIAGNOSIS — Z5181 Encounter for therapeutic drug level monitoring: Secondary | ICD-10-CM | POA: Diagnosis not present

## 2023-09-29 DIAGNOSIS — Z7901 Long term (current) use of anticoagulants: Secondary | ICD-10-CM | POA: Diagnosis not present

## 2023-09-29 LAB — COMPREHENSIVE METABOLIC PANEL
ALT: 36 U/L (ref 0–44)
AST: 28 U/L (ref 15–41)
Albumin: 4.3 g/dL (ref 3.5–5.0)
Alkaline Phosphatase: 58 U/L (ref 38–126)
Anion gap: 13 (ref 5–15)
BUN: 14 mg/dL (ref 8–23)
CO2: 25 mmol/L (ref 22–32)
Calcium: 9.7 mg/dL (ref 8.9–10.3)
Chloride: 99 mmol/L (ref 98–111)
Creatinine, Ser: 0.91 mg/dL (ref 0.61–1.24)
GFR, Estimated: >60 mL/min (ref >60)
Glucose, Bld: 139 mg/dL — ABNORMAL HIGH (ref 70–99)
Potassium: 4.4 mmol/L (ref 3.5–5.1)
Sodium: 137 mmol/L (ref 135–145)
Total Bilirubin: 1 mg/dL (ref 0.0–1.2)
Total Protein: 7.7 g/dL (ref 6.5–8.1)

## 2023-09-29 LAB — CBC
HCT: 48.8 % (ref 39.0–52.0)
Hemoglobin: 17 g/dL (ref 13.0–17.0)
MCH: 30.6 pg (ref 26.0–34.0)
MCHC: 34.8 g/dL (ref 30.0–36.0)
MCV: 87.8 fL (ref 80.0–100.0)
Platelets: 193 10*3/uL (ref 150–400)
RBC: 5.56 MIL/uL (ref 4.22–5.81)
RDW: 14.4 % (ref 11.5–15.5)
WBC: 7.3 10*3/uL (ref 4.0–10.5)
nRBC: 0 % (ref 0.0–0.2)

## 2023-09-29 LAB — PROTIME-INR
INR: 2.2 — ABNORMAL HIGH (ref 0.8–1.2)
Prothrombin Time: 24.8 s — ABNORMAL HIGH (ref 11.4–15.2)

## 2023-09-29 LAB — DIGOXIN LEVEL: Digoxin Level: 0.3 ng/mL — ABNORMAL LOW (ref 0.8–2.0)

## 2023-09-29 LAB — LACTATE DEHYDROGENASE: LDH: 212 U/L — ABNORMAL HIGH (ref 98–192)

## 2023-09-29 LAB — MAGNESIUM: Magnesium: 2.1 mg/dL (ref 1.7–2.4)

## 2023-09-29 LAB — PREALBUMIN: Prealbumin: 25 mg/dL (ref 18–38)

## 2023-09-29 LAB — ECHOCARDIOGRAM LIMITED: Est EF: <20

## 2023-09-29 NOTE — Progress Notes (Signed)
Patient presents for 2 mo f/u visit with annual maintenance and 1 year intermacs in VAD Clinic today  with his wife Erie Noe. Reports no problems with VAD equipment or concerns with drive line.  Patient states he feels great and denies lightheadedness, dizziness, or syncope or signs of bleeding.    He is going to CR 3x a week for the maintenance program.  Vital Signs:  Automatic BP: 99/80 (88) Doppler Pressure: 96 HR:  79 SPO2: 98%    Weight: 194 lbs lb w/ eqt Last weight: 190.6 lb w/ eqt  VAD Indication: Destination Therapy for age   VAD interrogation & Equipment Management: Speed: 5500 Flow: 3.7 Power: 4.3w    PI: 5.9   Alarms: none Events: 10 - 15 PI events  Fixed speed 5500 Low speed limit: 5200   Primary Controller:  Replace back up battery in 20 months. Back up controller:  Replace back up battery in 20 months.   Annual Equipment Maintenance on UBC/PM was performed on 09/29/23.    I reviewed the LVAD parameters from today and compared the results to the patient's prior recorded data. LVAD interrogation was NEGATIVE for significant power changes, NEGATIVE for clinical alarms and STABLE for PI events/speed drops. No programming changes were made and pump is functioning within specified parameters. Pt is performing daily controller and system monitor self tests along with completing weekly and monthly maintenance for LVAD equipment.   LVAD equipment check completed and is in good working order. Back-up equipment present.    Exit Site Care: CDI. Drive line anchor secure. Pt denies fever or chills. Wife Erie Noe placing sterile 2x2 under drive line to prevent pressure injury. Pt given 8 weekly dressing kits, 6 anchors, and 2 boxes of large tegaderms for home use.   Device: none  BP & Labs: Doppler 96 - Doppler is reflecting modified systolic    Hgb 17 - No S/S of bleeding. Specifically denies melena/BRBPR or nosebleeds.   LDH stable at 212 with established baseline of  170 - 208. Denies tea-colored urine. No power elevations noted on interrogation.    Batteries Manufacture Date: Number of uses: Re-calibration  2.1.24 48 Performed by patient  2.1.24 77 Performed by patient   Annual maintenance completed per [employer] on patient's home power module and Warehouse manager.    Backup system controller 11 volt battery charged during visit.   1 year Intermacs follow up completed including:  Quality of Life, KCCQ-12, and Neurocognitive trail making.   Pt completed 1400 feet during 6 minute walk.  [place] Cardiomyopathy Questionnaire     09/29/2023    2:13 PM 05/25/2023   11:37 AM 12/25/2022    3:08 PM  KCCQ-12  1 a. Ability to shower/bathe Other, Did not do Not at all limited Slightly limited  1 b. Ability to walk 1 block Not at all limited Not at all limited Not at all limited  1 c. Ability to hurry/jog Slightly limited Not at all limited Other, Did not do  2. Edema feet/ankles/legs Never over the past 2 weeks Never over the past 2 weeks Less than once a week  3. Limited by fatigue Never over the past 2 weeks Never over the past 2 weeks Never over the past 2 weeks  4. Limited by dyspnea Never over the past 2 weeks Never over the past 2 weeks Never over the past 2 weeks  5. Sitting up / on 3+ pillows Never over the past 2 weeks Never over the past  2 weeks Never over the past 2 weeks  6. Limited enjoyment of life Not limited at all Not limited at all Not limited at all  7. Rest of life w/ symptoms Completely satisfied Completely satisfied Completely satisfied  8 a. Participation in hobbies Slightly limited Slightly limited Slightly limited  8 b. Participation in chores Did not limit at all Did not limit at all Did not limit at all  8 c. Visiting family/friends Did not limit at all Did not limit at all Did not limit at all      Patient Goals: Continue CR   Plan: No medication changes Coumadin dosing per Lauren PharmD Return to VAD clinic in  2 months   Carlton Adam RN,BSN VAD Coordinator  Office: [phone]  24/7 Pager: [phone]

## 2023-09-29 NOTE — Progress Notes (Signed)
Speed  Flow  PI  Power  LVIDD  AI  Aortic opening MR  TR  Septum  RV  VTI (>18cm)  5500 3.7 5.9 4.3 5.8 none 0/5  trivial  mild midline mild     5600 3.9 4.3 4.3 5.64 none 5/5 trivial mild Midline slightly bow to left mild    5700 3.9 4.8 4.5 5.6 none 5/5 mild trivial Slight bow to left mild                                              Doppler MAP:  Auto cuff BP: 99/80 (88)   Ramp ECHO performed at bedside per Dr Gasper Lloyd  At completion of ramp study, patients primary controller programmed:  Fixed speed: 5600 Low speed limit: 5300    Carlton Adam RN, VAD Coordinator 24/7 pager [phone]

## 2023-09-29 NOTE — Patient Instructions (Signed)
 No change in medications  Return to clinic in 2 months

## 2023-09-30 ENCOUNTER — Encounter: Payer: Self-pay | Admitting: Neurology

## 2023-10-04 ENCOUNTER — Encounter: Payer: Self-pay | Admitting: *Deleted

## 2023-10-04 NOTE — Progress Notes (Signed)
 Recieved page from pt's wife reporting when they changed pt's drive line dressing last night he had moderate of thin yellow drainage. Denies redness, tenderness, rash, fever or chills. Denies trauma. Advised to change Sorbaview dressing to a gauze dressing. Will see pt in clinic tomorrow morning at 9 AM for wound check. Erie Noe verbalized understanding.   Alyce Pagan RN, BSN VAD Coordinator  Office: [phone]  Pager: [phone]

## 2023-10-05 ENCOUNTER — Ambulatory Visit (HOSPITAL_COMMUNITY): Payer: Self-pay | Admitting: Pharmacist

## 2023-10-05 ENCOUNTER — Ambulatory Visit (HOSPITAL_COMMUNITY)
Admission: RE | Admit: 2023-10-05 | Discharge: 2023-10-05 | Disposition: A | Discharge status: Home / Self Care | Payer: Medicare PPO | Source: Ambulatory Visit | Attending: Cardiology

## 2023-10-05 DIAGNOSIS — Z7901 Long term (current) use of anticoagulants: Secondary | ICD-10-CM | POA: Diagnosis not present

## 2023-10-05 DIAGNOSIS — Z452 Encounter for adjustment and management of vascular access device: Secondary | ICD-10-CM | POA: Insufficient documentation

## 2023-10-05 DIAGNOSIS — T827XXA Infection and inflammatory reaction due to other cardiac and vascular devices, implants and grafts, initial encounter: Secondary | ICD-10-CM | POA: Insufficient documentation

## 2023-10-05 DIAGNOSIS — Z95811 Presence of heart assist device: Secondary | ICD-10-CM

## 2023-10-05 LAB — POCT INR: INR: 2.3 (ref 2.0–3.0)

## 2023-10-05 MED ORDER — CEFADROXIL 500 MG PO CAPS: 500.0000 mg | ORAL_CAPSULE | Freq: Two times a day (BID) | ORAL | 0 refills | Status: AC

## 2023-10-05 NOTE — Patient Instructions (Addendum)
 Start Cefadroxil 1000 mg (2 tablets) twice daily for 14 days Coumadin dosing per Lauren PharmD Return to VAD clinic in 1 week for wound check

## 2023-10-05 NOTE — Progress Notes (Signed)
 Pt presents to VAD clinic this morning for wound check with his wife. Erie Noe paged over the weekend reporting new onset thin yellow drainage for exit site. Advised to come to clinic for evaluation.   Denies fever, chills, or known trauma to exit site. Reports he at times is a "wild sleeper". Advised to wear abdominal binder when sleeping to prevent trauma. He verbalized understanding. Pt is showering once a week and changing dressing after showering. Discussed cleaning shower head with 1:1 bleach solution, letting it sit on shower head for 10 minutes, then rinsing it off prior to showering. They verbalized understanding.   Exit site care: Existing VAD dressing removed and site care performed using sterile technique. Drive line exit site cleaned with Chlora prep applicators x 2, allowed to dry, then rinsed with sterile saline wipe. Gauze dressing without silver strip applied. Small gauze placed under exit site beneath drive line to prevent pressure injury. Exit site healed and partially incorporated, the velour is fully implanted at exit site. Slight opening of skin under drive line noted. Unable to culture today as opening is too small. Moderate amount of thin yellow drainage noted on previous dressing. Unable to express drainage from site. Small area of induration with tenderness with palpation noted above exit site near ribs, but does not track along drive line. No redness, foul odor or rash noted. Cath grip anchor applied. Pt denies fever or chills.   Instructed to change dressing daily using daily kit. Provided with 14 daily kits, 7 foley anchors, and 10 cath grip anchors for home use. If they note skin irritation with cath grip anchor may go back to foley anchor.        Discussed the above with Dr Gasper Lloyd via telephone- order received for Cefadroxil 1000 mg BID x 14 days. Will bring pt back to clinic in 1 week for wound check. Prescription sent to [facility] pharmacy as requested.   Advised pt  and wife to notify VAD coordinators immediately for redness, increased drainage, increased tenderness, increased indurated area, or develops fever/chills. They verbalized understanding.   Plan:  Start Cefadroxil 1000 mg (2 tablets) twice daily for 14 days Coumadin dosing per Lauren PharmD Return to VAD clinic in 1 week for wound check  Alyce Pagan RN VAD Coordinator  Office: [phone]  24/7 Pager: [phone]

## 2023-10-11 ENCOUNTER — Telehealth (HOSPITAL_COMMUNITY): Payer: Self-pay

## 2023-10-11 ENCOUNTER — Emergency Department (HOSPITAL_COMMUNITY)
Admission: EM | Admit: 2023-10-11 | Discharge: 2023-10-11 | Disposition: A | Discharge status: Home / Self Care | Attending: Emergency Medicine | Admitting: Emergency Medicine

## 2023-10-11 ENCOUNTER — Other Ambulatory Visit: Payer: Self-pay

## 2023-10-11 ENCOUNTER — Emergency Department (HOSPITAL_COMMUNITY)

## 2023-10-11 DIAGNOSIS — R0789 Other chest pain: Secondary | ICD-10-CM | POA: Diagnosis not present

## 2023-10-11 DIAGNOSIS — G43109 Migraine with aura, not intractable, without status migrainosus: Secondary | ICD-10-CM | POA: Diagnosis not present

## 2023-10-11 DIAGNOSIS — R519 Headache, unspecified: Secondary | ICD-10-CM | POA: Diagnosis not present

## 2023-10-11 DIAGNOSIS — Z7901 Long term (current) use of anticoagulants: Secondary | ICD-10-CM | POA: Diagnosis not present

## 2023-10-11 DIAGNOSIS — R29818 Other symptoms and signs involving the nervous system: Secondary | ICD-10-CM | POA: Diagnosis not present

## 2023-10-11 DIAGNOSIS — R202 Paresthesia of skin: Secondary | ICD-10-CM | POA: Diagnosis not present

## 2023-10-11 DIAGNOSIS — G43909 Migraine, unspecified, not intractable, without status migrainosus: Secondary | ICD-10-CM | POA: Diagnosis not present

## 2023-10-11 LAB — CBC WITH DIFFERENTIAL/PLATELET
Abs Immature Granulocytes: 0.02 10*3/uL (ref 0.00–0.07)
Basophils Absolute: 0 10*3/uL (ref 0.0–0.1)
Basophils Relative: 0 %
Eosinophils Absolute: 0.1 10*3/uL (ref 0.0–0.5)
Eosinophils Relative: 1 %
HCT: 44.9 % (ref 39.0–52.0)
Hemoglobin: 15.7 g/dL (ref 13.0–17.0)
Immature Granulocytes: 0 %
Lymphocytes Relative: 19 %
Lymphs Abs: 1.3 10*3/uL (ref 0.7–4.0)
MCH: 30 pg (ref 26.0–34.0)
MCHC: 35 g/dL (ref 30.0–36.0)
MCV: 85.7 fL (ref 80.0–100.0)
Monocytes Absolute: 0.5 10*3/uL (ref 0.1–1.0)
Monocytes Relative: 7 %
Neutro Abs: 5 10*3/uL (ref 1.7–7.7)
Neutrophils Relative %: 73 %
Platelets: 233 10*3/uL (ref 150–400)
RBC: 5.24 MIL/uL (ref 4.22–5.81)
RDW: 13.8 % (ref 11.5–15.5)
WBC: 6.9 10*3/uL (ref 4.0–10.5)
nRBC: 0 % (ref 0.0–0.2)

## 2023-10-11 LAB — COMPREHENSIVE METABOLIC PANEL
ALT: 26 U/L (ref 0–44)
AST: 22 U/L (ref 15–41)
Albumin: 4 g/dL (ref 3.5–5.0)
Alkaline Phosphatase: 59 U/L (ref 38–126)
Anion gap: 15 (ref 5–15)
BUN: 23 mg/dL (ref 8–23)
CO2: 18 mmol/L — ABNORMAL LOW (ref 22–32)
Calcium: 9.5 mg/dL (ref 8.9–10.3)
Chloride: 102 mmol/L (ref 98–111)
Creatinine, Ser: 0.88 mg/dL (ref 0.61–1.24)
GFR, Estimated: >60 mL/min (ref >60)
Glucose, Bld: 269 mg/dL — ABNORMAL HIGH (ref 70–99)
Potassium: 4.3 mmol/L (ref 3.5–5.1)
Sodium: 135 mmol/L (ref 135–145)
Total Bilirubin: 0.8 mg/dL (ref 0.0–1.2)
Total Protein: 7.6 g/dL (ref 6.5–8.1)

## 2023-10-11 LAB — PROTIME-INR
INR: 2.1 — ABNORMAL HIGH (ref 0.8–1.2)
Prothrombin Time: 23.5 s — ABNORMAL HIGH (ref 11.4–15.2)

## 2023-10-11 MED ORDER — ACETAMINOPHEN 325 MG PO TABS
650.0000 mg | ORAL_TABLET | Freq: Once | ORAL | Status: AC
Start: 2023-10-11 — End: 2023-10-11
  Administered 2023-10-11: 650 mg via ORAL
  Filled 2023-10-11: qty 2

## 2023-10-11 MED ORDER — ACETAMINOPHEN 325 MG PO TABS
650.0000 mg | ORAL_TABLET | Freq: Once | ORAL | Status: AC
  Administered 2023-10-11: 650 mg via ORAL
  Filled 2023-10-11: qty 2

## 2023-10-11 MED ORDER — TRAMADOL HCL 50 MG PO TABS: 50.0000 mg | ORAL_TABLET | Freq: Once | ORAL | Status: DC

## 2023-10-11 NOTE — ED Notes (Signed)
 MAP assessed using doppler and regular adult cuff. 

## 2023-10-11 NOTE — Telephone Encounter (Signed)
 Pt's wife paged VAD Coordinator at 1058 stating on their way to church to began to complain of shortness of breath,headache and numbness/tingling all over his body. Pt advised to seek emergency care at [facility] as he is 45 minutes from [facility]. VAD Coordinator notified Jeani Hawking charge nurse of pt's pending arrival and gave her the VAD emergency paged to contact our team. Discussed with Dr.Stoner.  Simmie Davies RN, BSN VAD Coordinator 24/7 Pager [phone]

## 2023-10-11 NOTE — Discharge Instructions (Signed)
Follow-up with your family doctor this week for recheck 

## 2023-10-11 NOTE — ED Provider Notes (Signed)
 [place] EMERGENCY DEPARTMENT AT [facility] Provider Note   CSN: [id number] Arrival date & time: 10/11/23  1116     History {Add pertinent medical, surgical, social history, OB history to HPI:1} Chief Complaint  Patient presents with   Numbness    Ricardo Zuniga is a 73 y.o. male.  Patient has a history of congestive heart failure and has an LVAD.  He was in church and started feeling bad and then had numbness in his legs arms and bilateral face.  He then started to have headache   Headache      Home Medications Prior to Admission medications   Medication Sig Start Date End Date Taking? Authorizing Provider  acetaminophen (TYLENOL) 650 MG CR tablet Take 650-1,300 mg by mouth every 8 (eight) hours as needed for pain.   Yes [provider]  atorvastatin (LIPITOR) 40 MG tablet Take 1 tablet by mouth once daily 09/25/23  Yes Sabharwal, Aditya, DO  cefadroxil (DURICEF) 500 MG capsule Take 1 capsule (500 mg total) by mouth 2 (two) times daily for 14 days. 10/05/23 10/19/23 Yes Sabharwal, Aditya, DO  Cholecalciferol (VITAMIN D3) 125 MCG (5000 UT) TABS Take 5,000 Units by mouth in the morning.   Yes [provider]  Digoxin 62.5 MCG TABS Take 1 tablet by mouth daily. Patient taking differently: Take 62.5 mcg by mouth every evening. 04/27/23  Yes Sabharwal, Aditya, DO  docusate sodium (COLACE) 100 MG capsule Take 100 mg by mouth every evening.   Yes [provider]  empagliflozin (JARDIANCE) 10 MG TABS tablet Take 10 mg by mouth every evening.   Yes [provider]  furosemide (LASIX) 20 MG tablet Take 1 tablet (20 mg total) by mouth as needed. 05/25/23  Yes Sabharwal, Aditya, DO  omeprazole (PRILOSEC OTC) 20 MG tablet Take 20 mg by mouth daily.   Yes [provider]  repaglinide (PRANDIN) 2 MG tablet Take 2-4 mg by mouth See admin instructions. Take 2 tablets (4 mg) by mouth in the morning & take 1 tablet (2 mg) by mouth in the  evening. 07/06/17  Yes [provider]  sodium fluoride (DENTA 5000 PLUS) 1.1 % CREA dental cream Place 1 Application onto teeth every evening.   Yes [provider]  traZODone (DESYREL) 100 MG tablet TAKE 1 TABLET BY MOUTH AT BEDTIME AS NEEDED FOR  SLEEP Patient taking differently: Take 100 mg by mouth at bedtime. 08/10/23  Yes Sabharwal, Aditya, DO  warfarin (COUMADIN) 5 MG tablet Take 5 mg (1 tab) daily or as directed by HF Clinic Patient taking differently: Take 5 mg by mouth every evening. 04/27/23  Yes Sabharwal, Aditya, DO  zinc sulfate, 50mg  elemental zinc, 220 (50 Zn) MG capsule Take 220 mg by mouth daily.   Yes [provider]      Allergies    Zestril [lisinopril]    Review of Systems   Review of Systems  Neurological:  Positive for headaches.    Physical Exam Updated Vital Signs BP (!) 133/98   Pulse 87   Temp (!) 97.5 F (36.4 C) (Oral)   Resp 17   SpO2 93%  Physical Exam  ED Results / Procedures / Treatments   Labs (all labs ordered are listed, but only abnormal results are displayed) Labs Reviewed  COMPREHENSIVE METABOLIC PANEL - Abnormal; Notable for the following components:      Result Value   CO2 18 (*)    Glucose, Bld 269 (*)  All other components within normal limits  PROTIME-INR - Abnormal; Notable for the following components:   Prothrombin Time 23.5 (*)    INR 2.1 (*)    All other components within normal limits  CBC WITH DIFFERENTIAL/PLATELET    EKG None  Radiology CT Head Wo Contrast Result Date: 10/11/2023 CLINICAL DATA:  Headache with neuro deficit. EXAM: CT HEAD WITHOUT CONTRAST TECHNIQUE: Contiguous axial images were obtained from the base of the skull through the vertex without intravenous contrast. RADIATION DOSE REDUCTION: This exam was performed according to the departmental dose-optimization program which includes automated exposure control, adjustment of the mA and/or kV according to patient size and/or use  of iterative reconstruction technique. COMPARISON:  10/03/22 FINDINGS: Brain: No evidence of acute infarction, hemorrhage, hydrocephalus, extra-axial collection or mass lesion/mass effect. Chronic infarct in the right cerebellum which is moderate size. Vascular: No hyperdense vessel or unexpected calcification. Skull: Normal. Negative for fracture or focal lesion. Sinuses/Orbits: No acute finding. IMPRESSION: No acute finding or change from prior. No specific cause for headache. Electronically Signed   By: Tiburcio Pea M.D.   On: 10/11/2023 12:30    Procedures Procedures  {Document cardiac monitor, telemetry assessment procedure when appropriate:1}  Medications Ordered in ED Medications  traMADol (ULTRAM) tablet 50 mg (0 mg Oral Hold 10/11/23 1345)  acetaminophen (TYLENOL) tablet 650 mg (650 mg Oral Given 10/11/23 1201)  acetaminophen (TYLENOL) tablet 650 mg (650 mg Oral Given 10/11/23 1314)    ED Course/ Medical Decision Making/ A&P   {I spoke with Dr. Selina Cooley about this patient and she felt like there was no need to do any more imaging Click here for ABCD2, HEART and other calculatorsREFRESH Note before signing :1}                              Medical Decision Making Amount and/or Complexity of Data Reviewed Labs: ordered. Radiology: ordered.  Risk OTC drugs. Prescription drug management.   Patient with paresthesias and headache.  He has improved and will be discharged home  {Document critical care time when appropriate:1} {Document review of labs and clinical decision tools ie heart score, Chads2Vasc2 etc:1}  {Document your independent review of radiology images, and any outside records:1} {Document your discussion with family members, caretakers, and with consultants:1} {Document social determinants of health affecting pt's care:1} {Document your decision making why or why not admission, treatments were needed:1} Final Clinical Impression(s) / ED Diagnoses Final diagnoses:   Paresthesia  Migraine equivalent    Rx / DC Orders ED Discharge Orders     None

## 2023-10-11 NOTE — ED Notes (Signed)
 Patient Alert and oriented to baseline. Stable and ambulatory to baseline. Patient verbalized understanding of the discharge instructions.  Patient belongings were taken by the patient.

## 2023-10-11 NOTE — ED Triage Notes (Signed)
 Pt complaining of facial numbness and tingling all over starting within 15 mins PTA, also complains of headache. Endorses Mild CP, weakness, SOB. Pt has LVAD in place. Denies blurred vision.

## 2023-10-12 ENCOUNTER — Ambulatory Visit (HOSPITAL_COMMUNITY): Payer: Self-pay | Admitting: Pharmacist

## 2023-10-14 ENCOUNTER — Ambulatory Visit (HOSPITAL_COMMUNITY)
Admission: RE | Admit: 2023-10-14 | Discharge: 2023-10-14 | Disposition: A | Discharge status: Home / Self Care | Payer: Medicare PPO | Source: Ambulatory Visit | Attending: Internal Medicine | Admitting: Internal Medicine

## 2023-10-14 ENCOUNTER — Ambulatory Visit (HOSPITAL_BASED_OUTPATIENT_CLINIC_OR_DEPARTMENT_OTHER)
Admission: RE | Admit: 2023-10-14 | Discharge: 2023-10-14 | Disposition: A | Discharge status: Home / Self Care | Source: Ambulatory Visit | Attending: Cardiology | Admitting: Cardiology

## 2023-10-14 VITALS — BP 107/80 | HR 121 | Wt 185.0 lb

## 2023-10-14 DIAGNOSIS — I420 Dilated cardiomyopathy: Secondary | ICD-10-CM

## 2023-10-14 DIAGNOSIS — Z95811 Presence of heart assist device: Secondary | ICD-10-CM | POA: Diagnosis not present

## 2023-10-14 DIAGNOSIS — I5023 Acute on chronic systolic (congestive) heart failure: Secondary | ICD-10-CM | POA: Diagnosis not present

## 2023-10-14 DIAGNOSIS — Z4801 Encounter for change or removal of surgical wound dressing: Secondary | ICD-10-CM | POA: Insufficient documentation

## 2023-10-14 LAB — ECHOCARDIOGRAM COMPLETE
Area-P 1/2: 5.06 cm2
Est EF: 20
S' Lateral: 5.4 cm

## 2023-10-14 NOTE — Progress Notes (Signed)
 Patient presents to VAD Clinic for dressing change. Pt reports no issues with VAD equipment. Pt's wife reports that his drive line site is improving she has continued to change dressing daily.   Pt was seen in at [facility] over the weekend complaining of numbness and tingling in his legs along with dizziness, shortness of breath, headache and feeling like his jaw was drawing. Work up negative and pt discharged home. Pt continues to complain of generalized weakness with dizziness and lightheadedness. He attributed these symptoms to anxiety/panic attack and took Valium for relief at home. Pt denies vision changes or recent sick contacts. Discussed with Dr. Gasper Lloyd will obtain Echo today and get orthostatics.   Pt continues to take Cefadroxil 1000mg  BID end date 10/19/23.   Labs collected 10/11/23 and [facility].  Vital Signs:   HR BP Speed Flow Power PI  Lying 90 106/88(96) 5500 4.0 4.2w 4.0  Sitting 99 112/98 (106) 5500 3.8 4.2w 4.2  Standing 121 107/80 (89) 5500 3.8 4.1w 4.3   Doppler Pressure: 100 HR: 90 NSR SPO2: 96%    Weight: 185 lbs lb w/ eqt Last weight: 194 lb w/ eqt  VAD Indication: Destination Therapy for age   VAD interrogation & Equipment Management: Speed: 5500 Flow: 4.0 Power: 4.2w    PI: 4.0   Alarms: none Events: 10 - 15 PI events  Fixed speed 5500 Low speed limit: 5200   Primary Controller:  Replace back up battery in 20 months. Back up controller:  Replace back up battery in 20 months.   Annual Equipment Maintenance on UBC/PM was performed on 09/29/23.    I reviewed the LVAD parameters from today and compared the results to the patient's prior recorded data. LVAD interrogation was NEGATIVE for significant power changes, NEGATIVE for clinical alarms and STABLE for PI events/speed drops. No programming changes were made and pump is functioning within specified parameters. Pt is performing daily controller and system monitor self tests along with completing weekly and  monthly maintenance for LVAD equipment.   LVAD equipment check completed and is in good working order. Back-up equipment present.   Exit Site Care: Existing VAD dressing removed and site care performed using sterile technique. Drive line exit site cleaned with Chlora prep applicators x 2, allowed to dry, then rinsed with sterile saline wipe. Gauze dressing without silver strip applied. Small gauze placed under exit site beneath drive line to prevent pressure injury. Exit site healed and partially incorporated, the velour is fully implanted at exit site. Slight opening of skin under drive line noted. Unable to culture today as opening is too small. Small amount of thin yellow drainage noted on previous dressing. Unable to express drainage from site. Small area of induration with tenderness with palpation noted above exit site near ribs, but does not track along drive line. No redness, foul odor or rash noted. Pt denies fever or chills. Advance to Monday, Wednesday, Friday dressing changes.  Excoriated area where previous anchor was placed cleansed with VASHE then covered wet to dry with VASHE soaked gauze and covered with tegaderm. Anchor applied over this dressing. Pt's wife Erie Noe told to continue to use VASHE until rash clears and to NOT place anchor on bare skin to prevent irritation. Given 7 daily kits, VASHE, small tegaderms and 4 x 4's for home use.      Device: none  BP & Labs:  Doppler 100 - Doppler is reflecting modified systolic    Hgb 15.7 - No S/S of bleeding. Specifically  denies melena/BRBPR or nosebleeds.   LDH stable at --- with established baseline of 170 - 208. Denies tea-colored urine. No power elevations noted on interrogation.   Plan: No medication changes today continue to stay hydrated and rest.  Stop date for Cefadroxil 3/10 please monitor you symptoms after completion of antibiotic Call VAD Coordinator if symptoms worsen  Simmie Davies RN,BSN VAD Coordinator   Office: [phone]  24/7 Pager: [phone]

## 2023-10-19 ENCOUNTER — Ambulatory Visit (HOSPITAL_COMMUNITY): Payer: Self-pay | Admitting: Pharmacist

## 2023-10-19 LAB — POCT INR: INR: 2.2 (ref 2.0–3.0)

## 2023-10-26 ENCOUNTER — Ambulatory Visit (HOSPITAL_COMMUNITY): Payer: Self-pay | Admitting: Pharmacist

## 2023-10-26 LAB — POCT INR: INR: 1.7 — AB (ref 2.0–3.0)

## 2023-10-28 ENCOUNTER — Ambulatory Visit (HOSPITAL_COMMUNITY)
Admission: RE | Admit: 2023-10-28 | Discharge: 2023-10-28 | Disposition: A | Discharge status: Home / Self Care | Source: Ambulatory Visit | Attending: Cardiology | Admitting: Cardiology

## 2023-10-28 DIAGNOSIS — Z4801 Encounter for change or removal of surgical wound dressing: Secondary | ICD-10-CM | POA: Insufficient documentation

## 2023-10-28 DIAGNOSIS — Z95811 Presence of heart assist device: Secondary | ICD-10-CM | POA: Diagnosis not present

## 2023-10-28 DIAGNOSIS — T827XXA Infection and inflammatory reaction due to other cardiac and vascular devices, implants and grafts, initial encounter: Secondary | ICD-10-CM

## 2023-10-28 NOTE — Progress Notes (Signed)
 Patient presents to VAD Clinic for dressing change. Pt reports no issues with VAD equipment. Pt's wife reports that his drive line site is improving- changing on M-W-F  Completed Cefadroxil 1000mg  BID on 10/19/23.   Exit Site Care: Existing VAD dressing removed and site care performed using sterile technique. Drive line exit site cleaned with Chlora prep applicators x 2, allowed to dry, then rinsed with sterile saline wipe. Gauze dressing without silver strip applied. Small gauze placed under exit site beneath drive line to prevent pressure injury. Exit site healed and partially incorporated, the velour is fully implanted at exit site. Slight opening of skin under drive line noted. Area of induration resolved. No drainage, redness, foul odor or rash noted. Pt denies fever or chills. Advance to twice a week dressing changes.Given 7 daily kits, 10 anchors, and a medium bottle of VASHE for home use.     Plan: Return to clinic in 2 weeks for wound check. Advance dressing changes to twice a week  Alyce Pagan RN VAD Coordinator  Office: [phone]  24/7 Pager: [phone]

## 2023-11-02 ENCOUNTER — Ambulatory Visit (HOSPITAL_COMMUNITY): Payer: Self-pay | Admitting: Pharmacist

## 2023-11-02 DIAGNOSIS — Z7901 Long term (current) use of anticoagulants: Secondary | ICD-10-CM | POA: Diagnosis not present

## 2023-11-02 LAB — POCT INR: INR: 2.1 (ref 2.0–3.0)

## 2023-11-05 DIAGNOSIS — M19012 Primary osteoarthritis, left shoulder: Secondary | ICD-10-CM | POA: Diagnosis not present

## 2023-11-09 ENCOUNTER — Ambulatory Visit (HOSPITAL_COMMUNITY): Payer: Self-pay | Admitting: Pharmacist

## 2023-11-09 LAB — POCT INR: INR: 1.7 — AB (ref 2.0–3.0)

## 2023-11-11 ENCOUNTER — Ambulatory Visit (HOSPITAL_COMMUNITY)
Admission: RE | Admit: 2023-11-11 | Discharge: 2023-11-11 | Disposition: A | Discharge status: Home / Self Care | Source: Ambulatory Visit | Attending: Cardiology | Admitting: Cardiology

## 2023-11-11 DIAGNOSIS — Z4509 Encounter for adjustment and management of other cardiac device: Secondary | ICD-10-CM | POA: Insufficient documentation

## 2023-11-11 DIAGNOSIS — Z95811 Presence of heart assist device: Secondary | ICD-10-CM

## 2023-11-11 NOTE — Progress Notes (Signed)
 Patient presents to VAD Clinic for dressing change. Pt reports no issues with VAD equipment. Pt's wife reports that his drive line site is improving- changing on M-W-F  Completed Cefadroxil 1000mg  BID on 10/19/23.   Exit Site Care: Existing VAD dressing removed and site care performed using sterile technique. Drive line exit site cleaned with SALINE x 2, allowed to dry, Silverlon dressing applied with Sorbaview.  Exit site healed and incorporated, the velour is fully implanted at exit site. No drainage, redness, foul odor or rash noted. Pt denies fever or chills. Advance to weekly dressing changes using dressing kit of your choice.Given 10 anchors for home use. Pt instructed he may return to showering.  Plan: Return to clinic in 2 weeks for a visit with DR Gasper Lloyd Advance dressing changes to weekly  Carlton Adam RN VAD Coordinator  Office: [phone]  24/7 Pager: [phone]

## 2023-11-16 ENCOUNTER — Ambulatory Visit (HOSPITAL_COMMUNITY): Payer: Self-pay | Admitting: Pharmacist

## 2023-11-16 LAB — POCT INR: INR: 1.9 — AB (ref 2.0–3.0)

## 2023-11-23 ENCOUNTER — Ambulatory Visit (HOSPITAL_COMMUNITY): Payer: Self-pay | Admitting: Pharmacist

## 2023-11-23 LAB — POCT INR: INR: 2.1 (ref 2.0–3.0)

## 2023-11-26 ENCOUNTER — Other Ambulatory Visit (HOSPITAL_COMMUNITY): Payer: Self-pay | Admitting: *Deleted

## 2023-11-26 DIAGNOSIS — Z5181 Encounter for therapeutic drug level monitoring: Secondary | ICD-10-CM

## 2023-11-26 DIAGNOSIS — Z7901 Long term (current) use of anticoagulants: Secondary | ICD-10-CM

## 2023-11-26 DIAGNOSIS — Z95811 Presence of heart assist device: Secondary | ICD-10-CM

## 2023-11-26 NOTE — Addendum Note (Signed)
 Addended by: Paulo Bosworth B on: 11/26/2023 01:19 PM   Modules accepted: Orders

## 2023-11-26 NOTE — Progress Notes (Addendum)
 ADVANCED HEART FAILURE CLINIC NOTE  Referring Physician: Orlena Bitters, MD  Primary Care: Orlena Bitters, MD Primary Cardiologist: [facility] Heart Failure: Ricardo Zuniga  CC: Stage D / End-stage heart failure s/p HMIII LVAD  HPI: Ricardo Zuniga is a very pleasant 73 YO WM w/ HFrEF 2/2 nonischemic cardiomyopathy, T2DM and hx of acute promyelocytic leukemia s/p ATRA ([facility] 2014) that was initially see at [facility] in October 2023 after presenting with a 1 week history of orthopnea, PND & LE edema. He had a TTE prior to admission w/ LVEF of 40-45%. Echo during admit was notable for LVEF of 30%-35%. LHC/RHC during admit w/ nonobstructive CAD & moderately reduced CI (2.1 L/min/m2). His cardiac history dates back to 2015 when he was found to have an LVEF of 25% felt to be triggered by endocarditis. He had improvement in LV function for several years, however, unfortunately had a fairly rapid decline in LVEF and functional status over the winter months of 2023. The decision was made to proceed with HMIII implantation. [name] underwent successful implant on 09/24/22 as DT. His post-operative course was fairly unremarkable. He had rapid improvement in PA pressures after HMIII implantation.   Interval hx: - Doing very well today; no complaints. Euvolemic on exam.  - MAPs remain elevated; no lightheadedness. Driveline site looks good.    Current Outpatient Medications  Medication Sig Dispense Refill   acetaminophen  (TYLENOL ) 650 MG CR tablet Take 650-1,300 mg by mouth every 8 (eight) hours as needed for pain.     atorvastatin  (LIPITOR ) 40 MG tablet Take 1 tablet by mouth once daily 90 tablet 0   Cholecalciferol  (VITAMIN D3) 125 MCG (5000 UT) TABS Take 5,000 Units by mouth in the morning.     Digoxin  62.5 MCG TABS Take 1 tablet by mouth daily. (Patient taking differently: Take 62.5 mcg by mouth every evening.) 90 tablet 3   docusate sodium  (COLACE) 100 MG capsule Take 100 mg by  mouth every evening.     empagliflozin  (JARDIANCE ) 10 MG TABS tablet Take 10 mg by mouth every evening.     furosemide  (LASIX ) 20 MG tablet Take 1 tablet (20 mg total) by mouth as needed.     omeprazole (PRILOSEC OTC) 20 MG tablet Take 20 mg by mouth daily.     repaglinide  (PRANDIN ) 2 MG tablet Take 2-4 mg by mouth See admin instructions. Take 2 tablets (4 mg) by mouth in the morning & take 1 tablet (2 mg) by mouth in the evening.     sodium fluoride (DENTA 5000 PLUS) 1.1 % CREA dental cream Place 1 Application onto teeth every evening.     traZODone  (DESYREL ) 100 MG tablet TAKE 1 TABLET BY MOUTH AT BEDTIME AS NEEDED FOR  SLEEP (Patient taking differently: Take 100 mg by mouth at bedtime.) 90 tablet 3   warfarin (COUMADIN ) 5 MG tablet Take 5 mg (1 tab) daily or as directed by HF Clinic (Patient taking differently: Take 5 mg by mouth every evening.) 90 tablet 3   zinc sulfate, 50mg  elemental zinc, 220 (50 Zn) MG capsule Take 220 mg by mouth daily.     No current facility-administered medications for this visit.    PHYSICAL EXAM:  Vital Signs:  Automatic BP: 99/80 (88) Doppler Pressure: 96 HR:  79 SPO2: 98%    Weight: 194 lbs lb w/ eqt Last weight: 190.6 lb w/ eqt   VAD Indication: Destination Therapy for age   VAD interrogation & Equipment  Management: Speed: 5500 Flow: 3.8 Power: 4.2w PI: 4.7   Alarms: none Events: 5-10 PI events  Fixed speed 5500 Low speed limit: 5200 Vitals:   11/27/23 1533 11/27/23 1534  BP: (!) 120/0 101/86  Pulse:  80  SpO2:  97%   GENERAL: NAD Lungs- CTA CARDIAC:  JVP: 6 cm          Normal rate with regular rhythm. no murmur.  Pulses 2+. no edema.  ABDOMEN: Soft, non-tender, non-distended.  EXTREMITIES: Warm and well perfused.  NEUROLOGIC: No obvious FND    ECHO: - Echo (06/08/22): EF 30-35%, global HK, RV mildly reduced, mild MR   - Echo (7/23): EF 45%, RV normal   - Echo (8/22): EF 40-45%, RV normal   - Echo (7/21): EF 50%, RV normal   - LHC (09/2018): Moderate nonobstructive one-vessel coronary artery disease, prox RCA 40% stenosed.   - Echo (08/2018): EF 40%, RV normal  - Echo (05/2016): EF 35-40%, RV normal   - Echo (05/2014): EF 25-30%, RV mildly reduced  - Echo (05/2012): EF 50%, RV normal  - Echo (1/24): EF 25%, severe LV dilation, global hypokinesis, moderate RV dysfunction, mild RV enlargement, moderate AI, mild-moderate MR. - Echo (11/27/22): HMIII LVAD in place. Ramp study from 5500RPM-5800RPM; trivial MR throughout. Septum midline. RV enlarged but with preserved function.   CATH: R/LHC (10/23): nonobstructive CAD RA 8 PA 52/25 (34 mean) PCWP 14 (mean) CO/CI (Fick): 4.3/2.1                                 PVR 4.6 WU          PAPi 3.4             RHC (1/24): RA mean 10 PA 69/28, mean 43 PCWP mean 20 CI 1.85 PVR 6.22 WU PAPi 4.1     ASSESSMENT & PLAN:  1. Chronic systolic heart failure s/p HMIII Destination therapy - Significant improvement in functional status since HMIII placement. Doing very well functionally.  - Continue digoxin  62.5 mcg daily. Repeat digoxin  elvel today.  - Continue jardiance  10mg  daily.  - Euvolemic on exam; continue lasix  20mg  PRN; has not required lasix  dosing for several weeks.  - Remains hypertensive on exam today; will add amlodipine  2.5mg  daily. He is very averse to medication changes due to multiple episodes of symptomatic hypotension in the past.  - INR 1.7; discussed with pharmD.  - LDH stable at 190. sCr 0.83.  - RAMP echo as noted above; at 5700 RPM patient had bowing of the septum to the left. Will maintain speeds at 5500 RPM. Driveline site looks good.   2. Hx of atrial tach, PVCs & NSVT -Resolved on Zio patch.  Amiodarone  discontinued -Telemetry stable today.   3. Hx of promyelocytic leukemia: S/P ATRA in 2014 at [facility].   4. Hx of endocarditis: 2015  5. Hypertension - see above; add amlodipine  2.5mg  daily.   I reviewed the LVAD parameters from today and  compared the results to the patient's prior recorded data. LVAD interrogation was NEGATIVE for significant power changes, NEGATIVE for clinical alarms and STABLE for PI events/speed drops. No programming changes were made and pump is functioning within specified parameters. Pt is performing daily controller and system monitor self tests along with completing weekly and monthly maintenance for LVAD equipment.    Brailyn Killion 11/26/2023 10:17 PM

## 2023-11-27 ENCOUNTER — Ambulatory Visit (HOSPITAL_COMMUNITY)
Admission: RE | Admit: 2023-11-27 | Discharge: 2023-11-27 | Disposition: A | Discharge status: Home / Self Care | Payer: Medicare PPO | Source: Ambulatory Visit | Attending: Cardiology | Admitting: Cardiology

## 2023-11-27 VITALS — BP 101/86 | HR 80 | Ht 69.0 in | Wt 191.6 lb

## 2023-11-27 DIAGNOSIS — Z7901 Long term (current) use of anticoagulants: Secondary | ICD-10-CM | POA: Diagnosis not present

## 2023-11-27 DIAGNOSIS — Z79899 Other long term (current) drug therapy: Secondary | ICD-10-CM | POA: Insufficient documentation

## 2023-11-27 DIAGNOSIS — Z95811 Presence of heart assist device: Secondary | ICD-10-CM | POA: Diagnosis not present

## 2023-11-27 DIAGNOSIS — Z4801 Encounter for change or removal of surgical wound dressing: Secondary | ICD-10-CM | POA: Diagnosis not present

## 2023-11-27 DIAGNOSIS — Z5181 Encounter for therapeutic drug level monitoring: Secondary | ICD-10-CM | POA: Insufficient documentation

## 2023-11-27 DIAGNOSIS — I10 Essential (primary) hypertension: Secondary | ICD-10-CM

## 2023-11-27 DIAGNOSIS — Z48812 Encounter for surgical aftercare following surgery on the circulatory system: Secondary | ICD-10-CM | POA: Diagnosis not present

## 2023-11-27 LAB — BASIC METABOLIC PANEL WITH GFR
Anion gap: 11 (ref 5–15)
BUN: 18 mg/dL (ref 8–23)
CO2: 23 mmol/L (ref 22–32)
Calcium: 9.6 mg/dL (ref 8.9–10.3)
Chloride: 104 mmol/L (ref 98–111)
Creatinine, Ser: 0.83 mg/dL (ref 0.61–1.24)
GFR, Estimated: >60 mL/min (ref >60)
Glucose, Bld: 142 mg/dL — ABNORMAL HIGH (ref 70–99)
Potassium: 4.8 mmol/L (ref 3.5–5.1)
Sodium: 138 mmol/L (ref 135–145)

## 2023-11-27 LAB — CBC
HCT: 45.3 % (ref 39.0–52.0)
Hemoglobin: 15.9 g/dL (ref 13.0–17.0)
MCH: 30.3 pg (ref 26.0–34.0)
MCHC: 35.1 g/dL (ref 30.0–36.0)
MCV: 86.5 fL (ref 80.0–100.0)
Platelets: 202 10*3/uL (ref 150–400)
RBC: 5.24 MIL/uL (ref 4.22–5.81)
RDW: 14.4 % (ref 11.5–15.5)
WBC: 6.5 10*3/uL (ref 4.0–10.5)
nRBC: 0 % (ref 0.0–0.2)

## 2023-11-27 LAB — PROTIME-INR
INR: 1.7 — ABNORMAL HIGH (ref 0.8–1.2)
Prothrombin Time: 20.6 s — ABNORMAL HIGH (ref 11.4–15.2)

## 2023-11-27 LAB — LACTATE DEHYDROGENASE: LDH: 190 U/L (ref 98–192)

## 2023-11-27 LAB — DIGOXIN LEVEL: Digoxin Level: 0.2 ng/mL — ABNORMAL LOW (ref 0.8–2.0)

## 2023-11-27 MED ORDER — AMLODIPINE BESYLATE 2.5 MG PO TABS: 2.5000 mg | ORAL_TABLET | Freq: Every day | ORAL | 3 refills | Status: AC

## 2023-11-27 NOTE — Patient Instructions (Signed)
 Start Amlodipine  2.5 daily Return to clinic in 2 months

## 2023-11-27 NOTE — Progress Notes (Signed)
 Patient presents for 2 mo f/u visit in VAD Clinic today with his wife Sherian Dimitri. Reports no problems with VAD equipment or concerns with drive line.  Patient states he feels great and denies syncope or signs of bleeding.  He tells me that at times he gets dizzy but cannot pinpoint a direct correlation to anything whether walking, sitting, standing etc.  Vital Signs:  Automatic BP: 101/86 (93) Doppler Pressure: 120 HR:  80 SPO2: 97%    Weight: 191.6 lbs lb w/ eqt Last weight: 194 lb w/ eqt  VAD Indication: Destination Therapy for age   VAD interrogation & Equipment Management: Speed: 5500 Flow: 3.8 Power: 4.2w    PI: 4.7   Alarms: none Events: 5 - 10 PI events  Fixed speed 5500 Low speed limit: 5200   Primary Controller:  Replace back up battery in 18 months. Back up controller:  Replace back up battery in 18 months.   Annual Equipment Maintenance on UBC/PM was performed on 09/29/23.    I reviewed the LVAD parameters from today and compared the results to the patient's prior recorded data. LVAD interrogation was NEGATIVE for significant power changes, NEGATIVE for clinical alarms and STABLE for PI events/speed drops. No programming changes were made and pump is functioning within specified parameters. Pt is performing daily controller and system monitor self tests along with completing weekly and monthly maintenance for LVAD equipment.   LVAD equipment check completed and is in good working order. Back-up equipment present.    Exit Site Care: CDI. Existing VAD dressing removed and site care performed using sterile technique. Drive line exit site cleaned with SALINE x 2, allowed to dry, covered with gauze and tape per pts perference.  Exit site healed and incorporated, the velour is fully implanted at exit site. No drainage, redness, foul odor or rash noted. Pt denies fever or chills. Pt given 7 daily dressing kits, 6 anchors, and 3 boxes of large tegaderms for home use.   Device:  none  BP & Labs: Doppler 120 - Doppler is reflecting modified systolic    Hgb 15.9 - No S/S of bleeding. Specifically denies melena/BRBPR or nosebleeds.   LDH stable at 170 with established baseline of 170 - 208. Denies tea-colored urine. No power elevations noted on interrogation.   Plan: Start Amlodipine  2.5 mg daily Coumadin  dosing per Lauren PharmD Return to VAD clinic in 2 months   Adams Adams RN,BSN VAD Coordinator  Office: [phone]  24/7 Pager: [phone]

## 2023-11-30 ENCOUNTER — Ambulatory Visit (HOSPITAL_COMMUNITY): Payer: Self-pay | Admitting: Pharmacist

## 2023-11-30 DIAGNOSIS — Z7901 Long term (current) use of anticoagulants: Secondary | ICD-10-CM | POA: Diagnosis not present

## 2023-11-30 LAB — POCT INR: INR: 1.7 — AB (ref 2.0–3.0)

## 2023-12-03 DIAGNOSIS — F419 Anxiety disorder, unspecified: Secondary | ICD-10-CM | POA: Diagnosis not present

## 2023-12-03 DIAGNOSIS — E1165 Type 2 diabetes mellitus with hyperglycemia: Secondary | ICD-10-CM | POA: Diagnosis not present

## 2023-12-03 DIAGNOSIS — I10 Essential (primary) hypertension: Secondary | ICD-10-CM | POA: Diagnosis not present

## 2023-12-03 DIAGNOSIS — Z299 Encounter for prophylactic measures, unspecified: Secondary | ICD-10-CM | POA: Diagnosis not present

## 2023-12-07 ENCOUNTER — Ambulatory Visit (HOSPITAL_COMMUNITY): Payer: Self-pay | Admitting: Pharmacist

## 2023-12-07 LAB — POCT INR: INR: 2.1 (ref 2.0–3.0)

## 2023-12-12 ENCOUNTER — Other Ambulatory Visit (HOSPITAL_COMMUNITY): Payer: Self-pay | Admitting: Cardiology

## 2023-12-14 ENCOUNTER — Ambulatory Visit (HOSPITAL_COMMUNITY): Payer: Self-pay | Admitting: Pharmacist

## 2023-12-14 LAB — POCT INR: INR: 2.3 (ref 2.0–3.0)

## 2023-12-21 ENCOUNTER — Ambulatory Visit (HOSPITAL_COMMUNITY): Payer: Self-pay | Admitting: Pharmacist

## 2023-12-21 LAB — POCT INR: INR: 2 (ref 2.0–3.0)

## 2023-12-24 ENCOUNTER — Other Ambulatory Visit (HOSPITAL_COMMUNITY): Payer: Self-pay | Admitting: Cardiology

## 2023-12-28 ENCOUNTER — Other Ambulatory Visit (HOSPITAL_COMMUNITY): Payer: Self-pay | Admitting: Pharmacist

## 2023-12-28 ENCOUNTER — Ambulatory Visit (HOSPITAL_COMMUNITY): Payer: Self-pay | Admitting: Pharmacist

## 2023-12-28 DIAGNOSIS — Z7901 Long term (current) use of anticoagulants: Secondary | ICD-10-CM | POA: Diagnosis not present

## 2023-12-28 LAB — POCT INR: INR: 1.8 — AB (ref 2.0–3.0)

## 2023-12-28 MED ORDER — WARFARIN SODIUM 5 MG PO TABS: ORAL_TABLET | ORAL | 3 refills | Status: AC

## 2024-01-05 ENCOUNTER — Ambulatory Visit (HOSPITAL_COMMUNITY): Payer: Self-pay | Admitting: Pharmacist

## 2024-01-05 LAB — POCT INR: INR: 2.3 (ref 2.0–3.0)

## 2024-01-11 ENCOUNTER — Ambulatory Visit (HOSPITAL_COMMUNITY): Payer: Self-pay | Admitting: Pharmacist

## 2024-01-11 LAB — POCT INR: INR: 2.3 (ref 2.0–3.0)

## 2024-01-13 ENCOUNTER — Encounter (INDEPENDENT_AMBULATORY_CARE_PROVIDER_SITE_OTHER): Payer: Self-pay | Admitting: Otolaryngology

## 2024-01-13 ENCOUNTER — Ambulatory Visit (INDEPENDENT_AMBULATORY_CARE_PROVIDER_SITE_OTHER): Admitting: Otolaryngology

## 2024-01-13 VITALS — BP 117/86 | HR 105 | Ht 70.0 in | Wt 178.0 lb

## 2024-01-13 DIAGNOSIS — J310 Chronic rhinitis: Secondary | ICD-10-CM

## 2024-01-13 DIAGNOSIS — J343 Hypertrophy of nasal turbinates: Secondary | ICD-10-CM

## 2024-01-13 DIAGNOSIS — R0981 Nasal congestion: Secondary | ICD-10-CM

## 2024-01-13 DIAGNOSIS — H8111 Benign paroxysmal vertigo, right ear: Secondary | ICD-10-CM | POA: Diagnosis not present

## 2024-01-13 DIAGNOSIS — J342 Deviated nasal septum: Secondary | ICD-10-CM

## 2024-01-13 DIAGNOSIS — R42 Dizziness and giddiness: Secondary | ICD-10-CM

## 2024-01-14 DIAGNOSIS — R42 Dizziness and giddiness: Secondary | ICD-10-CM | POA: Insufficient documentation

## 2024-01-14 DIAGNOSIS — J310 Chronic rhinitis: Secondary | ICD-10-CM | POA: Insufficient documentation

## 2024-01-16 DIAGNOSIS — J343 Hypertrophy of nasal turbinates: Secondary | ICD-10-CM | POA: Insufficient documentation

## 2024-01-16 DIAGNOSIS — H8111 Benign paroxysmal vertigo, right ear: Secondary | ICD-10-CM | POA: Insufficient documentation

## 2024-01-16 DIAGNOSIS — J342 Deviated nasal septum: Secondary | ICD-10-CM | POA: Insufficient documentation

## 2024-01-16 NOTE — Progress Notes (Signed)
 Patient ID: Ricardo Zuniga, male   DOB: [birth year], 73 y.o.   MRN: [id number]  New complaint: Recurrent dizziness Follow-up: Chronic nasal congestion, recurrent sinusitis  HPI: The patient is a 73 year old male who presents today with a new complaint of recurrent dizziness.  The patient was last seen in September 2023 for his chronic nasal congestion and recurrent sinusitis.  He was noted to have nasal mucosal congestion, nasal septal deviation, and bilateral inferior turbinate hypertrophy.  He was treated with Flonase  and Zyrtec.  The patient returns today reporting occasional nasal congestion, especially during the allergy seasons.  He is currently on Claritin  and nasal saline irrigation.  He has a new complaint today of recurrent dizziness.  He describes his dizziness as a spinning vertigo that lasts for minutes.  He also has chronic off-balance and lightheaded sensation.  He has been using Valium  as needed to treat his dizziness.  Currently he denies any otalgia, otorrhea, or change in his hearing.  Exam: General: Communicates without difficulty, well nourished, no acute distress. Head: Normocephalic, no evidence injury, no tenderness, facial buttresses intact without stepoff. Face/sinus: No tenderness to palpation and percussion. Facial movement is normal and symmetric. Eyes: PERRL, EOMI. No scleral icterus, conjunctivae clear. Neuro: CN II exam reveals vision grossly intact.  No nystagmus at any point of gaze. Ears: Auricles well formed without lesions.  Ear canals are intact without mass or lesion.  No erythema or edema is appreciated.  The TMs are intact without fluid. Nose: External evaluation reveals normal support and skin without lesions.  Dorsum is intact.  Anterior rhinoscopy reveals congested mucosa over anterior aspect of inferior turbinates and deviated septum.  No purulence noted. Oral:  Oral cavity and oropharynx are intact, symmetric, without erythema or edema.  Mucosa is moist without  lesions. Neck: Full range of motion without pain.  There is no significant lymphadenopathy.  No masses palpable.  Thyroid  bed within normal limits to palpation.  Parotid glands and submandibular glands equal bilaterally without mass.  Trachea is midline. Neuro:  CN 2-12 grossly intact. Vestibular: No nystagmus at any point of gaze.  Vestibular: Dix-Hallpike produces rotational nystagmus with right-sided positioning. Vestibular: There is no nystagmus with pneumatic pressure on either tympanic membrane or Valsalva. The cerebellar examination is unremarkable.    Procedure: Right-sided Epley maneuver  Anesthesia: None Indication: To treat the right BPPV Desciption:  The patient is first placed in a right-sided Dix-Hallpike position. After the vertigo has subsided, the head is gradually rotated from the right to the left, completing a 180 turn. The patient is then returned to the upright position. The patient tolerated the procedure well without any difficulty.    Assessment: 1.  Recurrent dizziness, likely secondary to right benign paroxysmal positional vertigo. 2.  The patient's right Dix-Hallpike maneuver is positive.  His ear canals, tympanic membranes, and middle ear spaces are otherwise normal. 3.  Chronic rhinitis with nasal mucosal congestion, nasal septal deviation, and bilateral inferior turbinate hypertrophy.  No acute infection is noted today.  Plan: 1.  The physical exam findings are reviewed with the patient. 2.  The pathophysiology of dizziness and BPPV are extensively discussed.  Questions were invited and answered. 3.  The right Epley maneuver is performed today without difficulty. 4.  The post Epley activity restrictions are discussed. 5.  Continue with Claritin  and Flonase  nasal spray.  Nasal saline irrigation is encouraged. 6.  The patient will return for reevaluation in 1 month.

## 2024-01-18 ENCOUNTER — Ambulatory Visit (HOSPITAL_COMMUNITY): Payer: Self-pay | Admitting: Pharmacist

## 2024-01-18 LAB — POCT INR: INR: 2.2 (ref 2.0–3.0)

## 2024-01-25 ENCOUNTER — Other Ambulatory Visit (HOSPITAL_COMMUNITY): Payer: Self-pay | Admitting: Unknown Physician Specialty

## 2024-01-25 ENCOUNTER — Ambulatory Visit (HOSPITAL_COMMUNITY): Payer: Self-pay | Admitting: Pharmacist

## 2024-01-25 DIAGNOSIS — Z7901 Long term (current) use of anticoagulants: Secondary | ICD-10-CM

## 2024-01-25 DIAGNOSIS — Z95811 Presence of heart assist device: Secondary | ICD-10-CM

## 2024-01-25 LAB — POCT INR: INR: 2.2 (ref 2.0–3.0)

## 2024-01-25 NOTE — Progress Notes (Signed)
 ADVANCED HEART FAILURE CLINIC NOTE  Referring Physician: Orlena Bitters, MD  Primary Care: Ricardo Bitters, MD Primary Cardiologist: Ricardo Zuniga Heart Failure: Ricardo Zuniga  CC: Stage D / End-stage heart failure s/p HMIII LVAD  HPI: Ricardo Zuniga is a very pleasant 73 YO WM w/ HFrEF 2/2 nonischemic cardiomyopathy, T2DM and hx of acute promyelocytic leukemia s/p ATRA ([facility] 2014) that was initially see at [facility] in October 2023 after presenting with a 1 week history of orthopnea, PND & LE edema. He had a TTE prior to admission w/ LVEF of 40-45%. Echo during admit was notable for LVEF of 30%-35%. LHC/RHC during admit w/ nonobstructive CAD & moderately reduced CI (2.1 L/min/m2). His cardiac history dates back to 2015 when he was found to have an LVEF of 25% felt to be triggered by endocarditis. He had improvement in LV function for several years, however, unfortunately had a fairly rapid decline in LVEF and functional status over the winter months of 2023. The decision was made to proceed with HMIII implantation. Ricardo Zuniga underwent successful implant on 09/24/22 as DT. His post-operative course was fairly unremarkable. He had rapid improvement in PA pressures after HMIII implantation.   Interval hx: - He has been doing very well since his last visit from a functional standpoint.  Recently took a trip to the beach with his family. - Compliant with all medications.  No  shortness of breath, lower extremity edema. - Driveline site well-managed. - He recently saw Dr. Darlin Zuniga where Epley maneuver was positive.  Since that time he has been on Valium  as needed.  Reports that he is unable to tolerate meclizine   Current Outpatient Medications  Medication Sig Dispense Refill   ACCU-CHEK GUIDE TEST test strip      acetaminophen  (TYLENOL ) 650 MG CR tablet Take 650-1,300 mg by mouth every 8 (eight) hours as needed for pain.     amLODipine  (NORVASC ) 2.5 MG tablet Take 1 tablet (2.5 mg  total) by mouth daily. 90 tablet 3   atorvastatin  (LIPITOR ) 40 MG tablet Take 1 tablet by mouth once daily 90 tablet 0   Cholecalciferol  (VITAMIN D3) 125 MCG (5000 UT) TABS Take 5,000 Units by mouth in the morning.     diazepam  (VALIUM ) 5 MG tablet Take 5 mg by mouth daily as needed.     Digoxin  62.5 MCG TABS Take 1 tablet by mouth daily. 90 tablet 3   docusate sodium  (COLACE) 100 MG capsule Take 100 mg by mouth every evening.     empagliflozin  (JARDIANCE ) 10 MG TABS tablet Take 10 mg by mouth every evening.     furosemide  (LASIX ) 20 MG tablet Take 1 tablet (20 mg total) by mouth as needed.     omeprazole (PRILOSEC OTC) 20 MG tablet Take 20 mg by mouth daily.     repaglinide  (PRANDIN ) 2 MG tablet Take 2-4 mg by mouth See admin instructions. Take 2 tablets (4 mg) by mouth in the morning & take 1 tablet (2 mg) by mouth in the evening.     sodium fluoride (DENTA 5000 PLUS) 1.1 % CREA dental cream Place 1 Application onto teeth every evening.     traZODone  (DESYREL ) 100 MG tablet TAKE 1 TABLET BY MOUTH AT BEDTIME AS NEEDED FOR  SLEEP 90 tablet 3   warfarin (COUMADIN ) 5 MG tablet TAKE ONE TABLET (5 MG) DAILY OR AS DIRECTED BY HF CLINIC 100 tablet 3   zinc sulfate, 50mg  elemental zinc, 220 (50 Zn)  MG capsule Take 220 mg by mouth daily.     No current facility-administered medications for this encounter.    PHYSICAL EXAM:  Vital Signs:  Automatic BP: 108/94 (100) Repeat: 96/57 (91) Doppler Pressure: 88 HR: 82 SPO2: 97%    Weight: 196.4 lbs lb w/ eqt Last weight: 191.6 lb w/ eqt     VAD Indication: Destination Therapy for age   VAD interrogation & Equipment Management: Speed: 5500 Flow: 3.9 Power: 4.1 PI: 4.1   Alarms: none Events: 15-30  Fixed speed 5500 Low speed limit: 5200 Vitals:   01/26/24 1119 01/26/24 1135  BP: (!) 88/0 (!) 96/57  Pulse:    SpO2:      GENERAL: NAD Lungs- CTA CARDIAC:  JVP: 6cm          Normal LVAD hum; no edema.  ABDOMEN: Soft, non-tender,  non-distended.  EXTREMITIES: warm    ECHO: - Echo (06/08/22): EF 30-35%, global HK, RV mildly reduced, mild MR   - Echo (7/23): EF 45%, RV normal   - Echo (8/22): EF 40-45%, RV normal   - Echo (7/21): EF 50%, RV normal  - LHC (09/2018): Moderate nonobstructive one-vessel coronary artery disease, prox RCA 40% stenosed.   - Echo (08/2018): EF 40%, RV normal  - Echo (05/2016): EF 35-40%, RV normal   - Echo (05/2014): EF 25-30%, RV mildly reduced  - Echo (05/2012): EF 50%, RV normal  - Echo (1/24): EF 25%, severe LV dilation, global hypokinesis, moderate RV dysfunction, mild RV enlargement, moderate AI, mild-moderate MR. - Echo (11/27/22): HMIII LVAD in place. Ramp study from 5500RPM-5800RPM; trivial MR throughout. Septum midline. RV enlarged but with preserved function.   CATH: R/LHC (10/23): nonobstructive CAD RA 8 PA 52/25 (34 mean) PCWP 14 (mean) CO/CI (Fick): 4.3/2.1                                 PVR 4.6 WU          PAPi 3.4             RHC (1/24): RA mean 10 PA 69/28, mean 43 PCWP mean 20 CI 1.85 PVR 6.22 WU PAPi 4.1     ASSESSMENT & PLAN:  1. Chronic systolic heart failure s/p HMIII Destination therapy - Significant improvement in functional status since HMIII placement. Doing very well functionally.  - Continue digoxin  62.5 mcg daily. Repeat digoxin  level at follow up.  - Continue jardiance  10mg  daily.  - Euvolemic on exam; continue lasix  20mg  PRN; has not required lasix  dosing for several weeks.  - Remains hypertensive on exam today; will add amlodipine  2.5mg  daily. He is very averse to medication changes due to multiple episodes of symptomatic hypotension in the past.  - INR 1.9; discussed with pharmD.  - LDH stable at 182; sCr 0.83.  - Increase in PI events associated with his recent vacation to the beach; possibly hypovolemia. Will repeat TTE at next visit. MAP at goal.   2. Hx of atrial tach, PVCs & NSVT -Resolved on Zio patch.  Amiodarone   discontinued -Telemetry stable.   3. Hx of promyelocytic leukemia: S/P ATRA in 2014 at [facility].   4. Hx of endocarditis: 2015  5. Hypertension - continue  amlodipine  2.5mg  daily.   6. Dizziness - Positive epley maneuver with Dr. Darlin Zuniga. Valium  PRN.   I reviewed the LVAD parameters from today and compared the results to the patient's prior recorded data. LVAD interrogation  was NEGATIVE for significant power changes, NEGATIVE for clinical alarms and STABLE for PI events/speed drops. No programming changes were made and pump is functioning within specified parameters. Pt is performing daily controller and system monitor self tests along with completing weekly and monthly maintenance for LVAD equipment.    Hanni Milford 01/27/2024 10:00 AM

## 2024-01-26 ENCOUNTER — Encounter (HOSPITAL_COMMUNITY): Payer: Self-pay | Admitting: Cardiology

## 2024-01-26 ENCOUNTER — Ambulatory Visit (HOSPITAL_COMMUNITY)
Admission: RE | Admit: 2024-01-26 | Discharge: 2024-01-26 | Disposition: A | Discharge status: Home / Self Care | Source: Ambulatory Visit | Attending: Cardiology | Admitting: Cardiology

## 2024-01-26 VITALS — BP 96/57 | HR 82 | Wt 196.4 lb

## 2024-01-26 DIAGNOSIS — E119 Type 2 diabetes mellitus without complications: Secondary | ICD-10-CM | POA: Insufficient documentation

## 2024-01-26 DIAGNOSIS — Z79899 Other long term (current) drug therapy: Secondary | ICD-10-CM | POA: Insufficient documentation

## 2024-01-26 DIAGNOSIS — I428 Other cardiomyopathies: Secondary | ICD-10-CM | POA: Diagnosis not present

## 2024-01-26 DIAGNOSIS — Z7901 Long term (current) use of anticoagulants: Secondary | ICD-10-CM | POA: Diagnosis not present

## 2024-01-26 DIAGNOSIS — Z8679 Personal history of other diseases of the circulatory system: Secondary | ICD-10-CM | POA: Insufficient documentation

## 2024-01-26 DIAGNOSIS — I5022 Chronic systolic (congestive) heart failure: Secondary | ICD-10-CM | POA: Insufficient documentation

## 2024-01-26 DIAGNOSIS — I5084 End stage heart failure: Secondary | ICD-10-CM | POA: Insufficient documentation

## 2024-01-26 DIAGNOSIS — I2510 Atherosclerotic heart disease of native coronary artery without angina pectoris: Secondary | ICD-10-CM | POA: Diagnosis not present

## 2024-01-26 DIAGNOSIS — Z856 Personal history of leukemia: Secondary | ICD-10-CM | POA: Insufficient documentation

## 2024-01-26 DIAGNOSIS — R42 Dizziness and giddiness: Secondary | ICD-10-CM | POA: Diagnosis not present

## 2024-01-26 DIAGNOSIS — I5023 Acute on chronic systolic (congestive) heart failure: Secondary | ICD-10-CM | POA: Diagnosis not present

## 2024-01-26 DIAGNOSIS — Z95811 Presence of heart assist device: Secondary | ICD-10-CM | POA: Diagnosis not present

## 2024-01-26 DIAGNOSIS — I110 Hypertensive heart disease with heart failure: Secondary | ICD-10-CM | POA: Diagnosis not present

## 2024-01-26 DIAGNOSIS — Z5181 Encounter for therapeutic drug level monitoring: Secondary | ICD-10-CM | POA: Diagnosis not present

## 2024-01-26 DIAGNOSIS — Z7984 Long term (current) use of oral hypoglycemic drugs: Secondary | ICD-10-CM | POA: Insufficient documentation

## 2024-01-26 LAB — CBC
HCT: 44.8 % (ref 39.0–52.0)
Hemoglobin: 15.8 g/dL (ref 13.0–17.0)
MCH: 29.8 pg (ref 26.0–34.0)
MCHC: 35.3 g/dL (ref 30.0–36.0)
MCV: 84.5 fL (ref 80.0–100.0)
Platelets: 208 10*3/uL (ref 150–400)
RBC: 5.3 MIL/uL (ref 4.22–5.81)
RDW: 13.8 % (ref 11.5–15.5)
WBC: 7 10*3/uL (ref 4.0–10.5)
nRBC: 0 % (ref 0.0–0.2)

## 2024-01-26 LAB — BASIC METABOLIC PANEL WITH GFR
Anion gap: 10 (ref 5–15)
BUN: 16 mg/dL (ref 8–23)
CO2: 22 mmol/L (ref 22–32)
Calcium: 9.4 mg/dL (ref 8.9–10.3)
Chloride: 104 mmol/L (ref 98–111)
Creatinine, Ser: 0.83 mg/dL (ref 0.61–1.24)
GFR, Estimated: >60 mL/min (ref >60)
Glucose, Bld: 132 mg/dL — ABNORMAL HIGH (ref 70–99)
Potassium: 4.3 mmol/L (ref 3.5–5.1)
Sodium: 136 mmol/L (ref 135–145)

## 2024-01-26 LAB — PROTIME-INR
INR: 1.9 — ABNORMAL HIGH (ref 0.8–1.2)
Prothrombin Time: 22.2 s — ABNORMAL HIGH (ref 11.4–15.2)

## 2024-01-26 LAB — LACTATE DEHYDROGENASE: LDH: 182 U/L (ref 98–192)

## 2024-01-26 NOTE — Progress Notes (Signed)
 Patient presents for 2 mo f/u visit in VAD Clinic today with his wife Ricardo Zuniga. Reports no problems with VAD equipment or concerns with drive line.  Patient states he has been feeling well they just got back from beach with his family. Denies lightheadedness, dizziness, falls, shortness of breath and signs of bleeding. Patient states he has a wet cough in the evenings and at night and feels like it is related to his recurrent sinusitis.  Patient continues to remain active in the cardiac rehab maintenance program at [facility].   Patient states he recently saw Dr. Darlin Zuniga to discuss his ongoing dizziness. MD felt this was due to vertigo and Epley maneuver was performed. Pt states vertigo has improved some since his visit and he is using Valium  to treat it as needed. He states he has tried Meclizine in the past without success.   Vital Signs:  Automatic BP: 108/94 (100) Repeat: 96/57 (91) Doppler Pressure: 88 HR: 82 SPO2: 97%    Weight: 196.4 lbs lb w/ eqt Last weight: 191.6 lb w/ eqt  VAD Indication: Destination Therapy for age   VAD interrogation & Equipment Management: Speed: 5500 Flow: 3.9 Power: 4.1w    PI: 4.1   Alarms: none Events: 15-30 PI events  Fixed speed 5500 Low speed limit: 5200   Primary Controller:  Replace back up battery in 16 months. Back up controller:  Replace back up battery in 16 months.   Annual Equipment Maintenance on UBC/PM was performed on 09/29/23.    I reviewed the LVAD parameters from today and compared the results to the patient's prior recorded data. LVAD interrogation was NEGATIVE for significant power changes, NEGATIVE for clinical alarms and STABLE for PI events/speed drops. No programming changes were made and pump is functioning within specified parameters. Pt is performing daily controller and system monitor self tests along with completing weekly and monthly maintenance for LVAD equipment.   LVAD equipment check completed and is in good working  order. Back-up equipment present.    Exit Site Care: CDI. Existing VAD CDI. Anchor secure. Being maintained weekly with a daily kit. Denies redness, tenderness or drainage at exit site. Pt has begun to wear VAD shirt at night to prevent trauma. 8 daily kits and 8 anchors given to patient at today's appointment for home use.  Device: none  BP & Labs: Doppler 88 - Doppler is reflecting modified systolic    Hgb 15.8 - No S/S of bleeding. Specifically denies melena/BRBPR or nosebleeds.   LDH stable at 182 with established baseline of 170 - 208. Denies tea-colored urine. No power elevations noted on interrogation.   Plan: No medication changes Coumadin  dosing per Lauren PharmD Return to VAD clinic in 2 months with echocardiogram prior   Ricardo Pope RN,BSN VAD Coordinator  Office: [phone]  24/7 Pager: [phone]

## 2024-01-26 NOTE — Patient Instructions (Signed)
 No medication changes Echo prior to next appointment  Return 2 month for follow up with Dr. Sabharwal

## 2024-01-27 ENCOUNTER — Encounter (HOSPITAL_COMMUNITY): Payer: Self-pay | Admitting: Cardiology

## 2024-02-01 ENCOUNTER — Ambulatory Visit (HOSPITAL_COMMUNITY): Payer: Self-pay | Admitting: Pharmacist

## 2024-02-01 LAB — POCT INR: INR: 1.8 — AB (ref 2.0–3.0)

## 2024-02-08 ENCOUNTER — Ambulatory Visit (HOSPITAL_COMMUNITY): Payer: Self-pay | Admitting: Pharmacist

## 2024-02-08 LAB — POCT INR: INR: 2 (ref 2.0–3.0)

## 2024-02-15 ENCOUNTER — Ambulatory Visit (HOSPITAL_COMMUNITY): Payer: Self-pay | Admitting: Pharmacist

## 2024-02-15 LAB — POCT INR: INR: 2.3 (ref 2.0–3.0)

## 2024-02-18 ENCOUNTER — Ambulatory Visit (INDEPENDENT_AMBULATORY_CARE_PROVIDER_SITE_OTHER): Admitting: Otolaryngology

## 2024-02-18 ENCOUNTER — Encounter (INDEPENDENT_AMBULATORY_CARE_PROVIDER_SITE_OTHER): Payer: Self-pay | Admitting: Otolaryngology

## 2024-02-18 VITALS — BP 118/79 | HR 85

## 2024-02-18 DIAGNOSIS — J310 Chronic rhinitis: Secondary | ICD-10-CM | POA: Diagnosis not present

## 2024-02-18 DIAGNOSIS — H8111 Benign paroxysmal vertigo, right ear: Secondary | ICD-10-CM | POA: Diagnosis not present

## 2024-02-18 DIAGNOSIS — R42 Dizziness and giddiness: Secondary | ICD-10-CM

## 2024-02-18 DIAGNOSIS — R0981 Nasal congestion: Secondary | ICD-10-CM

## 2024-02-18 DIAGNOSIS — J342 Deviated nasal septum: Secondary | ICD-10-CM

## 2024-02-18 DIAGNOSIS — J343 Hypertrophy of nasal turbinates: Secondary | ICD-10-CM | POA: Diagnosis not present

## 2024-02-18 NOTE — Progress Notes (Signed)
 Patient ID: Ricardo Zuniga, male   DOB: 13-Apr-1951, 73 y.o.   MRN: [id number]  Follow-up: Recurrent dizziness, chronic nasal congestion  HPI: The patient is a 73 year old male who returns today for his follow-up evaluation.  He was last seen 1 month ago.  At that time, he was complaining of recurrent dizziness.  His right Dix-Hallpike maneuver was positive, consistent with right benign paroxysmal positional vertigo.  He was treated with the right Epley maneuver.  The patient also has a history of chronic rhinitis, with nasal septal deviation and bilateral inferior turbinate hypertrophy.  He was treated with Claritin  and Flonase .  The patient returns today reporting significant improvement in his dizziness.  He has not noted any significant spinning vertigo.  His nasal congestion has also decreased.  Currently he denies any facial pain, fever, or visual change.  Exam: General: Communicates without difficulty, well nourished, no acute distress. Head: Normocephalic, no evidence injury, no tenderness, facial buttresses intact without stepoff. Face/sinus: No tenderness to palpation and percussion. Facial movement is normal and symmetric. Eyes: PERRL, EOMI. No scleral icterus, conjunctivae clear. Neuro: CN II exam reveals vision grossly intact.  No nystagmus at any point of gaze. Ears: Auricles well formed without lesions.  Ear canals are intact without mass or lesion.  No erythema or edema is appreciated.  The TMs are intact without fluid. Nose: External evaluation reveals normal support and skin without lesions.  Dorsum is intact.  Anterior rhinoscopy reveals congested mucosa over anterior aspect of inferior turbinates and intact septum.  No purulence noted. Oral:  Oral cavity and oropharynx are intact, symmetric, without erythema or edema.  Mucosa is moist without lesions. Neck: Full range of motion without pain.  There is no significant lymphadenopathy.  No masses palpable.  Thyroid  bed within normal limits to  palpation.  Parotid glands and submandibular glands equal bilaterally without mass.  Trachea is midline. Neuro:  CN 2-12 grossly intact. Vestibular: No nystagmus at any point of gaze. Dix Hallpike negative. Vestibular: There is no nystagmus with pneumatic pressure on either tympanic membrane or Valsalva. The cerebellar examination is unremarkable.   Assessment: 1.  The patient's recurrent dizziness and right benign paroxysmal positional vertigo are currently under control.  His Dix-Hallpike maneuver is negative today. 2.  Chronic rhinitis with nasal mucosal congestion, nasal septal deviation, and bilateral inferior turbinate hypertrophy.  The severity of his nasal congestion has decreased.  Plan: 1.  The physical exam findings are reviewed with the patient. 2.  The patient is reassured that his BPPV has resolved. 3.  Continue with Flonase  nasal spray and Claritin . 4.  The patient will return for reevaluation in 6 months.

## 2024-02-22 ENCOUNTER — Ambulatory Visit (HOSPITAL_COMMUNITY): Payer: Self-pay | Admitting: Pharmacist

## 2024-02-22 DIAGNOSIS — Z7901 Long term (current) use of anticoagulants: Secondary | ICD-10-CM | POA: Diagnosis not present

## 2024-02-22 LAB — POCT INR: INR: 2.3 (ref 2.0–3.0)

## 2024-02-24 ENCOUNTER — Encounter: Payer: Self-pay | Admitting: Cardiology

## 2024-02-24 ENCOUNTER — Ambulatory Visit: Attending: Cardiology | Admitting: Cardiology

## 2024-02-24 VITALS — BP 124/0 | HR 82 | Ht 69.5 in | Wt 196.0 lb

## 2024-02-24 DIAGNOSIS — I5020 Unspecified systolic (congestive) heart failure: Secondary | ICD-10-CM

## 2024-02-24 DIAGNOSIS — Z95811 Presence of heart assist device: Secondary | ICD-10-CM

## 2024-02-24 NOTE — Patient Instructions (Addendum)
 Medication Instructions:  Your physician recommends that you continue on your current medications as directed. Please refer to the Current Medication list given to you today.  Labwork: none  Testing/Procedures: none  Follow-Up: Your physician recommends that you schedule a follow-up appointment in: January 2026  Any Other Special Instructions Will Be Listed Below (If Applicable).  If you need a refill on your cardiac medications before your next appointment, please call your pharmacy.

## 2024-02-24 NOTE — Progress Notes (Signed)
 Cardiology Office Note  Date: 02/24/2024   ID: WYETH HOFFER, DOB [birth year], MRN [id number]  History of Present Illness: Ricardo Zuniga is a 73 y.o. male that I last saw in November 2024.  He had a recent visit with Dr. Gardenia in June, I reviewed the note.  He had LVAD follow-up at that time with normal function and no interval alarms.  Amlodipine  2.5 mg daily was added at that time given elevated blood pressures.  He will have a follow-up echocardiogram at his next heart failure clinic visit.  He is here for a follow-up visit, continues to do very well, NYHA class I dyspnea.  Still participating in cardiac rehabilitation.  He does not report any significant fluid retention, has used a single Lasix  in the last 3 to 4 months.  His weight is stable.  He does not report any sudden palpitations or syncope.  We went over his medications.  He remains on Coumadin  with follow-up in the anticoagulation clinic, most recent INR 2.3.  Physical Exam: VS:  BP (!) 124/0   Pulse 82   Ht 5' 9.5 (1.765 m)   Wt 196 lb (88.9 kg)   SpO2 99%   BMI 28.53 kg/m , BMI Body mass index is 28.53 kg/m.  Wt Readings from Last 3 Encounters:  02/24/24 196 lb (88.9 kg)  01/26/24 196 lb 6.4 oz (89.1 kg)  01/13/24 178 lb (80.7 kg)    General: Patient appears comfortable at rest. HEENT: Conjunctiva and lids normal. Neck: Supple, no elevated JVP. Lungs: Clear to auscultation, nonlabored breathing at rest. Cardiac: LVAD hum. Extremities: No pitting edema.  ECG:  An ECG dated 10/13/2023 was personally reviewed today and demonstrated:  Sinus rhythm with PVCs, electrical lead interference secondary to LVAD.  Labwork: 09/29/2023: Magnesium  2.1 10/11/2023: ALT 26; AST 22 01/26/2024: BUN 16; Creatinine, Ser 0.83; Hemoglobin 15.8; Platelets 208; Potassium 4.3; Sodium 136     Component Value Date/Time   CHOL 134 09/02/2022 0547   TRIG 75 09/02/2022 0547   HDL 34 (L) 09/02/2022 0547   CHOLHDL 3.9 09/02/2022  0547   VLDL 15 09/02/2022 0547   LDLCALC 85 09/02/2022 0547   Other Studies Reviewed Today:  Echocardiogram 10/14/2023:  1. Inlet cannula with no septal apposition or color flow turbulence  Abnoraml septal motion with global hypokinesis No RAMP study performed.  Left ventricular ejection fraction, by estimation, is 20%. The left  ventricle has severely decreased function.  The left ventricle demonstrates global hypokinesis. The left ventricular  internal cavity size was severely dilated. Left ventricular diastolic  parameters are indeterminate.   2. Right ventricular systolic function was not well visualized. The right  ventricular size is not well visualized.   3. Left atrial size was moderately dilated.   4. The mitral valve is abnormal. Trivial mitral valve regurgitation.   5. Limited systolic excursion with LVAD . The aortic valve is tricuspid.  There is moderate calcification of the aortic valve. There is moderate  thickening of the aortic valve. Aortic valve regurgitation is not  visualized. Aortic valve sclerosis is  present, with no evidence of aortic valve stenosis.   6. The inferior vena cava is normal in size with <50% respiratory  variability, suggesting right atrial pressure of 8 mmHg.   Assessment and Plan:  1.  HFrEF with nonischemic cardiomyopathy, LVEF approximately 20% status post Heartmate III implantation as destination therapy in February 2024. He is on Coumadin  with follow-up in the anticoagulation clinic.  Interval  echocardiogram in March reviewed above, he continues to follow with Dr. Gardenia in the heart failure clinic.  Remains clinically quite stable, NYHA class I dyspnea and no fluid retention.  He will have a follow-up echocardiogram at his next heart failure clinic visit.  Continue cardiac rehabilitation as before.  He is currently on Norvasc  2.5 mg daily, digoxin  62.5 mcg daily, Jardiance  10 mg daily, Coumadin  with follow-up in anticoagulation clinic, and as  needed Lasix  which he is only rarely using.   2. Atrial tachycardia and NSVT, taken off of Amiodarone  and clinically stable this point.  No palpitations or syncope.   3. Nonobstructive CAD.  No active angina.  Continue Lipitor  40 mg daily.  Disposition:  Follow up 6 months.  Signed, Jayson JUDITHANN Sierras, M.D., F.A.C.C. [facility]

## 2024-02-29 ENCOUNTER — Ambulatory Visit (HOSPITAL_COMMUNITY): Payer: Self-pay | Admitting: Pharmacist

## 2024-02-29 LAB — POCT INR: INR: 2.3 (ref 2.0–3.0)

## 2024-03-07 ENCOUNTER — Ambulatory Visit (HOSPITAL_COMMUNITY): Payer: Self-pay | Admitting: Pharmacist

## 2024-03-07 LAB — POCT INR: INR: 2.4 (ref 2.0–3.0)

## 2024-03-14 ENCOUNTER — Ambulatory Visit (HOSPITAL_COMMUNITY): Payer: Self-pay | Admitting: Pharmacist

## 2024-03-14 LAB — POCT INR: INR: 2 (ref 2.0–3.0)

## 2024-03-20 ENCOUNTER — Other Ambulatory Visit (HOSPITAL_COMMUNITY): Payer: Self-pay | Admitting: Cardiology

## 2024-03-21 ENCOUNTER — Ambulatory Visit (HOSPITAL_COMMUNITY): Payer: Self-pay | Admitting: Pharmacist

## 2024-03-21 DIAGNOSIS — Z7901 Long term (current) use of anticoagulants: Secondary | ICD-10-CM | POA: Diagnosis not present

## 2024-03-21 LAB — POCT INR: INR: 2.3 (ref 2.0–3.0)

## 2024-03-24 DIAGNOSIS — Z1339 Encounter for screening examination for other mental health and behavioral disorders: Secondary | ICD-10-CM | POA: Diagnosis not present

## 2024-03-24 DIAGNOSIS — Z1331 Encounter for screening for depression: Secondary | ICD-10-CM | POA: Diagnosis not present

## 2024-03-24 DIAGNOSIS — I509 Heart failure, unspecified: Secondary | ICD-10-CM | POA: Diagnosis not present

## 2024-03-24 DIAGNOSIS — I10 Essential (primary) hypertension: Secondary | ICD-10-CM | POA: Diagnosis not present

## 2024-03-24 DIAGNOSIS — I429 Cardiomyopathy, unspecified: Secondary | ICD-10-CM | POA: Diagnosis not present

## 2024-03-24 DIAGNOSIS — Z7189 Other specified counseling: Secondary | ICD-10-CM | POA: Diagnosis not present

## 2024-03-24 DIAGNOSIS — I5020 Unspecified systolic (congestive) heart failure: Secondary | ICD-10-CM | POA: Diagnosis not present

## 2024-03-24 DIAGNOSIS — E119 Type 2 diabetes mellitus without complications: Secondary | ICD-10-CM | POA: Diagnosis not present

## 2024-03-24 DIAGNOSIS — Z0000 Encounter for general adult medical examination without abnormal findings: Secondary | ICD-10-CM | POA: Diagnosis not present

## 2024-03-28 ENCOUNTER — Ambulatory Visit (HOSPITAL_COMMUNITY): Payer: Self-pay | Admitting: Pharmacist

## 2024-03-28 LAB — POCT INR: INR: 2.3 (ref 2.0–3.0)

## 2024-04-04 ENCOUNTER — Ambulatory Visit (HOSPITAL_COMMUNITY): Payer: Self-pay | Admitting: Pharmacist

## 2024-04-04 LAB — POCT INR: INR: 2.1 (ref 2.0–3.0)

## 2024-04-05 ENCOUNTER — Other Ambulatory Visit (HOSPITAL_COMMUNITY): Payer: Self-pay

## 2024-04-05 ENCOUNTER — Encounter (HOSPITAL_COMMUNITY): Admitting: Cardiology

## 2024-04-05 DIAGNOSIS — Z95811 Presence of heart assist device: Secondary | ICD-10-CM

## 2024-04-05 DIAGNOSIS — Z7901 Long term (current) use of anticoagulants: Secondary | ICD-10-CM

## 2024-04-06 ENCOUNTER — Ambulatory Visit (HOSPITAL_COMMUNITY)
Admission: RE | Admit: 2024-04-06 | Discharge: 2024-04-06 | Disposition: A | Discharge status: Home / Self Care | Source: Ambulatory Visit | Attending: Cardiology

## 2024-04-06 ENCOUNTER — Ambulatory Visit (HOSPITAL_BASED_OUTPATIENT_CLINIC_OR_DEPARTMENT_OTHER)
Admission: RE | Admit: 2024-04-06 | Discharge: 2024-04-06 | Disposition: A | Discharge status: Home / Self Care | Source: Ambulatory Visit | Attending: Cardiology | Admitting: Cardiology

## 2024-04-06 VITALS — BP 95/83 | HR 82 | Wt 193.4 lb

## 2024-04-06 DIAGNOSIS — Z7901 Long term (current) use of anticoagulants: Secondary | ICD-10-CM | POA: Insufficient documentation

## 2024-04-06 DIAGNOSIS — I5022 Chronic systolic (congestive) heart failure: Secondary | ICD-10-CM

## 2024-04-06 DIAGNOSIS — Z4509 Encounter for adjustment and management of other cardiac device: Secondary | ICD-10-CM | POA: Insufficient documentation

## 2024-04-06 DIAGNOSIS — Z79899 Other long term (current) drug therapy: Secondary | ICD-10-CM

## 2024-04-06 DIAGNOSIS — Z5181 Encounter for therapeutic drug level monitoring: Secondary | ICD-10-CM

## 2024-04-06 DIAGNOSIS — I110 Hypertensive heart disease with heart failure: Secondary | ICD-10-CM | POA: Diagnosis not present

## 2024-04-06 DIAGNOSIS — Z95811 Presence of heart assist device: Secondary | ICD-10-CM

## 2024-04-06 DIAGNOSIS — I5023 Acute on chronic systolic (congestive) heart failure: Secondary | ICD-10-CM | POA: Insufficient documentation

## 2024-04-06 DIAGNOSIS — E119 Type 2 diabetes mellitus without complications: Secondary | ICD-10-CM | POA: Diagnosis not present

## 2024-04-06 DIAGNOSIS — E785 Hyperlipidemia, unspecified: Secondary | ICD-10-CM | POA: Insufficient documentation

## 2024-04-06 DIAGNOSIS — I5020 Unspecified systolic (congestive) heart failure: Secondary | ICD-10-CM | POA: Diagnosis not present

## 2024-04-06 LAB — CBC
HCT: 45.4 % (ref 39.0–52.0)
Hemoglobin: 16.1 g/dL (ref 13.0–17.0)
MCH: 30 pg (ref 26.0–34.0)
MCHC: 35.5 g/dL (ref 30.0–36.0)
MCV: 84.5 fL (ref 80.0–100.0)
Platelets: 226 K/uL (ref 150–400)
RBC: 5.37 MIL/uL (ref 4.22–5.81)
RDW: 14.6 % (ref 11.5–15.5)
WBC: 6.9 K/uL (ref 4.0–10.5)
nRBC: 0 % (ref 0.0–0.2)

## 2024-04-06 LAB — COMPREHENSIVE METABOLIC PANEL WITH GFR
ALT: 24 U/L (ref 0–44)
AST: 24 U/L (ref 15–41)
Albumin: 4.4 g/dL (ref 3.5–5.0)
Alkaline Phosphatase: 74 U/L (ref 38–126)
Anion gap: 16 — ABNORMAL HIGH (ref 5–15)
BUN: 19 mg/dL (ref 8–23)
CO2: 19 mmol/L — ABNORMAL LOW (ref 22–32)
Calcium: 9.8 mg/dL (ref 8.9–10.3)
Chloride: 100 mmol/L (ref 98–111)
Creatinine, Ser: 0.92 mg/dL (ref 0.61–1.24)
GFR, Estimated: >60 mL/min (ref >60)
Glucose, Bld: 110 mg/dL — ABNORMAL HIGH (ref 70–99)
Potassium: 3.8 mmol/L (ref 3.5–5.1)
Sodium: 135 mmol/L (ref 135–145)
Total Bilirubin: 1.1 mg/dL (ref 0.0–1.2)
Total Protein: 8 g/dL (ref 6.5–8.1)

## 2024-04-06 LAB — LACTATE DEHYDROGENASE: LDH: 267 U/L — ABNORMAL HIGH (ref 98–192)

## 2024-04-06 LAB — PREALBUMIN: Prealbumin: 24 mg/dL (ref 18–38)

## 2024-04-06 LAB — PROTIME-INR
INR: 1.9 — ABNORMAL HIGH (ref 0.8–1.2)
Prothrombin Time: 22.4 s — ABNORMAL HIGH (ref 11.4–15.2)

## 2024-04-06 LAB — MAGNESIUM: Magnesium: 2.1 mg/dL (ref 1.7–2.4)

## 2024-04-06 NOTE — Progress Notes (Addendum)
 Patient presents for 2 mo f/u and 1.5 year Intermacs in VAD Clinic today with his wife Shanda. Reports no problems with VAD equipment or concerns with drive line.  Patient states he has been feeling well since his last visit. Denies lightheadedness falls, shortness of breath and signs of bleeding. Pt has intermittent dizziness that is at baseline. Continues to follow with Dr. Karis for vertigo.   Patient continues to remain active in the cardiac rehab maintenance program at [facility].   Echocardiogram performed prior to visit. Dr. Gardenia reviewed results with pt during today's visit.  Pt complaining of increased diarrhea for approximately 1 month. He is not taking any stool softeners at this time. Pt has seen GI in the past for IBS. Advised by Dr. Gardenia to cut out artifical sweeteners from diet as they can increase GI upset.  Pt complaining of increased joint pain. Advised to seek care with PCP if it does not improve with OTC Tylenol .    Vital Signs:  Automatic BP: 116/99 (106) Repeat BP taken 15 minutes later: 95/83 (90) Doppler Pressure: 100 HR: 82 SPO2: 96%    Weight: 193.4 lbs lb w/ eqt Last weight: 196.4 lb w/ eqt  VAD Indication: Destination Therapy for age   VAD interrogation & Equipment Management: Speed: 5500 Flow: 4.0 Power: 4.1w    PI: 3.8   Alarms: none Events: 0-20 PI events  Fixed speed 5500 Low speed limit: 5200   Primary Controller:  Replace back up battery in 16 months. Back up controller:  Replace back up battery in 16 months.   Annual Equipment Maintenance on UBC/PM was performed on 09/29/23.    I reviewed the LVAD parameters from today and compared the results to the patient's prior recorded data. LVAD interrogation was NEGATIVE for significant power changes, NEGATIVE for clinical alarms and STABLE for PI events/speed drops. No programming changes were made and pump is functioning within specified parameters. Pt is performing daily controller and  system monitor self tests along with completing weekly and monthly maintenance for LVAD equipment.   LVAD equipment check completed and is in good working order. Back-up equipment present.   Patient completed 1400 feet during 6 minute walk test. Tolerated well.   Neurocognitive trail making completed correctly in 1 m 59  s.   [place] Cardiomyopathy, EQ-5D-3L and post-VAD QOL completed by the patient independently.  [place] Cardiomyopathy Questionnaire     04/06/2024    4:36 PM 09/29/2023    2:13 PM 05/25/2023   11:37 AM  KCCQ-12  1 a. Ability to shower/bathe Moderately limited Other, Did not do Not at all limited  1 b. Ability to walk 1 block Not at all limited Not at all limited Not at all limited  1 c. Ability to hurry/jog Quite a bit limited Slightly limited Not at all limited  2. Edema feet/ankles/legs Never over the past 2 weeks Never over the past 2 weeks Never over the past 2 weeks  3. Limited by fatigue Less than once a week Never over the past 2 weeks Never over the past 2 weeks  4. Limited by dyspnea Never over the past 2 weeks Never over the past 2 weeks Never over the past 2 weeks  5. Sitting up / on 3+ pillows Never over the past 2 weeks Never over the past 2 weeks Never over the past 2 weeks  6. Limited enjoyment of life Slightly limited Not limited at all Not limited at all  7. Rest of  life w/ symptoms Completely satisfied Completely satisfied Completely satisfied  8 a. Participation in hobbies Slightly limited Slightly limited Slightly limited  8 b. Participation in chores Slightly limited Did not limit at all Did not limit at all  8 c. Visiting family/friends Did not limit at all Did not limit at all Did not limit at all   Exit Site Care: CDI. Existing VAD CDI. Anchor secure. Being maintained weekly with a daily kit. Denies redness, tenderness or drainage at exit site. Pt has begun to wear VAD shirt at night to prevent trauma. 8 daily kits and 8 anchors and  Tegaderm given to patient at today's appointment for home use.  Device: none  BP & Labs: Doppler 100 - Doppler is reflecting MAP   Hgb  - No S/S of bleeding. Specifically denies melena/BRBPR or nosebleeds.   LDH stable at 182 with established baseline of 170 - 208. Denies tea-colored urine. No power elevations noted on interrogation.   Plan: No medication canges Return in 2 months for follow up with Dr. Gardenia Stop drinking sugar free drinks 4. Coumadin  dosing per [facility]  Schuyler Lunger RN,BSN VAD Coordinator  Office: [phone]  24/7 Pager: [phone]

## 2024-04-06 NOTE — Patient Instructions (Signed)
 No medication canges Return in 2 months for follow up with Dr. Gardenia Stop drinking sugar free drinks 4. Coumadin  dosing per [facility]

## 2024-04-07 LAB — DIGOXIN LEVEL: Digoxin Level: <0.6 ng/mL — ABNORMAL LOW (ref 0.8–2.0)

## 2024-04-07 LAB — ECHOCARDIOGRAM LIMITED
Est EF: 20
S' Lateral: 4.7 cm

## 2024-04-12 ENCOUNTER — Ambulatory Visit (HOSPITAL_COMMUNITY): Payer: Self-pay | Admitting: Pharmacist

## 2024-04-12 LAB — POCT INR: INR: 2.3 (ref 2.0–3.0)

## 2024-04-18 ENCOUNTER — Ambulatory Visit (HOSPITAL_COMMUNITY): Payer: Self-pay | Admitting: Pharmacist

## 2024-04-18 DIAGNOSIS — Z7901 Long term (current) use of anticoagulants: Secondary | ICD-10-CM | POA: Diagnosis not present

## 2024-04-18 LAB — POCT INR: INR: 2.2 (ref 2.0–3.0)

## 2024-04-18 NOTE — Progress Notes (Signed)
 ADVANCED HEART FAILURE CLINIC NOTE  Referring Physician: Rosamond Leta NOVAK, MD  Primary Care: Rosamond Leta NOVAK, MD Primary Cardiologist: Waldon Sierras Heart Failure: Ria Commander  CC: Stage D / End-stage heart failure s/p HMIII LVAD  HPI: Ricardo Zuniga is a very pleasant 73 YO WM w/ HFrEF 2/2 nonischemic cardiomyopathy, T2DM and hx of acute promyelocytic leukemia s/p ATRA ([facility] 2014) that was initially see at [facility] in October 2023 after presenting with a 1 week history of orthopnea, PND & LE edema. He had a TTE prior to admission w/ LVEF of 40-45%. Echo during admit was notable for LVEF of 30%-35%. LHC/RHC during admit w/ nonobstructive CAD & moderately reduced CI (2.1 L/min/m2). His cardiac history dates back to 2015 when he was found to have an LVEF of 25% felt to be triggered by endocarditis. He had improvement in LV function for several years, however, unfortunately had a fairly rapid decline in LVEF and functional status over the winter months of 2023. The decision was made to proceed with HMIII implantation. [name] underwent successful implant on 09/24/22 as DT. His post-operative course was fairly unremarkable. He had rapid improvement in PA pressures after HMIII implantation.   Interval hx: - He has been doing very well since his last visit from a functional standpoint.  Recently took a trip to the beach with his family. - Compliant with all medications.  No  shortness of breath, lower extremity edema. - Driveline site well-managed. - He recently saw Dr. Karis where Epley maneuver was positive.  Since that time he has been on Valium  as needed.  Reports that he is unable to tolerate meclizine   Current Outpatient Medications  Medication Sig Dispense Refill   ACCU-CHEK GUIDE TEST test strip      acetaminophen  (TYLENOL ) 650 MG CR tablet Take 650-1,300 mg by mouth every 8 (eight) hours as needed for pain.     amLODipine  (NORVASC ) 2.5 MG tablet Take 1 tablet (2.5 mg  total) by mouth daily. 90 tablet 3   atorvastatin  (LIPITOR ) 40 MG tablet Take 1 tablet by mouth once daily 90 tablet 0   Cholecalciferol  (VITAMIN D3) 125 MCG (5000 UT) TABS Take 5,000 Units by mouth in the morning.     diazepam  (VALIUM ) 5 MG tablet Take 5 mg by mouth daily as needed.     Digoxin  62.5 MCG TABS Take 1 tablet by mouth daily. 90 tablet 3   docusate sodium  (COLACE) 100 MG capsule Take 100 mg by mouth every evening.     empagliflozin  (JARDIANCE ) 10 MG TABS tablet Take 10 mg by mouth every evening.     furosemide  (LASIX ) 20 MG tablet Take 1 tablet (20 mg total) by mouth as needed.     omeprazole (PRILOSEC OTC) 20 MG tablet Take 20 mg by mouth daily.     repaglinide  (PRANDIN ) 2 MG tablet Take 2-4 mg by mouth See admin instructions. Take 2 tablets (4 mg) by mouth in the morning & take 1 tablet (2 mg) by mouth in the evening.     sodium fluoride (DENTA 5000 PLUS) 1.1 % CREA dental cream Place 1 Application onto teeth every evening.     traZODone  (DESYREL ) 100 MG tablet TAKE 1 TABLET BY MOUTH AT BEDTIME AS NEEDED FOR  SLEEP 90 tablet 3   warfarin (COUMADIN ) 5 MG tablet TAKE ONE TABLET (5 MG) DAILY OR AS DIRECTED BY HF CLINIC 100 tablet 3   zinc sulfate, 50mg  elemental zinc, 220 (50 Zn)  MG capsule Take 220 mg by mouth daily.     No current facility-administered medications for this encounter.    PHYSICAL EXAM:  Vital Signs:  Automatic BP: 108/94 (100) Repeat: 96/57 (91) Doppler Pressure: 88 HR: 82 SPO2: 97%    Weight: 196.4 lbs lb w/ eqt Last weight: 191.6 lb w/ eqt     VAD Indication: Destination Therapy for age   VAD interrogation & Equipment Management: Speed: 5500 Flow: 4 Power: 4.1 PI: 3.8  Alarms: none Events: 0-20  Fixed speed 5500 Low speed limit: 5200 Personally reviewed/interrogated device.   PHYSICAL EXAM:  Vitals:   04/06/24 1613 04/06/24 1614  BP: (!) 116/99 95/83  Pulse: 82   SpO2: 96%    GENERAL: NAD Lungs- CTA CARDIAC:  JVP: 7 cm           Normal LVAD hum, no edema.  ABDOMEN: Soft, non-tender, non-distended.  EXTREMITIES: Warm and well perfused.  NEUROLOGIC: No obvious FND    ECHO: - Echo (06/08/22): EF 30-35%, global HK, RV mildly reduced, mild MR   - Echo (7/23): EF 45%, RV normal   - Echo (8/22): EF 40-45%, RV normal   - Echo (7/21): EF 50%, RV normal  - LHC (09/2018): Moderate nonobstructive one-vessel coronary artery disease, prox RCA 40% stenosed.   - Echo (08/2018): EF 40%, RV normal  - Echo (05/2016): EF 35-40%, RV normal   - Echo (05/2014): EF 25-30%, RV mildly reduced  - Echo (05/2012): EF 50%, RV normal  - Echo (1/24): EF 25%, severe LV dilation, global hypokinesis, moderate RV dysfunction, mild RV enlargement, moderate AI, mild-moderate MR. - Echo (11/27/22): HMIII LVAD in place. Ramp study from 5500RPM-5800RPM; trivial MR throughout. Septum midline. RV enlarged but with preserved function.   CATH: R/LHC (10/23): nonobstructive CAD RA 8 PA 52/25 (34 mean) PCWP 14 (mean) CO/CI (Fick): 4.3/2.1                                 PVR 4.6 WU          PAPi 3.4             RHC (1/24): RA mean 10 PA 69/28, mean 43 PCWP mean 20 CI 1.85 PVR 6.22 WU PAPi 4.1     ASSESSMENT & PLAN:  1. Chronic systolic heart failure s/p HMIII Destination therapy - Significant improvement in functional status since HMIII placement.  - NYHA II functional status; PI events 0-20. No syncope or lightheadedness.  - Continue digoxin  62.5 mcg daily. Digoxin  level <0.6.  - Continue jardiance  10mg  daily.  - Euvolemic on exam; continue lasix  20mg  PRN; has not required lasix  dosing for several weeks.  - Continue amlodipine  2.5mg  daily.  - INR 1.9; discussed with pharmD.  - LDH mildly elevated at 267; sCr normal at 0.92.    2. Hx of atrial tach, PVCs & NSVT -Resolved on Zio patch.  Amiodarone  discontinued -Telemetry stable.   3. Hx of promyelocytic leukemia: S/P ATRA in 2014 at [facility].   4. Hx of endocarditis: 2015  5.  Hypertension - continue  amlodipine  2.5mg  daily.   6. Dizziness - Positive epley maneuver with Dr. Karis. Valium  PRN.   I reviewed the LVAD parameters from today and compared the results to the patient's prior recorded data. LVAD interrogation was NEGATIVE for significant power changes, NEGATIVE for clinical alarms and STABLE for PI events/speed drops. No programming changes were made and pump is  functioning within specified parameters. Pt is performing daily controller and system monitor self tests along with completing weekly and monthly maintenance for LVAD equipment.   I spent 60 minutes caring for this patient today including face to face time, ordering and reviewing labs, reviewing records, reviewing LVAD interrogation/driveline, seeing the patient, documenting in the record, and arranging follow ups.   Rosamond Andress 04/18/2024 3:01 PM

## 2024-04-25 ENCOUNTER — Ambulatory Visit (HOSPITAL_COMMUNITY): Payer: Self-pay | Admitting: Pharmacist

## 2024-04-25 LAB — POCT INR: INR: 2.4 (ref 2.0–3.0)

## 2024-05-02 ENCOUNTER — Ambulatory Visit (HOSPITAL_COMMUNITY): Payer: Self-pay | Admitting: Pharmacist

## 2024-05-02 LAB — POCT INR: INR: 1.9 — AB (ref 2.0–3.0)

## 2024-05-04 DIAGNOSIS — H26493 Other secondary cataract, bilateral: Secondary | ICD-10-CM | POA: Diagnosis not present

## 2024-05-09 ENCOUNTER — Ambulatory Visit (HOSPITAL_COMMUNITY): Payer: Self-pay | Admitting: Pharmacist

## 2024-05-09 LAB — POCT INR: INR: 2.3 (ref 2.0–3.0)

## 2024-05-16 ENCOUNTER — Ambulatory Visit (HOSPITAL_COMMUNITY): Payer: Self-pay | Admitting: Pharmacist

## 2024-05-16 DIAGNOSIS — E119 Type 2 diabetes mellitus without complications: Secondary | ICD-10-CM | POA: Diagnosis not present

## 2024-05-16 DIAGNOSIS — Z7901 Long term (current) use of anticoagulants: Secondary | ICD-10-CM | POA: Diagnosis not present

## 2024-05-16 LAB — POCT INR: INR: 2.4 (ref 2.0–3.0)

## 2024-05-23 ENCOUNTER — Ambulatory Visit (HOSPITAL_COMMUNITY): Payer: Self-pay | Admitting: Pharmacist

## 2024-05-23 LAB — POCT INR: INR: 2.2 (ref 2.0–3.0)

## 2024-05-30 ENCOUNTER — Ambulatory Visit (HOSPITAL_COMMUNITY): Payer: Self-pay | Admitting: Pharmacist

## 2024-05-30 LAB — POCT INR: INR: 1.9 — AB (ref 2.0–3.0)

## 2024-06-03 ENCOUNTER — Other Ambulatory Visit (HOSPITAL_COMMUNITY): Payer: Self-pay | Admitting: *Deleted

## 2024-06-03 DIAGNOSIS — Z95811 Presence of heart assist device: Secondary | ICD-10-CM

## 2024-06-03 DIAGNOSIS — Z7901 Long term (current) use of anticoagulants: Secondary | ICD-10-CM

## 2024-06-06 ENCOUNTER — Ambulatory Visit (HOSPITAL_COMMUNITY)
Admission: RE | Admit: 2024-06-06 | Discharge: 2024-06-06 | Disposition: A | Discharge status: Home / Self Care | Source: Ambulatory Visit | Attending: Cardiology | Admitting: Cardiology

## 2024-06-06 ENCOUNTER — Ambulatory Visit (HOSPITAL_COMMUNITY): Payer: Self-pay | Admitting: Pharmacist

## 2024-06-06 ENCOUNTER — Ambulatory Visit (HOSPITAL_COMMUNITY): Payer: Self-pay | Admitting: Cardiology

## 2024-06-06 DIAGNOSIS — Z7901 Long term (current) use of anticoagulants: Secondary | ICD-10-CM | POA: Diagnosis not present

## 2024-06-06 DIAGNOSIS — I2510 Atherosclerotic heart disease of native coronary artery without angina pectoris: Secondary | ICD-10-CM | POA: Diagnosis not present

## 2024-06-06 DIAGNOSIS — I5020 Unspecified systolic (congestive) heart failure: Secondary | ICD-10-CM | POA: Insufficient documentation

## 2024-06-06 DIAGNOSIS — I110 Hypertensive heart disease with heart failure: Secondary | ICD-10-CM | POA: Insufficient documentation

## 2024-06-06 DIAGNOSIS — I5084 End stage heart failure: Secondary | ICD-10-CM | POA: Diagnosis not present

## 2024-06-06 DIAGNOSIS — E119 Type 2 diabetes mellitus without complications: Secondary | ICD-10-CM | POA: Insufficient documentation

## 2024-06-06 DIAGNOSIS — I428 Other cardiomyopathies: Secondary | ICD-10-CM | POA: Insufficient documentation

## 2024-06-06 DIAGNOSIS — Z856 Personal history of leukemia: Secondary | ICD-10-CM | POA: Insufficient documentation

## 2024-06-06 DIAGNOSIS — Z7984 Long term (current) use of oral hypoglycemic drugs: Secondary | ICD-10-CM | POA: Diagnosis not present

## 2024-06-06 DIAGNOSIS — Z8679 Personal history of other diseases of the circulatory system: Secondary | ICD-10-CM | POA: Insufficient documentation

## 2024-06-06 DIAGNOSIS — H8110 Benign paroxysmal vertigo, unspecified ear: Secondary | ICD-10-CM | POA: Diagnosis not present

## 2024-06-06 DIAGNOSIS — Z95811 Presence of heart assist device: Secondary | ICD-10-CM

## 2024-06-06 DIAGNOSIS — I5022 Chronic systolic (congestive) heart failure: Secondary | ICD-10-CM | POA: Insufficient documentation

## 2024-06-06 DIAGNOSIS — R5383 Other fatigue: Secondary | ICD-10-CM | POA: Insufficient documentation

## 2024-06-06 DIAGNOSIS — Z79899 Other long term (current) drug therapy: Secondary | ICD-10-CM | POA: Diagnosis not present

## 2024-06-06 LAB — BASIC METABOLIC PANEL WITH GFR
Anion gap: 14 (ref 5–15)
BUN: 15 mg/dL (ref 8–23)
CO2: 20 mmol/L — ABNORMAL LOW (ref 22–32)
Calcium: 9.2 mg/dL (ref 8.9–10.3)
Chloride: 104 mmol/L (ref 98–111)
Creatinine, Ser: 0.95 mg/dL (ref 0.61–1.24)
GFR, Estimated: >60 mL/min (ref >60)
Glucose, Bld: 107 mg/dL — ABNORMAL HIGH (ref 70–99)
Potassium: 4.2 mmol/L (ref 3.5–5.1)
Sodium: 138 mmol/L (ref 135–145)

## 2024-06-06 LAB — CBC
HCT: 42.9 % (ref 39.0–52.0)
Hemoglobin: 15.3 g/dL (ref 13.0–17.0)
MCH: 29.5 pg (ref 26.0–34.0)
MCHC: 35.7 g/dL (ref 30.0–36.0)
MCV: 82.7 fL (ref 80.0–100.0)
Platelets: 196 K/uL (ref 150–400)
RBC: 5.19 MIL/uL (ref 4.22–5.81)
RDW: 14.1 % (ref 11.5–15.5)
WBC: 7.3 K/uL (ref 4.0–10.5)
nRBC: 0 % (ref 0.0–0.2)

## 2024-06-06 LAB — POCT INR: INR: 2.1 (ref 2.0–3.0)

## 2024-06-06 LAB — PROTIME-INR
INR: 1.9 — ABNORMAL HIGH (ref 0.8–1.2)
Prothrombin Time: 23.2 s — ABNORMAL HIGH (ref 11.4–15.2)

## 2024-06-06 LAB — DIGOXIN LEVEL: Digoxin Level: <0.6 ng/mL — ABNORMAL LOW (ref 0.8–2.0)

## 2024-06-06 LAB — LACTATE DEHYDROGENASE: LDH: 201 U/L — ABNORMAL HIGH (ref 98–192)

## 2024-06-06 NOTE — Progress Notes (Signed)
 Patient presents for 2 mo f/u and 1.5 year Intermacs in VAD Clinic today with his wife Shanda. Reports no problems with VAD equipment or concerns with drive line.  Patient states he has been feeling well since his last visit. Denies lightheadedness falls, shortness of breath and signs of bleeding.   Patient continues to remain active in the cardiac rehab maintenance program at [facility].    Vital Signs:  Ht: 5'9 Automatic BP: 102/82 (91) Doppler Pressure: 100 HR: 90 SPO2: 98%    Weight: 195.8 lbs lb w/ eqt Last weight: 193.4 lb w/ eqt  VAD Indication: Destination Therapy for age   VAD interrogation & Equipment Management: Speed: 5500 Flow: 4.0 Power: 4.3w    PI: 3.9   Alarms: none Events: 10-20 PI events  Fixed speed 5500 Low speed limit: 5200   Primary Controller:  Replace back up battery in 14 months. Back up controller:  Replace back up battery in 14 months.   Annual Equipment Maintenance on UBC/PM was performed on 09/29/23.    I reviewed the LVAD parameters from today and compared the results to the patient's prior recorded data. LVAD interrogation was NEGATIVE for significant power changes, NEGATIVE for clinical alarms and STABLE for PI events/speed drops. No programming changes were made and pump is functioning within specified parameters. Pt is performing daily controller and system monitor self tests along with completing weekly and monthly maintenance for LVAD equipment.   LVAD equipment check completed and is in good working order. Back-up equipment present.    Exit Site Care: CDI. Existing VAD CDI. Anchor secure. Being maintained weekly with a daily kit. Denies redness, tenderness or drainage at exit site. Pt has begun to wear VAD shirt at night to prevent trauma. 12 daily kits, 12 anchors, and Tegaderm given to patient at today's appointment for home use.  Device: none  BP & Labs: Doppler 100 - Doppler is reflecting MAP   Hgb 15.3 - No S/S of bleeding.  Specifically denies melena/BRBPR or nosebleeds.   LDH stable at 201 with established baseline of 170 - 208. Denies tea-colored urine. No power elevations noted on interrogation.   Plan: No medication canges Return in 2 months for follow up with Dr. Rolan 3. Coumadin  dosing per [facility]  Geofm Christmas RN,BSN VAD Coordinator  Office: [phone]  24/7 Pager: [phone]

## 2024-06-06 NOTE — Progress Notes (Signed)
 ADVANCED HEART FAILURE CLINIC NOTE  Referring Physician: Rosamond Leta NOVAK, MD  Primary Care: Ricardo Leta NOVAK, MD Primary Cardiologist: Ricardo Zuniga  CC: Stage D / End-stage heart failure s/p HMIII LVAD  HPI: Ricardo Zuniga is a very pleasant 73 YO WM w/ HFrEF 2/2 nonischemic cardiomyopathy, T2DM and hx of acute promyelocytic leukemia s/p ATRA ([facility] 2014) that was initially see at [facility] in October 2023 after presenting with a 1 week history of orthopnea, PND & LE edema. He had a TTE prior to admission w/ LVEF of 40-45%. Echo during admit was notable for LVEF of 30%-35%. LHC/RHC during admit w/ nonobstructive CAD & moderately reduced CI (2.1 L/min/m2). His cardiac history dates back to 2015 when he was found to have an LVEF of 25% felt to be triggered by endocarditis. He had improvement in LV function for several years, however, unfortunately had a fairly rapid decline in LVEF and functional status over the winter months of 2023. The decision was made to proceed with HMIII implantation. Ricardo Zuniga underwent successful implant on 09/24/22 as DT. His post-operative course was fairly unremarkable. He had rapid improvement in PA pressures after HMIII implantation.   Echo in 8/25 showed EF 20% with midline septum, aortic valve does not open (has been oversewn), moderate RV enlargement and moderate RV systolic dysfunction.   He has been doing well recently.  He is doing maintenance cardiac rehab.  No significant dyspnea with usual activities. Fatigues relatively easily but really has no limitations that concern him.  No lightheadedness.  No palpitations. MAP 91 today.  Weight up 2 lbs. No recent vertigo symptoms.   Labs (8/25): K 3.8, hgb 16.1, creatinine 0.92, LDH 267.    Current Outpatient Medications  Medication Sig Dispense Refill   ACCU-CHEK GUIDE TEST test strip      acetaminophen  (TYLENOL ) 650 MG CR tablet Take 650-1,300 mg by mouth every 8 (eight) hours as needed for pain.      amLODipine  (NORVASC ) 2.5 MG tablet Take 1 tablet (2.5 mg total) by mouth daily. 90 tablet 3   atorvastatin  (LIPITOR ) 40 MG tablet Take 1 tablet by mouth once daily 90 tablet 0   Cholecalciferol  (VITAMIN D3) 125 MCG (5000 UT) TABS Take 5,000 Units by mouth in the morning.     diazepam  (VALIUM ) 5 MG tablet Take 5 mg by mouth daily as needed.     Digoxin  62.5 MCG TABS Take 1 tablet by mouth daily. 90 tablet 3   docusate sodium  (COLACE) 100 MG capsule Take 100 mg by mouth every evening.     empagliflozin  (JARDIANCE ) 10 MG TABS tablet Take 10 mg by mouth every evening.     omeprazole (PRILOSEC OTC) 20 MG tablet Take 20 mg by mouth daily.     repaglinide  (PRANDIN ) 2 MG tablet Take 2-4 mg by mouth See admin instructions. Take 2 tablets (4 mg) by mouth in the morning & take 1 tablet (2 mg) by mouth in the evening.     sodium fluoride (DENTA 5000 PLUS) 1.1 % CREA dental cream Place 1 Application onto teeth every evening.     traZODone  (DESYREL ) 100 MG tablet TAKE 1 TABLET BY MOUTH AT BEDTIME AS NEEDED FOR  SLEEP 90 tablet 3   warfarin (COUMADIN ) 5 MG tablet TAKE ONE TABLET (5 MG) DAILY OR AS DIRECTED BY HF CLINIC 100 tablet 3   zinc sulfate, 50mg  elemental zinc, 220 (50 Zn) MG capsule Take 220 mg by mouth daily.  furosemide  (LASIX ) 20 MG tablet Take 1 tablet (20 mg total) by mouth as needed. (Patient not taking: Reported on 06/06/2024)     No current facility-administered medications for this encounter.    PHYSICAL EXAM:  Vital Signs:  BP 102/82 Comment: MAP 91  Pulse 90   Ht 5' 9 (1.753 m)   Wt 88.8 kg (195 lb 12.8 oz)   SpO2 98%   BMI 28.91 kg/m    VAD Indication: Destination Therapy for age   VAD interrogation & Equipment Management: LVAD Documentation    06/06/2024  Device Info  LVAD Type: Heartmate II  Therapy Type: Destination Therapy      06/06/2024  Vitals  Heart Rate: 90 BPM  Automatic BP: 102/82  Doppler MAP: 91 mmHg  SpO2: 98 %    Last 3 Weights Weight Weight   06/06/2024 88.814 kg 195 lb 12.8 oz  04/06/2024 87.726 kg 193 lb 6.4 oz  02/24/2024 88.905 kg 196 lb       06/06/2024  LVAD Paramaters  Speed: 5500 RPM  Flow: 4 LPM  PI: 4  Power: 4 Watts  Hematocrit: 43 %  Alarms: none  Events: 10 - 20 per day  Last Speed Change Date: 09/29/2023  Last Ramp Echo Date: 09/29/2023  Last Right Heart Cath Date: 09/22/2022  Bleeding History: No  Type of Dressing: Weekly  Annual Maintenance Date: 09/29/2023    Labs    Units 06/06/24 1344 06/06/24 0000 05/30/24 0000 04/12/24 0000 04/06/24 1534 02/01/24 0000 01/26/24 1059  INR  1.9* 2.1 1.9*   < > 1.9*   < > 1.9*  LDH U/L 201*  --   --   --  267*  --  182  HGB g/dL 84.6  --   --   --  83.8  --  15.8  CREATININE mg/dL 9.04  --   --   --  9.07  --  0.83   < > = values in this interval not displayed.        General: Well appearing this am. NAD.  HEENT: Normal. Neck: Supple, JVP 7-8 cm. Carotids OK.  Cardiac:  Mechanical heart sounds with LVAD hum present.  Lungs:  CTAB, normal effort.  Abdomen:  NT, ND, no HSM. No bruits or masses. +BS  LVAD exit site: Well-healed and incorporated. Dressing dry and intact. No erythema or drainage. Stabilization device present and accurately applied. Driveline dressing changed daily per sterile technique. Extremities:  Warm and dry. No cyanosis, clubbing, rash, or edema.  Neuro:  Alert & oriented x 3. Cranial nerves grossly intact. Moves all 4 extremities w/o difficulty. Affect pleasant     ECHO: - Echo (06/08/22): EF 30-35%, global HK, RV mildly reduced, mild MR   - Echo (7/23): EF 45%, RV normal   - Echo (8/22): EF 40-45%, RV normal   - Echo (7/21): EF 50%, RV normal  - LHC (09/2018): Moderate nonobstructive one-vessel coronary artery disease, prox RCA 40% stenosed.   - Echo (08/2018): EF 40%, RV normal  - Echo (05/2016): EF 35-40%, RV normal   - Echo (05/2014): EF 25-30%, RV mildly reduced  - Echo (05/2012): EF 50%, RV normal  - Echo (1/24): EF 25%, severe  LV dilation, global hypokinesis, moderate RV dysfunction, mild RV enlargement, moderate AI, mild-moderate MR. - Echo (11/27/22): HMIII LVAD in place. Ramp study from 5500RPM-5800RPM; trivial MR throughout. Septum midline. RV enlarged but with preserved function.  - Echo (8/25): EF 20% with midline septum, aortic valve does  not open (has been oversewn), moderate RV enlargement and moderate RV systolic dysfunction. LVAD present.   CATH: R/LHC (10/23): nonobstructive CAD RA 8 PA 52/25 (34 mean) PCWP 14 (mean) CO/CI (Fick): 4.3/2.1                                 PVR 4.6 WU          PAPi 3.4             RHC (1/24): RA mean 10 PA 69/28, mean 43 PCWP mean 20 CI 1.85 PVR 6.22 WU PAPi 4.1     ASSESSMENT & PLAN:  1. Chronic systolic heart failure s/p HMIII Destination therapy: Nonischemic cardiomyopathy.  Significant improvement in functional status since HMIII placement. Aortic valve was oversewn.  NYHA class II symptoms, not volume overloaded on exam.  LVAD parameters reviewed today and stable.  MAP mildly elevated at 91. - Continue digoxin  62.5 mcg daily. Check level today.  - Continue jardiance  10mg  daily.  - He has not required Lasix .  - Continue amlodipine  2.5 mg daily.  - Continue warfarin, check INR today.  - BMET, LDH today.   2. H/o atrial tachycardia, PVCs & NSVT: Now off amiodarone , no palpitations.  3. Hx of promyelocytic leukemia: S/P [facility]TRA in 2014 at [facility]  4. Hx of endocarditis: 2015 5. Hypertension: MAP mildly elevated today.  - continue  amlodipine  2.5mg  daily.  6. BPPV: No recent vertigo.   Followup in 2 months.   I reviewed the LVAD parameters from today and compared the results to the patient's prior recorded data. LVAD interrogation was NEGATIVE for significant power changes, NEGATIVE for clinical alarms and STABLE for PI events/speed drops. No programming changes were made and pump is functioning within specified parameters. Pt is performing daily controller and  system monitor self tests along with completing weekly and monthly maintenance for LVAD equipment.   I spent 41 minutes caring for this patient today including face to face time, ordering and reviewing labs, reviewing records, reviewing LVAD interrogation/driveline, seeing the patient, documenting in the record, and arranging follow ups.   Ezra Shuck 06/06/2024

## 2024-06-13 ENCOUNTER — Ambulatory Visit (HOSPITAL_COMMUNITY): Payer: Self-pay | Admitting: Pharmacist

## 2024-06-13 ENCOUNTER — Encounter (HOSPITAL_COMMUNITY): Payer: Self-pay | Admitting: Pharmacist

## 2024-06-13 DIAGNOSIS — Z7901 Long term (current) use of anticoagulants: Secondary | ICD-10-CM | POA: Diagnosis not present

## 2024-06-13 LAB — POCT INR: INR: 1.7 — AB (ref 2.0–3.0)

## 2024-06-20 ENCOUNTER — Encounter (HOSPITAL_COMMUNITY): Payer: Self-pay | Admitting: Pharmacist

## 2024-06-20 ENCOUNTER — Ambulatory Visit (HOSPITAL_COMMUNITY): Payer: Self-pay | Admitting: Pharmacist

## 2024-06-20 LAB — POCT INR: INR: 2.1 (ref 2.0–3.0)

## 2024-06-22 ENCOUNTER — Other Ambulatory Visit: Payer: Self-pay | Admitting: Cardiology

## 2024-06-22 DIAGNOSIS — Z7901 Long term (current) use of anticoagulants: Secondary | ICD-10-CM

## 2024-06-22 DIAGNOSIS — Z95811 Presence of heart assist device: Secondary | ICD-10-CM

## 2024-06-23 MED ORDER — DIGOXIN 62.5 MCG PO TABS
1.0000 | ORAL_TABLET | Freq: Every day | ORAL | 3 refills | Status: DC
Start: 2024-06-23 — End: 2024-06-27

## 2024-06-27 ENCOUNTER — Telehealth: Payer: Self-pay | Admitting: Cardiology

## 2024-06-27 ENCOUNTER — Ambulatory Visit (HOSPITAL_COMMUNITY): Payer: Self-pay | Admitting: Pharmacist

## 2024-06-27 ENCOUNTER — Other Ambulatory Visit: Payer: Self-pay

## 2024-06-27 DIAGNOSIS — Z95811 Presence of heart assist device: Secondary | ICD-10-CM

## 2024-06-27 DIAGNOSIS — Z7901 Long term (current) use of anticoagulants: Secondary | ICD-10-CM

## 2024-06-27 LAB — POCT INR: INR: 1.9 — AB (ref 2.0–3.0)

## 2024-06-27 NOTE — Telephone Encounter (Signed)
*  STAT* If patient is at the pharmacy, call can be transferred to refill team.   1. Which medications need to be refilled? (please list name of each medication and dose if known) amoxicillin  500mg  cap  2. Which pharmacy/location (including street and city if local pharmacy) is medication to be sent to? [facility] [address] [place] - [address]    3. Do they need a 30 day or 90 day supply? 4

## 2024-06-28 MED ORDER — AMOXICILLIN 500 MG PO CAPS: 2000.0000 mg | ORAL_CAPSULE | Freq: Once | ORAL | 0 refills | Status: DC

## 2024-06-30 MED ORDER — ATORVASTATIN CALCIUM 40 MG PO TABS: 40.0000 mg | ORAL_TABLET | Freq: Every day | ORAL | 2 refills | Status: AC

## 2024-06-30 NOTE — Telephone Encounter (Signed)
 Pt of Dr. Debera.  These RX's haven't been prescribed or refilled by Dr. Debera. Does Dr. Debera want to refill? Please advise.

## 2024-07-01 MED ORDER — DIGOXIN 62.5 MCG PO TABS
1.0000 | ORAL_TABLET | Freq: Every day | ORAL | 3 refills | Status: AC
Start: 2024-07-01 — End: ?

## 2024-07-01 MED ORDER — AMOXICILLIN 500 MG PO CAPS: 2000.0000 mg | ORAL_CAPSULE | Freq: Once | ORAL | 0 refills | Status: AC

## 2024-07-04 ENCOUNTER — Ambulatory Visit (HOSPITAL_COMMUNITY): Payer: Self-pay | Admitting: Pharmacist

## 2024-07-04 LAB — POCT INR: INR: 2.2 (ref 2.0–3.0)

## 2024-07-11 ENCOUNTER — Ambulatory Visit (HOSPITAL_COMMUNITY): Payer: Self-pay | Admitting: Pharmacist

## 2024-07-11 DIAGNOSIS — Z7901 Long term (current) use of anticoagulants: Secondary | ICD-10-CM | POA: Diagnosis not present

## 2024-07-11 LAB — POCT INR: INR: 2.2 (ref 2.0–3.0)

## 2024-07-18 ENCOUNTER — Ambulatory Visit (HOSPITAL_COMMUNITY): Payer: Self-pay | Admitting: Pharmacist

## 2024-07-18 LAB — POCT INR: INR: 2.2 (ref 2.0–3.0)

## 2024-07-25 ENCOUNTER — Ambulatory Visit (HOSPITAL_COMMUNITY): Payer: Self-pay | Admitting: Pharmacist

## 2024-07-25 LAB — POCT INR: INR: 2.1 (ref 2.0–3.0)

## 2024-08-01 ENCOUNTER — Other Ambulatory Visit (HOSPITAL_COMMUNITY): Payer: Self-pay

## 2024-08-01 ENCOUNTER — Other Ambulatory Visit: Payer: Self-pay

## 2024-08-01 ENCOUNTER — Ambulatory Visit (HOSPITAL_COMMUNITY): Payer: Self-pay | Admitting: Pharmacist

## 2024-08-01 DIAGNOSIS — Z7901 Long term (current) use of anticoagulants: Secondary | ICD-10-CM

## 2024-08-01 DIAGNOSIS — Z95811 Presence of heart assist device: Secondary | ICD-10-CM

## 2024-08-01 LAB — POCT INR: INR: 2.4 (ref 2.0–3.0)

## 2024-08-01 MED ORDER — TRAZODONE HCL 100 MG PO TABS: 100.0000 mg | ORAL_TABLET | Freq: Every evening | ORAL | 3 refills | Status: AC | PRN

## 2024-08-08 ENCOUNTER — Ambulatory Visit (HOSPITAL_COMMUNITY): Payer: Self-pay | Admitting: Pharmacist

## 2024-08-08 ENCOUNTER — Ambulatory Visit (HOSPITAL_COMMUNITY)

## 2024-08-08 ENCOUNTER — Ambulatory Visit (HOSPITAL_COMMUNITY): Payer: Self-pay | Admitting: Cardiology

## 2024-08-08 ENCOUNTER — Ambulatory Visit (HOSPITAL_COMMUNITY)
Admission: RE | Admit: 2024-08-08 | Discharge: 2024-08-08 | Disposition: A | Discharge status: Home / Self Care | Source: Ambulatory Visit | Attending: Cardiology | Admitting: Cardiology

## 2024-08-08 DIAGNOSIS — Z7984 Long term (current) use of oral hypoglycemic drugs: Secondary | ICD-10-CM | POA: Insufficient documentation

## 2024-08-08 DIAGNOSIS — I428 Other cardiomyopathies: Secondary | ICD-10-CM | POA: Diagnosis not present

## 2024-08-08 DIAGNOSIS — E119 Type 2 diabetes mellitus without complications: Secondary | ICD-10-CM | POA: Insufficient documentation

## 2024-08-08 DIAGNOSIS — Z4509 Encounter for adjustment and management of other cardiac device: Secondary | ICD-10-CM | POA: Insufficient documentation

## 2024-08-08 DIAGNOSIS — I5084 End stage heart failure: Secondary | ICD-10-CM | POA: Insufficient documentation

## 2024-08-08 DIAGNOSIS — Z856 Personal history of leukemia: Secondary | ICD-10-CM | POA: Insufficient documentation

## 2024-08-08 DIAGNOSIS — Z79899 Other long term (current) drug therapy: Secondary | ICD-10-CM | POA: Diagnosis not present

## 2024-08-08 DIAGNOSIS — Z95811 Presence of heart assist device: Secondary | ICD-10-CM | POA: Insufficient documentation

## 2024-08-08 DIAGNOSIS — I110 Hypertensive heart disease with heart failure: Secondary | ICD-10-CM | POA: Insufficient documentation

## 2024-08-08 DIAGNOSIS — Z9221 Personal history of antineoplastic chemotherapy: Secondary | ICD-10-CM | POA: Insufficient documentation

## 2024-08-08 DIAGNOSIS — Z7901 Long term (current) use of anticoagulants: Secondary | ICD-10-CM | POA: Diagnosis not present

## 2024-08-08 DIAGNOSIS — I5022 Chronic systolic (congestive) heart failure: Secondary | ICD-10-CM | POA: Diagnosis not present

## 2024-08-08 LAB — BASIC METABOLIC PANEL WITH GFR
Anion gap: 14 (ref 5–15)
BUN: 18 mg/dL (ref 8–23)
CO2: 23 mmol/L (ref 22–32)
Calcium: 9.6 mg/dL (ref 8.9–10.3)
Chloride: 99 mmol/L (ref 98–111)
Creatinine, Ser: 0.77 mg/dL (ref 0.61–1.24)
GFR, Estimated: >60 mL/min
Glucose, Bld: 88 mg/dL (ref 70–99)
Potassium: 4.3 mmol/L (ref 3.5–5.1)
Sodium: 136 mmol/L (ref 135–145)

## 2024-08-08 LAB — CBC
HCT: 44.8 % (ref 39.0–52.0)
Hemoglobin: 16.2 g/dL (ref 13.0–17.0)
MCH: 30.3 pg (ref 26.0–34.0)
MCHC: 36.2 g/dL — ABNORMAL HIGH (ref 30.0–36.0)
MCV: 83.9 fL (ref 80.0–100.0)
Platelets: 200 K/uL (ref 150–400)
RBC: 5.34 MIL/uL (ref 4.22–5.81)
RDW: 14.2 % (ref 11.5–15.5)
WBC: 7 K/uL (ref 4.0–10.5)
nRBC: 0 % (ref 0.0–0.2)

## 2024-08-08 LAB — POCT INR: INR: 1.9 — AB (ref 2.0–3.0)

## 2024-08-08 LAB — LACTATE DEHYDROGENASE: LDH: 290 U/L — ABNORMAL HIGH (ref 105–235)

## 2024-08-08 LAB — PROTIME-INR
INR: 1.9 — ABNORMAL HIGH (ref 0.8–1.2)
Prothrombin Time: 22.8 s — ABNORMAL HIGH (ref 11.4–15.2)

## 2024-08-08 LAB — DIGOXIN LEVEL: Digoxin Level: 0.7 ng/mL — ABNORMAL LOW (ref 0.8–2.0)

## 2024-08-08 NOTE — Progress Notes (Signed)
 Patient presents for 2 mo f/u in V[facility] Clinic today with his wife Shanda. Reports no problems with VAD equipment or concerns with drive line.  Patient states he has been feeling well since his last visit. Denies lightheadedness falls, shortness of breath and signs of bleeding.   Patient continues to remain active in the cardiac rehab maintenance program at [facility].   Reports arthritic pain in his hands. Advised by Dr. Rolan to follow up with Ortho.   Vital Signs:  Ht: 5'9 Automatic BP: 104/76 (86) Doppler Pressure: 104 HR: 80 SPO2: 97%    Weight: 191.6 lbs lb w/ eqt Home weight: 182.8 w/o eqt Last weight: 195.8 lb w/ eqt  VAD Indication: Destination Therapy for age   VAD interrogation & Equipment Management: Speed: 5550 Flow: 3.9 Power: 4.3w    PI: 4.1   Alarms: none Events: 10-30 PI events  Fixed speed 5500 Low speed limit: 5200   Primary Controller:  Replace back up battery in 12 months. Back up controller:  Replace back up battery in 12 months.   Annual Equipment Maintenance on UBC/PM was performed on 09/29/23.    I reviewed the LVAD parameters from today and compared the results to the patient's prior recorded data. LVAD interrogation was NEGATIVE for significant power changes, NEGATIVE for clinical alarms and STABLE for PI events/speed drops. No programming changes were made and pump is functioning within specified parameters. Pt is performing daily controller and system monitor self tests along with completing weekly and monthly maintenance for LVAD equipment.   LVAD equipment check completed and is in good working order. Back-up equipment present.    Exit Site Care: CDI. Existing VAD CDI. Anchor secure. Being maintained weekly with a daily kit. Denies redness, tenderness or drainage at exit site. Pt has begun to wear VAD shirt at night to prevent trauma. 12 daily kits, 12 anchors, and Tegaderm given to patient at today's appointment for home use.  Device:  none  BP & Labs: Doppler 104 - Doppler is reflecting modified systolic   Hgb 16.2 - No S/S of bleeding. Specifically denies melena/BRBPR or nosebleeds.   LDH stable at 290 with established baseline of 170 - 208. Denies tea-colored urine. No power elevations noted on interrogation.   Plan: No medication canges Return in 2 months for follow up with Dr. Rolan 3. Coumadin  dosing per [facility]  Schuyler Lunger RN, BSN VAD Coordinator 24/7 Pager [phone]

## 2024-08-08 NOTE — Progress Notes (Signed)
 "  ADVANCED HEART FAILURE CLINIC NOTE  Referring Physician: Rosamond Leta NOVAK, MD  Primary Care: Ricardo Leta NOVAK, MD Primary Cardiologist: Ricardo Zuniga  CC: Stage D / End-stage heart failure s/p HMIII LVAD  HPI: Ricardo Zuniga is a very pleasant 73 YO WM w/ HFrEF 2/2 nonischemic cardiomyopathy, T2DM and hx of acute promyelocytic leukemia s/p ATRA ([facility] 2014) that was initially see at [facility] in October 2023 after presenting with a 1 week history of orthopnea, PND & LE edema. He had a TTE prior to admission w/ LVEF of 40-45%. Echo during admit was notable for LVEF of 30%-35%. LHC/RHC during admit w/ nonobstructive CAD & moderately reduced CI (2.1 L/min/m2). His cardiac history dates back to 2015 when he was found to have an LVEF of 25% felt to be triggered by endocarditis. He had improvement in LV function for several years, however, unfortunately had a fairly rapid decline in LVEF and functional status over the winter months of 2023. The decision was made to proceed with HMIII implantation. [name] underwent successful implant on 09/24/22 as DT. His post-operative course was fairly unremarkable. He had rapid improvement in PA pressures after HMIII implantation.   Echo in 8/25 showed EF 20% with midline septum, aortic valve does not open (has been oversewn), moderate RV enlargement and moderate RV systolic dysfunction.   Patient has been stable symptomatically.  Staying active, doing maintenance cardiac rehab.  Mild fatigue with climbing a flight of stairs but can get up the stairs without stopping.  No exertional dyspnea walking on flat ground or with usual ADLs.  Most limited by chronic joint pain. No lightheadedness/falls.  No BRBPR/melena. Driveline site looks benign.   Labs (8/25): K 3.8, hgb 16.1, creatinine 0.92, LDH 267.  Labs (10/25): K 4.2, creatinine 0.95, hgb 15.3   Current Outpatient Medications  Medication Sig Dispense Refill   ACCU-CHEK GUIDE TEST test strip       acetaminophen  (TYLENOL ) 650 MG CR tablet Take 650-1,300 mg by mouth every 8 (eight) hours as needed for pain.     amLODipine  (NORVASC ) 2.5 MG tablet Take 1 tablet (2.5 mg total) by mouth daily. 90 tablet 3   atorvastatin  (LIPITOR ) 40 MG tablet Take 1 tablet (40 mg total) by mouth daily. 90 tablet 2   Cholecalciferol  (VITAMIN D3) 125 MCG (5000 UT) TABS Take 5,000 Units by mouth in the morning.     diazepam  (VALIUM ) 5 MG tablet Take 5 mg by mouth daily as needed.     Digoxin  62.5 MCG TABS Take 1 tablet by mouth daily. 90 tablet 3   docusate sodium  (COLACE) 100 MG capsule Take 100 mg by mouth every evening.     empagliflozin  (JARDIANCE ) 10 MG TABS tablet Take 10 mg by mouth every evening.     furosemide  (LASIX ) 20 MG tablet Take 1 tablet (20 mg total) by mouth as needed.     omeprazole (PRILOSEC OTC) 20 MG tablet Take 20 mg by mouth daily.     repaglinide  (PRANDIN ) 2 MG tablet Take 2-4 mg by mouth See admin instructions. Take 2 tablets (4 mg) by mouth in the morning & take 1 tablet (2 mg) by mouth in the evening.     sodium fluoride (DENTA 5000 PLUS) 1.1 % CREA dental cream Place 1 Application onto teeth every evening.     traZODone  (DESYREL ) 100 MG tablet Take 1 tablet (100 mg total) by mouth at bedtime as needed. for sleep 90 tablet 3   warfarin (  COUMADIN ) 5 MG tablet TAKE ONE TABLET (5 MG) DAILY OR AS DIRECTED BY HF CLINIC 100 tablet 3   zinc sulfate, 50mg  elemental zinc, 220 (50 Zn) MG capsule Take 220 mg by mouth daily.     No current facility-administered medications for this encounter.    PHYSICAL EXAM:  Vital Signs:  BP (!) 104/0   Pulse 80   Wt 86.9 kg (191 lb 9.6 oz)   SpO2 97%   BMI 28.29 kg/m    VAD Indication: Destination Therapy for age   VAD interrogation & Equipment Management: LVAD Documentation    08/08/2024  Device Info  LVAD Type: Heartmate III  Date of Implant: 09/24/2022  Therapy Type: Destination Therapy      08/08/2024  Vitals  Heart Rate: 80 BPM   Automatic BP: 104/76  Doppler MAP: 104 mmHg  SpO2: 97 %    Last 3 Weights Weight Weight  08/08/2024 86.909 kg 191 lb 9.6 oz  06/06/2024 88.814 kg 195 lb 12.8 oz  04/06/2024 87.726 kg 193 lb 6.4 oz       08/08/2024  LVAD Paramaters  Speed: 5550 RPM  Flow: 4 LPM  PI: 4  Power: 4 Watts  Hematocrit: 45 %  Alarms: none  Events: 10-30  Last Speed Change Date: 09/29/2023  Last Ramp Echo Date: 09/29/2023  Last Right Heart Cath Date: 09/22/2022  Bleeding History: No  Type of Dressing: Weekly  Annual Maintenance Date: 09/29/2023    Labs    Units 08/08/24 1421 08/08/24 0000 08/01/24 0000 06/13/24 0000 06/06/24 1344 04/12/24 0000 04/06/24 1534  INR  1.9* 1.9* 2.4   < > 1.9*   < > 1.9*  LDH U/L 290*  --   --   --  201*  --  267*  HGB g/dL 83.7  --   --   --  84.6  --  16.1  CREATININE mg/dL 9.22  --   --   --  9.04  --  0.92   < > = values in this interval not displayed.       General: Well appearing this am. NAD.  HEENT: Normal. Neck: Supple, JVP 7-8 cm. Carotids OK.  Cardiac:  Mechanical heart sounds with LVAD hum present.  Lungs:  CTAB, normal effort.  Abdomen:  NT, ND, no HSM. No bruits or masses. +BS  LVAD exit site: Well-healed and incorporated. Dressing dry and intact. No erythema or drainage. Stabilization device present and accurately applied. Driveline dressing changed daily per sterile technique. Extremities:  Warm and dry. No cyanosis, clubbing, rash, or edema.  Neuro:  Alert & oriented x 3. Cranial nerves grossly intact. Moves all 4 extremities w/o difficulty. Affect pleasant    ECHO: - Echo (06/08/22): EF 30-35%, global HK, RV mildly reduced, mild MR   - Echo (7/23): EF 45%, RV normal   - Echo (8/22): EF 40-45%, RV normal   - Echo (7/21): EF 50%, RV normal  - LHC (09/2018): Moderate nonobstructive one-vessel coronary artery disease, prox RCA 40% stenosed.   - Echo (08/2018): EF 40%, RV normal  - Echo (05/2016): EF 35-40%, RV normal   - Echo (05/2014): EF  25-30%, RV mildly reduced  - Echo (05/2012): EF 50%, RV normal  - Echo (1/24): EF 25%, severe LV dilation, global hypokinesis, moderate RV dysfunction, mild RV enlargement, moderate AI, mild-moderate MR. - Echo (11/27/22): HMIII LVAD in place. Ramp study from 5500RPM-5800RPM; trivial MR throughout. Septum midline. RV enlarged but with preserved function.  - Echo (  8/25): EF 20% with midline septum, aortic valve does not open (has been oversewn), moderate RV enlargement and moderate RV systolic dysfunction. LVAD present.   CATH: R/LHC (10/23): nonobstructive CAD RA 8 PA 52/25 (34 mean) PCWP 14 (mean) CO/CI (Fick): 4.3/2.1                                 PVR 4.6 WU          PAPi 3.4             RHC (1/24): RA mean 10 PA 69/28, mean 43 PCWP mean 20 CI 1.85 PVR 6.22 WU PAPi 4.1     ASSESSMENT & PLAN:  1. Chronic systolic heart failure s/p HMIII Destination therapy: Nonischemic cardiomyopathy.  Significant improvement in functional status since HMIII placement. Aortic valve was oversewn.  NYHA class II symptoms, not volume overloaded on exam.  LVAD parameters reviewed today and stable, has a few PI events daily.  MAP stable at 86.  - Continue digoxin  62.5 mcg daily. Check level today.  - Continue jardiance  10mg  daily.  - He has not required Lasix .  - Continue amlodipine  2.5 mg daily.  - Continue warfarin, check INR today.  - BMET/LDH today.    2. H/o atrial tachycardia, PVCs & NSVT: Now off amiodarone , no palpitations.  3. Hx of promyelocytic leukemia: S/P ATRA in 2014 at [facility].  4. Hx of endocarditis: 2015 5. Hypertension: MAP controlled today.   - continue  amlodipine  2.5mg  daily.  6. BPPV: No recent vertigo.   Followup in 2 months.   I reviewed the LVAD parameters from today and compared the results to the patient's prior recorded data. LVAD interrogation was NEGATIVE for significant power changes, NEGATIVE for clinical alarms and STABLE for PI events/speed drops. No programming  changes were made and pump is functioning within specified parameters. Pt is performing daily controller and system monitor self tests along with completing weekly and monthly maintenance for LVAD equipment.   I spent 42 minutes caring for this patient today including face to face time, ordering and reviewing labs, reviewing records, reviewing LVAD interrogation/driveline, seeing the patient, documenting in the record, and arranging follow ups.   Ezra Shuck 08/08/2024  "

## 2024-08-08 NOTE — Patient Instructions (Signed)
 No medication changes Return to VAD Clinic in 2 months for follow up with Dr. Rolan. Please bring your equipment and wear tennis shoes for your 2 year visit.

## 2024-08-15 ENCOUNTER — Ambulatory Visit (HOSPITAL_COMMUNITY): Payer: Self-pay | Admitting: Pharmacist

## 2024-08-15 LAB — POCT INR: INR: 2.3 (ref 2.0–3.0)

## 2024-08-22 ENCOUNTER — Ambulatory Visit (HOSPITAL_COMMUNITY): Payer: Self-pay | Admitting: Pharmacist

## 2024-08-22 LAB — POCT INR: INR: 2.2 (ref 2.0–3.0)

## 2024-08-25 ENCOUNTER — Encounter (INDEPENDENT_AMBULATORY_CARE_PROVIDER_SITE_OTHER): Payer: Self-pay | Admitting: Otolaryngology

## 2024-08-25 ENCOUNTER — Ambulatory Visit (INDEPENDENT_AMBULATORY_CARE_PROVIDER_SITE_OTHER): Admitting: Otolaryngology

## 2024-08-25 VITALS — BP 80/55 | HR 83 | Ht 69.5 in | Wt 184.6 lb

## 2024-08-25 DIAGNOSIS — J343 Hypertrophy of nasal turbinates: Secondary | ICD-10-CM

## 2024-08-25 DIAGNOSIS — R42 Dizziness and giddiness: Secondary | ICD-10-CM | POA: Diagnosis not present

## 2024-08-25 DIAGNOSIS — J342 Deviated nasal septum: Secondary | ICD-10-CM

## 2024-08-25 DIAGNOSIS — J310 Chronic rhinitis: Secondary | ICD-10-CM

## 2024-08-25 NOTE — Progress Notes (Signed)
 Patient ID: Ricardo Zuniga, male   DOB: 1950-11-24, 74 y.o.   MRN: [id number]  Follow up: Recurrent dizziness, chronic nasal congestion, chronic rhinitis  History of Present Illness Ricardo Zuniga is a 74 year old male with recurrent dizziness and chronic rhinitis with nasal septal deviation who presents for otolaryngology follow-up.  Over the past six months, he has experienced approximately six episodes of spinning vertigo, each lasting a few minutes, with the most recent episode occurring this morning. The vertigo is described as a spinning sensation, sometimes accompanied by imbalance and lightheadedness, particularly with positional changes. The episodes resolve spontaneously, and he manages symptoms by sitting down and relaxing. He does not experience prolonged or severe symptoms and is able to manage at home. He finds that using a Valium  helps him relax during more intense episodes.  He has chronic nasal congestion that has improved significantly. He exclusively uses AYR saline gel for nasal symptoms, which he finds highly effective, and no longer uses intranasal corticosteroids or other nasal sprays. He is able to breathe well with this regimen. He has a known deviated septum and bilateral inferior turbinate hypertrophy, and previously discussed surgical intervention, but has not pursued it.  Exam: General: Communicates without difficulty, well nourished, no acute distress. Head: Normocephalic, no evidence injury, no tenderness, facial buttresses intact without stepoff. Face/sinus: No tenderness to palpation and percussion. Facial movement is normal and symmetric. Eyes: PERRL, EOMI. No scleral icterus, conjunctivae clear. Neuro: CN II exam reveals vision grossly intact.  No nystagmus at any point of gaze. Ears: Auricles well formed without lesions.  Ear canals are intact without mass or lesion.  No erythema or edema is appreciated.  The TMs are intact without fluid. Nose: External  evaluation reveals normal support and skin without lesions.  Dorsum is intact.  Anterior rhinoscopy reveals congested mucosa over anterior aspect of inferior turbinates and deviated septum.  No purulence noted. Oral:  Oral cavity and oropharynx are intact, symmetric, without erythema or edema.  Mucosa is moist without lesions. Neck: Full range of motion without pain.  There is no significant lymphadenopathy.  No masses palpable.  Thyroid  bed within normal limits to palpation.  Parotid glands and submandibular glands equal bilaterally without mass.  Trachea is midline. Neuro:  CN 2-12 grossly intact.  Vestibular: No nystagmus at any point of gaze. Dix Hallpike negative. Vestibular: There is no nystagmus with pneumatic pressure on either tympanic membrane or Valsalva. The cerebellar examination is unremarkable.    Assessment and Plan Assessment & Plan Recurrent dizziness He experiences intermittent, brief episodes of vertigo with spontaneous resolution and no significant functional impairment. Dix-Hallpike maneuver was negative, indicating minimal otolith displacement.  - Recurrent dizziness. The possible differential diagnoses include transient BPPV, vestibular migraine, Meniere's disease, peripheral vestibular dysfunction, or other central/systemic causes.   - The pathophysiology of vestibular dysfunction and dizziness are discussed extensively with the patient. The possible differential diagnoses are reviewed. Questions are invited and answered.   - Performed Dix-Hallpike maneuver to evaluate for BPPV. - Recommended continued self-management during vertigo episodes, including sitting and relaxing. - Advised follow-up in six months or earlier if symptoms worsen.  Chronic rhinitis with nasal mucosal congestion, nasal septal deviation, and bilateral inferior turbinate hypertrophy He reports significant improvement in nasal congestion with AYR saline gel and has discontinued fluticasone . Examination  revealed a deviated septum and mild turbinate hypertrophy, but congestion is not severe.  - Recommended continued use of AYR saline gel for nasal congestion. - Deferred surgical  intervention due to improvement in symptoms and cardiac comorbidities.

## 2024-08-29 ENCOUNTER — Ambulatory Visit (HOSPITAL_COMMUNITY): Payer: Self-pay | Admitting: Pharmacist

## 2024-08-29 LAB — POCT INR: INR: 1.9 — AB (ref 2.0–3.0)

## 2024-09-05 ENCOUNTER — Ambulatory Visit (HOSPITAL_COMMUNITY): Payer: Self-pay | Admitting: Pharmacist

## 2024-09-05 LAB — POCT INR: INR: 1.8 — AB (ref 2.0–3.0)

## 2024-09-12 ENCOUNTER — Ambulatory Visit (HOSPITAL_COMMUNITY): Payer: Self-pay | Admitting: Pharmacist

## 2024-09-12 LAB — POCT INR: INR: 1.9 — AB (ref 2.0–3.0)

## 2024-10-03 ENCOUNTER — Ambulatory Visit: Admitting: Cardiology

## 2024-10-06 ENCOUNTER — Ambulatory Visit (HOSPITAL_COMMUNITY): Admitting: Cardiology

## 2025-02-23 ENCOUNTER — Ambulatory Visit (INDEPENDENT_AMBULATORY_CARE_PROVIDER_SITE_OTHER): Admitting: Otolaryngology
